# Patient Record
Sex: Female | Born: 1980
Health system: Southern US, Community
[De-identification: ages and names within clinical notes are randomized; demographics above are authoritative.]

## PROBLEM LIST (undated history)

## (undated) DIAGNOSIS — K760 Fatty (change of) liver, not elsewhere classified: Secondary | ICD-10-CM

## (undated) DIAGNOSIS — K219 Gastro-esophageal reflux disease without esophagitis: Secondary | ICD-10-CM

## (undated) DIAGNOSIS — F1190 Opioid use, unspecified, uncomplicated: Secondary | ICD-10-CM

## (undated) DIAGNOSIS — F429 Obsessive-compulsive disorder, unspecified: Secondary | ICD-10-CM

## (undated) DIAGNOSIS — F411 Generalized anxiety disorder: Secondary | ICD-10-CM

## (undated) DIAGNOSIS — F419 Anxiety disorder, unspecified: Secondary | ICD-10-CM

## (undated) DIAGNOSIS — L409 Psoriasis, unspecified: Secondary | ICD-10-CM

## (undated) DIAGNOSIS — F410 Panic disorder [episodic paroxysmal anxiety] without agoraphobia: Secondary | ICD-10-CM

## (undated) DIAGNOSIS — F09 Unspecified mental disorder due to known physiological condition: Secondary | ICD-10-CM

## (undated) DIAGNOSIS — Z8619 Personal history of other infectious and parasitic diseases: Secondary | ICD-10-CM

## (undated) DIAGNOSIS — M21371 Foot drop, right foot: Secondary | ICD-10-CM

## (undated) DIAGNOSIS — D649 Anemia, unspecified: Secondary | ICD-10-CM

## (undated) DIAGNOSIS — R002 Palpitations: Secondary | ICD-10-CM

## (undated) DIAGNOSIS — G8929 Other chronic pain: Secondary | ICD-10-CM

## (undated) DIAGNOSIS — G629 Polyneuropathy, unspecified: Secondary | ICD-10-CM

## (undated) DIAGNOSIS — Z8616 Personal history of COVID-19: Secondary | ICD-10-CM

## (undated) DIAGNOSIS — K8020 Calculus of gallbladder without cholecystitis without obstruction: Secondary | ICD-10-CM

## (undated) DIAGNOSIS — K811 Chronic cholecystitis: Secondary | ICD-10-CM

## (undated) DIAGNOSIS — Z972 Presence of dental prosthetic device (complete) (partial): Secondary | ICD-10-CM

## (undated) DIAGNOSIS — F319 Bipolar disorder, unspecified: Secondary | ICD-10-CM

## (undated) DIAGNOSIS — R87629 Unspecified abnormal cytological findings in specimens from vagina: Secondary | ICD-10-CM

## (undated) DIAGNOSIS — J45909 Unspecified asthma, uncomplicated: Secondary | ICD-10-CM

## (undated) DIAGNOSIS — Z8742 Personal history of other diseases of the female genital tract: Secondary | ICD-10-CM

## (undated) DIAGNOSIS — R29898 Other symptoms and signs involving the musculoskeletal system: Secondary | ICD-10-CM

## (undated) DIAGNOSIS — R5382 Chronic fatigue, unspecified: Secondary | ICD-10-CM

## (undated) DIAGNOSIS — R600 Localized edema: Secondary | ICD-10-CM

## (undated) DIAGNOSIS — R0609 Other forms of dyspnea: Secondary | ICD-10-CM

## (undated) DIAGNOSIS — F4310 Post-traumatic stress disorder, unspecified: Secondary | ICD-10-CM

## (undated) DIAGNOSIS — R269 Unspecified abnormalities of gait and mobility: Secondary | ICD-10-CM

## (undated) DIAGNOSIS — R32 Unspecified urinary incontinence: Secondary | ICD-10-CM

## (undated) DIAGNOSIS — G709 Myoneural disorder, unspecified: Secondary | ICD-10-CM

## (undated) DIAGNOSIS — Z8659 Personal history of other mental and behavioral disorders: Secondary | ICD-10-CM

## (undated) DIAGNOSIS — R519 Headache, unspecified: Secondary | ICD-10-CM

## (undated) DIAGNOSIS — J189 Pneumonia, unspecified organism: Secondary | ICD-10-CM

## (undated) DIAGNOSIS — U071 COVID-19: Secondary | ICD-10-CM

## (undated) DIAGNOSIS — T8859XA Other complications of anesthesia, initial encounter: Secondary | ICD-10-CM

## (undated) DIAGNOSIS — Z993 Dependence on wheelchair: Secondary | ICD-10-CM

## (undated) DIAGNOSIS — K5909 Other constipation: Secondary | ICD-10-CM

## (undated) DIAGNOSIS — Z8489 Family history of other specified conditions: Secondary | ICD-10-CM

## (undated) DIAGNOSIS — J683 Other acute and subacute respiratory conditions due to chemicals, gases, fumes and vapors: Secondary | ICD-10-CM

## (undated) DIAGNOSIS — F1120 Opioid dependence, uncomplicated: Secondary | ICD-10-CM

## (undated) HISTORY — PX: DILATION AND CURETTAGE OF UTERUS: SHX78

## (undated) HISTORY — DX: Personal history of other infectious and parasitic diseases: Z86.19

## (undated) HISTORY — DX: Other acute and subacute respiratory conditions due to chemicals, gases, fumes and vapors: J68.3

## (undated) HISTORY — DX: Opioid dependence, uncomplicated: F11.20

## (undated) HISTORY — DX: Unspecified abnormal cytological findings in specimens from vagina: R87.629

## (undated) HISTORY — DX: Personal history of COVID-19: Z86.16

## (undated) HISTORY — PX: COLPOSCOPY: SHX161

## (undated) HISTORY — DX: Foot drop, right foot: M21.371

## (undated) HISTORY — PX: OTHER SURGICAL HISTORY: SHX169

## (undated) HISTORY — DX: Psoriasis, unspecified: L40.9

---
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
## 2001-05-12 ENCOUNTER — Other Ambulatory Visit: Admission: RE | Admit: 2001-05-12 | Discharge: 2001-05-12 | Payer: Self-pay | Admitting: Obstetrics and Gynecology

## 2001-07-24 ENCOUNTER — Ambulatory Visit (HOSPITAL_COMMUNITY): Admission: RE | Admit: 2001-07-24 | Discharge: 2001-07-24 | Payer: Self-pay | Admitting: Family Medicine

## 2001-07-24 ENCOUNTER — Encounter: Payer: Self-pay | Admitting: Family Medicine

## 2002-12-17 ENCOUNTER — Encounter: Payer: Self-pay | Admitting: Obstetrics and Gynecology

## 2002-12-17 ENCOUNTER — Ambulatory Visit (HOSPITAL_COMMUNITY): Admission: RE | Admit: 2002-12-17 | Discharge: 2002-12-17 | Payer: Self-pay | Admitting: Obstetrics and Gynecology

## 2003-08-24 ENCOUNTER — Ambulatory Visit (HOSPITAL_COMMUNITY): Admission: AD | Admit: 2003-08-24 | Discharge: 2003-08-24 | Payer: Self-pay | Admitting: Obstetrics and Gynecology

## 2003-09-26 ENCOUNTER — Inpatient Hospital Stay (HOSPITAL_COMMUNITY): Admission: RE | Admit: 2003-09-26 | Discharge: 2003-09-28 | Payer: Self-pay | Admitting: Obstetrics and Gynecology

## 2005-12-12 ENCOUNTER — Emergency Department (HOSPITAL_COMMUNITY): Admission: EM | Admit: 2005-12-12 | Discharge: 2005-12-12 | Payer: Self-pay | Admitting: Emergency Medicine

## 2007-04-23 ENCOUNTER — Emergency Department (HOSPITAL_COMMUNITY): Admission: EM | Admit: 2007-04-23 | Discharge: 2007-04-23 | Payer: Self-pay | Admitting: Emergency Medicine

## 2007-04-25 ENCOUNTER — Ambulatory Visit (HOSPITAL_COMMUNITY): Admission: RE | Admit: 2007-04-25 | Discharge: 2007-04-25 | Payer: Self-pay | Admitting: Obstetrics & Gynecology

## 2007-04-25 ENCOUNTER — Encounter: Payer: Self-pay | Admitting: Obstetrics & Gynecology

## 2007-06-27 ENCOUNTER — Other Ambulatory Visit: Admission: RE | Admit: 2007-06-27 | Discharge: 2007-06-27 | Payer: Self-pay | Admitting: Obstetrics & Gynecology

## 2007-08-17 ENCOUNTER — Emergency Department (HOSPITAL_COMMUNITY): Admission: EM | Admit: 2007-08-17 | Discharge: 2007-08-18 | Payer: Self-pay | Admitting: Emergency Medicine

## 2008-08-14 ENCOUNTER — Emergency Department (HOSPITAL_COMMUNITY): Admission: EM | Admit: 2008-08-14 | Discharge: 2008-08-14 | Payer: Self-pay | Admitting: Emergency Medicine

## 2008-10-07 ENCOUNTER — Emergency Department (HOSPITAL_COMMUNITY): Admission: EM | Admit: 2008-10-07 | Discharge: 2008-10-07 | Payer: Self-pay | Admitting: Emergency Medicine

## 2009-07-12 ENCOUNTER — Emergency Department (HOSPITAL_COMMUNITY): Admission: EM | Admit: 2009-07-12 | Discharge: 2009-07-12 | Payer: Self-pay | Admitting: Emergency Medicine

## 2009-09-22 ENCOUNTER — Other Ambulatory Visit: Admission: RE | Admit: 2009-09-22 | Discharge: 2009-09-22 | Payer: Self-pay | Admitting: Obstetrics and Gynecology

## 2009-12-09 ENCOUNTER — Emergency Department (HOSPITAL_COMMUNITY): Admission: EM | Admit: 2009-12-09 | Discharge: 2009-12-09 | Payer: Self-pay | Admitting: Emergency Medicine

## 2010-05-06 LAB — MONONUCLEOSIS SCREEN: Mono Screen: NEGATIVE

## 2010-05-06 LAB — RAPID STREP SCREEN (MED CTR MEBANE ONLY): Streptococcus, Group A Screen (Direct): POSITIVE — AB

## 2010-05-11 LAB — URINALYSIS, ROUTINE W REFLEX MICROSCOPIC
Glucose, UA: 250 mg/dL — AB
Nitrite: POSITIVE — AB
Protein, ur: >300 mg/dL — AB
Specific Gravity, Urine: 1.02 (ref 1.005–1.030)
Urobilinogen, UA: >8 mg/dL — ABNORMAL HIGH (ref 0.0–1.0)
pH: 5 (ref 5.0–8.0)

## 2010-05-11 LAB — POCT PREGNANCY, URINE: Preg Test, Ur: NEGATIVE

## 2010-05-11 LAB — URINE MICROSCOPIC-ADD ON

## 2010-05-30 LAB — URINALYSIS, ROUTINE W REFLEX MICROSCOPIC
Bilirubin Urine: NEGATIVE
Glucose, UA: NEGATIVE mg/dL
Ketones, ur: NEGATIVE mg/dL
Leukocytes, UA: NEGATIVE
Nitrite: POSITIVE — AB
Protein, ur: NEGATIVE mg/dL
Specific Gravity, Urine: 1.025 (ref 1.005–1.030)
Urobilinogen, UA: 0.2 mg/dL (ref 0.0–1.0)
pH: 6 (ref 5.0–8.0)

## 2010-05-30 LAB — URINE MICROSCOPIC-ADD ON

## 2010-05-30 LAB — PREGNANCY, URINE: Preg Test, Ur: NEGATIVE

## 2010-06-01 LAB — PREGNANCY, URINE: Preg Test, Ur: NEGATIVE

## 2010-06-01 LAB — RAPID STREP SCREEN (MED CTR MEBANE ONLY): Streptococcus, Group A Screen (Direct): NEGATIVE

## 2010-07-07 NOTE — Consult Note (Signed)
Belinda [name], COPENHAVER NO.:  [id number]   MEDICAL RECORD NO.:  [id number]          PATIENT TYPE:  AMB   LOCATION:  DAY                           FACILITY:  [facility]   PHYSICIAN:  Jonnie Kind, M.D. DATE OF BIRTH:  Aug 23, 1980   DATE OF CONSULTATION:  DATE OF DISCHARGE:                                 CONSULTATION   CHIEF COMPLAINT:  Cramping, possible miscarriage.   This 30 year old female gravida 4, para 2-0-1-2 at approximately [redacted] weeks  gestation has undergone a failed RDU486 therapy elsewhere and has had  ultrasound in our office that determined that she has a missed abortion  at this point, with the gestational sac still inside the uterus and no  fetal pole on a 5-mm fetal body.  Missed abortion was diagnosed, and she  was scheduled for a suction D&C in 2 days.  She presents to the  emergency room complaining of continued bleeding and cramping.  The  patient has a highly anxious personality and is tolerating the cramping  and discomfort very poorly despite Vicodin ES 1 every 6 hours for which  she has an active prescription.   PAST MEDICAL HISTORY:  Positive for asthma.   PAST SURGICAL HISTORY:  C-section x2.   ALLERGIES:  NONE KNOWN.   PHYSICAL EXAMINATION:  VITAL SIGNS:  Blood pressure 103/66, pulse 87,  respirations 20, temperature 97.3.  GENERAL:  A highly anxious talkative Caucasian female, alert and  oriented x3, who complains of extreme pain but is completely active in  conversation and whose body language does not match her described  complaints.  ABDOMEN:  Nontender.  PELVIC:  External genitalia normal with tampon in place removed  revealing a tiny bit of blood.  There was almost no blood on the tampon  which had been in greater than an hour.  The uterus was anteflexed,  slightly enlarged.   The patient had an ultrasound from 2 days ago which shows a gestational  sac with a tiny fetal pole without fetal heart motion.  She has  paperwork for  the suction D&C.  We have reviewed her information sheets  from the [facility] that she attended and she had received RDU486  injections and had been given a prescription for Cytotec (misoprostol)  100 mcg tablets x4 to be taken once 1 week after her original RDU486  injection.  A phone call was made to the after hours number to confirm  the dosage and the treatment regimen.  We will therefore treat the  patient as follows.  The patient is given her options and declines to  proceed and wishes to avoid D&C if possible.  Therefore, she is going to  be given a prescription for 4 additional tablets of Cytotec 100 mcg to  get filled in the morning and take 2 tablets immediately and 2 six hours  later to try to induce medical evacuation of the uterus of nonviable  products of conception.  The patient still will be kept with her  appointment at 2 o'clock Monday for preliminary set up for [facility] on  Tuesday.  The  patient declines offer, [facility] as the [facility] offered.   LABORATORY DATA:  Reviewed lab results here, including hemoglobin 13.2,  hematocrit 38, white count 6100.  Blood type A positive.   IMPRESSION:  Missed abortion, already scheduled for suction dilatation  and curettage in 2 days.   PLAN:  As mentioned above.      Jonnie Kind, M.D.  Electronically Signed     JVF/MEDQ  D:  04/23/2007  T:  04/24/2007  Job:  [id number]

## 2010-07-07 NOTE — Op Note (Signed)
Belinda [name], Belinda [name]              ACCOUNT NO.:  [id number]   MEDICAL RECORD NO.:  [id number]          PATIENT TYPE:  AMB   LOCATION:  DAY                           FACILITY:  [facility]   PHYSICIAN:  Florian Buff, M.D.   DATE OF BIRTH:  1980-08-25   DATE OF PROCEDURE:  04/25/2007  DATE OF DISCHARGE:                               OPERATIVE REPORT   PREOPERATIVE DIAGNOSES:  1. Missed abortion in the first trimester.  2. Failed elective termination attempt, pharmacologically a month ago.   POSTOPERATIVE DIAGNOSES:  1. Missed abortion in the first trimester.  2. Failed elective termination attempt, pharmacologically a month ago.   PROCEDURE:  Cervical dilation with suction and sharp uterine curettage.   SURGEON:  Florian Buff, MD   ANESTHESIA:  General endotracheal.   FINDINGS:  The patient had been seen in a clinic in [place] 4-5 weeks  ago and elected to undergo pharmacological attempted termination without  success evidently because she came into our office last week bleeding  and having pain.  Her cervix was not open.  She really was not bleeding  when we saw her.  We did an ultrasound which revealed about a 6-7 week  intrauterine pregnancy with no fetal cardiac activity, and a large,  irregular sac.  She was quite tender, so I was concerned that she may be  infected, so I placed her on Levaquin and made plans to do a D and C  today.  Over the weekend she had more cramping and was actually seen in  the ER by Dr. Glo Herring, but nothing else was really going on.   DESCRIPTION OF OPERATION:  The patient was taken to the operating room  and placed in supine position, underwent general endotracheal  anesthesia.  Placed in lithotomy position.  Prepped and draped in the  usual sterile fashion.  Cervix was grasped.  Cervix was dilated serially  to allow passage of an 8 curved French suction curet.  Multiple passes  were made.  A moderate amount of tissue was returned.  Minimal  bleeding  resulted.  All the POCs were felt to have been removed.  I then did a  general sharp curettage with good uterine cry in all areas.  An  additional suction curettage was performed, passed x1.   The patient was awakened from anesthesia, taken to the recovery room in  good, stable condition.  All counts were correct.  There was some  minimal bleeding.  She was discharged to home on Toradol.  She received  Ancef prophylactically.  She was seen in the office in 2 weeks.  She  will finish out her Levaquin, which she was prescribed last week.      Florian Buff, M.D.  Electronically Signed     LHE/MEDQ  D:  04/25/2007  T:  04/25/2007  Job:  [id number]

## 2010-07-10 NOTE — H&P (Signed)
NAME:  Belinda [name], Belinda [name]                        ACCOUNT NO.:  [id number]   MEDICAL RECORD NO.:  [id number]                   PATIENT TYPE:  AMB   LOCATION:  DAY                                  FACILITY:  [facility]   PHYSICIAN:  Jonnie Kind, M.D.              DATE OF BIRTH:  Nov 09, 1980   DATE OF ADMISSION:  09/26/2003  DATE OF DISCHARGE:                                HISTORY & PHYSICAL   ADMISSION DIAGNOSES:  1. Pregnancy at [redacted] weeks gestation.  2. Previous cesarean section, not for trial of labor.   HISTORY OF PRESENT ILLNESS:  This 30 year old female, gravida 2, para 1, AB  0, is requesting repeat cesarean section and is currently scheduled for  September 26, 2003, at 38 weeks 6 days.  Prenatal course has been followed  through our office with uncomplicated prenatal course through 12 prenatal  visits with appropriate weight gain and fundal height growth.   PAST MEDICAL HISTORY:  PID in 1999.   PAST SURGICAL HISTORY:  Cesarean section in 2000 for CPD.   HABITS:  Cigarettes, alcohol, recreational drugs:  None.   SOCIAL HISTORY:  Married.   PHYSICAL EXAMINATION:  GENERAL:  Healthy appearing Caucasian female, alert  and oriented x 3.  HEENT:  Pupils equal, round, and reactive.  Extraocular movements intact.  CARDIOVASCULAR:  Exam unremarkable.  ABDOMEN:  38 cm fundal height.  Fetal heart rate in the 140s.  Vertex  presentation.   PRENATAL LABORATORY DATA:  Blood type A positive, rubella immune.  Hemoglobin 13, hematocrit 38.  Hepatitis negative, HIV negative, HSV-1  positive, HSV-2 negative, RPR nonreactive.  Pap smear negative.  MSAFP  normal at 1 in 6600.  Glucose tolerance test 116 mg percent.   The patient does NOT plan to have permanent sterilization at this time.  She  wishes to delay this delay this until the baby is 75 months of age and  reconsider at that time.   PLAN:  Repeat cesarean section on the morning of September 26, 2003.     ___________________________________________                                         Jonnie Kind, M.D.   JVF/MEDQ  D:  09/25/2003  T:  09/25/2003  Job:  [id number]   cc:   Micah Flesher. Halm, D.O.  [address], [place] [address]  Fax: [phone]

## 2010-07-10 NOTE — Discharge Summary (Signed)
NAME:  Belinda [name], Belinda [name]                        ACCOUNT NO.:  [id number]   MEDICAL RECORD NO.:  [id number]                   PATIENT TYPE:  INP   LOCATION:  [id number]                                 FACILITY:  [facility]   PHYSICIAN:  Jonnie Kind, M.D.              DATE OF BIRTH:  12-30-1980   DATE OF ADMISSION:  09/26/2003  DATE OF DISCHARGE:  09/28/2003                                 DISCHARGE SUMMARY   ADMISSION DIAGNOSES:  1. Pregnancy at [redacted] weeks gestation.  2. Prior cesarean section, not for trial of labor.  3. Obesity with abdominal wall laxity.   DISCHARGE DIAGNOSES:  1. Pregnancy at [redacted] weeks gestation, delivered.  2. Prior cesarean section, not for trial of labor.  3. Obesity with abdominal wall laxity.  4. Postoperative anemia, mild.   PROCEDURE:  Repeat low transverse cervical cesarean section, wide excision  of secretory (partial panniculectomy).   DISCHARGE MEDICATIONS:  1. Tylox one to two q.4h. p.r.n. pain, dispense 30.  2. Chromagen Forte 60 tablets one p.o. b.i.d. x1 month for anemia.   FOLLOWUP:  Follow up in one week for staple removal.   HISTORY:  This is a 30 year old female, gravida 2, para 1, AB 0, is admitted  for repeat cesarean section as described in the admitting history.  The  patient concerns were very emphatic regarding her abdominal wall laxity.  She had extensive stretch marks and abdominal stretching from her first  pregnancy that had been worsened by the second, and she has a chronic crease  at the site of the old ____________scar which is a moisture problem and  unsightly.   DESCRIPTION OF PROCEDURE:  The patient was taken to the operating room and a  repeat cesarean section was performed with a wide excision of the  ____________, extending the incision to approximately 30 cm in length,  taking out a greater than 10 cm ellipse of skin and connective tissue,  significantly improving the midline contours.  This included a reduction of  stretched  laxity of the fascia, removing approximately a  2 cm strip of the  fascia above the fascial incision.   HISTORY:  Postoperatively, the patient did quite well, had a Jackson-Pratt  drain for two days.  She tolerated a regular diet, had her JP drain removed  on the second day.  She had a postoperative hemoglobin of 9.9, hematocrit of  28, compared to preoperative values of 10.7 and 31.  Cord blood gas was  obtained which showed a pH of 7.28,  PCO2 of 57, PO2 of 20, normal for newborn.  Postoperatively, the patient's  care was notable for pain, not unexpected from this patient, but  satisfactory overall care until discharge.  Follow up in one week for staple  removal.     ___________________________________________  Jonnie Kind, M.D.   JVF/MEDQ  D:  09/28/2003  T:  09/28/2003  Job:  [id number]   cc:   Nicki Reaper A. Wolfgang Phoenix, M.D.  [address] [place]  [place] [address]  Fax: [phone]

## 2010-07-10 NOTE — Op Note (Signed)
NAME:  Belinda [name], Belinda [name]                        ACCOUNT NO.:  [id number]   MEDICAL RECORD NO.:  [id number]                   PATIENT TYPE:  INP   LOCATION:  [id number]                                 FACILITY:  [facility]   PHYSICIAN:  Jonnie Kind, M.D.              DATE OF BIRTH:  1980/09/03   DATE OF PROCEDURE:  DATE OF DISCHARGE:  09/28/2003                                 OPERATIVE REPORT   PROCEDURE:  1. Repeat low transverse cervical Cesarean section.  2. Wide excision of cicatrix (partial panniculectomy).   PREOPERATIVE DIAGNOSES:  1. Pregnancy 39 weeks, prior Cesarean section, now for trial of labor.  2. Obesity with abdominal wall laxity.   POSTOPERATIVE DIAGNOSES:  1. Pregnancy 39 weeks, prior Cesarean section, now for trial of labor.  2. Obesity with abdominal wall laxity.   SURGEON:  Jonnie Kind, M.D.   ANESTHESIA:  Spinal.   COMPLICATIONS:  None.   ESTIMATED BLOOD LOSS:  700 cc.   FINDINGS:  8-pound, 8-ounce infant.   DETAILS OF THE PROCEDURE:  The patient was taken to the operating room,  prepped and draped for lower abdominal surgery. The old cicatrix had been  marked in the preoperative area to remove a generous portion of the  redundant skin in the abdominal wall on our way in to the baby. The incision  was marked. The patient had been taken to labor and delivery, and non-stress  test showed reactive pattern.   After spinal anesthesia was introduced and the Pfannenstiel was repeated  widely extending from anterior superior iliac crest on each side to the  midline removing an ellipse of skin and underlying fatty tissue  approximately 50 cm in length, excising a 12-cm wide ellipse of skin and  underlying fatty tissue. The standard Pfannenstiel technique was then used  to develop the fascial opening, split the rectus muscles in the midline, and  enter the abdominal cavity anteriorly. Bladder flap was developed, and  transverse uterine incision performed,  extended laterally using index finger  traction and the infant delivered without difficulty. Apgars 9 and 9 were  assigned. See pediatrician's record for status of baby.   Cord was clamp. Cord blood samples were obtained, the placenta delivered by  Crede massage with intact placenta. Irrigation with antibiotic solution was  followed with a single layer running locking closure of the uterine  incision. Subsequently proceeded with uterine closure with continuous  running locking 0 chromic and then bladder flap was reapproximated with 2-0  chromic. The peritoneal cavity was closed with 2-0 chromic as well, the  muscles pulled together with 2-0 chromic interrupted. We then trimmed some  redundant irregular edges to the Pfannenstiel incision and then perclosed  the fascia area with continuous running 0 Vicryl.   Panniculectomy closure then consisted of a 2 layer closure of interrupted 2-  0 plain sutures. Two flat JP drains were placed in the depths of the  subcu  tissues and allowed to exit that through separate stab incisions lateral to  the ends of the abdominal wall incision. The skin edges were approximated  with staples. Sutures were used to attach the drain to the skin, and bulb  suctioning devices were attached to the drains. Pressure dressing was  applied, estimated blood loss 700 cc. The patient went to recovery room in  good condition.      ___________________________________________                                            Jonnie Kind, M.D.   JVF/MEDQ  D:  10/17/2003  T:  10/18/2003  Job:  [id number]

## 2010-10-06 ENCOUNTER — Other Ambulatory Visit (HOSPITAL_COMMUNITY)
Admission: RE | Admit: 2010-10-06 | Discharge: 2010-10-06 | Disposition: A | Discharge status: Home / Self Care | Payer: Medicaid Other | Source: Ambulatory Visit | Attending: Family Medicine | Admitting: Family Medicine

## 2010-10-06 ENCOUNTER — Other Ambulatory Visit: Payer: Self-pay | Admitting: Nurse Practitioner

## 2010-10-06 ENCOUNTER — Other Ambulatory Visit (HOSPITAL_COMMUNITY)
Admission: RE | Admit: 2010-10-06 | Discharge: 2010-10-06 | Disposition: A | Discharge status: Home / Self Care | Payer: Medicaid Other | Source: Ambulatory Visit | Attending: Unknown Physician Specialty | Admitting: Unknown Physician Specialty

## 2010-10-06 DIAGNOSIS — N870 Mild cervical dysplasia: Secondary | ICD-10-CM | POA: Insufficient documentation

## 2010-10-06 DIAGNOSIS — R87610 Atypical squamous cells of undetermined significance on cytologic smear of cervix (ASC-US): Secondary | ICD-10-CM | POA: Insufficient documentation

## 2010-11-16 LAB — DIFFERENTIAL
Band Neutrophils: 0
Basophils Absolute: 0
Basophils Relative: 0
Blasts: 0
Eosinophils Absolute: 0
Eosinophils Relative: 0
Lymphocytes Relative: 45
Lymphs Abs: 2.7
Metamyelocytes Relative: 0
Monocytes Absolute: 0.2
Monocytes Relative: 4
Myelocytes: 0
Neutro Abs: 3.2
Neutrophils Relative %: 51
Promyelocytes Absolute: 0
nRBC: 0

## 2010-11-16 LAB — CBC
HCT: 38
Hemoglobin: 13.2
MCHC: 34.8
MCV: 93.4
Platelets: 253
RBC: 4.07
RDW: 12.6
WBC: 6.1

## 2010-11-16 LAB — ABO/RH: ABO/RH(D): A POS

## 2010-11-16 LAB — HCG, QUANTITATIVE, PREGNANCY: hCG, Beta Chain, Quant, S: 5394 — ABNORMAL HIGH

## 2012-05-12 ENCOUNTER — Encounter: Payer: Self-pay | Admitting: *Deleted

## 2012-05-12 DIAGNOSIS — L409 Psoriasis, unspecified: Secondary | ICD-10-CM | POA: Insufficient documentation

## 2012-05-12 DIAGNOSIS — J45909 Unspecified asthma, uncomplicated: Secondary | ICD-10-CM

## 2012-05-17 ENCOUNTER — Ambulatory Visit (INDEPENDENT_AMBULATORY_CARE_PROVIDER_SITE_OTHER): Payer: BC Managed Care – PPO | Admitting: Family Medicine

## 2012-05-17 ENCOUNTER — Encounter: Payer: Self-pay | Admitting: Family Medicine

## 2012-05-17 VITALS — BP 130/84 | HR 70 | Ht 61.5 in | Wt 207.4 lb

## 2012-05-17 DIAGNOSIS — R635 Abnormal weight gain: Secondary | ICD-10-CM

## 2012-05-17 DIAGNOSIS — F411 Generalized anxiety disorder: Secondary | ICD-10-CM | POA: Insufficient documentation

## 2012-05-17 MED ORDER — PHENTERMINE HCL 37.5 MG PO TABS: 37.5000 mg | ORAL_TABLET | Freq: Every day | ORAL | Status: DC

## 2012-05-17 MED ORDER — ALPRAZOLAM 1 MG PO TABS: 1.0000 mg | ORAL_TABLET | Freq: Four times a day (QID) | ORAL | Status: DC | PRN

## 2012-05-17 NOTE — Patient Instructions (Signed)
Do not exceed one xanax maximum of 4 times in a day  Return in three months  1800 Calorie Diet for Diabetes Meal Planning The 1800 calorie diet is designed for eating up to 1800 calories each day. Following this diet and making healthy meal choices can help improve overall health. This diet controls blood sugar (glucose) levels and can also help lower blood pressure and cholesterol. SERVING SIZES Measuring foods and serving sizes helps to make sure you are getting the right amount of food. The list below tells how big or small some common serving sizes are:  1 oz.........4 stacked dice.  3 oz........Marland KitchenDeck of cards.  1 tsp.......Marland KitchenTip of little finger.  1 tbs......Marland KitchenMarland KitchenThumb.  2 tbs.......Marland KitchenGolf ball.   cup......Marland KitchenHalf of a fist.  1 cup.......Marland KitchenA fist. GUIDELINES FOR CHOOSING FOODS The goal of this diet is to eat a variety of foods and limit calories to 1800 each day. This can be done by choosing foods that are low in calories and fat. The diet also suggests eating small amounts of food frequently. Doing this helps control your blood glucose levels so they do not get too high or too low. Each meal or snack may include a protein food source to help you feel more satisfied and to stabilize your blood glucose. Try to eat about the same amount of food around the same time each day. This includes weekend days, travel days, and days off work. Space your meals about 4 to 5 hours apart and add a snack between them if you wish.  For example, a daily food plan could include breakfast, a morning snack, lunch, dinner, and an evening snack. Healthy meals and snacks include whole grains, vegetables, fruits, lean meats, poultry, fish, and dairy products. As you plan your meals, select a variety of foods. Choose from the bread and starch, vegetable, fruit, dairy, and meat/protein groups. Examples of foods from each group and their suggested serving sizes are listed below. Use measuring cups and spoons to become  familiar with what a healthy portion looks like. Bread and Starch Each serving equals 15 grams of carbohydrates.  1 slice bread.   bagel.   cup cold cereal (unsweetened).   cup hot cereal or mashed potatoes.  1 small potato (size of a computer mouse).   cup cooked pasta or rice.   English muffin.  1 cup broth-based soup.  3 cups of popcorn.  4 to 6 whole-wheat crackers.   cup cooked beans, peas, or corn. Vegetable Each serving equals 5 grams of carbohydrates.   cup cooked vegetables.  1 cup raw vegetables.   cup tomato or vegetable juice. Fruit Each serving equals 15 grams of carbohydrates.  1 small apple or orange.  1 cup watermelon or strawberries.   cup applesauce (no sugar added).  2 tbs raisins.   banana.   cup canned fruit, packed in water, its own juice, or sweetened with a sugar substitute.   cup unsweetened fruit juice. Dairy Each serving equals 12 to 15 grams of carbohydrates.  1 cup fat-free milk.  6 oz artificially sweetened yogurt or plain yogurt.  1 cup low-fat buttermilk.  1 cup soy milk.  1 cup almond milk. Meat/Protein  1 large egg.  2 to 3 oz meat, poultry, or fish.   cup low-fat cottage cheese.  1 tbs peanut butter.  1 oz low-fat cheese.   cup tuna in water.   cup tofu. Fat  1 tsp oil.  1 tsp trans-fat-free margarine.  1 tsp butter.  1 tsp mayonnaise.  2 tbs avocado.  1 tbs salad dressing.  1 tbs cream cheese.  2 tbs sour cream. SAMPLE 1800 CALORIE DIET PLAN Breakfast   cup unsweetened cereal (1 carb serving).  1 cup fat-free milk (1 carb serving).  1 slice whole-wheat toast (1 carb serving).   small banana (1 carb serving).  1 scrambled egg.  1 tsp trans-fat-free margarine. Lunch  Tuna sandwich.  2 slices whole-wheat bread (2 carb servings).   cup canned tuna in water, drained.  1 tbs reduced fat mayonnaise.  1 stalk celery, chopped.  2 slices tomato.  1  lettuce leaf.  1 cup carrot sticks.  24 to 30 seedless grapes (2 carb servings).  6 oz light yogurt (1 carb serving). Afternoon Snack  3 graham cracker squares (1 carb serving).  Fat-free milk, 1 cup (1 carb serving).  1 tbs peanut butter. Dinner  3 oz salmon, broiled with 1 tsp oil.  1 cup mashed potatoes (2 carb servings) with 1 tsp trans-fat-free margarine.  1 cup fresh or frozen green beans.  1 cup steamed asparagus.  1 cup fat-free milk (1 carb serving). Evening Snack  3 cups air-popped popcorn (1 carb serving).  2 tbs parmesan cheese sprinkled on top. MEAL PLAN Use this worksheet to help you make a daily meal plan based on the 1800 calorie diet suggestions. If you are using this plan to help you control your blood glucose, you may interchange carbohydrate-containing foods (dairy, starches, and fruits). Select a variety of fresh foods of varying colors and flavors. The total amount of carbohydrate in your meals or snacks is more important than making sure you include all of the food groups every time you eat. Choose from the following foods to build your day's meals:  8 Starches.  4 Vegetables.  3 Fruits.  2 Dairy.  6 to 7 oz Meat/Protein.  Up to 4 Fats. Your dietician can use this worksheet to help you decide how many servings and which types of foods are right for you. BREAKFAST Food Group and Servings / Food Choice Starch ________________________________________________________ Dairy _________________________________________________________ Fruit _________________________________________________________ Meat/Protein __________________________________________________ Fat ___________________________________________________________ LUNCH Food Group and Servings / Food Choice Starch ________________________________________________________ Meat/Protein __________________________________________________ Vegetable  _____________________________________________________ Fruit _________________________________________________________ Dairy _________________________________________________________ Fat ___________________________________________________________ Belinda [name] Food Group and Servings / Food Choice Starch ________________________________________________________ Meat/Protein __________________________________________________ Fruit __________________________________________________________ Dairy _________________________________________________________ Belinda [name] Food Group and Servings / Food Choice Starch _________________________________________________________ Meat/Protein ___________________________________________________ Dairy __________________________________________________________ Vegetable ______________________________________________________ Fruit ___________________________________________________________ Fat ____________________________________________________________ Belinda [name] Food Group and Servings / Food Choice Fruit __________________________________________________________ Meat/Protein ___________________________________________________ Dairy __________________________________________________________ Starch _________________________________________________________ DAILY TOTALS Starch ____________________________ Vegetable _________________________ Fruit _____________________________ Dairy _____________________________ Meat/Protein______________________ Fat _______________________________ Document Released: 08/31/2004 Document Revised: 05/03/2011 Document Reviewed: 12/25/2010 ExitCare Patient Information 2013 [place], [place]. Diabetes Meal Planning Guide The diabetes meal planning guide is a tool to help you plan your meals and snacks. It is important for people with diabetes to manage their blood glucose (sugar) levels. Choosing the right foods and the right  amounts throughout your day will help control your blood glucose. Eating right can even help you improve your blood pressure and reach or maintain a healthy weight. CARBOHYDRATE COUNTING MADE EASY When you eat carbohydrates, they turn to sugar. This raises your blood glucose level. Counting carbohydrates can help you control this level so you feel better. When you plan your meals by counting carbohydrates, you can have more flexibility in what you eat and balance your medicine with your food intake. Carbohydrate counting simply means adding  up the total amount of carbohydrate grams in your meals and snacks. Try to eat about the same amount at each meal. Foods with carbohydrates are listed below. Each portion below is 1 carbohydrate serving or 15 grams of carbohydrates. Ask your dietician how many grams of carbohydrates you should eat at each meal or snack. Grains and Starches  1 slice bread.   English muffin or hotdog/hamburger bun.   cup cold cereal (unsweetened).   cup cooked pasta or rice.   cup starchy vegetables (corn, potatoes, peas, beans, winter squash).  1 tortilla (6 inches).   bagel.  1 waffle or pancake (size of a CD).   cup cooked cereal.  4 to 6 small crackers. *Whole grain is recommended. Fruit  1 cup fresh unsweetened berries, melon, papaya, pineapple.  1 small fresh fruit.   banana or mango.   cup fruit juice (4 oz unsweetened).   cup canned fruit in natural juice or water.  2 tbs dried fruit.  12 to 15 grapes or cherries. Milk and Yogurt  1 cup fat-free or 1% milk.  1 cup soy milk.  6 oz light yogurt with sugar-free sweetener.  6 oz low-fat soy yogurt.  6 oz plain yogurt. Vegetables  1 cup raw or  cup cooked is counted as 0 carbohydrates or a "free" food.  If you eat 3 or more servings at 1 meal, count them as 1 carbohydrate serving. Other Carbohydrates   oz chips or pretzels.   cup ice cream or frozen yogurt.   cup sherbet  or sorbet.  2 inch square cake, no frosting.  1 tbs honey, sugar, jam, jelly, or syrup.  2 small cookies.  3 squares of graham crackers.  3 cups popcorn.  6 crackers.  1 cup broth-based soup.  Count 1 cup casserole or other mixed foods as 2 carbohydrate servings.  Foods with less than 20 calories in a serving may be counted as 0 carbohydrates or a "free" food. You may want to purchase a book or computer software that lists the carbohydrate gram counts of different foods. In addition, the nutrition facts panel on the labels of the foods you eat are a good source of this information. The label will tell you how big the serving size is and the total number of carbohydrate grams you will be eating per serving. Divide this number by 15 to obtain the number of carbohydrate servings in a portion. Remember, 1 carbohydrate serving equals 15 grams of carbohydrate. SERVING SIZES Measuring foods and serving sizes helps you make sure you are getting the right amount of food. The list below tells how big or small some common serving sizes are.  1 oz.........4 stacked dice.  3 oz........Marland KitchenDeck of cards.  1 tsp.......Marland KitchenTip of little finger.  1 tbs......Marland KitchenMarland KitchenThumb.  2 tbs.......Marland KitchenGolf ball.   cup......Marland KitchenHalf of a fist.  1 cup.......Marland KitchenA fist. SAMPLE DIABETES MEAL PLAN Below is a sample meal plan that includes foods from the grain and starches, dairy, vegetable, fruit, and meat groups. A dietician can individualize a meal plan to fit your calorie needs and tell you the number of servings needed from each food group. However, controlling the total amount of carbohydrates in your meal or snack is more important than making sure you include all of the food groups at every meal. You may interchange carbohydrate containing foods (dairy, starches, and fruits). The meal plan below is an example of a 2000 calorie diet using carbohydrate counting. This meal plan has 17 carbohydrate servings.  Breakfast  1 cup  oatmeal (2 carb servings).   cup light yogurt (1 carb serving).  1 cup blueberries (1 carb serving).   cup almonds. Snack  1 large apple (2 carb servings).  1 low-fat string cheese stick. Lunch  Chicken breast salad.  1 cup spinach.   cup chopped tomatoes.  2 oz chicken breast, sliced.  2 tbs low-fat [place] dressing.  12 whole-wheat crackers (2 carb servings).  12 to 15 grapes (1 carb serving).  1 cup low-fat milk (1 carb serving). Snack  1 cup carrots.   cup hummus (1 carb serving). Dinner  3 oz broiled salmon.  1 cup brown rice (3 carb servings). Snack  1  cups steamed broccoli (1 carb serving) drizzled with 1 tsp olive oil and lemon juice.  1 cup light pudding (2 carb servings). DIABETES MEAL PLANNING WORKSHEET Your dietician can use this worksheet to help you decide how many servings of foods and what types of foods are right for you.  BREAKFAST Food Group and Servings / Carb Servings Grain/Starches __________________________________ Dairy __________________________________________ Vegetable ______________________________________ Fruit ___________________________________________ Meat __________________________________________ Fat ____________________________________________ LUNCH Food Group and Servings / Carb Servings Grain/Starches ___________________________________ Dairy ___________________________________________ Fruit ____________________________________________ Meat ___________________________________________ Fat _____________________________________________ Belinda [name] Food Group and Servings / Carb Servings Grain/Starches ___________________________________ Dairy ___________________________________________ Fruit ____________________________________________ Meat ___________________________________________ Fat _____________________________________________ SNACKS Food Group and Servings / Carb Servings Grain/Starches  ___________________________________ Dairy ___________________________________________ Vegetable _______________________________________ Fruit ____________________________________________ Meat ___________________________________________ Fat _____________________________________________ DAILY TOTALS Starches _________________________ Vegetable ________________________ Fruit ____________________________ Dairy ____________________________ Meat ____________________________ Fat ______________________________ Document Released: 11/05/2004 Document Revised: 05/03/2011 Document Reviewed: 09/16/2008 ExitCare Patient Information 2013 [place], [place].

## 2012-05-17 NOTE — Progress Notes (Signed)
Patient ID: Belinda [name], female   DOB: 09-Jun-1980, 32 y.o.   MRN: [id number] Patient comes in today with severe anxiety issues she describes at times having panic attacks she finds herself nervous every day she uses Xanax 1 mg tablets sometimes twice a day sometimes 4 times a day she denies headaches sweats chills. She denies being suicidal she denies being depressed she has tried several antidepressants in the past without any success. In addition to this she states the Xanax does do a good job taking care of her problem. She does relate to the eating more than she should has gained significant weight over the past year and a half. She denies excessive thirst or urination.  She would like to try Adipex 37.5 one each morning to try to help curb her appetite. In addition to this she relates that she is tolerating this well in the past and did not cause any problems and did not make her anxiety worse.  Past medical history depression resolved. Anxiety-present  Lungs are clear hearts regular vital signs notedabdomen moderately obese  A/P Anxiety-Xanax has prescribed 1 mg no greater than 4 times a day #123 refills followup in 3 months  #2 obesity-I recommended diabetic diet restricted calories exercise we will prescribe Adipex #30 tablets. One refill. We might give one additional refill but I told her this is not a long-term medicine.

## 2012-06-06 ENCOUNTER — Ambulatory Visit: Payer: Self-pay | Admitting: Family Medicine

## 2012-06-09 ENCOUNTER — Telehealth: Payer: Self-pay | Admitting: Family Medicine

## 2012-06-09 NOTE — Telephone Encounter (Signed)
See ov 05/17/12

## 2012-06-09 NOTE — Telephone Encounter (Signed)
wis

## 2012-06-09 NOTE — Telephone Encounter (Signed)
Med was faxed to [facility]. Patient notified

## 2012-06-09 NOTE — Telephone Encounter (Signed)
Patient would like a mini script of her xanax for the 26th of April to the 1st of may. She gets medicaid the first of may and she doesn't want to pay for a whole script until she gets medicaid.  [facility]

## 2012-06-12 ENCOUNTER — Telehealth: Payer: Self-pay | Admitting: Family Medicine

## 2012-06-12 NOTE — Telephone Encounter (Signed)
Pt would like to get the mini script for her adipex just like she did her xanax to be called into the [facility]

## 2012-06-12 NOTE — Telephone Encounter (Signed)
Alprazolam 68m #24 one qid prn 0Refills and addipex 37.5 #6 i po qd called into [facility] in [place] pt notified

## 2012-06-26 ENCOUNTER — Ambulatory Visit (INDEPENDENT_AMBULATORY_CARE_PROVIDER_SITE_OTHER): Payer: Medicaid Other | Admitting: Family Medicine

## 2012-06-26 ENCOUNTER — Encounter: Payer: Self-pay | Admitting: Family Medicine

## 2012-06-26 VITALS — BP 110/84 | HR 80 | Wt 193.6 lb

## 2012-06-26 DIAGNOSIS — L408 Other psoriasis: Secondary | ICD-10-CM

## 2012-06-26 DIAGNOSIS — L409 Psoriasis, unspecified: Secondary | ICD-10-CM

## 2012-06-26 DIAGNOSIS — R635 Abnormal weight gain: Secondary | ICD-10-CM

## 2012-06-26 MED ORDER — MOMETASONE FUROATE 0.1 % EX CREA: TOPICAL_CREAM | CUTANEOUS | Status: DC

## 2012-06-26 MED ORDER — PHENTERMINE HCL 37.5 MG PO TABS: 37.5000 mg | ORAL_TABLET | Freq: Every day | ORAL | Status: DC

## 2012-06-26 NOTE — Progress Notes (Signed)
  Subjective:    Patient ID: Belinda [name], female    DOB: 24-Jan-1981, 32 y.o.   MRN: [id number]  HPI Patient in with psoriasis multiple places she has tried using cream she even tried using phototherapy is seen to help some she would like to try a special medication but I don't believe that it will be covered by Medicaid unless other ones are covered first she also goes in multiple other areas discussing her nerves her weight loss and other issues she had to be redirected multiple times to discuss the problem she came in for which was psoriasis no family history of that. Personal history psoriasis.  dermazinc with clobetasol  Review of Systems No wheezing fevers cough vomiting diarrhea    Objective:   Physical Exam Multiple areas of psoriasis seemingly under good control currently lungs clear heart regular       Assessment & Plan:  Psoriasis - try elocon for 2 weeks, if doesn't help call then we will try to get clobetosol - dermazinc

## 2012-08-16 ENCOUNTER — Ambulatory Visit: Payer: Medicaid Other | Admitting: Family Medicine

## 2012-08-23 ENCOUNTER — Telehealth: Payer: Self-pay | Admitting: Family Medicine

## 2012-08-23 NOTE — Telephone Encounter (Signed)
Pt needs her xanax 91m 4x daily (has appt for 09/04/12 for recheck) she is out of this med as of today and needs it refilled as soon as possible. 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 She has missed her morning an lunch dose today. Pt had a no Show appt for this check up on 6/25 and states she had forgotten that appt. [place] ([facility] does not have the care card [facility]

## 2012-08-23 NOTE — Telephone Encounter (Signed)
14 days worth only. Tell her not to miss appts

## 2012-08-23 NOTE — Telephone Encounter (Signed)
Pt needs her xanax 44m 4x daily (has appt for 09/04/12 for recheck) she is out of this med as of today and needs it refilled as soon as possible. 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 She has missed her morning an lunch dose today. Pt had a no Show appt for this check up on 6/25 and states she had forgotten that appt. [place] ([facility] does not have the [facility] card [facility]

## 2012-08-23 NOTE — Telephone Encounter (Signed)
Called in 14 days worth only. Advised patient not to miss follow up visit. Patient verbalized understanding.

## 2012-09-04 ENCOUNTER — Ambulatory Visit: Payer: Medicaid Other | Admitting: Family Medicine

## 2012-09-04 ENCOUNTER — Encounter: Payer: Self-pay | Admitting: Family Medicine

## 2012-09-04 ENCOUNTER — Ambulatory Visit (INDEPENDENT_AMBULATORY_CARE_PROVIDER_SITE_OTHER): Payer: Medicaid Other | Admitting: Family Medicine

## 2012-09-04 VITALS — BP 112/86 | HR 70 | Wt 189.4 lb

## 2012-09-04 DIAGNOSIS — L74519 Primary focal hyperhidrosis, unspecified: Secondary | ICD-10-CM

## 2012-09-04 DIAGNOSIS — R635 Abnormal weight gain: Secondary | ICD-10-CM

## 2012-09-04 DIAGNOSIS — R61 Generalized hyperhidrosis: Secondary | ICD-10-CM

## 2012-09-04 DIAGNOSIS — F411 Generalized anxiety disorder: Secondary | ICD-10-CM

## 2012-09-04 MED ORDER — ALPRAZOLAM 1 MG PO TABS: 1.0000 mg | ORAL_TABLET | Freq: Four times a day (QID) | ORAL | Status: DC | PRN

## 2012-09-04 MED ORDER — PHENTERMINE HCL 37.5 MG PO TABS: 37.5000 mg | ORAL_TABLET | Freq: Every day | ORAL | Status: DC

## 2012-09-04 MED ORDER — AMOXICILLIN 500 MG PO TABS: 500.0000 mg | ORAL_TABLET | Freq: Three times a day (TID) | ORAL | Status: DC

## 2012-09-04 MED ORDER — ALUMINUM CHLORIDE 20 % EX SOLN: Freq: Every day | CUTANEOUS | Status: DC

## 2012-09-04 MED ORDER — BUPROPION HCL ER (XL) 150 MG PO TB24: 150.0000 mg | ORAL_TABLET | ORAL | Status: DC

## 2012-09-04 NOTE — Progress Notes (Signed)
  Subjective:    Patient ID: Belinda [name], female    DOB: [birth year], 32 y.o.   MRN: [id number]  HPI Patient states that she is here today for follow up on weight loss and discuss staying on phentermine. She states that she has no there concerns. Patient relates multiple different things and anxiety is bothering her Xanax is doing well but she would like to try Wellbutrin to see if that would help as for phentermine she has lost some weight she would like to stay on it she states she knew a friend his been taking it for a year. She denies any side effects with it. She does states she's trying to watch her diet and trying to exercise some. She denies being depressed. Xanax doing a good job controlling her symptoms. PMH anxiety has difficult social situation separated from her husband the husband has some spending problems as well his health problems  Review of Systems See above    Objective:   Physical Exam Lungs are clear heart is regular pulse normal blood pressure good weight is down a few pounds extremities no edema       Assessment & Plan:  Weight loss-phentermine reviewed with 3 refills not for long-term use this will be 6 months total on this medicine for this year I told the patient we're not comfortable with using more than 6 months out of 12 months Psoriasis-clobetasol with derma zinc called in to [place] issues continue Xanax as directed. 4 times daily 3 refills. Patient to followup in approximately 4 months and we'll renew the medicine at that Panic attacks-patient would like to try Wellbutrin to see if that would help 150 mg XL one daily if any problems followup sooner recheck 4 months patient was told that the medication causes any unusual side effects or if she starts feeling more depressed or other problems followup immediately

## 2012-09-04 NOTE — Patient Instructions (Addendum)
DASH Diet  The DASH diet stands for "Dietary Approaches to Stop Hypertension." It is a healthy eating plan that has been shown to reduce high blood pressure (hypertension) in as little as 14 days, while also possibly providing other significant health benefits. These other health benefits include reducing the risk of breast cancer after menopause and reducing the risk of type 2 diabetes, heart disease, colon cancer, and stroke. Health benefits also include weight loss and slowing kidney failure in patients with chronic kidney disease.   DIET GUIDELINES  · Limit salt (sodium). Your diet should contain less than 1500 mg of sodium daily.  · Limit refined or processed carbohydrates. Your diet should include mostly whole grains. Desserts and added sugars should be used sparingly.  · Include small amounts of heart-healthy fats. These types of fats include nuts, oils, and tub margarine. Limit saturated and trans fats. These fats have been shown to be harmful in the body.  CHOOSING FOODS   The following food groups are based on a 2000 calorie diet. See your Registered Dietitian for individual calorie needs.  Grains and Grain Products (6 to 8 servings daily)  · Eat More Often: Whole-wheat bread, brown rice, whole-grain or wheat pasta, quinoa, popcorn without added fat or salt (air popped).  · Eat Less Often: White bread, white pasta, white rice, cornbread.  Vegetables (4 to 5 servings daily)  · Eat More Often: Fresh, frozen, and canned vegetables. Vegetables may be raw, steamed, roasted, or grilled with a minimal amount of fat.  · Eat Less Often/Avoid: Creamed or fried vegetables. Vegetables in a cheese sauce.  Fruit (4 to 5 servings daily)  · Eat More Often: All fresh, canned (in natural juice), or frozen fruits. Dried fruits without added sugar. One hundred percent fruit juice (½ cup [237 mL] daily).  · Eat Less Often: Dried fruits with added sugar. Canned fruit in light or heavy syrup.  Lean Meats, Fish, and Poultry (2  servings or less daily. One serving is 3 to 4 oz [85-114 g]).  · Eat More Often: Ninety percent or leaner ground beef, tenderloin, sirloin. Round cuts of beef, chicken breast, turkey breast. All fish. Grill, bake, or broil your meat. Nothing should be fried.  · Eat Less Often/Avoid: Fatty cuts of meat, turkey, or chicken leg, thigh, or wing. Fried cuts of meat or fish.  Dairy (2 to 3 servings)  · Eat More Often: Low-fat or fat-free milk, low-fat plain or light yogurt, reduced-fat or part-skim cheese.  · Eat Less Often/Avoid: Milk (whole, 2%). Whole milk yogurt. Full-fat cheeses.  Nuts, Seeds, and Legumes (4 to 5 servings per week)  · Eat More Often: All without added salt.  · Eat Less Often/Avoid: Salted nuts and seeds, canned beans with added salt.  Fats and Sweets (limited)  · Eat More Often: Vegetable oils, tub margarines without trans fats, sugar-free gelatin. Mayonnaise and salad dressings.  · Eat Less Often/Avoid: Coconut oils, palm oils, butter, stick margarine, cream, half and half, cookies, candy, pie.  FOR MORE INFORMATION  The Dash Diet Eating Plan: www.dashdiet.org  Document Released: 01/28/2011 Document Revised: 05/03/2011 Document Reviewed: 01/28/2011  ExitCare® Patient Information ©2014 [employer].

## 2012-09-12 ENCOUNTER — Telehealth: Payer: Self-pay | Admitting: Family Medicine

## 2012-09-12 NOTE — Telephone Encounter (Signed)
Pt's Rx for Dermazinc Spray w/Clobetasol is NOT covered by Medicaid.  Pt states that you once told her you could explain to Medicaid that this works well controlling the pt's psoriasis.  I tried to explain to the patient that if Medicaid denies due to "product/service not covered" that I would be unable to get it approved.  She would like you to consider writing her Clobetasol lotion for her to use a thin layer on area.  Tannin bed is helping hold down break out but pt does not want to continue to tan due to risk of skin cancer.  Pt wonders if she can use Dermazinc over the counter along with the Clobetasol lotion.  Please advise.  Also note that Clobetasol lotion is a non preferred on Medicaid's PDL but I can get covered if pt's failed the 2 preferreds.  Please see copy of PDL on pt's paper chart and please advise.  Please call pt with decision [phone] Pt using [place]

## 2012-09-13 NOTE — Telephone Encounter (Signed)
The bottom line is patient must try preferred medications first I cannot override until she has tried those. Clobetasol generic lotion could be called in by the nurse first. 60 cc apply twice a day when necessary 2 refills.

## 2012-09-13 NOTE — Telephone Encounter (Signed)
Notified patient must try and fail preferred first before it can be approved. Called in Clobetasol lotion 60 cc Apply BID prn with 2 refills to [place]. Patient verbalized understanding.

## 2012-09-15 ENCOUNTER — Telehealth: Payer: Self-pay | Admitting: Family Medicine

## 2012-09-15 NOTE — Telephone Encounter (Signed)
Obtained prior auth for Clobetasol Lotion, expires 09/15/13, faxed approval to [place]

## 2012-10-09 ENCOUNTER — Telehealth: Payer: Self-pay | Admitting: Family Medicine

## 2012-10-09 NOTE — Telephone Encounter (Signed)
Pt is having difficulty with omeprazole for her stomach, she feels she is having acid reflux? Burning in her stomach to throat, took some nexium/omeprazole of her moms and it seemed to help her.

## 2012-10-09 NOTE — Telephone Encounter (Signed)
Having problems with reflux- burning in stomach- sour burps .  Took one of her mom's omeprazole and it worked great but she can't afford the OTC Omeprazole.

## 2012-10-10 ENCOUNTER — Other Ambulatory Visit: Payer: Self-pay

## 2012-10-10 MED ORDER — OMEPRAZOLE 20 MG PO CPDR: 20.0000 mg | DELAYED_RELEASE_CAPSULE | Freq: Every day | ORAL | Status: DC

## 2012-10-10 NOTE — Telephone Encounter (Signed)
If patient has insurance then Protonix 40 m 1 daily #33 refills if no insurance OTC generic omeprazole or OTC generic Zantac is cheap as it gets followup if problemsg

## 2012-10-10 NOTE — Telephone Encounter (Signed)
Omeprazole 20 mg, #30, 4 refills

## 2012-10-10 NOTE — Telephone Encounter (Signed)
Sent Omeprazole 20 mg, #30, 4 refills to [facility]. Patient was notified.

## 2012-10-10 NOTE — Telephone Encounter (Signed)
Patient has Medicaid and wants omeprazole -cant affors to pay for it over the counter

## 2012-10-24 ENCOUNTER — Telehealth: Payer: Self-pay | Admitting: Family Medicine

## 2012-10-24 NOTE — Telephone Encounter (Signed)
Left message to return call 

## 2012-10-24 NOTE — Telephone Encounter (Signed)
Consult with Dr. Nicki Reaper- Patient has gotten #50 Vicodin in a week and we will not be filling the Vicodin.

## 2012-10-24 NOTE — Telephone Encounter (Signed)
Patient calling office wanting pain medicine for bad tooth. Has been getting pain meds from Doctor Judkins the dentist. She just got 25 vicodin on 8/25 and got 25 more on the 29th. And now see wants another refill today on pain meds from our office tried to explain she can only get pain meds from one doctor. She was not having that.

## 2012-10-25 NOTE — Telephone Encounter (Signed)
Discussed with patient. Patient verbalized understanding.

## 2012-11-22 ENCOUNTER — Telehealth: Payer: Self-pay | Admitting: Family Medicine

## 2012-11-22 NOTE — Telephone Encounter (Signed)
Patient would like someone to call her back regarding her tonsils being swollen and giving her a fit. She said she has to take advil in the am for her to be able to talk in the morning because they are so swollen.

## 2012-11-22 NOTE — Telephone Encounter (Signed)
Patient advised to call back and schedule office visit for evaluation

## 2012-11-27 ENCOUNTER — Telehealth: Payer: Self-pay | Admitting: Family Medicine

## 2012-11-27 NOTE — Telephone Encounter (Signed)
She may try over-the-counter medications as she wishes. Typically with fever cough and congestion we recommend evaluation. Without evaluation it would be poor medicine to call in antibiotics

## 2012-11-27 NOTE — Telephone Encounter (Signed)
Pt states that she has been on 3 rounds of antibiotics since sept 6th and had two round 30 days before her oral surgery. She feels her immune system was compromised from all the antibiotics.  She currently has intermittent fever, cough, sinus congestion and had no way to get into the office. She wants to know if you can call her in something for her symptoms?  I advised that we don't call in an antibiotics.

## 2012-11-27 NOTE — Telephone Encounter (Signed)
Notified patient she may try over-the-counter medications as she wishes. Typically with fever cough and congestion we recommend evaluation. Without evaluation it would be poor medicine to call in antibiotics. Transferred to front desk to schedule appt.

## 2012-11-28 ENCOUNTER — Ambulatory Visit (INDEPENDENT_AMBULATORY_CARE_PROVIDER_SITE_OTHER): Payer: Medicaid Other | Admitting: Family Medicine

## 2012-11-28 ENCOUNTER — Encounter: Payer: Self-pay | Admitting: Family Medicine

## 2012-11-28 VITALS — BP 122/70 | Temp 99.3°F | Ht 61.5 in | Wt 182.0 lb

## 2012-11-28 DIAGNOSIS — R635 Abnormal weight gain: Secondary | ICD-10-CM

## 2012-11-28 DIAGNOSIS — J019 Acute sinusitis, unspecified: Secondary | ICD-10-CM

## 2012-11-28 MED ORDER — PREDNISONE 20 MG PO TABS: ORAL_TABLET | ORAL | Status: AC

## 2012-11-28 MED ORDER — PHENTERMINE HCL 37.5 MG PO TABS: 37.5000 mg | ORAL_TABLET | Freq: Every day | ORAL | Status: DC

## 2012-11-28 MED ORDER — AZITHROMYCIN 250 MG PO TABS: ORAL_TABLET | ORAL | Status: DC

## 2012-11-28 NOTE — Progress Notes (Signed)
  Subjective:    Patient ID: Belinda [name], female    DOB: 12-07-1980, 32 y.o.   MRN: [id number]  Sinusitis This is a new problem. The current episode started in the past 7 days. The problem is unchanged. The maximum temperature recorded prior to her arrival was 100 - 100.9 F. Her pain is at a severity of 8/10. The pain is moderate. Associated symptoms include chills, congestion, coughing, headaches and shortness of breath. (Body aches, vomiting) Past treatments include oral decongestants. The treatment provided no relief.   Started with severe Ha 5 days ago, had n+v, then sore throat and tonsil pain. Then into the lungs causing wheezing as well. Via dentist has been on amoxil and clindamycin Ears feel stuffy, hearing down bcz of this.   Review of Systems  Constitutional: Positive for chills.  HENT: Positive for congestion.   Respiratory: Positive for cough and shortness of breath.   Neurological: Positive for headaches.   Patient has been trying to lose weight medication is help she would like to have additional prescription for the entire mean if possible    Objective:   Physical Exam Lungs clear hearts regular pulse normal extremities no edema skin warm dry moderate sinusitis       Assessment & Plan:  #1 sinusitis Zithromax, if ongoing troubles or worse followup #2 obesity Adipex with 1 refill. If ongoing troubles followup. I don't recommend ongoing Adipex

## 2012-11-30 ENCOUNTER — Ambulatory Visit: Payer: Medicaid Other | Admitting: Family Medicine

## 2012-12-06 ENCOUNTER — Telehealth: Payer: Self-pay | Admitting: Family Medicine

## 2012-12-06 NOTE — Telephone Encounter (Signed)
Patient states she is still having a terrible cough. She felt better on the last day of antibiotics now, but is feeling worse now. Patient states she is coughing until puking. She wants to know if she can have a cough syrup called in or if she needs another antibiotic  [facility]

## 2012-12-07 ENCOUNTER — Other Ambulatory Visit: Payer: Self-pay | Admitting: Family Medicine

## 2012-12-07 ENCOUNTER — Telehealth: Payer: Self-pay | Admitting: Family Medicine

## 2012-12-07 MED ORDER — SULFAMETHOXAZOLE-TMP DS 800-160 MG PO TABS: 1.0000 | ORAL_TABLET | Freq: Two times a day (BID) | ORAL | Status: DC

## 2012-12-07 NOTE — Telephone Encounter (Signed)
Script for Bactrim DS sent to [facility], notify pt, f/u if ongoing issues.

## 2012-12-07 NOTE — Telephone Encounter (Signed)
Pt notified on voicemail.

## 2012-12-07 NOTE — Telephone Encounter (Signed)
There are 2 choices #1-Robitussin-DM OTC #2 Tessalon 100 mg 3 times a day when necessary, #21..(I will not do hydrocodone cough medicine)

## 2012-12-07 NOTE — Telephone Encounter (Signed)
Patient needs Rx for cough medicine to [facility]

## 2012-12-08 MED ORDER — BENZONATATE 100 MG PO CAPS: 100.0000 mg | ORAL_CAPSULE | Freq: Three times a day (TID) | ORAL | Status: DC | PRN

## 2012-12-08 NOTE — Telephone Encounter (Signed)
Left message on voicemail to return call.

## 2012-12-08 NOTE — Telephone Encounter (Signed)
Medication sent to pharmacy. Patient was notified.  

## 2012-12-22 ENCOUNTER — Other Ambulatory Visit: Payer: Self-pay | Admitting: Family Medicine

## 2012-12-23 ENCOUNTER — Telehealth: Payer: Self-pay | Admitting: Family Medicine

## 2012-12-23 NOTE — Telephone Encounter (Signed)
Spoke to patient regarding her psoriasis. She paged nursing link and asked about clobetasol refill. Reviewed medical chart with patient and only saw elicon on her medication list. Also noted a phone encounter and stated that the Clobetasol was filled in July with 2 refills and required a PA. Explained to patient that I was unable to fill this medication over the phone and due to the fact it needed a PA previously. Her PCP would be the one to make that call if refills are indicated. Have advised her to contact her PCP on Monday.  She voiced understanding although she was extremely anxious, she understood why I was unable to get this filled. I also stated that if her pruritis was bad enough, she could go to the ER. She told me, it wasn't bad enough to go to the ER just for itching and that she would wait until Monday.  I also told her to do benadryl 15m Q6 prn for the pruritis.

## 2012-12-25 ENCOUNTER — Telehealth: Payer: Self-pay | Admitting: Family Medicine

## 2012-12-25 NOTE — Telephone Encounter (Signed)
error 

## 2013-01-01 ENCOUNTER — Telehealth: Payer: Self-pay | Admitting: Family Medicine

## 2013-01-01 ENCOUNTER — Other Ambulatory Visit: Payer: Self-pay | Admitting: *Deleted

## 2013-01-01 DIAGNOSIS — F411 Generalized anxiety disorder: Secondary | ICD-10-CM

## 2013-01-01 MED ORDER — ALPRAZOLAM 1 MG PO TABS: 1.0000 mg | ORAL_TABLET | Freq: Four times a day (QID) | ORAL | Status: DC | PRN

## 2013-01-01 NOTE — Telephone Encounter (Signed)
May refill medication with 2 refills, may get refill for weds

## 2013-01-01 NOTE — Telephone Encounter (Signed)
Patient states she is NOT out of her Medication, however she knows it takes "a minute" to get a prescription refilled.  She would like to have her ALPRAZolam Belinda [name]) 1 MG tablet - [facility] in [place]  Please call patient to discuss

## 2013-01-01 NOTE — Telephone Encounter (Signed)
Patient states she is NOT out of her Medication, however she knows it takes "a minute" to get a prescription refilled.   She would like to have her ALPRAZolam Duanne Moron) 1 MG tablet - [facility] in [place]   Please call patient to discuss

## 2013-01-01 NOTE — Telephone Encounter (Signed)
Left message and faxed in medication

## 2013-01-02 ENCOUNTER — Other Ambulatory Visit: Payer: Self-pay | Admitting: Family Medicine

## 2013-01-02 NOTE — Telephone Encounter (Signed)
Last med check 09/09/12

## 2013-01-03 ENCOUNTER — Other Ambulatory Visit: Payer: Self-pay | Admitting: Family Medicine

## 2013-01-03 NOTE — Telephone Encounter (Signed)
Refilled

## 2013-01-03 NOTE — Telephone Encounter (Signed)
Refill times ONE needs OV for Xanax

## 2013-01-16 ENCOUNTER — Ambulatory Visit: Payer: Medicaid Other | Admitting: Family Medicine

## 2013-01-30 ENCOUNTER — Encounter: Payer: Self-pay | Admitting: Family Medicine

## 2013-01-30 ENCOUNTER — Telehealth: Payer: Self-pay | Admitting: Family Medicine

## 2013-01-30 ENCOUNTER — Ambulatory Visit (INDEPENDENT_AMBULATORY_CARE_PROVIDER_SITE_OTHER): Payer: Medicaid Other | Admitting: Family Medicine

## 2013-01-30 VITALS — BP 104/70 | Ht 61.0 in | Wt 177.0 lb

## 2013-01-30 DIAGNOSIS — L409 Psoriasis, unspecified: Secondary | ICD-10-CM

## 2013-01-30 DIAGNOSIS — F411 Generalized anxiety disorder: Secondary | ICD-10-CM

## 2013-01-30 DIAGNOSIS — L408 Other psoriasis: Secondary | ICD-10-CM

## 2013-01-30 MED ORDER — ALPRAZOLAM 1 MG PO TABS: ORAL_TABLET | ORAL | Status: DC

## 2013-01-30 MED ORDER — ALUMINUM CHLORIDE 20 % EX SOLN: Freq: Every day | CUTANEOUS | Status: DC

## 2013-01-30 MED ORDER — CLOBETASOL PROPIONATE 0.05 % EX SOLN: CUTANEOUS | Status: DC

## 2013-01-30 NOTE — Telephone Encounter (Signed)
Pt wants to know if she can exchange her script for the xanax? She needs it filled for the 10th on the 11th due to her having to take an extra one for her panic attacks. She will run out tomorrow if she has to wait till the 11th

## 2013-01-30 NOTE — Telephone Encounter (Signed)
According to pharmacy her last rx was filled and picked up 01/03/13 and they will not fill till 02/01/13. Consult with Dr Nicki Reaper we cant fill early. Sorry.

## 2013-01-30 NOTE — Progress Notes (Signed)
   Subjective:    Patient ID: Belinda [name], female    DOB: [birth year], 32 y.o.   MRN: [id number]  HPIHere for a follow up on meds. Wants to discuss depression and getting set up with a counselor. Needs refill on xanax and clobetasol cream.  pmh anziety   Review of Systems     Objective:   Physical Exam Long discussion with pt for  Anxiety and psoriasis Not depressed 20 mintues spent with pt       Assessment & Plan:  Refills given F/u 4 months rec counseling

## 2013-01-30 NOTE — Telephone Encounter (Signed)
Discussed with patient -Advised patient that xanax is a controlled substance and we can not refill early. Patient verbalized understanding.

## 2013-01-31 ENCOUNTER — Other Ambulatory Visit: Payer: Self-pay | Admitting: Family Medicine

## 2013-02-28 ENCOUNTER — Emergency Department (HOSPITAL_COMMUNITY)
Admission: EM | Admit: 2013-02-28 | Discharge: 2013-02-28 | Discharge status: Against Medical Advice | Payer: Medicaid Other | Attending: Emergency Medicine | Admitting: Emergency Medicine

## 2013-02-28 ENCOUNTER — Encounter (HOSPITAL_COMMUNITY): Payer: Self-pay | Admitting: Emergency Medicine

## 2013-02-28 DIAGNOSIS — J45909 Unspecified asthma, uncomplicated: Secondary | ICD-10-CM | POA: Insufficient documentation

## 2013-02-28 DIAGNOSIS — F17200 Nicotine dependence, unspecified, uncomplicated: Secondary | ICD-10-CM | POA: Insufficient documentation

## 2013-02-28 DIAGNOSIS — F410 Panic disorder [episodic paroxysmal anxiety] without agoraphobia: Secondary | ICD-10-CM | POA: Insufficient documentation

## 2013-02-28 HISTORY — DX: Anxiety disorder, unspecified: F41.9

## 2013-02-28 HISTORY — DX: Unspecified asthma, uncomplicated: J45.909

## 2013-02-28 NOTE — ED Notes (Signed)
Pt reporting anxiety and chest pain for 2 weeks. Reports that she is out of Xanax.

## 2013-02-28 NOTE — ED Notes (Signed)
Following triage, pt has decided that she didn't want to wait to be seen and was going to try to go home and sleep.

## 2013-03-13 ENCOUNTER — Encounter: Payer: Self-pay | Admitting: Family Medicine

## 2013-03-13 ENCOUNTER — Ambulatory Visit (INDEPENDENT_AMBULATORY_CARE_PROVIDER_SITE_OTHER): Payer: Medicaid Other | Admitting: Family Medicine

## 2013-03-13 VITALS — BP 122/86 | Ht 61.0 in | Wt 182.1 lb

## 2013-03-13 DIAGNOSIS — F411 Generalized anxiety disorder: Secondary | ICD-10-CM

## 2013-03-13 DIAGNOSIS — R635 Abnormal weight gain: Secondary | ICD-10-CM

## 2013-03-13 DIAGNOSIS — J45909 Unspecified asthma, uncomplicated: Secondary | ICD-10-CM

## 2013-03-13 DIAGNOSIS — Z0189 Encounter for other specified special examinations: Secondary | ICD-10-CM

## 2013-03-13 DIAGNOSIS — Z79899 Other long term (current) drug therapy: Secondary | ICD-10-CM

## 2013-03-13 DIAGNOSIS — J312 Chronic pharyngitis: Secondary | ICD-10-CM

## 2013-03-13 MED ORDER — ALBUTEROL SULFATE HFA 108 (90 BASE) MCG/ACT IN AERS: 2.0000 | INHALATION_SPRAY | Freq: Four times a day (QID) | RESPIRATORY_TRACT | Status: DC | PRN

## 2013-03-13 MED ORDER — PHENTERMINE HCL 37.5 MG PO TABS: 37.5000 mg | ORAL_TABLET | Freq: Every day | ORAL | Status: DC

## 2013-03-13 MED ORDER — ALBUTEROL SULFATE (2.5 MG/3ML) 0.083% IN NEBU: 2.5000 mg | INHALATION_SOLUTION | Freq: Four times a day (QID) | RESPIRATORY_TRACT | Status: DC | PRN

## 2013-03-13 MED ORDER — BUDESONIDE 0.5 MG/2ML IN SUSP: 0.5000 mg | Freq: Two times a day (BID) | RESPIRATORY_TRACT | Status: DC

## 2013-03-13 NOTE — Progress Notes (Signed)
   Subjective:    Patient ID: Belinda [name], female    DOB: Oct 29, 1980, 33 y.o.   MRN: [id number]  Anxiety Presents for follow-up visit. Onset was more than 5 years ago. The problem has been gradually improving. Patient reports no chest pain, confusion or shortness of breath. Symptoms occur constantly. Nothing aggravates the symptoms.   There are no known risk factors. Past treatments include benzodiazephines. The treatment provided significant relief. Compliance with prior treatments has been good. Compliance with medications is 76-100%.  Patient states that she needs a refill on her phentermine, albuterol and proventil. She states that she wants to check on the status of her being setup with a counselor. She also wants to discuss being referred to have her tonsils removed.     Review of Systems  Constitutional: Negative for activity change, appetite change and fatigue.  Respiratory: Positive for cough and wheezing. Negative for shortness of breath.   Cardiovascular: Negative for chest pain.  Gastrointestinal: Negative for abdominal pain.  Endocrine: Negative for polydipsia and polyphagia.  Genitourinary: Negative for frequency.  Neurological: Negative for weakness.  Psychiatric/Behavioral: Negative for confusion.       Objective:   Physical Exam  Vitals reviewed. Constitutional: She appears well-nourished. No distress.  Cardiovascular: Normal rate, regular rhythm and normal heart sounds.   No murmur heard. Pulmonary/Chest: Effort normal and breath sounds normal. No respiratory distress.  Musculoskeletal: She exhibits no edema.  Lymphadenopathy:    She has no cervical adenopathy.  Neurological: She is alert. She exhibits normal muscle tone.  Psychiatric: Her behavior is normal.          Assessment & Plan:  Follow up by May Referrals-we are going to be referring her to ENT because of tonsillar hypertrophy. She relates she snores at night she has difficult time swallowing  at times feels like she can't breathe. I told her that ENT will evaluate whether or not she needs surgery. Labs-patient does need screening blood work for cholesterol and sugar. Tonsillar hypertrophy. Please see discussion above. anxiety-generalized anxiety disorder she has a lot of problems with anxiety counseling would help I offered he and if present to go with Xanax she states every single one she's never tried made her worse she does not feel depressed does not even want to think about taking one History of asthma currently she uses Pulmicort on a when necessary basis I told her that's not how it is used his best to be used daily to prevent attacks also told her she may need to be changed Qvar in the future if Pulmicort not covered she uses albuterol on a when necessary basis occasional use not frequent  Weight loss-prescriptions regarding phentermine were given. She can use this for 2 additional months and that's it. 25 minutes spent with patient.

## 2013-03-14 ENCOUNTER — Telehealth: Payer: Self-pay | Admitting: Family Medicine

## 2013-03-14 NOTE — Telephone Encounter (Signed)
°  phentermine (ADIPEX-P) 37.5 MG tablet   Pt states that when she got to the pharmacy they told her this script was expired Can we call in this one for her and then she will use the hand written script after  This one is done  [facility]

## 2013-03-14 NOTE — Telephone Encounter (Signed)
Patient advised to fill the rx she got yesterday. Patient verbalized understanding.

## 2013-03-20 ENCOUNTER — Other Ambulatory Visit: Payer: Self-pay

## 2013-03-20 MED ORDER — BUDESONIDE 0.5 MG/2ML IN SUSP: 0.5000 mg | Freq: Two times a day (BID) | RESPIRATORY_TRACT | Status: DC

## 2013-04-02 ENCOUNTER — Telehealth (HOSPITAL_COMMUNITY): Payer: Self-pay | Admitting: *Deleted

## 2013-04-20 ENCOUNTER — Telehealth: Payer: Self-pay | Admitting: Family Medicine

## 2013-04-20 NOTE — Telephone Encounter (Signed)
So noted 

## 2013-04-20 NOTE — Telephone Encounter (Signed)
FYI - Received email from [facility] @ Dr. Deeann Saint office - pt does not want to schedule appointment to be seen

## 2013-06-01 ENCOUNTER — Other Ambulatory Visit: Payer: Self-pay | Admitting: Family Medicine

## 2013-06-18 ENCOUNTER — Other Ambulatory Visit: Payer: Self-pay | Admitting: Family Medicine

## 2013-06-18 NOTE — Telephone Encounter (Signed)
1 refill only

## 2013-06-18 NOTE — Telephone Encounter (Signed)
Last seen 1/20

## 2013-06-21 ENCOUNTER — Telehealth: Payer: Self-pay | Admitting: Family Medicine

## 2013-06-21 NOTE — Telephone Encounter (Signed)
Patient says she has a painful rash and its in between her legs. She said she has multiple spots on her legs. She said it itches severely and burns. She said they look round and looks like shingles. She has an appointment to come in tomorrow, but she wants to know what she can do in the mean time to help her. She said if we can call something in tonight, then she will tell us a pharmacy when she talks to the nurse.

## 2013-06-22 ENCOUNTER — Ambulatory Visit (INDEPENDENT_AMBULATORY_CARE_PROVIDER_SITE_OTHER): Payer: Medicaid Other | Admitting: Nurse Practitioner

## 2013-06-22 VITALS — BP 124/78 | Temp 98.5°F | Ht 60.0 in | Wt 168.0 lb

## 2013-06-22 DIAGNOSIS — Z113 Encounter for screening for infections with a predominantly sexual mode of transmission: Secondary | ICD-10-CM

## 2013-06-22 DIAGNOSIS — B354 Tinea corporis: Secondary | ICD-10-CM

## 2013-06-22 MED ORDER — TRIAMCINOLONE ACETONIDE 0.1 % EX CREA: 1.0000 "application " | TOPICAL_CREAM | Freq: Two times a day (BID) | CUTANEOUS | Status: DC

## 2013-06-22 MED ORDER — KETOCONAZOLE 2 % EX CREA: 1.0000 "application " | TOPICAL_CREAM | Freq: Two times a day (BID) | CUTANEOUS | Status: DC

## 2013-06-22 MED ORDER — NAPROXEN 500 MG PO TABS: 500.0000 mg | ORAL_TABLET | Freq: Two times a day (BID) | ORAL | Status: DC

## 2013-06-22 MED ORDER — PHENTERMINE HCL 37.5 MG PO TABS: ORAL_TABLET | ORAL | Status: DC

## 2013-06-22 NOTE — Telephone Encounter (Signed)
Need to see rash at office visit tomorrow. Can go to urgent care tonite if needed. Patient was advised by the nurse.

## 2013-06-25 ENCOUNTER — Telehealth: Payer: Self-pay | Admitting: Family Medicine

## 2013-06-25 ENCOUNTER — Encounter: Payer: Self-pay | Admitting: Nurse Practitioner

## 2013-06-25 MED ORDER — ALPRAZOLAM 1 MG PO TABS: ORAL_TABLET | ORAL | Status: DC

## 2013-06-25 NOTE — Telephone Encounter (Signed)
ALPRAZolam (XANAX) 1 MG tablet  Pt needs refill on this till she can get in here to be seen on the 11th of this month  Was seen 5/1 by Hoyle Sauer She did not refill this   [facility]   She will be out of her meds on the 7th

## 2013-06-25 NOTE — Telephone Encounter (Addendum)
Seen Friday for rash got med for rash and Adipex refills -did not get xanax refill on Friday-was told to schedule follow up office visit for xanax refill

## 2013-06-25 NOTE — Telephone Encounter (Signed)
She may have a two-week prescription otherwise she needs to set up a followup visit with me

## 2013-06-25 NOTE — Telephone Encounter (Signed)
Script faxed to pharmacy. Patient was notified. Patient stated that she has appointment scheduled.

## 2013-06-25 NOTE — Progress Notes (Signed)
Subjective:  Presents complaints of itchy painful rash in the upper thigh area/lower abdominal area for the past 3 weeks. Slight itching and burning. More so on the left side. Has a new sexual partner. No pelvic pain fever or discharge. No chest pain or shortness of breath. Would like to get a refill on her phentermine. Staying active. Drinking water. Healthy diet. Patient also requesting a refill on her Xanax, explained that she will have to come back for a regular followup visit to get a refill per our protocol.  Objective:   BP 124/78  Temp(Src) 98.5 F (36.9 C)  Ht 5' (1.524 m)  Wt 168 lb (76.204 kg)  BMI 32.81 kg/m2 NAD. Alert, oriented. Very anxious affect. Thoughts logical coherent and relevant although jumping from topic to topic, hard to get her to focus. Lungs clear. Heart regular rhythm. Shunning confluent well-defined erythematous rash noted in the skin folds under the abdomen and upper thigh area. Mild central clearing.  Assessment: Problem List Items Addressed This Visit     Other   Morbid obesity   Relevant Medications      phentermine (ADIPEX-P) 37.5 MG tablet    Other Visit Diagnoses   Tinea corporis    -  Primary    Relevant Medications       ketoconazole (NIZORAL) 2 % cream    Screening examination for venereal disease        Relevant Orders       HIV antibody       RPR       GC/chlamydia probe amp, urine       Hepatitis C antibody      Plan: Meds ordered this encounter  Medications  . phentermine (ADIPEX-P) 37.5 MG tablet    Sig: TAKE 1 TABLET BY MOUTH EVERY DAY BEFORE BREAKFAST    Dispense:  30 tablet    Refill:  2    Order Specific Question:  Supervising Provider    Answer:  Mikey Kirschner [[id number]]  . ketoconazole (NIZORAL) 2 % cream    Sig: Apply 1 application topically 2 (two) times daily.    Dispense:  30 g    Refill:  0    Order Specific Question:  Supervising Provider    Answer:  Mikey Kirschner [[id number]]  . triamcinolone cream (KENALOG) 0.1 %     Sig: Apply 1 application topically 2 (two) times daily. Prn rash; use up to 2 weeks    Dispense:  30 g    Refill:  0    Order Specific Question:  Supervising Provider    Answer:  Mikey Kirschner [[id number]]  . naproxen (NAPROSYN) 500 MG tablet    Sig: Take 1 tablet (500 mg total) by mouth 2 (two) times daily with a meal.    Dispense:  30 tablet    Refill:  0    Order Specific Question:  Supervising Provider    Answer:  Mikey Kirschner [[id number]]   Given naproxen for pain. Apply triamcinolone and ketoconazole cream  together twice a day. Strongly encouraged patient to schedule appointment for recheck on her anxiety and refills on her Xanax. Discussed safe sex issues.

## 2013-07-02 ENCOUNTER — Encounter: Payer: Self-pay | Admitting: Family Medicine

## 2013-07-02 ENCOUNTER — Ambulatory Visit (INDEPENDENT_AMBULATORY_CARE_PROVIDER_SITE_OTHER): Payer: Medicaid Other | Admitting: Family Medicine

## 2013-07-02 VITALS — BP 110/80 | Ht 61.0 in | Wt 165.2 lb

## 2013-07-02 DIAGNOSIS — Z0189 Encounter for other specified special examinations: Secondary | ICD-10-CM

## 2013-07-02 DIAGNOSIS — F411 Generalized anxiety disorder: Secondary | ICD-10-CM

## 2013-07-02 DIAGNOSIS — Z79899 Other long term (current) drug therapy: Secondary | ICD-10-CM

## 2013-07-02 MED ORDER — ALPRAZOLAM 1 MG PO TABS: ORAL_TABLET | ORAL | Status: DC

## 2013-07-02 MED ORDER — NORGESTIM-ETH ESTRAD TRIPHASIC 0.18/0.215/0.25 MG-35 MCG PO TABS: 1.0000 | ORAL_TABLET | Freq: Every day | ORAL | Status: DC

## 2013-07-02 NOTE — Progress Notes (Signed)
   Subjective:    Patient ID: Belinda [name], female    DOB: 1981/01/31, 33 y.o.   MRN: [id number]  Anxiety Presents for follow-up visit. Patient reports no chest pain, confusion or dizziness. Symptoms occur occasionally. The severity of symptoms is interfering with daily activities. The quality of sleep is poor. Nighttime awakenings: several.   Compliance with medications is 76-100%.   Patient wants to know if she can get refills on her birth control pills through our office now.    Review of Systems  Constitutional: Negative for fever and fatigue.  HENT: Negative for congestion.   Respiratory: Negative for cough.   Cardiovascular: Negative for chest pain.  Neurological: Negative for dizziness.  Psychiatric/Behavioral: Positive for sleep disturbance. Negative for behavioral problems and confusion.       Objective:   Physical Exam  Vitals reviewed. Constitutional: She appears well-nourished. No distress.  Cardiovascular: Normal rate, regular rhythm and normal heart sounds.   No murmur heard. Pulmonary/Chest: Effort normal and breath sounds normal. No respiratory distress.  Musculoskeletal: She exhibits no edema.  Lymphadenopathy:    She has no cervical adenopathy.  Neurological: She is alert. She exhibits normal muscle tone.  Psychiatric: Her behavior is normal.          Assessment & Plan:  1. Generalized anxiety disorder We will go ahead and get her set up for a psychiatry appointment patient states that Wellbutrin is done better in the past she is fearful of Zoloft because she had suicidal ideation on because of long-term use of medications we will check kidney and liver functions - Ambulatory referral to Psychiatry  2. Encounter for long-term (current) use of other medications See above - Hepatic function panel - Basic metabolic panel  3. Other specified examination Cholesterol screening because of family history - Lipid panel  Followup in approximately 4-5  months

## 2013-07-04 LAB — BASIC METABOLIC PANEL
BUN: 8 mg/dL (ref 6–23)
CO2: 28 mEq/L (ref 19–32)
Calcium: 9 mg/dL (ref 8.4–10.5)
Chloride: 100 mEq/L (ref 96–112)
Creat: 0.76 mg/dL (ref 0.50–1.10)
Glucose, Bld: 84 mg/dL (ref 70–99)
Potassium: 3.9 mEq/L (ref 3.5–5.3)
Sodium: 136 mEq/L (ref 135–145)

## 2013-07-04 LAB — LIPID PANEL
Cholesterol: 148 mg/dL (ref 0–200)
HDL: 47 mg/dL (ref >39)
LDL Cholesterol: 80 mg/dL (ref 0–99)
Total CHOL/HDL Ratio: 3.1 Ratio
Triglycerides: 104 mg/dL (ref <150)
VLDL: 21 mg/dL (ref 0–40)

## 2013-07-04 LAB — HEPATIC FUNCTION PANEL
ALT: 20 U/L (ref 0–35)
AST: 22 U/L (ref 0–37)
Albumin: 4.3 g/dL (ref 3.5–5.2)
Alkaline Phosphatase: 50 U/L (ref 39–117)
Bilirubin, Direct: 0.1 mg/dL (ref 0.0–0.3)
Indirect Bilirubin: 0.5 mg/dL (ref 0.2–1.2)
Total Bilirubin: 0.6 mg/dL (ref 0.2–1.2)
Total Protein: 7 g/dL (ref 6.0–8.3)

## 2013-07-05 ENCOUNTER — Encounter: Payer: Self-pay | Admitting: Family Medicine

## 2013-07-05 LAB — RPR

## 2013-07-05 LAB — HEPATITIS C ANTIBODY: HCV Ab: NEGATIVE

## 2013-07-05 LAB — HIV ANTIBODY (ROUTINE TESTING W REFLEX): HIV 1&2 Ab, 4th Generation: NONREACTIVE

## 2013-07-05 LAB — GC/CHLAMYDIA PROBE AMP, URINE
Chlamydia, Swab/Urine, PCR: NEGATIVE
GC Probe Amp, Urine: NEGATIVE

## 2013-07-06 ENCOUNTER — Telehealth (HOSPITAL_COMMUNITY): Payer: Self-pay | Admitting: *Deleted

## 2013-07-06 ENCOUNTER — Encounter: Payer: Self-pay | Admitting: Family Medicine

## 2013-07-11 ENCOUNTER — Telehealth: Payer: Self-pay | Admitting: Family Medicine

## 2013-07-11 NOTE — Telephone Encounter (Signed)
Appointment moved up  Appt is now 07/30/13 @ 1:00 with Dr. Harrington Challenger @ [place] office [phone]) Called, gave appt info to pt's grandma, explained how important it is that she not miss this appt

## 2013-07-30 ENCOUNTER — Ambulatory Visit (HOSPITAL_COMMUNITY): Payer: Self-pay | Admitting: Psychiatry

## 2013-07-31 ENCOUNTER — Telehealth: Payer: Self-pay | Admitting: Family Medicine

## 2013-07-31 NOTE — Telephone Encounter (Signed)
One thing she could do is to use MiraLax one capful once or twice daily. If she has ongoing troubles the next step would be there is a prescription medication for constipation with curable bowel that can help but it tends to be pretty expensive, patient to let us know. It would also be wise for this patient to be evaluated regarding all of this somewhere within the next 3 weeks

## 2013-07-31 NOTE — Telephone Encounter (Signed)
Pt said that MiraLax makes her even more constipated? She is going to try FiberOne bars. She said that she would call us back if symptoms persist.

## 2013-07-31 NOTE — Telephone Encounter (Signed)
Patient said that she has not been going to the bathroom regularly for almost 3 months. She said she has half of a bowel movement about 1 times a month. She has tried laxatives and prunes, milk of magnesia. She said the constipation is giving her stomach cramps, headaches and eating issues.   [facility]

## 2013-08-22 ENCOUNTER — Ambulatory Visit (HOSPITAL_COMMUNITY): Payer: Self-pay | Admitting: Psychiatry

## 2013-09-07 ENCOUNTER — Other Ambulatory Visit: Payer: Self-pay | Admitting: Nurse Practitioner

## 2013-09-07 ENCOUNTER — Other Ambulatory Visit: Payer: Self-pay | Admitting: Family Medicine

## 2013-09-07 NOTE — Telephone Encounter (Signed)
Last seen 07/02/13

## 2013-09-10 NOTE — Telephone Encounter (Signed)
Last seen 07/02/13

## 2013-10-09 ENCOUNTER — Telehealth: Payer: Self-pay | Admitting: Family Medicine

## 2013-10-09 MED ORDER — CLOBETASOL PROPIONATE 0.05 % EX SOLN: CUTANEOUS | Status: DC

## 2013-10-09 NOTE — Telephone Encounter (Signed)
Rx sent electronically to pharmacy. Patient notified. 

## 2013-10-09 NOTE — Telephone Encounter (Signed)
Refilled on 09/07/13.  Last seen 07/02/13

## 2013-10-09 NOTE — Telephone Encounter (Signed)
clobetasol (TEMOVATE) 0.05 % external solution  Please refill an [facility]

## 2013-10-09 NOTE — Telephone Encounter (Signed)
Ok times 3

## 2013-10-10 ENCOUNTER — Emergency Department (HOSPITAL_COMMUNITY)
Admission: EM | Admit: 2013-10-10 | Discharge: 2013-10-11 | Disposition: A | Discharge status: Home / Self Care | Payer: Medicaid Other | Attending: Emergency Medicine | Admitting: Emergency Medicine

## 2013-10-10 ENCOUNTER — Encounter (HOSPITAL_COMMUNITY): Payer: Self-pay | Admitting: Emergency Medicine

## 2013-10-10 DIAGNOSIS — Z79899 Other long term (current) drug therapy: Secondary | ICD-10-CM | POA: Insufficient documentation

## 2013-10-10 DIAGNOSIS — F17200 Nicotine dependence, unspecified, uncomplicated: Secondary | ICD-10-CM | POA: Insufficient documentation

## 2013-10-10 DIAGNOSIS — Z791 Long term (current) use of non-steroidal anti-inflammatories (NSAID): Secondary | ICD-10-CM | POA: Diagnosis not present

## 2013-10-10 DIAGNOSIS — F319 Bipolar disorder, unspecified: Secondary | ICD-10-CM | POA: Insufficient documentation

## 2013-10-10 DIAGNOSIS — J45909 Unspecified asthma, uncomplicated: Secondary | ICD-10-CM | POA: Insufficient documentation

## 2013-10-10 DIAGNOSIS — Z3202 Encounter for pregnancy test, result negative: Secondary | ICD-10-CM | POA: Insufficient documentation

## 2013-10-10 DIAGNOSIS — R109 Unspecified abdominal pain: Secondary | ICD-10-CM | POA: Diagnosis present

## 2013-10-10 DIAGNOSIS — Z8742 Personal history of other diseases of the female genital tract: Secondary | ICD-10-CM | POA: Diagnosis not present

## 2013-10-10 DIAGNOSIS — Z8759 Personal history of other complications of pregnancy, childbirth and the puerperium: Secondary | ICD-10-CM

## 2013-10-10 DIAGNOSIS — Z872 Personal history of diseases of the skin and subcutaneous tissue: Secondary | ICD-10-CM | POA: Insufficient documentation

## 2013-10-10 DIAGNOSIS — K5900 Constipation, unspecified: Secondary | ICD-10-CM | POA: Insufficient documentation

## 2013-10-10 DIAGNOSIS — F411 Generalized anxiety disorder: Secondary | ICD-10-CM | POA: Diagnosis not present

## 2013-10-10 DIAGNOSIS — F1310 Sedative, hypnotic or anxiolytic abuse, uncomplicated: Secondary | ICD-10-CM | POA: Diagnosis not present

## 2013-10-10 HISTORY — DX: Bipolar disorder, unspecified: F31.9

## 2013-10-10 LAB — URINALYSIS, ROUTINE W REFLEX MICROSCOPIC
Bilirubin Urine: NEGATIVE
Glucose, UA: NEGATIVE mg/dL
Hgb urine dipstick: NEGATIVE
Ketones, ur: NEGATIVE mg/dL
Leukocytes, UA: NEGATIVE
Nitrite: NEGATIVE
Protein, ur: NEGATIVE mg/dL
Specific Gravity, Urine: 1.025 (ref 1.005–1.030)
Urobilinogen, UA: 0.2 mg/dL (ref 0.0–1.0)
pH: 5.5 (ref 5.0–8.0)

## 2013-10-10 LAB — PREGNANCY, URINE: Preg Test, Ur: NEGATIVE

## 2013-10-10 NOTE — ED Notes (Signed)
Belinda [name] at bedside.

## 2013-10-10 NOTE — ED Notes (Signed)
Pt has multiple complaints. Abdominal pain, flank pain, may be pregnant, swelling of abdomen, previous miscarriage. Pt has slurred speech and difficulty focusing and has to be redirected several times with answering questions.

## 2013-10-11 ENCOUNTER — Emergency Department (HOSPITAL_COMMUNITY): Payer: Medicaid Other

## 2013-10-11 ENCOUNTER — Other Ambulatory Visit: Payer: Self-pay | Admitting: Family Medicine

## 2013-10-11 LAB — CBC WITH DIFFERENTIAL/PLATELET
Basophils Absolute: 0 10*3/uL (ref 0.0–0.1)
Basophils Relative: 0 % (ref 0–1)
Eosinophils Absolute: 0.2 10*3/uL (ref 0.0–0.7)
Eosinophils Relative: 3 % (ref 0–5)
HCT: 36.1 % (ref 36.0–46.0)
Hemoglobin: 12.6 g/dL (ref 12.0–15.0)
Lymphocytes Relative: 55 % — ABNORMAL HIGH (ref 12–46)
Lymphs Abs: 4.1 10*3/uL — ABNORMAL HIGH (ref 0.7–4.0)
MCH: 30.7 pg (ref 26.0–34.0)
MCHC: 34.9 g/dL (ref 30.0–36.0)
MCV: 88 fL (ref 78.0–100.0)
Monocytes Absolute: 0.6 10*3/uL (ref 0.1–1.0)
Monocytes Relative: 8 % (ref 3–12)
Neutro Abs: 2.5 10*3/uL (ref 1.7–7.7)
Neutrophils Relative %: 34 % — ABNORMAL LOW (ref 43–77)
Platelets: 215 10*3/uL (ref 150–400)
RBC: 4.1 MIL/uL (ref 3.87–5.11)
RDW: 12.1 % (ref 11.5–15.5)
WBC: 7.4 10*3/uL (ref 4.0–10.5)

## 2013-10-11 LAB — RAPID URINE DRUG SCREEN, HOSP PERFORMED
Amphetamines: NOT DETECTED
Barbiturates: NOT DETECTED
Benzodiazepines: POSITIVE — AB
Cocaine: NOT DETECTED
Opiates: NOT DETECTED
Tetrahydrocannabinol: NOT DETECTED

## 2013-10-11 LAB — COMPREHENSIVE METABOLIC PANEL
ALT: 20 U/L (ref 0–35)
AST: 24 U/L (ref 0–37)
Albumin: 3.3 g/dL — ABNORMAL LOW (ref 3.5–5.2)
Alkaline Phosphatase: 50 U/L (ref 39–117)
Anion gap: 12 (ref 5–15)
BUN: 15 mg/dL (ref 6–23)
CO2: 26 mEq/L (ref 19–32)
Calcium: 8.5 mg/dL (ref 8.4–10.5)
Chloride: 101 mEq/L (ref 96–112)
Creatinine, Ser: 0.71 mg/dL (ref 0.50–1.10)
GFR calc Af Amer: >90 mL/min (ref >90)
GFR calc non Af Amer: >90 mL/min (ref >90)
Glucose, Bld: 99 mg/dL (ref 70–99)
Potassium: 4 mEq/L (ref 3.7–5.3)
Sodium: 139 mEq/L (ref 137–147)
Total Bilirubin: 0.2 mg/dL — ABNORMAL LOW (ref 0.3–1.2)
Total Protein: 6.8 g/dL (ref 6.0–8.3)

## 2013-10-11 LAB — HCG, SERUM, QUALITATIVE: Preg, Serum: NEGATIVE

## 2013-10-11 MED ORDER — MAGNESIUM CITRATE PO SOLN
1.0000 | Freq: Once | ORAL | Status: AC
  Administered 2013-10-11: 1 via ORAL
  Filled 2013-10-11: qty 296

## 2013-10-11 NOTE — ED Provider Notes (Signed)
Medical screening examination/treatment/procedure(s) were performed by non-physician practitioner and as supervising physician I was immediately available for consultation/collaboration.   EKG Interpretation None        Ezequiel Essex, MD 10/11/13 [phone]

## 2013-10-11 NOTE — ED Provider Notes (Signed)
CSN: [id number]     Arrival date & time 10/10/13  2117 History   First MD Initiated Contact with Patient 10/10/13 2304     Chief Complaint  Patient presents with  . Abdominal Pain     (Consider location/radiation/quality/duration/timing/severity/associated sxs/prior Treatment) HPI  Belinda [name] is a 33 y.o. female presenting with abdominal pain and distention present for the past 3 days.  She describes having a spontaneous abortion early May, 3 months ago while in her 1st trimester.  She has started to have bilateral nipple galactorrhea over the past several weeks and now 3 days ago she has developed abdominal distention "like I'm 5 months pregnant".  She took 4 home pregnancy tests with 2 positive and 2 negative results.  She also describes severe constipation, but reports this is a chronic condition, and has to manually disimpact herself routinely.  She has had no bm in 3 days and describes rectal pressure.  She denies nausea, vomiting, fevers or chills. She endorses generalized fatigue.    Past Medical History  Diagnosis Date  . Reactive airways dysfunction syndrome   . Psoriasis   . Narcotic addiction   . Asthma   . Anxiety   . Anxiety   . Bipolar affective    Past Surgical History  Procedure Laterality Date  . Cesarean section    . Dilation and curettage of uterus    . Pid     Family History  Problem Relation Age of Onset  . Hypertension Father   . Heart disease Maternal Grandmother    History  Substance Use Topics  . Smoking status: Current Every Day Smoker  . Smokeless tobacco: Not on file     Comment: light smoker  . Alcohol Use: No   OB History   Grav Para Term Preterm Abortions TAB SAB Ect Mult Living                 Review of Systems  Constitutional: Positive for fatigue. Negative for fever and activity change.  HENT: Negative for congestion and sore throat.   Eyes: Negative.   Respiratory: Negative for chest tightness and shortness of breath.    Cardiovascular: Negative for chest pain.  Gastrointestinal: Positive for constipation and abdominal distention. Negative for nausea, vomiting and abdominal pain.  Genitourinary: Negative.  Negative for dysuria.  Musculoskeletal: Negative for arthralgias, joint swelling and neck pain.  Skin: Negative.  Negative for rash and wound.  Neurological: Negative for dizziness, weakness, light-headedness, numbness and headaches.  Psychiatric/Behavioral: Negative.       Allergies  Augmentin and Doxycycline  Home Medications   Prior to Admission medications   Medication Sig Start Date End Date Taking? Authorizing Provider  albuterol (PROVENTIL HFA;VENTOLIN HFA) 108 (90 BASE) MCG/ACT inhaler Inhale 2 puffs into the lungs every 6 (six) hours as needed for wheezing. 03/13/13   Kathyrn Drown, MD  albuterol (PROVENTIL) (2.5 MG/3ML) 0.083% nebulizer solution Take 2.5 mg by nebulization every 6 (six) hours as needed for wheezing or shortness of breath.    Historical Provider, MD  ALPRAZolam Duanne Moron) 1 MG tablet TAKE ONE TABLET BY MOUTH 4 TIMES DAILY AS NEEDED 07/02/13   Kathyrn Drown, MD  aluminum chloride (DRYSOL) 20 % external solution Apply topically at bedtime. 01/30/13   Kathyrn Drown, MD  budesonide (PULMICORT) 0.5 MG/2ML nebulizer solution Take 2 mLs (0.5 mg total) by nebulization 2 (two) times daily. 03/13/13   Kathyrn Drown, MD  clobetasol (TEMOVATE) 0.05 % external solution APPLY  TO THE AFFECTED AREA TWICE DAILY AS NEEDED 10/09/13   Kathyrn Drown, MD  ketoconazole (NIZORAL) 2 % cream APPLY ONE APPLICATION TOPICALLY TWO TIMES DAILY    Kathyrn Drown, MD  naproxen (NAPROSYN) 500 MG tablet Take 1 tablet (500 mg total) by mouth 2 (two) times daily with a meal. 06/22/13   Nilda Simmer, NP  Norgestimate-Ethinyl Estradiol Triphasic 0.18/0.215/0.25 MG-35 MCG tablet Take 1 tablet by mouth daily. 07/02/13   Kathyrn Drown, MD  omeprazole (PRILOSEC) 20 MG capsule Take 1 capsule (20 mg total) by mouth  daily. 10/10/12 10/10/13  Kathyrn Drown, MD  phentermine (ADIPEX-P) 37.5 MG tablet TAKE 1 TABLET BY MOUTH EVERY DAY BEFORE BREAKFAST 06/22/13   Nilda Simmer, NP  triamcinolone cream (KENALOG) 0.1 % APPLY ONE APPLICATION TOPICALLY TWO TIMES DAILY AS NEEDED FOR RASH USE FOR UP TO 2 WEEKS    Scott A Luking, MD   BP 87/50  Pulse 78  Temp(Src) 98 F (36.7 C) (Oral)  Resp 20  Ht 5' (1.524 m)  Wt 145 lb (65.772 kg)  BMI 28.32 kg/m2  SpO2 97% Physical Exam  Nursing note and vitals reviewed. Constitutional: She appears well-developed and well-nourished.  HENT:  Head: Normocephalic and atraumatic.  Eyes: Conjunctivae are normal.  Neck: Normal range of motion.  Cardiovascular: Normal rate, regular rhythm, normal heart sounds and intact distal pulses.   Pulmonary/Chest: Effort normal and breath sounds normal. She has no wheezes.  Abdominal: Soft. Bowel sounds are normal. She exhibits distension. She exhibits no mass. There is generalized tenderness. There is no rigidity and no guarding.  Abdomen is soft.  No fluid wave,  No localized pain. DRE nontender,  No impaction.  Musculoskeletal: Normal range of motion.  Neurological: She is alert.  Skin: Skin is warm and dry.  Psychiatric: She has a normal mood and affect.    ED Course  Procedures (including critical care time) Labs Review Labs Reviewed  URINE RAPID DRUG SCREEN (HOSP PERFORMED) - Abnormal; Notable for the following:    Benzodiazepines POSITIVE (*)    All other components within normal limits  COMPREHENSIVE METABOLIC PANEL - Abnormal; Notable for the following:    Albumin 3.3 (*)    Total Bilirubin 0.2 (*)    All other components within normal limits  CBC WITH DIFFERENTIAL - Abnormal; Notable for the following:    Neutrophils Relative % 34 (*)    Lymphocytes Relative 55 (*)    Lymphs Abs 4.1 (*)    All other components within normal limits  URINALYSIS, ROUTINE W REFLEX MICROSCOPIC  PREGNANCY, URINE  HCG, SERUM,  QUALITATIVE    Imaging Review No results found.   EKG Interpretation None      MDM   Final diagnoses:  None    Findings discussed with patient. She eats fiber regularly,  Drinks at least 100 oz of water daily,  Uses stool softeners, has tried miralax that actually made her more constipated.  Mag citrate given, will drink once home after inserting glycerin suppository (pt has).  F/u with pcp tomorrow if this does not relieve her constipation.   Evalee Jefferson, PA-C 10/11/13 [phone]

## 2013-10-11 NOTE — Discharge Instructions (Signed)
Constipation Constipation is when a person has fewer than three bowel movements a week, has difficulty having a bowel movement, or has stools that are dry, hard, or larger than normal. As people grow older, constipation is more common. If you try to fix constipation with medicines that make you have a bowel movement (laxatives), the problem may get worse. Long-term laxative use may cause the muscles of the colon to become weak. A low-fiber diet, not taking in enough fluids, and taking certain medicines may make constipation worse.  CAUSES   Certain medicines, such as antidepressants, pain medicine, iron supplements, antacids, and water pills.   Certain diseases, such as diabetes, irritable bowel syndrome (IBS), thyroid disease, or depression.   Not drinking enough water.   Not eating enough fiber-rich foods.   Stress or travel.   Lack of physical activity or exercise.   Ignoring the urge to have a bowel movement.   Using laxatives too much.  SIGNS AND SYMPTOMS   Having fewer than three bowel movements a week.   Straining to have a bowel movement.   Having stools that are hard, dry, or larger than normal.   Feeling full or bloated.   Pain in the lower abdomen.   Not feeling relief after having a bowel movement.  DIAGNOSIS  Your health care provider will take a medical history and perform a physical exam. Further testing may be done for severe constipation. Some tests may include:  A barium enema X-ray to examine your rectum, colon, and, sometimes, your small intestine.   A sigmoidoscopy to examine your lower colon.   A colonoscopy to examine your entire colon. TREATMENT  Treatment will depend on the severity of your constipation and what is causing it. Some dietary treatments include drinking more fluids and eating more fiber-rich foods. Lifestyle treatments may include regular exercise. If these diet and lifestyle recommendations do not help, your health care  provider may recommend taking over-the-counter laxative medicines to help you have bowel movements. Prescription medicines may be prescribed if over-the-counter medicines do not work.  HOME CARE INSTRUCTIONS   Eat foods that have a lot of fiber, such as fruits, vegetables, whole grains, and beans.  Limit foods high in fat and processed sugars, such as french fries, hamburgers, cookies, candies, and soda.   A fiber supplement may be added to your diet if you cannot get enough fiber from foods.   Drink enough fluids to keep your urine clear or pale yellow.   Exercise regularly or as directed by your health care provider.   Go to the restroom when you have the urge to go. Do not hold it.   Only take over-the-counter or prescription medicines as directed by your health care provider. Do not take other medicines for constipation without talking to your health care provider first.  [place] IF:   You have bright red blood in your stool.   Your constipation lasts for more than 4 days or gets worse.   You have abdominal or rectal pain.   You have thin, pencil-like stools.   You have unexplained weight loss. MAKE SURE YOU:   Understand these instructions.  Will watch your condition.  Will get help right away if you are not doing well or get worse. Document Released: 11/07/2003 Document Revised: 02/13/2013 Document Reviewed: 11/20/2012 [facility] Patient Information 2015 [place], [place]. This information is not intended to replace advice given to you by your health care provider. Make sure you discuss any questions  you have with your health care provider.   Use your glycerin suppositories, inserting 2 rectally.  Wait 20 minutes then drink one half of the magnesium citrate bottle (you may cool it with ice).  If you have not had a bowel movement within 30 minutes of drinking this,  Drink the other half.  Call your doctor in the morning if you have not had results  from this treatment.  You may also need to see a gastroenterologist as discussed.  Your primary doctor will need to refer you given your insurance type.  Discuss this with him and allow him to refer you if he feels this is appropriate for further evaluation of your chronic constipation.

## 2013-10-12 ENCOUNTER — Ambulatory Visit: Payer: Medicaid Other | Admitting: Nurse Practitioner

## 2013-10-17 ENCOUNTER — Ambulatory Visit: Payer: Medicaid Other | Admitting: Nurse Practitioner

## 2013-10-22 ENCOUNTER — Telehealth: Payer: Self-pay | Admitting: Family Medicine

## 2013-10-22 NOTE — Telephone Encounter (Signed)
Patient said that her constipation has subsided because she started taking baby food prunes and a multivitamin. She wants to know advice on how to prevent herself from getting compacted again?

## 2013-10-23 ENCOUNTER — Other Ambulatory Visit: Payer: Self-pay | Admitting: *Deleted

## 2013-10-23 ENCOUNTER — Telehealth: Payer: Self-pay | Admitting: Family Medicine

## 2013-10-23 MED ORDER — ALUMINUM CHLORIDE 20 % EX SOLN: Freq: Every day | CUTANEOUS | Status: DC

## 2013-10-23 NOTE — Telephone Encounter (Signed)
Requesting refill on drysol. Pt does not need a call back if you agree to refill. [facility]

## 2013-10-23 NOTE — Telephone Encounter (Signed)
Med sent to pharm. Pt notified.  

## 2013-10-23 NOTE — Telephone Encounter (Signed)
Having a regular diet that is rich in vegetables improved. Also fiber additives or stool softeners on a daily basis plus drinking plenty of water. May have to consider using MiraLax 1 capful in 8 ounces water daily. If ongoing troubles I would recommend office visit to discuss.

## 2013-10-23 NOTE — Telephone Encounter (Signed)
May refill x6

## 2013-10-23 NOTE — Telephone Encounter (Signed)
Patient needs new Rx for hypercare for her hands.   [facility]

## 2013-10-23 NOTE — Telephone Encounter (Signed)
Discussed with pt

## 2013-11-06 ENCOUNTER — Other Ambulatory Visit: Payer: Self-pay | Admitting: Family Medicine

## 2013-11-07 NOTE — Telephone Encounter (Signed)
Last seen 07/02/13

## 2013-11-22 ENCOUNTER — Ambulatory Visit: Payer: Medicaid Other | Admitting: Family Medicine

## 2013-11-30 ENCOUNTER — Other Ambulatory Visit: Payer: Self-pay | Admitting: Family Medicine

## 2013-11-30 ENCOUNTER — Telehealth: Payer: Self-pay | Admitting: Family Medicine

## 2013-11-30 MED ORDER — ALPRAZOLAM 1 MG PO TABS: ORAL_TABLET | ORAL | Status: DC

## 2013-11-30 NOTE — Telephone Encounter (Signed)
Patients pharmacy faxed [place] over an e-script for her xanax.  She is requesting that we send this in right now because she needs this right now.  I told her that I would notify the nurse, but it is completely up to the doctor whenever he gets a chance to sign it.

## 2013-11-30 NOTE — Telephone Encounter (Signed)
Appt scheduled for 12/20/13.

## 2013-11-30 NOTE — Telephone Encounter (Signed)
She may have one refill but she will need office visit before further refills I highly recommend that she schedule this now before all appointments are booked

## 2013-11-30 NOTE — Telephone Encounter (Signed)
Patient requesting refill of Adipex patient has been on Adipex 3/14-11/14 and 1/15-8/15- consult with Dr. Nicki Reaper and advised patient she needs to be off med for 4-6 months before she can restart. Patient has currently only been off med for 2 months. Xanax rx faxed to pharmacy. Patient notified and has follow up office visit scheduled 12/20/13.

## 2013-12-01 ENCOUNTER — Other Ambulatory Visit: Payer: Self-pay | Admitting: Family Medicine

## 2013-12-01 MED ORDER — ALPRAZOLAM 1 MG PO TABS
ORAL_TABLET | ORAL | Status: DC
Start: 2013-12-01 — End: 2013-12-31

## 2013-12-01 NOTE — Telephone Encounter (Signed)
Prescription resent because pt states pharmacy never got it I recommend in future pt get hard copy to bring herself.

## 2013-12-18 ENCOUNTER — Telehealth: Payer: Self-pay | Admitting: Family Medicine

## 2013-12-18 MED ORDER — CLOBETASOL PROPIONATE 0.05 % EX SOLN: CUTANEOUS | Status: DC

## 2013-12-18 NOTE — Telephone Encounter (Signed)
Ok plus three ref

## 2013-12-18 NOTE — Telephone Encounter (Signed)
Patient needs Rx for clobetasol (TEMOVATE) 0.05 % external solution to [facility].  She said she needs this RIGHT NOW.

## 2013-12-18 NOTE — Telephone Encounter (Signed)
Rx sent electronically to pharmacy. Patient notified. 

## 2013-12-18 NOTE — Telephone Encounter (Signed)
Patient waiting at pharmacy and wants ASAP

## 2013-12-20 ENCOUNTER — Ambulatory Visit: Payer: Medicaid Other | Admitting: Family Medicine

## 2013-12-21 ENCOUNTER — Ambulatory Visit: Payer: Medicaid Other | Admitting: Family Medicine

## 2013-12-21 DIAGNOSIS — Z029 Encounter for administrative examinations, unspecified: Secondary | ICD-10-CM

## 2013-12-29 ENCOUNTER — Other Ambulatory Visit: Payer: Self-pay | Admitting: Family Medicine

## 2013-12-31 ENCOUNTER — Other Ambulatory Visit: Payer: Self-pay | Admitting: Family Medicine

## 2013-12-31 ENCOUNTER — Telehealth: Payer: Self-pay | Admitting: Family Medicine

## 2013-12-31 MED ORDER — ALPRAZOLAM 1 MG PO TABS: ORAL_TABLET | ORAL | Status: DC

## 2013-12-31 NOTE — Telephone Encounter (Signed)
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  She is requesting to pick this up by 9:20 today.  I told her that we could do our best, but no promises on it being done that soon.  Also, nurses please clarify with patient if she wants to cancel her appointment on 01/08/14 and let me know. Thanks!

## 2013-12-31 NOTE — Telephone Encounter (Signed)
Patient notified and Dr. Nicki Reaper said he will not give her any more meds until she is seen. Pt verbalized understanding.

## 2014-01-07 ENCOUNTER — Encounter: Payer: Self-pay | Admitting: Family Medicine

## 2014-01-07 ENCOUNTER — Ambulatory Visit (INDEPENDENT_AMBULATORY_CARE_PROVIDER_SITE_OTHER): Payer: Medicaid Other | Admitting: Family Medicine

## 2014-01-07 VITALS — BP 128/78 | Ht 61.0 in | Wt 191.0 lb

## 2014-01-07 DIAGNOSIS — E669 Obesity, unspecified: Secondary | ICD-10-CM

## 2014-01-07 DIAGNOSIS — L74519 Primary focal hyperhidrosis, unspecified: Secondary | ICD-10-CM

## 2014-01-07 DIAGNOSIS — L409 Psoriasis, unspecified: Secondary | ICD-10-CM

## 2014-01-07 DIAGNOSIS — F411 Generalized anxiety disorder: Secondary | ICD-10-CM

## 2014-01-07 DIAGNOSIS — R61 Generalized hyperhidrosis: Secondary | ICD-10-CM

## 2014-01-07 MED ORDER — NORGESTIM-ETH ESTRAD TRIPHASIC 0.18/0.215/0.25 MG-35 MCG PO TABS: ORAL_TABLET | ORAL | Status: DC

## 2014-01-07 MED ORDER — ALBUTEROL SULFATE HFA 108 (90 BASE) MCG/ACT IN AERS: 2.0000 | INHALATION_SPRAY | Freq: Four times a day (QID) | RESPIRATORY_TRACT | Status: DC | PRN

## 2014-01-07 MED ORDER — ALUMINUM CHLORIDE 20 % EX SOLN: Freq: Every day | CUTANEOUS | Status: DC

## 2014-01-07 MED ORDER — ALPRAZOLAM 1 MG PO TABS: ORAL_TABLET | ORAL | Status: DC

## 2014-01-07 MED ORDER — CLOBETASOL PROPIONATE 0.05 % EX SOLN: CUTANEOUS | Status: DC

## 2014-01-07 NOTE — Progress Notes (Signed)
   Subjective:    Patient ID: Belinda [name], female    DOB: [birth year], 33 y.o.   MRN: [id number]  HPI Patient is here today for a med check.  She needs a refill on her Xanax, Adipex, and birth control.   She would like to be referred to a counselor for her depression. She denies being suicidal she wonders if medication will help her  Patient feels overall she is doing fairly good. She was hoping to get refill on medication to help her with weight loss but I told her that she would need to be off of the medicine for several more months  Review of Systems  Constitutional: Negative for activity change, appetite change and fatigue.  Gastrointestinal: Negative for abdominal pain.  Neurological: Negative for headaches.  Psychiatric/Behavioral: Negative for behavioral problems.       Objective:   Physical Exam  Constitutional: She appears well-nourished. No distress.  Cardiovascular: Normal rate, regular rhythm and normal heart sounds.   No murmur heard. Pulmonary/Chest: Effort normal and breath sounds normal. No respiratory distress.  Musculoskeletal: She exhibits no edema.  Lymphadenopathy:    She has no cervical adenopathy.  Neurological: She is alert. She exhibits normal muscle tone.  Psychiatric: Her behavior is normal.  Vitals reviewed.         Assessment & Plan:  adipex for wt loss no sooner than end of Jan  Anxiety- Xanax Rx with 5 refills MUST be seen before further Rx, refer psychiatry  Refills of other meds

## 2014-01-08 ENCOUNTER — Ambulatory Visit: Payer: Medicaid Other | Admitting: Family Medicine

## 2014-02-21 ENCOUNTER — Telehealth: Payer: Self-pay | Admitting: Family Medicine

## 2014-02-21 NOTE — Telephone Encounter (Signed)
Pt is requesting refills on hypercare. Pt is also requesting that the script  Be changed to a 15 day supply instead of a 30 day supply so that she  Can get two bottles a month instead of just one. Pt states that one bottle  isnt enough.  [facility]

## 2014-02-23 NOTE — Telephone Encounter (Signed)
Nurses please go ahead and call the patient. Verify what she needs. Then we can send it in

## 2014-02-25 ENCOUNTER — Telehealth: Payer: Self-pay | Admitting: Family Medicine

## 2014-02-25 MED ORDER — ALUMINUM CHLORIDE 20 % EX SOLN: Freq: Every day | CUTANEOUS | Status: DC

## 2014-02-25 NOTE — Telephone Encounter (Signed)
Refill sent to pharmacy. Left message on voicemail notifying patient.

## 2014-02-25 NOTE — Telephone Encounter (Signed)
Pt called stating that the wrong medicine was faxed over due to her giving The wrong name. The medicine that she is needing refills on is Drysol. Pt also needs  A form signed by scott sent to the pharmacy for medicaid so that medicaid will pay for it.

## 2014-03-08 NOTE — Telephone Encounter (Signed)
Pt's aluminum chloride (DRYSOL) 20 % external solution, is a non-covered medication, [employer] to state that she may pay out of pocket if she would like, but this is something that simply is not covered by Medicaid.

## 2014-03-11 ENCOUNTER — Telehealth: Payer: Self-pay | Admitting: Family Medicine

## 2014-03-11 NOTE — Telephone Encounter (Signed)
[place]

## 2014-03-11 NOTE — Telephone Encounter (Signed)
1) Pt would like to speak with Dr Bary Leriche nurse about a prior approval med, but  I have explained to some extent (15 mins worth of conversation) that is handled  Via [place] an the Pharmacy  2)  She also wants to speak about a cream she is using of her boyfriends,  triamcinolone acetonide ointment 0.1%  She feels the cream works good or better than what she is using for her  Psoriasis I have explained Dr Nicki Reaper if out of the office an we would most likely  Need to speak with him about switching out the cream   [facility]

## 2014-03-12 NOTE — Telephone Encounter (Signed)
Patient called 03/11/14 insisting that Dr. Nicki Reaper always gets this medication approved for her, I tried to explain that it was no longer covered, she still insists that if Dr. Nicki Reaper will complete form and send in that it will be covered.  Called [employer] today, spoke with Maggie who informed me that this medication is no longer covered by Medicaid.  Called pt's boyfriend's cell# [phone] and left a detailed message that this medicine is something that Medicaid no longer pays for.

## 2014-03-12 NOTE — Telephone Encounter (Signed)
[facility]

## 2014-03-13 ENCOUNTER — Telehealth: Payer: Self-pay | Admitting: Family Medicine

## 2014-03-13 NOTE — Telephone Encounter (Signed)
Pt is needing a note saying that she cant do community service  Due to her PTSD. 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

## 2014-03-13 NOTE — Telephone Encounter (Signed)
I am unwilling to do that. Please discuss with patient.

## 2014-03-18 NOTE — Telephone Encounter (Signed)
Patient has been called and messages left several times.

## 2014-04-11 ENCOUNTER — Telehealth: Payer: Self-pay | Admitting: Family Medicine

## 2014-04-11 NOTE — Telephone Encounter (Signed)
Wellbutrin XL 150 mg, #30, no refills, follow-up office visit within the next 5-10 days.(Next available standard office visit) as for the hydrosis I do not have any recommendations if that medicine is not covered.

## 2014-04-11 NOTE — Telephone Encounter (Signed)
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 Pt would like To be put on something that is covered. Pt also wants to know if she can Go back on the wellbutrin.

## 2014-04-12 ENCOUNTER — Telehealth: Payer: Self-pay | Admitting: Family Medicine

## 2014-04-12 MED ORDER — BUPROPION HCL ER (XL) 150 MG PO TB24: 150.0000 mg | ORAL_TABLET | Freq: Every day | ORAL | Status: DC

## 2014-04-12 NOTE — Telephone Encounter (Signed)
Pt called upset about her medicine for her hydrosis. Pt stated that she called medicaid And the lady at [facility] informed her that we were not doing our jobs regarding getting Her medicine covered. Pt was also upset that she was unable to immediately talk to Dr.  Nicki Reaper or his nurse. Pt stated that she was going to continue to call here every twenty  Minutes until she was able to talk to someone willing to help her. I was on the phone with  Her for at least 30 minutes trying to inform her that she could discuss her meds at her  Next appt. Pt was not satisfied with what I was telling her.

## 2014-04-12 NOTE — Telephone Encounter (Signed)
Pt notified and verbalized understanding that she needs a f/u OV. Refilled Wellbutrin. Explained to patient that Drysol was no longer covered and that she could call the # on the back of her Medicaid card. She did not have it, so I gave her the # along with her member ID #. Transferred her up front to schedule OV.

## 2014-04-14 ENCOUNTER — Other Ambulatory Visit: Payer: Self-pay | Admitting: Family Medicine

## 2014-04-14 MED ORDER — ALUMINUM CHLORIDE 20 % EX SOLN: Freq: Every day | CUTANEOUS | Status: DC

## 2014-04-14 NOTE — Telephone Encounter (Signed)
Nurses #1 call the patient #2 briefly and politely let the patient know that we are trying to get Drysol covered. I have sent the prescription back in. If it is refused the pharmacy is to let us know who in turn our staff member Izora Ribas will help handle an appeal. #3 inform the patient we will discuss this further at her office visit in early March #4 that if Medicaid will not cover her medicine then I highly recommend that we will send her to a dermatologist who will be more than qualified to care for this patient's hyperhidrosis. (Be polite, but be to the point) we certainly try to help all of our patients but when their insurance refuses medications we cannot always get the insurance company to change their rules.

## 2014-04-15 NOTE — Telephone Encounter (Signed)
[facility]

## 2014-04-15 NOTE — Telephone Encounter (Signed)
Per Izora Ribas, This is a non coverable drug and cannot be covered under any circumstances. Izora Ribas has talked to this patient in the past and she is aware of this.

## 2014-04-15 NOTE — Telephone Encounter (Signed)
Let the patient know that Belinda [name] has tried to get it covered and was unable to. We cannot control what Medicaid does. I will be happy to refer this patient to a dermatologist. Possibly a specialist can get some things done that a primary care doctor cannot. I would recommend that you call the patient, be polite, B2 the point, make the referral to dermatology

## 2014-04-23 ENCOUNTER — Telehealth: Payer: Self-pay | Admitting: Family Medicine

## 2014-04-23 NOTE — Telephone Encounter (Signed)
Pt called stating that she is having some swelling and is wanting To know if it is a side effect of the wellbutrin.

## 2014-04-24 ENCOUNTER — Ambulatory Visit: Payer: Medicaid Other | Admitting: Family Medicine

## 2014-04-25 ENCOUNTER — Telehealth: Payer: Self-pay | Admitting: Family Medicine

## 2014-04-25 NOTE — Telephone Encounter (Signed)
Patient has office visit with you 05/08/14

## 2014-04-25 NOTE — Telephone Encounter (Signed)
See other message 04/25/14

## 2014-04-25 NOTE — Telephone Encounter (Signed)
See other Message from 04/25/14 that addresses same concern.

## 2014-04-25 NOTE — Telephone Encounter (Signed)
Patient has office visit 05/08/14.

## 2014-04-25 NOTE — Telephone Encounter (Signed)
Also, patient is inquiring about the dry sol that she would like to be filled.  Belinda [name] has checked with local pharmacist and they said that there is nothing like that medication covered under that diagnosis that she needs it for.  Would you like to make appointment to discuss options with patient?

## 2014-04-25 NOTE — Telephone Encounter (Signed)
If the patient would like to be switched to Effexor then #1 stop Wellbutrin #2 start Effexor XR 75. As for the Drysol I am clueless. I do not know of any other options. If this is not covered by Medicaid which I'm being told by Izora Ribas it is not covered then the patient's only other option is she can go see a dermatologist. If she needs a referral put in a referral in send it for. There is no need for [place] to handle issues regarding this any further. Keep appointment later in the month

## 2014-04-25 NOTE — Telephone Encounter (Signed)
This message added and combine in the 04/25/14 phone message- See 04/25/14 phone message

## 2014-04-25 NOTE — Telephone Encounter (Signed)
Left message to return call 

## 2014-04-25 NOTE — Telephone Encounter (Signed)
First please verify what is meant by swelling. Swelling in the ankles? Hives? If having hives or angioedema then immediately stop medication. If it is just swelling in the ankles then I am not familiar with Wellbutrin causing swelling. I'm sure their many different things that can cause swelling. If the patient feels uneasy about this she can stop the medicine.

## 2014-04-25 NOTE — Telephone Encounter (Signed)
Patient says that the wellbutrin is causing her to swell.  She says that her family members are doing good on effexor.  She wants to know if she can change to effexor because her and her family members are so genetically similar.

## 2014-04-25 NOTE — Telephone Encounter (Addendum)
Let the patient know that Belinda [name] has tried to get it covered and was unable to. We cannot control what Medicaid does. I will be happy to refer this patient to a dermatologist. Possibly a specialist can get some things done that a primary care doctor cannot. I would recommend that you call the patient, be polite, B2 the point, make the referral to

## 2014-05-08 ENCOUNTER — Encounter: Payer: Self-pay | Admitting: Family Medicine

## 2014-05-08 ENCOUNTER — Ambulatory Visit (INDEPENDENT_AMBULATORY_CARE_PROVIDER_SITE_OTHER): Payer: Medicaid Other | Admitting: Family Medicine

## 2014-05-08 VITALS — BP 128/82 | Ht 61.0 in | Wt 211.0 lb

## 2014-05-08 DIAGNOSIS — E669 Obesity, unspecified: Secondary | ICD-10-CM

## 2014-05-08 DIAGNOSIS — D239 Other benign neoplasm of skin, unspecified: Secondary | ICD-10-CM

## 2014-05-08 DIAGNOSIS — F411 Generalized anxiety disorder: Secondary | ICD-10-CM | POA: Diagnosis not present

## 2014-05-08 DIAGNOSIS — M25552 Pain in left hip: Secondary | ICD-10-CM | POA: Diagnosis not present

## 2014-05-08 DIAGNOSIS — D229 Melanocytic nevi, unspecified: Secondary | ICD-10-CM

## 2014-05-08 MED ORDER — BUPROPION HCL ER (XL) 150 MG PO TB24: 150.0000 mg | ORAL_TABLET | Freq: Every day | ORAL | Status: DC

## 2014-05-08 MED ORDER — DICLOFENAC SODIUM 75 MG PO TBEC: 75.0000 mg | DELAYED_RELEASE_TABLET | Freq: Two times a day (BID) | ORAL | Status: DC

## 2014-05-08 MED ORDER — ALPRAZOLAM 1 MG PO TABS: ORAL_TABLET | ORAL | Status: DC

## 2014-05-08 MED ORDER — PHENTERMINE HCL 37.5 MG PO CAPS: 37.5000 mg | ORAL_CAPSULE | ORAL | Status: DC

## 2014-05-08 NOTE — Progress Notes (Signed)
   Subjective:    Patient ID: Belinda [name], female    DOB: [birth year], 34 y.o.   MRN: [id number]  HPI  Patient arrives for a follow up on anxiety. Patient also having pain in hips. Patient concerned Wellbutrin causing her to gain weight Patient relates she is gaining weight despite trying to watch what she eats she does not exercise quite as much as she thinks she shouldshe will try to do better she finds herself stressed out a lot. Denies being depressed but does have intermittent depression symptoms Patient also relates hip pain discomfort when she walks she feels like she cannot do community service because of this. I was sympathetic to her bed at the same time a told her that I cannot control what goes on in regards to [place]. Greater than 25 minutes was spent discussing her weight hip problems anxiety issues patient with numerous questions throughout the whole visit. Review of Systems     Objective:   Physical Exam  Lungs are clear hearts regular left hip subjected discomfort withlateral extension and rotation no weakness noted      Assessment & Plan:  Left hip tendinitis anti-inflammatory over the next 14-21 day should gradually get better  Significant amount of anxiety issues with overlapping panic issues as well as depression patient denies being suicidal. May continue Xanax 4 times a day as necessary. I also recommend continuing the Wellbutrin at least give it 60 days to see how that does if not helping enough she may call back we will switch it to Effexor  Morbid obesity patient very stressed out about her weight may use Adipex for the next 3 months but then will need to take a 6 month break. She may well be a candidate for gastric bypass surgery if she continues to gain weight. Difficult issue with this patient  Abnormal mole on her chest I don't feel it's cancerous it gets caught on her clothing causes bleeding referral to dermatology for removal  25 minutes  spent with patient multiple different issues going on greater than half the time spent discussing these issues

## 2014-05-09 ENCOUNTER — Telehealth: Payer: Self-pay | Admitting: Family Medicine

## 2014-05-09 NOTE — Telephone Encounter (Signed)
Not a surprise, choices Rx Naprosyn 500 bid or mobic 15 mg qd, once pt decides send in 30 day with 5 rf and change meds in epic. ( I prefer Naprosyn)

## 2014-05-09 NOTE — Telephone Encounter (Signed)
Pt's diclofenac (VOLTAREN) 75 MG EC tablet is Non-Preferred on the Medicaid PDL, please see list of preferred meds in red folder, pt must fail 2 preferred meds before will be able to get this approved, please advise

## 2014-05-10 NOTE — Telephone Encounter (Signed)
Left message with gentleman to have patient return my call.

## 2014-05-14 NOTE — Telephone Encounter (Signed)
[facility] with gentleman

## 2014-05-15 NOTE — Telephone Encounter (Signed)
[facility] at home #

## 2014-05-21 ENCOUNTER — Encounter: Payer: Self-pay | Admitting: Family Medicine

## 2014-05-27 NOTE — Telephone Encounter (Signed)
Multiple attempts made to contact patient. Patient has not returned calls.

## 2014-05-27 NOTE — Telephone Encounter (Signed)
May use naprosyn 500, 1 bid #60

## 2014-05-28 MED ORDER — NAPROXEN 500 MG PO TABS: 500.0000 mg | ORAL_TABLET | Freq: Two times a day (BID) | ORAL | Status: DC

## 2014-05-28 NOTE — Telephone Encounter (Signed)
Rx sent electronically to pharmacy.

## 2014-07-03 ENCOUNTER — Telehealth: Payer: Self-pay | Admitting: Family Medicine

## 2014-07-03 NOTE — Telephone Encounter (Signed)
Referral to dermatology already in progress=they are waiting for patient to call them to schedule appt. Phone number to dermatology was given to patient.

## 2014-07-03 NOTE — Telephone Encounter (Signed)
Patient says that she is having an issue with an increased number of psoriasis flare ups.  She said she is having a lot of joint pain especially in her hips.  She wants to know if you will prescribe [place] Nail (which she says is FDA approved and the only med she thinks she wants to try after extensive research.)   [facility]

## 2014-07-03 NOTE — Telephone Encounter (Signed)
I rec dermatology for any oral meds for psoriasis

## 2014-07-05 ENCOUNTER — Other Ambulatory Visit: Payer: Self-pay | Admitting: Family Medicine

## 2014-07-08 ENCOUNTER — Telehealth: Payer: Self-pay | Admitting: Family Medicine

## 2014-07-08 NOTE — Telephone Encounter (Signed)
Pt notified med ready for pickup.

## 2014-07-08 NOTE — Telephone Encounter (Signed)
Pt is needing a refill on her xanax pt came today thinking her appt was today When in fact it is next Monday. Pt states she has been out of her meds since The 13th and is wanting to be able to pick them up while she has a ride today. Pt states her car is in [place] being worked on.

## 2014-07-08 NOTE — Telephone Encounter (Signed)
May send in this +2 additional refills

## 2014-07-09 ENCOUNTER — Encounter: Payer: Self-pay | Admitting: Family Medicine

## 2014-07-09 LAB — LIPID PANEL
Chol/HDL Ratio: 4 ratio units (ref 0.0–4.4)
Cholesterol, Total: 163 mg/dL (ref 100–199)
HDL: 41 mg/dL (ref >39)
LDL Calculated: 86 mg/dL (ref 0–99)
Triglycerides: 182 mg/dL — ABNORMAL HIGH (ref 0–149)
VLDL Cholesterol Cal: 36 mg/dL (ref 5–40)

## 2014-07-09 LAB — TSH: TSH: 3.84 u[IU]/mL (ref 0.450–4.500)

## 2014-07-09 LAB — GLUCOSE, RANDOM: Glucose: 101 mg/dL — ABNORMAL HIGH (ref 65–99)

## 2014-07-15 ENCOUNTER — Ambulatory Visit: Payer: Medicaid Other | Admitting: Family Medicine

## 2014-07-26 ENCOUNTER — Ambulatory Visit (INDEPENDENT_AMBULATORY_CARE_PROVIDER_SITE_OTHER): Payer: Medicaid Other | Admitting: Family Medicine

## 2014-07-26 ENCOUNTER — Ambulatory Visit (HOSPITAL_COMMUNITY)
Admission: RE | Admit: 2014-07-26 | Discharge: 2014-07-26 | Disposition: A | Discharge status: Home / Self Care | Payer: Medicaid Other | Source: Ambulatory Visit | Attending: Family Medicine | Admitting: Family Medicine

## 2014-07-26 ENCOUNTER — Encounter: Payer: Self-pay | Admitting: Family Medicine

## 2014-07-26 VITALS — BP 120/70 | Ht 61.0 in | Wt 215.4 lb

## 2014-07-26 DIAGNOSIS — F411 Generalized anxiety disorder: Secondary | ICD-10-CM | POA: Diagnosis not present

## 2014-07-26 DIAGNOSIS — R739 Hyperglycemia, unspecified: Secondary | ICD-10-CM

## 2014-07-26 DIAGNOSIS — W182XXA Fall in (into) shower or empty bathtub, initial encounter: Secondary | ICD-10-CM | POA: Insufficient documentation

## 2014-07-26 DIAGNOSIS — E669 Obesity, unspecified: Secondary | ICD-10-CM | POA: Diagnosis not present

## 2014-07-26 DIAGNOSIS — M25532 Pain in left wrist: Secondary | ICD-10-CM

## 2014-07-26 DIAGNOSIS — L409 Psoriasis, unspecified: Secondary | ICD-10-CM | POA: Diagnosis not present

## 2014-07-26 DIAGNOSIS — S6982XA Other specified injuries of left wrist, hand and finger(s), initial encounter: Secondary | ICD-10-CM | POA: Insufficient documentation

## 2014-07-26 DIAGNOSIS — Y92012 Bathroom of single-family (private) house as the place of occurrence of the external cause: Secondary | ICD-10-CM | POA: Diagnosis not present

## 2014-07-26 LAB — POCT GLYCOSYLATED HEMOGLOBIN (HGB A1C): Hemoglobin A1C: 5

## 2014-07-26 MED ORDER — AMOXICILLIN 500 MG PO TABS: 500.0000 mg | ORAL_TABLET | Freq: Three times a day (TID) | ORAL | Status: DC

## 2014-07-26 NOTE — Progress Notes (Signed)
   Subjective:    Patient ID: Belinda [name], female    DOB: [birth year], 34 y.o.   MRN: [id number]  Anxiety Presents for follow-up visit. Symptoms occur most days. The severity of symptoms is interfering with daily activities. Nighttime awakenings: occasional.   Compliance with medications is 76-100%.   Patient with severe anxiety issues. Finding herself feeling anxious. Denies being depressed. Worried about a lot of things. Has psoriasis. Battles with obesity.   Review of Systems She denies chest tightness pressure pain shortness breath nausea vomiting diarrhea.    Objective:   Physical Exam  Lungs are clear hearts regular psoriasis noted on the arms and the lower legs not severe Patient has an infected tooth although the gum looks good  Greater than 25 minutes spent with patient     Assessment & Plan:  Patient has severe anxiety issuesI would like to see the patient unless medication but I do feel that that's realistic. I told the patient she cannot go above her current dose. Also told the patient that I believe she would benefit from being on antidepressive but she is tried Celexa and well putrid and had side effects and does not want to be on any additional medicines  The patient does not one of be under the care of a psychiatrist but she does consent to seeing a psychologist for counseling and possible further diagnosis. Patient was told that if it is felt that she has underlying bipolar disease then it would be to her best advantage to see a psychiatrist.  Obesity-patient was counseled to quit smoking eat healthy and stay physically active  We will not be using appetite suppressants for this patient Psoriasis use clobetasol. If interested we can refer her to dermatology  Patient was counseled the importance of keeping her appointments

## 2014-08-05 ENCOUNTER — Encounter: Payer: Self-pay | Admitting: Family Medicine

## 2014-08-17 ENCOUNTER — Other Ambulatory Visit: Payer: Self-pay | Admitting: Family Medicine

## 2014-08-19 NOTE — Telephone Encounter (Signed)
Absolutely not, this was discussed before that we will never be using this medicine

## 2014-08-28 ENCOUNTER — Telehealth (HOSPITAL_COMMUNITY): Payer: Self-pay | Admitting: *Deleted

## 2014-08-28 ENCOUNTER — Telehealth: Payer: Self-pay | Admitting: Family Medicine

## 2014-08-28 NOTE — Telephone Encounter (Signed)
FYI - pt was a "No Show" for her appointment today with Dr. Nehemiah Massed - dermatology  (a referral we worked on since March 2016 for her)

## 2014-08-28 NOTE — Telephone Encounter (Signed)
Not surprising, so noted

## 2014-10-03 ENCOUNTER — Other Ambulatory Visit: Payer: Self-pay | Admitting: Family Medicine

## 2014-10-04 NOTE — Telephone Encounter (Signed)
May have this prescription +2 refills. Please put on prescription that each must last 30 days

## 2014-10-31 ENCOUNTER — Ambulatory Visit: Payer: Medicaid Other | Admitting: Family Medicine

## 2014-11-08 ENCOUNTER — Encounter: Payer: Self-pay | Admitting: Family Medicine

## 2014-11-26 ENCOUNTER — Ambulatory Visit: Payer: Medicaid Other | Admitting: Family Medicine

## 2014-12-18 ENCOUNTER — Ambulatory Visit: Payer: Medicaid Other | Admitting: Family Medicine

## 2014-12-30 ENCOUNTER — Encounter: Payer: Self-pay | Admitting: Family Medicine

## 2014-12-30 ENCOUNTER — Ambulatory Visit (INDEPENDENT_AMBULATORY_CARE_PROVIDER_SITE_OTHER): Payer: Medicaid Other | Admitting: Family Medicine

## 2014-12-30 VITALS — BP 110/88 | Ht 61.0 in | Wt 226.0 lb

## 2014-12-30 DIAGNOSIS — F411 Generalized anxiety disorder: Secondary | ICD-10-CM | POA: Diagnosis not present

## 2014-12-30 DIAGNOSIS — E669 Obesity, unspecified: Secondary | ICD-10-CM

## 2014-12-30 MED ORDER — ALPRAZOLAM 1 MG PO TABS: 1.0000 mg | ORAL_TABLET | Freq: Four times a day (QID) | ORAL | Status: DC | PRN

## 2014-12-30 MED ORDER — AMOXICILLIN 500 MG PO TABS: 500.0000 mg | ORAL_TABLET | Freq: Three times a day (TID) | ORAL | Status: DC

## 2014-12-30 MED ORDER — PHENTERMINE HCL 37.5 MG PO TABS: 37.5000 mg | ORAL_TABLET | Freq: Every day | ORAL | Status: DC

## 2014-12-30 NOTE — Progress Notes (Signed)
   Subjective:    Patient ID: Belinda [name], female    DOB: 03/09/1980, 34 y.o.   MRN: [id number]  HPIMed check up.  Pt wants to start back on phentermine.   Needs refill on xanax and prilosec.   Declines flu vaccine.   patient very frustrated with her weight. Frustrated and how things are going. She does try to watch her diet.  she relates some left ear pain intermittently  Review of Systems  patient relates being anxious nervous at times feeling stress. Denies being depressed. Denies hallucinations. Denies being suicidal.    Objective:   Physical Exam   lungs clear heart regular ears are normal  extremities no edema     Assessment & Plan:   chronic anxiety Xanax with refills given follow-up in a proximally 3-4 months    weight gain-will use Adipex not for long-term use ready days 1 refill. Patient was told her best path toward weight loss is exercising watching diet   I believe this patient would benefit from seeing psychiatry. We will try to refer patient to see a female psychiatrist. Possible bipolar

## 2015-01-03 ENCOUNTER — Encounter: Payer: Self-pay | Admitting: Family Medicine

## 2015-01-10 ENCOUNTER — Other Ambulatory Visit: Payer: Self-pay | Admitting: Family Medicine

## 2015-01-18 ENCOUNTER — Other Ambulatory Visit: Payer: Self-pay | Admitting: Family Medicine

## 2015-02-27 ENCOUNTER — Telehealth: Payer: Self-pay | Admitting: Family Medicine

## 2015-02-27 ENCOUNTER — Other Ambulatory Visit: Payer: Self-pay

## 2015-02-27 MED ORDER — ALPRAZOLAM 1 MG PO TABS: 1.0000 mg | ORAL_TABLET | Freq: Four times a day (QID) | ORAL | Status: DC | PRN

## 2015-02-27 NOTE — Telephone Encounter (Signed)
Pt states that [facility] refuses to fill any further scripts for this patient  Due to her complaining of discrepancies with her script. [place]  [place] [facility] an now they will not release  Her last script due to all the turmoil  She is asking for another script to be written for her to pick up today to take [facility] is out as of today

## 2015-02-27 NOTE — Telephone Encounter (Signed)
May have rf this and 1 additional , f/u ov in 2 months

## 2015-02-27 NOTE — Telephone Encounter (Signed)
Please verify when she last filled her prescription. If possible

## 2015-02-27 NOTE — Telephone Encounter (Signed)
Last filled 01/27/15

## 2015-02-27 NOTE — Telephone Encounter (Signed)
Patient notified she can pickup script. Patient has appointment at the end of the month.

## 2015-02-27 NOTE — Telephone Encounter (Signed)
She is needing her xanax

## 2015-03-02 ENCOUNTER — Other Ambulatory Visit: Payer: Self-pay | Admitting: Family Medicine

## 2015-03-24 ENCOUNTER — Encounter: Payer: Self-pay | Admitting: Family Medicine

## 2015-03-24 ENCOUNTER — Ambulatory Visit (INDEPENDENT_AMBULATORY_CARE_PROVIDER_SITE_OTHER): Payer: Medicaid Other | Admitting: Family Medicine

## 2015-03-24 VITALS — BP 114/76 | Ht 61.0 in | Wt 218.0 lb

## 2015-03-24 DIAGNOSIS — Z79899 Other long term (current) drug therapy: Secondary | ICD-10-CM

## 2015-03-24 DIAGNOSIS — R61 Generalized hyperhidrosis: Secondary | ICD-10-CM

## 2015-03-24 DIAGNOSIS — E781 Pure hyperglyceridemia: Secondary | ICD-10-CM | POA: Diagnosis not present

## 2015-03-24 DIAGNOSIS — R739 Hyperglycemia, unspecified: Secondary | ICD-10-CM | POA: Diagnosis not present

## 2015-03-24 DIAGNOSIS — K219 Gastro-esophageal reflux disease without esophagitis: Secondary | ICD-10-CM | POA: Diagnosis not present

## 2015-03-24 DIAGNOSIS — L74519 Primary focal hyperhidrosis, unspecified: Secondary | ICD-10-CM | POA: Diagnosis not present

## 2015-03-24 DIAGNOSIS — F411 Generalized anxiety disorder: Secondary | ICD-10-CM | POA: Diagnosis not present

## 2015-03-24 MED ORDER — OMEPRAZOLE 20 MG PO CPDR: 20.0000 mg | DELAYED_RELEASE_CAPSULE | Freq: Every day | ORAL | Status: DC

## 2015-03-24 MED ORDER — ALUMINUM CHLORIDE 20 % EX SOLN: Freq: Every day | CUTANEOUS | Status: DC

## 2015-03-24 MED ORDER — ALBUTEROL SULFATE HFA 108 (90 BASE) MCG/ACT IN AERS: 2.0000 | INHALATION_SPRAY | Freq: Four times a day (QID) | RESPIRATORY_TRACT | Status: DC | PRN

## 2015-03-24 MED ORDER — PHENTERMINE HCL 37.5 MG PO TABS: 37.5000 mg | ORAL_TABLET | Freq: Every day | ORAL | Status: DC

## 2015-03-24 MED ORDER — ALPRAZOLAM 1 MG PO TABS: 1.0000 mg | ORAL_TABLET | Freq: Four times a day (QID) | ORAL | Status: DC | PRN

## 2015-03-24 NOTE — Patient Instructions (Signed)
Diabetes Mellitus and Food It is important for you to manage your blood sugar (glucose) level. Your blood glucose level can be greatly affected by what you eat. Eating healthier foods in the appropriate amounts throughout the day at about the same time each day will help you control your blood glucose level. It can also help slow or prevent worsening of your diabetes mellitus. Healthy eating may even help you improve the level of your blood pressure and reach or maintain a healthy weight.  General recommendations for healthful eating and cooking habits include:  Eating meals and snacks regularly. Avoid going long periods of time without eating to lose weight.  Eating a diet that consists mainly of plant-based foods, such as fruits, vegetables, nuts, legumes, and whole grains.  Using low-heat cooking methods, such as baking, instead of high-heat cooking methods, such as deep frying. Work with your dietitian to make sure you understand how to use the Nutrition Facts information on food labels. HOW CAN FOOD AFFECT ME? Carbohydrates Carbohydrates affect your blood glucose level more than any other type of food. Your dietitian will help you determine how many carbohydrates to eat at each meal and teach you how to count carbohydrates. Counting carbohydrates is important to keep your blood glucose at a healthy level, especially if you are using insulin or taking certain medicines for diabetes mellitus. Alcohol Alcohol can cause sudden decreases in blood glucose (hypoglycemia), especially if you use insulin or take certain medicines for diabetes mellitus. Hypoglycemia can be a life-threatening condition. Symptoms of hypoglycemia (sleepiness, dizziness, and disorientation) are similar to symptoms of having too much alcohol.  If your health care provider has given you approval to drink alcohol, do so in moderation and use the following guidelines:  Women should not have more than one drink per day, and men  should not have more than two drinks per day. One drink is equal to:  12 oz of beer.  5 oz of wine.  1 oz of hard liquor.  Do not drink on an empty stomach.  Keep yourself hydrated. Have water, diet soda, or unsweetened iced tea.  Regular soda, juice, and other mixers might contain a lot of carbohydrates and should be counted. WHAT FOODS ARE NOT RECOMMENDED? As you make food choices, it is important to remember that all foods are not the same. Some foods have fewer nutrients per serving than other foods, even though they might have the same number of calories or carbohydrates. It is difficult to get your body what it needs when you eat foods with fewer nutrients. Examples of foods that you should avoid that are high in calories and carbohydrates but low in nutrients include:  Trans fats (most processed foods list trans fats on the Nutrition Facts label).  Regular soda.  Juice.  Candy.  Sweets, such as cake, pie, doughnuts, and cookies.  Fried foods. WHAT FOODS CAN I EAT? Eat nutrient-rich foods, which will nourish your body and keep you healthy. The food you should eat also will depend on several factors, including:  The calories you need.  The medicines you take.  Your weight.  Your blood glucose level.  Your blood pressure level.  Your cholesterol level. You should eat a variety of foods, including:  Protein.  Lean cuts of meat.  Proteins low in saturated fats, such as fish, egg whites, and beans. Avoid processed meats.  Fruits and vegetables.  Fruits and vegetables that may help control blood glucose levels, such as apples, mangoes, and  yams.  Dairy products.  Choose fat-free or low-fat dairy products, such as milk, yogurt, and cheese.  Grains, bread, pasta, and rice.  Choose whole grain products, such as multigrain bread, whole oats, and brown rice. These foods may help control blood pressure.  Fats.  Foods containing healthful fats, such as nuts,  avocado, olive oil, canola oil, and fish. DOES EVERYONE WITH DIABETES MELLITUS HAVE THE SAME MEAL PLAN? Because every person with diabetes mellitus is different, there is not one meal plan that works for everyone. It is very important that you meet with a dietitian who will help you create a meal plan that is just right for you.   This information is not intended to replace advice given to you by your health care provider. Make sure you discuss any questions you have with your health care provider.   Document Released: 11/05/2004 Document Revised: 03/01/2014 Document Reviewed: 01/05/2013 Elsevier Interactive Patient Education 2016 [place]. Gastroesophageal Reflux Disease, Adult Normally, food travels down the esophagus and stays in the stomach to be digested. However, when a person has gastroesophageal reflux disease (GERD), food and stomach acid move back up into the esophagus. When this happens, the esophagus becomes sore and inflamed. Over time, GERD can create small holes (ulcers) in the lining of the esophagus.  CAUSES This condition is caused by a problem with the muscle between the esophagus and the stomach (lower esophageal sphincter, or LES). Normally, the LES muscle closes after food passes through the esophagus to the stomach. When the LES is weakened or abnormal, it does not close properly, and that allows food and stomach acid to go back up into the esophagus. The LES can be weakened by certain dietary substances, medicines, and medical conditions, including:  Tobacco use.  Pregnancy.  Having a hiatal hernia.  Heavy alcohol use.  Certain foods and beverages, such as coffee, chocolate, onions, and peppermint. RISK FACTORS This condition is more likely to develop in:  People who have an increased body weight.  People who have connective tissue disorders.  People who use NSAID medicines. SYMPTOMS Symptoms of this condition include:  Heartburn.  Difficult or painful  swallowing.  The feeling of having a lump in the throat.  Abitter taste in the mouth.  Bad breath.  Having a large amount of saliva.  Having an upset or bloated stomach.  Belching.  Chest pain.  Shortness of breath or wheezing.  Ongoing (chronic) cough or a night-time cough.  Wearing away of tooth enamel.  Weight loss. Different conditions can cause chest pain. Make sure to see your health care provider if you experience chest pain. DIAGNOSIS Your health care provider will take a medical history and perform a physical exam. To determine if you have mild or severe GERD, your health care provider may also monitor how you respond to treatment. You may also have other tests, including:  An endoscopy toexamine your stomach and esophagus with a small camera.  A test thatmeasures the acidity level in your esophagus.  A test thatmeasures how much pressure is on your esophagus.  A barium swallow or modified barium swallow to show the shape, size, and functioning of your esophagus. TREATMENT The goal of treatment is to help relieve your symptoms and to prevent complications. Treatment for this condition may vary depending on how severe your symptoms are. Your health care provider may recommend:  Changes to your diet.  Medicine.  Surgery. HOME CARE INSTRUCTIONS Diet  Follow a diet as recommended by your  health care provider. This may involve avoiding foods and drinks such as:  Coffee and tea (with or without caffeine).  Drinks that containalcohol.  Energy drinks and sports drinks.  Carbonated drinks or sodas.  Chocolate and cocoa.  Peppermint and mint flavorings.  Garlic and onions.  Horseradish.  Spicy and acidic foods, including peppers, chili powder, curry powder, vinegar, hot sauces, and barbecue sauce.  Citrus fruit juices and citrus fruits, such as oranges, lemons, and limes.  Tomato-based foods, such as red sauce, chili, salsa, and pizza with red  sauce.  Fried and fatty foods, such as donuts, french fries, potato chips, and high-fat dressings.  High-fat meats, such as hot dogs and fatty cuts of red and white meats, such as rib eye steak, sausage, ham, and bacon.  High-fat dairy items, such as whole milk, butter, and cream cheese.  Eat small, frequent meals instead of large meals.  Avoid drinking large amounts of liquid with your meals.  Avoid eating meals during the 2-3 hours before bedtime.  Avoid lying down right after you eat.  Do not exercise right after you eat. General Instructions  Pay attention to any changes in your symptoms.  Take over-the-counter and prescription medicines only as told by your health care provider. Do not take aspirin, ibuprofen, or other NSAIDs unless your health care provider told you to do so.  Do not use any tobacco products, including cigarettes, chewing tobacco, and e-cigarettes. If you need help quitting, ask your health care provider.  Wear loose-fitting clothing. Do not wear anything tight around your waist that causes pressure on your abdomen.  Raise (elevate) the head of your bed 6 inches (15cm).  Try to reduce your stress, such as with yoga or meditation. If you need help reducing stress, ask your health care provider.  If you are overweight, reduce your weight to an amount that is healthy for you. Ask your health care provider for guidance about a safe weight loss goal.  Keep all follow-up visits as told by your health care provider. This is important. SEEK MEDICAL CARE IF:  You have new symptoms.  You have unexplained weight loss.  You have difficulty swallowing, or it hurts to swallow.  You have wheezing or a persistent cough.  Your symptoms do not improve with treatment.  You have a hoarse voice. SEEK IMMEDIATE MEDICAL CARE IF:  You have pain in your arms, neck, jaw, teeth, or back.  You feel sweaty, dizzy, or light-headed.  You have chest pain or shortness of  breath.  You vomit and your vomit looks like blood or coffee grounds.  You faint.  Your stool is bloody or black.  You cannot swallow, drink, or eat.   This information is not intended to replace advice given to you by your health care provider. Make sure you discuss any questions you have with your health care provider.   Document Released: 11/18/2004 Document Revised: 10/30/2014 Document Reviewed: 06/05/2014 Elsevier Interactive Patient Education [employer].

## 2015-03-24 NOTE — Progress Notes (Signed)
   Subjective:    Patient ID: Belinda [name], female    DOB: 10/26/1980, 34 y.o.   MRN: [id number]  HPIpt arrives today for a med check. Anxiety. Pt wants referral to specialist bc she feels like xanax needs to be increased. Takes 47m one qid.   Wants to discuss weight. Taking phentermine. Pt states she doesn't eat much during the day. Doesn't always take phentermine every day.   Needs refill on albuterol inhaler and omeprazole. Patient states that she gets a lot of heartburn issues. She describes it more as a burning sensation radiates up in his chest. Denies any chest tightness or pain Patient also relates her anxiety seems to be getting worse she does not want take any antidepressants for she's afraid all of mom well make her"fat"  Pt wants rx for dysol and to get prior auth to get insurance to pay for. This patient has hyperhidrosis issues with excessive sweating in her palms of her hands she has tried over-the-counter measures without success  Review of Systems  Constitutional: Negative for fever and fatigue.  HENT: Negative for congestion.   Respiratory: Negative for cough and shortness of breath.   Cardiovascular: Negative for chest pain.  Gastrointestinal: Negative for abdominal pain.  Musculoskeletal: Negative for back pain and arthralgias.       Objective:   Physical Exam  Constitutional: She appears well-nourished. No distress.  Cardiovascular: Normal rate, regular rhythm and normal heart sounds.   No murmur heard. Pulmonary/Chest: Effort normal and breath sounds normal. No respiratory distress.  Musculoskeletal: She exhibits no edema.  Lymphadenopathy:    She has no cervical adenopathy.  Neurological: She is alert. She exhibits normal muscle tone.  Psychiatric: Her behavior is normal.  Vitals reviewed.   25 minutes was spent with the patient. Greater than half the time was spent in discussion and answering questions and counseling regarding the issues that the  patient came in for today. Greater than half the time today was spent discussing with this patient multiple issues including her anxiety, hyperhidrosis, questions regarding reflux, questions regarding obesity, and questions regarding her attention issues.      Assessment & Plan:  1. Generalized anxiety disorder Severe anxiety I do not recommend going up on her Xanax dose referral to psychiatry recommended. Patient is requesting female counselor I told her this could that could not be guaranteed. Once she is under the care of psychiatry they would manage her Xanax  2. Hyperhydrosis disorder We will try to get Drysol approved, once again no guarantees Medicaid is very strict  3. Gastroesophageal reflux disease without esophagitis Omeprazole every single day minimize tomato based products caffeine's chocolates  4. Morbid obesity, unspecified obesity type ([place]na Patient encouraged exercise try to lose weight there is risk of metabolic syndrome await testing - Hemoglobin A1c - Insulin, fasting  5. Hyperglycemia Patient at risk for diabetes. Await testing watch diet try to lose weight at Adipex  1 month given - Hemoglobin A1c - Insulin, fasting  6. Hypertriglyceridemia Low fat low starch diet regular physical activity - Lipid panel  7. High risk medication use Check kidney functions sodium potassium - Basic metabolic panel 25 minutes was spent with the patient. Greater than half the time was spent in discussion and answering questions and counseling regarding the issues that the patient came in for today.

## 2015-03-25 ENCOUNTER — Telehealth: Payer: Self-pay | Admitting: Family Medicine

## 2015-03-25 NOTE — Telephone Encounter (Signed)
Pt's aluminum chloride (DRYSOL) 20 % external solution is NOT covered by Medicaid, there is no prior auth process for this medication due to there are no formulary alternatives to try and fail See red folder  Patient may pay out of pocket if she desires

## 2015-03-25 NOTE — Telephone Encounter (Signed)
[facility]

## 2015-03-25 NOTE — Telephone Encounter (Signed)
Please let the patient know that this is not covered in Medicaid offers absolutely no chance to have it covered. She may pay for it out of pocket if she wants. There is nothing we can do to change this

## 2015-03-27 NOTE — Telephone Encounter (Signed)
Discussed with patient. Patient advised that this is not covered in [place] offers absolutely no chance to have it covered. She may pay for it out of pocket if she wants. There is nothing we can do to change this. Patient verbalized understanding.

## 2015-03-28 ENCOUNTER — Encounter: Payer: Self-pay | Admitting: Family Medicine

## 2015-04-20 ENCOUNTER — Other Ambulatory Visit: Payer: Self-pay | Admitting: Family Medicine

## 2015-05-24 ENCOUNTER — Other Ambulatory Visit: Payer: Self-pay | Admitting: Family Medicine

## 2015-05-26 NOTE — Telephone Encounter (Signed)
Nurse's-my understanding that this medication is not covered by Medicaid. We will not be able to get prior authorization if necessary. Certainly if it is covered it is fine to refill 4

## 2015-06-24 ENCOUNTER — Telehealth: Payer: Self-pay | Admitting: Family Medicine

## 2015-06-24 MED ORDER — ALPRAZOLAM 1 MG PO TABS: 1.0000 mg | ORAL_TABLET | Freq: Four times a day (QID) | ORAL | Status: DC | PRN

## 2015-06-24 NOTE — Telephone Encounter (Signed)
She may have one refill on her Xanax in to be seen toward the end of the month

## 2015-06-24 NOTE — Telephone Encounter (Signed)
Spoke with patient and informed her per Dr.Scott Luking- May have one refill on her Xanax. Needs to be seen toward the end of the month. Patient verbalized understanding and was transferred to front schedule an appointment.

## 2015-06-24 NOTE — Telephone Encounter (Signed)
Patient calling for refill on xanax and tried to explain she needs her 3 th month follow up to get refills on which she missed in April by not scheduling her appointment when she checked out.             i explained had to ask doctor before I can schedule appointment and the next available for med check afternoon was on 5/26 and she stated cant wait that long. So need to know when we can schedule appointment.Marland Kitchen

## 2015-07-18 ENCOUNTER — Ambulatory Visit (INDEPENDENT_AMBULATORY_CARE_PROVIDER_SITE_OTHER): Payer: Medicaid Other | Admitting: Family Medicine

## 2015-07-18 ENCOUNTER — Encounter: Payer: Self-pay | Admitting: Family Medicine

## 2015-07-18 VITALS — BP 136/86 | Ht 61.0 in | Wt 216.2 lb

## 2015-07-18 DIAGNOSIS — F411 Generalized anxiety disorder: Secondary | ICD-10-CM | POA: Diagnosis not present

## 2015-07-18 DIAGNOSIS — F320 Major depressive disorder, single episode, mild: Secondary | ICD-10-CM

## 2015-07-18 DIAGNOSIS — F329 Major depressive disorder, single episode, unspecified: Secondary | ICD-10-CM | POA: Insufficient documentation

## 2015-07-18 MED ORDER — ALPRAZOLAM 1 MG PO TABS
1.0000 mg | ORAL_TABLET | Freq: Four times a day (QID) | ORAL | Status: DC | PRN
Start: 2015-07-18 — End: 2015-12-02

## 2015-07-18 MED ORDER — PHENTERMINE HCL 37.5 MG PO TABS: 37.5000 mg | ORAL_TABLET | Freq: Every day | ORAL | Status: DC

## 2015-07-18 MED ORDER — ALPRAZOLAM 1 MG PO TABS: 1.0000 mg | ORAL_TABLET | Freq: Four times a day (QID) | ORAL | Status: DC | PRN

## 2015-07-18 MED ORDER — BUPROPION HCL ER (SR) 150 MG PO TB12: 150.0000 mg | ORAL_TABLET | Freq: Two times a day (BID) | ORAL | Status: DC

## 2015-07-18 NOTE — Progress Notes (Signed)
   Subjective:    Patient ID: Belinda [name], female    DOB: April 13, 1980, 35 y.o.   MRN: [id number]  Anxiety Presents for follow-up visit.   Compliance with medications is 76-100%.   Patient states no other concerns this visit. Patient denies any chest tightness pressure pain shortness breath she does relate how she feels stressed and run down at times anxious sometimes sad she is willing to restart her antidepressant Patient having a fair amount of sadness crying spells not suicidal Review of Systems    patient not suicidal Objective:   Physical Exam  Lungs are clear hearts regular pulse normal BP on recheck 134/86  Restart antidepressant    Assessment & Plan:  Anxiety related issues prescription given with refills follow-up in approximately 4 months  Weight related issues  Adipex for no more than 2 months  Healthy eating regular physical activity weight loss recommended  Recheck patient in 6 weeks because of increased stress she is had with the sickness of her ex-husband

## 2015-08-22 ENCOUNTER — Telehealth: Payer: Self-pay | Admitting: Family Medicine

## 2015-08-22 MED ORDER — OMEPRAZOLE 20 MG PO CPDR: DELAYED_RELEASE_CAPSULE | ORAL | Status: DC

## 2015-08-22 NOTE — Telephone Encounter (Signed)
Spoke with patient and informed her per Dr.Scott Luking- Increase her medicine to 20 mg twice a day. #60, 4 refills Please let pharmacist noted that if this is not covered at that dosing then try Protonix 40 1 daily, #30, 4 refills. Nurse's please call the patient let her know that we are doing the best we can if prior authorization is necessary this will not be handled until Monday and there is absolutely nothing I can do about it over the phone on Friday Saturday or Sunday. May use Maalox 2 tablespoons 4 times a day when necessary discomfort-follow-up if ongoing troubles for office visit. Patient verbalized understanding. Medication sent into pharmacy.

## 2015-08-22 NOTE — Telephone Encounter (Signed)
Please call the pharmacy. Increase her medicine to 20 mg twice a day. #60, 4 refills Please let pharmacist noted that if this is not covered at that dosing then try Protonix 40 1 daily, #30, 4 refills. Nurse's please call the patient let her know that we are doing the best we can if prior authorization is necessary this will not be handled until Monday and there is absolutely nothing I can do about it over the phone on Friday Saturday or Sunday. May use Maalox 2 tablespoons 4 times a day when necessary discomfort-follow-up if ongoing troubles for office visit

## 2015-08-22 NOTE — Telephone Encounter (Signed)
Stomach ulcer is bad, very stressed out  Taking her Prilosec 56m is not helping much   Pharmacist recommended increasing dose to 490mdaily   Can we increase dose?  If so can we send in new Rx with refills   Please advise & call pt when done     [facility]

## 2015-09-01 ENCOUNTER — Encounter: Payer: Self-pay | Admitting: Family Medicine

## 2015-09-01 ENCOUNTER — Ambulatory Visit (INDEPENDENT_AMBULATORY_CARE_PROVIDER_SITE_OTHER): Payer: Medicaid Other | Admitting: Family Medicine

## 2015-09-01 VITALS — BP 108/76 | Ht 61.0 in | Wt 208.8 lb

## 2015-09-01 DIAGNOSIS — F411 Generalized anxiety disorder: Secondary | ICD-10-CM | POA: Diagnosis not present

## 2015-09-01 DIAGNOSIS — F429 Obsessive-compulsive disorder, unspecified: Secondary | ICD-10-CM

## 2015-09-01 MED ORDER — CITALOPRAM HYDROBROMIDE 10 MG PO TABS: 10.0000 mg | ORAL_TABLET | Freq: Every day | ORAL | Status: DC

## 2015-09-01 NOTE — Progress Notes (Signed)
   Subjective:    Patient ID: Belinda [name], female    DOB: Jun 05, 1980, 35 y.o.   MRN: [id number]  HPI  Patient arrives for a follow up on blood pressure. Patient states she was unable to take wellbutrin. Wellbutrin made her anxious. She states that she would like to try different medicine but she is hesitant at the same time she does recognize how Xanax alone does not always helping her symptoms. She does not want to go through any counseling or see psychiatry. She denies being depressed currently Review of Systems Denies any chest tightness pressure pain shortness of breath    Objective:   Physical Exam  Lungs are clear hearts regular blood pressure good  She states the home situation she was stressed about is now better    Assessment & Plan:  Blood pressure recheck good Significant anxiety issues with OCD since-patient resistant to trying any antidepressants she is tried several in the past did not help I convinced her to try Celexa 10 mg 1 daily she is worried about weight gain with medicines.  She states that she would be willing to try Effexor if this doesn't work  Follow-up in 2 months No adipex today

## 2015-09-27 ENCOUNTER — Other Ambulatory Visit: Payer: Self-pay | Admitting: Family Medicine

## 2015-10-20 ENCOUNTER — Telehealth: Payer: Self-pay | Admitting: Family Medicine

## 2015-10-20 MED ORDER — KETOCONAZOLE 2 % EX CREA: TOPICAL_CREAM | CUTANEOUS | 0 refills | Status: DC

## 2015-10-20 NOTE — Telephone Encounter (Signed)
Patient was wanting a letter for her son about his asthma. Informed mother that Dr Nicki Reaper stated he can not do letter for her son who is no longer his patient and has not been seen here in years. Mother verbalized understanding. Patient would like a cream sent in for a raw like yeast rash in the crease of her leg at [facility]

## 2015-10-20 NOTE — Telephone Encounter (Signed)
Prescription sent electronically to pharmacy. Patient notified. 

## 2015-10-20 NOTE — Telephone Encounter (Signed)
Pt called stating that she wants to speak to Dr Nicki Reaper only about a personal issue she is having with herself. Pt originally called wanting [place] to write a letter stating that we diagnosed her son with asthma. Her son is not our patient and in epic is listed under [facility]. I informed her that he would have to be seen by [place] to get this letter. After informing the pt of this she requested a call from Dr. Nicki Reaper on a personal issue regarding herself. Please advise.

## 2015-10-20 NOTE — Telephone Encounter (Signed)
Nizoral cream apply 3 times a day when necessary for the next 7-14 days as directed

## 2015-10-28 ENCOUNTER — Telehealth: Payer: Self-pay | Admitting: Family Medicine

## 2015-10-28 ENCOUNTER — Other Ambulatory Visit: Payer: Self-pay | Admitting: *Deleted

## 2015-10-28 MED ORDER — KETOCONAZOLE 2 % EX CREA: TOPICAL_CREAM | CUTANEOUS | 4 refills | Status: DC

## 2015-10-28 NOTE — Telephone Encounter (Signed)
Left message for pt that the med she requested has been sent to pharm.

## 2015-10-28 NOTE — Telephone Encounter (Signed)
Wants refill on cream for yeast. Has yeast in the bends of her legs. [facility] [place]

## 2015-10-28 NOTE — Telephone Encounter (Signed)
Nizoral cream applied twice a day when necessary, 30 g tube, 4 refills

## 2015-10-28 NOTE — Telephone Encounter (Signed)
Patient is requesting a cream for yeast infection in the bins of her legs.She states you gave her something before that really helped but couldn't remember what is was.[place] [facility].

## 2015-10-31 ENCOUNTER — Ambulatory Visit: Payer: Medicaid Other | Admitting: Family Medicine

## 2015-10-31 ENCOUNTER — Telehealth: Payer: Self-pay | Admitting: Family Medicine

## 2015-10-31 MED ORDER — ALBUTEROL SULFATE HFA 108 (90 BASE) MCG/ACT IN AERS: 2.0000 | INHALATION_SPRAY | Freq: Four times a day (QID) | RESPIRATORY_TRACT | 6 refills | Status: DC | PRN

## 2015-10-31 MED ORDER — ALUMINUM CHLORIDE 20 % EX SOLN: Freq: Every day | CUTANEOUS | 6 refills | Status: DC

## 2015-10-31 MED ORDER — PENICILLIN V POTASSIUM 500 MG PO TABS: 500.0000 mg | ORAL_TABLET | Freq: Four times a day (QID) | ORAL | 0 refills | Status: DC

## 2015-10-31 NOTE — Telephone Encounter (Signed)
Patient says that her red and white inhaler (she doesn't know the name) is too weak for her and she said that the Ventolin works perfectly for her.  She wants to know if we can send in the Ventolin inhaler for her because it works better for her.  [facility]

## 2015-10-31 NOTE — Telephone Encounter (Signed)
Penicillin VK 500 mg 1 4 times a day for 7 days, she may have the other prescription-unfortunately I do not control what Medicaid does or does not cover thank you

## 2015-10-31 NOTE — Addendum Note (Signed)
Addended by: Jesusita Oka on: 10/31/2015 02:13 PM   Modules accepted: Orders

## 2015-10-31 NOTE — Telephone Encounter (Signed)
Patient has a broken tooth and would like an antibiotic called.  Also requesting Rx for aluminum chloride (DRYSOL) 20 % external solution.  [facility]

## 2015-10-31 NOTE — Telephone Encounter (Signed)
The patient is at the mercy of what Medicaid covers. Pro-air which is what I believe she uses may be what Medicaid covers certainly can send in a prescription for Ventolin if it is covered by Medicaid then that salts the problem if it is not there is not much I can do because pro-air and Ventolin are considered to be equal because they are both albuterol

## 2015-10-31 NOTE — Telephone Encounter (Signed)
Ventolin sent to pharmacy. Tried to call no answer.

## 2015-10-31 NOTE — Telephone Encounter (Signed)
Meds sent to pharmacy. Tried to call no answer.

## 2015-11-03 ENCOUNTER — Encounter: Payer: Self-pay | Admitting: Family Medicine

## 2015-11-13 ENCOUNTER — Telehealth: Payer: Self-pay | Admitting: Family Medicine

## 2015-11-13 NOTE — Telephone Encounter (Signed)
Pt is requesting a refill on her ALPRAZolam (XANAX) 1 MG tablet    [facility]

## 2015-11-13 NOTE — Telephone Encounter (Signed)
May have one refill. This may be filled on 11/16/2015, Xanax 1 mg, #120, one 4 times a day when necessary-keep follow-up visit

## 2015-11-14 NOTE — Telephone Encounter (Signed)
Tried to call voice mail box not set up yet.

## 2015-11-17 NOTE — Telephone Encounter (Signed)
Patient notified and stated she has follow up office visit scheduled tomorrow.

## 2015-11-18 ENCOUNTER — Ambulatory Visit: Payer: Medicaid Other | Admitting: Family Medicine

## 2015-11-26 ENCOUNTER — Ambulatory Visit: Payer: Medicaid Other | Admitting: Nurse Practitioner

## 2015-12-01 ENCOUNTER — Telehealth: Payer: Self-pay | Admitting: Family Medicine

## 2015-12-01 NOTE — Telephone Encounter (Signed)
Pt called stating that she missed her appt due to her friends son passing. Pt has an appt rescheduled for tomorrow at 1:30. Pt is wanting someone to call her today to let her know if she is going to be discharged.

## 2015-12-01 NOTE — Telephone Encounter (Signed)
Pt called again to explain the reason for her no show last week. She states that she wants to make sure her doctors knows she is sorry and would like to know if she is being discharged from the practice. She was advised that messages are answered within 24 hrs and it is advised that she keep her appointment for tomorrow.

## 2015-12-02 ENCOUNTER — Ambulatory Visit (INDEPENDENT_AMBULATORY_CARE_PROVIDER_SITE_OTHER): Payer: Medicaid Other | Admitting: Family Medicine

## 2015-12-02 ENCOUNTER — Encounter: Payer: Self-pay | Admitting: Family Medicine

## 2015-12-02 VITALS — BP 118/82 | Temp 98.5°F | Ht 61.0 in

## 2015-12-02 DIAGNOSIS — F4321 Adjustment disorder with depressed mood: Secondary | ICD-10-CM

## 2015-12-02 DIAGNOSIS — F411 Generalized anxiety disorder: Secondary | ICD-10-CM | POA: Diagnosis not present

## 2015-12-02 DIAGNOSIS — N912 Amenorrhea, unspecified: Secondary | ICD-10-CM

## 2015-12-02 DIAGNOSIS — F4320 Adjustment disorder, unspecified: Secondary | ICD-10-CM

## 2015-12-02 DIAGNOSIS — L409 Psoriasis, unspecified: Secondary | ICD-10-CM | POA: Diagnosis not present

## 2015-12-02 LAB — POCT URINE PREGNANCY: Preg Test, Ur: NEGATIVE

## 2015-12-02 MED ORDER — OLOPATADINE HCL 0.2 % OP SOLN: OPHTHALMIC | 3 refills | Status: DC

## 2015-12-02 MED ORDER — ALPRAZOLAM 1 MG PO TABS: 1.0000 mg | ORAL_TABLET | Freq: Four times a day (QID) | ORAL | 4 refills | Status: DC | PRN

## 2015-12-02 MED ORDER — CLOBETASOL PROPIONATE 0.05 % EX SOLN: CUTANEOUS | 12 refills | Status: DC

## 2015-12-02 NOTE — Progress Notes (Signed)
   Subjective:    Patient ID: Belinda [name], female    DOB: 1980-09-06, 35 y.o.   MRN: [id number]  HPIAnxiety. Med check up. Needs refill on xanax. Pt under a lot of stress. This patient lost a close family friend. Family friend's son died of a cancer. This happened approximately a week ago. Patient states because of this she missed her appointment. She is very tearful today distraught about it very upset.  Ear popping, eye red. Intermittent eyes and redness in irritation. Some itching.  Pt had a one day period last month. Wants to do pregnancy test. Stopped taking birth control  Rash on legs. Using clobetasol cream. Patient has severe psoriasis on her legs and arms she uses clobetasol that seems to help for the most part. Patient denies any trying a methotrexate rather measures in the past she is open to seeing dermatology.  She is under fair amount of stress related to the lost of her friend's son but she states that she is not depressed she is just very stressed and grieved  Rash on arms.   Review of Systems  Constitutional: Negative for activity change, fatigue and fever.  Respiratory: Negative for cough and shortness of breath.   Cardiovascular: Negative for chest pain and leg swelling.  Neurological: Negative for headaches.       Objective:   Physical Exam  Constitutional: She appears well-developed and well-nourished.  HENT:  Head: Normocephalic.  Cardiovascular: Normal rate, regular rhythm and normal heart sounds.   No murmur heard. Pulmonary/Chest: Effort normal and breath sounds normal.  Neurological: She is alert.  Skin: Skin is warm and dry.  Psychiatric: She has a normal mood and affect.  Vitals reviewed.  25 minutes spent with patient.       Assessment & Plan:  Stress reaction referral for counseling I believe the patient would benefit from ongoing counseling  Psoriasis continue clobetasol referral to dermatology for further evaluation and possible oral  treatments  Chronic anxiety she will use Xanax new prescription given follow-up in a proximally 4 months  Noncompliance missed appointments long discussion held with the patient she was very distraught about this. I relayed to her that typically we would dismiss a patient who is had no shows such as this but we will reconsider this time only. If she has further no shows we will have no other choice but to dismiss her. She relates in the future she will do her best to keep her appointment

## 2015-12-03 ENCOUNTER — Telehealth: Payer: Self-pay | Admitting: Family Medicine

## 2015-12-03 ENCOUNTER — Encounter: Payer: Self-pay | Admitting: Family Medicine

## 2015-12-03 MED ORDER — FLUCONAZOLE 150 MG PO TABS: 150.0000 mg | ORAL_TABLET | Freq: Every day | ORAL | 0 refills | Status: DC

## 2015-12-03 MED ORDER — CLINDAMYCIN HCL 300 MG PO CAPS: ORAL_CAPSULE | ORAL | 0 refills | Status: DC

## 2015-12-03 NOTE — Telephone Encounter (Signed)
Med sent to pharmacy. Patient was notified.  

## 2015-12-03 NOTE — Telephone Encounter (Signed)
Patient was prescribed penicillin for an infected tooth the other week by Dr. Nicki Reaper.  She said she has never taken Penicillin before and she thinks she could possibly be allergic to this and doesn't want to risk taking it.  Her fiance was prescribed clindamycin and she said it was working for her and wants to know if we can try her on this medication.  Also, she said she has a yeast infection and would like Diflucan called in for this.  [facility]

## 2015-12-03 NOTE — Telephone Encounter (Signed)
The logic is faulty. She could be allergic to clindamycin as well. Typically penicillin works best but in this situation because of the patient's request clindamycin 300 mg 1 3 times a day for 7 days she needs to see a dentist who will manage this ongoing

## 2015-12-04 NOTE — Telephone Encounter (Signed)
See ov note and letter

## 2015-12-05 ENCOUNTER — Encounter: Payer: Self-pay | Admitting: Family Medicine

## 2015-12-09 ENCOUNTER — Encounter: Payer: Self-pay | Admitting: Family Medicine

## 2015-12-10 ENCOUNTER — Telehealth: Payer: Self-pay | Admitting: Family Medicine

## 2015-12-10 NOTE — Telephone Encounter (Signed)
FYI - pt is scheduled to see Belinda [name] for 12/16/15 @ 12:00 Asked [name] to let me know if pt is a "no show"

## 2015-12-19 ENCOUNTER — Telehealth: Payer: Self-pay | Admitting: Family Medicine

## 2015-12-19 MED ORDER — AMOXICILLIN 500 MG PO CAPS: 500.0000 mg | ORAL_CAPSULE | Freq: Three times a day (TID) | ORAL | 0 refills | Status: DC

## 2015-12-19 NOTE — Telephone Encounter (Signed)
Amoxicillin 500 mg 1 3 times a day for 7 days, see dentistp

## 2015-12-19 NOTE — Telephone Encounter (Signed)
Patients tooth cracked again and its extremely painful.  She wants to know if we can send Rx for Clindamycin or Amoxicillan again.   [facility]

## 2015-12-19 NOTE — Telephone Encounter (Signed)
Left message on voicemail notifying patient med sent to pharmacy.

## 2016-01-30 ENCOUNTER — Other Ambulatory Visit: Payer: Self-pay | Admitting: *Deleted

## 2016-01-30 ENCOUNTER — Telehealth: Payer: Self-pay | Admitting: Family Medicine

## 2016-01-30 MED ORDER — CLINDAMYCIN HCL 300 MG PO CAPS: 300.0000 mg | ORAL_CAPSULE | Freq: Three times a day (TID) | ORAL | 0 refills | Status: DC

## 2016-01-30 NOTE — Telephone Encounter (Signed)
rx sent to pharm with a note for pharm to notify pt when script is ready since pt has not returned call to [place].

## 2016-01-30 NOTE — Telephone Encounter (Signed)
Tried to call no answer 01/30/16

## 2016-01-30 NOTE — Telephone Encounter (Signed)
She may have clindamycin 300 mg 1 3 times a day for 7 days, ibuprofen for pain OTC

## 2016-01-30 NOTE — Telephone Encounter (Signed)
Patient is still dealing with her chipped tooth.  She has not had a chance to go to the dentist, but she says that last time we prescribed her amoxicillin.  She says that the pharmacy told her that clindamycin would help her tooth probably more than the amoxicillin, so she would like clindamycin called in.  She said she is experiencing throbbing pain with her cracked tooth.  [facility]

## 2016-02-08 ENCOUNTER — Other Ambulatory Visit: Payer: Self-pay | Admitting: Family Medicine

## 2016-02-09 NOTE — Telephone Encounter (Signed)
May have one refill

## 2016-03-24 ENCOUNTER — Other Ambulatory Visit: Payer: Self-pay | Admitting: Family Medicine

## 2016-03-25 ENCOUNTER — Telehealth: Payer: Self-pay | Admitting: Family Medicine

## 2016-03-25 MED ORDER — CLINDAMYCIN HCL 300 MG PO CAPS: 300.0000 mg | ORAL_CAPSULE | Freq: Three times a day (TID) | ORAL | 0 refills | Status: DC

## 2016-03-25 NOTE — Telephone Encounter (Signed)
Clindamycin 300,1 tid 7 days

## 2016-03-25 NOTE — Telephone Encounter (Signed)
Prescription sent electronically to pharmacy. Patient notified. 

## 2016-03-25 NOTE — Telephone Encounter (Signed)
Patient requesting Rx for antibiotic for tooth abscess.  Dr. Nicki Reaper has called this in for her before.  She said she will soon be able to get her tooth surgery.  [facility]

## 2016-04-15 ENCOUNTER — Encounter: Payer: Self-pay | Admitting: Family Medicine

## 2016-04-15 ENCOUNTER — Ambulatory Visit (INDEPENDENT_AMBULATORY_CARE_PROVIDER_SITE_OTHER): Payer: Medicaid Other | Admitting: Family Medicine

## 2016-04-15 VITALS — BP 130/90 | Temp 98.3°F | Ht 61.0 in | Wt 219.0 lb

## 2016-04-15 DIAGNOSIS — B349 Viral infection, unspecified: Secondary | ICD-10-CM | POA: Diagnosis not present

## 2016-04-15 DIAGNOSIS — B9689 Other specified bacterial agents as the cause of diseases classified elsewhere: Secondary | ICD-10-CM | POA: Diagnosis not present

## 2016-04-15 DIAGNOSIS — J019 Acute sinusitis, unspecified: Secondary | ICD-10-CM

## 2016-04-15 MED ORDER — CEFPROZIL 500 MG PO TABS: 500.0000 mg | ORAL_TABLET | Freq: Two times a day (BID) | ORAL | 0 refills | Status: DC

## 2016-04-15 MED ORDER — ALPRAZOLAM 1 MG PO TABS: 1.0000 mg | ORAL_TABLET | Freq: Four times a day (QID) | ORAL | 1 refills | Status: DC | PRN

## 2016-04-15 NOTE — Progress Notes (Signed)
   Subjective:    Patient ID: Belinda [name], female    DOB: [birth year], 36 y.o.   MRN: [id number]  Sinusitis  This is a new problem. The current episode started in the past 7 days. Associated symptoms include headaches and sinus pressure. (Dizziness, Runny nose, Diarrhea, ear pain, vomiting)   Patient past week head congestion drainage coughing sinus pressure she also has poor teeth that are needing dental repair soon   Review of Systems  HENT: Positive for sinus pressure.   Neurological: Positive for headaches.   Denies high fever vomiting diarrhea    Objective:   Physical Exam Mild sinus tenderness throat normal neck supple lungs clear heart regular   The patient was seen after hours to prevent an emergency department visit     Assessment & Plan:  Viral URI Secondary rhinosinusitis Antibiotic prescribed warning signs discussed Refill on her Xanax was given patient will need follow-up office visit within 60 days

## 2016-04-19 ENCOUNTER — Other Ambulatory Visit: Payer: Self-pay | Admitting: Family Medicine

## 2016-05-31 ENCOUNTER — Other Ambulatory Visit: Payer: Self-pay | Admitting: Family Medicine

## 2016-06-24 ENCOUNTER — Encounter: Payer: Self-pay | Admitting: Family Medicine

## 2016-06-24 ENCOUNTER — Other Ambulatory Visit: Payer: Self-pay | Admitting: *Deleted

## 2016-06-24 ENCOUNTER — Ambulatory Visit (INDEPENDENT_AMBULATORY_CARE_PROVIDER_SITE_OTHER): Payer: Medicaid Other | Admitting: Family Medicine

## 2016-06-24 VITALS — BP 128/84 | Ht 61.0 in | Wt 225.0 lb

## 2016-06-24 DIAGNOSIS — J301 Allergic rhinitis due to pollen: Secondary | ICD-10-CM | POA: Diagnosis not present

## 2016-06-24 DIAGNOSIS — F411 Generalized anxiety disorder: Secondary | ICD-10-CM

## 2016-06-24 MED ORDER — ALPRAZOLAM 1 MG PO TABS: 1.0000 mg | ORAL_TABLET | Freq: Four times a day (QID) | ORAL | 4 refills | Status: DC | PRN

## 2016-06-24 MED ORDER — OLOPATADINE HCL 0.2 % OP SOLN: OPHTHALMIC | 3 refills | Status: DC

## 2016-06-24 MED ORDER — PHENTERMINE HCL 37.5 MG PO CAPS: 37.5000 mg | ORAL_CAPSULE | ORAL | 1 refills | Status: DC

## 2016-06-24 MED ORDER — PENICILLIN V POTASSIUM 500 MG PO TABS: 500.0000 mg | ORAL_TABLET | Freq: Four times a day (QID) | ORAL | 0 refills | Status: DC

## 2016-06-24 NOTE — Progress Notes (Signed)
   Subjective:    Patient ID: Belinda [name], female    DOB: 1980/06/16, 36 y.o.   MRN: [id number]  HPIMed check up.   Requesting refill on olopatadine eye drops and cefzil. Takes cefzil for dental issue. She states she know she needs a go the dentist to get a tooth pulled  Her anxiety issues are stable at times her overwhelming she has tried numerous antidepressants without help she is tried seen psychiatry in the past without help she is open to seeing a Social worker. She uses Xanax 4 times a day and is done so for a good 15-20 years  Wants to get rx for phentermine.  Patient is very upset about her weight being so heavy she is trying to watch her diet try to exercise in the past she's been able to uses medication for shortness and a time and it does help her lose weight  Review of Systems  Constitutional: Negative for activity change and appetite change.  Gastrointestinal: Negative for abdominal pain and vomiting.  Neurological: Negative for weakness.  Psychiatric/Behavioral: Negative for confusion.       Objective:   Physical Exam  Constitutional: She appears well-nourished. No distress.  HENT:  Head: Normocephalic.  Cardiovascular: Normal rate, regular rhythm and normal heart sounds.   No murmur heard. Pulmonary/Chest: Effort normal and breath sounds normal.  Musculoskeletal: She exhibits no edema.  Lymphadenopathy:    She has no cervical adenopathy.  Neurological: She is alert.  Psychiatric: Her behavior is normal.  Vitals reviewed.         Assessment & Plan:  Dental abscess-Penicillin as directed see dentist get to hold  Chronic anxiety lifelong worse referral for counseling. Patient does not tolerate any other antidepressants we have tried. Patient uses Xanax 1 mg 4 times daily. Patient was told that is the highest dose that we will prescribed.  Patient does have a history of drug abuse denies any abuse currently  Significant obesity patient dwells on this issue  and gets very anxious about it, we will allow for phentermine for 2 months not for long-term use  25 minutes was spent with the patient. Greater than half the time was spent in discussion and answering questions and counseling regarding the issues that the patient came in for today. Recheck patient in 4 months-patient typically will schedule appointment when she gets toward the end of her Xanax prescription

## 2016-06-25 ENCOUNTER — Other Ambulatory Visit: Payer: Self-pay | Admitting: Family Medicine

## 2016-06-28 ENCOUNTER — Encounter: Payer: Self-pay | Admitting: Family Medicine

## 2016-07-06 ENCOUNTER — Other Ambulatory Visit: Payer: Self-pay | Admitting: Family Medicine

## 2016-08-30 ENCOUNTER — Other Ambulatory Visit: Payer: Self-pay | Admitting: Family Medicine

## 2016-10-10 ENCOUNTER — Other Ambulatory Visit: Payer: Self-pay | Admitting: Family Medicine

## 2016-11-09 ENCOUNTER — Other Ambulatory Visit: Payer: Self-pay | Admitting: Family Medicine

## 2016-11-10 NOTE — Telephone Encounter (Signed)
1 refill, needs follow-up office

## 2016-11-16 ENCOUNTER — Ambulatory Visit (INDEPENDENT_AMBULATORY_CARE_PROVIDER_SITE_OTHER): Payer: Medicaid Other | Admitting: Family Medicine

## 2016-11-16 ENCOUNTER — Encounter: Payer: Self-pay | Admitting: Family Medicine

## 2016-11-16 VITALS — BP 130/80 | Ht 61.0 in

## 2016-11-16 DIAGNOSIS — M545 Low back pain, unspecified: Secondary | ICD-10-CM

## 2016-11-16 DIAGNOSIS — F411 Generalized anxiety disorder: Secondary | ICD-10-CM

## 2016-11-16 MED ORDER — PHENTERMINE HCL 37.5 MG PO CAPS: 37.5000 mg | ORAL_CAPSULE | ORAL | 1 refills | Status: DC

## 2016-11-16 MED ORDER — ALPRAZOLAM 1 MG PO TABS: 1.0000 mg | ORAL_TABLET | Freq: Four times a day (QID) | ORAL | 0 refills | Status: DC | PRN

## 2016-11-16 MED ORDER — ALPRAZOLAM 1 MG PO TABS: 1.0000 mg | ORAL_TABLET | Freq: Four times a day (QID) | ORAL | 2 refills | Status: DC | PRN

## 2016-11-16 MED ORDER — DICLOFENAC SODIUM 75 MG PO TBEC: 75.0000 mg | DELAYED_RELEASE_TABLET | Freq: Two times a day (BID) | ORAL | 0 refills | Status: DC

## 2016-11-16 NOTE — Progress Notes (Addendum)
   Subjective:    Patient ID: Belinda [name], female    DOB: [birth year], 36 y.o.   MRN: [id number]  Anxiety  Presents for follow-up visit. Symptoms include nervous/anxious behavior.    Patient also has concerns of leg and back pain. Started 1 month ago-gradually worse-wakes her at [employer] and tylenol- for some reason AZO works No triggger Nothing seems to help Been walking a lot Patient relates back pain and discomfort for about a month nothing seems to help at certain movements can sometimes trigger other times it hurts for no reason at all she states she had lumbar epidural when she was pregnant  She suffers with anxiety and depression she takes her Xanax she is tried numerous antidepressants to help her anxiety and that helped she denies any severe depression currently she does not want to be on antidepressant  Concerned about urine-she thinks that she is having urinary tract infection but there is no dysuria or pyuria or hematuria no fever chills in her back discomfort sounds more like sciatica   Review of Systems  Psychiatric/Behavioral: The patient is nervous/anxious.   Denies chest tightness pressure pain shortness breath high fever chills sweats and vomiting diarrhea headaches joint pains she does relate back pain and left leg discomfort It should be noted that on further requests from the patient in discussion of her mental health questionnaire that a couple items were erroneously put in by myself her overall score for depression was a 7 in patient states that her problem is anxiousness. She does not feel that she is depressed she just finds herself very anxious she is tried numerous antidepressants without success    Objective:   Physical Exam Neck no masses lungs are clear no crackles respiratory rate is normal heart is regular there are no murmurs pulses are normal skin warm dry extremities no edema low back mild tenderness negative straight leg raise on the right  slight discomfort with straight leg raise on the left   25 minutes was spent with the patient. Greater than half the time was spent in discussion and answering questions and counseling regarding the issues that the patient came in for today.     Assessment & Plan:  Generalized anxiety disorder Xanax with 2 refills given Follow-up in approximately 3 months Patient does have some mild depression symptoms Significant low back pain with sciatica I believe the patient would benefit from anti-inflammatories physical exercise in a physical therapy consultation I do not feel she needs an MRI Morbid obesity prescription for phentermine given with refill follow-up if ongoing troubles watch diet stay physically active cut back on portions try to lose weight

## 2016-11-16 NOTE — Patient Instructions (Signed)

## 2016-11-17 ENCOUNTER — Telehealth: Payer: Self-pay | Admitting: Family Medicine

## 2016-11-17 MED ORDER — NAPROXEN 500 MG PO TABS: 500.0000 mg | ORAL_TABLET | Freq: Two times a day (BID) | ORAL | 0 refills | Status: DC

## 2016-11-17 NOTE — Telephone Encounter (Signed)
Prescription sent electronically to pharmacy. 

## 2016-11-17 NOTE — Telephone Encounter (Signed)
Received fax from patient's pharmacy stating that patient's Diclofenac is non preferred. Per medicaid list preferred medications are indomethacin capsules, ketorolac tablets, meloxicam, naproxen tablets. May we change to a preferred. Please advise?

## 2016-11-17 NOTE — Telephone Encounter (Signed)
Naproxen 500, 1 bid prn, #30

## 2016-11-17 NOTE — Addendum Note (Signed)
Addended by: Dairl Ponder on: 11/17/2016 02:59 PM   Modules accepted: Orders

## 2016-11-18 ENCOUNTER — Other Ambulatory Visit: Payer: Self-pay

## 2016-11-18 LAB — URINE CULTURE

## 2016-11-18 MED ORDER — CEPHALEXIN 500 MG PO CAPS: 500.0000 mg | ORAL_CAPSULE | Freq: Four times a day (QID) | ORAL | 0 refills | Status: DC

## 2016-11-18 NOTE — Addendum Note (Signed)
Addended by: Karle Barr on: 11/18/2016 03:17 PM   Modules accepted: Orders

## 2016-12-08 ENCOUNTER — Other Ambulatory Visit: Payer: Self-pay | Admitting: Family Medicine

## 2017-01-28 ENCOUNTER — Telehealth: Payer: Self-pay | Admitting: Family Medicine

## 2017-01-28 ENCOUNTER — Other Ambulatory Visit: Payer: Self-pay | Admitting: Family Medicine

## 2017-01-28 MED ORDER — CLINDAMYCIN HCL 300 MG PO CAPS: 300.0000 mg | ORAL_CAPSULE | Freq: Three times a day (TID) | ORAL | 0 refills | Status: DC

## 2017-01-28 NOTE — Telephone Encounter (Signed)
I spoke with the on-call nurse with the telephone service.  Patient broke off a tooth has infection in her gumline cannot get to the dentist today.  Clindamycin 1 3 times daily was sent in to [facility] as requested patient was instructed to follow-up with dentist if ongoing troubles

## 2017-02-08 ENCOUNTER — Telehealth: Payer: Self-pay | Admitting: Family Medicine

## 2017-02-08 NOTE — Telephone Encounter (Signed)
Patient requesting new prescription for phentemine 37.5 capsules stating her paper prescription had expired. [facility]

## 2017-02-10 NOTE — Telephone Encounter (Signed)
Left message to return call 

## 2017-02-10 NOTE — Telephone Encounter (Signed)
No- pt to follow up mid jan or feb we can discuss again then

## 2017-02-11 ENCOUNTER — Other Ambulatory Visit: Payer: Self-pay | Admitting: Nurse Practitioner

## 2017-02-11 MED ORDER — CLINDAMYCIN HCL 300 MG PO CAPS: 300.0000 mg | ORAL_CAPSULE | Freq: Three times a day (TID) | ORAL | 0 refills | Status: DC

## 2017-02-11 NOTE — Telephone Encounter (Signed)
Patient advised that Dr Nicki Reaper is declining to refill Adipex and will discuss further at her follow up office visit in mid January.  Patient verbalized understanding and scheduled follow up office visit

## 2017-02-11 NOTE — Telephone Encounter (Addendum)
Patient needs refill for antibiotic (cleocin ) for tooth infection- Patient will check with pharmacy later today

## 2017-02-11 NOTE — Telephone Encounter (Signed)
Done

## 2017-03-01 ENCOUNTER — Telehealth: Payer: Self-pay | Admitting: Family Medicine

## 2017-03-01 NOTE — Telephone Encounter (Signed)
So noted 

## 2017-03-01 NOTE — Telephone Encounter (Signed)
Left message to return call 

## 2017-03-01 NOTE — Telephone Encounter (Signed)
Patient has an appointment on 03/07/17 at 9:00 with Dr. Nicki Reaper for her medication.  She said this is the day that her xanax is due to be filled.  She wants to know if Dr. Nicki Reaper can go ahead and send in the refill so that she can be at the pharmacy when they open that day to pick her Xanax up and then come to her appointment.   Also, patient would like to change her birth control to [place].  [facility] told her that this medication has been discontinued and she was changed to [facility]inte[facility], but she wants to know if Dr. Nicki Reaper asks specifically for her to have this medication will they give this to her.  [facility]

## 2017-03-01 NOTE — Telephone Encounter (Signed)
Patient notified and stated she will talk to doctor at her visit on the 14th because she always fills it every 29 days because she has to take extra and runs out for the 30th day and it has never been  problem before. She wants t get the script on 03/07/17 at her visit and fill it that day not the 15th

## 2017-03-01 NOTE — Telephone Encounter (Signed)
I checked the drug registry.  According to the drug registry she got the prescription filled on the 16th.  Therefore she can get the next one filled on the 15th.  Her appointment is on the 14th.  She should keep her appointment.  As for birth control pills I cannot control what is covered and what is not covered by Medicaid she can talk to the pharmacist to see what other birth control pills are covered we can discuss this further at her next office visit.  Please asked the patient who is her female gynecologist?  When was last time she had a female health checkups?

## 2017-03-04 ENCOUNTER — Telehealth: Payer: Self-pay | Admitting: Family Medicine

## 2017-03-04 NOTE — Telephone Encounter (Signed)
Pt called stating that the pharmacy is telling her that her xanax can be filled on the 14th. Pt is just wanting to make sure that the Dr. Is aware of this.

## 2017-03-04 NOTE — Telephone Encounter (Addendum)
Pharmacy is willing to fill on day 29 and patient wants to fill it on day 29- states she always fills it early. Patient wants you to know this for her appointment on 03/07/17- see other message from 03/01/17

## 2017-03-04 NOTE — Telephone Encounter (Signed)
It was filled the 16th- Patient states she always gets it filled one day early on the 29th day which is the 14th - sometimes she may have to take an extra one or 2 and needs it on the 29th day or she runs out and that is when she wants to keep getting it every 29 days instead of every 30 days.

## 2017-03-04 NOTE — Telephone Encounter (Signed)
Either the drug registry is right or it is wrong-please verify with her pharmacy what was the actual date that she got her Xanax filled?  15th or the 16th?

## 2017-03-07 ENCOUNTER — Encounter: Payer: Self-pay | Admitting: Family Medicine

## 2017-03-07 ENCOUNTER — Telehealth: Payer: Self-pay | Admitting: Family Medicine

## 2017-03-07 ENCOUNTER — Ambulatory Visit (INDEPENDENT_AMBULATORY_CARE_PROVIDER_SITE_OTHER): Payer: Medicaid Other | Admitting: Family Medicine

## 2017-03-07 VITALS — BP 138/84 | Ht 61.0 in | Wt 227.0 lb

## 2017-03-07 DIAGNOSIS — E781 Pure hyperglyceridemia: Secondary | ICD-10-CM | POA: Diagnosis not present

## 2017-03-07 DIAGNOSIS — Z79899 Other long term (current) drug therapy: Secondary | ICD-10-CM

## 2017-03-07 DIAGNOSIS — F411 Generalized anxiety disorder: Secondary | ICD-10-CM

## 2017-03-07 DIAGNOSIS — R739 Hyperglycemia, unspecified: Secondary | ICD-10-CM

## 2017-03-07 MED ORDER — OMEPRAZOLE 20 MG PO CPDR: DELAYED_RELEASE_CAPSULE | ORAL | 5 refills | Status: DC

## 2017-03-07 MED ORDER — ALPRAZOLAM 1 MG PO TABS: 1.0000 mg | ORAL_TABLET | Freq: Four times a day (QID) | ORAL | 4 refills | Status: DC | PRN

## 2017-03-07 MED ORDER — PHENTERMINE HCL 37.5 MG PO CAPS: 37.5000 mg | ORAL_CAPSULE | ORAL | 1 refills | Status: DC

## 2017-03-07 MED ORDER — PATADAY 0.2 % OP SOLN: 1.0000 [drp] | Freq: Every day | OPHTHALMIC | 5 refills | Status: DC

## 2017-03-07 MED ORDER — ALPRAZOLAM 1 MG PO TABS: 1.0000 mg | ORAL_TABLET | Freq: Four times a day (QID) | ORAL | 5 refills | Status: DC | PRN

## 2017-03-07 MED ORDER — OLOPATADINE HCL 0.2 % OP SOLN: OPHTHALMIC | 3 refills | Status: DC

## 2017-03-07 NOTE — Telephone Encounter (Signed)
This issue was resolved at the office visit today

## 2017-03-07 NOTE — Progress Notes (Signed)
   Subjective:    Patient ID: Belinda [name], female    DOB: 1980-05-29, 37 y.o.   MRN: [id number]  Anxiety  Presents for follow-up visit. Patient reports no chest pain or shortness of breath.    needs refill on xanax.  She denies being depressed very anxious though.  The patient lays out a significant amount of issues today denies being depressed  Wants to start back on phentermine.  She is trying to watch her diet she does do some exercise she denies high fever chills sweats  She has a history of elevated cholesterol she does state her diet could be better she is interested in some counseling   Review of Systems  Constitutional: Negative for activity change, fatigue and fever.  HENT: Negative for congestion.   Respiratory: Negative for cough, chest tightness and shortness of breath.   Cardiovascular: Negative for chest pain and leg swelling.  Gastrointestinal: Negative for abdominal pain.  Skin: Negative for color change.  Neurological: Negative for headaches.  Psychiatric/Behavioral: Negative for behavioral problems.       Objective:   Physical Exam  Constitutional: She appears well-developed and well-nourished. No distress.  HENT:  Head: Normocephalic and atraumatic.  Eyes: Right eye exhibits no discharge. Left eye exhibits no discharge.  Neck: No tracheal deviation present.  Cardiovascular: Normal rate, regular rhythm and normal heart sounds.  No murmur heard. Pulmonary/Chest: Effort normal and breath sounds normal. No respiratory distress. She has no wheezes. She has no rales.  Musculoskeletal: She exhibits no edema.  Lymphadenopathy:    She has no cervical adenopathy.  Neurological: She is alert. She exhibits normal muscle tone.  Skin: Skin is warm and dry. No erythema.  Psychiatric: Her behavior is normal.  Vitals reviewed.   25 minutes was spent with the patient. Greater than half the time was spent in discussion and answering questions and counseling  regarding the issues that the patient came in for today.       Assessment & Plan:  1. Generalized anxiety disorder Significant anxiety issues we have tried numerous antidepressants to try to help her with this she is not tolerated any of them in addition to this she does use her Xanax 4 times a day at times she has gotten her prescription filled earlier than what I would like to see I stressed to her today the importance of taking her Xanax only 4 times per day prescription was written along with refills - Ambulatory referral to Psychology  2. Morbid obesity ([place]) This patient does try to watch how she eats I believe she has a hard time understanding what she should be eating in addition to this she does use phenteramine intermittently to try to help her with weight loss I am not opposed to this but I do not feel it is a long-term solution healthy eating regular physical activity would be better - Ambulatory referral to diabetic education  3. Hypertriglyceridemia History of elevated lipids watch diet check lab work await results - Lipid panel  4. Hyperglycemia History of hyperglycemia and obesity check kidney function and sugar - Basic metabolic panel  5. High risk medication use At risk for fatty liver check liver profile - Hepatic function panel  Patient agrees to nutritional counseling Patient agrees to psychology counseling Patient does not want psychiatry

## 2017-03-07 NOTE — Telephone Encounter (Signed)
Patient wants a nurse to call her back regarding questions on her lab work.

## 2017-03-07 NOTE — Telephone Encounter (Signed)
Left message to return call 

## 2017-03-07 NOTE — Telephone Encounter (Signed)
Patient was calling to check to if a drug test was ordered on her blood work because she is clean and doesn't need a drug test. I informed the patient that it was a cholesterol panel, liver test, glucose and kidney function test- not a drug test.

## 2017-03-29 ENCOUNTER — Ambulatory Visit (HOSPITAL_COMMUNITY): Payer: Self-pay | Admitting: Licensed Clinical Social Worker

## 2017-04-04 ENCOUNTER — Other Ambulatory Visit: Payer: Self-pay | Admitting: Family Medicine

## 2017-04-18 ENCOUNTER — Other Ambulatory Visit: Payer: Self-pay | Admitting: Family Medicine

## 2017-04-18 ENCOUNTER — Telehealth: Payer: Self-pay | Admitting: Family Medicine

## 2017-04-18 MED ORDER — CLINDAMYCIN HCL 300 MG PO CAPS: 300.0000 mg | ORAL_CAPSULE | Freq: Three times a day (TID) | ORAL | 0 refills | Status: DC

## 2017-04-18 NOTE — Telephone Encounter (Signed)
Prescription was sent to [facility] clindamycin 3 times a day for the next 7 days

## 2017-04-18 NOTE — Telephone Encounter (Signed)
Patient said she needs an antibiotic because of her tooth bothering her.  Also, would like referral to a dentist that Dr. Nicki Reaper recommends.  [facility] [employer]

## 2017-04-18 NOTE — Telephone Encounter (Signed)
I called and left a detailed message that a medication has been sent to [facility] under her name and to please call our office to confirm she received this message.

## 2017-04-27 ENCOUNTER — Telehealth: Payer: Self-pay | Admitting: Family Medicine

## 2017-04-27 ENCOUNTER — Other Ambulatory Visit: Payer: Self-pay | Admitting: Family Medicine

## 2017-04-27 MED ORDER — PATADAY 0.2 % OP SOLN: 1.0000 [drp] | Freq: Every day | OPHTHALMIC | 5 refills | Status: DC

## 2017-04-27 MED ORDER — PENICILLIN V POTASSIUM 500 MG PO TABS: 500.0000 mg | ORAL_TABLET | Freq: Four times a day (QID) | ORAL | 0 refills | Status: DC

## 2017-04-27 NOTE — Telephone Encounter (Signed)
Spoke with patient.Put in order for Penicillin and pt had order for Pataday eye drops so I reordered them and let patient know about [facility] in [place].

## 2017-04-27 NOTE — Telephone Encounter (Signed)
Patient is requesting prescription for eye drops for her allergies states eyes are puffy,swollen theBascom- Eye drops. She is requesting a referral to a dentist for bad tooth and which she would like antibiotic called in. The days she would like for dentist appointment thursday or Friday. She uses [address]

## 2017-04-27 NOTE — Telephone Encounter (Signed)
For the bad tooth I recommend penicillin VK 500 mg 1 4 times daily for the next 7 days, for the eyes I recommend Pataday 1 drop each eye as needed, as for sentences I recommend the patient call the health department or call area dentist the following morning on Medicaid

## 2017-08-17 ENCOUNTER — Telehealth: Payer: Self-pay | Admitting: Family Medicine

## 2017-08-17 ENCOUNTER — Ambulatory Visit (INDEPENDENT_AMBULATORY_CARE_PROVIDER_SITE_OTHER): Payer: Medicaid Other | Admitting: Family Medicine

## 2017-08-17 ENCOUNTER — Encounter: Payer: Self-pay | Admitting: Family Medicine

## 2017-08-17 VITALS — BP 130/68 | Ht 61.0 in | Wt 223.0 lb

## 2017-08-17 DIAGNOSIS — L409 Psoriasis, unspecified: Secondary | ICD-10-CM

## 2017-08-17 DIAGNOSIS — F411 Generalized anxiety disorder: Secondary | ICD-10-CM

## 2017-08-17 MED ORDER — ALPRAZOLAM 1 MG PO TABS: 1.0000 mg | ORAL_TABLET | Freq: Four times a day (QID) | ORAL | 5 refills | Status: DC | PRN

## 2017-08-17 MED ORDER — PENICILLIN V POTASSIUM 500 MG PO TABS: 500.0000 mg | ORAL_TABLET | Freq: Four times a day (QID) | ORAL | 0 refills | Status: DC

## 2017-08-17 NOTE — Telephone Encounter (Signed)
Left message to return call 

## 2017-08-17 NOTE — Telephone Encounter (Signed)
Patient is requesting a nurse to call her back about her Phentermine Rx.

## 2017-08-17 NOTE — Progress Notes (Signed)
   Subjective:    Patient ID: Belinda [name], female    DOB: 1980/05/28, 37 y.o.   MRN: [id number]  HPI Patient is here today to follow up on chronic illnesses, here today for Gad, Reflux. Patient is very self-conscious about her weight she does try to do the best she can at watching how she eats she does try to exercise she has had this problem for years  She has generalized anxiety disorder has had it for years on Xanax does not tolerate antidepressants denies any depression currently just finds herself feeling on edge a lot does not want to do any counseling has done this before does not really seem to help her  Review of Systems  Constitutional: Negative for activity change and appetite change.  HENT: Negative for congestion and rhinorrhea.   Respiratory: Negative for cough and shortness of breath.   Cardiovascular: Negative for chest pain and palpitations.  Gastrointestinal: Negative for abdominal pain and vomiting.  Skin: Negative for color change.  Neurological: Negative for dizziness and weakness.  Psychiatric/Behavioral: Negative for confusion.       Objective:   Physical Exam  Constitutional: She appears well-nourished. No distress.  Cardiovascular: Normal rate, regular rhythm and normal heart sounds.  No murmur heard. Pulmonary/Chest: Effort normal and breath sounds normal. No respiratory distress.  Musculoskeletal: She exhibits no edema.  Lymphadenopathy:    She has no cervical adenopathy.  Neurological: She is alert. She exhibits normal muscle tone.  Psychiatric: Her behavior is normal.  Vitals reviewed.    15 minutes was spent with patient today discussing healthcare issues which they came.  More than 50% of this visit-total duration of visit-was spent in counseling and coordination of care.  Please see diagnosis regarding the focus of this coordination and care  Has mild psoriasis    Assessment & Plan:  Generalized anxiety disorder Xanax 4 times per day she  is been on this for years she is faithful of getting her medicines filled  Weight issues I do not recommend phenteramine at the current moment I recommend healthy eating regular physical activity and bring weight down  Dermatology referral because she is concerned about her psoriasis she wonders if [facility] would be helpful although I told her that it would be up to dermatology that most of the time [facility] is used in severe psoriasis and also has to get insurance approval  Significant obesity at risk for diabetes I recommend nutritional consultation and also recommend healthy eating regular physical activity

## 2017-08-19 ENCOUNTER — Encounter: Payer: Self-pay | Admitting: Family Medicine

## 2017-08-19 LAB — BASIC METABOLIC PANEL
BUN/Creatinine Ratio: 15 (ref 9–23)
BUN: 12 mg/dL (ref 6–20)
CO2: 21 mmol/L (ref 20–29)
Calcium: 9.2 mg/dL (ref 8.7–10.2)
Chloride: 109 mmol/L — ABNORMAL HIGH (ref 96–106)
Creatinine, Ser: 0.78 mg/dL (ref 0.57–1.00)
GFR calc Af Amer: 113 mL/min/{1.73_m2} (ref >59)
GFR calc non Af Amer: 98 mL/min/{1.73_m2} (ref >59)
Glucose: 96 mg/dL (ref 65–99)
Potassium: 4.6 mmol/L (ref 3.5–5.2)
Sodium: 144 mmol/L (ref 134–144)

## 2017-08-19 LAB — LIPID PANEL
Chol/HDL Ratio: 4.7 ratio — ABNORMAL HIGH (ref 0.0–4.4)
Cholesterol, Total: 164 mg/dL (ref 100–199)
HDL: 35 mg/dL — ABNORMAL LOW (ref >39)
LDL Calculated: 90 mg/dL (ref 0–99)
Triglycerides: 196 mg/dL — ABNORMAL HIGH (ref 0–149)
VLDL Cholesterol Cal: 39 mg/dL (ref 5–40)

## 2017-08-19 LAB — HEPATIC FUNCTION PANEL
ALT: 30 IU/L (ref 0–32)
AST: 27 IU/L (ref 0–40)
Albumin: 4.3 g/dL (ref 3.5–5.5)
Alkaline Phosphatase: 60 IU/L (ref 39–117)
Bilirubin Total: 0.3 mg/dL (ref 0.0–1.2)
Bilirubin, Direct: 0.12 mg/dL (ref 0.00–0.40)
Total Protein: 7 g/dL (ref 6.0–8.5)

## 2017-08-22 ENCOUNTER — Other Ambulatory Visit: Payer: Self-pay | Admitting: Family Medicine

## 2017-08-22 NOTE — Telephone Encounter (Signed)
Left message to return call 

## 2017-08-23 NOTE — Telephone Encounter (Signed)
Left message to return call 

## 2017-08-26 NOTE — Telephone Encounter (Signed)
Left message to return call; also mailed green card

## 2017-08-30 ENCOUNTER — Encounter: Payer: Self-pay | Admitting: Family Medicine

## 2017-08-30 ENCOUNTER — Telehealth: Payer: Self-pay | Admitting: Family Medicine

## 2017-08-30 NOTE — Telephone Encounter (Signed)
Left message to return call. Tried to call significant other phone but voicemail was full

## 2017-08-30 NOTE — Telephone Encounter (Signed)
Pt's fiance called in to see if another medication could be called in for her teeth. The medication that was called in is not helping. Please send to [id number] - [place], [address]. [address]

## 2017-09-01 ENCOUNTER — Encounter (HOSPITAL_COMMUNITY): Payer: Self-pay

## 2017-09-01 ENCOUNTER — Emergency Department (HOSPITAL_COMMUNITY): Payer: Medicaid Other

## 2017-09-01 ENCOUNTER — Emergency Department (HOSPITAL_COMMUNITY)
Admission: EM | Admit: 2017-09-01 | Discharge: 2017-09-02 | Disposition: A | Discharge status: Home / Self Care | Payer: Medicaid Other | Attending: Emergency Medicine | Admitting: Emergency Medicine

## 2017-09-01 ENCOUNTER — Other Ambulatory Visit: Payer: Self-pay

## 2017-09-01 DIAGNOSIS — Z87891 Personal history of nicotine dependence: Secondary | ICD-10-CM | POA: Diagnosis not present

## 2017-09-01 DIAGNOSIS — Y998 Other external cause status: Secondary | ICD-10-CM | POA: Diagnosis not present

## 2017-09-01 DIAGNOSIS — R0902 Hypoxemia: Secondary | ICD-10-CM | POA: Diagnosis not present

## 2017-09-01 DIAGNOSIS — S0990XA Unspecified injury of head, initial encounter: Secondary | ICD-10-CM | POA: Diagnosis not present

## 2017-09-01 DIAGNOSIS — M25519 Pain in unspecified shoulder: Secondary | ICD-10-CM | POA: Diagnosis not present

## 2017-09-01 DIAGNOSIS — J45909 Unspecified asthma, uncomplicated: Secondary | ICD-10-CM | POA: Insufficient documentation

## 2017-09-01 DIAGNOSIS — G4489 Other headache syndrome: Secondary | ICD-10-CM | POA: Diagnosis not present

## 2017-09-01 DIAGNOSIS — S46912A Strain of unspecified muscle, fascia and tendon at shoulder and upper arm level, left arm, initial encounter: Secondary | ICD-10-CM | POA: Diagnosis not present

## 2017-09-01 DIAGNOSIS — R52 Pain, unspecified: Secondary | ICD-10-CM | POA: Diagnosis not present

## 2017-09-01 DIAGNOSIS — R55 Syncope and collapse: Secondary | ICD-10-CM | POA: Diagnosis not present

## 2017-09-01 DIAGNOSIS — M25551 Pain in right hip: Secondary | ICD-10-CM | POA: Diagnosis not present

## 2017-09-01 DIAGNOSIS — Y939 Activity, unspecified: Secondary | ICD-10-CM | POA: Diagnosis not present

## 2017-09-01 DIAGNOSIS — S161XXA Strain of muscle, fascia and tendon at neck level, initial encounter: Secondary | ICD-10-CM | POA: Insufficient documentation

## 2017-09-01 DIAGNOSIS — S3992XA Unspecified injury of lower back, initial encounter: Secondary | ICD-10-CM | POA: Diagnosis not present

## 2017-09-01 DIAGNOSIS — R51 Headache: Secondary | ICD-10-CM | POA: Diagnosis not present

## 2017-09-01 DIAGNOSIS — S39012A Strain of muscle, fascia and tendon of lower back, initial encounter: Secondary | ICD-10-CM | POA: Diagnosis not present

## 2017-09-01 DIAGNOSIS — M25512 Pain in left shoulder: Secondary | ICD-10-CM | POA: Diagnosis not present

## 2017-09-01 DIAGNOSIS — Y92410 Unspecified street and highway as the place of occurrence of the external cause: Secondary | ICD-10-CM | POA: Insufficient documentation

## 2017-09-01 DIAGNOSIS — Z79899 Other long term (current) drug therapy: Secondary | ICD-10-CM | POA: Diagnosis not present

## 2017-09-01 DIAGNOSIS — M545 Low back pain: Secondary | ICD-10-CM | POA: Diagnosis not present

## 2017-09-01 DIAGNOSIS — M542 Cervicalgia: Secondary | ICD-10-CM | POA: Diagnosis not present

## 2017-09-01 DIAGNOSIS — M25552 Pain in left hip: Secondary | ICD-10-CM | POA: Diagnosis not present

## 2017-09-01 DIAGNOSIS — S199XXA Unspecified injury of neck, initial encounter: Secondary | ICD-10-CM | POA: Diagnosis not present

## 2017-09-01 DIAGNOSIS — S4992XA Unspecified injury of left shoulder and upper arm, initial encounter: Secondary | ICD-10-CM | POA: Diagnosis not present

## 2017-09-01 LAB — COMPREHENSIVE METABOLIC PANEL WITH GFR
ALT: 35 U/L (ref 0–44)
AST: 29 U/L (ref 15–41)
Albumin: 4.3 g/dL (ref 3.5–5.0)
Alkaline Phosphatase: 57 U/L (ref 38–126)
Anion gap: 10 (ref 5–15)
BUN: 13 mg/dL (ref 6–20)
CO2: 24 mmol/L (ref 22–32)
Calcium: 9.5 mg/dL (ref 8.9–10.3)
Chloride: 109 mmol/L (ref 98–111)
Creatinine, Ser: 0.86 mg/dL (ref 0.44–1.00)
GFR calc Af Amer: >60 mL/min
GFR calc non Af Amer: >60 mL/min
Glucose, Bld: 125 mg/dL — ABNORMAL HIGH (ref 70–99)
Potassium: 3.4 mmol/L — ABNORMAL LOW (ref 3.5–5.1)
Sodium: 143 mmol/L (ref 135–145)
Total Bilirubin: 0.5 mg/dL (ref 0.3–1.2)
Total Protein: 8 g/dL (ref 6.5–8.1)

## 2017-09-01 LAB — CBC WITH DIFFERENTIAL/PLATELET
Basophils Absolute: 0 K/uL (ref 0.0–0.1)
Basophils Relative: 0 %
Eosinophils Absolute: 0.1 K/uL (ref 0.0–0.7)
Eosinophils Relative: 2 %
HCT: 41.8 % (ref 36.0–46.0)
Hemoglobin: 14.4 g/dL (ref 12.0–15.0)
Lymphocytes Relative: 44 %
Lymphs Abs: 3.3 K/uL (ref 0.7–4.0)
MCH: 31.6 pg (ref 26.0–34.0)
MCHC: 34.4 g/dL (ref 30.0–36.0)
MCV: 91.7 fL (ref 78.0–100.0)
Monocytes Absolute: 0.6 K/uL (ref 0.1–1.0)
Monocytes Relative: 9 %
Neutro Abs: 3.3 K/uL (ref 1.7–7.7)
Neutrophils Relative %: 45 %
Platelets: 240 K/uL (ref 150–400)
RBC: 4.56 MIL/uL (ref 3.87–5.11)
RDW: 12.5 % (ref 11.5–15.5)
WBC: 7.4 K/uL (ref 4.0–10.5)

## 2017-09-01 LAB — URINALYSIS, ROUTINE W REFLEX MICROSCOPIC
Bilirubin Urine: NEGATIVE
Glucose, UA: NEGATIVE mg/dL
Hgb urine dipstick: NEGATIVE
Ketones, ur: NEGATIVE mg/dL
Leukocytes, UA: NEGATIVE
Nitrite: NEGATIVE
Protein, ur: NEGATIVE mg/dL
Specific Gravity, Urine: 1.026 (ref 1.005–1.030)
pH: 5 (ref 5.0–8.0)

## 2017-09-01 LAB — POC URINE PREG, ED: Preg Test, Ur: NEGATIVE

## 2017-09-01 MED ORDER — CYCLOBENZAPRINE HCL 10 MG PO TABS
10.0000 mg | ORAL_TABLET | Freq: Once | ORAL | Status: AC
  Administered 2017-09-01: 10 mg via ORAL
  Filled 2017-09-01: qty 1

## 2017-09-01 MED ORDER — IBUPROFEN 800 MG PO TABS
800.0000 mg | ORAL_TABLET | Freq: Once | ORAL | Status: AC
  Administered 2017-09-01: 800 mg via ORAL
  Filled 2017-09-01: qty 1

## 2017-09-01 MED ORDER — CYCLOBENZAPRINE HCL 10 MG PO TABS: 10.0000 mg | ORAL_TABLET | Freq: Three times a day (TID) | ORAL | 0 refills | Status: DC | PRN

## 2017-09-01 MED ORDER — MORPHINE SULFATE (PF) 4 MG/ML IV SOLN
4.0000 mg | Freq: Once | INTRAVENOUS | Status: AC
  Administered 2017-09-01: 4 mg via INTRAVENOUS
  Filled 2017-09-01: qty 1

## 2017-09-01 MED ORDER — IBUPROFEN 800 MG PO TABS: 800.0000 mg | ORAL_TABLET | Freq: Three times a day (TID) | ORAL | 0 refills | Status: DC

## 2017-09-01 MED ORDER — ONDANSETRON HCL 4 MG/2ML IJ SOLN
4.0000 mg | Freq: Once | INTRAMUSCULAR | Status: AC
  Administered 2017-09-01: 4 mg via INTRAVENOUS
  Filled 2017-09-01: qty 2

## 2017-09-01 MED ORDER — HYDROCODONE-ACETAMINOPHEN 5-325 MG PO TABS: ORAL_TABLET | ORAL | 0 refills | Status: DC
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
## 2017-09-01 NOTE — Discharge Instructions (Addendum)
Apply ice packs on/off to your neck, back and shoulder.  Call your primary provider to arrange a follow-up appt in a few days.  Return to ER for any worsening symptoms
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
## 2017-09-06 ENCOUNTER — Ambulatory Visit: Payer: Medicaid Other | Admitting: Family Medicine

## 2017-09-06 ENCOUNTER — Encounter: Payer: Self-pay | Admitting: Family Medicine

## 2017-09-06 VITALS — BP 122/84 | Ht 61.0 in

## 2017-09-06 DIAGNOSIS — M898X1 Other specified disorders of bone, shoulder: Secondary | ICD-10-CM | POA: Diagnosis not present

## 2017-09-06 DIAGNOSIS — R319 Hematuria, unspecified: Secondary | ICD-10-CM | POA: Diagnosis not present

## 2017-09-06 DIAGNOSIS — M545 Low back pain, unspecified: Secondary | ICD-10-CM

## 2017-09-06 LAB — POCT URINALYSIS DIPSTICK
Protein, UA: POSITIVE — AB
Spec Grav, UA: 1.025 (ref 1.010–1.025)
pH, UA: 5 (ref 5.0–8.0)

## 2017-09-06 MED ORDER — CYCLOBENZAPRINE HCL 10 MG PO TABS: 10.0000 mg | ORAL_TABLET | Freq: Three times a day (TID) | ORAL | 1 refills | Status: DC | PRN

## 2017-09-06 MED ORDER — HYDROCODONE-ACETAMINOPHEN 5-325 MG PO TABS: ORAL_TABLET | ORAL | 0 refills | Status: DC
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
## 2017-09-07 ENCOUNTER — Ambulatory Visit: Payer: Medicaid Other | Admitting: Family Medicine

## 2017-09-08 ENCOUNTER — Encounter: Payer: Self-pay | Admitting: Family Medicine

## 2017-09-08 LAB — URINE CULTURE

## 2017-09-08 LAB — SPECIMEN STATUS REPORT

## 2017-09-12 ENCOUNTER — Encounter: Payer: Self-pay | Admitting: Family Medicine

## 2017-09-12 ENCOUNTER — Other Ambulatory Visit: Payer: Self-pay | Admitting: *Deleted

## 2017-09-12 MED ORDER — CEPHALEXIN 500 MG PO CAPS: 500.0000 mg | ORAL_CAPSULE | Freq: Four times a day (QID) | ORAL | 0 refills | Status: DC

## 2017-09-14 NOTE — Addendum Note (Signed)
Addended by: Dairl Ponder on: 09/14/2017 09:20 AM   Modules accepted: Orders

## 2017-09-20 ENCOUNTER — Telehealth: Payer: Self-pay | Admitting: Family Medicine

## 2017-09-20 NOTE — Telephone Encounter (Signed)
Pt is requesting a refill on cyclobenzaprine (FLEXERIL) 10 MG tablet until she meets with Dr. Nicki Reaper in August to discuss if she is ready for PT and what her goal will be with the PT. She also is still having numbness in areas at time and then she will get a shooting pain after that. She is also popping more than normal and wants to know if this is expected.

## 2017-09-21 ENCOUNTER — Telehealth: Payer: Self-pay | Admitting: Family Medicine

## 2017-09-21 NOTE — Telephone Encounter (Signed)
Her symptoms are within what one might expect I recommend the Flexeril only be at nighttime 1 nightly as needed #21 The medication causes too much drowsiness to use during the daytime

## 2017-09-21 NOTE — Telephone Encounter (Signed)
Patient said she found out she already had a refill on her Flexeril that she just found out about.  Also, she said she was given Keflex because of e.coli in her urinary tract by Dr. Nicki Reaper.  She said she took this before and never had a problem with it, but this time she is getting really hot in her face and ears, ringing in her ears, increased anxiety, palpitations, making her feel bad.  She wants to know what Dr. Nicki Reaper recommends.

## 2017-09-21 NOTE — Telephone Encounter (Signed)
I doubt that this is an allergy to her Keflex Instead go with Bactrim DS 1 twice daily for 3 days

## 2017-09-21 NOTE — Telephone Encounter (Signed)
Left message to return call 

## 2017-09-22 ENCOUNTER — Other Ambulatory Visit: Payer: Self-pay | Admitting: Family Medicine

## 2017-09-22 MED ORDER — SULFAMETHOXAZOLE-TRIMETHOPRIM 800-160 MG PO TABS: ORAL_TABLET | ORAL | 0 refills | Status: DC

## 2017-09-22 NOTE — Progress Notes (Unsigned)
bact

## 2017-09-22 NOTE — Telephone Encounter (Signed)
Spoke with patient. Pt had refill on Flexeril and did not know that so she went ahead and got that refill. Pt verbalized understanding

## 2017-09-22 NOTE — Telephone Encounter (Signed)
Spoke with patient. Pt verbalized understanding. Medication sent in

## 2017-09-28 ENCOUNTER — Encounter: Payer: Self-pay | Admitting: Family Medicine

## 2017-09-28 ENCOUNTER — Telehealth: Payer: Self-pay | Admitting: Family Medicine

## 2017-09-28 ENCOUNTER — Ambulatory Visit (INDEPENDENT_AMBULATORY_CARE_PROVIDER_SITE_OTHER): Payer: Medicaid Other | Admitting: Family Medicine

## 2017-09-28 VITALS — BP 136/88 | Ht 61.0 in

## 2017-09-28 DIAGNOSIS — M62838 Other muscle spasm: Secondary | ICD-10-CM | POA: Diagnosis not present

## 2017-09-28 DIAGNOSIS — R319 Hematuria, unspecified: Secondary | ICD-10-CM | POA: Diagnosis not present

## 2017-09-28 DIAGNOSIS — M898X1 Other specified disorders of bone, shoulder: Secondary | ICD-10-CM | POA: Diagnosis not present

## 2017-09-28 DIAGNOSIS — M545 Low back pain, unspecified: Secondary | ICD-10-CM

## 2017-09-28 LAB — POCT URINALYSIS DIPSTICK
Spec Grav, UA: 1.025 (ref 1.010–1.025)
pH, UA: 5 (ref 5.0–8.0)

## 2017-09-28 MED ORDER — CYCLOBENZAPRINE HCL 10 MG PO TABS: 10.0000 mg | ORAL_TABLET | Freq: Three times a day (TID) | ORAL | 1 refills | Status: DC | PRN
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
## 2017-09-28 NOTE — Telephone Encounter (Signed)
Pt in office today and inquired about getting her husband in. Also pt wanted to know about the Medicaid paperwork she dropped off last week. She also wanted to know if she could have a dietician referral.

## 2017-09-29 NOTE — Telephone Encounter (Signed)
Per Dr Nicki Reaper find out name of spouse and date oft birth and let him check the registry before he determines if he will take him as a patient

## 2017-09-29 NOTE — Telephone Encounter (Signed)
Nurses go ahead with dietary referral All I had was copies of her Medicaid sheet.  And those are no longer present with [place]- currently [facility] administration states that they should be able to decide by late August or early September which Medicaid plans they are participating with so she can check with the business part of our office by 1 September to see if they have determine which plans we will be participating with  Finally we will accept her husband but it should be known that any new patient that is on pain medications or nerve medications it is important that they submit their medication list to [place] before we officially take them on

## 2017-09-29 NOTE — Addendum Note (Signed)
Addended by: Dairl Ponder on: 09/29/2017 10:00 AM   Modules accepted: Orders

## 2017-09-29 NOTE — Telephone Encounter (Signed)
Left message to return call 

## 2017-09-30 LAB — URINE CULTURE

## 2017-09-30 LAB — SPECIMEN STATUS REPORT

## 2017-10-05 ENCOUNTER — Other Ambulatory Visit: Payer: Self-pay | Admitting: Family Medicine

## 2017-10-06 NOTE — Telephone Encounter (Signed)
I spoke with the pt and she is aware that we placed the referral for her dietician. She also states that this man whom she wants you to take as a new pt is her Belinda [name] and his name is Belinda [name] Dob: 10/02/1972. I advised that we are  Not taking patients at current time and the ones we normally make an acception for are bonded by marriage, or our pts children etc,but I would notify our Dr's to see if they are willing to make an acception.

## 2017-10-18 ENCOUNTER — Ambulatory Visit (HOSPITAL_COMMUNITY): Payer: Medicaid Other | Attending: Family Medicine

## 2017-10-18 NOTE — Telephone Encounter (Signed)
Please let family know at this point time unable to take on new patients

## 2017-10-19 NOTE — Telephone Encounter (Signed)
Left message to return call 

## 2017-10-19 NOTE — Telephone Encounter (Signed)
Pt returned call. Informed pt that provider is unable to take new patients at this time. Pt verbalized understanding.

## 2017-11-01 ENCOUNTER — Ambulatory Visit (HOSPITAL_COMMUNITY): Payer: Medicaid Other

## 2017-11-01 ENCOUNTER — Telehealth (HOSPITAL_COMMUNITY): Payer: Self-pay | Admitting: Family Medicine

## 2017-11-01 NOTE — Telephone Encounter (Signed)
11/01/17  husband called to reschedule - said patient was sick this morning

## 2017-11-09 ENCOUNTER — Encounter (HOSPITAL_COMMUNITY): Payer: Self-pay

## 2017-11-09 ENCOUNTER — Ambulatory Visit (HOSPITAL_COMMUNITY): Payer: Medicaid Other | Attending: Family Medicine

## 2017-11-09 ENCOUNTER — Other Ambulatory Visit: Payer: Self-pay

## 2017-11-09 DIAGNOSIS — R262 Difficulty in walking, not elsewhere classified: Secondary | ICD-10-CM | POA: Insufficient documentation

## 2017-11-09 DIAGNOSIS — M6281 Muscle weakness (generalized): Secondary | ICD-10-CM | POA: Insufficient documentation

## 2017-11-09 DIAGNOSIS — M5442 Lumbago with sciatica, left side: Secondary | ICD-10-CM | POA: Diagnosis not present

## 2017-11-09 DIAGNOSIS — G8929 Other chronic pain: Secondary | ICD-10-CM | POA: Diagnosis not present
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
## 2017-11-11 ENCOUNTER — Telehealth: Payer: Self-pay | Admitting: Family Medicine

## 2017-11-11 DIAGNOSIS — M545 Low back pain, unspecified: Secondary | ICD-10-CM

## 2017-11-11 DIAGNOSIS — M898X1 Other specified disorders of bone, shoulder: Secondary | ICD-10-CM

## 2017-11-11 MED ORDER — CYCLOBENZAPRINE HCL 10 MG PO TABS: 10.0000 mg | ORAL_TABLET | Freq: Three times a day (TID) | ORAL | 0 refills | Status: DC | PRN

## 2017-11-11 NOTE — Telephone Encounter (Signed)
Prescription sent electronically to pharmacy. 

## 2017-11-11 NOTE — Telephone Encounter (Signed)
May have 1 refill

## 2017-11-11 NOTE — Telephone Encounter (Signed)
Patient requesting refill on flexeril 10 mg lasted filled 8/7. [facility] [address]

## 2017-11-17 ENCOUNTER — Ambulatory Visit (HOSPITAL_COMMUNITY): Payer: Medicaid Other | Admitting: Physical Therapy

## 2017-11-17 ENCOUNTER — Telehealth (HOSPITAL_COMMUNITY): Payer: Self-pay | Admitting: Family Medicine

## 2017-11-17 NOTE — Telephone Encounter (Signed)
11/17/17  her fiance called to [facility] said that she had too many things booked for today and can't come here

## 2017-11-22 ENCOUNTER — Telehealth (HOSPITAL_COMMUNITY): Payer: Self-pay | Admitting: Family Medicine

## 2017-11-22 NOTE — Telephone Encounter (Signed)
10/1/19this is game day and she can't come to therapy and go to her son's game.Marland Kitchen it's to much for her in walking becuase of her hip

## 2017-11-23 ENCOUNTER — Other Ambulatory Visit: Payer: Self-pay

## 2017-11-23 ENCOUNTER — Encounter (HOSPITAL_COMMUNITY): Payer: Self-pay

## 2017-11-23 ENCOUNTER — Ambulatory Visit (HOSPITAL_COMMUNITY): Payer: Medicaid Other | Attending: Family Medicine

## 2017-11-23 DIAGNOSIS — G8929 Other chronic pain: Secondary | ICD-10-CM | POA: Insufficient documentation

## 2017-11-23 DIAGNOSIS — M6281 Muscle weakness (generalized): Secondary | ICD-10-CM | POA: Insufficient documentation

## 2017-11-23 DIAGNOSIS — R262 Difficulty in walking, not elsewhere classified: Secondary | ICD-10-CM | POA: Diagnosis not present

## 2017-11-23 DIAGNOSIS — M5442 Lumbago with sciatica, left side: Secondary | ICD-10-CM | POA: Insufficient documentation
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
## 2017-11-25 ENCOUNTER — Encounter (HOSPITAL_COMMUNITY): Payer: Self-pay

## 2017-11-25 ENCOUNTER — Ambulatory Visit (HOSPITAL_COMMUNITY): Payer: Medicaid Other

## 2017-11-25 DIAGNOSIS — R262 Difficulty in walking, not elsewhere classified: Secondary | ICD-10-CM

## 2017-11-25 DIAGNOSIS — G8929 Other chronic pain: Secondary | ICD-10-CM | POA: Diagnosis not present

## 2017-11-25 DIAGNOSIS — M5442 Lumbago with sciatica, left side: Principal | ICD-10-CM

## 2017-11-25 DIAGNOSIS — M6281 Muscle weakness (generalized): Secondary | ICD-10-CM

## 2017-11-25 NOTE — Therapy (Signed)
[place], [place], [address] Phone: [phone]   Fax:  [phone]  Physical Therapy Treatment  Patient Details  Name: Belinda [name] MRN: [id number] Date of Birth: 10/15/1980 Referring Provider (PT): Kathyrn Drown, MD   Encounter Date: 11/25/2017  PT End of Session - 11/25/17 1247    Visit Number  3    Number of Visits  4    Date for PT Re-Evaluation  12/21/17   Minireassess 11/30/2017   Authorization Type  Medicaid    Authorization Time Period  11/09/17 - 12/23/17 (3 units approved from 11/12/17-11/25/17)    Authorization - Visit Number  2    Authorization - Number of Visits  3    PT Start Time  9563    PT Stop Time  1115    PT Time Calculation (min)  33 min    Activity Tolerance  Patient tolerated treatment well    Behavior During Therapy  [facility] for tasks assessed/performed       Past Medical History:  Diagnosis Date  . Anxiety   . Anxiety   . Asthma   . Bipolar affective ([place])   . Narcotic addiction ([place])   . Psoriasis   . Reactive airways dysfunction syndrome [facility])     Past Surgical History:  Procedure Laterality Date  . CESAREAN SECTION    . DILATION AND CURETTAGE OF UTERUS    . PID      There were no vitals filed for this visit.  Subjective Assessment - 11/25/17 1123    Subjective  Pt arrived stating compliance with HEP and have increased bowel movements following exercises.  Reports significant tingling burning pain on her heels and has been walking on tip toes to reduce pain, current pain scale 8/10 for heels Lt>Rt and LBP.      Patient Stated Goals  walking more - like to go to the park and walk around park; footbal games and practices    Currently in Pain?  Yes    Pain Score  8     Pain Location  Back   LBP and Bil heels Lt>Rt                      [facility] Adult PT Treatment/Exercise - 11/25/17 0001      Ambulation/Gait   Ambulation/Gait  Yes    Ambulation/Gait  Assistance  7: Independent    Ambulation Distance (Feet)  200 Feet    Assistive device  None    Gait Pattern  Step-to pattern;Decreased step length - left;Decreased stance time - left;Decreased hip/knee flexion - left;Decreased weight shift to left;Wide base of support;Lateral trunk lean to right;Antalgic    Ambulation Surface  Level;Indoor    Gait Comments  Cueing to improve mechanics wiht heel strike      Exercises   Exercises  Lumbar      Lumbar Exercises: Standing   Other Standing Lumbar Exercises  standing in front of mirror to improve awareness of lateral trunk shifting      Lumbar Exercises: Seated   Other Seated Lumbar Exercises  scapular retraction 10x      Lumbar Exercises: Supine   Ab Set  15 reps;5 seconds    Other Supine Lumbar Exercises  Leg press bil LE: 1x 5 reps, 5 sec holds    Other Supine Lumbar Exercises  Shoulder press: 1x 5 reps, 5 sec holds  PT Short Term Goals - 11/09/17 1845      PT SHORT TERM GOAL #1   Title  Patient will be independent with HEP, updated PRN, to improve functional LE strength and lumbar mobility as well as reduce radicular symptoms.    Time  2    Period  Weeks    Status  New    Target Date  11/23/17      PT SHORT TERM GOAL #2   Title  Patient will perform SLS for 15 seconds on bil LE to indicate improved balance for gait and stair mobility.    Time  3    Period  Weeks    Status  New    Target Date  11/30/17      PT SHORT TERM GOAL #3   Title  Patient will improve MMT for limited groups by 1/2 grade to demonstrate significant increase in LE strength to have greater muscle strength/endurance.    Time  3    Period  Weeks    Status  New        PT Long Term Goals - 11/09/17 1845      PT LONG TERM GOAL #1   Title  Patient will improve MMT for limited groups by 1 grade to demonstrate significant increase in LE strength to have greater muscle strength/endurance.    Time  6    Period  Weeks    Status  New     Target Date  12/21/17      PT LONG TERM GOAL #2   Title  Patient will perform SLS for 30 seconds on bil LE to indicate improved balance for gait and stair mobility.    Time  6    Period  Weeks    Status  New      PT LONG TERM GOAL #3   Title  Patient will improve ODI score by 12 points to indicate significant decrease in self reported disability related to back pain and improved functional mobility and QOL.    Time  6    Period  Weeks    Status  New      PT LONG TERM GOAL #4   Title  Patient will report being able to attend an entire footbaal game for her son without experiencing her knee bucklig, or radiating pains into her LT LE beyond her knee to demonstrate centralization of pain.    Time  6    Period  Weeks    Status  New            Plan - 11/25/17 1248    Clinical Impression Statement  Pt arrived with reports of increased pain in heels today.  Pt entered dept toe walking.  Pt educated on proper gait mechanics with multimodal max cueing to improve mechanics.  Reviewed compliance wiht HEP, pt able to demonstrate and verbalize exercises with min cueing for form wiht abdominal sets.  Pt with a lot of questions and required frequent cueing to focus on tasks at hand.  Pt educated on lateral trunk presentation wiht visual and verbal cueing to improve awareness.  Pt late for apt and unable to complete full POC due to time       Patient will benefit from skilled therapeutic intervention in order to improve the following deficits and impairments:     Visit Diagnosis: Chronic bilateral low back pain with left-sided sciatica  Muscle weakness (generalized)  Difficulty in walking, not elsewhere classified  Problem List Patient Active Problem List   Diagnosis Date Noted  . OCD (obsessive compulsive disorder) 09/01/2015  . GERD (gastroesophageal reflux disease) 03/24/2015  . Morbid obesity ([place]) 03/24/2015  . Hyperglycemia 03/24/2015  . Hypertriglyceridemia 03/24/2015  .  Obesity 01/07/2014  . 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 Generalized anxiety disorder 05/17/2012  . Reactive airway disease 05/12/2012  . Psoriasis 05/12/2012   Ihor Austin, [place]; [place]  Aldona Lento 11/25/2017, 1:40 PM  [place] [address] [place], [place], [address] Phone: [phone]   Fax:  [phone]  Name: Belinda [name] MRN: [id number] Date of Birth: 1980-10-28

## 2017-11-30 ENCOUNTER — Telehealth (HOSPITAL_COMMUNITY): Payer: Self-pay

## 2017-11-30 ENCOUNTER — Ambulatory Visit (HOSPITAL_COMMUNITY): Payer: Medicaid Other

## 2017-11-30 NOTE — Telephone Encounter (Signed)
I called Ms. Molter at her home/mobile number on file and informed her that her medicaid has not been approved yet and we need to cancel her appointment for today. I informed her that her next appointment is this Friday at 1:00 PM and that if she does not hear from [place] to come in for her therapy. I told her we will call if the medicaid is still not approved and we need to cancel the visit. She verbalized her understanding.  Kipp Brood, PT, DPT Physical Therapist with [facility]  11/30/2017 8:06 AM

## 2017-12-01 ENCOUNTER — Telehealth: Payer: Self-pay | Admitting: Family Medicine

## 2017-12-01 NOTE — Telephone Encounter (Signed)
Pt is requesting a refill on cyclobenzaprine (FLEXERIL) 10 MG tablet. Please send to [facility], [place] [address]

## 2017-12-02 ENCOUNTER — Ambulatory Visit (HOSPITAL_COMMUNITY): Payer: Medicaid Other

## 2017-12-02 ENCOUNTER — Telehealth (HOSPITAL_COMMUNITY): Payer: Self-pay

## 2017-12-02 ENCOUNTER — Other Ambulatory Visit: Payer: Self-pay

## 2017-12-02 MED ORDER — CYCLOBENZAPRINE HCL 10 MG PO TABS: 10.0000 mg | ORAL_TABLET | Freq: Three times a day (TID) | ORAL | 0 refills | Status: DC | PRN

## 2017-12-02 NOTE — Telephone Encounter (Signed)
We have not received approval from Medicaid yet per [facility].

## 2017-12-02 NOTE — Telephone Encounter (Signed)
Sent medication in and patient is aware.

## 2017-12-02 NOTE — Telephone Encounter (Signed)
1 refill not for frequent use-caution drowsiness

## 2017-12-03 ENCOUNTER — Other Ambulatory Visit: Payer: Self-pay | Admitting: Family Medicine

## 2017-12-07 ENCOUNTER — Ambulatory Visit (HOSPITAL_COMMUNITY): Payer: Medicaid Other

## 2017-12-07 ENCOUNTER — Encounter (HOSPITAL_COMMUNITY): Payer: Self-pay

## 2017-12-07 ENCOUNTER — Other Ambulatory Visit: Payer: Self-pay

## 2017-12-07 DIAGNOSIS — M5442 Lumbago with sciatica, left side: Secondary | ICD-10-CM | POA: Diagnosis not present

## 2017-12-07 DIAGNOSIS — M6281 Muscle weakness (generalized): Secondary | ICD-10-CM

## 2017-12-07 DIAGNOSIS — G8929 Other chronic pain: Secondary | ICD-10-CM

## 2017-12-07 DIAGNOSIS — R262 Difficulty in walking, not elsewhere classified: Secondary | ICD-10-CM | POA: Diagnosis not present

## 2017-12-07 NOTE — Therapy (Signed)
[place], [place], [address] Phone: [phone]   Fax:  [phone]  Physical Therapy Treatment  Patient Details  Name: Belinda [name] MRN: [id number] Date of Birth: [birth year] Referring Provider (PT): Kathyrn Drown, MD   Encounter Date: 12/07/2017  PT End of Session - 12/07/17 1349    Visit Number  4    Number of Visits  12    Date for PT Re-Evaluation  12/21/17   Minireassess 11/30/2017   Authorization Type  Medicaid    Authorization Time Period  11/09/17 - 12/23/17 (3 units approved from 11/12/17-11/25/17) (8 units approved 12/05/17-01/01/18)    Authorization - Visit Number  1    Authorization - Number of Visits  8    PT Start Time  1309    PT Stop Time  1350    PT Time Calculation (min)  41 min    Activity Tolerance  Patient tolerated treatment well    Behavior During Therapy  [facility] for tasks assessed/performed       Past Medical History:  Diagnosis Date  . Anxiety   . Anxiety   . Asthma   . Bipolar affective ([place])   . Narcotic addiction ([place])   . Psoriasis   . Reactive airways dysfunction syndrome [facility])     Past Surgical History:  Procedure Laterality Date  . CESAREAN SECTION    . DILATION AND CURETTAGE OF UTERUS    . PID      There were no vitals filed for this visit.  Subjective Assessment - 12/07/17 1313    Patient Stated Goals  walking more - like to go to the park and walk around park; footbal games and practices    Currently in Pain?  Yes    Pain Score  7     Pain Location  Back    Pain Orientation  Left    Pain Descriptors / Indicators  Aching    Pain Type  Chronic pain    Pain Radiating Towards  Lt butt and hip into Lt LE    Pain Onset  More than a month ago    Pain Frequency  Intermittent    Aggravating Factors   walking and standing prolonged periods, getting dressed    Pain Relieving Factors  knees bent laying down    Effect of Pain on Daily Activities  severe         [facility] Adult PT Treatment/Exercise - 12/07/17 0001      Exercises   Exercises  Lumbar      Lumbar Exercises: Stretches   Standing Extension  15 reps    Standing Extension Limitations  wall arch     Other Lumbar Stretch Exercise  Sciatic nerve glide: 10 reps Lt LE    Other Lumbar Stretch Exercise  Lateral shift correction: Lt side leanign against wall, manual self overpressure on Rt hip to correct shift, 10 reps, 5 sec holds      Lumbar Exercises: Seated   Other Seated Lumbar Exercises  W back: 1x 15 reps with 5 sec holds; Money with red TB 1x 10 reps, 5 sec holds    Other Seated Lumbar Exercises  D2 flexion: 10 reps Bil UE      Lumbar Exercises: Supine   Ab Set  15 reps;3 seconds    Bridge  15 reps;3 seconds;Limitations    Bridge Limitations  2 sets    Other Supine Lumbar Exercises  Decompression level 1: Leg  lengthener 1x 10 reps, 5 sec holds bil LE; Leg press bil LE: 1x 10 reps, 5 sec holds;    Other Supine Lumbar Exercises  D2 flexion with red TB, 1x 10 reps bil UE        PT Education - 12/07/17 1408    Education Details  Educated on exercises throughout and on muscle soreness/burn. Educated on new exercises this session and on updated HEP.    Person(s) Educated  Patient    Methods  Explanation;Handout;Demonstration    Comprehension  Verbalized understanding;Returned demonstration       PT Short Term Goals - 11/09/17 1845      PT SHORT TERM GOAL #1   Title  Patient will be independent with HEP, updated PRN, to improve functional LE strength and lumbar mobility as well as reduce radicular symptoms.    Time  2    Period  Weeks    Status  New    Target Date  11/23/17      PT SHORT TERM GOAL #2   Title  Patient will perform SLS for 15 seconds on bil LE to indicate improved balance for gait and stair mobility.    Time  3    Period  Weeks    Status  New    Target Date  11/30/17      PT SHORT TERM GOAL #3   Title  Patient will improve MMT for limited groups by 1/2  grade to demonstrate significant increase in LE strength to have greater muscle strength/endurance.    Time  3    Period  Weeks    Status  New        PT Long Term Goals - 11/09/17 1845      PT LONG TERM GOAL #1   Title  Patient will improve MMT for limited groups by 1 grade to demonstrate significant increase in LE strength to have greater muscle strength/endurance.    Time  6    Period  Weeks    Status  New    Target Date  12/21/17      PT LONG TERM GOAL #2   Title  Patient will perform SLS for 30 seconds on bil LE to indicate improved balance for gait and stair mobility.    Time  6    Period  Weeks    Status  New      PT LONG TERM GOAL #3   Title  Patient will improve ODI score by 12 points to indicate significant decrease in self reported disability related to back pain and improved functional mobility and QOL.    Time  6    Period  Weeks    Status  New      PT LONG TERM GOAL #4   Title  Patient will report being able to attend an entire footbaal game for her son without experiencing her knee bucklig, or radiating pains into her LT LE beyond her knee to demonstrate centralization of pain.    Time  6    Period  Weeks    Status  New         Plan - 12/07/17 1403    Clinical Impression Statement  Patient continued with exercises this session focusing on core stabilization and postural correction. She demonstrated improved transvers abdominis activation with ab set and improve elevation with bridges this date. Sciatic nerve glide was introduced on Lt LE and Lateral shift correction was added today as well. Patient continues to require  frequent and repeated redirection to task/exercises in therapy to progress throughout session. She will continue to benefit from skilled PT interventions to address impairments and progress towards goals as well as reduce pain to improve QOL.    Rehab Potential  Fair    PT Frequency  2x / week    PT Duration  6 weeks    PT  Treatment/Interventions  ADLs/Self Care Home Management;Electrical Stimulation;Cryotherapy;Moist Heat;Functional mobility training;Therapeutic activities;Therapeutic exercise;Balance training;Neuromuscular re-education;Patient/family education;Manual techniques;Passive range of motion;Energy conservation;Taping    PT Next Visit Plan  Continue with lumbar stabilization exercises. Continue to progress to standing postural exercises and lateral shift correction.     PT Home Exercise Plan  ab set, leg press, lateral shift correction, money    Consulted and Agree with Plan of Care  Patient       Patient will benefit from skilled therapeutic intervention in order to improve the following deficits and impairments:  Abnormal gait, Pain, Improper body mechanics, Postural dysfunction, Decreased mobility, Decreased activity tolerance, Decreased endurance, Decreased strength, Obesity, Impaired flexibility, Difficulty walking, Decreased balance  Visit Diagnosis: Chronic bilateral low back pain with left-sided sciatica  Muscle weakness (generalized)  Difficulty in walking, not elsewhere classified     Problem List Patient Active Problem List   Diagnosis Date Noted  . OCD (obsessive compulsive disorder) 09/01/2015  . GERD (gastroesophageal reflux disease) 03/24/2015  . Morbid obesity ([place]) 03/24/2015  . Hyperglycemia 03/24/2015  . Hypertriglyceridemia 03/24/2015  . Obesity 01/07/2014  . 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 Generalized anxiety disorder 05/17/2012  . Reactive airway disease 05/12/2012  . Psoriasis 05/12/2012    Kipp Brood, PT, DPT Physical Therapist with [facility]  12/07/2017 2:12 PM    [place], [place], [address] Phone: [phone]   Fax:  [phone]  Name: Belinda [name] MRN: [id number] Date of Birth: [birth year]

## 2017-12-09 ENCOUNTER — Telehealth (HOSPITAL_COMMUNITY): Payer: Self-pay

## 2017-12-09 ENCOUNTER — Ambulatory Visit (HOSPITAL_COMMUNITY): Payer: Medicaid Other

## 2017-12-09 ENCOUNTER — Encounter (HOSPITAL_COMMUNITY): Payer: Self-pay

## 2017-12-09 DIAGNOSIS — M6281 Muscle weakness (generalized): Secondary | ICD-10-CM | POA: Diagnosis not present

## 2017-12-09 DIAGNOSIS — G8929 Other chronic pain: Secondary | ICD-10-CM | POA: Diagnosis not present

## 2017-12-09 DIAGNOSIS — R262 Difficulty in walking, not elsewhere classified: Secondary | ICD-10-CM | POA: Diagnosis not present

## 2017-12-09 DIAGNOSIS — M5442 Lumbago with sciatica, left side: Principal | ICD-10-CM

## 2017-12-09 NOTE — Patient Instructions (Signed)
Isometric Abdominal    Lying on back with knees bent, tighten stomach by pressing elbows down. Hold 5 seconds. Repeat 10-20 times per set. Do 2 sets per day. http://orth.exer.us/1087   Copyright  VHI. All rights reserved.   Leg Press: Double    Straighten BOTH legs, one at a time, down to floor. Bring toes AND forefeet toward knees, extend heels. Press BOTH legs down. DO NOT BEND KNEES. Hold 5 seconds. Relax legs. Re-bend knees one at a time to starting position. Repeat 10 times. Copyright  VHI. All rights reserved.   Scapular Retraction: Abduction (Standing)    With arms elevated and elbows bent to 90, pinch shoulder blades together and press arms back. Repeat 10-20 times per set. Do 1-2 sets per session.   http://orth.exer.us/951   Copyright  VHI. All rights reserved.   Bridge    Lie back, legs bent. Inhale, pressing hips up. Keeping ribs in, lengthen lower back. Exhale, rolling down along spine from top. Repeat 10-20 times. Do 1-2 sessions per day.  http://pm.exer.us/55   Copyright  VHI. All rights reserved.
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
## 2017-12-09 NOTE — Telephone Encounter (Signed)
Patient had some personal business to take care of.

## 2017-12-12 ENCOUNTER — Encounter (HOSPITAL_COMMUNITY): Payer: Self-pay | Admitting: Physical Therapy

## 2017-12-13 ENCOUNTER — Ambulatory Visit (INDEPENDENT_AMBULATORY_CARE_PROVIDER_SITE_OTHER): Payer: Medicaid Other | Admitting: Family Medicine

## 2017-12-13 ENCOUNTER — Encounter: Payer: Self-pay | Admitting: Family Medicine

## 2017-12-13 VITALS — BP 118/86 | Ht 61.0 in | Wt 223.8 lb

## 2017-12-13 DIAGNOSIS — F411 Generalized anxiety disorder: Secondary | ICD-10-CM

## 2017-12-13 DIAGNOSIS — L409 Psoriasis, unspecified: Secondary | ICD-10-CM | POA: Diagnosis not present

## 2017-12-13 MED ORDER — ALPRAZOLAM 1 MG PO TABS: 1.0000 mg | ORAL_TABLET | Freq: Four times a day (QID) | ORAL | 5 refills | Status: DC | PRN

## 2017-12-13 MED ORDER — NORGESTIM-ETH ESTRAD TRIPHASIC 0.18/0.215/0.25 MG-35 MCG PO TABS: 1.0000 | ORAL_TABLET | Freq: Every day | ORAL | 6 refills | Status: DC

## 2017-12-13 MED ORDER — FLUTICASONE PROPIONATE 50 MCG/ACT NA SUSP: 2.0000 | Freq: Every day | NASAL | 11 refills | Status: DC

## 2017-12-13 MED ORDER — PATADAY 0.2 % OP SOLN: 1.0000 [drp] | Freq: Every day | OPHTHALMIC | 5 refills | Status: DC

## 2017-12-13 MED ORDER — PENICILLIN V POTASSIUM 500 MG PO TABS: 500.0000 mg | ORAL_TABLET | Freq: Four times a day (QID) | ORAL | 0 refills | Status: DC

## 2017-12-13 NOTE — Progress Notes (Signed)
Subjective:    Patient ID: Belinda [name], female    DOB: 01-13-1981, 37 y.o.   MRN: [id number]  HPI Patient arrives for a follow up on on anxiety.  Patient relates she is taking her medication on a regular basis.  States it does help her.  She is not interested in being on any type of antidepressants In the past those of cause her side effects and problems She is requesting to see a counselor regarding her anxiety   Patient states she is having a flare of her eczema and needs refill on clobetasol.  She has had a flare of her eczema on the back of her neck back of her arm is causing a lot of itching clobetasol help she is requesting a refill     Patient also needs a refill of pataday and birth control.  She is having mild allergy symptoms and requesting eyedrops for this.  She denies any other problems with her allergies no flareup of asthma  She is interested in continuing birth control pill she understands she needs to see the gynecologist we will give her a few refills today she does not smoke  Patient also needs an antibiotic called in for an infected tooth.  Patient does have poor dentition she needs to see dentist in order to have tooth removed overall though she is hanging in there but she can   Review of Systems  Constitutional: Negative for activity change, appetite change and fatigue.  HENT: Negative for congestion and rhinorrhea.   Respiratory: Negative for cough and shortness of breath.   Cardiovascular: Negative for chest pain and leg swelling.  Gastrointestinal: Negative for abdominal pain and diarrhea.  Endocrine: Negative for polydipsia and polyphagia.  Skin: Negative for color change.  Neurological: Negative for dizziness and weakness.  Psychiatric/Behavioral: Negative for behavioral problems and confusion.       Objective:   Physical Exam  Constitutional: She appears well-nourished. No distress.  HENT:  Head: Normocephalic and atraumatic.  Eyes: Right eye  exhibits no discharge. Left eye exhibits no discharge.  Neck: No tracheal deviation present.  Cardiovascular: Normal rate, regular rhythm and normal heart sounds.  No murmur heard. Pulmonary/Chest: Effort normal and breath sounds normal. No respiratory distress.  Musculoskeletal: She exhibits no edema.  Lymphadenopathy:    She has no cervical adenopathy.  Neurological: She is alert. Coordination normal.  Skin: Skin is warm and dry.  Psychiatric: She has a normal mood and affect. Her behavior is normal.  Vitals reviewed.   Poor dentention  Mild psoriasis Mild eczema       Assessment & Plan:  Abscess tooth recommend penicillin recommend see dentist  Psoriasis continue topicals referral to dermatology for further consultation  Generalized anxiety referral for counseling patient does not want to be on any additional medicines refills of her medicine was given  Allergy eyedrops because of allergic conjunctivitis issues refills given  She was counseled regarding her weight she is trying to lose weight she is trying to watch her diet  Birth control refills given patient not smoking patient was encouraged to see her gynecologist within the next 3 months  25 minutes was spent with the patient.  This statement verifies that 25 minutes was indeed spent with the patient.  More than 50% of this visit-total duration of the visit-was spent in counseling and coordination of care. The issues that the patient came in for today as reflected in the diagnosis (s) please refer to documentation for further  details.

## 2017-12-14 ENCOUNTER — Ambulatory Visit (HOSPITAL_COMMUNITY): Payer: Medicaid Other | Admitting: Physical Therapy

## 2017-12-14 DIAGNOSIS — M5442 Lumbago with sciatica, left side: Principal | ICD-10-CM

## 2017-12-14 DIAGNOSIS — R262 Difficulty in walking, not elsewhere classified: Secondary | ICD-10-CM | POA: Diagnosis not present

## 2017-12-14 DIAGNOSIS — G8929 Other chronic pain: Secondary | ICD-10-CM | POA: Diagnosis not present

## 2017-12-14 DIAGNOSIS — M6281 Muscle weakness (generalized): Secondary | ICD-10-CM | POA: Diagnosis not present
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
## 2017-12-16 ENCOUNTER — Encounter (HOSPITAL_COMMUNITY): Payer: Self-pay

## 2017-12-16 ENCOUNTER — Ambulatory Visit (HOSPITAL_COMMUNITY): Payer: Medicaid Other

## 2017-12-16 DIAGNOSIS — R262 Difficulty in walking, not elsewhere classified: Secondary | ICD-10-CM

## 2017-12-16 DIAGNOSIS — M5442 Lumbago with sciatica, left side: Secondary | ICD-10-CM | POA: Diagnosis not present

## 2017-12-16 DIAGNOSIS — M6281 Muscle weakness (generalized): Secondary | ICD-10-CM

## 2017-12-16 DIAGNOSIS — G8929 Other chronic pain: Secondary | ICD-10-CM

## 2017-12-16 NOTE — Patient Instructions (Signed)
Bridge    Lie back, legs bent. Inhale, pressing hips up. Keeping ribs in, lengthen lower back. Exhale, rolling down along spine from top. Repeat 10 times. Do 1-2 sessions per day.  http://pm.exer.us/55   Copyright  VHI. All rights reserved.

## 2017-12-16 NOTE — Therapy (Signed)
[place], [place], [address] Phone: [phone]   Fax:  [phone]  Physical Therapy Treatment  Patient Details  Name: Belinda [name] MRN: [id number] Date of Birth: [birth year] Referring Provider (PT): Kathyrn Drown, MD   Encounter Date: 12/16/2017  PT End of Session - 12/16/17 1043    Visit Number  7    Number of Visits  12    Date for PT Re-Evaluation  12/21/17   Minireassess 11/30/17   Authorization Type  Medicaid    Authorization Time Period  11/09/17 - 12/23/17 (3 units approved from 11/12/17-11/25/17) (8 units approved 12/05/17-01/01/18)    Authorization - Visit Number  3    Authorization - Number of Visits  8    PT Start Time  5638    PT Stop Time  1117    PT Time Calculation (min)  40 min    Activity Tolerance  Patient tolerated treatment well;Patient limited by pain;No increased pain    Behavior During Therapy  WFL for tasks assessed/performed       Past Medical History:  Diagnosis Date  . Anxiety   . Anxiety   . Asthma   . Bipolar affective ([place])   . Narcotic addiction ([place])   . Psoriasis   . Reactive airways dysfunction syndrome [facility])     Past Surgical History:  Procedure Laterality Date  . CESAREAN SECTION    . DILATION AND CURETTAGE OF UTERUS    . PID      There were no vitals filed for this visit.  Subjective Assessment - 12/16/17 1041    Subjective  Reports she was really sore following last session, was unable to make it to her son's football game. Current pain scale 8/10    Patient Stated Goals  walking more - like to go to the park and walk around park; footbal games and practices    Currently in Pain?  Yes    Pain Score  8     Pain Location  Back    Pain Orientation  Lower    Pain Descriptors / Indicators  Aching;Sore    Pain Type  Acute pain    Pain Onset  More than a month ago    Pain Frequency  Intermittent    Aggravating Factors   walking     Pain Relieving Factors   exercises    Effect of Pain on Daily Activities  severe                       [facility] Adult PT Treatment/Exercise - 12/16/17 0001      Exercises   Exercises  Lumbar      Lumbar Exercises: Standing   Heel Raises  10 reps    Heel Raises Limitations  toe raises 10x    Functional Squats  10 reps    Functional Squats Limitations  3D hip excursion    Forward Lunge  10 reps    Forward Lunge Limitations  on 4in step, cueing for mechanics    Scapular Retraction  Strengthening;Both;10 reps    Theraband Level (Scapular Retraction)  Level 2 (Red)    Scapular Retraction Limitations  HEP    Row  10 reps;Theraband    Theraband Level (Row)  Level 2 (Red)    Row Limitations  HEP    Shoulder Extension  10 reps;Theraband    Theraband Level (Shoulder Extension)  Level 2 (Red)  Shoulder Extension Limitations  HEP    Other Standing Lumbar Exercises  step ups 4in step 10x; increased pain Lt LE wiht WB    Other Standing Lumbar Exercises  rockerboard 2 min lateral               PT Short Term Goals - 12/14/17 1359      PT SHORT TERM GOAL #1   Title  Patient will be independent with HEP, updated PRN, to improve functional LE strength and lumbar mobility as well as reduce radicular symptoms.    Time  2    Period  Weeks    Status  On-going      PT SHORT TERM GOAL #2   Title  Patient will perform SLS for 15 seconds on bil LE to indicate improved balance for gait and stair mobility.    Time  3    Period  Weeks    Status  On-going      PT SHORT TERM GOAL #3   Title  Patient will improve MMT for limited groups by 1/2 grade to demonstrate significant increase in LE strength to have greater muscle strength/endurance.    Time  3    Period  Weeks    Status  On-going        PT Long Term Goals - 12/14/17 1359      PT LONG TERM GOAL #1   Title  Patient will improve MMT for limited groups by 1 grade to demonstrate significant increase in LE strength to have greater muscle  strength/endurance.    Time  6    Period  Weeks    Status  On-going      PT LONG TERM GOAL #2   Title  Patient will perform SLS for 30 seconds on bil LE to indicate improved balance for gait and stair mobility.    Time  6    Period  Weeks    Status  On-going      PT LONG TERM GOAL #3   Title  Patient will improve ODI score by 12 points to indicate significant decrease in self reported disability related to back pain and improved functional mobility and QOL.    Time  6    Period  Weeks    Status  On-going      PT LONG TERM GOAL #4   Title  Patient will report being able to attend an entire footbaal game for her son without experiencing her knee bucklig, or radiating pains into her LT LE beyond her knee to demonstrate centralization of pain.    Time  6    Period  Weeks    Status  On-going            Plan - 12/16/17 1220    Clinical Impression Statement  Added lunges, rockerboard and step ups for functional strenghtening and lumbar stability.  Pt continues to require cueing to equalize stance phase during gait as well as during standing.      Rehab Potential  Fair    PT Frequency  2x / week    PT Duration  6 weeks    PT Treatment/Interventions  ADLs/Self Care Home Management;Electrical Stimulation;Cryotherapy;Moist Heat;Functional mobility training;Therapeutic activities;Therapeutic exercise;Balance training;Neuromuscular re-education;Patient/family education;Manual techniques;Passive range of motion;Energy conservation;Taping    PT Next Visit Plan  Continue with lumbar stabilization exercises.  Continue rockerboard, lunges and step ups to program.  Add DF/PF on rockerboard next session.      PT Home Exercise Plan  ab set, leg press, lateral shift correction, money; 12/16/17:  theraband postural strengthening.         Patient will benefit from skilled therapeutic intervention in order to improve the following deficits and impairments:  Abnormal gait, Pain, Improper body  mechanics, Postural dysfunction, Decreased mobility, Decreased activity tolerance, Decreased endurance, Decreased strength, Obesity, Impaired flexibility, Difficulty walking, Decreased balance  Visit Diagnosis: Chronic bilateral low back pain with left-sided sciatica  Muscle weakness (generalized)  Difficulty in walking, not elsewhere classified     Problem List Patient Active Problem List   Diagnosis Date Noted  . OCD (obsessive compulsive disorder) 09/01/2015  . GERD (gastroesophageal reflux disease) 03/24/2015  . Morbid obesity ([place]) 03/24/2015  . Hyperglycemia 03/24/2015  . Hypertriglyceridemia 03/24/2015  . Obesity 01/07/2014  . 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 Generalized anxiety disorder 05/17/2012  . Reactive airway disease 05/12/2012  . Psoriasis 05/12/2012   Ihor Austin, [place]; [place]  Aldona Lento 12/16/2017, 12:23 PM  [place] [address] [place], [place], [address] Phone: [phone]   Fax:  [phone]  Name: SELENIA MIHOK MRN: [id number] Date of Birth: 01-04-1981

## 2017-12-19 ENCOUNTER — Other Ambulatory Visit: Payer: Self-pay | Admitting: Family Medicine

## 2017-12-20 ENCOUNTER — Other Ambulatory Visit: Payer: Self-pay

## 2017-12-20 ENCOUNTER — Encounter (HOSPITAL_COMMUNITY): Payer: Self-pay | Admitting: Physical Therapy

## 2017-12-20 ENCOUNTER — Ambulatory Visit (HOSPITAL_COMMUNITY): Payer: Medicaid Other | Admitting: Physical Therapy

## 2017-12-20 DIAGNOSIS — R262 Difficulty in walking, not elsewhere classified: Secondary | ICD-10-CM

## 2017-12-20 DIAGNOSIS — G8929 Other chronic pain: Secondary | ICD-10-CM

## 2017-12-20 DIAGNOSIS — M6281 Muscle weakness (generalized): Secondary | ICD-10-CM | POA: Diagnosis not present

## 2017-12-20 DIAGNOSIS — M5442 Lumbago with sciatica, left side: Principal | ICD-10-CM
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
## 2017-12-21 ENCOUNTER — Encounter: Payer: Self-pay | Admitting: Family Medicine

## 2017-12-22 ENCOUNTER — Encounter (HOSPITAL_COMMUNITY): Payer: Self-pay

## 2017-12-23 ENCOUNTER — Telehealth: Payer: Self-pay | Admitting: Family Medicine

## 2017-12-23 ENCOUNTER — Telehealth (HOSPITAL_COMMUNITY): Payer: Self-pay | Admitting: Family Medicine

## 2017-12-23 ENCOUNTER — Other Ambulatory Visit: Payer: Self-pay

## 2017-12-23 ENCOUNTER — Ambulatory Visit (HOSPITAL_COMMUNITY): Payer: Medicaid Other

## 2017-12-23 MED ORDER — CYCLOBENZAPRINE HCL 10 MG PO TABS: 10.0000 mg | ORAL_TABLET | Freq: Three times a day (TID) | ORAL | 0 refills | Status: DC | PRN

## 2017-12-23 NOTE — Telephone Encounter (Signed)
Low grade fever started last Friday. Tooth is hurting so she thinks it may be from an infection in tooth. Taking pencillin now. Just started it. Pt wants something for cough. Sinus pressure. Hurting under eyes. Headache. Nausea. Using flonase.   Ran out of muscle relaxer. Needs refill.  Constipation. Tried miralax and it does not work. Last bm last week and it was small hard balls. Making her back hurt.   [facility] [address].

## 2017-12-23 NOTE — Telephone Encounter (Signed)
Left message to return call to get more info

## 2017-12-23 NOTE — Telephone Encounter (Signed)
Patient is aware 

## 2017-12-23 NOTE — Telephone Encounter (Signed)
12/23/17  is runing a fever and I suggested that she not come in

## 2017-12-23 NOTE — Telephone Encounter (Signed)
Patient is aware of all.

## 2017-12-23 NOTE — Telephone Encounter (Signed)
Left message to return call 

## 2017-12-23 NOTE — Telephone Encounter (Signed)
Flexeril sent in. Left message to return call

## 2017-12-23 NOTE — Telephone Encounter (Signed)
As for the constipation issues I recommend Colace 1 tablet daily Continue MiraLAX 1 capful in 8 ounces of water daily May use Dulcolax tablets as needed If ongoing trouble follow-up

## 2017-12-23 NOTE — Telephone Encounter (Signed)
Patient requesting refill on flexeril 10 mg lasted filled 12/02/17 also she states constipation going on and low grade fever. [facility] [address]

## 2017-12-23 NOTE — Telephone Encounter (Signed)
May have refill of her Flexeril 21 tablets 1 3 times daily as needed caution drowsiness OTC measures for cough, such as Robitussin-DM-it is not a perfect medicine but it will help take the edge off Follow-up if ongoing troubles

## 2017-12-27 ENCOUNTER — Ambulatory Visit (HOSPITAL_COMMUNITY): Payer: Medicaid Other | Attending: Family Medicine

## 2017-12-27 ENCOUNTER — Telehealth (HOSPITAL_COMMUNITY): Payer: Self-pay

## 2017-12-27 ENCOUNTER — Other Ambulatory Visit: Payer: Self-pay

## 2017-12-27 ENCOUNTER — Encounter (HOSPITAL_COMMUNITY): Payer: Self-pay

## 2017-12-27 DIAGNOSIS — M5442 Lumbago with sciatica, left side: Secondary | ICD-10-CM | POA: Insufficient documentation

## 2017-12-27 DIAGNOSIS — R262 Difficulty in walking, not elsewhere classified: Secondary | ICD-10-CM | POA: Insufficient documentation

## 2017-12-27 DIAGNOSIS — G8929 Other chronic pain: Secondary | ICD-10-CM | POA: Diagnosis not present

## 2017-12-27 DIAGNOSIS — M6281 Muscle weakness (generalized): Secondary | ICD-10-CM | POA: Diagnosis not present

## 2017-12-27 NOTE — Telephone Encounter (Signed)
Pt called to confrim apptment -Pt must be with RQ today per pt's request. NF 12/27/17
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
## 2017-12-28 ENCOUNTER — Encounter (HOSPITAL_COMMUNITY): Payer: Self-pay

## 2017-12-28 ENCOUNTER — Ambulatory Visit (HOSPITAL_COMMUNITY): Payer: Medicaid Other

## 2017-12-28 DIAGNOSIS — G8929 Other chronic pain: Secondary | ICD-10-CM | POA: Diagnosis not present

## 2017-12-28 DIAGNOSIS — R262 Difficulty in walking, not elsewhere classified: Secondary | ICD-10-CM | POA: Diagnosis not present

## 2017-12-28 DIAGNOSIS — M6281 Muscle weakness (generalized): Secondary | ICD-10-CM

## 2017-12-28 DIAGNOSIS — M5442 Lumbago with sciatica, left side: Secondary | ICD-10-CM | POA: Diagnosis not present
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
## 2017-12-30 ENCOUNTER — Encounter

## 2018-01-25 ENCOUNTER — Other Ambulatory Visit: Payer: Medicaid Other | Admitting: Adult Health

## 2018-02-10 ENCOUNTER — Other Ambulatory Visit: Payer: Self-pay | Admitting: Family Medicine

## 2018-02-10 ENCOUNTER — Telehealth: Payer: Self-pay | Admitting: Family Medicine

## 2018-02-10 MED ORDER — FLUCONAZOLE 150 MG PO TABS: ORAL_TABLET | ORAL | 0 refills | Status: DC

## 2018-02-10 NOTE — Telephone Encounter (Signed)
Pt states son was seen at [facility] and was diagnosed with a virus, and pt states she has been running a fever 102-103 when it first started now 100-101, pt states she's taking Advil and tylenol.  Not eating much, anything she eats makes her sick, she states everything she eats taste like mucus. Pt states she's had a headache since 02/04/2018.  Advise.
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
## 2018-02-10 NOTE — Telephone Encounter (Signed)
Medication sent in. Left message on voicemail.

## 2018-02-13 ENCOUNTER — Ambulatory Visit: Payer: Medicaid Other | Admitting: Family Medicine

## 2018-02-14 ENCOUNTER — Encounter: Payer: Self-pay | Admitting: Family Medicine

## 2018-02-14 ENCOUNTER — Ambulatory Visit (INDEPENDENT_AMBULATORY_CARE_PROVIDER_SITE_OTHER): Payer: Medicaid Other | Admitting: Family Medicine

## 2018-02-14 VITALS — Temp 98.5°F | Ht 61.0 in | Wt 220.0 lb

## 2018-02-14 DIAGNOSIS — J329 Chronic sinusitis, unspecified: Secondary | ICD-10-CM

## 2018-02-14 DIAGNOSIS — J310 Chronic rhinitis: Secondary | ICD-10-CM

## 2018-02-14 MED ORDER — PREDNISONE 10 MG PO TABS: ORAL_TABLET | ORAL | 0 refills | Status: DC

## 2018-02-14 MED ORDER — AZITHROMYCIN 250 MG PO TABS: ORAL_TABLET | ORAL | 0 refills | Status: DC

## 2018-02-14 NOTE — Progress Notes (Signed)
   Subjective:    Patient ID: Belinda [name], female    DOB: Oct 02, 1980, 37 y.o.   MRN: [id number]  HPI Patient is here today with complaints of a sinus infection She has a cough,wheezing,runny and stuffy nose.Right ear pain, headache,sore throat,fever,ongoing since 02/04/2018. She has been using an inhlaer,tylenolmadvil,mucinex,flonase.  Sig headache, pos frontal  Worse with cough,   Cough productive  Dim energy   Had flu like symtoms from the start   appewtite diminished   Does not take flu shot       Review of Systems No headache, no major weight loss or weight gain, no chest pain no back pain abdominal pain no change in bowel habits complete ROS otherwise negative     Objective:   Physical Exam  Alert, mild malaise. Hydration good Vitals stable. frontal/ maxillary tenderness evident positive nasal congestion. pharynx normal neck supple  lungs clear/no crackles very faint wheezes. heart regular in rhythm      Assessment & Plan:  Impression rhinosinusitis / likely post flu.  likely post viral, discussed with patient. plan antibiotics prescribed. Questions answered. Symptomatic care discussed. warning signs discussed. WSL Also will cover with abx and a steroid taper modified

## 2018-03-02 ENCOUNTER — Other Ambulatory Visit: Payer: Medicaid Other | Admitting: Adult Health

## 2018-03-16 ENCOUNTER — Encounter: Payer: Self-pay | Admitting: Adult Health

## 2018-03-16 ENCOUNTER — Ambulatory Visit: Payer: Medicaid Other | Admitting: Adult Health

## 2018-03-16 ENCOUNTER — Other Ambulatory Visit (HOSPITAL_COMMUNITY)
Admission: RE | Admit: 2018-03-16 | Discharge: 2018-03-16 | Disposition: A | Discharge status: Home / Self Care | Payer: Medicaid Other | Source: Ambulatory Visit | Attending: Adult Health | Admitting: Adult Health

## 2018-03-16 ENCOUNTER — Encounter (INDEPENDENT_AMBULATORY_CARE_PROVIDER_SITE_OTHER): Payer: Self-pay

## 2018-03-16 VITALS — BP 109/73 | HR 85 | Ht 62.0 in | Wt 223.0 lb

## 2018-03-16 DIAGNOSIS — Z0000 Encounter for general adult medical examination without abnormal findings: Secondary | ICD-10-CM

## 2018-03-16 DIAGNOSIS — Z01419 Encounter for gynecological examination (general) (routine) without abnormal findings: Secondary | ICD-10-CM | POA: Insufficient documentation

## 2018-03-16 DIAGNOSIS — Z30011 Encounter for initial prescription of contraceptive pills: Secondary | ICD-10-CM | POA: Diagnosis not present

## 2018-03-16 DIAGNOSIS — Z8742 Personal history of other diseases of the female genital tract: Secondary | ICD-10-CM

## 2018-03-16 DIAGNOSIS — R635 Abnormal weight gain: Secondary | ICD-10-CM

## 2018-03-16 DIAGNOSIS — F17200 Nicotine dependence, unspecified, uncomplicated: Secondary | ICD-10-CM

## 2018-03-16 DIAGNOSIS — Z113 Encounter for screening for infections with a predominantly sexual mode of transmission: Secondary | ICD-10-CM

## 2018-03-16 MED ORDER — NORETHINDRONE 0.35 MG PO TABS: 1.0000 | ORAL_TABLET | Freq: Every day | ORAL | 11 refills | Status: DC

## 2018-03-16 NOTE — Patient Instructions (Signed)

## 2018-03-16 NOTE — Progress Notes (Addendum)
Patient ID: Belinda [name], female   DOB: 12-Jan-1981, 38 y.o.   MRN: [id number] History of Present Illness: Belinda [name] is a 38 year old white female, separated, in for well woman gyn exam and pap. Had abnormal pap at health dept and had colpo.  PCP is [facility].    Current Medications, Allergies, Past Medical History, Past Surgical History, Family History and Social History were reviewed in [employer] record.     Review of Systems: Patient denies any headaches, hearing loss, fatigue, blurred vision, shortness of breath, chest pain, abdominal pain, problems with bowel movements, urination, or intercourse. No joint pain or mood swings. Weight gain.  Feels itchy.     Physical Exam:BP 109/73 (BP Location: Left Arm, Patient Position: Sitting, Cuff Size: Large)   Pulse 85   Ht 5' 2"  (1.575 m)   Wt 223 lb (101.2 kg)   LMP 02/25/2018   BMI 40.79 kg/m  General:  Well developed, well nourished, no acute distress Skin:  Warm and dry Neck:  Midline trachea, normal thyroid, good ROM, no lymphadenopathy Lungs; Clear to auscultation bilaterally Breast:  No dominant palpable mass, retraction, or nipple discharge,mole right breast Cardiovascular: Regular rate and rhythm Abdomen:  Soft, non tender, no hepatosplenomegaly Pelvic:  External genitalia is normal in appearance, no lesions.  The vagina is normal in appearance. Urethra has no lesions or masses. The cervix is smooth, pap with HPV with GC/CHL performed.  Uterus is felt to be normal size, shape, and contour.  No adnexal masses or tenderness noted.Bladder is non tender, no masses felt. Extremities/musculoskeletal:  No swelling or varicosities noted, no clubbing or cyanosis Psych:  No mood changes, alert and cooperative,seems happy Fall risk is low. PHQ 9 score 12, she is on xanax, denies being suicidal.  Examination chaperoned by Belinda [name], CMA. Wants to lose weight, will check TSH.  She wants adipex, will wait til  labs back.Start now to decrease portions.  Pt informed can not smoke and take COCs after 35, will stop COC and start Micronor, and use condoms. She is a talker.  Impression:  1. Encounter for gynecological examination with Papanicolaou smear of cervix   2. Encounter for initial prescription of contraceptive pills   3. Smoker   4. History of abnormal cervical Pap smear   5. Screening examination for STD (sexually transmitted disease)   6. Weight gain      Plan: Check HIV and RPR, and TSH F/U in 3 months  Pap with HPV and GC/CHL sent Physical in 1 year Pap in 3 if normal Mammogram at 41  Meds ordered this encounter  Medications  . norethindrone (MICRONOR,CAMILA,ERRIN) 0.35 MG tablet    Sig: Take 1 tablet (0.35 mg total) by mouth daily.    Dispense:  1 Package    Refill:  11    Order Specific Question:   Supervising Provider    Answer:   Florian Buff [[id number]]  Use condoms Review calorie counting for weight loss  Try to stop smoking

## 2018-03-16 NOTE — Addendum Note (Signed)
Addended by: Diona Fanti A on: 03/16/2018 03:19 PM   Modules accepted: Orders

## 2018-03-21 LAB — CYTOLOGY - PAP
Adequacy: ABSENT
Chlamydia: NEGATIVE
Diagnosis: NEGATIVE
HPV: NOT DETECTED
Neisseria Gonorrhea: NEGATIVE

## 2018-03-22 ENCOUNTER — Telehealth: Payer: Self-pay | Admitting: Adult Health

## 2018-03-22 NOTE — Telephone Encounter (Signed)
Patient called stating that she would like to know the results of her blood work.  Please contact pt

## 2018-03-23 NOTE — Telephone Encounter (Signed)
Called patient back, No answer no option for voicemail.

## 2018-03-23 NOTE — Telephone Encounter (Signed)
Patient returned Amanda's call, she stated she had a migraine and had turned her phone off and she forgot.  Please call back.  [phone]

## 2018-03-23 NOTE — Telephone Encounter (Signed)
Called patient x3 no answer, or voicemail.

## 2018-03-23 NOTE — Telephone Encounter (Signed)
Called patient back and phone just rings, no option for voicemail.

## 2018-03-24 ENCOUNTER — Telehealth: Payer: Self-pay | Admitting: Adult Health

## 2018-03-24 ENCOUNTER — Telehealth: Payer: Self-pay | Admitting: Family Medicine

## 2018-03-24 NOTE — Telephone Encounter (Signed)
Bland diet, avoid dairy products, continue OTC measures, we can thoroughly evaluate when the patient follows up for an office visit next week

## 2018-03-24 NOTE — Telephone Encounter (Signed)
I called and left a message to r/c. 

## 2018-03-24 NOTE — Telephone Encounter (Signed)
Patient called stating that she would like to know the results of her pap, Pt would also like for Anderson Malta to give her a referral to see a physiatry that would talk to her not place her on medication's. Pt states that she has been placed on many medication that has given her the adverse affect. Please contact pt

## 2018-03-24 NOTE — Telephone Encounter (Signed)
Left message @ 12:10 pm. JSY

## 2018-03-24 NOTE — Telephone Encounter (Signed)
Pt states that she feels it may be the start of  Pancreatitis or UC attack. Sharp shooting pain under breast area.   Pt has looked these up. Pt states she has had ulcer for a while. On Prilosec. Taking about 5 a day. 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 Taking 4 a day at most sometimes takes 5. Chewable alka seltzer generic 4 a day. Does well. Only way they do good is if pt lets them dissolves. Bad episode this weekend. Ate and stomach bloated; looks 6 month pregnant. Vomited and pressure relieved. Pt states when stomach bloats it puts pressure on ribs. Cant come today due to a previous appt in [place] that can not be missed. Gas X pills; swallows about 3 and chews up one. Outside in pain. Can see stomach swell. Bile or gas leaking out and getting to somewhere its not suppose to be. In 04 golf ball sized hole in stomach. Drinks soda to burp. Every other day if not every day. Going on for about 6 months. Spoke with provider due to patient not able to come in and having appt today in [place]; provider states that patient should do bland diet, avoid dairy and fried food, soft vegetables, and we will she her next week. Can patient take Prilosec 20 mg four times a day to equal 80 mg. Would Nexium do better? Would provider recommend anything else to get patient through the weekend? Please advise.

## 2018-03-24 NOTE — Telephone Encounter (Signed)
Pt having issue with stomach bloating everyday, patient states feels like pancreatitis attacks. Transferred to speak with [facility] nurse.

## 2018-03-27 ENCOUNTER — Encounter: Payer: Self-pay | Admitting: Family Medicine

## 2018-03-27 ENCOUNTER — Encounter: Payer: Self-pay | Admitting: Adult Health

## 2018-03-27 ENCOUNTER — Ambulatory Visit (INDEPENDENT_AMBULATORY_CARE_PROVIDER_SITE_OTHER): Payer: Medicaid Other | Admitting: Family Medicine

## 2018-03-27 VITALS — BP 128/82 | Ht 62.0 in | Wt 221.0 lb

## 2018-03-27 DIAGNOSIS — K219 Gastro-esophageal reflux disease without esophagitis: Secondary | ICD-10-CM

## 2018-03-27 DIAGNOSIS — K2900 Acute gastritis without bleeding: Secondary | ICD-10-CM

## 2018-03-27 MED ORDER — PANTOPRAZOLE SODIUM 40 MG PO TBEC: 40.0000 mg | DELAYED_RELEASE_TABLET | Freq: Every day | ORAL | 4 refills | Status: DC

## 2018-03-27 MED ORDER — SUCRALFATE 1 G PO TABS: ORAL_TABLET | ORAL | 0 refills | Status: DC

## 2018-03-27 NOTE — Telephone Encounter (Signed)
Patient has appointment this pm with Dr Nicki Reaper
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
## 2018-03-27 NOTE — Telephone Encounter (Signed)
[name] will call pt and schedule appt. Belinda [name]

## 2018-03-27 NOTE — Telephone Encounter (Signed)
Also, pt also wants to see a psychiatrist. She would rather see a man. One that she can talk to and not just get prescribed meds. Please advise. Thanks!! Belinda [name]

## 2018-03-27 NOTE — Telephone Encounter (Signed)
Spoke with pt letting her know pap was normal. Pt states she started new birth control pill last Sunday. Pt started bleeding Saturday. I advised that can be normal when you start a new birth control. Pt also wonders if you will start her on Phentermine. She plans on getting labs done today.

## 2018-03-28 ENCOUNTER — Telehealth: Payer: Self-pay | Admitting: Family Medicine

## 2018-03-28 NOTE — Telephone Encounter (Signed)
Left urgent message on pt's voicemail to notify of ultrasound appointment  Appointment:  Friday 03/31/2018, arrive 10:15am to [facility] with nothing to eat or drink after midnight

## 2018-03-30 ENCOUNTER — Ambulatory Visit: Payer: Medicaid Other | Admitting: Adult Health

## 2018-03-31 ENCOUNTER — Ambulatory Visit (HOSPITAL_COMMUNITY)
Admission: RE | Admit: 2018-03-31 | Discharge: 2018-03-31 | Disposition: A | Discharge status: Home / Self Care | Payer: Medicaid Other | Source: Ambulatory Visit | Attending: Family Medicine | Admitting: Family Medicine

## 2018-03-31 DIAGNOSIS — K8020 Calculus of gallbladder without cholecystitis without obstruction: Secondary | ICD-10-CM | POA: Diagnosis not present

## 2018-03-31 DIAGNOSIS — Z113 Encounter for screening for infections with a predominantly sexual mode of transmission: Secondary | ICD-10-CM | POA: Diagnosis not present

## 2018-03-31 DIAGNOSIS — K219 Gastro-esophageal reflux disease without esophagitis: Secondary | ICD-10-CM | POA: Insufficient documentation

## 2018-03-31 DIAGNOSIS — K2900 Acute gastritis without bleeding: Secondary | ICD-10-CM | POA: Insufficient documentation

## 2018-03-31 DIAGNOSIS — R635 Abnormal weight gain: Secondary | ICD-10-CM | POA: Diagnosis not present

## 2018-04-01 LAB — TSH: TSH: 4.6 u[IU]/mL — ABNORMAL HIGH (ref 0.450–4.500)

## 2018-04-01 LAB — HIV ANTIBODY (ROUTINE TESTING W REFLEX): HIV Screen 4th Generation wRfx: NONREACTIVE

## 2018-04-01 LAB — RPR: RPR Ser Ql: NONREACTIVE

## 2018-04-03 ENCOUNTER — Telehealth: Payer: Self-pay | Admitting: Family Medicine

## 2018-04-03 ENCOUNTER — Other Ambulatory Visit: Payer: Self-pay | Admitting: Family Medicine

## 2018-04-03 DIAGNOSIS — R109 Unspecified abdominal pain: Secondary | ICD-10-CM

## 2018-04-03 LAB — CBC WITH DIFFERENTIAL/PLATELET
Basophils Absolute: 0 10*3/uL (ref 0.0–0.2)
Basos: 0 %
EOS (ABSOLUTE): 0.1 10*3/uL (ref 0.0–0.4)
Eos: 2 %
Hematocrit: 39.8 % (ref 34.0–46.6)
Hemoglobin: 13.9 g/dL (ref 11.1–15.9)
Immature Grans (Abs): 0 10*3/uL (ref 0.0–0.1)
Immature Granulocytes: 0 %
Lymphocytes Absolute: 3.7 10*3/uL — ABNORMAL HIGH (ref 0.7–3.1)
Lymphs: 52 %
MCH: 31.2 pg (ref 26.6–33.0)
MCHC: 34.9 g/dL (ref 31.5–35.7)
MCV: 89 fL (ref 79–97)
Monocytes Absolute: 0.5 10*3/uL (ref 0.1–0.9)
Monocytes: 7 %
Neutrophils Absolute: 2.7 10*3/uL (ref 1.4–7.0)
Neutrophils: 39 %
Platelets: 264 10*3/uL (ref 150–450)
RBC: 4.45 x10E6/uL (ref 3.77–5.28)
RDW: 12.7 % (ref 11.7–15.4)
WBC: 7 10*3/uL (ref 3.4–10.8)

## 2018-04-03 LAB — HEPATIC FUNCTION PANEL
ALT: 31 IU/L (ref 0–32)
AST: 26 IU/L (ref 0–40)
Albumin: 4.5 g/dL (ref 3.8–4.8)
Alkaline Phosphatase: 70 IU/L (ref 39–117)
Bilirubin Total: 0.4 mg/dL (ref 0.0–1.2)
Bilirubin, Direct: 0.12 mg/dL (ref 0.00–0.40)
Total Protein: 7.2 g/dL (ref 6.0–8.5)

## 2018-04-03 LAB — LIPASE: Lipase: 25 U/L (ref 14–72)

## 2018-04-03 LAB — BASIC METABOLIC PANEL
BUN/Creatinine Ratio: 13 (ref 9–23)
BUN: 12 mg/dL (ref 6–20)
CO2: 23 mmol/L (ref 20–29)
Calcium: 9.4 mg/dL (ref 8.7–10.2)
Chloride: 102 mmol/L (ref 96–106)
Creatinine, Ser: 0.91 mg/dL (ref 0.57–1.00)
GFR calc Af Amer: 94 mL/min/{1.73_m2} (ref >59)
GFR calc non Af Amer: 81 mL/min/{1.73_m2} (ref >59)
Glucose: 93 mg/dL (ref 65–99)
Potassium: 4.2 mmol/L (ref 3.5–5.2)
Sodium: 141 mmol/L (ref 134–144)

## 2018-04-03 LAB — SEDIMENTATION RATE: Sed Rate: 26 mm/hr (ref 0–32)

## 2018-04-03 LAB — H. PYLORI ANTIBODY, IGG: H. pylori, IgG AbS: 0.16 Index Value (ref 0.00–0.79)

## 2018-04-03 NOTE — Telephone Encounter (Signed)
Wanting results of Labs and U/S from last week.  Also has a question about medication. Has taken pepto bismal over the weekend and wants to know if it is ok to take Protonix and Carafate now?

## 2018-04-03 NOTE — Telephone Encounter (Signed)
Contacted patient and informed pt of results. Pt verbalized understanding. Pt states she is going to take Protonix tomorrow and will start Carafate.

## 2018-04-05 ENCOUNTER — Telehealth: Payer: Self-pay | Admitting: Adult Health

## 2018-04-05 NOTE — Telephone Encounter (Signed)
Pt aware that HIV and RPR negative and TSH slightly elevated at 4.600, will repeat next week

## 2018-04-06 ENCOUNTER — Telehealth: Payer: Self-pay | Admitting: Adult Health

## 2018-04-06 NOTE — Telephone Encounter (Signed)
Pt has decided that she is going to just use condoms right now and thinks she needs to give her body a break from hormones right now. She will talk to you at a later date about when she needs to start back. She is going to stop the pill now I advised her that she needed to use condoms from this point on if she stops in  the middle of a pack and to expect break through bleeding.

## 2018-04-06 NOTE — Telephone Encounter (Signed)
So noted pt off OCs her choice

## 2018-04-11 ENCOUNTER — Ambulatory Visit: Payer: Medicaid Other | Admitting: Family Medicine

## 2018-04-12 ENCOUNTER — Ambulatory Visit: Payer: Medicaid Other | Admitting: Adult Health

## 2018-04-13 ENCOUNTER — Ambulatory Visit (HOSPITAL_COMMUNITY)
Admission: RE | Admit: 2018-04-13 | Discharge: 2018-04-13 | Disposition: A | Discharge status: Home / Self Care | Payer: Medicaid Other | Source: Ambulatory Visit | Attending: Family Medicine | Admitting: Family Medicine

## 2018-04-13 ENCOUNTER — Ambulatory Visit: Payer: Medicaid Other | Admitting: Adult Health

## 2018-04-13 DIAGNOSIS — R109 Unspecified abdominal pain: Secondary | ICD-10-CM | POA: Insufficient documentation

## 2018-04-13 DIAGNOSIS — R1011 Right upper quadrant pain: Secondary | ICD-10-CM | POA: Diagnosis not present

## 2018-04-13 MED ORDER — TECHNETIUM TC 99M MEBROFENIN IV KIT
5.0000 | PACK | Freq: Once | INTRAVENOUS | Status: AC | PRN
  Administered 2018-04-13: 5.22 via INTRAVENOUS

## 2018-04-14 ENCOUNTER — Other Ambulatory Visit: Payer: Self-pay | Admitting: Family Medicine

## 2018-04-14 ENCOUNTER — Ambulatory Visit: Payer: Medicaid Other | Admitting: Adult Health

## 2018-04-14 ENCOUNTER — Telehealth: Payer: Self-pay | Admitting: *Deleted

## 2018-04-14 DIAGNOSIS — R109 Unspecified abdominal pain: Secondary | ICD-10-CM

## 2018-04-14 NOTE — Telephone Encounter (Signed)
LMOVM that pt did not have to have bloodwork done before her appt on 04/24/18

## 2018-04-17 ENCOUNTER — Encounter: Payer: Self-pay | Admitting: Family Medicine

## 2018-04-24 ENCOUNTER — Other Ambulatory Visit: Payer: Self-pay

## 2018-04-24 ENCOUNTER — Ambulatory Visit (INDEPENDENT_AMBULATORY_CARE_PROVIDER_SITE_OTHER): Payer: Medicaid Other | Admitting: Adult Health

## 2018-04-24 ENCOUNTER — Encounter: Payer: Self-pay | Admitting: Internal Medicine

## 2018-04-24 ENCOUNTER — Encounter: Payer: Self-pay | Admitting: Adult Health

## 2018-04-24 VITALS — BP 139/81 | HR 111 | Ht 62.0 in | Wt 224.0 lb

## 2018-04-24 DIAGNOSIS — R7989 Other specified abnormal findings of blood chemistry: Secondary | ICD-10-CM | POA: Diagnosis not present

## 2018-04-24 DIAGNOSIS — Z713 Dietary counseling and surveillance: Secondary | ICD-10-CM | POA: Insufficient documentation

## 2018-04-24 DIAGNOSIS — Z6841 Body Mass Index (BMI) 40.0 and over, adult: Secondary | ICD-10-CM | POA: Insufficient documentation

## 2018-04-24 NOTE — Progress Notes (Signed)
Patient ID: Belinda [name], female   DOB: 16-Oct-1980, 38 y.o.   MRN: [id number] History of Present Illness: Belinda [name] is a 38 year old white female, engaged, in to discuss thyroid. Will recheck since slightly elevated.    Current Medications, Allergies, Past Medical History, Past Surgical History, Family History and Social History were reviewed in [employer] record.     Review of Systems: Weight gain, can't lose weight Spotting since stopped Micronor    Physical Exam:BP 139/81 (BP Location: Right Arm, Patient Position: Sitting, Cuff Size: Normal)   Pulse (!) 111   Ht 5' 2"  (1.575 m)   Wt 224 lb (101.6 kg)   BMI 40.97 kg/m  General:  Well developed, well nourished, no acute distress Skin:  Warm and dry Lungs; Clear to auscultation bilaterally Cardiovascular: Regular rate and rhythm Psych:  No mood changes, alert and cooperative,seems happy,but is talking fast today.[facility] Will recheck TSH and free T4.   Impression: 1. Elevated TSH   2. Weight loss counseling, encounter for   3. Body mass index 40.0-44.9, adult (HCC)       Plan: Check TSH and Free T4 Will follow up in 2 weeks, recheck BP and discuss  Review handouts on DASH and nonalcoholic fatty liver disease diet

## 2018-04-24 NOTE — Patient Instructions (Signed)
Nonalcoholic Fatty Liver Disease Diet Nonalcoholic fatty liver disease is a condition that causes fat to accumulate in and around the liver. The disease makes it harder for the liver to work the way that it should. Following a healthy diet can help to keep nonalcoholic fatty liver disease under control. It can also help to prevent or improve conditions that are associated with the disease, such as heart disease, diabetes, high blood pressure, and abnormal cholesterol levels. Along with regular exercise, this diet:  Promotes weight loss.  Helps to control blood sugar levels.  Helps to improve the way that the body uses insulin. What do I need to know about this diet?  Use the glycemic index (GI) to plan your meals. The index tells you how quickly a food will raise your blood sugar. Choose low-GI foods. These foods take a longer time to raise blood sugar.  Keep track of how many calories you take in. Eating the right amount of calories will help you to achieve a healthy weight.  You may want to follow a Mediterranean diet. This diet includes a lot of vegetables, lean meats or fish, whole grains, fruits, and healthy oils and fats. What foods can I eat? Grains Whole grains, such as whole-wheat or whole-grain breads, crackers, tortillas, cereals, and pasta. Stone-ground whole wheat. Pumpernickel bread. Unsweetened oatmeal. Bulgur. Barley. Quinoa. Brown or wild rice. Corn or whole-wheat flour tortillas. Vegetables Lettuce. Spinach. Peas. Beets. Cauliflower. Cabbage. Broccoli. Carrots. Tomatoes. Squash. Eggplant. Herbs. Peppers. Onions. Cucumbers. Brussels sprouts. Yams and sweet potatoes. Beans. Lentils. Fruits Bananas. Apples. Oranges. Grapes. Papaya. Mango. Pomegranate. Kiwi. Grapefruit. Cherries. Meats and Other Protein Sources Seafood and shellfish. Lean meats. Poultry. Tofu. Dairy Low-fat or fat-free dairy products, such as yogurt, cottage cheese, and cheese. Beverages Water. Sugar-free  drinks. Tea. Coffee. Low-fat or skim milk. Milk alternatives, such as soy or almond milk. Real fruit juice. Condiments Mustard. Relish. Low-fat, low-sugar ketchup and barbecue sauce. Low-fat or fat-free mayonnaise. Sweets and Desserts Sugar-free sweets. Fats and Oils Avocado. Canola or olive oil. Nuts and nut butters. Seeds. The items listed above may not be a complete list of recommended foods or beverages. Contact your dietitian for more options. What foods are not recommended? Palm oil and coconut oil. Processed foods. Fried foods. Sweetened drinks, such as sweet tea, milkshakes, snow cones, iced sweet drinks, and sodas. Alcohol. Sweets. Foods that contain a lot of salt or sodium. The items listed above may not be a complete list of foods and beverages to avoid. Contact your dietitian for more information. This information is not intended to replace advice given to you by your health care provider. Make sure you discuss any questions you have with your health care provider. Document Released: 06/25/2014 Document Revised: 07/17/2015 Document Reviewed: 03/05/2014 Elsevier Interactive Patient Education  2019 [place] DASH stands for "Dietary Approaches to Stop Hypertension." The DASH eating plan is a healthy eating plan that has been shown to reduce high blood pressure (hypertension). It may also reduce your risk for type 2 diabetes, heart disease, and stroke. The DASH eating plan may also help with weight loss. What are tips for following this plan?  General guidelines  Avoid eating more than 2,300 mg (milligrams) of salt (sodium) a day. If you have hypertension, you may need to reduce your sodium intake to 1,500 mg a day.  Limit alcohol intake to no more than 1 drink a day for nonpregnant women and 2 drinks a day for men. One drink  equals 12 oz of beer, 5 oz of wine, or 1 oz of hard liquor.  Work with your health care provider to maintain a healthy body weight or to  lose weight. Ask what an ideal weight is for you.  Get at least 30 minutes of exercise that causes your heart to beat faster (aerobic exercise) most days of the week. Activities may include walking, swimming, or biking.  Work with your health care provider or diet and nutrition specialist (dietitian) to adjust your eating plan to your individual calorie needs. Reading food labels   Check food labels for the amount of sodium per serving. Choose foods with less than 5 percent of the Daily Value of sodium. Generally, foods with less than 300 mg of sodium per serving fit into this eating plan.  To find whole grains, look for the word "whole" as the first word in the ingredient list. Shopping  Buy products labeled as "low-sodium" or "no salt added."  Buy fresh foods. Avoid canned foods and premade or frozen meals. Cooking  Avoid adding salt when cooking. Use salt-free seasonings or herbs instead of table salt or sea salt. Check with your health care provider or pharmacist before using salt substitutes.  Do not fry foods. Cook foods using healthy methods such as baking, boiling, grilling, and broiling instead.  Cook with heart-healthy oils, such as olive, canola, soybean, or sunflower oil. Meal planning  Eat a balanced diet that includes: ? 5 or more servings of fruits and vegetables each day. At each meal, try to fill half of your plate with fruits and vegetables. ? Up to 6-8 servings of whole grains each day. ? Less than 6 oz of lean meat, poultry, or fish each day. A 3-oz serving of meat is about the same size as a deck of cards. One egg equals 1 oz. ? 2 servings of low-fat dairy each day. ? A serving of nuts, seeds, or beans 5 times each week. ? Heart-healthy fats. Healthy fats called Omega-3 fatty acids are found in foods such as flaxseeds and coldwater fish, like sardines, salmon, and mackerel.  Limit how much you eat of the following: ? Canned or prepackaged foods. ? Food that is  high in trans fat, such as fried foods. ? Food that is high in saturated fat, such as fatty meat. ? Sweets, desserts, sugary drinks, and other foods with added sugar. ? Full-fat dairy products.  Do not salt foods before eating.  Try to eat at least 2 vegetarian meals each week.  Eat more home-cooked food and less restaurant, buffet, and fast food.  When eating at a restaurant, ask that your food be prepared with less salt or no salt, if possible. What foods are recommended? The items listed may not be a complete list. Talk with your dietitian about what dietary choices are best for you. Grains Whole-grain or whole-wheat bread. Whole-grain or whole-wheat pasta. Brown rice. [place]. Bulgur. Whole-grain and low-sodium cereals. Pita bread. Low-fat, low-sodium crackers. Whole-wheat flour tortillas. Vegetables Fresh or frozen vegetables (raw, steamed, roasted, or grilled). Low-sodium or reduced-sodium tomato and vegetable juice. Low-sodium or reduced-sodium tomato sauce and tomato paste. Low-sodium or reduced-sodium canned vegetables. Fruits All fresh, dried, or frozen fruit. Canned fruit in natural juice (without added sugar). Meat and other protein foods Skinless chicken or [place]. Ground chicken or [place]. Pork with fat trimmed off. Fish and seafood. Egg whites. Dried beans, peas, or lentils. Unsalted nuts, nut butters, and seeds. Unsalted canned beans. Lean cuts  of beef with fat trimmed off. Low-sodium, lean deli meat. Dairy Low-fat (1%) or fat-free (skim) milk. Fat-free, low-fat, or reduced-fat cheeses. Nonfat, low-sodium ricotta or cottage cheese. Low-fat or nonfat yogurt. Low-fat, low-sodium cheese. Fats and oils Soft margarine without trans fats. Vegetable oil. Low-fat, reduced-fat, or light mayonnaise and salad dressings (reduced-sodium). Canola, safflower, olive, soybean, and sunflower oils. Avocado. Seasoning and other foods Herbs. Spices. Seasoning mixes without salt.  Unsalted popcorn and pretzels. Fat-free sweets. What foods are not recommended? The items listed may not be a complete list. Talk with your dietitian about what dietary choices are best for you. Grains Baked goods made with fat, such as croissants, muffins, or some breads. Dry pasta or rice meal packs. Vegetables Creamed or fried vegetables. Vegetables in a cheese sauce. Regular canned vegetables (not low-sodium or reduced-sodium). Regular canned tomato sauce and paste (not low-sodium or reduced-sodium). Regular tomato and vegetable juice (not low-sodium or reduced-sodium). Angie Fava. Olives. Fruits Canned fruit in a light or heavy syrup. Fried fruit. Fruit in cream or butter sauce. Meat and other protein foods Fatty cuts of meat. Ribs. Fried meat. Berniece Salines. Sausage. Bologna and other processed lunch meats. Salami. Fatback. Hotdogs. Bratwurst. Salted nuts and seeds. Canned beans with added salt. Canned or smoked fish. Whole eggs or egg yolks. Chicken or [place] with skin. Dairy Whole or 2% milk, cream, and half-and-half. Whole or full-fat cream cheese. Whole-fat or sweetened yogurt. Full-fat cheese. Nondairy creamers. Whipped toppings. Processed cheese and cheese spreads. Fats and oils Butter. Stick margarine. Lard. Shortening. Ghee. Bacon fat. Tropical oils, such as coconut, palm kernel, or palm oil. Seasoning and other foods Salted popcorn and pretzels. Onion salt, garlic salt, seasoned salt, table salt, and sea salt. Worcestershire sauce. Tartar sauce. Barbecue sauce. Teriyaki sauce. Soy sauce, including reduced-sodium. Steak sauce. Canned and packaged gravies. Fish sauce. Oyster sauce. Cocktail sauce. Horseradish that you find on the shelf. Ketchup. Mustard. Meat flavorings and tenderizers. Bouillon cubes. Hot sauce and Tabasco sauce. Premade or packaged marinades. Premade or packaged taco seasonings. Relishes. Regular salad dressings. Where to find more information:  National Heart, Lung, and [place]: https://wilson-eaton.com/  American Heart Association: www.heart.org Summary  The DASH eating plan is a healthy eating plan that has been shown to reduce high blood pressure (hypertension). It may also reduce your risk for type 2 diabetes, heart disease, and stroke.  With the DASH eating plan, you should limit salt (sodium) intake to 2,300 mg a day. If you have hypertension, you may need to reduce your sodium intake to 1,500 mg a day.  When on the DASH eating plan, aim to eat more fresh fruits and vegetables, whole grains, lean proteins, low-fat dairy, and heart-healthy fats.  Work with your health care provider or diet and nutrition specialist (dietitian) to adjust your eating plan to your individual calorie needs. This information is not intended to replace advice given to you by your health care provider. Make sure you discuss any questions you have with your health care provider. Document Released: 01/28/2011 Document Revised: 02/02/2016 Document Reviewed: 02/02/2016 Elsevier Interactive Patient Education  2019 [employer].

## 2018-04-25 LAB — TSH: TSH: 3.37 u[IU]/mL (ref 0.450–4.500)

## 2018-04-25 LAB — T4, FREE: Free T4: 1.37 ng/dL (ref 0.82–1.77)

## 2018-04-28 ENCOUNTER — Telehealth: Payer: Self-pay | Admitting: Adult Health

## 2018-04-28 NOTE — Telephone Encounter (Signed)
Pt wanted to know what tsh level was. Informed patient of results.

## 2018-04-28 NOTE — Telephone Encounter (Signed)
Please call pt with test results

## 2018-05-01 ENCOUNTER — Telehealth: Payer: Self-pay | Admitting: Adult Health

## 2018-05-01 NOTE — Telephone Encounter (Signed)
Pt aware that labs normal

## 2018-05-01 NOTE — Telephone Encounter (Signed)
na

## 2018-05-08 ENCOUNTER — Ambulatory Visit: Payer: Self-pay | Admitting: Adult Health

## 2018-05-15 ENCOUNTER — Telehealth: Payer: Self-pay | Admitting: Family Medicine

## 2018-05-15 ENCOUNTER — Other Ambulatory Visit: Payer: Self-pay | Admitting: Family Medicine

## 2018-05-15 MED ORDER — ALPRAZOLAM 1 MG PO TABS: 1.0000 mg | ORAL_TABLET | Freq: Four times a day (QID) | ORAL | 1 refills | Status: DC | PRN

## 2018-05-15 NOTE — Telephone Encounter (Signed)
Patient would like to know if Dr.Scott recommends keeping appt on 05/17/18 for 5 month follow up for medications. Advise.   Requesting refill for: ALPRAZolam Duanne Moron) 1 MG tablet   Pharmacy:  [facility] [place], [place] - [address] AT [place]. [address]

## 2018-05-15 NOTE — Telephone Encounter (Signed)
I will send in a refill on her Xanax medicine I would recommend that she delay her follow-up office visit for a month  Certainly this will be affected somewhat by what is going on at that time  Certainly if she is having problems and wants to come on the 25th we can do so morning visit

## 2018-05-15 NOTE — Telephone Encounter (Signed)
Left message to return call 

## 2018-05-16 NOTE — Telephone Encounter (Signed)
Pt returned call and verbalized understanding. Pt transferred up front to set up appt for follow up.

## 2018-05-17 ENCOUNTER — Ambulatory Visit: Payer: Medicaid Other | Admitting: Family Medicine

## 2018-05-19 ENCOUNTER — Other Ambulatory Visit: Payer: Self-pay | Admitting: Family Medicine

## 2018-05-19 ENCOUNTER — Telehealth: Payer: Self-pay | Admitting: Family Medicine

## 2018-05-19 ENCOUNTER — Telehealth: Payer: Self-pay

## 2018-05-19 MED ORDER — SUCRALFATE 1 G PO TABS: ORAL_TABLET | ORAL | 0 refills | Status: DC

## 2018-05-19 MED ORDER — CEFPROZIL 500 MG PO TABS: ORAL_TABLET | ORAL | 0 refills | Status: DC

## 2018-05-19 MED ORDER — ALBUTEROL SULFATE HFA 108 (90 BASE) MCG/ACT IN AERS: INHALATION_SPRAY | RESPIRATORY_TRACT | 0 refills | Status: DC

## 2018-05-19 NOTE — Telephone Encounter (Signed)
Medication sent in to the requested pharmacy.

## 2018-05-19 NOTE — Telephone Encounter (Signed)
Requesting refill for albuterol (PROVENTIL HFA;VENTOLIN HFA) 108 (90 Base) MCG/ACT inhaler    sucralfate (CARAFATE) 1 g tablet   Pharmacy:  [id number] - [place], [place]. [address]

## 2018-05-19 NOTE — Telephone Encounter (Signed)
Patient is complaining of a sinus drainage, cough,raspsy. Has a history of asthma,some wheezing. (we just sent in her ventolin to the pharmacy).  Has some shortness of breath,no fever no body aches, some sore throat at night.   Has sinus headache.

## 2018-05-19 NOTE — Telephone Encounter (Signed)
cefzil 500 bid ten d

## 2018-05-19 NOTE — Telephone Encounter (Signed)
Patient is aware. Of all. She states we will have to do a prior auth.

## 2018-05-19 NOTE — Telephone Encounter (Signed)
Verbal from Dr. Richardson Landry Medication sent to the requested pharmacy.

## 2018-05-21 NOTE — Telephone Encounter (Signed)
Refill each x6

## 2018-05-29 ENCOUNTER — Telehealth: Payer: Self-pay | Admitting: Family Medicine

## 2018-05-29 MED ORDER — LORATADINE 10 MG PO TABS: ORAL_TABLET | ORAL | 0 refills | Status: DC

## 2018-05-29 NOTE — Telephone Encounter (Signed)
May use Mucinex May also use loratadine 10 mg 1 daily for any allergies

## 2018-05-29 NOTE — Telephone Encounter (Signed)
Please advise. Thank you

## 2018-05-29 NOTE — Telephone Encounter (Signed)
Pt wants to know if she can take mucinex with the sinus infection she has going on. She doesn't want it to suppress her cough and turn it in to pneumonia. She was also told by the pharmacist to ask the doc to prescribe something that will help her not get sinus infections so often.

## 2018-05-29 NOTE — Telephone Encounter (Signed)
Medication sent in. Left message to return call

## 2018-05-30 NOTE — Telephone Encounter (Signed)
Patient notified

## 2018-06-14 ENCOUNTER — Telehealth: Payer: Self-pay | Admitting: *Deleted

## 2018-06-14 NOTE — Telephone Encounter (Signed)
Attempted to call patient regarding her appointment tomorrow to see if we can change to webex. LMOM that if she has a bp cuff and scale that we would call her at the time of her visit.

## 2018-06-15 ENCOUNTER — Ambulatory Visit: Payer: Self-pay | Admitting: Adult Health

## 2018-06-21 ENCOUNTER — Ambulatory Visit (INDEPENDENT_AMBULATORY_CARE_PROVIDER_SITE_OTHER): Payer: Medicaid Other | Admitting: Family Medicine

## 2018-06-21 ENCOUNTER — Other Ambulatory Visit: Payer: Self-pay

## 2018-06-21 DIAGNOSIS — L409 Psoriasis, unspecified: Secondary | ICD-10-CM | POA: Diagnosis not present

## 2018-06-21 DIAGNOSIS — J019 Acute sinusitis, unspecified: Secondary | ICD-10-CM | POA: Diagnosis not present

## 2018-06-21 DIAGNOSIS — K219 Gastro-esophageal reflux disease without esophagitis: Secondary | ICD-10-CM

## 2018-06-21 DIAGNOSIS — F411 Generalized anxiety disorder: Secondary | ICD-10-CM | POA: Diagnosis not present

## 2018-06-21 DIAGNOSIS — J4530 Mild persistent asthma, uncomplicated: Secondary | ICD-10-CM | POA: Diagnosis not present

## 2018-06-21 MED ORDER — FLUTICASONE PROPIONATE 50 MCG/ACT NA SUSP: 2.0000 | Freq: Every day | NASAL | 11 refills | Status: DC

## 2018-06-21 MED ORDER — OLOPATADINE HCL 0.7 % OP SOLN: 1.0000 [drp] | Freq: Every day | OPHTHALMIC | 5 refills | Status: DC | PRN

## 2018-06-21 MED ORDER — FLUTICASONE PROPIONATE HFA 110 MCG/ACT IN AERO: INHALATION_SPRAY | RESPIRATORY_TRACT | 12 refills | Status: DC

## 2018-06-21 MED ORDER — SUCRALFATE 1 G PO TABS: ORAL_TABLET | ORAL | 5 refills | Status: DC

## 2018-06-21 MED ORDER — CEFPROZIL 500 MG PO TABS: 500.0000 mg | ORAL_TABLET | Freq: Two times a day (BID) | ORAL | 0 refills | Status: DC

## 2018-06-21 MED ORDER — ALPRAZOLAM 1 MG PO TABS: 1.0000 mg | ORAL_TABLET | Freq: Four times a day (QID) | ORAL | 4 refills | Status: DC | PRN

## 2018-06-21 MED ORDER — ALBUTEROL SULFATE HFA 108 (90 BASE) MCG/ACT IN AERS: INHALATION_SPRAY | RESPIRATORY_TRACT | 0 refills | Status: DC

## 2018-06-21 MED ORDER — PANTOPRAZOLE SODIUM 40 MG PO TBEC: 40.0000 mg | DELAYED_RELEASE_TABLET | Freq: Every day | ORAL | 4 refills | Status: DC

## 2018-06-21 MED ORDER — CLOBETASOL PROPIONATE 0.05 % EX SOLN: CUTANEOUS | 6 refills | Status: DC

## 2018-06-21 NOTE — Progress Notes (Signed)
   Subjective:    Patient ID: Belinda [name], female    DOB: 03/19/1980, 38 y.o.   MRN: [id number]  HPI  Format- video  Patient present at home Provider present at office Consent for interaction obtained Coronavirus outbreak made virtual visit necessary   Patient calls for a follow up on anxiety.   Patient also having allergy/sinus sx. Patient would like a refill on her recent antibiotic.    Patient would like to get something to help her sleep  Patient would like a refill on inhaler, carafate, clobetasol, and flonase  Virtual Visit via Video Note  I connected with Belinda [name] on 06/21/18 at 10:00 AM EDT by a video enabled telemedicine application and verified that I am speaking with the correct person using two identifiers.   I discussed the limitations of evaluation and management by telemedicine and the availability of in person appointments. The patient expressed understanding and agreed to proceed.  History of Present Illness:    Observations/Objective:   Assessment and Plan:   Follow Up Instructions:    I discussed the assessment and treatment plan with the patient. The patient was provided an opportunity to ask questions and all were answered. The patient agreed with the plan and demonstrated an understanding of the instructions.   The patient was advised to call back or seek an in-person evaluation if the symptoms worsen or if the condition fails to improve as anticipated.  I provided 25 minutes of non-face-to-face time during this encounter.    Our discussion was greater than 25 minutes it was spent more so speaking about her allergies or anxiety the pluses and minuses of adding a steroid inhaler plus also controlling her reflux related symptoms.  Review of Systems     Objective:   Physical Exam        Assessment & Plan:  25 minutes was spent with the patient.  This statement verifies that 25 minutes was indeed spent with the patient.  More  than 50% of this visit-total duration of the visit-was spent in counseling and coordination of care. The issues that the patient came in for today as reflected in the diagnosis (s) please refer to documentation for further details.  Psoriasis under good control clobetasol continue this measure  Reflux patient states she has to take pantoprazole as well as sucralfate on a regular basis to keep her symptoms under control keep her symptoms minimized she denies any dysphagia.  Reactive airway not responding to just albuterol alone along with allergy issues along with sinus infection Recommend antibiotic Continue regular allergy medicine Flonase and Zyrtec Add prescription allergy eyedrops Add Flovent 110 1 puff twice daily  Morbid obesity watch diet try to keep weight in check  Patient to follow-up in early August no lab work currently

## 2018-06-28 ENCOUNTER — Ambulatory Visit (INDEPENDENT_AMBULATORY_CARE_PROVIDER_SITE_OTHER): Payer: Medicaid Other | Admitting: Gastroenterology

## 2018-06-28 ENCOUNTER — Other Ambulatory Visit: Payer: Self-pay

## 2018-06-28 ENCOUNTER — Encounter: Payer: Self-pay | Admitting: Gastroenterology

## 2018-06-28 DIAGNOSIS — R1013 Epigastric pain: Secondary | ICD-10-CM | POA: Diagnosis not present

## 2018-06-28 DIAGNOSIS — K5900 Constipation, unspecified: Secondary | ICD-10-CM

## 2018-06-28 DIAGNOSIS — K219 Gastro-esophageal reflux disease without esophagitis: Secondary | ICD-10-CM

## 2018-06-28 NOTE — Progress Notes (Signed)
Primary Care Physician:  Kathyrn Drown, MD Primary GI:  Garfield Cornea, MD   Patient Location: Home  Provider Location: [facility] office  Reason for Visit: Abdominal pain  Persons present on the virtual encounter, with roles: Patient, myself (provider), Charma Igo, [place] (updated meds and allergies)  Total time (minutes) spent on medical discussion: 60 minutes  Due to COVID-19, visit was conducted using [email or web address] method.  Visit was requested by patient.  Virtual Visit via [email or web address]  I connected with Belinda [name] on 06/28/18 at  1:30 PM EDT by [email or web address] and verified that I am speaking with the correct person using two identifiers.   I discussed the limitations, risks, security and privacy concerns of performing an evaluation and management service by telephone/video and the availability of in person appointments. I also discussed with the patient that there may be a patient responsible charge related to this service. The patient expressed understanding and agreed to proceed.   HPI:   Belinda [name] is a 38 y.o. female who presents for virtual visit regarding abdominal pain at the request of Dr. Wolfgang Phoenix.   Pain in substernal region all the time. Feels like full of poisonous gas, like needs to explode. Feels like chest and upper stomach distended. Has to rock back and forth, rub stomach.  Pain can be excruciating at times.  Associated with nausea.  1 or 2 episodes of vomiting in the middle the night.  Frequent regurgitation.  Some dysphagia to solid foods such as rice, hard to even wash it down.  Feels like most of this is related to the fact that her mouth feels extremely dry.  Sometimes the abdominal discomfort and chest discomfort are worse with meals.  She ate [employer] pizza Saturday evening and Sunday morning she had a terrible episode associated with diaphoresis.  She took two pantoprazole, 2 omeprazole, 2 over-the-counter as antacids before calm down.  She does feel like Carafate  seems to keep things in check somewhat as she felt like this episode occurred because she ran out of Carafate the other day.  Since she ran out of Carafate several days back she has been doubling up on her pantoprazole and adding omeprazole.  Also has constipation.  Can go up to 2 weeks without a bowel movement.  Generally she takes an over-the-counter laxative if she has not been in a week.  Previously tried MiraLAX but it "made my constipation worse".  No melena or rectal bleeding.  Chronically on PPI, previously omeprazole.  She states she was diagnosed with peptic ulcer disease in 2004.    She states her stomach is always acting up.  She is had to switch her birth control several times and currently not on anything.  Her last period was August 20.  She states she took a pregnancy test at home 10 days ago and it was negative.  She got really sick on Christmas eve.  She had the flu.  She was not able to get Tamiflu because she presented too late.  She reports that she has been on Z-Pak, Cefzil since then for upper respiratory/sinus issues.  She is back on Cefzil now for sinusitis.  She reports that she was up all night coughing and could not sleep.  No fever.  No shortness of breath.  It is notable that the patient talk nonstop for 60 minutes today on virtual video with no appearance of shortness of breath.  She has been taking Advil for headaches.  No more than 2 at a time last per day.  Nothing excessive in her report.  She stopped once her stomach started acting up a couple months ago.  She is also trying to watch her diet closely.  Avoiding spicy foods.  No onions. .  Back in February she had an abdominal ultrasound showing cholelithiasis but no evidence of acute cholecystitis.  Possible fatty liver.  HIDA scan normal.  H. pylori IgG negative.  LFTs, lipase normal.  CBC normal.    Current Outpatient Medications  Medication Sig Dispense Refill   albuterol (PROVENTIL HFA;VENTOLIN HFA) 108 (90  Base) MCG/ACT inhaler INHALE 2 PUFFS INTO THE LUNGS EVERY 6 HOURS AS NEEDED FOR WHEEZING 8.5 g 5   albuterol (PROVENTIL) (2.5 MG/3ML) 0.083% nebulizer solution Take 2.5 mg by nebulization every 6 (six) hours as needed for wheezing or shortness of breath.     ALPRAZolam (XANAX) 1 MG tablet Take 1 tablet (1 mg total) by mouth 4 (four) times daily as needed. 120 tablet 4   cefPROZIL (CEFZIL) 500 MG tablet Take 1 tablet (500 mg total) by mouth 2 (two) times daily. 20 tablet 0   clobetasol (TEMOVATE) 0.05 % external solution APPLY TO THE AFFECTED AREA TWICE DAILY AS NEEDED 50 mL 6   fluticasone (FLONASE) 50 MCG/ACT nasal spray Place 2 sprays into both nostrils daily. 16 g 11   fluticasone (FLOVENT HFA) 110 MCG/ACT inhaler 1 puffs twice daily for asthma 1 Inhaler 12   loratadine (CLARITIN) 10 MG tablet Take one tablet by mouth daily for allergies. 30 tablet 0   Olopatadine HCl (PAZEO) 0.7 % SOLN Apply 1 drop to eye daily as needed. 1 Bottle 5   pantoprazole (PROTONIX) 40 MG tablet Take 1 tablet (40 mg total) by mouth daily. 30 tablet 4   sucralfate (CARAFATE) 1 g tablet TAKE 1 TABLET BY MOUTH THREE TIMES DAILY 90 tablet 5   No current facility-administered medications for this visit.     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[facility])    Vaginal Pap smear, abnormal     Past Surgical History:  Procedure Laterality Date   CESAREAN SECTION     COLPOSCOPY     DILATION AND CURETTAGE OF UTERUS     PID      Family History  Problem Relation Age of Onset   Hypertension Father    Heart attack Father    Stroke Father    Heart disease Maternal Grandmother    Hypertension Brother    Breast cancer Maternal Aunt    Colon cancer Neg Hx     Social History   Socioeconomic History   Marital status: Legally Separated    Spouse  name: Not on file   Number of children: 2   Years of education: Not on file   Highest education level: Not on file  Occupational History   Not on file  Social Needs   Financial resource strain: Not on file   Food insecurity:    Worry: Not on file    Inability: Not on file   Transportation needs:    Medical: Not on file    Non-medical: Not on file  Tobacco Use   Smoking status: Current Some Day Smoker    Types: Cigarettes   Smokeless tobacco: Never Used  Tobacco comment: light smoker 5 cigs daily  Substance and Sexual Activity   Alcohol use: No   Drug use: No    Comment: Denies   Sexual activity: Yes    Birth control/protection: Pill  Lifestyle   Physical activity:    Days per week: Not on file    Minutes per session: Not on file   Stress: Not on file  Relationships   Social connections:    Talks on phone: Not on file    Gets together: Not on file    Attends religious service: Not on file    Active member of club or organization: Not on file    Attends meetings of clubs or organizations: Not on file    Relationship status: Not on file   Intimate partner violence:    Fear of current or ex partner: Not on file    Emotionally abused: Not on file    Physically abused: Not on file    Forced sexual activity: Not on file  Other Topics Concern   Not on file  Social History Narrative   Not on file      ROS:  General: Negative for anorexia, weight loss, fever, chills, positive fatigue, weakness.  States she has gained weight. Eyes: Negative for vision changes.  ENT: Negative for hoarseness, see HPI CV: Negative for chest pain, angina, palpitations, dyspnea on exertion, peripheral edema.  Respiratory: Negative for dyspnea at rest, dyspnea on exertion, cough, sputum, wheezing.  GI: See history of present illness. GU:  Negative for dysuria, hematuria, urinary incontinence, urinary frequency, nocturnal urination.  MS: Negative for joint pain, low back  pain.  Derm: Negative for rash or itching.  Neuro: Negative for weakness, abnormal sensation, seizure, frequent headaches, memory loss, confusion.  Psych: Negative for  depression, suicidal ideation, hallucinations.  Positive for anxiety Endo: Negative for unusual weight change.  Heme: Negative for bruising or bleeding. Allergy: Negative for rash or hives.   Observations/Objective:  Pleasant Caucasian female in no acute distress.  Anxious.  She talk 25 minutes with the CMA while trying to obtain her medication list.  She talked over 60 minutes with me, pressured speech, could barely get a word in.  Otherwise appropriate.  Lab Results  Component Value Date   CREATININE 0.91 03/31/2018   BUN 12 03/31/2018   NA 141 03/31/2018   K 4.2 03/31/2018   CL 102 03/31/2018   CO2 23 03/31/2018   Lab Results  Component Value Date   ALT 31 03/31/2018   AST 26 03/31/2018   ALKPHOS 70 03/31/2018   BILITOT 0.4 03/31/2018   Lab Results  Component Value Date   WBC 7.0 03/31/2018   HGB 13.9 03/31/2018   HCT 39.8 03/31/2018   MCV 89 03/31/2018   PLT 264 03/31/2018   Lab Results  Component Value Date   TSH 3.370 04/24/2018   Lab Results  Component Value Date   LIPASE 25 03/31/2018   Lab Results  Component Value Date   ESRSEDRATE 26 03/31/2018    Assessment and Plan: Pleasant 38 year old female with history of chronic GERD, reported remote peptic ulcer disease, presenting with several month history of discomfort in the substernal chest region/epigastrium worse with meals.  She is having refractory reflux/regurgitation, rare vomiting.  Known cholelithiasis.  Symptoms are somewhat atypical with regards to location of her discomfort for biliary disease but not excluded at this time.  She definitely has reflux type symptoms as well which are poorly controlled.  Recent NSAID  use. Cannot rule out gastritis/pud. She has constipation which is poorly managed.  At this time we need to manage her  constipation.  We will send in prescription for Linzess 290 mcg daily.  We will optimize her reflux medication increasing her pantoprazole to 40 mg twice daily before meals.  Encouraged her to hold off on omeprazole.  She may use Carafate up to 4 times daily.  Currently on antibiotics for sinusitis.  We will move towards an EGD with propofol in a couple of weeks.  I have discussed the risks, alternatives, benefits with regards to but not limited to the risk of reaction to medication, bleeding, infection, perforation and the patient is agreeable to proceed. Written consent to be obtained.  Based on findings, she may need to see surgery for consideration of cholecystectomy.  Follow Up Instructions:    I discussed the assessment and treatment plan with the patient. The patient was provided an opportunity to ask questions and all were answered. The patient agreed with the plan and demonstrated an understanding of the instructions. AVS mailed to patient's home address.   The patient was advised to call back or seek an in-person evaluation if the symptoms worsen or if the condition fails to improve as anticipated.  I provided 60 minutes of virtual face-to-face time during this encounter.   Neil Crouch, PA-C

## 2018-06-29 ENCOUNTER — Other Ambulatory Visit: Payer: Self-pay | Admitting: *Deleted

## 2018-06-29 ENCOUNTER — Telehealth: Payer: Self-pay | Admitting: *Deleted

## 2018-06-29 DIAGNOSIS — K5900 Constipation, unspecified: Secondary | ICD-10-CM | POA: Insufficient documentation

## 2018-06-29 DIAGNOSIS — R1013 Epigastric pain: Secondary | ICD-10-CM | POA: Insufficient documentation

## 2018-06-29 DIAGNOSIS — K219 Gastro-esophageal reflux disease without esophagitis: Secondary | ICD-10-CM

## 2018-06-29 MED ORDER — PANTOPRAZOLE SODIUM 40 MG PO TBEC: 40.0000 mg | DELAYED_RELEASE_TABLET | Freq: Two times a day (BID) | ORAL | 5 refills | Status: DC

## 2018-06-29 MED ORDER — LINACLOTIDE 290 MCG PO CAPS: 290.0000 ug | ORAL_CAPSULE | Freq: Every day | ORAL | 5 refills | Status: DC

## 2018-06-29 NOTE — Telephone Encounter (Signed)
Spoke with patient and is scheduled EGD for 6/11 at 11:00am (d/t wanting a morning appt). Patient aware will mail instructions. Will call back with pre-op appt.

## 2018-06-29 NOTE — Patient Instructions (Signed)
1. Increase pantoprazole to 40 mg twice daily, 30 minutes before breakfast and 30 minutes before your evening meal.  New prescription sent to [facility]. 2. Start Linzess 290 mcg once daily at least 30 minutes before breakfast for constipation.  Prescription sent to [facility]. 3. Can continue Carafate up to 4 times daily. 4. Do not use omeprazole while on twice daily pantoprazole. 5. You can still use over-the-counter antacids such as Tums, Rolaids, Pepcid as needed if the above regimen does not work. 6. Upper endoscopy as scheduled.  See separate instructions.   Gastroesophageal Reflux Disease, Adult Gastroesophageal reflux (GER) happens when acid from the stomach flows up into the tube that connects the mouth and the stomach (esophagus). Normally, food travels down the esophagus and stays in the stomach to be digested. However, when a person has GER, food and stomach acid sometimes move back up into the esophagus. If this becomes a more serious problem, the person may be diagnosed with a disease called gastroesophageal reflux disease (GERD). GERD occurs when the reflux:  Happens often.  Causes frequent or severe symptoms.  Causes problems such as damage to the esophagus. When stomach acid comes in contact with the esophagus, the acid may cause soreness (inflammation) in the esophagus. Over time, GERD may create small holes (ulcers) in the lining of the esophagus. What are the causes? This condition is caused by a problem with the muscle between the esophagus and the stomach (lower esophageal sphincter, or LES). Normally, the LES muscle closes after food passes through the esophagus to the stomach. When the LES is weakened or abnormal, it does not close properly, and that allows food and stomach acid to go back up into the esophagus. The LES can be weakened by certain dietary substances, medicines, and medical conditions, including:  Tobacco use.  Pregnancy.  Having a hiatal  hernia.  Alcohol use.  Certain foods and beverages, such as coffee, chocolate, onions, and peppermint. What increases the risk? You are more likely to develop this condition if you:  Have an increased body weight.  Have a connective tissue disorder.  Use NSAID medicines. What are the signs or symptoms? Symptoms of this condition include:  Heartburn.  Difficult or painful swallowing.  The feeling of having a lump in the throat.  Abitter taste in the mouth.  Bad breath.  Having a large amount of saliva.  Having an upset or bloated stomach.  Belching.  Chest pain. Different conditions can cause chest pain. Make sure you see your health care provider if you experience chest pain.  Shortness of breath or wheezing.  Ongoing (chronic) cough or a night-time cough.  Wearing away of tooth enamel.  Weight loss. How is this diagnosed? Your health care provider will take a medical history and perform a physical exam. To determine if you have mild or severe GERD, your health care provider may also monitor how you respond to treatment. You may also have tests, including:  A test to examine your stomach and esophagus with a small camera (endoscopy).  A test thatmeasures the acidity level in your esophagus.  A test thatmeasures how much pressure is on your esophagus.  A barium swallow or modified barium swallow test to show the shape, size, and functioning of your esophagus. How is this treated? The goal of treatment is to help relieve your symptoms and to prevent complications. Treatment for this condition may vary depending on how severe your symptoms are. Your health care provider may recommend:  Changes to  your diet.  Medicine.  Surgery. Follow these instructions at home: Eating and drinking   Follow a diet as recommended by your health care provider. This may involve avoiding foods and drinks such as: ? Coffee and tea (with or without caffeine). ? Drinks that  containalcohol. ? Energy drinks and sports drinks. ? Carbonated drinks or sodas. ? Chocolate and cocoa. ? Peppermint and mint flavorings. ? Garlic and onions. ? Horseradish. ? Spicy and acidic foods, including peppers, chili powder, curry powder, vinegar, hot sauces, and barbecue sauce. ? Citrus fruit juices and citrus fruits, such as oranges, lemons, and limes. ? Tomato-based foods, such as red sauce, chili, salsa, and pizza with red sauce. ? Fried and fatty foods, such as donuts, french fries, potato chips, and high-fat dressings. ? High-fat meats, such as hot dogs and fatty cuts of red and white meats, such as rib eye steak, sausage, ham, and bacon. ? High-fat dairy items, such as whole milk, butter, and cream cheese.  Eat small, frequent meals instead of large meals.  Avoid drinking large amounts of liquid with your meals.  Avoid eating meals during the 2-3 hours before bedtime.  Avoid lying down right after you eat.  Do not exercise right after you eat. Lifestyle   Do not use any products that contain nicotine or tobacco, such as cigarettes, e-cigarettes, and chewing tobacco. If you need help quitting, ask your health care provider.  Try to reduce your stress by using methods such as yoga or meditation. If you need help reducing stress, ask your health care provider.  If you are overweight, reduce your weight to an amount that is healthy for you. Ask your health care provider for guidance about a safe weight loss goal. General instructions  Pay attention to any changes in your symptoms.  Take over-the-counter and prescription medicines only as told by your health care provider. Do not take aspirin, ibuprofen, or other NSAIDs unless your health care provider told you to do so.  Wear loose-fitting clothing. Do not wear anything tight around your waist that causes pressure on your abdomen.  Raise (elevate) the head of your bed about 6 inches (15 cm).  Avoid bending over if  this makes your symptoms worse.  Keep all follow-up visits as told by your health care provider. This is important. Contact a health care provider if:  You have: ? New symptoms. ? Unexplained weight loss. ? Difficulty swallowing or it hurts to swallow. ? Wheezing or a persistent cough. ? A hoarse voice.  Your symptoms do not improve with treatment. Get help right away if you:  Have pain in your arms, neck, jaw, teeth, or back.  Feel sweaty, dizzy, or light-headed.  Have chest pain or shortness of breath.  Vomit and your vomit looks like blood or coffee grounds.  Faint.  Have stool that is bloody or black.  Cannot swallow, drink, or eat. Summary  Gastroesophageal reflux happens when acid from the stomach flows up into the esophagus. GERD is a disease in which the reflux happens often, causes frequent or severe symptoms, or causes problems such as damage to the esophagus.  Treatment for this condition may vary depending on how severe your symptoms are. Your health care provider may recommend diet and lifestyle changes, medicine, or surgery.  Contact a health care provider if you have new or worsening symptoms.  Take over-the-counter and prescription medicines only as told by your health care provider. Do not take aspirin, ibuprofen, or other NSAIDs unless  your health care provider told you to do so.  Keep all follow-up visits as told by your health care provider. This is important. This information is not intended to replace advice given to you by your health care provider. Make sure you discuss any questions you have with your health care provider. Document Released: 11/18/2004 Document Revised: 08/17/2017 Document Reviewed: 08/17/2017 Elsevier Interactive Patient Education  2019 [place] for Gastroesophageal Reflux Disease, Adult When you have gastroesophageal reflux disease (GERD), the foods you eat and your eating habits are very important. Choosing  the right foods can help ease the discomfort of GERD. Consider working with a diet and nutrition specialist (dietitian) to help you make healthy food choices. What general guidelines should I follow?  Eating plan  Choose healthy foods low in fat, such as fruits, vegetables, whole grains, low-fat dairy products, and lean meat, fish, and poultry.  Eat frequent, small meals instead of three large meals each day. Eat your meals slowly, in a relaxed setting. Avoid bending over or lying down until 2-3 hours after eating.  Limit high-fat foods such as fatty meats or fried foods.  Limit your intake of oils, butter, and shortening to less than 8 teaspoons each day.  Avoid the following: ? Foods that cause symptoms. These may be different for different people. Keep a food diary to keep track of foods that cause symptoms. ? Alcohol. ? Drinking large amounts of liquid with meals. ? Eating meals during the 2-3 hours before bed.  Cook foods using methods other than frying. This may include baking, grilling, or broiling. Lifestyle  Maintain a healthy weight. Ask your health care provider what weight is healthy for you. If you need to lose weight, work with your health care provider to do so safely.  Exercise for at least 30 minutes on 5 or more days each week, or as told by your health care provider.  Avoid wearing clothes that fit tightly around your waist and chest.  Do not use any products that contain nicotine or tobacco, such as cigarettes and e-cigarettes. If you need help quitting, ask your health care provider.  Sleep with the head of your bed raised. Use a wedge under the mattress or blocks under the bed frame to raise the head of the bed. What foods are not recommended? The items listed may not be a complete list. Talk with your dietitian about what dietary choices are best for you. Grains Pastries or quick breads with added fat. [place] toast. Vegetables Deep fried vegetables. [place]  fries. Any vegetables prepared with added fat. Any vegetables that cause symptoms. For some people this may include tomatoes and tomato products, chili peppers, onions and garlic, and horseradish. Fruits Any fruits prepared with added fat. Any fruits that cause symptoms. For some people this may include citrus fruits, such as oranges, grapefruit, pineapple, and lemons. Meats and other protein foods High-fat meats, such as fatty beef or pork, hot dogs, ribs, ham, sausage, salami and bacon. Fried meat or protein, including fried fish and fried chicken. Nuts and nut butters. Dairy Whole milk and chocolate milk. Sour cream. Cream. Ice cream. Cream cheese. Milk shakes. Beverages Coffee and tea, with or without caffeine. Carbonated beverages. Sodas. Energy drinks. Fruit juice made with acidic fruits (such as orange or grapefruit). Tomato juice. Alcoholic drinks. Fats and oils Butter. Margarine. Shortening. Ghee. Sweets and desserts Chocolate and cocoa. Donuts. Seasoning and other foods Pepper. Peppermint and spearmint. Any condiments, herbs, or seasonings that  cause symptoms. For some people, this may include curry, hot sauce, or vinegar-based salad dressings. Summary  When you have gastroesophageal reflux disease (GERD), food and lifestyle choices are very important to help ease the discomfort of GERD.  Eat frequent, small meals instead of three large meals each day. Eat your meals slowly, in a relaxed setting. Avoid bending over or lying down until 2-3 hours after eating.  Limit high-fat foods such as fatty meat or fried foods. This information is not intended to replace advice given to you by your health care provider. Make sure you discuss any questions you have with your health care provider. Document Released: 02/08/2005 Document Revised: 02/10/2016 Document Reviewed: 02/10/2016 Elsevier Interactive Patient Education  2019 [employer].

## 2018-07-03 NOTE — Telephone Encounter (Signed)
Pre-op appt mailed

## 2018-07-24 HISTORY — PX: ESOPHAGOGASTRODUODENOSCOPY: SHX1529

## 2018-07-26 NOTE — Patient Instructions (Signed)
Belinda [name]  07/26/2018     @PREFPERIOPPHARMACY @   Your procedure is scheduled on  08/03/2018   Report to [facility] at  24  A.M.  Call this number if you have problems the morning of surgery:  [phone]   Remember:  Follow the diet instructions given to you by Dr Roseanne Kaufman office.                     Take these medicines the morning of surgery with A SIP OF WATER  Xanax(if needed), claritin, protonix.    Do not wear jewelry, make-up or nail polish.  Do not wear lotions, powders, or perfumes, or deodorant.  Do not shave 48 hours prior to surgery.  Men may shave face and neck.  Do not bring valuables to the hospital.  [facility] is not responsible for any belongings or valuables.  Contacts, dentures or bridgework may not be worn into surgery.  Leave your suitcase in the car.  After surgery it may be brought to your room.  For patients admitted to the hospital, discharge time will be determined by your treatment team.  Patients discharged the day of surgery will not be allowed to drive home.   Name and phone number of your driver:   family Special instructions:  None  Please read over the following fact sheets that you were given. Anesthesia Post-op Instructions and Care and Recovery After Surgery       Upper Endoscopy, Adult, Care After This sheet gives you information about how to care for yourself after your procedure. Your health care provider may also give you more specific instructions. If you have problems or questions, contact your health care provider. What can I expect after the procedure? After the procedure, it is common to have:  A sore throat.  Mild stomach pain or discomfort.  Bloating.  Nausea. Follow these instructions at home:   Follow instructions from your health care provider about what to eat or drink after your procedure.  Return to your normal activities as told by your health care provider. Ask your health care provider  what activities are safe for you.  Take over-the-counter and prescription medicines only as told by your health care provider.  Do not drive for 24 hours if you were given a sedative during your procedure.  Keep all follow-up visits as told by your health care provider. This is important. Contact a health care provider if you have:  A sore throat that lasts longer than one day.  Trouble swallowing. Get help right away if:  You vomit blood or your vomit looks like coffee grounds.  You have: ? A fever. ? Bloody, black, or tarry stools. ? A severe sore throat or you cannot swallow. ? Difficulty breathing. ? Severe pain in your chest or abdomen. Summary  After the procedure, it is common to have a sore throat, mild stomach discomfort, bloating, and nausea.  Do not drive for 24 hours if you were given a sedative during the procedure.  Follow instructions from your health care provider about what to eat or drink after your procedure.  Return to your normal activities as told by your health care provider. This information is not intended to replace advice given to you by your health care provider. Make sure you discuss any questions you have with your health care provider. Document Released: 08/10/2011 Document Revised: 07/11/2017 Document Reviewed: 07/11/2017 Elsevier Interactive Patient Education  2019  [place] After These instructions provide you with information about caring for yourself after your procedure. Your health care provider may also give you more specific instructions. Your treatment has been planned according to current medical practices, but problems sometimes occur. Call your health care provider if you have any problems or questions after your procedure. What can I expect after the procedure? After your procedure, you may:  Feel sleepy for several hours.  Feel clumsy and have poor balance for several hours.  Feel forgetful  about what happened after the procedure.  Have poor judgment for several hours.  Feel nauseous or vomit.  Have a sore throat if you had a breathing tube during the procedure. Follow these instructions at home: For at least 24 hours after the procedure:      Have a responsible adult stay with you. It is important to have someone help care for you until you are awake and alert.  Rest as needed.  Do not: ? Participate in activities in which you could fall or become injured. ? Drive. ? Use heavy machinery. ? Drink alcohol. ? Take sleeping pills or medicines that cause drowsiness. ? Make important decisions or sign legal documents. ? Take care of children on your own. Eating and drinking  Follow the diet that is recommended by your health care provider.  If you vomit, drink water, juice, or soup when you can drink without vomiting.  Make sure you have little or no nausea before eating solid foods. General instructions  Take over-the-counter and prescription medicines only as told by your health care provider.  If you have sleep apnea, surgery and certain medicines can increase your risk for breathing problems. Follow instructions from your health care provider about wearing your sleep device: ? Anytime you are sleeping, including during daytime naps. ? While taking prescription pain medicines, sleeping medicines, or medicines that make you drowsy.  If you smoke, do not smoke without supervision.  Keep all follow-up visits as told by your health care provider. This is important. Contact a health care provider if:  You keep feeling nauseous or you keep vomiting.  You feel light-headed.  You develop a rash.  You have a fever. Get help right away if:  You have trouble breathing. Summary  For several hours after your procedure, you may feel sleepy and have poor judgment.  Have a responsible adult stay with you for at least 24 hours or until you are awake and alert.  This information is not intended to replace advice given to you by your health care provider. Make sure you discuss any questions you have with your health care provider. Document Released: 06/01/2015 Document Revised: 09/24/2016 Document Reviewed: 06/01/2015 Elsevier Interactive Patient Education  2019 [employer].

## 2018-07-31 ENCOUNTER — Encounter (HOSPITAL_COMMUNITY)
Admission: RE | Admit: 2018-07-31 | Discharge: 2018-07-31 | Disposition: A | Discharge status: Home / Self Care | Payer: Medicaid Other | Source: Ambulatory Visit | Attending: Internal Medicine | Admitting: Internal Medicine

## 2018-07-31 ENCOUNTER — Other Ambulatory Visit (HOSPITAL_COMMUNITY)
Admission: RE | Admit: 2018-07-31 | Discharge: 2018-07-31 | Disposition: A | Discharge status: Home / Self Care | Payer: Medicaid Other | Source: Ambulatory Visit | Attending: Internal Medicine | Admitting: Internal Medicine

## 2018-07-31 ENCOUNTER — Other Ambulatory Visit: Payer: Self-pay

## 2018-07-31 DIAGNOSIS — Z01812 Encounter for preprocedural laboratory examination: Secondary | ICD-10-CM | POA: Diagnosis not present

## 2018-07-31 DIAGNOSIS — Z1159 Encounter for screening for other viral diseases: Secondary | ICD-10-CM | POA: Diagnosis not present

## 2018-07-31 LAB — HCG, SERUM, QUALITATIVE: Preg, Serum: NEGATIVE

## 2018-08-01 LAB — NOVEL CORONAVIRUS, NAA (HOSP ORDER, SEND-OUT TO REF LAB; TAT 18-24 HRS): SARS-CoV-2, NAA: NOT DETECTED

## 2018-08-02 ENCOUNTER — Telehealth: Payer: Self-pay | Admitting: Family Medicine

## 2018-08-02 NOTE — Telephone Encounter (Signed)
negative

## 2018-08-02 NOTE — Telephone Encounter (Signed)
Pt aware dr Nicki Reaper is out of the office til Monday but wanted him to look at her records when he returned. States she wanted him to call her Monday but advised her she would need appointment. She states she will call back for appointment to talk with dr Nicki Reaper but still wanted him to review her records. Having procedure done tomorrow with dr Sydell Axon.

## 2018-08-02 NOTE — Telephone Encounter (Signed)
Had COVID test and wants [place] to give her the results, doesn't want to wait for other doctor to tell her.

## 2018-08-02 NOTE — Telephone Encounter (Signed)
Pt.notified

## 2018-08-03 ENCOUNTER — Encounter (HOSPITAL_COMMUNITY): Payer: Self-pay

## 2018-08-03 ENCOUNTER — Ambulatory Visit (HOSPITAL_COMMUNITY)
Admission: RE | Admit: 2018-08-03 | Discharge: 2018-08-03 | Disposition: A | Discharge status: Home / Self Care | Payer: Medicaid Other | Attending: Internal Medicine | Admitting: Internal Medicine

## 2018-08-03 ENCOUNTER — Encounter (HOSPITAL_COMMUNITY)
Admission: RE | Disposition: A | Discharge status: Home / Self Care | Payer: Self-pay | Source: Home / Self Care | Attending: Internal Medicine

## 2018-08-03 ENCOUNTER — Ambulatory Visit (HOSPITAL_COMMUNITY): Payer: Medicaid Other | Admitting: Anesthesiology

## 2018-08-03 ENCOUNTER — Other Ambulatory Visit: Payer: Self-pay

## 2018-08-03 DIAGNOSIS — R1013 Epigastric pain: Secondary | ICD-10-CM | POA: Diagnosis not present

## 2018-08-03 DIAGNOSIS — J45909 Unspecified asthma, uncomplicated: Secondary | ICD-10-CM | POA: Diagnosis not present

## 2018-08-03 DIAGNOSIS — Z7951 Long term (current) use of inhaled steroids: Secondary | ICD-10-CM | POA: Insufficient documentation

## 2018-08-03 DIAGNOSIS — F419 Anxiety disorder, unspecified: Secondary | ICD-10-CM | POA: Diagnosis not present

## 2018-08-03 DIAGNOSIS — K219 Gastro-esophageal reflux disease without esophagitis: Secondary | ICD-10-CM | POA: Diagnosis not present

## 2018-08-03 DIAGNOSIS — Z79899 Other long term (current) drug therapy: Secondary | ICD-10-CM | POA: Insufficient documentation

## 2018-08-03 DIAGNOSIS — R1314 Dysphagia, pharyngoesophageal phase: Secondary | ICD-10-CM | POA: Diagnosis not present

## 2018-08-03 DIAGNOSIS — Z8249 Family history of ischemic heart disease and other diseases of the circulatory system: Secondary | ICD-10-CM | POA: Diagnosis not present

## 2018-08-03 DIAGNOSIS — Z823 Family history of stroke: Secondary | ICD-10-CM | POA: Insufficient documentation

## 2018-08-03 DIAGNOSIS — Z803 Family history of malignant neoplasm of breast: Secondary | ICD-10-CM | POA: Diagnosis not present

## 2018-08-03 DIAGNOSIS — L409 Psoriasis, unspecified: Secondary | ICD-10-CM | POA: Diagnosis not present

## 2018-08-03 DIAGNOSIS — F319 Bipolar disorder, unspecified: Secondary | ICD-10-CM | POA: Insufficient documentation

## 2018-08-03 DIAGNOSIS — Z888 Allergy status to other drugs, medicaments and biological substances status: Secondary | ICD-10-CM | POA: Diagnosis not present

## 2018-08-03 DIAGNOSIS — R12 Heartburn: Secondary | ICD-10-CM | POA: Diagnosis not present

## 2018-08-03 DIAGNOSIS — Z881 Allergy status to other antibiotic agents status: Secondary | ICD-10-CM | POA: Diagnosis not present

## 2018-08-03 HISTORY — PX: ESOPHAGOGASTRODUODENOSCOPY (EGD) WITH PROPOFOL: SHX5813

## 2018-08-03 SURGERY — ESOPHAGOGASTRODUODENOSCOPY (EGD) WITH PROPOFOL
Anesthesia: General

## 2018-08-03 MED ORDER — CHLORHEXIDINE GLUCONATE CLOTH 2 % EX PADS: 6.0000 | MEDICATED_PAD | Freq: Once | CUTANEOUS | Status: DC

## 2018-08-03 MED ORDER — PROPOFOL 500 MG/50ML IV EMUL
INTRAVENOUS | Status: DC | PRN
  Administered 2018-08-03: 150 ug/kg/min via INTRAVENOUS

## 2018-08-03 MED ORDER — MIDAZOLAM HCL 2 MG/2ML IJ SOLN: 0.5000 mg | Freq: Once | INTRAMUSCULAR | Status: DC | PRN

## 2018-08-03 MED ORDER — MIDAZOLAM HCL 5 MG/5ML IJ SOLN
INTRAMUSCULAR | Status: DC | PRN
  Administered 2018-08-03: 2 mg via INTRAVENOUS

## 2018-08-03 MED ORDER — KETAMINE HCL 10 MG/ML IJ SOLN
INTRAMUSCULAR | Status: DC | PRN
  Administered 2018-08-03: 10 mg via INTRAVENOUS

## 2018-08-03 MED ORDER — LIDOCAINE HCL 1 % IJ SOLN
INTRAMUSCULAR | Status: DC | PRN
  Administered 2018-08-03: 50 mg via INTRADERMAL

## 2018-08-03 MED ORDER — HYDROCODONE-ACETAMINOPHEN 7.5-325 MG PO TABS: 1.0000 | ORAL_TABLET | Freq: Once | ORAL | Status: DC | PRN

## 2018-08-03 MED ORDER — HYDROMORPHONE HCL 1 MG/ML IJ SOLN: 0.2500 mg | INTRAMUSCULAR | Status: DC | PRN

## 2018-08-03 MED ORDER — PROPOFOL 10 MG/ML IV BOLUS
INTRAVENOUS | Status: AC
  Filled 2018-08-03: qty 20

## 2018-08-03 MED ORDER — KETAMINE HCL 50 MG/5ML IJ SOSY
PREFILLED_SYRINGE | INTRAMUSCULAR | Status: AC
  Filled 2018-08-03: qty 5

## 2018-08-03 MED ORDER — PROMETHAZINE HCL 25 MG/ML IJ SOLN: 6.2500 mg | INTRAMUSCULAR | Status: DC | PRN

## 2018-08-03 MED ORDER — LACTATED RINGERS IV SOLN
INTRAVENOUS | Status: DC
  Administered 2018-08-03: 10:00:00 via INTRAVENOUS

## 2018-08-03 MED ORDER — PROPOFOL 10 MG/ML IV BOLUS
INTRAVENOUS | Status: DC | PRN
  Administered 2018-08-03: 10 mg via INTRAVENOUS
  Administered 2018-08-03: 20 mg via INTRAVENOUS

## 2018-08-03 MED ORDER — MIDAZOLAM HCL 2 MG/2ML IJ SOLN
INTRAMUSCULAR | Status: AC
  Filled 2018-08-03: qty 2

## 2018-08-03 NOTE — H&P (Signed)
@LOGO @   Primary Care Physician:  Kathyrn Drown, MD Primary Gastroenterologist:  Dr. Gala Romney  Pre-Procedure History & Physical: HPI:  Belinda [name] is a 38 y.o. female here for longstanding GERD and dyspeptic symptoms.  Diagnostic EGD to further evaluate.  Takes PPI 4 times daily describes esophageal dysphagia as well.  Past Medical History:  Diagnosis Date  . Anxiety    compulsive worring, phobia  . Anxiety   . Asthma   . Bipolar affective ([place])   . Narcotic addiction ([place])   . Psoriasis   . Reactive airways dysfunction syndrome ([place])   . Vaginal Pap smear, abnormal     Past Surgical History:  Procedure Laterality Date  . CESAREAN SECTION     twice  . COLPOSCOPY    . DILATION AND CURETTAGE OF UTERUS    . PID      Prior to Admission medications   Medication Sig Start Date End Date Taking? Authorizing Provider  albuterol (PROVENTIL HFA;VENTOLIN HFA) 108 (90 Base) MCG/ACT inhaler INHALE 2 PUFFS INTO THE LUNGS EVERY 6 HOURS AS NEEDED FOR WHEEZING 05/22/18  Yes Luking, Elayne Snare, MD  ALPRAZolam (XANAX) 1 MG tablet Take 1 tablet (1 mg total) by mouth 4 (four) times daily as needed. 06/21/18  Yes Kathyrn Drown, MD  cefPROZIL (CEFZIL) 500 MG tablet Take 1 tablet (500 mg total) by mouth 2 (two) times daily. 06/21/18  Yes Luking, Elayne Snare, MD  fluticasone (FLONASE) 50 MCG/ACT nasal spray Place 2 sprays into both nostrils daily. 06/21/18  Yes Kathyrn Drown, MD  fluticasone (FLOVENT HFA) 110 MCG/ACT inhaler 1 puffs twice daily for asthma 06/21/18  Yes Luking, Elayne Snare, MD  linaclotide [facility]) 290 MCG CAPS capsule Take 1 capsule (290 mcg total) by mouth daily before breakfast. 06/29/18  Yes Mahala Menghini, PA-C  pantoprazole (PROTONIX) 40 MG tablet Take 1 tablet (40 mg total) by mouth 2 (two) times daily before a meal. 06/29/18  Yes Mahala Menghini, PA-C  sucralfate (CARAFATE) 1 g tablet TAKE 1 TABLET BY MOUTH THREE TIMES DAILY 06/21/18  Yes Luking, Elayne Snare, MD  albuterol (PROVENTIL)  (2.5 MG/3ML) 0.083% nebulizer solution Take 2.5 mg by nebulization every 6 (six) hours as needed for wheezing or shortness of breath.    [provider]  clobetasol (TEMOVATE) 0.05 % external solution APPLY TO THE AFFECTED AREA TWICE DAILY AS NEEDED 06/21/18   Kathyrn Drown, MD  loratadine (CLARITIN) 10 MG tablet Take one tablet by mouth daily for allergies. 05/29/18   Kathyrn Drown, MD  Olopatadine HCl (PAZEO) 0.7 % SOLN Apply 1 drop to eye daily as needed. 06/21/18   Kathyrn Drown, MD    Allergies as of 06/29/2018 - Review Complete 06/28/2018  Allergen Reaction Noted  . Celexa [citalopram hydrobromide]  06/24/2016  . Wellbutrin [bupropion]  09/01/2015  . Zoloft [sertraline hcl]  06/28/2018  . Augmentin [amoxicillin-pot clavulanate]  05/12/2012  . Doxycycline Rash 05/17/2012    Family History  Problem Relation Age of Onset  . Hypertension Father   . Heart attack Father   . Stroke Father   . Heart disease Maternal Grandmother   . Hypertension Brother   . Breast cancer Maternal Aunt   . Colon cancer Neg Hx     Social History   Socioeconomic History  . Marital status: Legally Separated    Spouse name: Not on file  . Number of children: 2  . Years of education: Not on file  .  Highest education level: Not on file  Occupational History  . Not on file  Social Needs  . Financial resource strain: Not on file  . Food insecurity    Worry: Not on file    Inability: Not on file  . Transportation needs    Medical: Not on file    Non-medical: Not on file  Tobacco Use  . Smoking status: Current Some Day Smoker    Types: Cigarettes  . Smokeless tobacco: Never Used  . Tobacco comment: light smoker 5 cigs daily  Substance and Sexual Activity  . Alcohol use: No  . Drug use: No    Comment: Denies  . Sexual activity: Yes    Birth control/protection: Pill  Lifestyle  . Physical activity    Days per week: Not on file    Minutes per session: Not on file  . Stress: Not on  file  Relationships  . Social Herbalist on phone: Not on file    Gets together: Not on file    Attends religious service: Not on file    Active member of club or organization: Not on file    Attends meetings of clubs or organizations: Not on file    Relationship status: Not on file  . Intimate partner violence    Fear of current or ex partner: Not on file    Emotionally abused: Not on file    Physically abused: Not on file    Forced sexual activity: Not on file  Other Topics Concern  . Not on file  Social History Narrative  . Not on file    Review of Systems: See HPI, otherwise negative ROS  Physical Exam: BP 125/81   Pulse 75   Temp 98 F (36.7 C) (Oral)   Resp (!) 34   Ht 5' 1"  (1.549 m)   Wt 106 kg   BMI 44.15 kg/m  General:   Alert,  Well-developed, well-nourished, pleasant and cooperative in NAD Mouth:  No deformity or lesions. Neck:  Supple; no masses or thyromegaly. No significant cervical adenopathy. Lungs:  Clear throughout to auscultation.   No wheezes, crackles, or rhonchi. No acute distress. Heart:  Regular rate and rhythm; no murmurs, clicks, rubs,  or gallops. Abdomen: Non-distended, normal bowel sounds.  Soft and nontender without appreciable mass or hepatosplenomegaly.  Pulses:  Normal pulses noted. Extremities:  Without clubbing or edema.  Impression/Plan: 38 year old with GERD dyspepsia vague esophageal dysphagia.  Here for EGD with possible esophageal dilation per plan.  The risks, benefits, limitations, imponderables and alternatives regarding both EGD and colonoscopy have been reviewed with the patient. Questions have been answered. All parties agreeable.      Notice: This dictation was prepared with Dragon dictation along with smaller phrase technology. Any transcriptional errors that result from this process are unintentional and may not be corrected upon review.

## 2018-08-03 NOTE — Anesthesia Postprocedure Evaluation (Signed)
Anesthesia Post Note  Patient: Belinda [name]  Procedure(s) Performed: ESOPHAGOGASTRODUODENOSCOPY (EGD) WITH PROPOFOL (N/A )  Anesthesia Type: General Level of consciousness: awake and alert and oriented Pain management: pain level controlled Vital Signs Assessment: post-procedure vital signs reviewed and stable Respiratory status: spontaneous breathing Cardiovascular status: blood pressure returned to baseline and stable Postop Assessment: no apparent nausea or vomiting Anesthetic complications: no     Last Vitals:  Vitals:   08/03/18 0948  BP: 125/81  Pulse: 75  Resp: (!) 34  Temp: 36.7 C    Last Pain:  Vitals:   08/03/18 1129  TempSrc:   PainSc: 7                  Lesean Woolverton

## 2018-08-03 NOTE — Transfer of Care (Signed)
Immediate Anesthesia Transfer of Care Note  Patient: Belinda [name]  Procedure(s) Performed: ESOPHAGOGASTRODUODENOSCOPY (EGD) WITH PROPOFOL (N/A )  Patient Location: PACU  Anesthesia Type:General  Level of Consciousness: awake  Airway & Oxygen Therapy: Patient Spontanous Breathing  Post-op Assessment: Report given to RN  Post vital signs: Reviewed  Last Vitals:  Vitals Value Taken Time  BP 100/63 08/03/18 1151  Temp    Pulse 95 08/03/18 1154  Resp 31 08/03/18 1154  SpO2 91 % 08/03/18 1154  Vitals shown include unvalidated device data.  Last Pain:  Vitals:   08/03/18 1129  TempSrc:   PainSc: 7          Complications: No apparent anesthesia complications

## 2018-08-03 NOTE — Anesthesia Preprocedure Evaluation (Addendum)
Anesthesia Evaluation  Patient identified by MRN, date of birth, ID band Patient awake    Reviewed: Allergy & Precautions, NPO status , Patient's Chart, lab work & pertinent test results  Airway Mallampati: II  TM Distance: >3 FB Neck ROM: Full    Dental no notable dental hx. (+) Poor Dentition, Chipped   Pulmonary asthma , Current Smoker,  Uses inhalers daily  Smoker    Pulmonary exam normal breath sounds clear to auscultation       Cardiovascular Exercise Tolerance: Good negative cardio ROS Normal cardiovascular examI Rhythm:Regular Rate:Normal     Neuro/Psych PSYCHIATRIC DISORDERS Anxiety Bipolar Disorder Reports psychiatric hospitalizations when 3 and 38 years old.  Now with OCD/depression/anxiety negative neurological ROS     GI/Hepatic Neg liver ROS, GERD  Medicated and Poorly Controlled,Denies GERD Sx today  Took meds this am and is NPO   Endo/Other  negative endocrine ROS  Renal/GU negative Renal ROS  negative genitourinary   Musculoskeletal negative musculoskeletal ROS (+)   Abdominal   Peds negative pediatric ROS (+)  Hematology negative hematology ROS (+)   Anesthesia Other Findings broken front tooth noted -pt aware   Reproductive/Obstetrics negative OB ROS                            Anesthesia Physical Anesthesia Plan  ASA: III  Anesthesia Plan: General   Post-op Pain Management:    Induction: Intravenous  PONV Risk Score and Plan: 2 and Treatment may vary due to age or medical condition  Airway Management Planned: Nasal Cannula and Simple Face Mask  Additional Equipment:   Intra-op Plan:   Post-operative Plan: Extubation in OR  Informed Consent: I have reviewed the patients History and Physical, chart, labs and discussed the procedure including the risks, benefits and alternatives for the proposed anesthesia with the patient or authorized representative who  has indicated his/her understanding and acceptance.     Dental advisory given  Plan Discussed with: CRNA  Anesthesia Plan Comments: (Plan full PPE use  Plan GA vs GETA as needed -d/w pt -WTP with same after Q&A Anxious )        Anesthesia Quick Evaluation

## 2018-08-03 NOTE — Op Note (Signed)
[facility] Patient Name: Belinda [name] Procedure Date: 08/03/2018 11:05 AM MRN: [id number] Date of Birth: 04-Jun-1980 Attending MD: Norvel Richards , MD CSN: [id number] Age: 38 Admit Type: Outpatient Procedure:                Upper GI endoscopy Indications:              Dyspepsia, Heartburn Providers:                Norvel Richards, MD, Jeanann Lewandowsky. Sharon Seller, RN,                            Aram Candela Referring MD:              Medicines:                Propofol per Anesthesia Complications:            No immediate complications. Estimated Blood Loss:     Estimated blood loss: none. Estimated blood loss:                            none. Procedure:                Pre-Anesthesia Assessment:                           - Prior to the procedure, a History and Physical                            was performed, and patient medications and                            allergies were reviewed. The patient's tolerance of                            previous anesthesia was also reviewed. The risks                            and benefits of the procedure and the sedation                            options and risks were discussed with the patient.                            All questions were answered, and informed consent                            was obtained. ASA Grade Assessment: II - A patient                            with mild systemic disease. After reviewing the                            risks and benefits, the patient was deemed in  satisfactory condition to undergo the procedure.                           After obtaining informed consent, the endoscope was                            passed under direct vision. Throughout the                            procedure, the patient's blood pressure, pulse, and                            oxygen saturations were monitored continuously. The                            GIF-H190 ([id number]) scope was introduced  through the                            mouth, and advanced to the second part of duodenum.                            The upper GI endoscopy was accomplished without                            difficulty. The patient tolerated the procedure                            well. Scope In: 11:38:56 AM Scope Out: 11:45:03 AM Total Procedure Duration: 0 hours 6 minutes 7 seconds  Findings:      The examined esophagus was normal.      The entire examined stomach was normal.      The duodenal bulb and second portion of the duodenum were normal. I       elected to attempt an empiric dilation with a 54 [place] Maloney dilator.       I was unable to start advancement at the hypopharynx. In this setting, I       did not feel the risk of pursuing empiric dilation was worth the       benefit. No dilation performed Impression:               - Normal esophagus.                           - Normal stomach.                           - Normal duodenal bulb and second portion of the                            duodenum.                           - No specimens collected. Moderate Sedation:      Moderate (conscious) sedation was personally administered by an       anesthesia professional. The following parameters were monitored: oxygen  saturation, heart rate, blood pressure, respiratory rate, EKG, adequacy       of pulmonary ventilation, and response to care. Recommendation:           - Written discharge instructions were provided to                            the patient.                           - The signs and symptoms of potential delayed                            complications were discussed with the patient.                           - Patient has a contact number available for                            emergencies. Dopp Carafate, Protonix and                            omeprazole. 2-week course of Dexilant 60 mg                            daily?"samples provided                           -  Return to normal activities tomorrow.                           - Advance diet as tolerated.                           -At this point, I feel that cholecystectomy should                            be approached as a DIAGNOSTIC maneuver with a 50-50                            chance of helping her symptoms. Lets see how she                            does with Dexilant. Further recommendations to                            follow. Procedure Code(s):        --- Professional ---                           [phone], Esophagogastroduodenoscopy, flexible,                            transoral; diagnostic, including collection of                            specimen(s) by brushing or  washing, when performed                            (separate procedure) Diagnosis Code(s):        --- Professional ---                           R10.13, Epigastric pain                           R12, Heartburn CPT copyright 2019 American Medical Association. All rights reserved. The codes documented in this report are preliminary and upon coder review may  be revised to meet current compliance requirements. [name]. [name], MD Norvel Richards, MD 08/03/2018 12:01:30 PM This report has been signed electronically. Number of Addenda: 0

## 2018-08-03 NOTE — Discharge Instructions (Signed)
EGD Discharge instructions Please read the instructions outlined below and refer to this sheet in the next few weeks. These discharge instructions provide you with general information on caring for yourself after you leave the hospital. Your doctor may also give you specific instructions. While your treatment has been planned according to the most current medical practices available, unavoidable complications occasionally occur. If you have any problems or questions after discharge, please call your doctor. ACTIVITY  You may resume your regular activity but move at a slower pace for the next 24 hours.   Take frequent rest periods for the next 24 hours.   Walking will help expel (get rid of) the air and reduce the bloated feeling in your abdomen.   No driving for 24 hours (because of the anesthesia (medicine) used during the test).   You may shower.   Do not sign any important legal documents or operate any machinery for 24 hours (because of the anesthesia used during the test).  NUTRITION  Drink plenty of fluids.   You may resume your normal diet.   Begin with a light meal and progress to your normal diet.   Avoid alcoholic beverages for 24 hours or as instructed by your caregiver.  MEDICATIONS  You may resume your normal medications unless your caregiver tells you otherwise.  WHAT YOU CAN EXPECT TODAY  You may experience abdominal discomfort such as a feeling of fullness or gas pains.  FOLLOW-UP  Your doctor will discuss the results of your test with you.  SEEK IMMEDIATE MEDICAL ATTENTION IF ANY OF THE FOLLOWING OCCUR:  Excessive nausea (feeling sick to your stomach) and/or vomiting.   Severe abdominal pain and distention (swelling).   Trouble swallowing.   Temperature over 101 F (37.8 C).   Rectal bleeding or vomiting of blood.   GERD information provided  Stop Protonix, Carafate and omeprazole/Prilosec  2-week trial of Dexilant 60 mg daily-go by my office for  2-week supply free samples    *****please call office prior to pickup of samples*******  I am not sure whether or not getting her gallbladder out will help your symptoms.  Probably only a 50-50 chance.  Lets see how you do with Dexilant and we will go from there.  At patient's request, I reviewed impression and recommendations with [place] at [phone]    Gastroesophageal Reflux Disease, Adult Gastroesophageal reflux (GER) happens when acid from the stomach flows up into the tube that connects the mouth and the stomach (esophagus). Normally, food travels down the esophagus and stays in the stomach to be digested. With GER, food and stomach acid sometimes move back up into the esophagus. You may have a disease called gastroesophageal reflux disease (GERD) if the reflux:  Happens often.  Causes frequent or very bad symptoms.  Causes problems such as damage to the esophagus. When this happens, the esophagus becomes sore and swollen (inflamed). Over time, GERD can make small holes (ulcers) in the lining of the esophagus. What are the causes? This condition is caused by a problem with the muscle between the esophagus and the stomach. When this muscle is weak or not normal, it does not close properly to keep food and acid from coming back up from the stomach. The muscle can be weak because of:  Tobacco use.  Pregnancy.  Having a certain type of hernia (hiatal hernia).  Alcohol use.  Certain foods and drinks, such as coffee, chocolate, onions, and peppermint. What increases the risk? You are more likely  to develop this condition if you:  Are overweight.  Have a disease that affects your connective tissue.  Use NSAID medicines. What are the signs or symptoms? Symptoms of this condition include:  Heartburn.  Difficult or painful swallowing.  The feeling of having a lump in the throat.  A bitter taste in the mouth.  Bad breath.  Having a lot of saliva.  Having an  upset or bloated stomach.  Belching.  Chest pain. Different conditions can cause chest pain. Make sure you see your doctor if you have chest pain.  Shortness of breath or noisy breathing (wheezing).  Ongoing (chronic) cough or a cough at night.  Wearing away of the surface of teeth (tooth enamel).  Weight loss. How is this treated? Treatment will depend on how bad your symptoms are. Your doctor may suggest:  Changes to your diet.  Medicine.  Surgery. Follow these instructions at home: Eating and drinking   Follow a diet as told by your doctor. You may need to avoid foods and drinks such as: ? Coffee and tea (with or without caffeine). ? Drinks that contain alcohol. ? Energy drinks and sports drinks. ? Bubbly (carbonated) drinks or sodas. ? Chocolate and cocoa. ? Peppermint and mint flavorings. ? Garlic and onions. ? Horseradish. ? Spicy and acidic foods. These include peppers, chili powder, curry powder, vinegar, hot sauces, and BBQ sauce. ? Citrus fruit juices and citrus fruits, such as oranges, lemons, and limes. ? Tomato-based foods. These include red sauce, chili, salsa, and pizza with red sauce. ? Fried and fatty foods. These include donuts, french fries, potato chips, and high-fat dressings. ? High-fat meats. These include hot dogs, rib eye steak, sausage, ham, and bacon. ? High-fat dairy items, such as whole milk, butter, and cream cheese.  Eat small meals often. Avoid eating large meals.  Avoid drinking large amounts of liquid with your meals.  Avoid eating meals during the 2-3 hours before bedtime.  Avoid lying down right after you eat.  Do not exercise right after you eat. Lifestyle   Do not use any products that contain nicotine or tobacco. These include cigarettes, e-cigarettes, and chewing tobacco. If you need help quitting, ask your doctor.  Try to lower your stress. If you need help doing this, ask your doctor.  If you are overweight, lose an  amount of weight that is healthy for you. Ask your doctor about a safe weight loss goal. General instructions  Pay attention to any changes in your symptoms.  Take over-the-counter and prescription medicines only as told by your doctor. Do not take aspirin, ibuprofen, or other NSAIDs unless your doctor says it is okay.  Wear loose clothes. Do not wear anything tight around your waist.  Raise (elevate) the head of your bed about 6 inches (15 cm).  Avoid bending over if this makes your symptoms worse.  Keep all follow-up visits as told by your doctor. This is important. Contact a doctor if:  You have new symptoms.  You lose weight and you do not know why.  You have trouble swallowing or it hurts to swallow.  You have wheezing or a cough that keeps happening.  Your symptoms do not get better with treatment.  You have a hoarse voice. Get help right away if:  You have pain in your arms, neck, jaw, teeth, or back.  You feel sweaty, dizzy, or light-headed.  You have chest pain or shortness of breath.  You throw up (vomit) and your throw-up  looks like blood or coffee grounds.  You pass out (faint).  Your poop (stool) is bloody or black.  You cannot swallow, drink, or eat. Summary  If a person has gastroesophageal reflux disease (GERD), food and stomach acid move back up into the esophagus and cause symptoms or problems such as damage to the esophagus.  Treatment will depend on how bad your symptoms are.  Follow a diet as told by your doctor.  Take all medicines only as told by your doctor. This information is not intended to replace advice given to you by your health care provider. Make sure you discuss any questions you have with your health care provider. Document Released: 07/28/2007 Document Revised: 08/17/2017 Document Reviewed: 08/17/2017 Elsevier Interactive Patient Education  2019 [place], Care After These instructions  provide you with information about caring for yourself after your procedure. Your health care provider may also give you more specific instructions. Your treatment has been planned according to current medical practices, but problems sometimes occur. Call your health care provider if you have any problems or questions after your procedure. What can I expect after the procedure? After your procedure, you may:  Feel sleepy for several hours.  Feel clumsy and have poor balance for several hours.  Feel forgetful about what happened after the procedure.  Have poor judgment for several hours.  Feel nauseous or vomit.  Have a sore throat if you had a breathing tube during the procedure. Follow these instructions at home: For at least 24 hours after the procedure:      Have a responsible adult stay with you. It is important to have someone help care for you until you are awake and alert.  Rest as needed.  Do not: ? Participate in activities in which you could fall or become injured. ? Drive. ? Use heavy machinery. ? Drink alcohol. ? Take sleeping pills or medicines that cause drowsiness. ? Make important decisions or sign legal documents. ? Take care of children on your own. Eating and drinking  Follow the diet that is recommended by your health care provider.  If you vomit, drink water, juice, or soup when you can drink without vomiting.  Make sure you have little or no nausea before eating solid foods. General instructions  Take over-the-counter and prescription medicines only as told by your health care provider.  If you have sleep apnea, surgery and certain medicines can increase your risk for breathing problems. Follow instructions from your health care provider about wearing your sleep device: ? Anytime you are sleeping, including during daytime naps. ? While taking prescription pain medicines, sleeping medicines, or medicines that make you drowsy.  If you smoke, do not  smoke without supervision.  Keep all follow-up visits as told by your health care provider. This is important. Contact a health care provider if:  You keep feeling nauseous or you keep vomiting.  You feel light-headed.  You develop a rash.  You have a fever. Get help right away if:  You have trouble breathing. Summary  For several hours after your procedure, you may feel sleepy and have poor judgment.  Have a responsible adult stay with you for at least 24 hours or until you are awake and alert. This information is not intended to replace advice given to you by your health care provider. Make sure you discuss any questions you have with your health care provider. Document Released: 06/01/2015 Document Revised: 09/24/2016 Document Reviewed:  06/01/2015 Elsevier Interactive Patient Education  [employer].

## 2018-08-04 NOTE — Telephone Encounter (Signed)
Nurses You may call the patient on Monday with the following I reviewed over her records. So based upon what gastroenterology stated they are trying Dexilant for the next couple weeks Hopefully that will help If any ongoing issues or concerns I can do a phone consultation ( within the next 2 weeks-not emergent) with the patient to discuss further

## 2018-08-07 NOTE — Telephone Encounter (Signed)
Left message to return call 

## 2018-08-09 ENCOUNTER — Encounter (HOSPITAL_COMMUNITY): Payer: Self-pay | Admitting: Internal Medicine

## 2018-08-09 ENCOUNTER — Telehealth: Payer: Self-pay | Admitting: Internal Medicine

## 2018-08-09 NOTE — Telephone Encounter (Signed)
Patient called and said she feels like her stomach is bloated, wants to know if she needs another medication for the bloating.  [phone] said the dexilant is doing good

## 2018-08-09 NOTE — Telephone Encounter (Signed)
Patient called in stating that the [place] is helping, however the Linzess 290 mg is not helping with her constipation.   She would like advice on her constipation, gas and abd pain and bloating.

## 2018-08-09 NOTE — Telephone Encounter (Signed)
I have put her on RMRs schedule for 6/23 at 9:00 am  Routing to [place]

## 2018-08-09 NOTE — Telephone Encounter (Signed)
Lmom, waiting on a return call.  

## 2018-08-10 MED ORDER — LUBIPROSTONE 24 MCG PO CAPS: 24.0000 ug | ORAL_CAPSULE | Freq: Two times a day (BID) | ORAL | 5 refills | Status: DC

## 2018-08-10 MED ORDER — DEXILANT 60 MG PO CPDR: 60.0000 mg | DELAYED_RELEASE_CAPSULE | Freq: Every day | ORAL | 11 refills | Status: DC

## 2018-08-10 NOTE — Telephone Encounter (Signed)
LMOM to call.

## 2018-08-10 NOTE — Telephone Encounter (Signed)
Pt is running out of her samples of Dexilant and wants a RX sent to her pharmacy. Pt feels Dexilant works well and she occasionally feels a little bloating with pressure. Pt asked if she should take another Dexilant when that pressure and bloating starts. Pt was advised not to take another Dexilant. Pt is also taking Linzess 290 MCG x 3 weeks and only has a bowel movement once weekly. Pt takes an enema to help her have a bowel movement and isn't sure if she needs to take another medication.

## 2018-08-10 NOTE — Addendum Note (Signed)
Addended by: Mahala Menghini on: 08/10/2018 02:23 PM   Modules accepted: Orders

## 2018-08-10 NOTE — Telephone Encounter (Signed)
Take only one Dexilant daily. RX sent [facility]   Stop Linzess.  Try Amitiza 73mg BID.RX sent, [facility] [place]  Keep upcoming OV.

## 2018-08-11 NOTE — Telephone Encounter (Signed)
Agree with need for ed evaluation if severe pain.

## 2018-08-11 NOTE — Telephone Encounter (Signed)
Spoke with pt. She is aware of the medications sent into her pharmacy. Pt also said she had a bad gas attack last night that started around 4 pm  Yesterday and lasted until 3 am this morning. Pt wasn't able to move. She took more PPI's then needed due to the pain. She vomited this morning around 3 am for 30 mins. Pt felt like the gas pain moved after she vomited. pts abdomen is very sore and she states it feels like I had a gall bladder attack. She wanted to go to the ED but felt she was too scared of getting Covid-19 and couldn't even move due to the gas/stomach pain being that severe. Pt is aware that if this pain starts back, she needs to head to the ED.

## 2018-08-14 NOTE — Telephone Encounter (Signed)
Patient called back and is aware

## 2018-08-14 NOTE — Telephone Encounter (Signed)
Lmom, waiting on a return call.  

## 2018-08-15 ENCOUNTER — Ambulatory Visit: Payer: Medicaid Other | Admitting: Internal Medicine

## 2018-08-15 NOTE — Telephone Encounter (Signed)
Left message to return call 

## 2018-08-17 ENCOUNTER — Telehealth: Payer: Self-pay | Admitting: Internal Medicine

## 2018-08-17 DIAGNOSIS — K219 Gastro-esophageal reflux disease without esophagitis: Secondary | ICD-10-CM

## 2018-08-17 NOTE — Telephone Encounter (Signed)
[phone] please call patient about her dexilant, she wants to know if she can take 2 because one is not working all day.

## 2018-08-17 NOTE — Telephone Encounter (Signed)
Spoke with pt. She stayed on the phone for 38 minutes. Pt wants to know what she can take  Dexilant 60 mg twice daily.  She continues to take Protonix on some days. Pt was taking it Protonix bid before she started the [employer]. Pt was advised not to take Dexilant 60 mg twice daily without an approval of a provider. Pt is aware that Dexilant 60 mg bid is a high dose and isn't taken bid. Pt has c/o burning sensations, feelings of acid and what she describes as bubles raising up from her abdomen to her throat. Pt missed her appointment this week due to a death in her family. Pt is scheduled to come in the office 08/2018. Please advise in the absence of LSL.

## 2018-08-18 MED ORDER — FAMOTIDINE 40 MG PO TABS: 40.0000 mg | ORAL_TABLET | Freq: Every day | ORAL | 3 refills | Status: DC

## 2018-08-18 NOTE — Addendum Note (Signed)
Addended by: Gordy Levan, Mckyla Deckman A on: 08/18/2018 03:57 PM   Modules accepted: Orders

## 2018-08-18 NOTE — Telephone Encounter (Signed)
Patient still having symptoms. Wanting to take Dexilant bid. Informed her that it is not approved for this. Recommended she add Pepcid in the evening and keep follow-up appointment in July.

## 2018-08-21 ENCOUNTER — Telehealth: Payer: Self-pay | Admitting: Family Medicine

## 2018-08-21 ENCOUNTER — Other Ambulatory Visit: Payer: Self-pay | Admitting: Family Medicine

## 2018-08-21 MED ORDER — ALPRAZOLAM 1 MG PO TABS: 1.0000 mg | ORAL_TABLET | Freq: Four times a day (QID) | ORAL | 1 refills | Status: DC | PRN

## 2018-08-21 NOTE — Telephone Encounter (Signed)
Pt advised and verbalized understanding.

## 2018-08-21 NOTE — Telephone Encounter (Signed)
Pt returned call. Pt states that she does like how GI is doing her. She doesn't feel that they are thorough with her. Pt would like to set up phone visit with Dr.Scott to discuss further. Pt transferred up front to set up appt

## 2018-08-21 NOTE — Telephone Encounter (Signed)
Please advise. Thank you

## 2018-08-21 NOTE — Telephone Encounter (Signed)
Noted  

## 2018-08-21 NOTE — Telephone Encounter (Signed)
Patient called [facility] [name] will not fill her xanax because they have run out. She is needing new prescription send to [facility] on [address]. She has to call in to get the filled because it is a new rx per  Patient.  She is completely and needing this done today by 8pm when Pharmacy closes.   [phone]

## 2018-08-21 NOTE — Telephone Encounter (Signed)
I went ahead with a refill [facility] [address] Please remind the patient she is getting her 120 tablets they should last a full 30 days

## 2018-08-29 ENCOUNTER — Ambulatory Visit (INDEPENDENT_AMBULATORY_CARE_PROVIDER_SITE_OTHER): Payer: Medicaid Other | Admitting: Family Medicine

## 2018-08-29 ENCOUNTER — Other Ambulatory Visit: Payer: Self-pay

## 2018-08-29 DIAGNOSIS — R1013 Epigastric pain: Secondary | ICD-10-CM | POA: Diagnosis not present

## 2018-08-29 NOTE — Progress Notes (Signed)
   Subjective:    Patient ID: Belinda [name], female    DOB: 24-Jan-1981, 38 y.o.   MRN: [id number] Telephone only video failed HPI Pt would like to speak with provider about GI doctor. Pt states  that she does like how GI is doing her. She doesn't feel that they are thorough with her. Pt would like to set up phone visit with Dr.Scott to discuss further.  The patient is frustrated because she still has GI symptoms going on she had a EGD which was negative she also had H. pylori which was negative she does take her PPI along with famotidine she states she has a lot of nausea stomach upset and discomfort she denies bloody stools or mucousy stools patient has had an ultrasound which was essentially normal and a HIDA which was normal Virtual Visit via Video Note  I connected with Belinda [name] on 08/29/18 at  3:00 PM EDT by a video enabled telemedicine application and verified that I am speaking with the correct person using two identifiers.  Location: Patient: home Provider: office   I discussed the limitations of evaluation and management by telemedicine and the availability of in person appointments. The patient expressed understanding and agreed to proceed.  History of Present Illness:    Observations/Objective:   Assessment and Plan:   Follow Up Instructions:    I discussed the assessment and treatment plan with the patient. The patient was provided an opportunity to ask questions and all were answered. The patient agreed with the plan and demonstrated an understanding of the instructions.   The patient was advised to call back or seek an in-person evaluation if the symptoms worsen or if the condition fails to improve as anticipated.  I provided 15 minutes of non-face-to-face time during this encounter.   Vicente Males, LPN   Review of Systems  Constitutional: Negative for activity change and appetite change.  HENT: Negative for congestion and rhinorrhea.   Respiratory:  Negative for cough and shortness of breath.   Cardiovascular: Negative for chest pain and leg swelling.  Gastrointestinal: Positive for abdominal pain and nausea. Negative for vomiting.  Skin: Negative for color change.  Neurological: Negative for dizziness and weakness.  Psychiatric/Behavioral: Negative for agitation and confusion.       Objective:   Physical Exam  Today's visit was via telephone Physical exam was not possible for this visit       Assessment & Plan:  Given her ongoing symptoms I think it is reasonable for the patient to consider getting a surgical consult According to EGD documentation Dr.Rourke indicated that it would be reasonable for general surgery to give an opinion if her gallbladder could be causing some of her's issues given that her EGD looks good so therefore we will go ahead and help set up the patient for consultation  I encouraged her to continue her GI medicines as well as continue with a follow-up with GI toward the end of July the patient will do a regular follow-up with [place] in August

## 2018-09-13 ENCOUNTER — Other Ambulatory Visit: Payer: Self-pay | Admitting: Family Medicine

## 2018-09-19 ENCOUNTER — Other Ambulatory Visit: Payer: Self-pay

## 2018-09-19 ENCOUNTER — Ambulatory Visit (INDEPENDENT_AMBULATORY_CARE_PROVIDER_SITE_OTHER): Payer: Medicaid Other | Admitting: Family Medicine

## 2018-09-19 DIAGNOSIS — F411 Generalized anxiety disorder: Secondary | ICD-10-CM

## 2018-09-19 DIAGNOSIS — J019 Acute sinusitis, unspecified: Secondary | ICD-10-CM

## 2018-09-19 MED ORDER — CEFPROZIL 500 MG PO TABS: 500.0000 mg | ORAL_TABLET | Freq: Two times a day (BID) | ORAL | 0 refills | Status: DC

## 2018-09-19 MED ORDER — ALPRAZOLAM 1 MG PO TABS: 1.0000 mg | ORAL_TABLET | Freq: Four times a day (QID) | ORAL | 1 refills | Status: DC | PRN

## 2018-09-19 NOTE — Progress Notes (Signed)
   Subjective:    Patient ID: Belinda [name], female    DOB: Dec 20, 1980, 38 y.o.   MRN: [id number] Video Sinus Problem This is a new problem. The current episode started in the past 7 days. Associated symptoms include congestion, coughing and sinus pressure. Pertinent negatives include no ear pain or shortness of breath. (Eyes swollen)  Patient with significant head congestion drainage coughing sinus pressure denies high fever chills sweats wheezing difficulty breathing  History generalized anxiety anxiety disorder refills on Xanax requested  Review of Systems  Constitutional: Negative for activity change and fever.  HENT: Positive for congestion, rhinorrhea and sinus pressure. Negative for ear pain.   Eyes: Negative for discharge.  Respiratory: Positive for cough. Negative for shortness of breath and wheezing.   Cardiovascular: Negative for chest pain.  Virtual Visit via Video Note  I connected with Belinda [name] on 09/19/18 at  2:30 PM EDT by a video enabled telemedicine application and verified that I am speaking with the correct person using two identifiers.  Location: Patient: home Provider: office   I discussed the limitations of evaluation and management by telemedicine and the availability of in person appointments. The patient expressed understanding and agreed to proceed.  History of Present Illness:    Observations/Objective:   Assessment and Plan:   Follow Up Instructions:    I discussed the assessment and treatment plan with the patient. The patient was provided an opportunity to ask questions and all were answered. The patient agreed with the plan and demonstrated an understanding of the instructions.   The patient was advised to call back or seek an in-person evaluation if the symptoms worsen or if the condition fails to improve as anticipated.  I provided 15 minutes of non-face-to-face time during this encounter.         Objective:   Physical Exam    Patient had virtual visit Appears to be in no distress Atraumatic Neuro able to relate and oriented No apparent resp distress Color normal      Assessment & Plan:  Patient was told that if she has progressive troubles or worsening issues or other issues she would need to be seen also if she starts having fever body aches I would recommend COVID testing we will go ahead with antibiotics.  I also went ahead and gave her refills on her Xanax to cover her until she is seen in person in September

## 2018-09-20 ENCOUNTER — Encounter: Payer: Self-pay | Admitting: Gastroenterology

## 2018-09-20 ENCOUNTER — Ambulatory Visit: Payer: Medicaid Other | Admitting: Gastroenterology

## 2018-09-20 NOTE — Progress Notes (Deleted)
Normal EGD June 2020  History of abdominal pain, GERD, constipation, s/p EGD recently normal. [place] with cholelithiasis, fatty liver. HIDA normal. HFP normal.

## 2018-11-07 ENCOUNTER — Other Ambulatory Visit: Payer: Self-pay | Admitting: Family Medicine

## 2018-11-10 ENCOUNTER — Ambulatory Visit (INDEPENDENT_AMBULATORY_CARE_PROVIDER_SITE_OTHER): Payer: Medicaid Other | Admitting: Nurse Practitioner

## 2018-11-10 DIAGNOSIS — J019 Acute sinusitis, unspecified: Secondary | ICD-10-CM

## 2018-11-10 DIAGNOSIS — B9689 Other specified bacterial agents as the cause of diseases classified elsewhere: Secondary | ICD-10-CM | POA: Diagnosis not present

## 2018-11-10 MED ORDER — AZITHROMYCIN 250 MG PO TABS: ORAL_TABLET | ORAL | 0 refills | Status: DC

## 2018-11-10 MED ORDER — FLOVENT HFA 110 MCG/ACT IN AERO: INHALATION_SPRAY | RESPIRATORY_TRACT | 12 refills | Status: DC

## 2018-11-10 NOTE — Progress Notes (Signed)
Phone visit Subjective:    Patient ID: Belinda [name], female    DOB: [birth year], 38 y.o.   MRN: [id number]  Sinusitis This is a new problem.  Dr. Nicki Reaper wrote rx for cefprozil on 09/19/18. Went to dentist and was prescribed amoxil on 10/10/18 and did not take like she should have because she says amoxil does not work for her. Pain in teeth, cheek pain, headache, coughing up green phelm. Face is tender to the touch. Has had some blood in her right ear.  Pt states she did not finish either one of the antibiotcs. She took 2 left over cefprozil.   Virtual Visit via Telephone Note  I connected with Belinda [name] on 11/10/18 at 11:20 AM EDT by telephone and verified that I am speaking with the correct person using two identifiers.  Location: Patient: home Provider: office   I discussed the limitations, risks, security and privacy concerns of performing an evaluation and management service by telephone and the availability of in person appointments. I also discussed with the patient that there may be a patient responsible charge related to this service. The patient expressed understanding and agreed to proceed.   History of Present Illness: See above note.  Symptoms began about 5 days ago.  Facial area headache under the eyes.  Had a few doses of Cefzil and Amoxil which she completed.  Had some slight blood in her right ear while using a Q-tip, no ear pain.  No further bleeding.  Cough mainly in the morning producing green mucus.  Sore throat.  Mild fatigue.  Overall taking fluids well but somewhat decreased due to her tooth pain which is being followed by her dentist.  Voiding normal limit.  Some confusion about her Ventolin and Flovent inhalers.  It is unclear how she is using them but appears to be using the albuterol 5 out of 7 days of the week.  Is not using her Flovent on a regular basis.   Observations/Objective: Today's visit was via telephone Physical exam was not possible for this  visit   Assessment and Plan: Acute bacterial rhinosinusitis  Meds ordered this encounter  Medications  . azithromycin (ZITHROMAX Z-PAK) 250 MG tablet    Sig: Take 2 tablets (500 mg) on  Day 1,  followed by 1 tablet (250 mg) once daily on Days 2 through 5.    Dispense:  6 each    Refill:  0    Order Specific Question:   Supervising Provider    Answer:   Sallee Lange A [[id number]]  . fluticasone (FLOVENT HFA) 110 MCG/ACT inhaler    Sig: 1 puffs twice daily for asthma    Dispense:  1 Inhaler    Refill:  12    Order Specific Question:   Supervising Provider    Answer:   Sallee Lange A [[id number]]     Follow Up Instructions: OTC meds as directed for congestion and cough.  Encourage patient to use her Flovent twice daily as directed.  Verbalizes understanding.  Our goal is for her to reduce her albuterol to no more than 2-3 times per week.  Warning signs reviewed.  Call back next week if no improvement, sooner if worse.   I discussed the assessment and treatment plan with the patient. The patient was provided an opportunity to ask questions and all were answered. The patient agreed with the plan and demonstrated an understanding of the instructions.   The patient was advised to call back or seek  an in-person evaluation if the symptoms worsen or if the condition fails to improve as anticipated.  I provided 15 minutes of non-face-to-face time during this encounter.      Review of Systems     Objective:   Physical Exam        Assessment & Plan:

## 2018-11-11 ENCOUNTER — Encounter: Payer: Self-pay | Admitting: Nurse Practitioner

## 2018-11-14 ENCOUNTER — Ambulatory Visit (INDEPENDENT_AMBULATORY_CARE_PROVIDER_SITE_OTHER): Payer: Medicaid Other | Admitting: Family Medicine

## 2018-11-14 ENCOUNTER — Encounter: Payer: Self-pay | Admitting: Family Medicine

## 2018-11-14 ENCOUNTER — Other Ambulatory Visit: Payer: Self-pay

## 2018-11-14 DIAGNOSIS — F411 Generalized anxiety disorder: Secondary | ICD-10-CM | POA: Diagnosis not present

## 2018-11-14 DIAGNOSIS — E781 Pure hyperglyceridemia: Secondary | ICD-10-CM | POA: Diagnosis not present

## 2018-11-14 DIAGNOSIS — K219 Gastro-esophageal reflux disease without esophagitis: Secondary | ICD-10-CM

## 2018-11-14 DIAGNOSIS — K760 Fatty (change of) liver, not elsewhere classified: Secondary | ICD-10-CM

## 2018-11-14 HISTORY — DX: Fatty (change of) liver, not elsewhere classified: K76.0

## 2018-11-14 MED ORDER — FAMOTIDINE 40 MG PO TABS: 40.0000 mg | ORAL_TABLET | Freq: Every day | ORAL | 5 refills | Status: DC

## 2018-11-14 MED ORDER — ALPRAZOLAM 1 MG PO TABS: 1.0000 mg | ORAL_TABLET | Freq: Four times a day (QID) | ORAL | 5 refills | Status: DC | PRN

## 2018-11-14 NOTE — Progress Notes (Addendum)
   Subjective:    Patient ID: Belinda [name], female    DOB: 10-Feb-1981, 38 y.o.   MRN: [id number]  HPI Virtual Visit via Video Note  I connected with Belinda [name] on 01/21/19 at  9:30 AM EDT by a video enabled telemedicine application and verified that I am speaking with the correct person using two identifiers.  Location: Patient: home Provider: office   I discussed the limitations of evaluation and management by telemedicine and the availability of in person appointments. The patient expressed understanding and agreed to proceed.  History of Present Illness:    Observations/Objective:   Assessment and Plan:   Follow Up Instructions:    I discussed the assessment and treatment plan with the patient. The patient was provided an opportunity to ask questions and all were answered. The patient agreed with the plan and demonstrated an understanding of the instructions.   The patient was advised to call back or seek an in-person evaluation if the symptoms worsen or if the condition fails to improve as anticipated.  I provided 16 minutes of non-face-to-face time during this encounter.   Sallee Lange, MD   Patient calls for a follow up on anxiety. Patient does take her medicine regular basis in addition to this it should be noted that we did look up her drug registry and overall things are going well with it Patient states she was seen Friday virtually for sinus infection and given antibiotic. She denies being depressed  Review of Systems  Constitutional: Negative for activity change, appetite change and fatigue.  HENT: Negative for congestion.   Respiratory: Negative for cough.   Cardiovascular: Negative for chest pain.  Gastrointestinal: Negative for abdominal pain.  Skin: Negative for color change.  Neurological: Negative for headaches.  Psychiatric/Behavioral: Negative for behavioral problems.       Objective:   Physical Exam  Today's visit was via  telephone Physical exam was not possible for this visit       Assessment & Plan:  Significant generalized anxiety disorder uses her medications on a regular basis denies abusing her medicines.  Refills were given drug registry was checked patient to follow-up again in 6 months  Patient has some bad teeth that she is going to have removed she states she is having some jaw issues I told her if that does not clear up she needs to follow-up with [place] in person  Otherwise follow-up 6 months

## 2019-01-10 ENCOUNTER — Other Ambulatory Visit: Payer: Self-pay | Admitting: Gastroenterology

## 2019-01-29 ENCOUNTER — Ambulatory Visit (INDEPENDENT_AMBULATORY_CARE_PROVIDER_SITE_OTHER): Payer: Medicaid Other | Admitting: Family Medicine

## 2019-01-29 ENCOUNTER — Other Ambulatory Visit: Payer: Self-pay

## 2019-01-29 DIAGNOSIS — J019 Acute sinusitis, unspecified: Secondary | ICD-10-CM

## 2019-01-29 MED ORDER — PENICILLIN V POTASSIUM 500 MG PO TABS: ORAL_TABLET | ORAL | 0 refills | Status: DC

## 2019-01-29 NOTE — Progress Notes (Signed)
   Subjective:  Audio plus video  Patient ID: Belinda [name], female    DOB: 25-Mar-1980, 38 y.o.   MRN: [id number]  Sinusitis This is a new problem. Episode onset: Friday night. There has been no fever. (Pt is congested, pt had teeth pulled in October and had 3 dry sockets and a "window" and believes that is the cause of the nasty taste in mouth. Pt is also having green mucus; facial pain/pressure) Treatments tried: antibiotics from dentist  The treatment provided mild relief.   Virtual Visit via Telephone Note  I connected with Belinda [name] on 01/29/19 at  3:00 PM EST by telephone and verified that I am speaking with the correct person using two identifiers.  Location: Patient: home Provider: office   I discussed the limitations, risks, security and privacy concerns of performing an evaluation and management service by telephone and the availability of in person appointments. I also discussed with the patient that there may be a patient responsible charge related to this service. The patient expressed understanding and agreed to proceed.   History of Present Illness:    Observations/Objective:   Assessment and Plan:   Follow Up Instructions:    I discussed the assessment and treatment plan with the patient. The patient was provided an opportunity to ask questions and all were answered. The patient agreed with the plan and demonstrated an understanding of the instructions.   The patient was advised to call back or seek an in-person evaluation if the symptoms worsen or if the condition fails to improve as anticipated.  I provided 17 minutes of non-face-to-face time during this encounter.   Vicente Males, LPN  Pt has frontal headache   Pos odor  Pos discharge from nose  No fever    Review of Systems No headache, no major weight loss or weight gain, no chest pain no back pain abdominal pain no change in bowel habits complete ROS otherwise negative      Objective:   Physical Exam   Virtual     Assessment & Plan:  Impression 1 rhinosinusitis.  Protracted.  Probable association with odontogenic infection.  Distinct stool smelling odor.  Has been told had dry sockets after recent intervention and there was concern about direct connection from roots into the sinuses.  Therefore will give 14 days of penicillin 500 4 times daily

## 2019-01-30 ENCOUNTER — Encounter: Payer: Self-pay | Admitting: Family Medicine

## 2019-02-04 ENCOUNTER — Other Ambulatory Visit: Payer: Self-pay | Admitting: Family Medicine

## 2019-02-08 ENCOUNTER — Other Ambulatory Visit: Payer: Self-pay | Admitting: Family Medicine

## 2019-02-08 NOTE — Telephone Encounter (Signed)
Med check on 11/14/18

## 2019-02-14 ENCOUNTER — Other Ambulatory Visit: Payer: Self-pay

## 2019-02-14 ENCOUNTER — Ambulatory Visit (INDEPENDENT_AMBULATORY_CARE_PROVIDER_SITE_OTHER): Payer: Medicaid Other | Admitting: Family Medicine

## 2019-02-14 DIAGNOSIS — B349 Viral infection, unspecified: Secondary | ICD-10-CM

## 2019-02-14 MED ORDER — AZITHROMYCIN 250 MG PO TABS: ORAL_TABLET | ORAL | 0 refills | Status: DC

## 2019-02-14 NOTE — Progress Notes (Signed)
   Subjective:    Patient ID: Belinda [name], female    DOB: Sep 24, 1980, 38 y.o.   MRN: [id number]  Diarrhea  This is a new problem. The current episode started yesterday. Associated symptoms include coughing. Pertinent negatives include no fever. Associated symptoms comments: Diarrhea, rash that started last night; becomes more red when pt thinks about it (flat red areas on neck), heavy green mucus, sore throat. Treatments tried: Penicillin   Patient also with some drainage a little bit of coughing as well no wheezing or difficulty breathing PMH benign Virtual Visit via Telephone Note  I connected with Belinda [name] on 02/14/19 at 11:00 AM EST by telephone and verified that I am speaking with the correct person using two identifiers.  Location: Patient: home Provider: office   I discussed the limitations, risks, security and privacy concerns of performing an evaluation and management service by telephone and the availability of in person appointments. I also discussed with the patient that there may be a patient responsible charge related to this service. The patient expressed understanding and agreed to proceed.   History of Present Illness:    Observations/Objective:   Assessment and Plan:   Follow Up Instructions:    I discussed the assessment and treatment plan with the patient. The patient was provided an opportunity to ask questions and all were answered. The patient agreed with the plan and demonstrated an understanding of the instructions.   The patient was advised to call back or seek an in-person evaluation if the symptoms worsen or if the condition fails to improve as anticipated.  I provided 15 minutes of non-face-to-face time during this encounter.   Vicente Males, LPN    Review of Systems  Constitutional: Negative for activity change and fever.  HENT: Positive for congestion and rhinorrhea. Negative for ear pain.   Eyes: Negative for discharge.   Respiratory: Positive for cough. Negative for shortness of breath and wheezing.   Cardiovascular: Negative for chest pain.  Gastrointestinal: Positive for diarrhea.       Objective:   Physical Exam Today's visit was via telephone Physical exam was not possible for this visit        Assessment & Plan:  Acute rhinosinusitis antibiotic sent in Viral syndrome probable or possible Covid recommend testing Diarrhea may use Imodium as needed Warning signs discussed in detail Covid testing recommended warning signs were discussed in detail

## 2019-02-15 ENCOUNTER — Ambulatory Visit: Payer: Medicaid Other | Attending: Internal Medicine

## 2019-02-15 ENCOUNTER — Other Ambulatory Visit: Payer: Self-pay

## 2019-02-15 DIAGNOSIS — Z20822 Contact with and (suspected) exposure to covid-19: Secondary | ICD-10-CM

## 2019-02-15 DIAGNOSIS — Z20828 Contact with and (suspected) exposure to other viral communicable diseases: Secondary | ICD-10-CM | POA: Diagnosis not present

## 2019-02-16 LAB — NOVEL CORONAVIRUS, NAA: SARS-CoV-2, NAA: NOT DETECTED

## 2019-04-16 ENCOUNTER — Telehealth: Payer: Self-pay | Admitting: Internal Medicine

## 2019-04-16 ENCOUNTER — Telehealth: Payer: Self-pay | Admitting: Family Medicine

## 2019-04-16 DIAGNOSIS — K219 Gastro-esophageal reflux disease without esophagitis: Secondary | ICD-10-CM

## 2019-04-16 MED ORDER — FAMOTIDINE 40 MG PO TABS: 40.0000 mg | ORAL_TABLET | Freq: Every day | ORAL | 5 refills | Status: DC

## 2019-04-16 NOTE — Telephone Encounter (Signed)
May have 5 refills Rec med check in March

## 2019-04-16 NOTE — Telephone Encounter (Signed)
Pt called saying that [facility] on [address] told her she needed a doctor's authorization on her Dexilant. She said that she has 3 refills left. Please call her at [phone]

## 2019-04-16 NOTE — Telephone Encounter (Signed)
Patient states she is going to call the pharmacy because they ended up charging her 28 dollars instead of 3 dollars like normal.  Patient is going to call them and call us back if anything needs to be done to get the prescription back to 3 dollars.

## 2019-04-16 NOTE — Telephone Encounter (Signed)
Called [facility] to see what was going on because we did not receive a PA and was told she just didn't have any refills it was not because insurance would not pay. Last med check sept 2020. Can she have refills

## 2019-04-16 NOTE — Telephone Encounter (Signed)
Patient is saying that insurance won't pay for her Pepcid, even though she got it in January.  Wants [place] to call the pharmacy and find out why because she has to have it.  [facility] on [address]

## 2019-04-17 NOTE — Telephone Encounter (Signed)
Spoke with pt. Will call her insurance company today. Pt has previously tried Pantoprazole and Omeprazole

## 2019-04-17 NOTE — Telephone Encounter (Signed)
Pt's pharmacy was notified of approval.

## 2019-04-17 NOTE — Telephone Encounter (Signed)
PA has been submitted via phone [place] [employer] for Dexilant 60 mg. PA has been approved by [employer] for 1 year. PA X [id number]. PA is good through 04/17/2019-04/11/2020.

## 2019-05-16 ENCOUNTER — Telehealth: Payer: Self-pay | Admitting: Family Medicine

## 2019-05-16 NOTE — Telephone Encounter (Signed)
Patient states that filling it on day 29 will not work she needs it on day 28 which gives her 8 extra pills a mont which is 2 extra pills a week and she can not make it thru the week without the 2 extra pills. Patient wants office visit to discuss her issue personally with Dr Nicki Reaper. Patient scheduled office visit Tuesday with Dr Nicki Reaper.

## 2019-05-16 NOTE — Telephone Encounter (Signed)
Patient states she has to fill it every 28 days because that gives her 2 extra pills a week to take as needed with her regular pills when she needs it. Patient states she has been taking the extra pills like this for 10 years and she wants it written to the pharmacy that her  is a 28 day supply otherwise they will only fill it every 30 days. Patient said she has told you she takes the extra and was told it was fine and she can only get it every 30 days without a doctors order. Patient states if she cant get it but every 30 days she will run out early each month and have to ask to get it early anyway. Patient needs this  ASAP so she can get her meds. Taking her meds this way with the extra pills is the only way she gets thru the month.

## 2019-05-16 NOTE — Telephone Encounter (Signed)
Needs [place] to call the pharmacy and tell them that she can fill her Xanax every 28 days instead of every 30.   [facility] [address]

## 2019-05-16 NOTE — Telephone Encounter (Signed)
(  So some pharmacist allow for the medication to be every 28 days-in my opinion when I give 30 days of medicine that is for 30 days-it is like picking up 30 days worth of food-the food should last for 30 days then you pick up your new supply-same thing goes for Xanax) If the pharmacy desires to allow for her to get it the day early that is up to them as for [place] according to the [place] board 1 week issue a controlled medicine a 30-day supply is to last 30 days

## 2019-05-16 NOTE — Telephone Encounter (Signed)
Thank you for the detailed explanation (Getting it at 28 days essentially allows eight extra tablets per month which is more than a couple) I will agree to allowing it to be filled 1 day early  I suppose a prescription could be sent in as Xanax 120 tablets one 4 times daily as needed with note to pharmacy that it may be filled day 29 if patient requests If you need to redo the prescription and pend it please do so But as for stating I am there to fill it on day 28 that's a no go

## 2019-05-22 ENCOUNTER — Telehealth: Payer: Self-pay | Admitting: Family Medicine

## 2019-05-22 ENCOUNTER — Ambulatory Visit (INDEPENDENT_AMBULATORY_CARE_PROVIDER_SITE_OTHER): Payer: Medicaid Other | Admitting: Family Medicine

## 2019-05-22 VITALS — BP 122/74 | Temp 97.4°F | Wt 236.4 lb

## 2019-05-22 DIAGNOSIS — F411 Generalized anxiety disorder: Secondary | ICD-10-CM | POA: Diagnosis not present

## 2019-05-22 DIAGNOSIS — L404 Guttate psoriasis: Secondary | ICD-10-CM

## 2019-05-22 MED ORDER — ALPRAZOLAM 1 MG PO TABS: ORAL_TABLET | ORAL | 0 refills | Status: DC

## 2019-05-22 MED ORDER — BUSPIRONE HCL 10 MG PO TABS: 10.0000 mg | ORAL_TABLET | Freq: Three times a day (TID) | ORAL | 2 refills | Status: DC

## 2019-05-22 MED ORDER — ALPRAZOLAM 1 MG PO TABS: 1.0000 mg | ORAL_TABLET | Freq: Four times a day (QID) | ORAL | 0 refills | Status: DC | PRN

## 2019-05-22 MED ORDER — ALPRAZOLAM 1 MG PO TABS: 1.0000 mg | ORAL_TABLET | Freq: Four times a day (QID) | ORAL | 3 refills | Status: DC | PRN

## 2019-05-22 NOTE — Progress Notes (Signed)
   Subjective:    Patient ID: Belinda [name], female    DOB: 09-16-1980, 39 y.o.   MRN: [id number]  HPI 30 minutes was spent with the patient talking about numerous issues regarding her anxiety counseling her referring her and documenting Patient arrives to discuss her Xanax prescription. Patient states in the past she has been getting the prescription filled every 28 days and that gives her 8 extra pills each month- 2 a week to use extra to help with her anxiety. Her pharmacy's new protocol is to only fill the medication every 30 days without a doctors authorization and she would like to get authorization to get her script filled every 28 days so she can have the extra pills each month.  Patien this is the next time that your meds are due on movement for 2days 28 days correct but t also has a rash on her body  Patient having dreams of her dying she is not suicidal.  She does allow herself to go ahead for counseling but does not want to take any antidepressants she denies being depressed  Patient having trouble with focus and constant worrying about everything. Review of Systems  Constitutional: Negative for activity change, appetite change and fatigue.  HENT: Negative for congestion and rhinorrhea.   Respiratory: Negative for cough and shortness of breath.   Cardiovascular: Negative for chest pain and leg swelling.  Gastrointestinal: Negative for abdominal pain and diarrhea.  Endocrine: Negative for polydipsia and polyphagia.  Skin: Negative for color change.  Neurological: Negative for dizziness and weakness.  Psychiatric/Behavioral: Negative for behavioral problems and confusion. The patient is nervous/anxious.        Objective:   Physical Exam Vitals reviewed.  Constitutional:      General: She is not in acute distress. HENT:     Head: Normocephalic and atraumatic.  Eyes:     General:        Right eye: No discharge.        Left eye: No discharge.  Neck:     Trachea: No  tracheal deviation.  Cardiovascular:     Rate and Rhythm: Normal rate and regular rhythm.     Heart sounds: Normal heart sounds. No murmur.  Pulmonary:     Effort: Pulmonary effort is normal. No respiratory distress.     Breath sounds: Normal breath sounds.  Lymphadenopathy:     Cervical: No cervical adenopathy.  Skin:    General: Skin is warm and dry.  Neurological:     Mental Status: She is alert.     Coordination: Coordination normal.  Psychiatric:        Behavior: Behavior normal.           Assessment & Plan:  Significant anxiety disorder. Patient would like to get her medications at a pharmacy where she is able to get her medicines filled a couple days early if necessary because of panic attacks causing her to take more medication than normal.  I cautioned her against this and told her that her medicine should try to last 30 days She does not want to try other medications but is willing to consider BuSpar We will start low-dose BuSpar 10 mg 3 times daily

## 2019-05-22 NOTE — Telephone Encounter (Signed)
Pt would like ALL her medications switched to [facility]. And she looked in to the Buspar and would like that called in.

## 2019-05-22 NOTE — Telephone Encounter (Signed)
Please advise. Thank you

## 2019-05-22 NOTE — Patient Instructions (Signed)

## 2019-05-23 MED ORDER — LORATADINE 10 MG PO TABS: ORAL_TABLET | ORAL | 5 refills | Status: DC

## 2019-05-23 MED ORDER — FAMOTIDINE 40 MG PO TABS: 40.0000 mg | ORAL_TABLET | Freq: Every day | ORAL | 5 refills | Status: DC

## 2019-05-23 MED ORDER — ALBUTEROL SULFATE HFA 108 (90 BASE) MCG/ACT IN AERS: INHALATION_SPRAY | RESPIRATORY_TRACT | 5 refills | Status: DC

## 2019-05-23 NOTE — Progress Notes (Signed)
Sent chronic meds to [facility]. Contacted [facility] to cancel Alprazolam scripts; [facility] states they do not have any script for pt for Alprazolam. Informed pharmacy to not fill them if they come through fax.   Pt does not have voicemail set up at this time;unable to leave message

## 2019-05-23 NOTE — Telephone Encounter (Signed)
I sent her nerve medication and BuSpar to [facility]  As for her other medicine feel free to any of her other regular medicines and then there for 6 months worth as well

## 2019-05-23 NOTE — Telephone Encounter (Signed)
Pt voicemail is full; unable to leave message

## 2019-05-23 NOTE — Addendum Note (Signed)
Addended by: Vicente Males on: 05/23/2019 09:16 AM   Modules accepted: Orders

## 2019-05-24 NOTE — Telephone Encounter (Signed)
Pt returned call. Pt states she also needing Flovent, Flonase and Clobatesol also. Chronic meds were sent in to [facility] a few day ago; prn meds were not. Pt states she is using Clobatesol daily. Pt would also like a script for Allegra because Claritin does not work for her. Pt would also like name brand only Alprazolam. Pt states [facility] filled the name brand Alprazolam one time and they worked really good. Please advise. Thank you

## 2019-05-25 MED ORDER — FLOVENT HFA 110 MCG/ACT IN AERO: INHALATION_SPRAY | RESPIRATORY_TRACT | 5 refills | Status: DC

## 2019-05-25 MED ORDER — FLUTICASONE PROPIONATE 50 MCG/ACT NA SUSP: NASAL | 5 refills | Status: DC

## 2019-05-25 MED ORDER — CLOBETASOL PROPIONATE 0.05 % EX SOLN: CUTANEOUS | 5 refills | Status: DC

## 2019-05-25 NOTE — Telephone Encounter (Signed)
1.  She may have as needed meds that she is requesting Flovent, Flonase, clobetasol 2.  Medicaid does not cover Allegra it is available over-the-counter generic at [facility] approximately $8 for 30-day supply #3 I will not do brand-name Xanax, generic Xanax is prescribed widely for millions of individuals and is what we use

## 2019-05-25 NOTE — Telephone Encounter (Signed)
Prescriptions sent electronically to pharmacy. Telephone call - no voice mail to notify patient and advise that Belinda [name] is not covered by medicaid but available OTC for 8-9 dollars a month, Also Brand name Xanax is not covered by [facility] and not recommended per Dr Nicki Reaper.

## 2019-05-25 NOTE — Telephone Encounter (Signed)
Telephone call -Voicemail not set up

## 2019-05-29 ENCOUNTER — Other Ambulatory Visit: Payer: Self-pay | Admitting: *Deleted

## 2019-05-29 MED ORDER — CETIRIZINE HCL 10 MG PO TABS: 10.0000 mg | ORAL_TABLET | Freq: Every day | ORAL | 11 refills | Status: DC

## 2019-05-29 NOTE — Telephone Encounter (Signed)
Med sent to pharm and pt was notified.  °

## 2019-05-29 NOTE — Telephone Encounter (Signed)
Discussed with pt and pt verbalized understanding and wants to know if dr scott would send in rx for zyrtec instead of allegra to see if insurance will cover that.  [facility].

## 2019-05-29 NOTE — Telephone Encounter (Signed)
She can try Zyrtec 10 mg 1 daily, #30, 11 refills if not covered there is nothing I can do about that

## 2019-05-30 ENCOUNTER — Encounter: Payer: Self-pay | Admitting: Family Medicine

## 2019-05-31 ENCOUNTER — Telehealth: Payer: Self-pay | Admitting: Family Medicine

## 2019-05-31 ENCOUNTER — Other Ambulatory Visit: Payer: Self-pay | Admitting: *Deleted

## 2019-05-31 NOTE — Telephone Encounter (Signed)
Please note in patient chart that she switched pharmacy [facility] to [facility]

## 2019-05-31 NOTE — Telephone Encounter (Signed)
Noted, [facility] from chart.

## 2019-06-05 ENCOUNTER — Encounter: Payer: Self-pay | Admitting: Family Medicine

## 2019-08-08 ENCOUNTER — Telehealth: Payer: Self-pay | Admitting: Family Medicine

## 2019-08-08 NOTE — Telephone Encounter (Signed)
Please advise. Thank you

## 2019-08-08 NOTE — Telephone Encounter (Signed)
See Dr. Lars Mage message

## 2019-08-08 NOTE — Telephone Encounter (Signed)
Patient wanted something called in for sinus infection,cough, teeth pain,ears popping explain to her must have visit before antibiotic are called in. She was referred to Urgent care she refused to go and wanting to schedule next week only had a visit on 6/23 with Dr. Lovena Le ,phone visit but she wanting something caleed in to ears popping and teeth pain. Please advise [place]

## 2019-08-08 NOTE — Telephone Encounter (Signed)
I have opened up some slots for tomorrow, she could do a phone video visit or if she does not have the capability of straight phone visit or in person visit outside

## 2019-08-09 ENCOUNTER — Other Ambulatory Visit: Payer: Self-pay

## 2019-08-09 ENCOUNTER — Telehealth (INDEPENDENT_AMBULATORY_CARE_PROVIDER_SITE_OTHER): Payer: Medicaid Other | Admitting: Family Medicine

## 2019-08-09 ENCOUNTER — Telehealth: Payer: Self-pay | Admitting: *Deleted

## 2019-08-09 DIAGNOSIS — J019 Acute sinusitis, unspecified: Secondary | ICD-10-CM | POA: Diagnosis not present

## 2019-08-09 MED ORDER — AZITHROMYCIN 250 MG PO TABS: ORAL_TABLET | ORAL | 0 refills | Status: DC

## 2019-08-09 NOTE — Progress Notes (Signed)
   Subjective:    Patient ID: Belinda [name], female    DOB: 09/06/1980, 39 y.o.   MRN: [id number]  Sinus Problem This is a new problem. The current episode started in the past 7 days. Associated symptoms include congestion, coughing and ear pain. (Bad smell)    Head congestion sinus pressure denies high fever chills sweats wheezing difficulty breathing  Review of Systems  HENT: Positive for congestion and ear pain.   Respiratory: Positive for cough.    Virtual Visit via Video Note  I connected with Belinda [name] on 08/09/19 at  8:40 AM EDT by a video enabled telemedicine application and verified that I am speaking with the correct person using two identifiers.  Location: Patient: home Provider: office   I discussed the limitations of evaluation and management by telemedicine and the availability of in person appointments. The patient expressed understanding and agreed to proceed.  History of Present Illness:    Observations/Objective:   Assessment and Plan:   Follow Up Instructions:    I discussed the assessment and treatment plan with the patient. The patient was provided an opportunity to ask questions and all were answered. The patient agreed with the plan and demonstrated an understanding of the instructions.   The patient was advised to call back or seek an in-person evaluation if the symptoms worsen or if the condition fails to improve as anticipated.  I provided 15 minutes of non-face-to-face time during this encounter.         Objective:   Physical Exam   Today's visit was via telephone Physical exam was not possible for this visit      Assessment & Plan:  Acute rhinosinusitis Antibiotics prescribed warning signs discussed Follow-up if ongoing troubles or problems Likelihood of Covid is low but if symptoms worsen recommend testing

## 2019-08-09 NOTE — Telephone Encounter (Signed)
[name], lavalle are scheduled for a virtual visit with your provider today.    Just as we do with appointments in the office, we must obtain your consent to participate.  Your consent will be active for this visit and any virtual visit you may have with one of our providers in the next 365 days.    If you have a MyChart account, I can also send a copy of this consent to you electronically.  All virtual visits are billed to your insurance company just like a traditional visit in the office.  As this is a virtual visit, video technology does not allow for your provider to perform a traditional examination.  This may limit your provider's ability to fully assess your condition.  If your provider identifies any concerns that need to be evaluated in person or the need to arrange testing such as labs, EKG, etc, we will make arrangements to do so.    Although advances in technology are sophisticated, we cannot ensure that it will always work on either your end or our end.  If the connection with a video visit is poor, we may have to switch to a telephone visit.  With either a video or telephone visit, we are not always able to ensure that we have a secure connection.   I need to obtain your verbal consent now.   Are you willing to proceed with your visit today?   Belinda [name] has provided verbal consent on 08/09/2019 for a virtual visit (video or telephone).   Mitzie Na, RN 08/09/2019  8:35 AM

## 2019-08-15 ENCOUNTER — Telehealth: Payer: Medicaid Other | Admitting: Family Medicine

## 2019-08-29 ENCOUNTER — Ambulatory Visit: Payer: Medicaid Other | Admitting: Family Medicine

## 2019-08-31 ENCOUNTER — Other Ambulatory Visit: Payer: Self-pay | Admitting: Gastroenterology

## 2019-08-31 ENCOUNTER — Other Ambulatory Visit: Payer: Self-pay | Admitting: Family Medicine

## 2019-09-03 ENCOUNTER — Telehealth: Payer: Self-pay

## 2019-09-03 NOTE — Telephone Encounter (Signed)
Refill request for Dexilant 60 mg one capsule daily. Please send to [facility].

## 2019-09-04 MED ORDER — DEXILANT 60 MG PO CPDR: 60.0000 mg | DELAYED_RELEASE_CAPSULE | Freq: Every day | ORAL | 11 refills | Status: DC

## 2019-09-04 NOTE — Telephone Encounter (Signed)
Dr.Rourk called- he spoke with this pt last night about her dexilant. Ok to send in rx refills. Rx has been sent to [place]. Pt needs follow up ov in the office.   Stacey, please schedule follow up ov with extender. Thanks.

## 2019-09-04 NOTE — Addendum Note (Signed)
Addended by: Claudina Lick on: 09/04/2019 08:18 AM   Modules accepted: Orders

## 2019-09-17 ENCOUNTER — Telehealth: Payer: Self-pay | Admitting: Family Medicine

## 2019-09-17 NOTE — Telephone Encounter (Signed)
Pt wants to talk to the nurse about a couple things. She didn't disclose what she wanted to talk about.

## 2019-09-17 NOTE — Telephone Encounter (Signed)
Tc - no voicemail/no answer.

## 2019-09-17 NOTE — Telephone Encounter (Signed)
Patient calling requesting refill on Xanax to [facility] due to be filled thursday-- she has an appointment in August.  Patient states she was given zpak for sinus infection prior to her oral surgery and now is experiencing sinus headache pain, eyes are burning and itching and ears popping. Taking allegra and using allergy eye drops but nothing is helping. She mentioned this has happened in the past after having teeth pulled as well. She states she does not feel comfortable coming in the office for an appointment this soon after her surgery and is concerned about catching something.

## 2019-09-18 ENCOUNTER — Other Ambulatory Visit: Payer: Self-pay | Admitting: Family Medicine

## 2019-09-18 MED ORDER — ALPRAZOLAM 1 MG PO TABS: 1.0000 mg | ORAL_TABLET | Freq: Four times a day (QID) | ORAL | 2 refills | Status: DC | PRN

## 2019-09-18 NOTE — Telephone Encounter (Signed)
Refill of Xanax was sent in As for the infection I recommend patient to be seen If no available slots urgent care

## 2019-09-18 NOTE — Telephone Encounter (Signed)
Patient notified of Dr. Bary Leriche recommendation and verbalized understanding. She will see how she feels and call back to schedule an appointment when her schedule permits.

## 2019-09-20 ENCOUNTER — Telehealth: Payer: Self-pay | Admitting: Family Medicine

## 2019-09-20 NOTE — Telephone Encounter (Signed)
Attempted to contact patient. Pt does not have voicemail set up; unable to leave voicemail

## 2019-09-20 NOTE — Telephone Encounter (Signed)
Pt called about ALPRAZolam Duanne Moron) 1 MG tablet  Would not discuss with me she wants to talk to Dr Nicki Reaper nurse.  Pt call back [phone]

## 2019-09-28 ENCOUNTER — Other Ambulatory Visit: Payer: Self-pay | Admitting: Family Medicine

## 2019-09-28 NOTE — Telephone Encounter (Signed)
No -patient states she will get new script at her office visit with you 10/16/19

## 2019-09-28 NOTE — Telephone Encounter (Signed)
05/22/19 was last med checkup

## 2019-09-28 NOTE — Telephone Encounter (Signed)
Patient states she will discuss it further at her office visit but the pharmacy needs it wrote on her future xanax scripts that she can fill her xanax every 28-29 days so they will fill it correctly for her

## 2019-09-28 NOTE — Telephone Encounter (Signed)
Question Do I need to send in a new prescription?

## 2019-09-28 NOTE — Telephone Encounter (Signed)
Telephone call -voicemail is not set up

## 2019-10-11 ENCOUNTER — Telehealth (INDEPENDENT_AMBULATORY_CARE_PROVIDER_SITE_OTHER): Payer: Medicaid Other | Admitting: Family Medicine

## 2019-10-11 ENCOUNTER — Other Ambulatory Visit: Payer: Self-pay

## 2019-10-11 DIAGNOSIS — J019 Acute sinusitis, unspecified: Secondary | ICD-10-CM | POA: Diagnosis not present

## 2019-10-11 MED ORDER — AMOXICILLIN 500 MG PO TABS
500.0000 mg | ORAL_TABLET | Freq: Three times a day (TID) | ORAL | 0 refills | Status: DC
Start: 2019-10-11 — End: 2019-11-07

## 2019-10-11 NOTE — Progress Notes (Signed)
   Subjective:    Patient ID: Belinda [name], female    DOB: [birth year], 39 y.o.   MRN: [id number]  Sinusitis This is a new problem. Episode onset: 3 weeks. Associated symptoms include congestion, coughing, a hoarse voice and a sore throat. (Fever) Treatments tried: mucinex, advil, tylenol, allegra.  Relates a lot of head congestion drainage coughing denies any severe pain but relates some hoarseness no shortness of breath Virtual Visit via Telephone Note  I connected with Belinda [name] on 10/11/19 at  4:10 PM EDT by telephone and verified that I am speaking with the correct person using two identifiers.  Location: Patient: home Provider: office   I discussed the limitations, risks, security and privacy concerns of performing an evaluation and management service by telephone and the availability of in person appointments. I also discussed with the patient that there may be a patient responsible charge related to this service. The patient expressed understanding and agreed to proceed.   History of Present Illness:    Observations/Objective:   Assessment and Plan:   Follow Up Instructions:    I discussed the assessment and treatment plan with the patient. The patient was provided an opportunity to ask questions and all were answered. The patient agreed with the plan and demonstrated an understanding of the instructions.   The patient was advised to call back or seek an in-person evaluation if the symptoms worsen or if the condition fails to improve as anticipated.  I provided 20 minutes of non-face-to-face time during this encounter. Including documentation    Review of Systems  HENT: Positive for congestion, hoarse voice and sore throat.   Respiratory: Positive for cough.        Objective:   Physical Exam   Today's visit was via telephone Physical exam was not possible for this visit      Assessment & Plan:  Acute rhinosinusitis antibiotic prescribed warning  signs discussed Covid testing recommended follow-up if any ongoing troubles

## 2019-10-15 ENCOUNTER — Telehealth: Payer: Self-pay | Admitting: Family Medicine

## 2019-10-15 NOTE — Telephone Encounter (Signed)
Error

## 2019-10-16 ENCOUNTER — Other Ambulatory Visit: Payer: Self-pay

## 2019-10-16 ENCOUNTER — Telehealth (INDEPENDENT_AMBULATORY_CARE_PROVIDER_SITE_OTHER): Payer: Medicaid Other | Admitting: Family Medicine

## 2019-10-16 DIAGNOSIS — F411 Generalized anxiety disorder: Secondary | ICD-10-CM | POA: Diagnosis not present

## 2019-10-16 DIAGNOSIS — J019 Acute sinusitis, unspecified: Secondary | ICD-10-CM

## 2019-10-16 MED ORDER — CEFDINIR 300 MG PO CAPS: 300.0000 mg | ORAL_CAPSULE | Freq: Two times a day (BID) | ORAL | 0 refills | Status: DC

## 2019-10-16 MED ORDER — ALPRAZOLAM 1 MG PO TABS
1.0000 mg | ORAL_TABLET | Freq: Four times a day (QID) | ORAL | 5 refills | Status: DC | PRN
Start: 2019-10-16 — End: 2020-03-17

## 2019-10-16 NOTE — Progress Notes (Signed)
   Subjective:    Patient ID: Belinda [name], female    DOB: [birth year], 39 y.o.   MRN: [id number]  HPI med check up.   Pt seen on 8/19 for sinusitis. States she is still feeling bad. Congestion. Thick green mucus Pt states she did not get covid test because of transporation issues and she did not really want to know if she was positive.  Pt wants to know if she should be taking any vitamins. Pt is taking amoxil.  Patient with recent upper respiratory possible sinusitis on antibiotics but I am also concerned about the possibility of underlying viral highly recommended for the patient to go get evaluated for Covid she will get tested later this week she states she has no transportation currently but denies any major setbacks currently Virtual Visit via Telephone Note  I connected with Belinda [name] on 10/16/19 at  1:40 PM EDT by telephone and verified that I am speaking with the correct person using two identifiers.  Location: Patient: home Provider: office   I discussed the limitations, risks, security and privacy concerns of performing an evaluation and management service by telephone and the availability of in person appointments. I also discussed with the patient that there may be a patient responsible charge related to this service. The patient expressed understanding and agreed to proceed.   History of Present Illness:    Observations/Objective:   Assessment and Plan:   Follow Up Instructions:    I discussed the assessment and treatment plan with the patient. The patient was provided an opportunity to ask questions and all were answered. The patient agreed with the plan and demonstrated an understanding of the instructions.   The patient was advised to call back or seek an in-person evaluation if the symptoms worsen or if the condition fails to improve as anticipated.  I provided 20 minutes of non-face-to-face time during this encounter.      Review of Systems       Objective:   Physical Exam    Today's visit was via telephone Physical exam was not possible for this visit     Assessment & Plan:  Anxiety Refill of medication This patient has had lifelong anxiety during her adulthood. She has been on Xanax as long as I have known her.  Takes 4/day.  We have told her we are not going up on the dosage.  Patient tried BuSpar previously and states she did not tolerate it.  Patient has tried SSRIs and did not tolerate it.  Have encouraged patient to do counseling in the past she has deferred on this due to transportation issues.  Follow-up within 5 to 6 months Recommend Covid testing warning signs were discussed in detail

## 2019-10-25 ENCOUNTER — Other Ambulatory Visit: Payer: Self-pay | Admitting: Family Medicine

## 2019-11-07 ENCOUNTER — Other Ambulatory Visit: Payer: Self-pay | Admitting: *Deleted

## 2019-11-07 ENCOUNTER — Encounter: Payer: Self-pay | Admitting: Nurse Practitioner

## 2019-11-07 ENCOUNTER — Encounter: Payer: Self-pay | Admitting: Internal Medicine

## 2019-11-07 ENCOUNTER — Other Ambulatory Visit: Payer: Self-pay

## 2019-11-07 ENCOUNTER — Ambulatory Visit: Payer: Self-pay | Admitting: Nurse Practitioner

## 2019-11-07 ENCOUNTER — Ambulatory Visit: Payer: Medicaid Other | Admitting: Nurse Practitioner

## 2019-11-07 VITALS — BP 124/84 | HR 80 | Temp 97.5°F | Ht 61.0 in | Wt 226.0 lb

## 2019-11-07 DIAGNOSIS — R140 Abdominal distension (gaseous): Secondary | ICD-10-CM

## 2019-11-07 DIAGNOSIS — K5900 Constipation, unspecified: Secondary | ICD-10-CM | POA: Diagnosis not present

## 2019-11-07 DIAGNOSIS — K219 Gastro-esophageal reflux disease without esophagitis: Secondary | ICD-10-CM | POA: Diagnosis not present

## 2019-11-07 MED ORDER — DOCUSATE SODIUM 100 MG PO CAPS: ORAL_CAPSULE | ORAL | 2 refills | Status: DC

## 2019-11-07 NOTE — Progress Notes (Signed)
Referring Provider: Kathyrn Drown, MD Primary Care Physician:  Kathyrn Drown, MD Primary GI:  Dr. Gala Romney  Chief Complaint  Patient presents with  . Medication Refill    HPI:   Belinda [name] is a 39 y.o. female who presents for follow-up and medication refills.  The patient was last seen in our office 06/28/2018 for GERD, abdominal pain, constipation.  Her last visit was a virtual office visit due to COVID-19/coronavirus pandemic.  At that time noted substernal pain with distention, sometimes severe.  Associated with nausea and 1-2 episodes of vomiting during the night as well as some dysphagia to solid foods and frequent regurgitation.  Feels like her mouth is extremely dry.  Pain sometimes worse with meals.  Feels like Carafate helps, takes PPI.  Noted constipation occasionally 2 weeks between bowel movements for which she uses an over-the-counter laxative.  MiraLAX made her constipation worse previously.  Remote history of peptic ulcer disease in 2004 on chronic PPI.  Uses NSAIDs (Advil) for headaches as needed but recently stopped due to her stomach problems.  Avoids triggers.    In February 2020 she had an abdominal ultrasound with cholelithiasis but no evidence of acute cholecystitis, possible fatty liver, HIDA scan normal, H. pylori IgG negative, LFTs, lipase normal.  CBC normal.  At her last visit recommended Linzess 290 mcg daily for constipation, optimize reflux medications by increasing pantoprazole to 40 mg twice daily, Carafate up to 4 times a day.  Move toward EGD with propofol in a couple weeks, may need to see surgery for consideration of cholecystectomy.  EGD was completed 08/03/2018 which found normal esophagus, normal stomach, normal duodenum.  Recommended stopping Carafate, Protonix, and omeprazole and trial 2-week course of Dexilant samples provided.  May need to approach cholecystectomy as a diagnostic maneuver pending response to Dexilant.  She called our office about  a week after her EGD stating Dexilant is working but Linzess is not.  A prescription for Dexilant was sent to her pharmacy.  Recommended trial of Amitiza 24 mcg twice daily with an Rx sent to her pharmacy.  No further communication from the patient other than to request refill of Dexilant.  Today she states she doing okay overall. Dexilant was refilled and works well for her, thinks she has enough refills. Linzess 290 mcg works well for her, but not quite effective enough. Has been adding OTC laxative to help as well. Has tried "OTC chocolate laxative." She has tried MiraLAX and didn't like it ("blocked me up"). Typically takes 2 stimulant laxative. Been a long time since trying colace stools softener. She has lost 15 lbs intentionally through diet and exercise. Has bloating with constipation, occasional cramps. No overt abdominal pain, N/V, hematochezia, melena, fever, chills, unintentional weight loss. Denies URI or flu-like symptoms. Denies loss of sense of taste or smell. The patient has received COVID-19 vaccination(s). They are not interested in vaccine scheduling information. States she has already identified where she'll get vaccinated but hasn't been well enough to get the vaccine. Denies chest pain, dyspnea, dizziness, lightheadedness, syncope, near syncope. Denies any other upper or lower GI symptoms.  She's interested in quitting smoking and knows how to quit, but isn't ready yet.  Past Medical History:  Diagnosis Date  . Anxiety    compulsive worring, phobia  . Anxiety   . Asthma   . Bipolar affective ([place])   . Fatty liver 11/14/2018  . Narcotic addiction ([place])   . Psoriasis   .  Reactive airways dysfunction syndrome ([place])   . Vaginal Pap smear, abnormal     Past Surgical History:  Procedure Laterality Date  . CESAREAN SECTION     twice  . COLPOSCOPY    . DILATION AND CURETTAGE OF UTERUS    . ESOPHAGOGASTRODUODENOSCOPY (EGD) WITH PROPOFOL N/A 08/03/2018   Procedure:  ESOPHAGOGASTRODUODENOSCOPY (EGD) WITH PROPOFOL;  Surgeon: Daneil Dolin, MD;  Location: [facility];  Service: Endoscopy;  Laterality: N/A;  11:00am  . PID      Current Outpatient Medications  Medication Sig Dispense Refill  . albuterol (PROVENTIL) (2.5 MG/3ML) 0.083% nebulizer solution Take 2.5 mg by nebulization every 6 (six) hours as needed for wheezing or shortness of breath.    Marland Kitchen albuterol (VENTOLIN HFA) 108 (90 Base) MCG/ACT inhaler INHALE 2 PUFFS INTO THE LUNGS EVERY 6 HOURS AS NEEDED FOR WHEEZING. 8.5 g 3  . ALPRAZolam (XANAX) 1 MG tablet Take 1 tablet (1 mg total) by mouth 4 (four) times daily as needed. 120 tablet 5  . cetirizine (ZYRTEC) 10 MG tablet Take 1 tablet (10 mg total) by mouth daily. 30 tablet 11  . clobetasol (TEMOVATE) 0.05 % external solution APPLY EXTERNALLY TO THE AFFECTED AREA TWICE A DAY AS NEEDED. 50 mL 2  . DEXILANT 60 MG capsule TAKE 1 CAPSULE(60 MG) BY MOUTH DAILY BEFORE BREAKFAST 30 capsule 11  . dexlansoprazole (DEXILANT) 60 MG capsule Take 1 capsule (60 mg total) by mouth daily. 30 capsule 11  . famotidine (PEPCID) 40 MG tablet Take 1 tablet (40 mg total) by mouth daily. 30 tablet 5  . fluticasone (FLONASE) 50 MCG/ACT nasal spray SHAKE LIQUID AND USE 2 SPRAYS IN EACH NOSTRIL DAILY 16 g 5  . fluticasone (FLOVENT HFA) 110 MCG/ACT inhaler 1 puffs twice daily for asthma 1 Inhaler 5  . gabapentin (NEURONTIN) 300 MG capsule Take 300 mg by mouth as needed.    Marland Kitchen LINZESS 290 MCG CAPS capsule TAKE 1 CAPSULE(290 MCG) BY MOUTH DAILY BEFORE BREAKFAST 90 capsule 3  . docusate sodium (COLACE) 100 MG capsule One daily for 2 weeks. Can increase to twice daily after that 60 capsule 2   No current facility-administered medications for this visit.    Allergies as of 11/07/2019 - Review Complete 11/07/2019  Allergen Reaction Noted  . Celexa [citalopram hydrobromide]  06/24/2016  . Wellbutrin [bupropion]  09/01/2015  . Zoloft [sertraline hcl]  06/28/2018  . Augmentin  [amoxicillin-pot clavulanate]  05/12/2012  . Doxycycline Rash 05/17/2012    Family History  Problem Relation Age of Onset  . Hypertension Father   . Heart attack Father   . Stroke Father   . Heart disease Maternal Grandmother   . Hypertension Brother   . Breast cancer Maternal Aunt   . Colon cancer Neg Hx     Social History   Socioeconomic History  . Marital status: Legally Separated    Spouse name: Not on file  . Number of children: 2  . Years of education: Not on file  . Highest education level: Not on file  Occupational History  . Not on file  Tobacco Use  . Smoking status: Current Some Day Smoker    Packs/day: 0.50    Types: Cigarettes  . Smokeless tobacco: Never Used  . Tobacco comment: light smoker 5 cigs daily  Vaping Use  . Vaping Use: Never used  Substance and Sexual Activity  . Alcohol use: No  . Drug use: No    Comment: Denies  . Sexual activity:  Yes    Birth control/protection: Pill  Other Topics Concern  . Not on file  Social History Narrative  . Not on file   Social Determinants of Health   Financial Resource Strain:   . Difficulty of Paying Living Expenses: Not on file  Food Insecurity:   . Worried About Charity fundraiser in the Last Year: Not on file  . Ran Out of Food in the Last Year: Not on file  Transportation Needs:   . Lack of Transportation (Medical): Not on file  . Lack of Transportation (Non-Medical): Not on file  Physical Activity:   . Days of Exercise per Week: Not on file  . Minutes of Exercise per Session: Not on file  Stress:   . Feeling of Stress : Not on file  Social Connections:   . Frequency of Communication with Friends and Family: Not on file  . Frequency of Social Gatherings with Friends and Family: Not on file  . Attends Religious Services: Not on file  . Active Member of Clubs or Organizations: Not on file  . Attends Archivist Meetings: Not on file  . Marital Status: Not on file     Subjective: Review of Systems  Constitutional: Negative for chills, fever, malaise/fatigue and weight loss.  HENT: Negative for congestion and sore throat.   Respiratory: Negative for cough and shortness of breath.   Cardiovascular: Negative for chest pain and palpitations.  Gastrointestinal: Positive for constipation. Negative for abdominal pain, blood in stool, diarrhea, melena, nausea and vomiting.  Musculoskeletal: Negative for joint pain and myalgias.  Skin: Negative for rash.  Neurological: Negative for dizziness and weakness.  Endo/Heme/Allergies: Does not bruise/bleed easily.  Psychiatric/Behavioral: Negative for depression. The patient is nervous/anxious.   All other systems reviewed and are negative.    Objective: BP 124/84   Pulse 80   Temp (!) 97.5 F (36.4 C) (Temporal)   Ht 5' 1"  (1.549 m)   Wt 226 lb (102.5 kg)   LMP  (Within Months) Comment: only one period this year.   BMI 42.70 kg/m  Physical Exam Vitals and nursing note reviewed.  Constitutional:      General: She is not in acute distress.    Appearance: Normal appearance. She is well-developed. She is not ill-appearing, toxic-appearing or diaphoretic.  HENT:     Head: Normocephalic and atraumatic.     Nose: No congestion or rhinorrhea.  Eyes:     General: No scleral icterus. Cardiovascular:     Rate and Rhythm: Normal rate and regular rhythm.     Heart sounds: Normal heart sounds.  Pulmonary:     Effort: Pulmonary effort is normal. No respiratory distress.     Breath sounds: Normal breath sounds.  Abdominal:     General: Bowel sounds are normal.     Palpations: Abdomen is soft. There is no hepatomegaly, splenomegaly or mass.     Tenderness: There is no abdominal tenderness. There is no guarding or rebound.     Hernia: No hernia is present.  Skin:    General: Skin is warm and dry.     Coloration: Skin is not jaundiced.     Findings: No rash.  Neurological:     General: No focal deficit  present.     Mental Status: She is alert and oriented to person, place, and time.  Psychiatric:        Attention and Perception: Attention normal.        Mood and Affect:  Mood normal.        Speech: Speech is rapid and pressured.        Behavior: Behavior is hyperactive.        Thought Content: Thought content normal.        Cognition and Memory: Cognition and memory normal.      Assessment:  39 year old female who presents for follow-up on constipation and GERD as well as for refills.  The patient seems to have quite a burden of anxiety and likely ADHD.  During the visit she required frequent redirection and refocusing.  She has been on [place] for GERD for some time and this works well for her.  She recently received a refill and think she has enough but will call us if she needs more.  Overall her symptoms seem more controlled  Constipation: Not ideally controlled, currently on Linzess 290 mcg daily.  She is asking about doubling this dose which I advised against it because it is the top.  Dose.  She has tried MiraLAX previously but has not helped much.  She is currently using over-the-counter "chocolate flavored laxatives" to help for breakthrough constipation.  Have advised Colace addition as an initial attempt to improve her constipation.  Currently having a bowel movement about once a week.  Weight loss: She has made a great attempt at weight loss and is down about 15 pounds compared to her last primary care visit.  I congratulated her on her efforts and her success.  Per her request, I will refer her to the medical weight loss clinic in [facility] for additional support on her weight loss journey   Plan: 1. Continue Dexilant daily 2. Continue Linzess 290 mcg daily 3. Add Colace 100 mg once a day for 2 weeks 4. If needed, can increase Colace to 100 mg twice daily for 2 additional weeks 5. Continue with whichever dose is effective 6. If no improvement after 4 weeks, notify our  office and we can make further recommendations 7. Referral to medical weight loss clinic 8. Return for follow-up in 3 months.    Thank you for allowing [place] to participate in the care of Leonides Schanz, DNP, AGNP-C Adult & Gerontological Nurse Practitioner [facility]   11/07/2019 10:25 AM   Disclaimer: This note was dictated with voice recognition software. Similar sounding words can inadvertently be transcribed and may not be corrected upon review.

## 2019-11-07 NOTE — Patient Instructions (Addendum)
Your health issues we discussed today were:   GERD (reflux/heartburn): 1. I am glad that Dexilant is working well for you 2. Continue taking Dexilant once a day as you have been 3. Call us if you need further refills  Constipation: 1. Continue taking Linzess 290 mcg once a day 2. Start taking Colace 100 mg once a day, in the morning 3. If no significant improvement in your constipation on Colace, you can increase this to 100 mg twice a day (in the morning and in the evening) 4. If still no improvement after 2 additional weeks taking it twice a day, notify our office for further recommendations  Weight loss: 1. Congratulations on your weight loss successes! 2. Per your request in our discussion I will refer you to the medical weight loss clinic in [place]  Overall I recommend:  1. Continue your other current medications 2. Return for follow-up in 3 months 3. Call us for any questions or concerns   At [facility] we value your feedback. You may receive a survey about your visit today. Please share your experience as we strive to create trusting relationships with our patients to provide genuine, compassionate, quality care.  We appreciate your understanding and patience as we review any laboratory studies, imaging, and other diagnostic tests that are ordered as we care for you. Our office policy is 5 business days for review of these results, and any emergent or urgent results are addressed in a timely manner for your best interest. If you do not hear from our office in 1 week, please contact us.   We also encourage the use of MyChart, which contains your medical information for your review as well. If you are not enrolled in this feature, an access code is on this after visit summary for your convenience. Thank you for allowing [place] to be involved in your care.  It was great to see you today!  I hope you have a great rest of your summer!!

## 2019-11-07 NOTE — Progress Notes (Signed)
Cc'ed to pcp °

## 2019-11-14 ENCOUNTER — Other Ambulatory Visit: Payer: Self-pay | Admitting: Nurse Practitioner

## 2019-11-14 ENCOUNTER — Telehealth: Payer: Self-pay | Admitting: Family Medicine

## 2019-11-14 ENCOUNTER — Other Ambulatory Visit: Payer: Self-pay | Admitting: *Deleted

## 2019-11-14 ENCOUNTER — Telehealth: Payer: Self-pay | Admitting: Internal Medicine

## 2019-11-14 ENCOUNTER — Other Ambulatory Visit: Payer: Self-pay | Admitting: Gastroenterology

## 2019-11-14 ENCOUNTER — Other Ambulatory Visit: Payer: Self-pay | Admitting: Family Medicine

## 2019-11-14 DIAGNOSIS — K5900 Constipation, unspecified: Secondary | ICD-10-CM

## 2019-11-14 MED ORDER — CLOBETASOL PROPIONATE 0.05 % EX SOLN
CUTANEOUS | 2 refills | Status: DC
Start: 2019-11-14 — End: 2019-12-04

## 2019-11-14 MED ORDER — LINACLOTIDE 290 MCG PO CAPS: 290.0000 ug | ORAL_CAPSULE | Freq: Every day | ORAL | 3 refills | Status: DC

## 2019-11-14 NOTE — Telephone Encounter (Signed)
Pt called today saying that she had office visit on 9/15 with Walden Field, NP and her pharmacy hasn't gotten anything from [place] regarding Linzess. She said she was switching pharmacies and prescription needs to go to [facility]. Then she had another questions about other medications. Please call her at [phone]

## 2019-11-14 NOTE — Telephone Encounter (Signed)
Med sent to pharmacy. Patient notified 

## 2019-11-14 NOTE — Telephone Encounter (Signed)
Appears it was already refilled by Roseanne Kaufman, NP today but sent to [facility] (likely from Rx Refill box). I will send to [place] now.

## 2019-11-14 NOTE — Telephone Encounter (Signed)
To my knowledge this is the strength that comes in May send in 2 bottles with 3 refills

## 2019-11-14 NOTE — Addendum Note (Signed)
Addended by: Gordy Levan, [place] Kadlec A on: 11/14/2019 02:36 PM   Modules accepted: Orders

## 2019-11-14 NOTE — Telephone Encounter (Signed)
Noted  

## 2019-11-14 NOTE — Telephone Encounter (Signed)
Patient is requesting refill on clobetasol 0.5 mg and she requesting 2 bottles instead of one and wanting to know if you can increase the percent also. Please advise [place]

## 2019-11-14 NOTE — Telephone Encounter (Signed)
Spoke with pt. Pt needs a refill of Linzess 290 mcg sent to [facility] .

## 2019-11-21 ENCOUNTER — Other Ambulatory Visit: Payer: Self-pay | Admitting: Family Medicine

## 2019-12-04 ENCOUNTER — Telehealth: Payer: Self-pay

## 2019-12-04 DIAGNOSIS — L404 Guttate psoriasis: Secondary | ICD-10-CM

## 2019-12-04 MED ORDER — CLOBETASOL PROPIONATE 0.05 % EX SOLN: CUTANEOUS | 4 refills | Status: DC

## 2019-12-04 NOTE — Telephone Encounter (Signed)
Please advise. Thank you

## 2019-12-04 NOTE — Telephone Encounter (Signed)
Needs more clobetasol (TEMOVATE) 0.05 %  because she is using a bottle a week.  Needs double the amount to cover all the psoriasis on her body.  Marty   Also needs referral to dermatology

## 2019-12-04 NOTE — Telephone Encounter (Signed)
Refills sent in. Left message to return call

## 2019-12-04 NOTE — Telephone Encounter (Signed)
4 rf

## 2019-12-05 NOTE — Telephone Encounter (Signed)
Patient notified and verbalized understanding. 

## 2019-12-12 ENCOUNTER — Other Ambulatory Visit: Payer: Self-pay | Admitting: Nurse Practitioner

## 2019-12-12 NOTE — Telephone Encounter (Signed)
10/16/19 last visit

## 2019-12-25 ENCOUNTER — Other Ambulatory Visit: Payer: Self-pay | Admitting: Family Medicine

## 2020-01-23 DIAGNOSIS — U099 Post covid-19 condition, unspecified: Secondary | ICD-10-CM

## 2020-01-23 HISTORY — DX: Post covid-19 condition, unspecified: U09.9

## 2020-01-24 ENCOUNTER — Other Ambulatory Visit: Payer: Self-pay | Admitting: Family Medicine

## 2020-01-25 ENCOUNTER — Telehealth: Payer: Self-pay | Admitting: Family Medicine

## 2020-01-25 ENCOUNTER — Other Ambulatory Visit: Payer: Self-pay

## 2020-01-25 ENCOUNTER — Telehealth (INDEPENDENT_AMBULATORY_CARE_PROVIDER_SITE_OTHER): Payer: Medicaid Other | Admitting: Family Medicine

## 2020-01-25 DIAGNOSIS — J019 Acute sinusitis, unspecified: Secondary | ICD-10-CM

## 2020-01-25 DIAGNOSIS — B9689 Other specified bacterial agents as the cause of diseases classified elsewhere: Secondary | ICD-10-CM | POA: Diagnosis not present

## 2020-01-25 MED ORDER — CEFDINIR 300 MG PO CAPS: 300.0000 mg | ORAL_CAPSULE | Freq: Two times a day (BID) | ORAL | 0 refills | Status: DC

## 2020-01-25 NOTE — Progress Notes (Signed)
Pt having sinus pressure, sinus headache, watery eyes and bad taste in mouth. Pt had open socket in mouth and this morning it began to drain pus. Pt had top teeth pulled recently. No fever, felt tired the past week (but thinks its due to Thanksgiving). Pt was around a bunch of people recently. Pt did wear a mask.  Pt is needing refill on Flonase also.   Virtual Visit via Telephone Note  I connected with Belinda [name] on 01/25/20 at  2:00 PM EST by telephone and verified that I am speaking with the correct person using two identifiers.  Location: Patient: home Provider: office   I discussed the limitations, risks, security and privacy concerns of performing an evaluation and management service by telephone and the availability of in person appointments. I also discussed with the patient that there may be a patient responsible charge related to this service. The patient expressed understanding and agreed to proceed.   History of Present Illness:    Observations/Objective:   Assessment and Plan:   Follow Up Instructions:    I discussed the assessment and treatment plan with the patient. The patient was provided an opportunity to ask questions and all were answered. The patient agreed with the plan and demonstrated an understanding of the instructions.   The patient was advised to call back or seek an in-person evaluation if the symptoms worsen or if the condition fails to improve as anticipated.  I provided 22 minutes of non-face-to-face time during this encounter.        Patient ID: Belinda [name], female    DOB: 01-Apr-1980, 39 y.o.   MRN: [id number]   Chief Complaint  Patient presents with  . Sinusitis   Subjective:  CC: "sinus infection"   Presents today via telephone visit.  This is a new problem.  Reports "sinus infection", had teeth removed in June, still having issues with that.  Reports a bad taste in her mouth, husband and child sick, she reports she is not  as sick as they are.  Yesterday she noted pus coming out of her sinus cavity.  Associated symptoms include watery eyes mostly on the right, congestion, ear pain on the right and face pain and pressure in the sinus area.  Pertinent negatives include no fever, no chills, no headache, no sore throat, just a dry throat.    Medical History Aijah has a past medical history of Anxiety, Anxiety, Asthma, Bipolar affective ([place]), Fatty liver (11/14/2018), Narcotic addiction ([place]), Psoriasis, Reactive airways dysfunction syndrome ([place]), and Vaginal Pap smear, abnormal.   Outpatient Encounter Medications as of 01/25/2020  Medication Sig  . albuterol (PROVENTIL) (2.5 MG/3ML) 0.083% nebulizer solution Take 2.5 mg by nebulization every 6 (six) hours as needed for wheezing or shortness of breath.  Marland Kitchen albuterol (VENTOLIN HFA) 108 (90 Base) MCG/ACT inhaler INHALE 2 PUFFS INTO THE LUNGS EVERY 6 HOURS AS NEEDED FOR WHEEZING.  . ALPRAZolam (XANAX) 1 MG tablet Take 1 tablet (1 mg total) by mouth 4 (four) times daily as needed.  . cetirizine (ZYRTEC) 10 MG tablet Take 1 tablet (10 mg total) by mouth daily.  . clobetasol (TEMOVATE) 0.05 % external solution APPLY EXTERNALLY TO THE AFFECTED AREA TWICE A DAY AS NEEDED.  Marland Kitchen DEXILANT 60 MG capsule TAKE 1 CAPSULE(60 MG) BY MOUTH DAILY BEFORE BREAKFAST  . dexlansoprazole (DEXILANT) 60 MG capsule Take 1 capsule (60 mg total) by mouth daily.  Marland Kitchen docusate sodium (COLACE) 100 MG capsule One daily for 2 weeks. Can  increase to twice daily after that  . famotidine (PEPCID) 40 MG tablet TAKE 1 TABLET ONCE DAILY.  Marland Kitchen FLOVENT HFA 110 MCG/ACT inhaler INHALE 1 PUFF TWICE A DAY FOR ASTHMA.  . fluticasone (FLONASE) 50 MCG/ACT nasal spray SHAKE LIQUID AND USE 2 SPRAYS IN EACH NOSTRIL DAILY  . gabapentin (NEURONTIN) 300 MG capsule Take 300 mg by mouth as needed.  . linaclotide (LINZESS) 290 MCG CAPS capsule Take 1 capsule (290 mcg total) by mouth daily before breakfast.  . LINZESS 290 MCG CAPS  capsule TAKE 1 CAPSULE BEFORE BREAKFAST.  . cefdinir (OMNICEF) 300 MG capsule Take 1 capsule (300 mg total) by mouth 2 (two) times daily.   No facility-administered encounter medications on file as of 01/25/2020.     Review of Systems  Constitutional: Negative for chills and fever.  HENT: Positive for congestion, ear pain, sinus pressure and sinus pain. Negative for sore throat.        Face pain and watery eyes. Having issue with tooth from a previous dental procedure in June.   Respiratory: Negative for shortness of breath.   Cardiovascular: Negative for chest pain.  Gastrointestinal: Negative for abdominal pain.     Vitals There were no vitals taken for this visit.  Objective:   Physical Exam unable  Assessment and Plan   1. Acute bacterial rhinosinusitis - cefdinir (OMNICEF) 300 MG capsule; Take 1 capsule (300 mg total) by mouth 2 (two) times daily.  Dispense: 20 capsule; Refill: 0   Due to history and symptoms described, will treat for sinus infection. She has multiple allergies, will use cefdinir, as she tolerated this in August for similar problem.  Agrees with plan of care discussed today. Understands warning signs to seek further care: fever, chest pain, shortness of breath. Understands to follow-up in 4-5 days if symptoms are not improving.     Pecolia Ades, FNP-C 01/25/2020

## 2020-01-25 NOTE — Telephone Encounter (Signed)
Ms. braeley, buskey are scheduled for a virtual visit with your provider today.    Just as we do with appointments in the office, we must obtain your consent to participate.  Your consent will be active for this visit and any virtual visit you may have with one of our providers in the next 365 days.    If you have a MyChart account, I can also send a copy of this consent to you electronically.  All virtual visits are billed to your insurance company just like a traditional visit in the office.  As this is a virtual visit, video technology does not allow for your provider to perform a traditional examination.  This may limit your provider's ability to fully assess your condition.  If your provider identifies any concerns that need to be evaluated in person or the need to arrange testing such as labs, EKG, etc, we will make arrangements to do so.    Although advances in technology are sophisticated, we cannot ensure that it will always work on either your end or our end.  If the connection with a video visit is poor, we may have to switch to a telephone visit.  With either a video or telephone visit, we are not always able to ensure that we have a secure connection.   I need to obtain your verbal consent now.   Are you willing to proceed with your visit today?   BLIMA JAIMES has provided verbal consent on 01/25/2020 for a virtual visit (video or telephone).   Vicente Males, LPN [id number]/07/8030  1:22 PM

## 2020-02-06 ENCOUNTER — Ambulatory Visit: Payer: Medicaid Other | Admitting: Nurse Practitioner

## 2020-02-06 ENCOUNTER — Encounter: Payer: Self-pay | Admitting: Internal Medicine

## 2020-02-06 ENCOUNTER — Emergency Department (HOSPITAL_COMMUNITY): Payer: Medicaid Other

## 2020-02-06 ENCOUNTER — Other Ambulatory Visit: Payer: Self-pay

## 2020-02-06 ENCOUNTER — Inpatient Hospital Stay (HOSPITAL_COMMUNITY)
Admission: EM | Admit: 2020-02-06 | Discharge: 2020-03-05 | DRG: 004 | Disposition: A | Discharge status: Home Health | Payer: Medicaid Other | Attending: Internal Medicine | Admitting: Internal Medicine

## 2020-02-06 DIAGNOSIS — Z978 Presence of other specified devices: Secondary | ICD-10-CM

## 2020-02-06 DIAGNOSIS — Z452 Encounter for adjustment and management of vascular access device: Secondary | ICD-10-CM

## 2020-02-06 DIAGNOSIS — Z0189 Encounter for other specified special examinations: Secondary | ICD-10-CM

## 2020-02-06 DIAGNOSIS — J9601 Acute respiratory failure with hypoxia: Principal | ICD-10-CM

## 2020-02-06 DIAGNOSIS — Z9889 Other specified postprocedural states: Secondary | ICD-10-CM

## 2020-02-06 DIAGNOSIS — A419 Sepsis, unspecified organism: Secondary | ICD-10-CM

## 2020-02-06 DIAGNOSIS — J9600 Acute respiratory failure, unspecified whether with hypoxia or hypercapnia: Secondary | ICD-10-CM

## 2020-02-06 DIAGNOSIS — J80 Acute respiratory distress syndrome: Secondary | ICD-10-CM

## 2020-02-06 DIAGNOSIS — Z4659 Encounter for fitting and adjustment of other gastrointestinal appliance and device: Secondary | ICD-10-CM

## 2020-02-06 DIAGNOSIS — U071 COVID-19: Secondary | ICD-10-CM

## 2020-02-06 DIAGNOSIS — R748 Abnormal levels of other serum enzymes: Secondary | ICD-10-CM

## 2020-02-06 DIAGNOSIS — Z930 Tracheostomy status: Secondary | ICD-10-CM

## 2020-02-06 DIAGNOSIS — E876 Hypokalemia: Secondary | ICD-10-CM

## 2020-02-06 DIAGNOSIS — G9341 Metabolic encephalopathy: Secondary | ICD-10-CM

## 2020-02-06 DIAGNOSIS — E8809 Other disorders of plasma-protein metabolism, not elsewhere classified: Secondary | ICD-10-CM

## 2020-02-06 DIAGNOSIS — B9561 Methicillin susceptible Staphylococcus aureus infection as the cause of diseases classified elsewhere: Secondary | ICD-10-CM | POA: Diagnosis not present

## 2020-02-06 DIAGNOSIS — R29818 Other symptoms and signs involving the nervous system: Secondary | ICD-10-CM | POA: Diagnosis not present

## 2020-02-06 DIAGNOSIS — K760 Fatty (change of) liver, not elsewhere classified: Secondary | ICD-10-CM | POA: Diagnosis not present

## 2020-02-06 DIAGNOSIS — Z6841 Body Mass Index (BMI) 40.0 and over, adult: Secondary | ICD-10-CM

## 2020-02-06 DIAGNOSIS — K056 Periodontal disease, unspecified: Secondary | ICD-10-CM | POA: Diagnosis not present

## 2020-02-06 DIAGNOSIS — R350 Frequency of micturition: Secondary | ICD-10-CM | POA: Diagnosis not present

## 2020-02-06 DIAGNOSIS — Z9911 Dependence on respirator [ventilator] status: Secondary | ICD-10-CM

## 2020-02-06 DIAGNOSIS — D6489 Other specified anemias: Secondary | ICD-10-CM | POA: Diagnosis not present

## 2020-02-06 DIAGNOSIS — F1910 Other psychoactive substance abuse, uncomplicated: Secondary | ICD-10-CM | POA: Diagnosis not present

## 2020-02-06 DIAGNOSIS — F411 Generalized anxiety disorder: Secondary | ICD-10-CM | POA: Diagnosis not present

## 2020-02-06 DIAGNOSIS — N390 Urinary tract infection, site not specified: Secondary | ICD-10-CM | POA: Diagnosis not present

## 2020-02-06 DIAGNOSIS — J9602 Acute respiratory failure with hypercapnia: Secondary | ICD-10-CM | POA: Diagnosis not present

## 2020-02-06 DIAGNOSIS — R4182 Altered mental status, unspecified: Secondary | ICD-10-CM | POA: Diagnosis not present

## 2020-02-06 DIAGNOSIS — R739 Hyperglycemia, unspecified: Secondary | ICD-10-CM | POA: Diagnosis not present

## 2020-02-06 DIAGNOSIS — Z781 Physical restraint status: Secondary | ICD-10-CM

## 2020-02-06 DIAGNOSIS — R1310 Dysphagia, unspecified: Secondary | ICD-10-CM | POA: Diagnosis not present

## 2020-02-06 DIAGNOSIS — J690 Pneumonitis due to inhalation of food and vomit: Secondary | ICD-10-CM | POA: Diagnosis not present

## 2020-02-06 DIAGNOSIS — G7281 Critical illness myopathy: Secondary | ICD-10-CM

## 2020-02-06 DIAGNOSIS — B961 Klebsiella pneumoniae [K. pneumoniae] as the cause of diseases classified elsewhere: Secondary | ICD-10-CM | POA: Diagnosis not present

## 2020-02-06 DIAGNOSIS — A4189 Other specified sepsis: Secondary | ICD-10-CM | POA: Diagnosis present

## 2020-02-06 DIAGNOSIS — R7989 Other specified abnormal findings of blood chemistry: Secondary | ICD-10-CM | POA: Diagnosis not present

## 2020-02-06 DIAGNOSIS — J90 Pleural effusion, not elsewhere classified: Secondary | ICD-10-CM | POA: Diagnosis not present

## 2020-02-06 DIAGNOSIS — R7309 Other abnormal glucose: Secondary | ICD-10-CM | POA: Diagnosis not present

## 2020-02-06 DIAGNOSIS — E237 Disorder of pituitary gland, unspecified: Secondary | ICD-10-CM | POA: Diagnosis not present

## 2020-02-06 DIAGNOSIS — R030 Elevated blood-pressure reading, without diagnosis of hypertension: Secondary | ICD-10-CM | POA: Diagnosis not present

## 2020-02-06 DIAGNOSIS — J1282 Pneumonia due to coronavirus disease 2019: Secondary | ICD-10-CM

## 2020-02-06 DIAGNOSIS — M6281 Muscle weakness (generalized): Secondary | ICD-10-CM | POA: Diagnosis not present

## 2020-02-06 DIAGNOSIS — G9340 Encephalopathy, unspecified: Secondary | ICD-10-CM | POA: Diagnosis not present

## 2020-02-06 DIAGNOSIS — R0603 Acute respiratory distress: Secondary | ICD-10-CM | POA: Diagnosis not present

## 2020-02-06 DIAGNOSIS — F429 Obsessive-compulsive disorder, unspecified: Secondary | ICD-10-CM | POA: Diagnosis not present

## 2020-02-06 DIAGNOSIS — R000 Tachycardia, unspecified: Secondary | ICD-10-CM | POA: Diagnosis not present

## 2020-02-06 DIAGNOSIS — R29706 NIHSS score 6: Secondary | ICD-10-CM | POA: Diagnosis not present

## 2020-02-06 DIAGNOSIS — E8770 Fluid overload, unspecified: Secondary | ICD-10-CM | POA: Diagnosis not present

## 2020-02-06 DIAGNOSIS — R7303 Prediabetes: Secondary | ICD-10-CM | POA: Diagnosis not present

## 2020-02-06 DIAGNOSIS — K219 Gastro-esophageal reflux disease without esophagitis: Secondary | ICD-10-CM | POA: Diagnosis present

## 2020-02-06 DIAGNOSIS — R7401 Elevation of levels of liver transaminase levels: Secondary | ICD-10-CM | POA: Diagnosis not present

## 2020-02-06 DIAGNOSIS — R5381 Other malaise: Secondary | ICD-10-CM | POA: Diagnosis not present

## 2020-02-06 DIAGNOSIS — G629 Polyneuropathy, unspecified: Secondary | ICD-10-CM | POA: Diagnosis not present

## 2020-02-06 DIAGNOSIS — D62 Acute posthemorrhagic anemia: Secondary | ICD-10-CM | POA: Diagnosis not present

## 2020-02-06 DIAGNOSIS — Z888 Allergy status to other drugs, medicaments and biological substances status: Secondary | ICD-10-CM

## 2020-02-06 DIAGNOSIS — F064 Anxiety disorder due to known physiological condition: Secondary | ICD-10-CM | POA: Diagnosis not present

## 2020-02-06 DIAGNOSIS — Z79899 Other long term (current) drug therapy: Secondary | ICD-10-CM

## 2020-02-06 DIAGNOSIS — G894 Chronic pain syndrome: Secondary | ICD-10-CM | POA: Diagnosis not present

## 2020-02-06 DIAGNOSIS — D649 Anemia, unspecified: Secondary | ICD-10-CM | POA: Diagnosis not present

## 2020-02-06 DIAGNOSIS — G6281 Critical illness polyneuropathy: Secondary | ICD-10-CM | POA: Diagnosis not present

## 2020-02-06 HISTORY — DX: Opioid use, unspecified, uncomplicated: F11.90

## 2020-02-06 HISTORY — DX: Obsessive-compulsive disorder, unspecified: F42.9

## 2020-02-06 HISTORY — DX: Morbid (severe) obesity due to excess calories: E66.01

## 2020-02-06 HISTORY — DX: Anxiety disorder, unspecified: F41.9

## 2020-02-06 HISTORY — DX: Generalized anxiety disorder: F41.1

## 2020-02-06 HISTORY — DX: Psoriasis, unspecified: L40.9

## 2020-02-06 HISTORY — DX: Fatty (change of) liver, not elsewhere classified: K76.0

## 2020-02-06 HISTORY — DX: Gastro-esophageal reflux disease without esophagitis: K21.9

## 2020-02-06 LAB — COMPREHENSIVE METABOLIC PANEL
ALT: 56 U/L — ABNORMAL HIGH (ref 0–44)
AST: 111 U/L — ABNORMAL HIGH (ref 15–41)
Albumin: 3.2 g/dL — ABNORMAL LOW (ref 3.5–5.0)
Alkaline Phosphatase: 108 U/L (ref 38–126)
Anion gap: 13 (ref 5–15)
BUN: 19 mg/dL (ref 6–20)
CO2: 20 mmol/L — ABNORMAL LOW (ref 22–32)
Calcium: 8 mg/dL — ABNORMAL LOW (ref 8.9–10.3)
Chloride: 107 mmol/L (ref 98–111)
Creatinine, Ser: 1.15 mg/dL — ABNORMAL HIGH (ref 0.44–1.00)
GFR, Estimated: >60 mL/min (ref >60)
Glucose, Bld: 132 mg/dL — ABNORMAL HIGH (ref 70–99)
Potassium: 2.6 mmol/L — CL (ref 3.5–5.1)
Sodium: 140 mmol/L (ref 135–145)
Total Bilirubin: 0.8 mg/dL (ref 0.3–1.2)
Total Protein: 7.2 g/dL (ref 6.5–8.1)

## 2020-02-06 LAB — URINALYSIS, ROUTINE W REFLEX MICROSCOPIC
Bilirubin Urine: NEGATIVE
Glucose, UA: NEGATIVE mg/dL
Ketones, ur: 80 mg/dL — AB
Leukocytes,Ua: NEGATIVE
Nitrite: NEGATIVE
Protein, ur: >=300 mg/dL — AB
Specific Gravity, Urine: 1.022 (ref 1.005–1.030)
pH: 5 (ref 5.0–8.0)

## 2020-02-06 LAB — APTT: aPTT: 35 seconds (ref 24–36)

## 2020-02-06 LAB — CBG MONITORING, ED: Glucose-Capillary: 145 mg/dL — ABNORMAL HIGH (ref 70–99)

## 2020-02-06 LAB — CBC WITH DIFFERENTIAL/PLATELET
Abs Immature Granulocytes: 0.08 10*3/uL — ABNORMAL HIGH (ref 0.00–0.07)
Basophils Absolute: 0 10*3/uL (ref 0.0–0.1)
Basophils Relative: 0 %
Eosinophils Absolute: 0 10*3/uL (ref 0.0–0.5)
Eosinophils Relative: 0 %
HCT: 38.5 % (ref 36.0–46.0)
Hemoglobin: 12.8 g/dL (ref 12.0–15.0)
Immature Granulocytes: 1 %
Lymphocytes Relative: 35 %
Lymphs Abs: 2.2 10*3/uL (ref 0.7–4.0)
MCH: 30.4 pg (ref 26.0–34.0)
MCHC: 33.2 g/dL (ref 30.0–36.0)
MCV: 91.4 fL (ref 80.0–100.0)
Monocytes Absolute: 0.3 10*3/uL (ref 0.1–1.0)
Monocytes Relative: 5 %
Neutro Abs: 3.8 10*3/uL (ref 1.7–7.7)
Neutrophils Relative %: 59 %
Platelets: 187 10*3/uL (ref 150–400)
RBC: 4.21 MIL/uL (ref 3.87–5.11)
RDW: 13.3 % (ref 11.5–15.5)
WBC Morphology: ABNORMAL
WBC: 6.3 10*3/uL (ref 4.0–10.5)
nRBC: 0.3 % — ABNORMAL HIGH (ref 0.0–0.2)

## 2020-02-06 LAB — RESP PANEL BY RT-PCR (FLU A&B, COVID) ARPGX2
Influenza A by PCR: NEGATIVE
Influenza B by PCR: NEGATIVE
SARS Coronavirus 2 by RT PCR: POSITIVE — AB

## 2020-02-06 LAB — PROTIME-INR
INR: 1.1 (ref 0.8–1.2)
Prothrombin Time: 13.4 seconds (ref 11.4–15.2)

## 2020-02-06 LAB — LACTIC ACID, PLASMA
Lactic Acid, Venous: 1.5 mmol/L (ref 0.5–1.9)
Lactic Acid, Venous: 1.2 mmol/L (ref 0.5–1.9)

## 2020-02-06 MED ORDER — ONDANSETRON HCL 4 MG/2ML IJ SOLN
4.0000 mg | Freq: Four times a day (QID) | INTRAMUSCULAR | Status: DC | PRN
Start: 2020-02-06 — End: 2020-02-07
  Administered 2020-02-07: 4 mg via INTRAVENOUS
  Filled 2020-02-06: qty 2

## 2020-02-06 MED ORDER — LORAZEPAM 2 MG/ML IJ SOLN
INTRAMUSCULAR | Status: AC
  Administered 2020-02-06: 1 mg via INTRAMUSCULAR
  Filled 2020-02-06: qty 1

## 2020-02-06 MED ORDER — SODIUM CHLORIDE 0.9 % IV SOLN
2.0000 g | Freq: Once | INTRAVENOUS | Status: AC
  Administered 2020-02-06: 2 g via INTRAVENOUS
  Filled 2020-02-06 (×2): qty 2

## 2020-02-06 MED ORDER — SODIUM CHLORIDE 0.9 % IV SOLN
100.0000 mg | Freq: Every day | INTRAVENOUS | Status: AC
  Administered 2020-02-07 – 2020-02-10 (×4): 100 mg via INTRAVENOUS
  Filled 2020-02-06 (×2): qty 20
  Filled 2020-02-06: qty 100
  Filled 2020-02-06: qty 20

## 2020-02-06 MED ORDER — METRONIDAZOLE IN NACL 5-0.79 MG/ML-% IV SOLN
500.0000 mg | Freq: Once | INTRAVENOUS | Status: AC
  Administered 2020-02-06: 500 mg via INTRAVENOUS
  Filled 2020-02-06: qty 100

## 2020-02-06 MED ORDER — DEXAMETHASONE SODIUM PHOSPHATE 10 MG/ML IJ SOLN
10.0000 mg | Freq: Once | INTRAMUSCULAR | Status: AC
  Administered 2020-02-06: 10 mg via INTRAVENOUS
  Filled 2020-02-06: qty 1

## 2020-02-06 MED ORDER — INSULIN ASPART 100 UNIT/ML ~~LOC~~ SOLN
0.0000 [IU] | SUBCUTANEOUS | Status: DC
  Administered 2020-02-07: 3 [IU] via SUBCUTANEOUS
  Administered 2020-02-07 – 2020-02-08 (×2): 5 [IU] via SUBCUTANEOUS
  Administered 2020-02-08 (×2): 2 [IU] via SUBCUTANEOUS

## 2020-02-06 MED ORDER — ONDANSETRON HCL 4 MG PO TABS: 4.0000 mg | ORAL_TABLET | Freq: Four times a day (QID) | ORAL | Status: DC | PRN

## 2020-02-06 MED ORDER — POTASSIUM CHLORIDE 10 MEQ/100ML IV SOLN
10.0000 meq | INTRAVENOUS | Status: AC
  Administered 2020-02-06 – 2020-02-07 (×4): 10 meq via INTRAVENOUS
  Filled 2020-02-06 (×4): qty 100

## 2020-02-06 MED ORDER — LACTATED RINGERS IV BOLUS (SEPSIS)
1000.0000 mL | Freq: Once | INTRAVENOUS | Status: AC
  Administered 2020-02-06: 1000 mL via INTRAVENOUS

## 2020-02-06 MED ORDER — SODIUM CHLORIDE 0.9 % IV SOLN
100.0000 mg | INTRAVENOUS | Status: AC
  Administered 2020-02-06: 100 mg via INTRAVENOUS
  Filled 2020-02-06: qty 20

## 2020-02-06 MED ORDER — ACETAMINOPHEN 650 MG RE SUPP
650.0000 mg | Freq: Once | RECTAL | Status: AC
  Administered 2020-02-06: 650 mg via RECTAL
  Filled 2020-02-06 (×2): qty 1

## 2020-02-06 MED ORDER — HYDROCOD POLST-CPM POLST ER 10-8 MG/5ML PO SUER
5.0000 mL | Freq: Two times a day (BID) | ORAL | Status: DC | PRN
  Administered 2020-02-07: 5 mL via ORAL
  Filled 2020-02-06: qty 5

## 2020-02-06 MED ORDER — METHYLPREDNISOLONE SODIUM SUCC 125 MG IJ SOLR
0.5000 mg/kg | Freq: Two times a day (BID) | INTRAMUSCULAR | Status: DC
  Administered 2020-02-07 – 2020-02-08 (×3): 56.875 mg via INTRAVENOUS
  Filled 2020-02-06 (×3): qty 2

## 2020-02-06 MED ORDER — GUAIFENESIN-DM 100-10 MG/5ML PO SYRP: 10.0000 mL | ORAL_SOLUTION | ORAL | Status: DC | PRN

## 2020-02-06 MED ORDER — LACTATED RINGERS IV SOLN: INTRAVENOUS | Status: DC

## 2020-02-06 MED ORDER — PREDNISONE 20 MG PO TABS: 50.0000 mg | ORAL_TABLET | Freq: Every day | ORAL | Status: DC

## 2020-02-06 MED ORDER — ALBUTEROL SULFATE HFA 108 (90 BASE) MCG/ACT IN AERS: 2.0000 | INHALATION_SPRAY | Freq: Four times a day (QID) | RESPIRATORY_TRACT | Status: DC

## 2020-02-06 MED ORDER — ENOXAPARIN SODIUM 40 MG/0.4ML ~~LOC~~ SOLN
40.0000 mg | SUBCUTANEOUS | Status: DC
  Administered 2020-02-07: 40 mg via SUBCUTANEOUS
  Filled 2020-02-06: qty 0.4

## 2020-02-06 MED ORDER — ACETAMINOPHEN 325 MG PO TABS: 650.0000 mg | ORAL_TABLET | Freq: Four times a day (QID) | ORAL | Status: DC | PRN

## 2020-02-06 MED ORDER — VANCOMYCIN HCL IN DEXTROSE 1-5 GM/200ML-% IV SOLN: 1000.0000 mg | Freq: Once | INTRAVENOUS | Status: DC

## 2020-02-06 MED ORDER — LORAZEPAM 2 MG/ML IJ SOLN: 1.0000 mg | Freq: Once | INTRAMUSCULAR | Status: AC

## 2020-02-06 MED ORDER — VANCOMYCIN HCL IN DEXTROSE 750-5 MG/150ML-% IV SOLN
750.0000 mg | Freq: Two times a day (BID) | INTRAVENOUS | Status: DC
  Administered 2020-02-07: 750 mg via INTRAVENOUS
  Filled 2020-02-06 (×3): qty 150

## 2020-02-06 MED ORDER — SODIUM CHLORIDE 0.9 % IV SOLN
2.0000 g | Freq: Three times a day (TID) | INTRAVENOUS | Status: DC
  Administered 2020-02-07: 2 g via INTRAVENOUS
  Filled 2020-02-06: qty 2

## 2020-02-06 MED ORDER — VANCOMYCIN HCL 2000 MG/400ML IV SOLN
2000.0000 mg | Freq: Once | INTRAVENOUS | Status: AC
  Administered 2020-02-06: 2000 mg via INTRAVENOUS
  Filled 2020-02-06: qty 400

## 2020-02-06 NOTE — ED Provider Notes (Signed)
Emergency Department Provider Note   I have reviewed the triage vital signs and the nursing notes.   HISTORY  Chief Complaint Altered Mental Status   HPI Belinda [name] is a 39 y.o. female with unknown past medical history presents to the emergency department with fever, altered mental status. She apparently lives at home with her son who ultimately called EMS. She has been altered over the past 24 to 48 hours. According to EMS the son thought that she was resting. She did have recent dental extraction and per EMS smells like urine. They noted some dried vomit near the patient as well. They found her initially hypotensive and administered 200 mL of IV fluids. The patient is reportedly slightly combative with them in route.   Level 5 caveat: AMS    No past medical history on file.  There are no problems to display for this patient.   Allergies Patient has no allergy information on record.  No family history on file.  Social History    Review of Systems  Level 5 caveat: AMS   ____________________________________________   PHYSICAL EXAM:  VITAL SIGNS: Vitals:   02/06/20 2238 02/06/20 2239  BP:    Pulse: (!) 106 (!) 104  Resp: (!) 27 (!) 22  Temp: 99.9 F (37.7 C) 99.9 F (37.7 C)  SpO2: 99% 97%   Constitutional: Alert but confused. Making eye contact but moaning in response to questions and pulling an IV sites and O2 mask.  Eyes: Conjunctivae are normal. PERRL. Head: Atraumatic. Nose: No congestion/rhinnorhea. Mouth/Throat: Mucous membranes are very dry.  Neck: No stridor.   Cardiovascular: Tachycardia. Good peripheral circulation. Grossly normal heart sounds.   Respiratory: Increased respiratory effort.  No retractions. Lungs rhonchi at the bases.  Gastrointestinal: Soft and nontender. No distention.  Musculoskeletal: No lower extremity tenderness nor edema. No gross deformities of extremities. Neurologic: Moaning but unable to assess speech. Patient is  very confused which limits exam. Moving upper and lower extremities equally.  Skin:  Skin is warm, dry and intact. No rash noted.  ____________________________________________   LABS (all labs ordered are listed, but only abnormal results are displayed)  Labs Reviewed  RESP PANEL BY RT-PCR (FLU A&B, COVID) ARPGX2 - Abnormal; Notable for the following components:      Result Value   SARS Coronavirus 2 by RT PCR POSITIVE (*)    All other components within normal limits  COMPREHENSIVE METABOLIC PANEL - Abnormal; Notable for the following components:   Potassium 2.6 (*)    CO2 20 (*)    Glucose, Bld 132 (*)    Creatinine, Ser 1.15 (*)    Calcium 8.0 (*)    Albumin 3.2 (*)    AST 111 (*)    ALT 56 (*)    All other components within normal limits  CBC WITH DIFFERENTIAL/PLATELET - Abnormal; Notable for the following components:   nRBC 0.3 (*)    Abs Immature Granulocytes 0.08 (*)    All other components within normal limits  URINALYSIS, ROUTINE W REFLEX MICROSCOPIC - Abnormal; Notable for the following components:   Color, Urine AMBER (*)    APPearance CLOUDY (*)    Hgb urine dipstick MODERATE (*)    Ketones, ur 80 (*)    Protein, ur >=300 (*)    Bacteria, UA MANY (*)    All other components within normal limits  CBG MONITORING, ED - Abnormal; Notable for the following components:   Glucose-Capillary 145 (*)    All  other components within normal limits  CULTURE, BLOOD (ROUTINE X 2)  CULTURE, BLOOD (ROUTINE X 2)  URINE CULTURE  LACTIC ACID, PLASMA  LACTIC ACID, PLASMA  PROTIME-INR  APTT   ____________________________________________  EKG   EKG Interpretation  Date/Time:  Wednesday February 06 2020 19:21:13 EST Ventricular Rate:  96 PR Interval:    QRS Duration: 99 QT Interval:  358 QTC Calculation: 453 R Axis:   50 Text Interpretation: Sinus rhythm No STEMI Confirmed by Nanda Quinton [phone] on 02/06/2020 10:58:19 PM        ____________________________________________  RADIOLOGY  CT Head Wo Contrast  Result Date: 02/06/2020 CLINICAL DATA:  Altered mental status.  Recent tooth extraction. EXAM: CT HEAD WITHOUT CONTRAST TECHNIQUE: Contiguous axial images were obtained from the base of the skull through the vertex without intravenous contrast. COMPARISON:  Head CT 09/01/2017 FINDINGS: Brain: No intracranial hemorrhage, mass effect, or midline shift. No hydrocephalus. The basilar cisterns are patent. No evidence of territorial infarct or acute ischemia. No extra-axial or intracranial fluid collection. Vascular: No hyperdense vessel or unexpected calcification. Skull: No fracture or focal lesion. Sinuses/Orbits: Mucosal thickening of the right greater than left maxillary sinus. Scattered mucosal thickening throughout ethmoid air cells. Patient appears edentulous of upper teeth, presumed prior periapical lucency in the right upper molars, contiguous with the right maxillary sinus. No acute orbital abnormality. Mastoid air cells are clear. Other: None. IMPRESSION: 1. No acute intracranial abnormality. 2. Paranasal sinus disease. 3. Patient appears edentulous of upper teeth, presumed prior periapical lucency in the right upper molars, contiguous with the right maxillary sinus. CLINICAL DATA:  Altered mental status. Recent tooth extraction. EXAM: CT HEAD WITHOUT CONTRAST TECHNIQUE: Contiguous axial images were obtained from the base of the skull through the vertex without intravenous contrast. COMPARISON:  Head CT 09/01/2017 FINDINGS: Brain: No intracranial hemorrhage, mass effect, or midline shift. No hydrocephalus. The basilar cisterns are patent. No evidence of territorial infarct or acute ischemia. No extra-axial or intracranial fluid collection. Vascular: No hyperdense vessel or unexpected calcification. Skull: No fracture or focal lesion. Sinuses/Orbits: Mucosal thickening of the right greater than left maxillary sinus.  Scattered mucosal thickening throughout ethmoid air cells. Patient appears edentulous of upper teeth, presumed prior periapical lucency in the right upper molars, contiguous with the right maxillary sinus. No acute orbital abnormality. Mastoid air cells are clear. Other: None. IMPRESSION: 1. No acute intracranial abnormality. 2. Paranasal sinus mucosal thickening. 3. Patient appears edentulous of upper teeth, presumed prior periodontal disease in the right upper molars, contiguous with the right maxillary sinus. Electronically Signed   By: Keith Rake M.D.   On: 02/06/2020 22:18   DG Chest Port 1 View  Result Date: 02/06/2020 CLINICAL DATA:  Sepsis.  Encephalopathy. EXAM: PORTABLE CHEST 1 VIEW COMPARISON:  None. FINDINGS: Multifocal airspace opacity throughout both lungs. Small pleural effusions. No pneumothorax. IMPRESSION: Multifocal bilateral airspace opacities concerning for multifocal infection. Electronically Signed   By: Ulyses Jarred M.D.   On: 02/06/2020 19:36    ____________________________________________   PROCEDURES  Procedure(s) performed:   .Critical Care Performed by: Margette Fast, MD Authorized by: Margette Fast, MD   Critical care provider statement:    Critical care time (minutes):  75   Critical care time was exclusive of:  Separately billable procedures and treating other patients and teaching time   Critical care was necessary to treat or prevent imminent or life-threatening deterioration of the following conditions:  Respiratory failure, CNS failure or compromise and sepsis  Critical care was time spent personally by me on the following activities:  Discussions with consultants, evaluation of patient's response to treatment, examination of patient, ordering and performing treatments and interventions, ordering and review of laboratory studies, ordering and review of radiographic studies, pulse oximetry, re-evaluation of patient's condition, obtaining history  from patient or surrogate, review of old charts, blood draw for specimens and development of treatment plan with patient or surrogate   I assumed direction of critical care for this patient from another provider in my specialty: no       ____________________________________________   INITIAL IMPRESSION / ASSESSMENT AND PLAN / ED COURSE  Pertinent labs & imaging results that were available during my care of the patient were reviewed by me and considered in my medical decision making (see chart for details).   Patient arrives to the emergency department by EMS. Hypotension is improving with IV fluids. Patient is making eye contact with me and following commands but mainly grunting in response to questions. History is significantly limited. I evaluated the patient at the bridge while awaiting bed placement. Will started sepsis orders, CT head, CXR, and COVID swab.   07:51 PM  The patient's father is now in the emergency department I spoke with him in the family room.  He states that actually the patient's dental extraction was 2 months ago and has been healing.  He states that her son, who she lives with in an apartment, was diagnosed with Covid last week.  They had not seen her because the family had been sick and so when the son called them today saying that she was not responding well and not leaving her room they told him to call EMS.   10:15 PM  Patient's CT head is negative for acute process. COVID positive.  Patient has not been hypotensive.  She has required some soft restraints to keep from pulling off her O2 which causes her to desaturate when she does.  Her mental status is improving slightly.  She remains very confused but is saying words and short phrases. Starting Decadron and remdesivir with Covid positivity. Possible UTI on UA but suspect COVID contributing to hypoxemia and encephalopathy. Hypokalemia noted and replaced IV.   Discussed patient's case with [facility] to request admission.  Patient and family (if present) updated with plan. Care transferred to [facility] service.  I reviewed all nursing notes, vitals, pertinent old records, EKGs, labs, imaging (as available).  ____________________________________________  FINAL CLINICAL IMPRESSION(S) / ED DIAGNOSES  Final diagnoses:  Acute respiratory failure with hypoxia (HCC)  Metabolic encephalopathy  [id number]     MEDICATIONS GIVEN DURING THIS VISIT:  Medications  lactated ringers infusion (0 mLs Intravenous Stopped 02/06/20 2142)  vancomycin (VANCOCIN) IVPB 750 mg/150 ml premix (has no administration in time range)  ceFEPIme (MAXIPIME) 2 g in sodium chloride 0.9 % 100 mL IVPB (has no administration in time range)  potassium chloride 10 mEq in 100 mL IVPB (10 mEq Intravenous New Bag/Given 02/06/20 2246)  remdesivir 100 mg in sodium chloride 0.9 % 100 mL IVPB (has no administration in time range)  remdesivir 100 mg in sodium chloride 0.9 % 100 mL IVPB (100 mg Intravenous New Bag/Given 02/06/20 2235)  lactated ringers bolus 1,000 mL (0 mLs Intravenous Stopped 02/06/20 2042)    And  lactated ringers bolus 1,000 mL (0 mLs Intravenous Stopped 02/06/20 2142)  ceFEPIme (MAXIPIME) 2 g in sodium chloride 0.9 % 100 mL IVPB (0 g Intravenous Stopped 02/06/20 2056)  metroNIDAZOLE (FLAGYL) IVPB  500 mg (0 mg Intravenous Stopped 02/06/20 2204)  acetaminophen (TYLENOL) suppository 650 mg (650 mg Rectal Given 02/06/20 2000)  LORazepam (ATIVAN) injection 1 mg (1 mg Intramuscular Given 02/06/20 1901)  vancomycin (VANCOREADY) IVPB 2000 mg/400 mL (2,000 mg Intravenous New Bag/Given 02/06/20 1950)  dexamethasone (DECADRON) injection 10 mg (10 mg Intravenous Given 02/06/20 2230)    Note:  This document was prepared using Dragon voice recognition software and may include unintentional dictation errors.  Nanda Quinton, MD, [facility] Emergency Medicine    Alexandr Oehler, [facility], MD 02/06/20 2258

## 2020-02-06 NOTE — ED Triage Notes (Signed)
Pt. Had several teeth removed last week. Pts. Family stated pt. Has not been only moaning and sleeping for the last 36-48 hours. Pt. Was combative en-route. Pts. Blood pressure was 80/50 when ems arrived at pts. Home. Pt. Has a rash to the right side of the neck. Pt. Also has white film on their tongue. Pt. Was given 1g of rocephin and 450 cc of fluids in route.

## 2020-02-06 NOTE — ED Notes (Signed)
Vital signs stable. 

## 2020-02-06 NOTE — ED Notes (Signed)
Date and time results received: 02/06/20 1953 (use smartphrase ".now" to insert current time)  Test: potassium Critical Value: 2.6  Name of Provider Notified: Dr long  Orders Received? Or Actions Taken?: Actions Taken: no orders received

## 2020-02-06 NOTE — H&P (Signed)
History and Physical  Belinda [name] RSW:[id number] DOB: 10/30/1980 DOA: 02/06/2020  Referring physician: Margette Fast, MD PCP: Kathyrn Drown, MD  Patient coming from: Home  Chief Complaint: Altered mental status  HPI: Belinda [name] is a 39 y.o. female with significant medical history of obesity who presents to the emergency department via EMS due to altered mental status and fever.  Patient was unable to provide history due to altered mental status, history was obtained from ED physician and ED medical record.  Per report, patient has had altered mental status for 24 to 48 hours PTA, she lives with son who had Covid, patient was reported to have been in her room for more than a day, son initially thought that she was just resting, on checking on her, he realized that her mental status was altered and EMS was activated.  On arrival of EMS, dried vomit was noted near her and she was hypotensive, IV NS of 200 mL was given in route and patient was reported to be slightly combative in route to the hospital.  ED Course:  In the emergency department, she was febrile with a temperature of 103.29F, tachypneic and intermittently tachycardic.  She was hypoxic with an O2 sat of 80% on room air and required HFNC 15 L to obtain O2 sat in the 90s.  Work-up in the ED showed normal CBC, hypokalemia, hypoalbuminemia and elevated liver enzymes.  Urinalysis was positive for moderate hemoglobin, ketones, protein (>/= 300) and many bacteria.  SARS coronavirus 2 was positive. Chest x-ray showed multifocal bilateral airspace opacities concerning for multifocal infection CT of head shows no acute intracranial abnormality Patient was started on IV cefepime, Flagyl and vancomycin.  IV Decadron and remdesivir was started Hospitalist was asked to admit patient for further evaluation and management.  Review of Systems: This cannot be obtained at this time due to patient's altered mental status  No past medical  history on file.  Social History:  has no history on file for tobacco use, alcohol use, and drug use.   No Known Allergies  No family history on file.   Prior to Admission medications   Not on File    Physical Exam: BP 111/62   Pulse (!) 104   Temp 99.9 F (37.7 C)   Resp (!) 22   Ht 5' 3"  (1.6 m)   Wt 113.4 kg   SpO2 97%   BMI 44.29 kg/m   . General: 39 y.o. year-old female well developed well nourished in no acute distress.  Alert and oriented x 1 (self). Marland Kitchen HEENT: Dry mucous membrane.  NCAT, EOMI . Neck: Supple, trachea medial . Cardiovascular: Regular rate and rhythm with no rubs or gallops.  No thyromegaly or JVD noted. 2/4 pulses in all 4 extremities. Marland Kitchen Respiratory: Tachypnea.  Bilateral rhonchi in lower lobes.  . Abdomen: Soft nontender nondistended with normal bowel sounds x4 quadrants. . Muskuloskeletal: No cyanosis, clubbing or edema noted bilaterally . Neuro: CN II-XII intact, strength, sensation, reflexes . Skin: No ulcerative lesions noted or rashes . Psychiatry:  Mood is appropriate for condition and setting          Labs on Admission:  Basic Metabolic Panel: Recent Labs  Lab 02/06/20 1834  NA 140  K 2.6*  CL 107  CO2 20*  GLUCOSE 132*  BUN 19  CREATININE 1.15*  CALCIUM 8.0*   Liver Function Tests: Recent Labs  Lab 02/06/20 1834  AST 111*  ALT 56*  ALKPHOS 108  BILITOT 0.8  PROT 7.2  ALBUMIN 3.2*   No results for input(s): LIPASE, AMYLASE in the last 168 hours. No results for input(s): AMMONIA in the last 168 hours. CBC: Recent Labs  Lab 02/06/20 1834  WBC 6.3  NEUTROABS 3.8  HGB 12.8  HCT 38.5  MCV 91.4  PLT 187   Cardiac Enzymes: No results for input(s): CKTOTAL, CKMB, CKMBINDEX, TROPONINI in the last 168 hours.  BNP (last 3 results) No results for input(s): BNP in the last 8760 hours.  ProBNP (last 3 results) No results for input(s): PROBNP in the last 8760 hours.  CBG: Recent Labs  Lab 02/06/20 1931  GLUCAP 145*     Radiological Exams on Admission: CT Head Wo Contrast  Result Date: 02/06/2020 CLINICAL DATA:  Altered mental status.  Recent tooth extraction. EXAM: CT HEAD WITHOUT CONTRAST TECHNIQUE: Contiguous axial images were obtained from the base of the skull through the vertex without intravenous contrast. COMPARISON:  Head CT 09/01/2017 FINDINGS: Brain: No intracranial hemorrhage, mass effect, or midline shift. No hydrocephalus. The basilar cisterns are patent. No evidence of territorial infarct or acute ischemia. No extra-axial or intracranial fluid collection. Vascular: No hyperdense vessel or unexpected calcification. Skull: No fracture or focal lesion. Sinuses/Orbits: Mucosal thickening of the right greater than left maxillary sinus. Scattered mucosal thickening throughout ethmoid air cells. Patient appears edentulous of upper teeth, presumed prior periapical lucency in the right upper molars, contiguous with the right maxillary sinus. No acute orbital abnormality. Mastoid air cells are clear. Other: None. IMPRESSION: 1. No acute intracranial abnormality. 2. Paranasal sinus disease. 3. Patient appears edentulous of upper teeth, presumed prior periapical lucency in the right upper molars, contiguous with the right maxillary sinus. CLINICAL DATA:  Altered mental status. Recent tooth extraction. EXAM: CT HEAD WITHOUT CONTRAST TECHNIQUE: Contiguous axial images were obtained from the base of the skull through the vertex without intravenous contrast. COMPARISON:  Head CT 09/01/2017 FINDINGS: Brain: No intracranial hemorrhage, mass effect, or midline shift. No hydrocephalus. The basilar cisterns are patent. No evidence of territorial infarct or acute ischemia. No extra-axial or intracranial fluid collection. Vascular: No hyperdense vessel or unexpected calcification. Skull: No fracture or focal lesion. Sinuses/Orbits: Mucosal thickening of the right greater than left maxillary sinus. Scattered mucosal thickening  throughout ethmoid air cells. Patient appears edentulous of upper teeth, presumed prior periapical lucency in the right upper molars, contiguous with the right maxillary sinus. No acute orbital abnormality. Mastoid air cells are clear. Other: None. IMPRESSION: 1. No acute intracranial abnormality. 2. Paranasal sinus mucosal thickening. 3. Patient appears edentulous of upper teeth, presumed prior periodontal disease in the right upper molars, contiguous with the right maxillary sinus. Electronically Signed   By: Keith Rake M.D.   On: 02/06/2020 22:18   DG Chest Port 1 View  Result Date: 02/06/2020 CLINICAL DATA:  Sepsis.  Encephalopathy. EXAM: PORTABLE CHEST 1 VIEW COMPARISON:  None. FINDINGS: Multifocal airspace opacity throughout both lungs. Small pleural effusions. No pneumothorax. IMPRESSION: Multifocal bilateral airspace opacities concerning for multifocal infection. Electronically Signed   By: Ulyses Jarred M.D.   On: 02/06/2020 19:36    EKG: I independently viewed the EKG done and my findings are as followed: Normal sinus rhythm at a rate of 96 bpm  Assessment/Plan Present on Admission: . Pneumonia due to COVID-19 virus  Active Problems:   Pneumonia due to COVID-19 virus   Sepsis ([place])   Acute metabolic encephalopathy   Hypokalemia   Hypoalbuminemia   Elevated liver enzymes  Sepsis secondary to acute metabolic neuropathy due to COVID-19 virus pneumonia R/O superimposed multifocal pneumonia POA SARS coronavirus 2 virus was positive Patient was febrile, tachypneic (meets SIRS criteria) Source of infection was - Lung with chest x-ray showing multifocal bilateral airspace opacities concerning for multifocal infection Continue albuterol q.6h Continue IV Solu-Medrol per pharmacy dosing Continue IV Remdesivir per pharmacy protocol Continue baricitinib Continue Mucinex, Robitussin and Tussionex Continue Tylenol p.r.n. for fever Patient was started on IV cefepime, Flagyl and  vancomycin due to presumed bacterial pneumonia; we shall continue with IV cefepime and vancomycin at this time with plan to de-escalate/discontinue based on procalcitonin, blood culture, urine Legionella, strep pneumo, sputum culture Continue supplemental oxygen (currently requiring HFNC at 15 L) to maintain O2 sat > or = 94% with plan to wean patient off supplemental oxygen as tolerated (of note, patient does not use oxygen at baseline) Continue incentive spirometry and flutter valve q43mn as tolerated Encourage proning, early ambulation, and side laying as tolerated Continue airborne isolation precaution Continue monitoring daily inflammatory markers Physician PPE:  Surgical mask with face shield, N-95, nonsterile gloves, disposable gown, head and shoe cover s Patient PPE:  Face mask   NOTE: Several hours after admission, patient continues to hypoxic with an O2 sat of 80% on HFNC.  NRB was added at 15 L with O2 sat of 92-94% at this time.   Hypokalemia K+ 2.6, this will be replenished  Elevated liver enzymes AST 111, ALT 56 This is possibly secondary to shock liver  Hypoalbuminemia possible secondary to mild protein calorie malnutrition Protein supplement will be provided  DVT prophylaxis: Lovenox  Code Status: Full code  Family Communication: None at bedside  Disposition Plan:  Patient is from:                        home Anticipated DC to:                   SNF or family members home Anticipated DC date:               2-3 days Anticipated DC barriers:           Patient is unstable to be discharged at this time due to sepsis secondary to COVID-19 virus pneumonia with possible superimposed bacterial pneumonia requiring inpatient management  Consults called: None  Admission status: Inpatient  OBernadette HoitMD [facility]  02/07/2020, 1:07 AM

## 2020-02-06 NOTE — Progress Notes (Signed)
Pharmacy Antibiotic Note  Belinda [name] is a 39 y.o. female admitted on 02/06/2020 with fever and AMS; recently had teeth extractions.  Pharmacy has been consulted for vancomycin and cefepime dosing; metronidazole ordered also. She is febrile at 103.2, WBC are normal, SCr elevated at 1.15 (baseline ~0.9s), and LA is normal. CXR concerning for multifocal infection.   Plan: Vancomycin 2000 mg IV load then 750 mg IV q12h Cefepime 2 g IV q8h Monitor renal function, clinical progress, cultures/sensitivities F/U LOT and de-escalate as able Vancomycin levels as clinically indicated   Height: 5' 3"  (160 cm) Weight: 113.4 kg (250 lb) IBW/kg (Calculated) : 52.4  Temp (24hrs), Avg:103.2 F (39.6 C), Min:103.2 F (39.6 C), Max:103.2 F (39.6 C)  Recent Labs  Lab 02/06/20 1834  WBC 6.3    CrCl cannot be calculated (No successful lab value found.).    Not on File  Antimicrobials this admission: Ceftriaxone x1 10/15 Vancomycin 12/15 >>  Cefepime 12/15 >> Metronidazole 12/15 >>  Dose adjustments this admission:   Microbiology results: 12/15 BCx: pending  12/15 UCx: pending   Thank you for involving pharmacy in this patient's care.  Renold Genta, PharmD, BCPS Clinical Pharmacist Clinical phone for 02/06/2020 until 10p is [phone] 02/06/2020 7:40 PM  **Pharmacist phone directory can be found on [email or web address] listed under [place]**

## 2020-02-07 ENCOUNTER — Inpatient Hospital Stay (HOSPITAL_COMMUNITY): Payer: Medicaid Other

## 2020-02-07 DIAGNOSIS — E876 Hypokalemia: Secondary | ICD-10-CM

## 2020-02-07 DIAGNOSIS — R748 Abnormal levels of other serum enzymes: Secondary | ICD-10-CM

## 2020-02-07 DIAGNOSIS — A419 Sepsis, unspecified organism: Secondary | ICD-10-CM

## 2020-02-07 DIAGNOSIS — E8809 Other disorders of plasma-protein metabolism, not elsewhere classified: Secondary | ICD-10-CM

## 2020-02-07 DIAGNOSIS — G9341 Metabolic encephalopathy: Secondary | ICD-10-CM

## 2020-02-07 DIAGNOSIS — U071 COVID-19: Secondary | ICD-10-CM

## 2020-02-07 DIAGNOSIS — R739 Hyperglycemia, unspecified: Secondary | ICD-10-CM | POA: Diagnosis not present

## 2020-02-07 DIAGNOSIS — D6489 Other specified anemias: Secondary | ICD-10-CM | POA: Diagnosis not present

## 2020-02-07 DIAGNOSIS — K056 Periodontal disease, unspecified: Secondary | ICD-10-CM | POA: Diagnosis not present

## 2020-02-07 DIAGNOSIS — E237 Disorder of pituitary gland, unspecified: Secondary | ICD-10-CM | POA: Diagnosis not present

## 2020-02-07 DIAGNOSIS — A4189 Other specified sepsis: Secondary | ICD-10-CM | POA: Diagnosis not present

## 2020-02-07 DIAGNOSIS — N390 Urinary tract infection, site not specified: Secondary | ICD-10-CM | POA: Diagnosis not present

## 2020-02-07 DIAGNOSIS — K219 Gastro-esophageal reflux disease without esophagitis: Secondary | ICD-10-CM | POA: Diagnosis not present

## 2020-02-07 DIAGNOSIS — F429 Obsessive-compulsive disorder, unspecified: Secondary | ICD-10-CM | POA: Diagnosis not present

## 2020-02-07 DIAGNOSIS — J9690 Respiratory failure, unspecified, unspecified whether with hypoxia or hypercapnia: Secondary | ICD-10-CM | POA: Diagnosis not present

## 2020-02-07 DIAGNOSIS — J80 Acute respiratory distress syndrome: Secondary | ICD-10-CM | POA: Diagnosis not present

## 2020-02-07 DIAGNOSIS — F411 Generalized anxiety disorder: Secondary | ICD-10-CM | POA: Diagnosis not present

## 2020-02-07 DIAGNOSIS — J189 Pneumonia, unspecified organism: Secondary | ICD-10-CM | POA: Diagnosis not present

## 2020-02-07 DIAGNOSIS — Z6841 Body Mass Index (BMI) 40.0 and over, adult: Secondary | ICD-10-CM | POA: Diagnosis not present

## 2020-02-07 DIAGNOSIS — K760 Fatty (change of) liver, not elsewhere classified: Secondary | ICD-10-CM | POA: Diagnosis not present

## 2020-02-07 DIAGNOSIS — G629 Polyneuropathy, unspecified: Secondary | ICD-10-CM | POA: Diagnosis not present

## 2020-02-07 DIAGNOSIS — J1282 Pneumonia due to coronavirus disease 2019: Secondary | ICD-10-CM | POA: Diagnosis not present

## 2020-02-07 DIAGNOSIS — J9601 Acute respiratory failure with hypoxia: Secondary | ICD-10-CM | POA: Diagnosis not present

## 2020-02-07 DIAGNOSIS — Z781 Physical restraint status: Secondary | ICD-10-CM | POA: Diagnosis not present

## 2020-02-07 DIAGNOSIS — B961 Klebsiella pneumoniae [K. pneumoniae] as the cause of diseases classified elsewhere: Secondary | ICD-10-CM | POA: Diagnosis not present

## 2020-02-07 DIAGNOSIS — J690 Pneumonitis due to inhalation of food and vomit: Secondary | ICD-10-CM | POA: Diagnosis not present

## 2020-02-07 DIAGNOSIS — Z9911 Dependence on respirator [ventilator] status: Secondary | ICD-10-CM | POA: Diagnosis not present

## 2020-02-07 LAB — BLOOD GAS, ARTERIAL
Acid-base deficit: 1.8 mmol/L (ref 0.0–2.0)
Bicarbonate: 22.9 mmol/L (ref 20.0–28.0)
FIO2: 100
O2 Saturation: 87.6 %
Patient temperature: 39.4
pCO2 arterial: 40.3 mmHg (ref 32.0–48.0)
pH, Arterial: 7.372 (ref 7.350–7.450)
pO2, Arterial: 66.5 mmHg — ABNORMAL LOW (ref 83.0–108.0)

## 2020-02-07 LAB — CBG MONITORING, ED
Glucose-Capillary: 170 mg/dL — ABNORMAL HIGH (ref 70–99)
Glucose-Capillary: 198 mg/dL — ABNORMAL HIGH (ref 70–99)

## 2020-02-07 LAB — BASIC METABOLIC PANEL
Anion gap: 11 (ref 5–15)
BUN: 11 mg/dL (ref 6–20)
CO2: 21 mmol/L — ABNORMAL LOW (ref 22–32)
Calcium: 7.7 mg/dL — ABNORMAL LOW (ref 8.9–10.3)
Chloride: 107 mmol/L (ref 98–111)
Creatinine, Ser: 0.8 mg/dL (ref 0.44–1.00)
GFR, Estimated: >60 mL/min (ref >60)
Glucose, Bld: 256 mg/dL — ABNORMAL HIGH (ref 70–99)
Potassium: 3.6 mmol/L (ref 3.5–5.1)
Sodium: 139 mmol/L (ref 135–145)

## 2020-02-07 LAB — CBC WITH DIFFERENTIAL/PLATELET
Abs Immature Granulocytes: 0.07 10*3/uL (ref 0.00–0.07)
Basophils Absolute: 0 10*3/uL (ref 0.0–0.1)
Basophils Relative: 0 %
Eosinophils Absolute: 0 10*3/uL (ref 0.0–0.5)
Eosinophils Relative: 0 %
HCT: 35.1 % — ABNORMAL LOW (ref 36.0–46.0)
Hemoglobin: 11.5 g/dL — ABNORMAL LOW (ref 12.0–15.0)
Immature Granulocytes: 1 %
Lymphocytes Relative: 34 %
Lymphs Abs: 2 10*3/uL (ref 0.7–4.0)
MCH: 30.1 pg (ref 26.0–34.0)
MCHC: 32.8 g/dL (ref 30.0–36.0)
MCV: 91.9 fL (ref 80.0–100.0)
Monocytes Absolute: 0.2 10*3/uL (ref 0.1–1.0)
Monocytes Relative: 3 %
Neutro Abs: 3.7 10*3/uL (ref 1.7–7.7)
Neutrophils Relative %: 62 %
Platelets: 175 10*3/uL (ref 150–400)
RBC: 3.82 MIL/uL — ABNORMAL LOW (ref 3.87–5.11)
RDW: 13.6 % (ref 11.5–15.5)
WBC: 6 10*3/uL (ref 4.0–10.5)
nRBC: 0 % (ref 0.0–0.2)

## 2020-02-07 LAB — PROCALCITONIN: Procalcitonin: 28.52 ng/mL

## 2020-02-07 LAB — MAGNESIUM: Magnesium: 1.6 mg/dL — ABNORMAL LOW (ref 1.7–2.4)

## 2020-02-07 LAB — RAPID URINE DRUG SCREEN, HOSP PERFORMED
Amphetamines: NOT DETECTED
Barbiturates: NOT DETECTED
Benzodiazepines: POSITIVE — AB
Cocaine: NOT DETECTED
Opiates: POSITIVE — AB
Tetrahydrocannabinol: NOT DETECTED

## 2020-02-07 LAB — COMPREHENSIVE METABOLIC PANEL
ALT: 50 U/L — ABNORMAL HIGH (ref 0–44)
AST: 101 U/L — ABNORMAL HIGH (ref 15–41)
Albumin: 2.9 g/dL — ABNORMAL LOW (ref 3.5–5.0)
Alkaline Phosphatase: 89 U/L (ref 38–126)
Anion gap: 10 (ref 5–15)
BUN: 14 mg/dL (ref 6–20)
CO2: 20 mmol/L — ABNORMAL LOW (ref 22–32)
Calcium: 7.6 mg/dL — ABNORMAL LOW (ref 8.9–10.3)
Chloride: 107 mmol/L (ref 98–111)
Creatinine, Ser: 0.8 mg/dL (ref 0.44–1.00)
GFR, Estimated: >60 mL/min (ref >60)
Glucose, Bld: 174 mg/dL — ABNORMAL HIGH (ref 70–99)
Potassium: 2.8 mmol/L — ABNORMAL LOW (ref 3.5–5.1)
Sodium: 137 mmol/L (ref 135–145)
Total Bilirubin: 0.7 mg/dL (ref 0.3–1.2)
Total Protein: 6.4 g/dL — ABNORMAL LOW (ref 6.5–8.1)

## 2020-02-07 LAB — FERRITIN: Ferritin: 3852 ng/mL — ABNORMAL HIGH (ref 11–307)

## 2020-02-07 LAB — HIV ANTIBODY (ROUTINE TESTING W REFLEX): HIV Screen 4th Generation wRfx: NONREACTIVE

## 2020-02-07 LAB — MRSA PCR SCREENING: MRSA by PCR: NEGATIVE

## 2020-02-07 LAB — GLUCOSE, CAPILLARY
Glucose-Capillary: 207 mg/dL — ABNORMAL HIGH (ref 70–99)
Glucose-Capillary: 258 mg/dL — ABNORMAL HIGH (ref 70–99)
Glucose-Capillary: 268 mg/dL — ABNORMAL HIGH (ref 70–99)

## 2020-02-07 LAB — C-REACTIVE PROTEIN: CRP: 21.8 mg/dL — ABNORMAL HIGH (ref <1.0)

## 2020-02-07 LAB — D-DIMER, QUANTITATIVE: D-Dimer, Quant: 1.47 ug/mL-FEU — ABNORMAL HIGH (ref 0.00–0.50)

## 2020-02-07 LAB — PHOSPHORUS: Phosphorus: 1.2 mg/dL — ABNORMAL LOW (ref 2.5–4.6)

## 2020-02-07 LAB — STREP PNEUMONIAE URINARY ANTIGEN: Strep Pneumo Urinary Antigen: NEGATIVE

## 2020-02-07 LAB — PREGNANCY, URINE: Preg Test, Ur: NEGATIVE

## 2020-02-07 MED ORDER — DEXMEDETOMIDINE HCL IN NACL 400 MCG/100ML IV SOLN
0.4000 ug/kg/h | INTRAVENOUS | Status: DC
  Administered 2020-02-07: 0.5 ug/kg/h via INTRAVENOUS
  Administered 2020-02-07: 0.8 ug/kg/h via INTRAVENOUS
  Administered 2020-02-07: 1 ug/kg/h via INTRAVENOUS
  Filled 2020-02-07 (×5): qty 100

## 2020-02-07 MED ORDER — POTASSIUM CHLORIDE CRYS ER 20 MEQ PO TBCR
40.0000 meq | EXTENDED_RELEASE_TABLET | Freq: Once | ORAL | Status: AC
  Administered 2020-02-07: 40 meq via ORAL
  Filled 2020-02-07: qty 2

## 2020-02-07 MED ORDER — FENTANYL CITRATE (PF) 100 MCG/2ML IJ SOLN: 25.0000 ug | INTRAMUSCULAR | Status: DC | PRN

## 2020-02-07 MED ORDER — MAGNESIUM SULFATE 2 GM/50ML IV SOLN
2.0000 g | Freq: Once | INTRAVENOUS | Status: AC
  Administered 2020-02-07: 2 g via INTRAVENOUS
  Filled 2020-02-07: qty 50

## 2020-02-07 MED ORDER — ASCORBIC ACID 500 MG PO TABS
500.0000 mg | ORAL_TABLET | Freq: Every day | ORAL | Status: DC
  Administered 2020-02-08: 500 mg via ORAL
  Filled 2020-02-07: qty 1

## 2020-02-07 MED ORDER — CHLORHEXIDINE GLUCONATE CLOTH 2 % EX PADS
6.0000 | MEDICATED_PAD | Freq: Every day | CUTANEOUS | Status: DC
  Administered 2020-02-07 – 2020-03-01 (×28): 6 via TOPICAL

## 2020-02-07 MED ORDER — HALOPERIDOL LACTATE 5 MG/ML IJ SOLN: 5.0000 mg | Freq: Four times a day (QID) | INTRAMUSCULAR | Status: DC | PRN

## 2020-02-07 MED ORDER — SODIUM CHLORIDE 0.9 % IV SOLN
3.0000 g | Freq: Four times a day (QID) | INTRAVENOUS | Status: AC
  Administered 2020-02-07 – 2020-02-11 (×18): 3 g via INTRAVENOUS
  Filled 2020-02-07 (×18): qty 8

## 2020-02-07 MED ORDER — ENSURE ENLIVE PO LIQD
237.0000 mL | Freq: Two times a day (BID) | ORAL | Status: DC
  Filled 2020-02-07 (×4): qty 237

## 2020-02-07 MED ORDER — ENOXAPARIN SODIUM 60 MG/0.6ML ~~LOC~~ SOLN
55.0000 mg | SUBCUTANEOUS | Status: DC
  Administered 2020-02-07 – 2020-02-15 (×9): 55 mg via SUBCUTANEOUS
  Filled 2020-02-07 (×9): qty 0.6

## 2020-02-07 MED ORDER — POTASSIUM CHLORIDE CRYS ER 20 MEQ PO TBCR: 40.0000 meq | EXTENDED_RELEASE_TABLET | ORAL | Status: DC

## 2020-02-07 MED ORDER — FENTANYL CITRATE (PF) 100 MCG/2ML IJ SOLN
25.0000 ug | INTRAMUSCULAR | Status: DC | PRN
  Administered 2020-02-07: 25 ug via INTRAVENOUS
  Filled 2020-02-07: qty 2

## 2020-02-07 MED ORDER — MAGNESIUM SULFATE 4 GM/100ML IV SOLN: 4.0000 g | Freq: Once | INTRAVENOUS | Status: DC

## 2020-02-07 MED ORDER — LACTATED RINGERS IV SOLN: INTRAVENOUS | Status: DC

## 2020-02-07 MED ORDER — HALOPERIDOL LACTATE 5 MG/ML IJ SOLN
4.0000 mg | INTRAMUSCULAR | Status: DC | PRN
  Administered 2020-02-08: 1 mg via INTRAVENOUS
  Administered 2020-02-08: 5 mg via INTRAVENOUS
  Administered 2020-02-08: 4 mg via INTRAVENOUS
  Filled 2020-02-07 (×3): qty 1

## 2020-02-07 MED ORDER — LORAZEPAM 2 MG/ML IJ SOLN
1.0000 mg | INTRAMUSCULAR | Status: DC | PRN
  Administered 2020-02-07 – 2020-02-08 (×2): 1 mg via INTRAVENOUS
  Filled 2020-02-07 (×4): qty 1

## 2020-02-07 MED ORDER — POTASSIUM PHOSPHATES 15 MMOLE/5ML IV SOLN
30.0000 mmol | Freq: Once | INTRAVENOUS | Status: AC
  Administered 2020-02-07: 30 mmol via INTRAVENOUS
  Filled 2020-02-07: qty 10

## 2020-02-07 MED ORDER — BARICITINIB 1 MG PO TABS
4.0000 mg | ORAL_TABLET | Freq: Every day | ORAL | Status: DC
  Administered 2020-02-08: 4 mg via ORAL
  Filled 2020-02-07: qty 4

## 2020-02-07 MED ORDER — HYDROCOD POLST-CPM POLST ER 10-8 MG/5ML PO SUER: 5.0000 mL | Freq: Two times a day (BID) | ORAL | Status: DC | PRN

## 2020-02-07 MED ORDER — ADULT MULTIVITAMIN W/MINERALS CH
1.0000 | ORAL_TABLET | Freq: Every day | ORAL | Status: DC
  Administered 2020-02-08: 1 via ORAL
  Filled 2020-02-07: qty 1

## 2020-02-07 MED ORDER — POTASSIUM CHLORIDE 10 MEQ/100ML IV SOLN
10.0000 meq | INTRAVENOUS | Status: AC
Start: 2020-02-07 — End: 2020-02-07
  Administered 2020-02-07 (×2): 10 meq via INTRAVENOUS
  Filled 2020-02-07: qty 100

## 2020-02-07 MED ORDER — LORAZEPAM 2 MG/ML IJ SOLN
1.0000 mg | Freq: Two times a day (BID) | INTRAMUSCULAR | Status: DC | PRN
  Administered 2020-02-07: 1 mg via INTRAVENOUS
  Filled 2020-02-07 (×2): qty 1

## 2020-02-07 MED ORDER — ZINC SULFATE 220 (50 ZN) MG PO CAPS
220.0000 mg | ORAL_CAPSULE | Freq: Every day | ORAL | Status: DC
  Administered 2020-02-08: 220 mg via ORAL
  Filled 2020-02-07: qty 1

## 2020-02-07 MED ORDER — IPRATROPIUM-ALBUTEROL 20-100 MCG/ACT IN AERS
1.0000 | INHALATION_SPRAY | Freq: Four times a day (QID) | RESPIRATORY_TRACT | Status: DC
  Filled 2020-02-07: qty 4

## 2020-02-07 MED ORDER — PANTOPRAZOLE SODIUM 40 MG IV SOLR: 40.0000 mg | INTRAVENOUS | Status: DC

## 2020-02-07 MED ORDER — MAGNESIUM SULFATE 2 GM/50ML IV SOLN: 2.0000 g | Freq: Once | INTRAVENOUS | Status: DC

## 2020-02-07 NOTE — Progress Notes (Signed)
Pharmacy Antibiotic Note  Belinda [name] is a 39 y.o. female admitted on 02/06/2020 with pneumonia / COVID .  Pharmacy has been consulted for Unasyn dosing.  Plan: Unasyn 3 grams iv Q 6 hours Pharmacy to sign off and follow peripherally for changes in renal function   Height: 5' 3"  (160 cm) Weight: 113.4 kg (250 lb) IBW/kg (Calculated) : 52.4  Temp (24hrs), Avg:99.5 F (37.5 C), Min:98.8 F (37.1 C), Max:103.2 F (39.6 C)  Recent Labs  Lab 02/06/20 1834 02/06/20 2019 02/07/20 0513  WBC 6.3  --  6.0  CREATININE 1.15*  --  0.80  LATICACIDVEN 1.5 1.2  --     Estimated Creatinine Clearance: 115.6 mL/min (by C-G formula based on SCr of 0.8 mg/dL).    No Known Allergies    Thank you for allowing pharmacy to be a part of this patient's care.  Tad Moore 02/07/2020 3:14 PM

## 2020-02-07 NOTE — ED Notes (Signed)
Pt very agitated, yelling and moving about the bed. Multiple attempts to calm pt verbally, cover with blankets, reposition patient. Dr. Denton Brick notified twice. No new orders received at this time.

## 2020-02-07 NOTE — ED Notes (Signed)
Pt is restless and continues to pull off O2, requires frequent redirection. Oriented to name only. Otherwise makes incomprehensible sounds.

## 2020-02-07 NOTE — ED Notes (Signed)
Attempted to given patient oral medications. She spits them out and tries to bite nurses hand while pinching this nurse with her right hand. Dr. Denton Brick notified.

## 2020-02-07 NOTE — ED Notes (Signed)
Pt dislodged left hand IV getting blood on self, bed, floor, and cabinets. Full bath given including pericare. Reported to attending. Orders received for sitter.

## 2020-02-07 NOTE — Progress Notes (Addendum)
Patient Demographics:    Belinda [name], is a 39 y.o. female, DOB - 04-Jun-1980, VEH:[id number]  Admit date - 02/06/2020   Admitting Physician Tayden Nichelson Denton Brick, MD  Outpatient Primary MD for the patient is Kathyrn Drown, MD  LOS - 1   Chief Complaint  Patient presents with  . Altered Mental Status        Subjective:    Belinda [name] today has significant confusion, agitation and restlessness -Requiring as needed Haldol, Ativan,  Assessment  & Plan :    Active Problems:   Pneumonia due to COVID-19 virus   Sepsis ([place])   Acute metabolic encephalopathy   Hypokalemia   Hypoalbuminemia   Elevated liver enzymes  Brief Summary:-  Chief Complaint: Altered mental status  HPI: Belinda [name] is a 39 y.o. female with significant medical history of Morbid obesity who presents to the emergency department via EMS due to altered mental status and fever.  Patient was unable to provide history due to altered mental status, history was obtained from ED physician and ED medical record.  Per report, patient has had altered mental status for 24 to 48 hours PTA, she lives with son who had Covid, patient was reported to have been in her room for more than a day, son initially thought that she was just resting, on checking on her, he realized that her mental status was altered and EMS was activated.  On arrival of EMS, dried vomit was noted near her and she was hypotensive, IV NS of 200 mL was given in route and patient was reported to be slightly combative in route to the hospital.  ED Course:  In the emergency department, she was febrile with a temperature of 103.87F, tachypneic and intermittently tachycardic.  She was hypoxic with an O2 sat of 80% on room air and required HFNC 15 L to obtain O2 sat in the 90s.  Work-up in the ED showed normal CBC, hypokalemia, hypoalbuminemia and elevated liver enzymes.  Urinalysis  was positive for moderate hemoglobin, ketones, protein (>/= 300) and many bacteria.  SARS coronavirus 2 was positive. Chest x-ray showed multifocal bilateral airspace opacities concerning for multifocal infection CT of head shows no acute intracranial abnormality Patient was started on IV cefepime, Flagyl and vancomycin.  IV Decadron and remdesivir was started --Patient remains confused, agitated, restless and hypoxic -Required as needed Haldol and Ativan, may use Precedex drip -PCCM consult from Dr. Christinia Gully appreciated -Plan is to transfer to [facility], ICU under PCCM care   A/p 1)Acute hypoxic respiratory failure secondary to COVID-19 infection/Pneumonia--- The treatment plan and use of medications  for treatment of COVID-19 infection and possible side effects were discussed with family -ABG noted -Currently requiring nonrebreather bag/high flow nasal cannula at 15 L -----Family verbalizes understanding and agrees to treatment protocols   --Patient is positive for COVID-19 infection, chest x-ray with findings of infiltrates/opacities,  patient is tachypneic/hypoxic and requiring continuous supplemental oxygen---patient meets criteria for initiation of Remdesivir AND Steroid therapy per protocol , continue baricitinib --Check and trend inflammatory markers including D-dimer, ferritin and  CRP---also follow CBC and CMP --Supplemental oxygen to keep O2 sats above 93% -Currently requiring nonrebreather bag/HFNC oxygen -Follow serial chest x-rays and ABGs as indicated --- Encourage  prone positioning for More than 16 hours/day in increments of 2 to 3 hours at a time if able to tolerate --Attempt to maintain euvolemic state --Zinc and vitamin C as ordered -Albuterol inhaler as needed -Accu-Cheks/fingersticks while on high-dose steroids -Protonix while on high-dose steroids -Continue IV antibiotics--Vanco and cefepime for UTI and possible concomitant bacterial pneumonia --strep pneumo and  Legionella antigen pending, procalcitonin elevated at 28.5 -Okay to stop Flagyl --Blood  And urine cultures obtained --Enhanced dosage of anticoagulant for DVT prophylaxis given hypercoagulable state with COVID-19 infections--Lovenox 0.5 mg/kg dose COVID-19 Labs  Recent Labs    02/06/20 2019 02/07/20 0513  DDIMER  --  1.47*  FERRITIN 3,852*  --   CRP 21.8*  --     Lab Results  Component Value Date   SARSCOV2NAA POSITIVE (A) 02/06/2020   2)Hypokalemia and Hypomagnesemia--- replete and recheck---- there was report of emesis PTA  3) acute metabolic encephalopathy--- secondary to #1 above -Requiring lorazepam, Haldol and may need Precedex  4) elevated LFTs--- patient had hypotension PTA -?? shock liver, monitor closely and consider liver ultrasound and/or acute viral hepatitis profile  5)Morbid Obesity-this complicates overall care and increases mortality and morbidity -=-Body mass index is 44.29 kg/m.  6) sepsis -secondary to presumed UTI with possible pneumonia----- _-----procalcitonin elevated at 28.5 management as above #1  CRITICAL CARE Performed by: Roxan Hockey   Total critical care time: 94mnutes  Critical care time was exclusive of separately billable procedures and treating other patients.  Critical care was necessary to treat or prevent imminent or life-threatening deterioration.  --Persistent hypoxia, sepsis pathophysiology and significant altered mentation requiring frequent interventions  Critical care was time spent personally by me on the following activities: development of treatment plan with patient and/or surrogate as well as nursing, discussions with consultants, evaluation of patient's response to treatment, examination of patient, obtaining history from patient or surrogate, ordering and performing treatments and interventions, ordering and review of laboratory studies, ordering and review of radiographic studies, pulse oximetry and re-evaluation of  patient's condition.    Disposition/Need for in-Hospital Stay- patient unable to be discharged at this time due to --Plan is to transfer to [facility] ICU under PCCM care  Status is: Inpatient  Remains inpatient appropriate because:-Plan is to transfer to [facility] ICU under PCCM care  Disposition: The patient is from: Home              Anticipated d/c is to: TBD              Anticipated d/c date is: > 3 days              Patient currently is not medically stable to d/c. Barriers: Not Clinically Stable- -Plan is to transfer to [facility] ICU under PCCM care  Code Status :  -  Code Status: Full Code   Family Communication:   Discussed with pt's Mother---Ms Diane Hollinghead--[phone]  Consults  :  *PCCM  DVT Prophylaxis  :   - SCDs -  SCDs Start: 02/06/20 2259  - Lovenox   Lab Results  Component Value Date   PLT 175 02/07/2020    Inpatient Medications  Scheduled Meds: . vitamin C  500 mg Oral Daily  . baricitinib  4 mg Oral Daily  . enoxaparin (LOVENOX) injection  55 mg Subcutaneous Q24H  . feeding supplement  237 mL Oral BID BM  . insulin aspart  0-9 Units Subcutaneous Q4H  . Ipratropium-Albuterol  1 puff Inhalation Q6H  .  methylPREDNISolone (SOLU-MEDROL) injection  0.5 mg/kg Intravenous Q12H   Followed by  . [START ON 02/10/2020] predniSONE  50 mg Oral Daily  . multivitamin with minerals  1 tablet Oral Daily  . potassium chloride  40 mEq Oral Q3H  . zinc sulfate  220 mg Oral Daily   Continuous Infusions: . ceFEPime (MAXIPIME) IV Stopped (02/07/20 0515)  . lactated ringers 150 mL/hr at 02/06/20 2253  . magnesium sulfate bolus IVPB    . remdesivir 100 mg in NS 100 mL Stopped (02/07/20 1116)  . vancomycin Stopped (02/07/20 1155)   PRN Meds:.acetaminophen, chlorpheniramine-HYDROcodone, fentaNYL (SUBLIMAZE) injection, guaiFENesin-dextromethorphan, haloperidol lactate, LORazepam, ondansetron **OR** ondansetron (ZOFRAN) IV    Anti-infectives (From  admission, onward)   Start     Dose/Rate Route Frequency Ordered Stop   02/07/20 1000  remdesivir 100 mg in sodium chloride 0.9 % 100 mL IVPB        100 mg 200 mL/hr over 30 Minutes Intravenous Daily 02/06/20 2132 02/11/20 0959   02/07/20 0800  vancomycin (VANCOCIN) IVPB 750 mg/150 ml premix        750 mg 150 mL/hr over 60 Minutes Intravenous Every 12 hours 02/06/20 2023     02/07/20 0400  ceFEPIme (MAXIPIME) 2 g in sodium chloride 0.9 % 100 mL IVPB        2 g 200 mL/hr over 30 Minutes Intravenous Every 8 hours 02/06/20 2023     02/06/20 2145  remdesivir 100 mg in sodium chloride 0.9 % 100 mL IVPB        100 mg 200 mL/hr over 30 Minutes Intravenous Every 30 min 02/06/20 2132 02/06/20 2305   02/06/20 1915  vancomycin (VANCOREADY) IVPB 2000 mg/400 mL        2,000 mg 200 mL/hr over 120 Minutes Intravenous  Once 02/06/20 1907 02/06/20 2250   02/06/20 1845  ceFEPIme (MAXIPIME) 2 g in sodium chloride 0.9 % 100 mL IVPB        2 g 200 mL/hr over 30 Minutes Intravenous  Once 02/06/20 1834 02/06/20 2056   02/06/20 1845  metroNIDAZOLE (FLAGYL) IVPB 500 mg        500 mg 100 mL/hr over 60 Minutes Intravenous  Once 02/06/20 1834 02/06/20 2204   02/06/20 1845  vancomycin (VANCOCIN) IVPB 1000 mg/200 mL premix  Status:  Discontinued        1,000 mg 200 mL/hr over 60 Minutes Intravenous  Once 02/06/20 1834 02/06/20 1907        Objective:   Vitals:   02/07/20 1100 02/07/20 1115 02/07/20 1130 02/07/20 1200  BP: 119/76  115/71 (!) 111/91  Pulse: (!) 108 (!) 106  (!) 109  Resp: (!) 25 (!) 21 (!) 26 (!) 24  Temp: 99.9 F (37.7 C) 99.9 F (37.7 C) 99.7 F (37.6 C) 99.7 F (37.6 C)  TempSrc:      SpO2: 92% 92%  (!) 89%  Weight:      Height:        Wt Readings from Last 3 Encounters:  02/06/20 113.4 kg     Intake/Output Summary (Last 24 hours) at 02/07/2020 1333 Last data filed at 02/07/2020 1155 Gross per 24 hour  Intake 700 ml  Output 1100 ml  Net -400 ml     Physical  Exam  Gen:- Awake , very confused, agitated HEENT:- [place].AT, No sclera icterus Nose- NRB /HFNC 15L/min Neck-Supple Neck,No JVD,.  Lungs-diminished breath sounds, few scattered wheeze CV- S1, S2 normal, regular  Abd-  +ve B.Sounds, Abd  Soft, No tenderness, increased truncal adiposity Extremity/Skin:- No  edema, pedal pulses present  Psych-confused, disoriented, and very agitated from time to time  neuro-no new focal deficits, limited exam as patient is confused and not cooperative  Data Review:   Micro Results Recent Results (from the past 240 hour(s))  Blood Culture (routine x 2)     Status: None (Preliminary result)   Collection Time: 02/06/20  6:34 PM   Specimen: BLOOD  Result Value Ref Range Status   Specimen Description BLOOD LEFT ANTECUBITAL  Final   Special Requests   Final    BOTTLES DRAWN AEROBIC AND ANAEROBIC Blood Culture adequate volume   Culture   Final    NO GROWTH < 12 HOURS Performed at [facility], [address], [place], [place] [address]    Report Status PENDING  Incomplete  Resp Panel by RT-PCR (Flu A&B, Covid)     Status: Abnormal   Collection Time: 02/06/20  8:00 PM   Specimen: Nasopharyngeal(NP) swabs in vial transport medium  Result Value Ref Range Status   SARS Coronavirus 2 by RT PCR POSITIVE (A) NEGATIVE Final    Comment: RESULT CALLED TO, READ BACK BY AND VERIFIED WITH: SPENCE,H @ 2124 ON 02/06/20 BY JUW (NOTE) SARS-CoV-2 target nucleic acids are DETECTED.  The SARS-CoV-2 RNA is generally detectable in upper respiratory specimens during the acute phase of infection. Positive results are indicative of the presence of the identified virus, but do not rule out bacterial infection or co-infection with other pathogens not detected by the test. Clinical correlation with patient history and other diagnostic information is necessary to determine patient infection status. The expected result is Negative.  Fact Sheet for  Patients: EntrepreneurPulse.com.au  Fact Sheet for Healthcare Providers: IncredibleEmployment.be  This test is not yet approved or cleared by the [place] FDA and  has been authorized for detection and/or diagnosis of SARS-CoV-2 by FDA under an Emergency Use Authorization (EUA).  This EUA will remain in effect (meaning this test can b e used) for the duration of  the COVID-19 declaration under Section 564(b)(1) of the Act, 21 U.S.C. section 360bbb-3(b)(1), unless the authorization is terminated or revoked sooner.     Influenza A by PCR NEGATIVE NEGATIVE Final   Influenza B by PCR NEGATIVE NEGATIVE Final    Comment: (NOTE) The Xpert Xpress SARS-CoV-2/FLU/RSV plus assay is intended as an aid in the diagnosis of influenza from Nasopharyngeal swab specimens and should not be used as a sole basis for treatment. Nasal washings and aspirates are unacceptable for Xpert Xpress SARS-CoV-2/FLU/RSV testing.  Fact Sheet for Patients: EntrepreneurPulse.com.au  Fact Sheet for Healthcare Providers: IncredibleEmployment.be  This test is not yet approved or cleared by the [place] FDA and has been authorized for detection and/or diagnosis of SARS-CoV-2 by FDA under an Emergency Use Authorization (EUA). This EUA will remain in effect (meaning this test can be used) for the duration of the COVID-19 declaration under Section 564(b)(1) of the Act, 21 U.S.C. section 360bbb-3(b)(1), unless the authorization is terminated or revoked.  Performed at [facility], [address], [place],  [address]   Blood Culture (routine x 2)     Status: None (Preliminary result)   Collection Time: 02/06/20  8:19 PM   Specimen: BLOOD RIGHT HAND  Result Value Ref Range Status   Specimen Description BLOOD RIGHT HAND  Final   Special Requests   Final    BOTTLES DRAWN AEROBIC AND ANAEROBIC Blood Culture adequate volume   Culture  Final    NO GROWTH < 12 HOURS Performed at [facility], [address] [place], [place] [address]    Report Status PENDING  Incomplete    Radiology Reports CT Head Wo Contrast  Result Date: 02/06/2020 CLINICAL DATA:  Altered mental status.  Recent tooth extraction. EXAM: CT HEAD WITHOUT CONTRAST TECHNIQUE: Contiguous axial images were obtained from the base of the skull through the vertex without intravenous contrast. COMPARISON:  Head CT 09/01/2017 FINDINGS: Brain: No intracranial hemorrhage, mass effect, or midline shift. No hydrocephalus. The basilar cisterns are patent. No evidence of territorial infarct or acute ischemia. No extra-axial or intracranial fluid collection. Vascular: No hyperdense vessel or unexpected calcification. Skull: No fracture or focal lesion. Sinuses/Orbits: Mucosal thickening of the right greater than left maxillary sinus. Scattered mucosal thickening throughout ethmoid air cells. Patient appears edentulous of upper teeth, presumed prior periapical lucency in the right upper molars, contiguous with the right maxillary sinus. No acute orbital abnormality. Mastoid air cells are clear. Other: None. IMPRESSION: 1. No acute intracranial abnormality. 2. Paranasal sinus disease. 3. Patient appears edentulous of upper teeth, presumed prior periapical lucency in the right upper molars, contiguous with the right maxillary sinus. CLINICAL DATA:  Altered mental status. Recent tooth extraction. EXAM: CT HEAD WITHOUT CONTRAST TECHNIQUE: Contiguous axial images were obtained from the base of the skull through the vertex without intravenous contrast. COMPARISON:  Head CT 09/01/2017 FINDINGS: Brain: No intracranial hemorrhage, mass effect, or midline shift. No hydrocephalus. The basilar cisterns are patent. No evidence of territorial infarct or acute ischemia. No extra-axial or intracranial fluid collection. Vascular: No hyperdense vessel or unexpected calcification. Skull: No fracture or  focal lesion. Sinuses/Orbits: Mucosal thickening of the right greater than left maxillary sinus. Scattered mucosal thickening throughout ethmoid air cells. Patient appears edentulous of upper teeth, presumed prior periapical lucency in the right upper molars, contiguous with the right maxillary sinus. No acute orbital abnormality. Mastoid air cells are clear. Other: None. IMPRESSION: 1. No acute intracranial abnormality. 2. Paranasal sinus mucosal thickening. 3. Patient appears edentulous of upper teeth, presumed prior periodontal disease in the right upper molars, contiguous with the right maxillary sinus. Electronically Signed   By: Keith Rake M.D.   On: 02/06/2020 22:18   DG Chest Port 1 View  Result Date: 02/06/2020 CLINICAL DATA:  Sepsis.  Encephalopathy. EXAM: PORTABLE CHEST 1 VIEW COMPARISON:  None. FINDINGS: Multifocal airspace opacity throughout both lungs. Small pleural effusions. No pneumothorax. IMPRESSION: Multifocal bilateral airspace opacities concerning for multifocal infection. Electronically Signed   By: Ulyses Jarred M.D.   On: 02/06/2020 19:36     CBC Recent Labs  Lab 02/06/20 1834 02/07/20 0513  WBC 6.3 6.0  HGB 12.8 11.5*  HCT 38.5 35.1*  PLT 187 175  MCV 91.4 91.9  MCH 30.4 30.1  MCHC 33.2 32.8  RDW 13.3 13.6  LYMPHSABS 2.2 2.0  MONOABS 0.3 0.2  EOSABS 0.0 0.0  BASOSABS 0.0 0.0    Chemistries  Recent Labs  Lab 02/06/20 1834 02/07/20 0513  NA 140 137  K 2.6* 2.8*  CL 107 107  CO2 20* 20*  GLUCOSE 132* 174*  BUN 19 14  CREATININE 1.15* 0.80  CALCIUM 8.0* 7.6*  MG  --  1.6*  AST 111* 101*  ALT 56* 50*  ALKPHOS 108 89  BILITOT 0.8 0.7   ------------------------------------------------------------------------------------------------------------------ No results for input(s): CHOL, HDL, LDLCALC, TRIG, CHOLHDL, LDLDIRECT in the last 72 hours.  No results found for:  HGBA1C ------------------------------------------------------------------------------------------------------------------ No results  for input(s): TSH, T4TOTAL, T3FREE, THYROIDAB in the last 72 hours.  Invalid input(s): FREET3 ------------------------------------------------------------------------------------------------------------------ Recent Labs    02/06/20 2019  FERRITIN 3,852*    Coagulation profile Recent Labs  Lab 02/06/20 1834  INR 1.1    Recent Labs    02/07/20 0513  DDIMER 1.47*    Cardiac Enzymes No results for input(s): CKMB, TROPONINI, MYOGLOBIN in the last 168 hours.  Invalid input(s): CK ------------------------------------------------------------------------------------------------------------------ No results found for: BNP   Roxan Hockey M.D on 02/07/2020 at 1:33 PM  Go to [email or web address] - for contact info  [facility] - Office  [phone]

## 2020-02-07 NOTE — ED Notes (Signed)
Call to ICU, report given to [facility]. Family also also updated that pt will be moving to [facility] ICU.

## 2020-02-07 NOTE — ED Notes (Addendum)
Pt continues to pull off O2 by shaking head or leaning over to her restrained hand and pulling it off. Mitt[place] applied Pt cannot be redirected. Keeps saying "I'm sorry" and is tearful. Oriented to name. Sitter assigned to patient for safety.

## 2020-02-07 NOTE — Plan of Care (Signed)

## 2020-02-07 NOTE — Progress Notes (Signed)
Pt seen on am rounds at request of Dr Lake Bells with dx of covid 19 pna and severe encephalopathy.   She is  agitated on may arrival, not responding to commands and attempting to climb off stretcher and remove 02 but earlier sedated with low dose ativan IM and tolerated   Rec:  Try haldol/fentanyl low dose and if not effective can try precedex but if at any point if affects ability to oxygenate adversely (should actually help to reduce entrainment of 21% 02) then will need ET prior to transport to [facility]   Dr Lake Bells aware.    Christinia Gully, MD Pulmonary and [place] [phone]   After 7:00 pm call Elink  [phone]

## 2020-02-07 NOTE — Progress Notes (Signed)
Diane Hollinghead mother updated on plan of care  Erick Colace ACNP-BC [place] Pager # [phone] OR # [phone] if no answer

## 2020-02-07 NOTE — H&P (Addendum)
NAME:  Belinda [name], MRN:  [id number], DOB:  01/05/1981, LOS: 1 ADMISSION DATE:  02/06/2020, CONSULTATION DATE:  12/16 REFERRING MD:  Josephine Cables ([facility]), CHIEF COMPLAINT:  Acute metabolic encephalopathy and respiratory failure in setting of COVID   History of Present Illness:  39 year old white female, only medical history is obesity.  Presented to [facility] ER 12/15 with 24 to 48 hours of altered mental status following exposure to her Covid positive son. EMS was called on 12/15 when he found her confused and delirious.  On EMS arrival she was hypotensive In ER febrile room air pulse oximetry 80% Covid positive x-ray with diffuse bilateral airspace disease she was confused and agitated Requiring frequent redirection, physical restraints and ultimately Precedex infusion.  As she required 100% nonrebreather mask was transferred to [facility] for further therapy Past Medical History:  Obesity, anxiety, childhood asthma  Significant Hospital Events:  12/15 presented to the emergency room, hypoxic, encephalopathic, Covid positive.CT brain negative for acute injury with only some mild para sinus mucosal thickening with some periodontal disease.  Started on supplemental oxygen, IV Solu-Medrol, IV remdesivir, and baricitinib.  Also started empirically on cefepime  and vancomycin to cover for potential bacterial pneumonia 12/16 multiple reports of worsening confusion, intermittent combativeness, attempts to bite staff.  Not able to take oral medications.  Started on Precedex.  Transferred to [facility]. Changed abx to unasyn, added low dose ativan as pt chronically on xanax.   Consults:  Not applicable  Procedures:    Significant Diagnostic Tests:  CT brain 12/15: Negative for acute CVA or acute process  Micro Data:  RSA PCR 12/15 urine culture>> Blood culture 12/15>>> Respiratory panel by RT-PCR 12/15: Positive for Covid   Antimicrobials:  Remdesivir 12/15 Cefepime 12/15-->12/16 Vancomycin  12/15-->12/16 unasyn 12/16>>>   Interim History / Subjective:  Awake, restless on precedex  Objective   Blood pressure 124/88, pulse (Abnormal) 109, temperature 99.7 F (37.6 C), temperature source Axillary, resp. rate (Abnormal) 22, height 5' 3"  (1.6 m), weight 113.4 kg, SpO2 94 %.        Intake/Output Summary (Last 24 hours) at 02/07/2020 1402 Last data filed at 02/07/2020 1155 Gross per 24 hour  Intake 700 ml  Output 1100 ml  Net -400 ml   Filed Weights   02/06/20 1853  Weight: 113.4 kg    Examination: General: 39 year old white female. Agitated. Restless. Moves all extremities.  HENT: MM are dry, sclera non-icteric. No JVD Lungs: RR 30s currently on salter high flow at 15 liters [place], dec bases but no sig accessory use. sats 92%  Cardiovascular: RRR tachycardic  Abdomen: soft not tender  Extremities: warm dry brisk CR multiple red/raised scaly patches over both legs  Neuro: confused moves all ext GU: cl yellow   Resolved Hospital Problem list     Assessment & Plan:  Acute hypoxic respiratory failure in setting of Covid pneumonia, and ARDS, plus minus CAP versus aspiration Portable chest x-ray personally reviewed demonstrates diffuse bilateral airspace disease Currently very high supplemental oxygen requirements, very high risk for intubation Plan Day #2 systemic steroids, will initiate taper at day 5 Day #2 of 5 remdesivir Day #2 baricitinib Continue supplemental oxygen Continue pulse oximetry Given delirium and agitation not a candidate for NIPPV We will change antibiotic coverage to Unasyn for possible aspiration  Checking daily inflammatory markers  SIRS/sepsis secondary to Covid Ruling out secondary bacterial process Plan Continue maintenance IV fluids Follow-up culture data Follow-up urinary strep and Legionella antigen ICU  admission  Acute metabolic encephalopathy in the setting of sepsis and Covid infection.  Suspect exacerbated by hypoxia.  Urine  drug screen is positive for opiates and benzos; likely given in ER but was on chronic xanax so ? wd Plan Supportive care Cont precedex Add low dose ativan (given h/o chronic xanax)  Fluid and electrolyte imbalance: hypomagnesemia, hypophosphatemia, and hypokalemia Plan Repeat   Mild elevated LFTs Plan Repeat in am  Need to watch while giving Remdesivir  Mild anemia Plan Trend cbc  Hyperglycemia Plan ssi  Best practice (evaluated daily)  Diet: Nothing by mouth Pain/Anxiety/Delirium protocol (if indicated): Not applicable currently VAP protocol (if indicated): Not applicable currently DVT prophylaxis: Lovenox GI prophylaxis: Not applicable currently Glucose control: Sliding scale insulin Mobility: Bedrest currently requiring restraints Disposition: Intensive care  Goals of Care:  Last date of multidisciplinary goals of care discussion: via phone so not able to do multidisciplinary meeting  Family and staff present: NP and mother dianne  Summary of discussion: Full code  Follow up goals of care discussion due: Goals of care discussion due 12/23 Code Status: Full code  Labs   CBC: Recent Labs  Lab 02/06/20 1834 02/07/20 0513  WBC 6.3 6.0  NEUTROABS 3.8 3.7  HGB 12.8 11.5*  HCT 38.5 35.1*  MCV 91.4 91.9  PLT 187 539    Basic Metabolic Panel: Recent Labs  Lab 02/06/20 1834 02/07/20 0513  NA 140 137  K 2.6* 2.8*  CL 107 107  CO2 20* 20*  GLUCOSE 132* 174*  BUN 19 14  CREATININE 1.15* 0.80  CALCIUM 8.0* 7.6*  MG  --  1.6*  PHOS  --  1.2*   GFR: Estimated Creatinine Clearance: 115.6 mL/min (by C-G formula based on SCr of 0.8 mg/dL). Recent Labs  Lab 02/06/20 1834 02/06/20 2019 02/07/20 0513  PROCALCITON  --  28.52  --   WBC 6.3  --  6.0  LATICACIDVEN 1.5 1.2  --     Liver Function Tests: Recent Labs  Lab 02/06/20 1834 02/07/20 0513  AST 111* 101*  ALT 56* 50*  ALKPHOS 108 89  BILITOT 0.8 0.7  PROT 7.2 6.4*  ALBUMIN 3.2* 2.9*   No  results for input(s): LIPASE, AMYLASE in the last 168 hours. No results for input(s): AMMONIA in the last 168 hours.  ABG    Component Value Date/Time   PHART 7.372 02/07/2020 0912   PCO2ART 40.3 02/07/2020 0912   PO2ART 66.5 (L) 02/07/2020 0912   HCO3 22.9 02/07/2020 0912   ACIDBASEDEF 1.8 02/07/2020 0912   O2SAT 87.6 02/07/2020 0912     Coagulation Profile: Recent Labs  Lab 02/06/20 1834  INR 1.1    Cardiac Enzymes: No results for input(s): CKTOTAL, CKMB, CKMBINDEX, TROPONINI in the last 168 hours.  HbA1C: No results found for: HGBA1C  CBG: Recent Labs  Lab 02/06/20 1931 02/07/20 0750 02/07/20 1202  GLUCAP 145* 170* 198*    Review of Systems:   Not able  Past Medical History:  She,  has no past medical history on file.   Surgical History:  none   Social History:      Family History:  Her family history is not on file.   Allergies No Known Allergies   Home Medications  Prior to Admission medications   Not on File     Critical care time: 32 minutes     Erick Colace ACNP-BC [place] Pager # [phone] OR # [phone] if no answer

## 2020-02-07 NOTE — ED Notes (Signed)
Pt requires frequent redirection, keeps pulling o2 off and sats drop to mid 80s.

## 2020-02-07 NOTE — Plan of Care (Signed)
  Problem: Education: Goal: Knowledge of General Education information will improve Description: Including pain rating scale, medication(s)/side effects and non-pharmacologic comfort measures Outcome: Progressing   Problem: Health Behavior/Discharge Planning: Goal: Ability to manage health-related needs will improve Outcome: Progressing   Problem: Clinical Measurements: Goal: Ability to maintain clinical measurements within normal limits will improve Outcome: Progressing Goal: Will remain free from infection Outcome: Progressing Goal: Diagnostic test results will improve Outcome: Progressing Goal: Respiratory complications will improve Outcome: Progressing Goal: Cardiovascular complication will be avoided Outcome: Progressing   Problem: Activity: Goal: Risk for activity intolerance will decrease Outcome: Progressing   Problem: Nutrition: Goal: Adequate nutrition will be maintained Outcome: Progressing   Problem: Coping: Goal: Level of anxiety will decrease Outcome: Progressing   Problem: Elimination: Goal: Will not experience complications related to bowel motility Outcome: Progressing Goal: Will not experience complications related to urinary retention Outcome: Progressing   Problem: Pain Managment: Goal: General experience of comfort will improve Outcome: Progressing   Problem: Safety: Goal: Ability to remain free from injury will improve Outcome: Progressing   Problem: Skin Integrity: Goal: Risk for impaired skin integrity will decrease Outcome: Progressing   Problem: Education: Goal: Knowledge of risk factors and measures for prevention of condition will improve Outcome: Progressing   Problem: Coping: Goal: Psychosocial and spiritual needs will be supported Outcome: Progressing   Problem: Respiratory: Goal: Will maintain a patent airway Outcome: Progressing Goal: Complications related to the disease process, condition or treatment will be avoided or  minimized Outcome: Progressing   Problem: Safety: Goal: Non-violent Restraint(s) Outcome: Progressing

## 2020-02-08 ENCOUNTER — Encounter (HOSPITAL_COMMUNITY): Payer: Self-pay | Admitting: Pulmonary Disease

## 2020-02-08 ENCOUNTER — Inpatient Hospital Stay (HOSPITAL_COMMUNITY): Payer: Medicaid Other

## 2020-02-08 ENCOUNTER — Other Ambulatory Visit: Payer: Self-pay

## 2020-02-08 DIAGNOSIS — J9600 Acute respiratory failure, unspecified whether with hypoxia or hypercapnia: Secondary | ICD-10-CM

## 2020-02-08 DIAGNOSIS — Z4682 Encounter for fitting and adjustment of non-vascular catheter: Secondary | ICD-10-CM | POA: Diagnosis not present

## 2020-02-08 DIAGNOSIS — U071 COVID-19: Secondary | ICD-10-CM | POA: Diagnosis not present

## 2020-02-08 DIAGNOSIS — J984 Other disorders of lung: Secondary | ICD-10-CM | POA: Diagnosis not present

## 2020-02-08 DIAGNOSIS — J9601 Acute respiratory failure with hypoxia: Secondary | ICD-10-CM

## 2020-02-08 DIAGNOSIS — Z452 Encounter for adjustment and management of vascular access device: Secondary | ICD-10-CM | POA: Diagnosis not present

## 2020-02-08 DIAGNOSIS — J80 Acute respiratory distress syndrome: Secondary | ICD-10-CM | POA: Diagnosis not present

## 2020-02-08 LAB — CBC WITH DIFFERENTIAL/PLATELET
Abs Immature Granulocytes: 0.08 10*3/uL — ABNORMAL HIGH (ref 0.00–0.07)
Basophils Absolute: 0 10*3/uL (ref 0.0–0.1)
Basophils Relative: 0 %
Eosinophils Absolute: 0 10*3/uL (ref 0.0–0.5)
Eosinophils Relative: 0 %
HCT: 33.7 % — ABNORMAL LOW (ref 36.0–46.0)
Hemoglobin: 11.4 g/dL — ABNORMAL LOW (ref 12.0–15.0)
Immature Granulocytes: 1 %
Lymphocytes Relative: 21 %
Lymphs Abs: 2 10*3/uL (ref 0.7–4.0)
MCH: 29.9 pg (ref 26.0–34.0)
MCHC: 33.8 g/dL (ref 30.0–36.0)
MCV: 88.5 fL (ref 80.0–100.0)
Monocytes Absolute: 0.3 10*3/uL (ref 0.1–1.0)
Monocytes Relative: 3 %
Neutro Abs: 7 10*3/uL (ref 1.7–7.7)
Neutrophils Relative %: 75 %
Platelets: 216 10*3/uL (ref 150–400)
RBC: 3.81 MIL/uL — ABNORMAL LOW (ref 3.87–5.11)
RDW: 13.5 % (ref 11.5–15.5)
WBC: 9.4 10*3/uL (ref 4.0–10.5)
nRBC: 0.3 % — ABNORMAL HIGH (ref 0.0–0.2)

## 2020-02-08 LAB — C-REACTIVE PROTEIN: CRP: 6.3 mg/dL — ABNORMAL HIGH (ref <1.0)

## 2020-02-08 LAB — POCT I-STAT 7, (LYTES, BLD GAS, ICA,H+H)
Acid-Base Excess: 0 mmol/L (ref 0.0–2.0)
Acid-Base Excess: 2 mmol/L (ref 0.0–2.0)
Bicarbonate: 26.2 mmol/L (ref 20.0–28.0)
Bicarbonate: 28.5 mmol/L — ABNORMAL HIGH (ref 20.0–28.0)
Calcium, Ion: 1.11 mmol/L — ABNORMAL LOW (ref 1.15–1.40)
Calcium, Ion: 1.13 mmol/L — ABNORMAL LOW (ref 1.15–1.40)
HCT: 29 % — ABNORMAL LOW (ref 36.0–46.0)
HCT: 29 % — ABNORMAL LOW (ref 36.0–46.0)
Hemoglobin: 9.9 g/dL — ABNORMAL LOW (ref 12.0–15.0)
Hemoglobin: 9.9 g/dL — ABNORMAL LOW (ref 12.0–15.0)
O2 Saturation: 94 %
O2 Saturation: 99 %
Patient temperature: 101.7
Patient temperature: 100.5
Potassium: 3.6 mmol/L (ref 3.5–5.1)
Potassium: 3.9 mmol/L (ref 3.5–5.1)
Sodium: 144 mmol/L (ref 135–145)
Sodium: 145 mmol/L (ref 135–145)
TCO2: 28 mmol/L (ref 22–32)
TCO2: 30 mmol/L (ref 22–32)
pCO2 arterial: 54.8 mmHg — ABNORMAL HIGH (ref 32.0–48.0)
pCO2 arterial: 58.4 mmHg — ABNORMAL HIGH (ref 32.0–48.0)
pH, Arterial: 7.295 — ABNORMAL LOW (ref 7.350–7.450)
pH, Arterial: 7.301 — ABNORMAL LOW (ref 7.350–7.450)
pO2, Arterial: 88 mmHg (ref 83.0–108.0)
pO2, Arterial: 149 mmHg — ABNORMAL HIGH (ref 83.0–108.0)

## 2020-02-08 LAB — FERRITIN: Ferritin: 3332 ng/mL — ABNORMAL HIGH (ref 11–307)

## 2020-02-08 LAB — COMPREHENSIVE METABOLIC PANEL
ALT: 59 U/L — ABNORMAL HIGH (ref 0–44)
AST: 119 U/L — ABNORMAL HIGH (ref 15–41)
Albumin: 2.6 g/dL — ABNORMAL LOW (ref 3.5–5.0)
Alkaline Phosphatase: 83 U/L (ref 38–126)
Anion gap: 12 (ref 5–15)
BUN: 10 mg/dL (ref 6–20)
CO2: 22 mmol/L (ref 22–32)
Calcium: 7.8 mg/dL — ABNORMAL LOW (ref 8.9–10.3)
Chloride: 109 mmol/L (ref 98–111)
Creatinine, Ser: 0.88 mg/dL (ref 0.44–1.00)
GFR, Estimated: >60 mL/min (ref >60)
Glucose, Bld: 192 mg/dL — ABNORMAL HIGH (ref 70–99)
Potassium: 3.3 mmol/L — ABNORMAL LOW (ref 3.5–5.1)
Sodium: 143 mmol/L (ref 135–145)
Total Bilirubin: 0.7 mg/dL (ref 0.3–1.2)
Total Protein: 6 g/dL — ABNORMAL LOW (ref 6.5–8.1)

## 2020-02-08 LAB — GLUCOSE, CAPILLARY
Glucose-Capillary: 172 mg/dL — ABNORMAL HIGH (ref 70–99)
Glucose-Capillary: 177 mg/dL — ABNORMAL HIGH (ref 70–99)
Glucose-Capillary: 188 mg/dL — ABNORMAL HIGH (ref 70–99)
Glucose-Capillary: 195 mg/dL — ABNORMAL HIGH (ref 70–99)
Glucose-Capillary: 201 mg/dL — ABNORMAL HIGH (ref 70–99)
Glucose-Capillary: 207 mg/dL — ABNORMAL HIGH (ref 70–99)

## 2020-02-08 LAB — LEGIONELLA PNEUMOPHILA SEROGP 1 UR AG: L. pneumophila Serogp 1 Ur Ag: NEGATIVE

## 2020-02-08 LAB — HEMOGLOBIN A1C
Hgb A1c MFr Bld: 5.8 % — ABNORMAL HIGH (ref 4.8–5.6)
Mean Plasma Glucose: 119.76 mg/dL

## 2020-02-08 LAB — PHOSPHORUS
Phosphorus: 2.2 mg/dL — ABNORMAL LOW (ref 2.5–4.6)
Phosphorus: 2.5 mg/dL (ref 2.5–4.6)
Phosphorus: 4.2 mg/dL (ref 2.5–4.6)

## 2020-02-08 LAB — D-DIMER, QUANTITATIVE: D-Dimer, Quant: 1.22 ug/mL-FEU — ABNORMAL HIGH (ref 0.00–0.50)

## 2020-02-08 LAB — MAGNESIUM
Magnesium: 2.2 mg/dL (ref 1.7–2.4)
Magnesium: 2.2 mg/dL (ref 1.7–2.4)
Magnesium: 2.2 mg/dL (ref 1.7–2.4)

## 2020-02-08 MED ORDER — ADULT MULTIVITAMIN W/MINERALS CH
1.0000 | ORAL_TABLET | Freq: Every day | ORAL | Status: DC
  Administered 2020-02-09 – 2020-02-28 (×20): 1
  Filled 2020-02-08 (×21): qty 1

## 2020-02-08 MED ORDER — PHENYLEPHRINE 40 MCG/ML (10ML) SYRINGE FOR IV PUSH (FOR BLOOD PRESSURE SUPPORT)
PREFILLED_SYRINGE | INTRAVENOUS | Status: AC
  Administered 2020-02-08: 2 ug
  Filled 2020-02-08: qty 10

## 2020-02-08 MED ORDER — ASCORBIC ACID 500 MG PO TABS
500.0000 mg | ORAL_TABLET | Freq: Every day | ORAL | Status: DC
  Administered 2020-02-09 – 2020-02-28 (×20): 500 mg
  Filled 2020-02-08 (×21): qty 1

## 2020-02-08 MED ORDER — SODIUM CHLORIDE 0.9 % IV SOLN
0.5000 mg/h | INTRAVENOUS | Status: DC
  Administered 2020-02-08: 2 mg/h via INTRAVENOUS
  Filled 2020-02-08: qty 20

## 2020-02-08 MED ORDER — FREE WATER
200.0000 mL | Freq: Four times a day (QID) | Status: DC
  Administered 2020-02-08 – 2020-02-12 (×18): 200 mL

## 2020-02-08 MED ORDER — VECURONIUM BROMIDE 10 MG IV SOLR
INTRAVENOUS | Status: AC
  Filled 2020-02-08: qty 10

## 2020-02-08 MED ORDER — HYDROMORPHONE BOLUS VIA INFUSION
0.5000 mg | INTRAVENOUS | Status: DC | PRN
  Administered 2020-02-10 – 2020-02-13 (×20): 0.5 mg via INTRAVENOUS
  Filled 2020-02-08: qty 1

## 2020-02-08 MED ORDER — SODIUM CHLORIDE 0.9 % IV SOLN
1.0000 mg/h | INTRAVENOUS | Status: DC
  Filled 2020-02-08: qty 5

## 2020-02-08 MED ORDER — VITAL AF 1.2 CAL PO LIQD
1000.0000 mL | ORAL | Status: DC
  Administered 2020-02-08 – 2020-02-12 (×6): 1000 mL
  Filled 2020-02-08 (×6): qty 1000

## 2020-02-08 MED ORDER — FENTANYL CITRATE (PF) 100 MCG/2ML IJ SOLN
INTRAMUSCULAR | Status: AC
  Administered 2020-02-08: 200 ug via INTRAVENOUS
  Filled 2020-02-08: qty 4

## 2020-02-08 MED ORDER — LORAZEPAM 2 MG/ML IJ SOLN
6.0000 mg | Freq: Once | INTRAMUSCULAR | Status: AC
  Administered 2020-02-08: 6 mg via INTRAVENOUS

## 2020-02-08 MED ORDER — ROCURONIUM BROMIDE 10 MG/ML (PF) SYRINGE: 100.0000 mg | PREFILLED_SYRINGE | Freq: Once | INTRAVENOUS | Status: AC

## 2020-02-08 MED ORDER — PHENOBARBITAL SODIUM 130 MG/ML IJ SOLN
200.0000 mg | Freq: Once | INTRAMUSCULAR | Status: AC
  Administered 2020-02-08: 200 mg via INTRAVENOUS
  Filled 2020-02-08: qty 2

## 2020-02-08 MED ORDER — ZINC SULFATE 220 (50 ZN) MG PO CAPS
220.0000 mg | ORAL_CAPSULE | Freq: Every day | ORAL | Status: DC
Start: 2020-02-09 — End: 2020-02-29
  Administered 2020-02-09 – 2020-02-28 (×20): 220 mg
  Filled 2020-02-08 (×21): qty 1

## 2020-02-08 MED ORDER — LORAZEPAM 2 MG/ML IJ SOLN
INTRAMUSCULAR | Status: AC
  Administered 2020-02-08: 6 mg via INTRAVENOUS
  Filled 2020-02-08: qty 2

## 2020-02-08 MED ORDER — ETOMIDATE 2 MG/ML IV SOLN
INTRAVENOUS | Status: AC
  Administered 2020-02-08: 20 mg via INTRAVENOUS
  Filled 2020-02-08: qty 20

## 2020-02-08 MED ORDER — POTASSIUM PHOSPHATES 15 MMOLE/5ML IV SOLN
15.0000 mmol | Freq: Once | INTRAVENOUS | Status: AC
  Administered 2020-02-08: 15 mmol via INTRAVENOUS
  Filled 2020-02-08: qty 5

## 2020-02-08 MED ORDER — PREDNISONE 20 MG PO TABS
50.0000 mg | ORAL_TABLET | Freq: Every day | ORAL | Status: DC
  Administered 2020-02-10: 50 mg
  Filled 2020-02-08: qty 1

## 2020-02-08 MED ORDER — PHENOBARBITAL SODIUM 130 MG/ML IJ SOLN
200.0000 mg | Freq: Every day | INTRAMUSCULAR | Status: DC
  Administered 2020-02-08 – 2020-02-11 (×4): 200 mg via INTRAVENOUS
  Filled 2020-02-08 (×5): qty 2

## 2020-02-08 MED ORDER — HYDROMORPHONE BOLUS VIA INFUSION
0.5000 mg | INTRAVENOUS | Status: DC | PRN
  Filled 2020-02-08: qty 1

## 2020-02-08 MED ORDER — MIDAZOLAM HCL 2 MG/2ML IJ SOLN
1.0000 mg | INTRAMUSCULAR | Status: DC | PRN
Start: 2020-02-08 — End: 2020-02-08

## 2020-02-08 MED ORDER — HALOPERIDOL LACTATE 5 MG/ML IJ SOLN
5.0000 mg | Freq: Four times a day (QID) | INTRAMUSCULAR | Status: DC | PRN
  Filled 2020-02-08: qty 1

## 2020-02-08 MED ORDER — PANTOPRAZOLE SODIUM 40 MG PO PACK
40.0000 mg | PACK | Freq: Every day | ORAL | Status: DC
  Administered 2020-02-08 – 2020-02-27 (×20): 40 mg
  Filled 2020-02-08 (×22): qty 20

## 2020-02-08 MED ORDER — FENTANYL CITRATE (PF) 100 MCG/2ML IJ SOLN: 200.0000 ug | Freq: Once | INTRAMUSCULAR | Status: AC

## 2020-02-08 MED ORDER — MIDAZOLAM 50MG/50ML (1MG/ML) PREMIX INFUSION
2.0000 mg/h | INTRAVENOUS | Status: DC
  Administered 2020-02-08: 6 mg/h via INTRAVENOUS
  Administered 2020-02-08: 7 mg/h via INTRAVENOUS
  Administered 2020-02-09 – 2020-02-10 (×4): 6 mg/h via INTRAVENOUS
  Administered 2020-02-10: 8 mg/h via INTRAVENOUS
  Administered 2020-02-10 – 2020-02-11 (×2): 10 mg/h via INTRAVENOUS
  Administered 2020-02-11 (×2): 12 mg/h via INTRAVENOUS
  Administered 2020-02-11: 10 mg/h via INTRAVENOUS
  Administered 2020-02-11: 11 mg/h via INTRAVENOUS
  Administered 2020-02-11 – 2020-02-14 (×12): 12 mg/h via INTRAVENOUS
  Administered 2020-02-14: 11 mg/h via INTRAVENOUS
  Administered 2020-02-14: 10 mg/h via INTRAVENOUS
  Administered 2020-02-14 – 2020-02-15 (×9): 12 mg/h via INTRAVENOUS
  Administered 2020-02-16: 10 mg/h via INTRAVENOUS
  Administered 2020-02-16: 9 mg/h via INTRAVENOUS
  Administered 2020-02-16: 10 mg/h via INTRAVENOUS
  Administered 2020-02-16: 11 mg/h via INTRAVENOUS
  Administered 2020-02-16: 12 mg/h via INTRAVENOUS
  Administered 2020-02-17 (×4): 10 mg/h via INTRAVENOUS
  Administered 2020-02-17 – 2020-02-18 (×2): 12 mg/h via INTRAVENOUS
  Administered 2020-02-18: 10 mg/h via INTRAVENOUS
  Administered 2020-02-18 (×3): 12 mg/h via INTRAVENOUS
  Administered 2020-02-19: 10 mg/h via INTRAVENOUS
  Administered 2020-02-19: 8 mg/h via INTRAVENOUS
  Administered 2020-02-19 – 2020-02-20 (×3): 10 mg/h via INTRAVENOUS
  Administered 2020-02-20: 12 mg/h via INTRAVENOUS
  Administered 2020-02-20 (×2): 10 mg/h via INTRAVENOUS
  Administered 2020-02-20: 12 mg/h via INTRAVENOUS
  Administered 2020-02-20: 10 mg/h via INTRAVENOUS
  Administered 2020-02-21: 12 mg/h via INTRAVENOUS
  Filled 2020-02-08 (×62): qty 50

## 2020-02-08 MED ORDER — SODIUM CHLORIDE 0.9 % IV SOLN
1.0000 mg/h | INTRAVENOUS | Status: DC
  Administered 2020-02-09: 4 mg/h via INTRAVENOUS
  Administered 2020-02-11: 5 mg/h via INTRAVENOUS
  Administered 2020-02-12: 6 mg/h via INTRAVENOUS
  Filled 2020-02-08 (×7): qty 20

## 2020-02-08 MED ORDER — PROSOURCE TF PO LIQD
45.0000 mL | Freq: Two times a day (BID) | ORAL | Status: DC
  Administered 2020-02-08 – 2020-02-12 (×9): 45 mL
  Filled 2020-02-08 (×8): qty 45

## 2020-02-08 MED ORDER — INSULIN ASPART 100 UNIT/ML ~~LOC~~ SOLN
0.0000 [IU] | SUBCUTANEOUS | Status: DC
  Administered 2020-02-08: 4 [IU] via SUBCUTANEOUS
  Administered 2020-02-08: 7 [IU] via SUBCUTANEOUS
  Administered 2020-02-08: 4 [IU] via SUBCUTANEOUS
  Administered 2020-02-08: 7 [IU] via SUBCUTANEOUS
  Administered 2020-02-09 (×3): 11 [IU] via SUBCUTANEOUS
  Administered 2020-02-09: 7 [IU] via SUBCUTANEOUS
  Administered 2020-02-09: 4 [IU] via SUBCUTANEOUS
  Administered 2020-02-09 – 2020-02-10 (×3): 7 [IU] via SUBCUTANEOUS
  Administered 2020-02-10 (×2): 11 [IU] via SUBCUTANEOUS
  Administered 2020-02-10: 7 [IU] via SUBCUTANEOUS
  Administered 2020-02-11: 4 [IU] via SUBCUTANEOUS
  Administered 2020-02-11 (×2): 7 [IU] via SUBCUTANEOUS
  Administered 2020-02-11: 11 [IU] via SUBCUTANEOUS
  Administered 2020-02-11: 7 [IU] via SUBCUTANEOUS
  Administered 2020-02-12 (×2): 3 [IU] via SUBCUTANEOUS
  Administered 2020-02-12: 4 [IU] via SUBCUTANEOUS
  Administered 2020-02-12: 7 [IU] via SUBCUTANEOUS
  Administered 2020-02-13: 3 [IU] via SUBCUTANEOUS
  Administered 2020-02-13: 4 [IU] via SUBCUTANEOUS
  Administered 2020-02-13: 3 [IU] via SUBCUTANEOUS
  Administered 2020-02-13: 4 [IU] via SUBCUTANEOUS
  Administered 2020-02-14 (×2): 3 [IU] via SUBCUTANEOUS
  Administered 2020-02-14: 7 [IU] via SUBCUTANEOUS
  Administered 2020-02-15: 3 [IU] via SUBCUTANEOUS
  Administered 2020-02-15: 14 [IU] via SUBCUTANEOUS
  Administered 2020-02-15 – 2020-02-17 (×4): 3 [IU] via SUBCUTANEOUS
  Administered 2020-02-17 (×2): 7 [IU] via SUBCUTANEOUS
  Administered 2020-02-17: 3 [IU] via SUBCUTANEOUS
  Administered 2020-02-18: 7 [IU] via SUBCUTANEOUS
  Administered 2020-02-18 (×4): 4 [IU] via SUBCUTANEOUS
  Administered 2020-02-18 – 2020-02-19 (×2): 3 [IU] via SUBCUTANEOUS
  Administered 2020-02-19 (×2): 4 [IU] via SUBCUTANEOUS
  Administered 2020-02-20: 7 [IU] via SUBCUTANEOUS
  Administered 2020-02-20 – 2020-02-22 (×3): 3 [IU] via SUBCUTANEOUS
  Administered 2020-02-22: 4 [IU] via SUBCUTANEOUS
  Administered 2020-02-22: 3 [IU] via SUBCUTANEOUS
  Administered 2020-02-22: 4 [IU] via SUBCUTANEOUS
  Administered 2020-02-22 – 2020-02-23 (×7): 3 [IU] via SUBCUTANEOUS
  Administered 2020-02-24: 4 [IU] via SUBCUTANEOUS
  Administered 2020-02-24 (×4): 3 [IU] via SUBCUTANEOUS
  Administered 2020-02-24 – 2020-02-25 (×2): 4 [IU] via SUBCUTANEOUS
  Administered 2020-02-25: 3 [IU] via SUBCUTANEOUS
  Administered 2020-02-25: 4 [IU] via SUBCUTANEOUS
  Administered 2020-02-26 (×3): 3 [IU] via SUBCUTANEOUS
  Administered 2020-02-26: 4 [IU] via SUBCUTANEOUS
  Administered 2020-02-26: 3 [IU] via SUBCUTANEOUS
  Administered 2020-02-27: 4 [IU] via SUBCUTANEOUS
  Administered 2020-02-27 – 2020-02-29 (×5): 3 [IU] via SUBCUTANEOUS
  Administered 2020-03-01: 4 [IU] via SUBCUTANEOUS
  Administered 2020-03-01 – 2020-03-05 (×10): 3 [IU] via SUBCUTANEOUS

## 2020-02-08 MED ORDER — PROPOFOL 10 MG/ML IV BOLUS
INTRAVENOUS | Status: AC
  Administered 2020-02-08: 50 mg via INTRAVENOUS
  Filled 2020-02-08: qty 20

## 2020-02-08 MED ORDER — MIDAZOLAM BOLUS VIA INFUSION
1.0000 mg | INTRAVENOUS | Status: DC | PRN
  Administered 2020-02-10 – 2020-02-22 (×28): 2 mg via INTRAVENOUS
  Filled 2020-02-08: qty 2

## 2020-02-08 MED ORDER — ARTIFICIAL TEARS OPHTHALMIC OINT
1.0000 "application " | TOPICAL_OINTMENT | Freq: Three times a day (TID) | OPHTHALMIC | Status: DC
  Administered 2020-02-08: 1 via OPHTHALMIC

## 2020-02-08 MED ORDER — METHYLPREDNISOLONE SODIUM SUCC 125 MG IJ SOLR
0.5000 mg/kg | Freq: Two times a day (BID) | INTRAMUSCULAR | Status: AC
  Administered 2020-02-08 – 2020-02-09 (×3): 56.875 mg via INTRAVENOUS
  Filled 2020-02-08 (×3): qty 2

## 2020-02-08 MED ORDER — MIDAZOLAM 50MG/50ML (1MG/ML) PREMIX INFUSION
0.5000 mg/h | INTRAVENOUS | Status: DC
  Administered 2020-02-08: 2 mg/h via INTRAVENOUS
  Filled 2020-02-08 (×2): qty 50

## 2020-02-08 MED ORDER — INSULIN ASPART 100 UNIT/ML ~~LOC~~ SOLN
3.0000 [IU] | SUBCUTANEOUS | Status: DC
  Administered 2020-02-08 – 2020-02-09 (×5): 3 [IU] via SUBCUTANEOUS

## 2020-02-08 MED ORDER — VECURONIUM BROMIDE 10 MG IV SOLR
0.0000 ug/kg/min | Status: DC
  Administered 2020-02-08 – 2020-02-10 (×3): 1 ug/kg/min via INTRAVENOUS
  Filled 2020-02-08 (×5): qty 100

## 2020-02-08 MED ORDER — HYDROMORPHONE HCL 1 MG/ML IJ SOLN
1.0000 mg | Freq: Once | INTRAMUSCULAR | Status: AC
Start: 2020-02-08 — End: 2020-02-08
  Administered 2020-02-08: 1 mg via INTRAVENOUS
  Filled 2020-02-08: qty 1

## 2020-02-08 MED ORDER — BARICITINIB 1 MG PO TABS
4.0000 mg | ORAL_TABLET | Freq: Every day | ORAL | Status: AC
  Administered 2020-02-09 – 2020-02-20 (×12): 4 mg
  Filled 2020-02-08 (×12): qty 4

## 2020-02-08 MED ORDER — NOREPINEPHRINE 4 MG/250ML-% IV SOLN
0.0000 ug/min | INTRAVENOUS | Status: DC
  Administered 2020-02-08: 2 ug/min via INTRAVENOUS
  Filled 2020-02-08: qty 250

## 2020-02-08 MED ORDER — CHLORHEXIDINE GLUCONATE 0.12% ORAL RINSE (MEDLINE KIT)
15.0000 mL | Freq: Two times a day (BID) | OROMUCOSAL | Status: DC
  Administered 2020-02-08 – 2020-03-04 (×48): 15 mL via OROMUCOSAL

## 2020-02-08 MED ORDER — VECURONIUM BROMIDE 10 MG IV SOLR: 10.0000 mg | INTRAVENOUS | Status: DC | PRN

## 2020-02-08 MED ORDER — ORAL CARE MOUTH RINSE
15.0000 mL | OROMUCOSAL | Status: DC
  Administered 2020-02-08 – 2020-03-05 (×229): 15 mL via OROMUCOSAL

## 2020-02-08 MED ORDER — PANTOPRAZOLE SODIUM 40 MG PO TBEC
40.0000 mg | DELAYED_RELEASE_TABLET | Freq: Every day | ORAL | Status: DC
  Filled 2020-02-08: qty 1

## 2020-02-08 MED ORDER — LORAZEPAM 2 MG/ML IJ SOLN: 6.0000 mg | Freq: Once | INTRAMUSCULAR | Status: AC

## 2020-02-08 MED ORDER — ROCURONIUM BROMIDE 10 MG/ML (PF) SYRINGE
PREFILLED_SYRINGE | INTRAVENOUS | Status: AC
  Administered 2020-02-08: 100 mg via INTRAVENOUS
  Filled 2020-02-08: qty 10

## 2020-02-08 MED ORDER — ARTIFICIAL TEARS OPHTHALMIC OINT: 1.0000 "application " | TOPICAL_OINTMENT | Freq: Three times a day (TID) | OPHTHALMIC | Status: DC

## 2020-02-08 MED ORDER — ACETAMINOPHEN 325 MG PO TABS
650.0000 mg | ORAL_TABLET | Freq: Four times a day (QID) | ORAL | Status: DC | PRN
  Administered 2020-02-14 – 2020-02-28 (×12): 650 mg
  Filled 2020-02-08 (×12): qty 2

## 2020-02-08 MED ORDER — PROPOFOL 10 MG/ML IV BOLUS: 50.0000 mg | Freq: Once | INTRAVENOUS | Status: AC

## 2020-02-08 MED ORDER — VECURONIUM BOLUS VIA INFUSION
10.0000 mg | Freq: Once | INTRAVENOUS | Status: AC
  Administered 2020-02-08: 10 mg via INTRAVENOUS
  Filled 2020-02-08: qty 10

## 2020-02-08 MED ORDER — ETOMIDATE 2 MG/ML IV SOLN: 20.0000 mg | Freq: Once | INTRAVENOUS | Status: AC

## 2020-02-08 NOTE — Progress Notes (Signed)
Initial Nutrition Assessment  DOCUMENTATION CODES:   Morbid obesity  INTERVENTION:   Tube Feeding via OG: Vital AF 1.2 at 55 ml/hr Pro-Source TF 45 mL BID Provides 121 g of protein, 1664 kcals and 1069 mL of free water Meets 100% estimated calorie and protein needs  Add Free Water flush of 200 q 6 hours; provides 1869 mL per day   NUTRITION DIAGNOSIS:   Inadequate oral intake related to acute illness as evidenced by NPO status.  GOAL:   Patient will meet greater than or equal to 90% of their needs  MONITOR:   Vent status,TF tolerance,Weight trends,Labs  REASON FOR ASSESSMENT:   Consult,Ventilator Enteral/tube feeding initiation and management  ASSESSMENT:   39 yo female admitted with severe COVID-19 pneumonia with ARDS requiring intubation. No PMH other than childhood asthma, anxiety   Pt current sedated on vent support, on precedex and versed. Not requiring pressors, currently not requiring prone positioning.   OG tube with tip in stomach per xray  Current wt 113.4 kg; no previous wt encounters  Unable to obtain diet and weight history from patient at this time  Labs: reviewed Meds: ss novolog, soleumdrol followed by prednisone, MVI with Minerals, zinc sulfate, Vit C  Diet Order:   Diet Order            Diet NPO time specified  Diet effective now                 EDUCATION NEEDS:   Not appropriate for education at this time  Skin:  Skin Assessment: Reviewed RN Assessment  Last BM:  PTA  Height:   Ht Readings from Last 1 Encounters:  02/06/20 5' 3"  (1.6 m)    Weight:   Wt Readings from Last 1 Encounters:  02/06/20 113.4 kg    BMI:  Body mass index is 44.29 kg/m.  Estimated Nutritional Needs:   Kcal:  1400-1700 kcals  Protein:  115-130 g  Fluid:  >/= 2 L   Kerman Passey MS, RDN, LDN, CNSC Registered Dietitian III Clinical Nutrition RD Pager and On-Call Pager Number Located in [place]

## 2020-02-08 NOTE — Procedures (Signed)
Cortrak  Person Inserting Tube:  Lorin Gawron, RD Tube Type:  Cortrak - 43 inches Tube Location:  Left nare Initial Placement:  Stomach Secured by: Bridle Technique Used to Measure Tube Placement:  Documented cm marking at nare/ corner of mouth Cortrak Secured At:  75 cm   No x-ray is required. RN may begin using tube.   If the tube becomes dislodged please keep the tube and contact the Cortrak team at [email or web address] (password [email or web address]) for replacement.  If after hours and replacement cannot be delayed, place a NG tube and confirm placement with an abdominal x-ray.    Mariana Single RD, LDN Clinical Nutrition Pager listed in [place]

## 2020-02-08 NOTE — Procedures (Signed)
Intubation Procedure Note  Belinda [name]  [id number] 24-Dec-1980   Procedure: Intubation Indications: Respiratory insufficiency  Procedure Details Consent: Unable to obtain consent because of emergent medical necessity. Time Out: Verified patient identification, verified procedure, site/side was marked, verified correct patient position, special equipment/implants available, medications/allergies/relevent history reviewed, required imaging and test results available.  Performed  Pre-oxygenation: 100% via BVM Premedication: fentanyl 231mg Position: supine Induction agent: propofol 550m etomidate 2041maralytic: rocuronium 100m58mchnique: Videolaryngoscopy MAC 3 Tube size: 7.5mm 8myngoscopy view: 1 Number of attempts: 1 Insertion depth: 24cm Tube secured: tube holder Other findings:   OGT/NGT inserted: yes Position confirmed by auscultation: yes  Evaluation Colorimetric change: present Bilateral breath sounds: confirmed. Hemodynamic Status: BP stable throughout - given phenylephrine 100 mcg prior to induction.; O2 sats: transiently fell during during procedure Patient's Current Condition: stable Complications: No apparent complications Patient did tolerate procedure well. Chest X-ray ordered to verify placement.  CXR: pending.   Belinda [name] 01/04/2018

## 2020-02-08 NOTE — Progress Notes (Addendum)
eLink Physician-Brief Progress Note Patient Name: Belinda [name] DOB: 1980-04-28 MRN: [id number]   Date of Service  02/08/2020  HPI/Events of Note  Acute hypoxemic respiraory failure on HFNC and NRB, patient with delirium on Precedex.  eICU Interventions  Increase Precedex dose to try to get her calm and compliant with her masks, Patient in her current state is unlikely to tolerate BIPAP. Patient will be placed on Optiflow.        Kerry Kass Aleathea Pugmire 02/08/2020, 1:55 AM

## 2020-02-08 NOTE — Procedures (Signed)
Arterial Catheter Insertion Procedure Note  Belinda [name]  [id number]  12-17-1980  Date:02/08/20  Time:5:28 AM    Provider Performing: Kipp Brood    Procedure: Insertion of Arterial Line [phone]) without US guidance  Indication(s) Blood pressure monitoring and/or need for frequent ABGs  Consent Unable to obtain consent due to emergent nature of procedure.  Anesthesia None   Time Out Verified patient identification, verified procedure, site/side was marked, verified correct patient position, special equipment/implants available, medications/allergies/relevant history reviewed, required imaging and test results available.   Sterile Technique Maximal sterile technique including full sterile barrier drape, hand hygiene, sterile gown, sterile gloves, mask, hair covering, sterile ultrasound probe cover (if used).   Procedure Description Area of catheter insertion was cleaned with chlorhexidine and draped in sterile fashion. With real-time ultrasound guidance an arterial catheter was placed into the right radial artery.  Appropriate arterial tracings confirmed on monitor.     Complications/Tolerance None; patient tolerated the procedure well.   EBL Minimal   Kipp Brood, MD [facility] ICU Physician [place]  Pager: [phone] Or Epic Secure Chat After hours: [phone].  02/08/2020, 5:29 AM

## 2020-02-08 NOTE — Progress Notes (Signed)
Inpatient Diabetes Program Recommendations  AACE/ADA: New Consensus Statement on Inpatient Glycemic Control (2015)  Target Ranges:  Prepandial:   less than 140 mg/dL      Peak postprandial:   less than 180 mg/dL (1-2 hours)      Critically ill patients:  140 - 180 mg/dL   Results for Belinda [name], Belinda [name] (MRN [id number]) as of 02/08/2020 09:58  Ref. Range 02/07/2020 07:50 02/07/2020 12:02 02/07/2020 15:43 02/07/2020 20:07  Glucose-Capillary Latest Ref Range: 70 - 99 mg/dL 170 (H) 198 (H) 207 (H)  3 units NOVOLOG  258 (H)  5 units NOVOLOG    Results for Belinda [name], Belinda [name] (MRN [id number]) as of 02/08/2020 09:58  Ref. Range 02/07/2020 23:43 02/08/2020 03:20 02/08/2020 07:57  Glucose-Capillary Latest Ref Range: 70 - 99 mg/dL 268 (H)  5 units NOVOLOG  188 (H)  2 units NOVOLOG  195 (H)  2 units NOVOLOG     Admit with: Severe ARDS, COVID-19 pneumonia    Current Orders: Novolog Sensitive Correction Scale/ SSI (0-9 units) Q4 hours    Intubated early this AM  Getting Solumedrol 56.875 mg BID  Tube feeds to start today    MD- May consider adding Novolog tube feed coverage once tube feeds start  Novolog 3 units Q4 hours  HOLD if tube feeds HELD for any reason    --Will follow patient during hospitalization--  Wyn Quaker RN, MSN, CDE Diabetes Coordinator Inpatient Glycemic Control Team Team Pager: [phone] (8a-5p)

## 2020-02-08 NOTE — Progress Notes (Signed)
Pharmacy Electrolyte Replacement  Recent Labs:  Recent Labs    02/08/20 0300 02/08/20 0628  K 3.3* 3.6  MG 2.2  --   PHOS 2.2*  --   CREATININE 0.88  --     Low Critical Values (K </= 2.5, Phos </= 1, Mg </= 1) Present: None  MD Contacted: n/a - no critical values noted  Plan: Give Kphos 60mol IV x 1 Recheck phos in AM per protocol   HArturo Morton PharmD, BCPS Please check AMION for all [facility] numbers Clinical Pharmacist 02/08/2020 10:15 AM

## 2020-02-08 NOTE — Progress Notes (Addendum)
PCCM interval progress note:  Pt agitated and rapidly de-saturating to 70% overnight despite maximal HFNC and non-rebreather.  She did calm with benzodiazepines and anti-psychotics, however her oxygen sats did not recover so she was emergently intubated.   I spoke with both her grandmother and mother and updated them with patient's change in condition.    Otilio Carpen Gleason, PA-C  Critical care attending attestation note:  Patient seen and examined and relevant ancillary tests reviewed.  I agree with the assessment and plan of care as outlined by L Gleason. This patient was not seen as a shared visit. The following reflects my independent critical care time.   Synopsis of assessment and plan:  39 year old woman who is critically ill due to acute hypoxic respiratory failure due to COVID 19. Severe agitated delirium with patient yelling in loud voice and trying to climb out of bed.  Required 97m Ativan, 2013mPhenobarb and 1050maloperidol to get her reasonably calm.   Unfortunately, she remained severely hypoxic, so she was intubated and started on lung protective ventilation.  Will start sedation at high dose given degree of agitation.  Keep chemically paralyzed.  ABG to assess if needs prone ventilation.   CRITICAL CARE Performed by: RavKipp BroodTotal critical care time: 45 minutes  Critical care time was exclusive of separately billable procedures and treating other patients.  Critical care was necessary to treat or prevent imminent or life-threatening deterioration.  Critical care was time spent personally by me on the following activities: development of treatment plan with patient and/or surrogate as well as nursing, discussions with consultants, evaluation of patient's response to treatment, examination of patient, obtaining history from patient or surrogate, ordering and performing treatments and interventions, ordering and review of laboratory studies, ordering and review  of radiographic studies, pulse oximetry, re-evaluation of patient's condition and participation in multidisciplinary rounds.  RavKipp BroodD FRCUniverity Of Md Baltimore Washington Medical CenterU Physician [place]: [phone]: [phone] hours: [phone].  02/08/2020, 5:32 AM

## 2020-02-08 NOTE — Progress Notes (Signed)
NAME:  Belinda [name], MRN:  [id number], DOB:  1980-09-05, LOS: 2 ADMISSION DATE:  02/06/2020, CONSULTATION DATE:  12/16 REFERRING MD:  Josephine Cables ([facility]), CHIEF COMPLAINT:  Acute metabolic encephalopathy and respiratory failure in setting of COVID   History of Present Illness:  39 year old white female, only medical history is obesity.  Presented to [facility] ER 12/15 w/ ARDS and acute metabolic encephalopathy 2/2 COVID  Past Medical History:  Obesity, anxiety, childhood asthma  Significant Hospital Events:  12/15 presented to the emergency room, hypoxic, encephalopathic, Covid positive.CT brain negative for acute injury with only some mild para sinus mucosal thickening with some periodontal disease.  Started on supplemental oxygen, IV Solu-Medrol, IV remdesivir, and baricitinib.  Also started empirically on cefepime  and vancomycin to cover for potential bacterial pneumonia 12/16 multiple reports of worsening confusion, intermittent combativeness, attempts to bite staff.  Not able to take oral medications.  Started on Precedex.  Transferred to [facility]. Changed abx to unasyn, added low dose ativan as pt chronically on xanax.  12/17: Patient more hypoxic, requiring more sedation. Decision made to proceed with intubation.  Intubated, left IJ catheter placed.  PF ratio only 88.  Core track ordered.  Neuromuscular blockade initiated.  Prone protocol initiated. Consults:  Not applicable  Procedures:  Intubation 12/17 Left IJ triple-lumen catheter 12/17   Significant Diagnostic Tests:  CT brain 12/15: Negative for acute CVA or acute process  Micro Data:  RSA PCR 12/15 urine culture>> 80,000 colonies GNR Blood culture 12/15>>> Respiratory panel by RT-PCR 12/15: Positive for Covid   Antimicrobials:  Remdesivir 12/15 Cefepime 12/15-->12/16 Vancomycin 12/15-->12/16 unasyn 12/16>>>   Interim History / Subjective:  Now intubated  Objective   Blood pressure 103/65, pulse 68,  temperature (Abnormal) 100.5 F (38.1 C), temperature source Axillary, resp. rate (Abnormal) 22, height 5' 3"  (1.6 m), weight 113.4 kg, SpO2 95 %.    Vent Mode: PRVC FiO2 (%):  [100 %] 100 % Set Rate:  [20 bmp-28 bmp] 28 bmp Vt Set:  [320 mL] 320 mL PEEP:  [18 cmH20] 18 cmH20 Plateau Pressure:  [29 cmH20] 29 cmH20   Intake/Output Summary (Last 24 hours) at 02/08/2020 1031 Last data filed at 02/08/2020 0827 Gross per 24 hour  Intake 2723.28 ml  Output 1805 ml  Net 918.28 ml   Filed Weights   02/06/20 1853  Weight: 113.4 kg    Examination: General: 39 year old white female now sedated on mechanical ventilator HEENT normocephalic atraumatic orally intubated Pulmonary: Diminished bilaterally tachypneic.  Currently on 18 of PEEP/90%.  Pulse oximetry in mid 90s.  Current plateau pressure 29 Cardiac: Regular rate, no murmur rub or gallop Extremities: Warm and dry Abdomen: Soft nontender Neuro: Heavily sedated GU: Clear yellow.  Resolved Hospital Problem list     Assessment & Plan:  Acute hypoxic respiratory failure in setting of Covid pneumonia, and ARDS, plus minus CAP versus aspiration Now intubated. Portable chest x-ray with progressive bilateral airspace disease P/F ratio 88 Plan Day #3 of systemic steroids, will continue current dosing and initiate taper in a.m.  Day #3 of 5 remdesivir  Day #3 baricitinib  Full ventilator support, with protective low tidal volume ventilation  Continue to titrate PEEP/FiO2 goal PaO2 greater than 65, goal pulse oximetry greater than 85  Goal plateau pressures less than 30, goal driving pressures less than 15  PAD protocol RASS goal -4  Initiate neuromuscular blockade  Initiate proning protocol  Continue to trend inflammatory markers  A.m. chest x-ray  SIRS/sepsis secondary to Covid, complicated by urinary tract infection and possible aspiration pneumonia Plan KVO IV fluids CVP monitoring Antibiotic day #3 Continue Unasyn for  both possible aspiration and UTI, may need to adjust this based on sensitivities  Acute metabolic encephalopathy in the setting of sepsis and Covid infection.  Suspect exacerbated by hypoxia.  Chronically Xanax dependent plan PAD protocol Continue Dilaudid, as well as Versed RASS goal -3 to -4 while on ventilator  Fluid and electrolyte imbalance: Hypokalemia Hypophosphatemia Plan Replace recheck Continue to monitor daily  Mild elevated LFTs Plan Fairly stable on on remdesivir, but ED continue to monitor daily  Mild anemia Plan Trend cbc  Hyperglycemia Plan Adjusted ssi for hyperglycemia to resistant and added TF coverage   Best practice (evaluated daily)  Diet: ordering Cor trak 12/17 Pain/Anxiety/Delirium protocol (if indicated):dilaudid and versed 12/17 VAP protocol (if indicated): 12/17 DVT prophylaxis: Lovenox GI prophylaxis: PPI Glucose control: Sliding scale insulin (adjusted 12/17) Mobility: Bedrest currently requiring restraints Disposition: Intensive care  Goals of Care:  Last date of multidisciplinary goals of care discussion: via phone so not able to do multidisciplinary meeting  Family and staff present: NP and mother dianne  Summary of discussion: Full code  Follow up goals of care discussion due: Goals of care discussion due 12/23 Code Status: Full code   Critical care time: 32 minutes      Erick Colace ACNP-BC [place] Pager # [phone] OR # [phone] if no answer

## 2020-02-08 NOTE — Plan of Care (Signed)
  Problem: Nutrition: Goal: Adequate nutrition will be maintained Outcome: Progressing   

## 2020-02-08 NOTE — Progress Notes (Signed)
RT NOTES: Assisted nursing with proning patient. ETT secured at 24cm at the lips with cloth tape.

## 2020-02-08 NOTE — Progress Notes (Signed)
[place] Progress Note Patient Name: Belinda [name] DOB: 1980-07-26 MRN: [id number]   Date of Service  02/08/2020  HPI/Events of Note  Patient intubated and vented  But still a bit dyssynchronous on current Dilaudid / Versed infusion rates.  eICU Interventions  Bedside RN advised to titrate up sedatives as necessary to maintain adequate sedation, a PRN versed dose will also be added to her sedation regimen.        Kerry Kass Jaedah Lords 02/08/2020, 6:55 AM

## 2020-02-08 NOTE — Procedures (Signed)
Central Venous Catheter Insertion Procedure Note  Belinda [name]  [id number]  10/01/1980  Date:02/08/20  Time:5:16 AM   Provider Performing:Carmon Sahli R Tylor Courtwright   Procedure: Insertion of Non-tunneled Central Venous Catheter(36556) with US guidance (93716)   Indication(s) Medication administration  Consent Unable to obtain consent due to emergent nature of procedure.  Anesthesia Topical only with 1% lidocaine   Timeout Verified patient identification, verified procedure, site/side was marked, verified correct patient position, special equipment/implants available, medications/allergies/relevant history reviewed, required imaging and test results available.  Sterile Technique Maximal sterile technique including full sterile barrier drape, hand hygiene, sterile gown, sterile gloves, mask, hair covering, sterile ultrasound probe cover (if used).  Procedure Description Area of catheter insertion was cleaned with chlorhexidine and draped in sterile fashion.  With real-time ultrasound guidance a central venous catheter was placed into the left internal jugular vein. Nonpulsatile blood flow and easy flushing noted in all ports.  The catheter was sutured in place and sterile dressing applied.  Complications/Tolerance None; patient tolerated the procedure well. Chest X-ray is ordered to verify placement for internal jugular or subclavian cannulation.   Chest x-ray is not ordered for femoral cannulation.  EBL Minimal  Specimen(s) None  Belinda [name] Belinda Buis, PA-C

## 2020-02-08 NOTE — Progress Notes (Signed)
Mother updated via phone. She is aware over weekend she will be able to get updates from nursing staff but providers will only reach out if acute issues or changes that require that level of discussion  Erick Colace ACNP-BC [place] Pager # [phone] OR # [phone] if no answer

## 2020-02-09 DIAGNOSIS — U071 COVID-19: Secondary | ICD-10-CM | POA: Diagnosis not present

## 2020-02-09 DIAGNOSIS — J9602 Acute respiratory failure with hypercapnia: Secondary | ICD-10-CM

## 2020-02-09 DIAGNOSIS — R739 Hyperglycemia, unspecified: Secondary | ICD-10-CM

## 2020-02-09 DIAGNOSIS — J9601 Acute respiratory failure with hypoxia: Secondary | ICD-10-CM | POA: Diagnosis not present

## 2020-02-09 DIAGNOSIS — G9341 Metabolic encephalopathy: Secondary | ICD-10-CM | POA: Diagnosis not present

## 2020-02-09 DIAGNOSIS — E876 Hypokalemia: Secondary | ICD-10-CM | POA: Diagnosis not present

## 2020-02-09 DIAGNOSIS — J80 Acute respiratory distress syndrome: Secondary | ICD-10-CM | POA: Diagnosis not present

## 2020-02-09 DIAGNOSIS — J1282 Pneumonia due to coronavirus disease 2019: Secondary | ICD-10-CM | POA: Diagnosis not present

## 2020-02-09 DIAGNOSIS — E8809 Other disorders of plasma-protein metabolism, not elsewhere classified: Secondary | ICD-10-CM | POA: Diagnosis not present

## 2020-02-09 DIAGNOSIS — R748 Abnormal levels of other serum enzymes: Secondary | ICD-10-CM | POA: Diagnosis not present

## 2020-02-09 LAB — D-DIMER, QUANTITATIVE: D-Dimer, Quant: 1.43 ug/mL-FEU — ABNORMAL HIGH (ref 0.00–0.50)

## 2020-02-09 LAB — GLUCOSE, CAPILLARY
Glucose-Capillary: 198 mg/dL — ABNORMAL HIGH (ref 70–99)
Glucose-Capillary: 239 mg/dL — ABNORMAL HIGH (ref 70–99)
Glucose-Capillary: 245 mg/dL — ABNORMAL HIGH (ref 70–99)
Glucose-Capillary: 254 mg/dL — ABNORMAL HIGH (ref 70–99)
Glucose-Capillary: 264 mg/dL — ABNORMAL HIGH (ref 70–99)
Glucose-Capillary: 269 mg/dL — ABNORMAL HIGH (ref 70–99)

## 2020-02-09 LAB — URINE CULTURE: Culture: 80000 — AB

## 2020-02-09 LAB — COMPREHENSIVE METABOLIC PANEL
ALT: 102 U/L — ABNORMAL HIGH (ref 0–44)
AST: 155 U/L — ABNORMAL HIGH (ref 15–41)
Albumin: 2.5 g/dL — ABNORMAL LOW (ref 3.5–5.0)
Alkaline Phosphatase: 76 U/L (ref 38–126)
Anion gap: 11 (ref 5–15)
BUN: 15 mg/dL (ref 6–20)
CO2: 23 mmol/L (ref 22–32)
Calcium: 7.5 mg/dL — ABNORMAL LOW (ref 8.9–10.3)
Chloride: 109 mmol/L (ref 98–111)
Creatinine, Ser: 0.74 mg/dL (ref 0.44–1.00)
GFR, Estimated: >60 mL/min (ref >60)
Glucose, Bld: 239 mg/dL — ABNORMAL HIGH (ref 70–99)
Potassium: 3.8 mmol/L (ref 3.5–5.1)
Sodium: 143 mmol/L (ref 135–145)
Total Bilirubin: 0.9 mg/dL (ref 0.3–1.2)
Total Protein: 5.5 g/dL — ABNORMAL LOW (ref 6.5–8.1)

## 2020-02-09 LAB — POCT I-STAT 7, (LYTES, BLD GAS, ICA,H+H)
Acid-Base Excess: 2 mmol/L (ref 0.0–2.0)
Acid-Base Excess: 1 mmol/L (ref 0.0–2.0)
Acid-Base Excess: 1 mmol/L (ref 0.0–2.0)
Bicarbonate: 28.8 mmol/L — ABNORMAL HIGH (ref 20.0–28.0)
Bicarbonate: 27.2 mmol/L (ref 20.0–28.0)
Bicarbonate: 27.5 mmol/L (ref 20.0–28.0)
Calcium, Ion: 1.09 mmol/L — ABNORMAL LOW (ref 1.15–1.40)
Calcium, Ion: 1.13 mmol/L — ABNORMAL LOW (ref 1.15–1.40)
Calcium, Ion: 1.13 mmol/L — ABNORMAL LOW (ref 1.15–1.40)
HCT: 29 % — ABNORMAL LOW (ref 36.0–46.0)
HCT: 28 % — ABNORMAL LOW (ref 36.0–46.0)
HCT: 29 % — ABNORMAL LOW (ref 36.0–46.0)
Hemoglobin: 9.9 g/dL — ABNORMAL LOW (ref 12.0–15.0)
Hemoglobin: 9.5 g/dL — ABNORMAL LOW (ref 12.0–15.0)
Hemoglobin: 9.9 g/dL — ABNORMAL LOW (ref 12.0–15.0)
O2 Saturation: 97 %
O2 Saturation: 95 %
O2 Saturation: 96 %
Patient temperature: 97.3
Patient temperature: 97.2
Patient temperature: 36.5
Potassium: 3.8 mmol/L (ref 3.5–5.1)
Potassium: 3.3 mmol/L — ABNORMAL LOW (ref 3.5–5.1)
Potassium: 3.7 mmol/L (ref 3.5–5.1)
Sodium: 144 mmol/L (ref 135–145)
Sodium: 145 mmol/L (ref 135–145)
Sodium: 147 mmol/L — ABNORMAL HIGH (ref 135–145)
TCO2: 30 mmol/L (ref 22–32)
TCO2: 29 mmol/L (ref 22–32)
TCO2: 29 mmol/L (ref 22–32)
pCO2 arterial: 52.5 mmHg — ABNORMAL HIGH (ref 32.0–48.0)
pCO2 arterial: 50.7 mmHg — ABNORMAL HIGH (ref 32.0–48.0)
pCO2 arterial: 48.8 mmHg — ABNORMAL HIGH (ref 32.0–48.0)
pH, Arterial: 7.345 — ABNORMAL LOW (ref 7.350–7.450)
pH, Arterial: 7.333 — ABNORMAL LOW (ref 7.350–7.450)
pH, Arterial: 7.356 (ref 7.350–7.450)
pO2, Arterial: 98 mmHg (ref 83.0–108.0)
pO2, Arterial: 79 mmHg — ABNORMAL LOW (ref 83.0–108.0)
pO2, Arterial: 82 mmHg — ABNORMAL LOW (ref 83.0–108.0)

## 2020-02-09 LAB — CBC WITH DIFFERENTIAL/PLATELET
Abs Immature Granulocytes: 0.06 10*3/uL (ref 0.00–0.07)
Basophils Absolute: 0 10*3/uL (ref 0.0–0.1)
Basophils Relative: 0 %
Eosinophils Absolute: 0 10*3/uL (ref 0.0–0.5)
Eosinophils Relative: 0 %
HCT: 33.4 % — ABNORMAL LOW (ref 36.0–46.0)
Hemoglobin: 10.5 g/dL — ABNORMAL LOW (ref 12.0–15.0)
Immature Granulocytes: 1 %
Lymphocytes Relative: 18 %
Lymphs Abs: 1.3 10*3/uL (ref 0.7–4.0)
MCH: 29.5 pg (ref 26.0–34.0)
MCHC: 31.4 g/dL (ref 30.0–36.0)
MCV: 93.8 fL (ref 80.0–100.0)
Monocytes Absolute: 0.4 10*3/uL (ref 0.1–1.0)
Monocytes Relative: 6 %
Neutro Abs: 5.1 10*3/uL (ref 1.7–7.7)
Neutrophils Relative %: 75 %
Platelets: 203 10*3/uL (ref 150–400)
RBC: 3.56 MIL/uL — ABNORMAL LOW (ref 3.87–5.11)
RDW: 14.5 % (ref 11.5–15.5)
WBC: 6.8 10*3/uL (ref 4.0–10.5)
nRBC: 0.3 % — ABNORMAL HIGH (ref 0.0–0.2)

## 2020-02-09 LAB — PHOSPHORUS
Phosphorus: 2.7 mg/dL (ref 2.5–4.6)
Phosphorus: 1.5 mg/dL — ABNORMAL LOW (ref 2.5–4.6)

## 2020-02-09 LAB — MAGNESIUM
Magnesium: 2.6 mg/dL — ABNORMAL HIGH (ref 1.7–2.4)
Magnesium: 2.6 mg/dL — ABNORMAL HIGH (ref 1.7–2.4)

## 2020-02-09 LAB — C-REACTIVE PROTEIN: CRP: 4.5 mg/dL — ABNORMAL HIGH (ref <1.0)

## 2020-02-09 LAB — FERRITIN: Ferritin: 3080 ng/mL — ABNORMAL HIGH (ref 11–307)

## 2020-02-09 MED ORDER — INSULIN ASPART 100 UNIT/ML ~~LOC~~ SOLN
6.0000 [IU] | SUBCUTANEOUS | Status: DC
  Administered 2020-02-09 – 2020-02-10 (×7): 6 [IU] via SUBCUTANEOUS

## 2020-02-09 MED ORDER — POTASSIUM CHLORIDE 20 MEQ PO PACK
40.0000 meq | PACK | Freq: Once | ORAL | Status: AC
  Administered 2020-02-09: 40 meq
  Filled 2020-02-09: qty 2

## 2020-02-09 NOTE — Plan of Care (Signed)
  Problem: Clinical Measurements: Goal: Cardiovascular complication will be avoided Outcome: Progressing   

## 2020-02-09 NOTE — Progress Notes (Signed)
NAME:  VANESSIA BOKHARI, MRN:  [id number], DOB:  08-12-1980, LOS: 3 ADMISSION DATE:  02/06/2020, CONSULTATION DATE:  12/16 REFERRING MD:  Josephine Cables ([facility]), CHIEF COMPLAINT:  Acute metabolic encephalopathy and respiratory failure in setting of COVID   History of Present Illness:  39 year old white female, only medical history is obesity.  Presented to [facility] ER 12/15 w/ ARDS and acute metabolic encephalopathy 2/2 COVID  Past Medical History:  Obesity, anxiety, childhood asthma  Significant Hospital Events:  12/15 presented to the emergency room, hypoxic, encephalopathic, Covid positive.CT brain negative for acute injury with only some mild para sinus mucosal thickening with some periodontal disease.  Started on supplemental oxygen, IV Solu-Medrol, IV remdesivir, and baricitinib.  Also started empirically on cefepime  and vancomycin to cover for potential bacterial pneumonia 12/16 multiple reports of worsening confusion, intermittent combativeness, attempts to bite staff.  Not able to take oral medications.  Started on Precedex.  Transferred to [facility]. Changed abx to unasyn, added low dose ativan as pt chronically on xanax.  12/17: Patient more hypoxic, requiring more sedation. Decision made to proceed with intubation.  Intubated, left IJ catheter placed.  PF ratio only 88.  Core track ordered.  Neuromuscular blockade initiated.  Prone protocol initiated. Consults:  Not applicable  Procedures:  Intubation 12/17 Left IJ triple-lumen catheter 12/17   Significant Diagnostic Tests:  CT brain 12/15: Negative for acute CVA or acute process  Micro Data:  RSA PCR 12/15 urine culture>> 80,000 colonies GNR Blood culture 12/15>>> Respiratory panel by RT-PCR 12/15: Positive for Covid   Antimicrobials:  Remdesivir 12/15 Cefepime 12/15-->12/16 Vancomycin 12/15-->12/16 unasyn 12/16>>>   Interim History / Subjective:  Now intubated  Objective   Blood pressure (!) 109/59, pulse (!)  59, temperature (!) 97.52 F (36.4 C), resp. rate (!) 28, height 5' 3"  (1.6 m), weight 106.2 kg, SpO2 98 %.    Vent Mode: PRVC FiO2 (%):  [50 %-90 %] 50 % Set Rate:  [28 bmp] 28 bmp Vt Set:  [320 mL] 320 mL PEEP:  [18 cmH20] 18 cmH20 Plateau Pressure:  [27 cmH20-30 cmH20] 27 cmH20   Intake/Output Summary (Last 24 hours) at 02/09/2020 1152 Last data filed at 02/09/2020 1007 Gross per 24 hour  Intake 2104.35 ml  Output 1230 ml  Net 874.35 ml   Filed Weights   02/06/20 1853 02/09/20 0425  Weight: 113.4 kg 106.2 kg    Examination: General: 39 year old white female now sedated on mechanical ventilator HEENT normocephalic atraumatic orally intubated Pulmonary: Diminished bilaterally tachypneic.  Currently on 18 of PEEP/90%.  Pulse oximetry in mid 90s.  Current plateau pressure 29 Cardiac: Regular rate, no murmur rub or gallop Extremities: Warm and dry Abdomen: Soft nontender Neuro: Heavily sedated GU: Clear yellow.  Resolved Hospital Problem list     Assessment & Plan:  Acute hypoxic respiratory failure in setting of Covid pneumonia, and ARDS, plus minus CAP versus aspiration Now intubated. Portable chest x-ray with progressive bilateral airspace disease and air bronchograms Plan Day #4 of systemic steroids, will continue current dosing and initiate taper in a.m.  Day #4 of 5 remdesivir  Day #4 baricitinib  Full ventilator support, with protective low tidal volume ventilation  Continue to titrate PEEP/FiO2 goal PaO2 greater than 65, goal pulse oximetry greater than 85  Goal plateau pressures less than 30, goal driving pressures less than 15  PAD protocol RASS goal -4  Maintain neuromuscular blockade  P/F ratio 158. Will stop prone ventilation if repeat ABG consistent  with this.  Continue to trend inflammatory markers  A.m. chest x-ray   SIRS/sepsis secondary to Covid, complicated by urinary tract infection and possible aspiration pneumonia Plan KVO IV fluids CVP  monitoring Antibiotic day #4. Will complete 5 day course for aspiration.   Acute metabolic encephalopathy in the setting of sepsis and Covid infection.  Suspect exacerbated by hypoxia.  Chronically Xanax dependent plan PAD protocol Continue Dilaudid, as well as Versed RASS goal -4 while on ventilator  Fluid and electrolyte imbalance: Hypokalemia Hypophosphatemia Plan Replace recheck Continue to monitor daily  Mild elevated LFTs Plan Fairly stable on on remdesivir, but ED continue to monitor daily  Mild anemia Plan Trend cbc  Hyperglycemia Plan Resistant SSI Increase TF coverage  Hypokalemia - replace potassium   Best practice (evaluated daily)  Diet: tube feeds advancing to goal Pain/Anxiety/Delirium protocol (if indicated):dilaudid and versed 12/18 VAP protocol (if indicated): 12/17 DVT prophylaxis: Lovenox GI prophylaxis: PPI Glucose control: Sliding scale insulin  Mobility: Bedrest currently requiring restraints Disposition: Intensive care  Goals of Care:  Last date of multidisciplinary goals of care discussion: via phone so not able to do multidisciplinary meeting  Family and staff present: NP and mother dianne  Summary of discussion: Full code  Follow up goals of care discussion due: Goals of care discussion due 12/23 Code Status: Full code  Critical care time: 34 minutes    Lenice Llamas, MD Pulmonary and [employer] Pager: [place]

## 2020-02-09 NOTE — Progress Notes (Signed)
PT flipped back supine at 2081 with no complications.

## 2020-02-10 DIAGNOSIS — U071 COVID-19: Secondary | ICD-10-CM | POA: Diagnosis not present

## 2020-02-10 DIAGNOSIS — J9601 Acute respiratory failure with hypoxia: Secondary | ICD-10-CM | POA: Diagnosis not present

## 2020-02-10 DIAGNOSIS — R748 Abnormal levels of other serum enzymes: Secondary | ICD-10-CM | POA: Diagnosis not present

## 2020-02-10 DIAGNOSIS — G9341 Metabolic encephalopathy: Secondary | ICD-10-CM | POA: Diagnosis not present

## 2020-02-10 DIAGNOSIS — A4189 Other specified sepsis: Secondary | ICD-10-CM | POA: Diagnosis not present

## 2020-02-10 DIAGNOSIS — J1282 Pneumonia due to coronavirus disease 2019: Secondary | ICD-10-CM | POA: Diagnosis not present

## 2020-02-10 DIAGNOSIS — J80 Acute respiratory distress syndrome: Secondary | ICD-10-CM | POA: Diagnosis not present

## 2020-02-10 DIAGNOSIS — N390 Urinary tract infection, site not specified: Secondary | ICD-10-CM | POA: Diagnosis not present

## 2020-02-10 DIAGNOSIS — J690 Pneumonitis due to inhalation of food and vomit: Secondary | ICD-10-CM | POA: Diagnosis not present

## 2020-02-10 DIAGNOSIS — R739 Hyperglycemia, unspecified: Secondary | ICD-10-CM | POA: Diagnosis not present

## 2020-02-10 DIAGNOSIS — E876 Hypokalemia: Secondary | ICD-10-CM | POA: Diagnosis not present

## 2020-02-10 DIAGNOSIS — K760 Fatty (change of) liver, not elsewhere classified: Secondary | ICD-10-CM | POA: Diagnosis not present

## 2020-02-10 DIAGNOSIS — Z6841 Body Mass Index (BMI) 40.0 and over, adult: Secondary | ICD-10-CM | POA: Diagnosis not present

## 2020-02-10 DIAGNOSIS — Z9911 Dependence on respirator [ventilator] status: Secondary | ICD-10-CM | POA: Diagnosis not present

## 2020-02-10 DIAGNOSIS — J9602 Acute respiratory failure with hypercapnia: Secondary | ICD-10-CM | POA: Diagnosis not present

## 2020-02-10 DIAGNOSIS — E8809 Other disorders of plasma-protein metabolism, not elsewhere classified: Secondary | ICD-10-CM | POA: Diagnosis not present

## 2020-02-10 LAB — BLOOD GAS, ARTERIAL
Acid-Base Excess: 1.9 mmol/L (ref 0.0–2.0)
Bicarbonate: 26.5 mmol/L (ref 20.0–28.0)
Drawn by: 60057
FIO2: 50
O2 Saturation: 96.7 %
Patient temperature: 36.5
pCO2 arterial: 44.2 mmHg (ref 32.0–48.0)
pH, Arterial: 7.392 (ref 7.350–7.450)
pO2, Arterial: 89.6 mmHg (ref 83.0–108.0)

## 2020-02-10 LAB — COMPREHENSIVE METABOLIC PANEL
ALT: 92 U/L — ABNORMAL HIGH (ref 0–44)
AST: 75 U/L — ABNORMAL HIGH (ref 15–41)
Albumin: 2.2 g/dL — ABNORMAL LOW (ref 3.5–5.0)
Alkaline Phosphatase: 69 U/L (ref 38–126)
Anion gap: 11 (ref 5–15)
BUN: 20 mg/dL (ref 6–20)
CO2: 24 mmol/L (ref 22–32)
Calcium: 7.6 mg/dL — ABNORMAL LOW (ref 8.9–10.3)
Chloride: 110 mmol/L (ref 98–111)
Creatinine, Ser: 0.64 mg/dL (ref 0.44–1.00)
GFR, Estimated: >60 mL/min (ref >60)
Glucose, Bld: 252 mg/dL — ABNORMAL HIGH (ref 70–99)
Potassium: 3.5 mmol/L (ref 3.5–5.1)
Sodium: 145 mmol/L (ref 135–145)
Total Bilirubin: 0.4 mg/dL (ref 0.3–1.2)
Total Protein: 5.1 g/dL — ABNORMAL LOW (ref 6.5–8.1)

## 2020-02-10 LAB — CBC WITH DIFFERENTIAL/PLATELET
Abs Immature Granulocytes: 0 10*3/uL (ref 0.00–0.07)
Basophils Absolute: 0 10*3/uL (ref 0.0–0.1)
Basophils Relative: 0 %
Eosinophils Absolute: 0 10*3/uL (ref 0.0–0.5)
Eosinophils Relative: 0 %
HCT: 31.8 % — ABNORMAL LOW (ref 36.0–46.0)
Hemoglobin: 10.3 g/dL — ABNORMAL LOW (ref 12.0–15.0)
Lymphocytes Relative: 9 %
Lymphs Abs: 0.6 10*3/uL — ABNORMAL LOW (ref 0.7–4.0)
MCH: 30.2 pg (ref 26.0–34.0)
MCHC: 32.4 g/dL (ref 30.0–36.0)
MCV: 93.3 fL (ref 80.0–100.0)
Monocytes Absolute: 0.3 10*3/uL (ref 0.1–1.0)
Monocytes Relative: 4 %
Neutro Abs: 6.3 10*3/uL (ref 1.7–7.7)
Neutrophils Relative %: 87 %
Platelets: 210 10*3/uL (ref 150–400)
RBC: 3.41 MIL/uL — ABNORMAL LOW (ref 3.87–5.11)
RDW: 14.5 % (ref 11.5–15.5)
WBC: 7.2 10*3/uL (ref 4.0–10.5)
nRBC: 0.6 % — ABNORMAL HIGH (ref 0.0–0.2)
nRBC: 1 /100 WBC — ABNORMAL HIGH

## 2020-02-10 LAB — GLUCOSE, CAPILLARY
Glucose-Capillary: 203 mg/dL — ABNORMAL HIGH (ref 70–99)
Glucose-Capillary: 210 mg/dL — ABNORMAL HIGH (ref 70–99)
Glucose-Capillary: 220 mg/dL — ABNORMAL HIGH (ref 70–99)
Glucose-Capillary: 238 mg/dL — ABNORMAL HIGH (ref 70–99)
Glucose-Capillary: 258 mg/dL — ABNORMAL HIGH (ref 70–99)
Glucose-Capillary: 262 mg/dL — ABNORMAL HIGH (ref 70–99)

## 2020-02-10 LAB — FERRITIN: Ferritin: 1882 ng/mL — ABNORMAL HIGH (ref 11–307)

## 2020-02-10 LAB — C-REACTIVE PROTEIN: CRP: 2.4 mg/dL — ABNORMAL HIGH (ref <1.0)

## 2020-02-10 LAB — D-DIMER, QUANTITATIVE: D-Dimer, Quant: 2.36 ug/mL-FEU — ABNORMAL HIGH (ref 0.00–0.50)

## 2020-02-10 MED ORDER — CLONAZEPAM 1 MG PO TABS
1.0000 mg | ORAL_TABLET | Freq: Two times a day (BID) | ORAL | Status: DC
  Administered 2020-02-10 – 2020-02-16 (×13): 1 mg
  Filled 2020-02-10 (×13): qty 1

## 2020-02-10 MED ORDER — OXYCODONE HCL 5 MG/5ML PO SOLN
10.0000 mg | ORAL | Status: DC
Start: 2020-02-10 — End: 2020-02-16
  Administered 2020-02-10 – 2020-02-16 (×37): 10 mg
  Filled 2020-02-10 (×37): qty 10

## 2020-02-10 MED ORDER — CLONAZEPAM 1 MG PO TABS: 1.0000 mg | ORAL_TABLET | Freq: Two times a day (BID) | ORAL | Status: DC

## 2020-02-10 MED ORDER — INSULIN ASPART 100 UNIT/ML ~~LOC~~ SOLN
10.0000 [IU] | SUBCUTANEOUS | Status: DC
  Administered 2020-02-10 – 2020-02-20 (×40): 10 [IU] via SUBCUTANEOUS

## 2020-02-10 MED ORDER — OXYCODONE HCL 5 MG/5ML PO SOLN: 10.0000 mg | ORAL | Status: DC

## 2020-02-10 MED ORDER — INSULIN DETEMIR 100 UNIT/ML ~~LOC~~ SOLN
10.0000 [IU] | Freq: Two times a day (BID) | SUBCUTANEOUS | Status: DC
  Administered 2020-02-10 – 2020-03-01 (×39): 10 [IU] via SUBCUTANEOUS
  Filled 2020-02-10 (×44): qty 0.1

## 2020-02-10 NOTE — Progress Notes (Signed)
NAME:  Belinda [name], MRN:  [id number], DOB:  11-11-1980, LOS: 4 ADMISSION DATE:  02/06/2020, CONSULTATION DATE:  12/16 REFERRING MD:  Josephine Cables ([facility]), CHIEF COMPLAINT:  Acute metabolic encephalopathy and respiratory failure in setting of COVID   History of Present Illness:  39 year old white female, only medical history is obesity.  Presented to [facility] ER 12/15 w/ ARDS and acute metabolic encephalopathy 2/2 COVID  Past Medical History:  Obesity, anxiety, childhood asthma  Significant Hospital Events:  12/15 presented to the emergency room, hypoxic, encephalopathic, Covid positive.CT brain negative for acute injury with only some mild para sinus mucosal thickening with some periodontal disease.  Started on supplemental oxygen, IV Solu-Medrol, IV remdesivir, and baricitinib.  Also started empirically on cefepime  and vancomycin to cover for potential bacterial pneumonia 12/16 multiple reports of worsening confusion, intermittent combativeness, attempts to bite staff.  Not able to take oral medications.  Started on Precedex.  Transferred to [facility]. Changed abx to unasyn, added low dose ativan as pt chronically on xanax.  12/17: Patient more hypoxic, requiring more sedation. Decision made to proceed with intubation.  Intubated, left IJ catheter placed.  PF ratio only 88.  Core track ordered.  Neuromuscular blockade initiated.  Prone protocol initiated. Consults:  Not applicable  Procedures:  Intubation 12/17 Left IJ triple-lumen catheter 12/17   Significant Diagnostic Tests:  CT brain 12/15: Negative for acute CVA or acute process  Micro Data:  RSA PCR 12/15 urine culture>> 80,000 colonies GNR Blood culture 12/15>>> Respiratory panel by RT-PCR 12/15: Positive for Covid   Antimicrobials:  Remdesivir 12/15 Cefepime 12/15-->12/16 Vancomycin 12/15-->12/16 unasyn 12/16>>>   Interim History / Subjective:  No overnight events but this morning hypertensive and tachycardic.  EKG personally reviewed, sinus tachycardia with normal axis. BIS values are low in the 30s. She is still on continuous paralytics.   Objective   Blood pressure (!) 142/83, pulse (!) 136, temperature (!) 97.5 F (36.4 C), temperature source Axillary, resp. rate (!) 29, height 5' 3"  (1.6 m), weight 106.4 kg, SpO2 94 %.    Vent Mode: PRVC FiO2 (%):  [50 %] 50 % Set Rate:  [18 bmp-28 bmp] 28 bmp Vt Set:  [320 mL] 320 mL PEEP:  [12 cmH20-14 cmH20] 12 cmH20 Plateau Pressure:  [23 cmH20-27 cmH20] 23 cmH20   Intake/Output Summary (Last 24 hours) at 02/10/2020 1327 Last data filed at 02/10/2020 1216 Gross per 24 hour  Intake 2399.93 ml  Output 1980 ml  Net 419.93 ml   Filed Weights   02/06/20 1853 02/09/20 0425 02/10/20 0358  Weight: 113.4 kg 106.2 kg 106.4 kg    Examination: General: intubated, sedated HEENT ETT to vent Pulmonary: Diminished bilaterally tachypneic.  Currently on 12 of PEEP/50%.  Cardiac: Regular rate, no murmur rub or gallop Extremities: Warm and dry Abdomen: Soft nontender Neuro: Heavily sedated, not responsive GU: Clear yellow.  Resolved Hospital Problem list     Assessment & Plan:  Acute hypoxic respiratory failure in setting of Covid pneumonia, and ARDS, plus minus CAP versus aspiration Now intubated. Portable chest x-ray with progressive bilateral airspace disease and air bronchograms Plan Day #5 of systemic steroids, will continue current dosing and initiate taper in a.m.  Day #5 of 5 remdesivir  Day #5 baricitinib  Full ventilator support, with protective low tidal volume ventilation  Continue to titrate PEEP/FiO2 goal PaO2 greater than 65, goal pulse oximetry greater than 85  Goal plateau pressures less than 30, goal driving pressures less than 15  PAD protocol RASS goal -4  Continue to trend inflammatory markers  A.m. chest x-ray   Acute metabolic encephalopathy in the setting of sepsis and Covid infection.  Suspect exacerbated by hypoxia.   Chronically Xanax dependent plan PAD protocol Continue Dilaudid, as well as Versed RASS goal -4 while on ventilator Will d/c NMB as it has been over 48 hours. Adding scheduled oxycodone and klonopin to help with high sedation requirements. Will increase upper limits on dilaudid.   Sinus tachycardia with hypertension - she appears deeply sedated. Will increase medications as above. If not effective may consider prn beta blockers  SIRS/sepsis secondary to Covid, complicated by urinary tract infection and possible aspiration pneumonia Plan KVO IV fluids CVP monitoring Antibiotic day #5. Last day today  Fluid and electrolyte imbalance: Hypokalemia Hypophosphatemia Plan Replace recheck Continue to monitor daily  Mild elevated LFTs Plan Improving  Mild anemia Plan Trend cbc  Hyperglycemia Plan Resistant SSI Increase TF coverage and add long acting   Hypokalemia - replace potassium   Best practice (evaluated daily)  Diet: tube feeds advancing to goal Pain/Anxiety/Delirium protocol (if indicated):dilaudid and versed 12/18 VAP protocol (if indicated): 12/17 DVT prophylaxis: Lovenox GI prophylaxis: PPI Glucose control: Sliding scale insulin  Mobility: Bedrest currently requiring restraints Disposition: Intensive care  Goals of Care:  Last date of multidisciplinary goals of care discussion: via phone so not able to do multidisciplinary meeting  Family and staff present: NP and mother dianne  Summary of discussion: Full code  Follow up goals of care discussion due: Goals of care discussion due 12/23 Code Status: Full code  Critical care time: 34 minutes    The patient is critically ill with multiple organ systems failure and requires high complexity decision making for assessment and support, frequent evaluation and titration of therapies, application of advanced monitoring technologies and extensive interpretation of multiple databases.   Critical Care Time devoted to  patient care services described in this note is 34 minutes. This time reflects time of care of this [place] . This critical care time does not reflect separately billable procedures or procedure time, teaching time or supervisory time of PA/NP/Med student/Med Resident etc but could involve care discussion time.  Leone Haven Pulmonary and Critical Care Medicine 02/10/2020 1:27 PM  Pager: [phone] After hours pager: [phone]

## 2020-02-11 ENCOUNTER — Inpatient Hospital Stay (HOSPITAL_COMMUNITY): Payer: Medicaid Other

## 2020-02-11 DIAGNOSIS — J90 Pleural effusion, not elsewhere classified: Secondary | ICD-10-CM | POA: Diagnosis not present

## 2020-02-11 DIAGNOSIS — R0902 Hypoxemia: Secondary | ICD-10-CM | POA: Diagnosis not present

## 2020-02-11 DIAGNOSIS — J811 Chronic pulmonary edema: Secondary | ICD-10-CM | POA: Diagnosis not present

## 2020-02-11 DIAGNOSIS — U071 COVID-19: Secondary | ICD-10-CM | POA: Diagnosis not present

## 2020-02-11 DIAGNOSIS — I517 Cardiomegaly: Secondary | ICD-10-CM | POA: Diagnosis not present

## 2020-02-11 DIAGNOSIS — J189 Pneumonia, unspecified organism: Secondary | ICD-10-CM | POA: Diagnosis not present

## 2020-02-11 DIAGNOSIS — I10 Essential (primary) hypertension: Secondary | ICD-10-CM | POA: Diagnosis not present

## 2020-02-11 DIAGNOSIS — J80 Acute respiratory distress syndrome: Secondary | ICD-10-CM | POA: Diagnosis not present

## 2020-02-11 DIAGNOSIS — G9341 Metabolic encephalopathy: Secondary | ICD-10-CM

## 2020-02-11 LAB — BLOOD GAS, ARTERIAL
Acid-Base Excess: 4.7 mmol/L — ABNORMAL HIGH (ref 0.0–2.0)
Bicarbonate: 29.6 mmol/L — ABNORMAL HIGH (ref 20.0–28.0)
Drawn by: 60057
FIO2: 50
O2 Saturation: 97.8 %
Patient temperature: 37.2
pCO2 arterial: 51.6 mmHg — ABNORMAL HIGH (ref 32.0–48.0)
pH, Arterial: 7.376 (ref 7.350–7.450)
pO2, Arterial: 119 mmHg — ABNORMAL HIGH (ref 83.0–108.0)

## 2020-02-11 LAB — CULTURE, BLOOD (ROUTINE X 2)
Culture: NO GROWTH
Culture: NO GROWTH
Special Requests: ADEQUATE
Special Requests: ADEQUATE

## 2020-02-11 LAB — CBC WITH DIFFERENTIAL/PLATELET
Abs Immature Granulocytes: 0.29 10*3/uL — ABNORMAL HIGH (ref 0.00–0.07)
Basophils Absolute: 0 10*3/uL (ref 0.0–0.1)
Basophils Relative: 0 %
Eosinophils Absolute: 0 10*3/uL (ref 0.0–0.5)
Eosinophils Relative: 0 %
HCT: 31.9 % — ABNORMAL LOW (ref 36.0–46.0)
Hemoglobin: 10.2 g/dL — ABNORMAL LOW (ref 12.0–15.0)
Immature Granulocytes: 3 %
Lymphocytes Relative: 15 %
Lymphs Abs: 1.8 10*3/uL (ref 0.7–4.0)
MCH: 29.9 pg (ref 26.0–34.0)
MCHC: 32 g/dL (ref 30.0–36.0)
MCV: 93.5 fL (ref 80.0–100.0)
Monocytes Absolute: 0.8 10*3/uL (ref 0.1–1.0)
Monocytes Relative: 7 %
Neutro Abs: 8.8 10*3/uL — ABNORMAL HIGH (ref 1.7–7.7)
Neutrophils Relative %: 75 %
Platelets: 252 10*3/uL (ref 150–400)
RBC: 3.41 MIL/uL — ABNORMAL LOW (ref 3.87–5.11)
RDW: 14.5 % (ref 11.5–15.5)
WBC: 11.7 10*3/uL — ABNORMAL HIGH (ref 4.0–10.5)
nRBC: 0.6 % — ABNORMAL HIGH (ref 0.0–0.2)

## 2020-02-11 LAB — GLUCOSE, CAPILLARY
Glucose-Capillary: 152 mg/dL — ABNORMAL HIGH (ref 70–99)
Glucose-Capillary: 74 mg/dL (ref 70–99)
Glucose-Capillary: 103 mg/dL — ABNORMAL HIGH (ref 70–99)
Glucose-Capillary: 229 mg/dL — ABNORMAL HIGH (ref 70–99)
Glucose-Capillary: 258 mg/dL — ABNORMAL HIGH (ref 70–99)
Glucose-Capillary: 185 mg/dL — ABNORMAL HIGH (ref 70–99)

## 2020-02-11 LAB — COMPREHENSIVE METABOLIC PANEL
ALT: 81 U/L — ABNORMAL HIGH (ref 0–44)
AST: 49 U/L — ABNORMAL HIGH (ref 15–41)
Albumin: 2.3 g/dL — ABNORMAL LOW (ref 3.5–5.0)
Alkaline Phosphatase: 51 U/L (ref 38–126)
Anion gap: 8 (ref 5–15)
BUN: 20 mg/dL (ref 6–20)
CO2: 28 mmol/L (ref 22–32)
Calcium: 7.6 mg/dL — ABNORMAL LOW (ref 8.9–10.3)
Chloride: 112 mmol/L — ABNORMAL HIGH (ref 98–111)
Creatinine, Ser: 0.65 mg/dL (ref 0.44–1.00)
GFR, Estimated: >60 mL/min (ref >60)
Glucose, Bld: 129 mg/dL — ABNORMAL HIGH (ref 70–99)
Potassium: 3 mmol/L — ABNORMAL LOW (ref 3.5–5.1)
Sodium: 148 mmol/L — ABNORMAL HIGH (ref 135–145)
Total Bilirubin: 0.7 mg/dL (ref 0.3–1.2)
Total Protein: 5.1 g/dL — ABNORMAL LOW (ref 6.5–8.1)

## 2020-02-11 LAB — C-REACTIVE PROTEIN: CRP: 1.7 mg/dL — ABNORMAL HIGH (ref <1.0)

## 2020-02-11 LAB — FERRITIN: Ferritin: 1371 ng/mL — ABNORMAL HIGH (ref 11–307)

## 2020-02-11 LAB — D-DIMER, QUANTITATIVE: D-Dimer, Quant: 2.6 ug/mL-FEU — ABNORMAL HIGH (ref 0.00–0.50)

## 2020-02-11 MED ORDER — LACTATED RINGERS IV BOLUS
500.0000 mL | Freq: Once | INTRAVENOUS | Status: AC
  Administered 2020-02-11: 500 mL via INTRAVENOUS

## 2020-02-11 MED ORDER — HALOPERIDOL LACTATE 5 MG/ML IJ SOLN
5.0000 mg | INTRAMUSCULAR | Status: AC
  Administered 2020-02-11: 5 mg via INTRAVENOUS
  Filled 2020-02-11: qty 1

## 2020-02-11 MED ORDER — HALOPERIDOL LACTATE 5 MG/ML IJ SOLN
5.0000 mg | Freq: Four times a day (QID) | INTRAMUSCULAR | Status: DC
  Administered 2020-02-11 – 2020-02-22 (×44): 5 mg via INTRAVENOUS
  Filled 2020-02-11 (×44): qty 1

## 2020-02-11 MED ORDER — NOREPINEPHRINE 4 MG/250ML-% IV SOLN
0.0000 ug/min | INTRAVENOUS | Status: DC
  Administered 2020-02-11: 2 ug/min via INTRAVENOUS

## 2020-02-11 MED ORDER — PREDNISONE 20 MG PO TABS
40.0000 mg | ORAL_TABLET | Freq: Every day | ORAL | Status: DC
  Administered 2020-02-11 – 2020-02-14 (×4): 40 mg
  Filled 2020-02-11 (×4): qty 2

## 2020-02-11 MED ORDER — POTASSIUM CHLORIDE 20 MEQ PO PACK
20.0000 meq | PACK | ORAL | Status: AC
  Administered 2020-02-11 (×2): 20 meq
  Filled 2020-02-11 (×2): qty 1

## 2020-02-11 MED ORDER — DEXMEDETOMIDINE HCL IN NACL 400 MCG/100ML IV SOLN
0.4000 ug/kg/h | INTRAVENOUS | Status: DC
  Administered 2020-02-11: 0.2 ug/kg/h via INTRAVENOUS
  Administered 2020-02-11 – 2020-02-12 (×2): 0.4 ug/kg/h via INTRAVENOUS
  Administered 2020-02-12: 1 ug/kg/h via INTRAVENOUS
  Administered 2020-02-12: 1.1 ug/kg/h via INTRAVENOUS
  Administered 2020-02-12: 0.8 ug/kg/h via INTRAVENOUS
  Administered 2020-02-13: 0.4 ug/kg/h via INTRAVENOUS
  Administered 2020-02-13 (×4): 1.2 ug/kg/h via INTRAVENOUS
  Administered 2020-02-14: 0.8 ug/kg/h via INTRAVENOUS
  Administered 2020-02-14 (×3): 0.6 ug/kg/h via INTRAVENOUS
  Administered 2020-02-15 (×2): 1.2 ug/kg/h via INTRAVENOUS
  Administered 2020-02-15: 0.8 ug/kg/h via INTRAVENOUS
  Administered 2020-02-15: 0.9 ug/kg/h via INTRAVENOUS
  Administered 2020-02-15: 1.2 ug/kg/h via INTRAVENOUS
  Administered 2020-02-15: 0.7 ug/kg/h via INTRAVENOUS
  Administered 2020-02-15: 0.8 ug/kg/h via INTRAVENOUS
  Administered 2020-02-16: 1.2 ug/kg/h via INTRAVENOUS
  Administered 2020-02-16: 0.9 ug/kg/h via INTRAVENOUS
  Administered 2020-02-16: 1 ug/kg/h via INTRAVENOUS
  Administered 2020-02-16 (×2): 0.9 ug/kg/h via INTRAVENOUS
  Administered 2020-02-16: 1.2 ug/kg/h via INTRAVENOUS
  Administered 2020-02-17: 1 ug/kg/h via INTRAVENOUS
  Administered 2020-02-17: 1.2 ug/kg/h via INTRAVENOUS
  Administered 2020-02-17 (×2): 1 ug/kg/h via INTRAVENOUS
  Filled 2020-02-11 (×10): qty 100
  Filled 2020-02-11: qty 200
  Filled 2020-02-11 (×20): qty 100

## 2020-02-11 MED ORDER — POTASSIUM CHLORIDE 10 MEQ/50ML IV SOLN
10.0000 meq | INTRAVENOUS | Status: AC
  Administered 2020-02-11 (×4): 10 meq via INTRAVENOUS
  Filled 2020-02-11 (×5): qty 50

## 2020-02-11 MED ORDER — PHENOBARBITAL SODIUM 130 MG/ML IJ SOLN
200.0000 mg | Freq: Once | INTRAMUSCULAR | Status: AC
  Administered 2020-02-11: 200 mg via INTRAVENOUS

## 2020-02-11 MED ORDER — POTASSIUM CHLORIDE 20 MEQ PO PACK: 40.0000 meq | PACK | Freq: Every day | ORAL | Status: DC

## 2020-02-11 NOTE — Progress Notes (Signed)
[place] Progress Note Patient Name: Belinda [name] DOB: [birth year] MRN: [id number]   Date of Service  02/11/2020  HPI/Events of Note  Agitation - Patient is currently on ceiling doses of Dilaudid and Versed IV infusions.   eICU Interventions  Plan: 1. Phenobarbital 200 mg IV X 1 now. Extra dose.      Intervention Category Major Interventions: Delirium, psychosis, severe agitation - evaluation and management  Tylah Mancillas [place] 02/11/2020, 5:27 AM

## 2020-02-11 NOTE — Progress Notes (Signed)
NAME:  Belinda [name], MRN:  [id number], DOB:  [birth year], LOS: 5 ADMISSION DATE:  02/06/2020, CONSULTATION DATE:  12/16 REFERRING MD:  Josephine Cables ([facility]), CHIEF COMPLAINT:  Acute metabolic encephalopathy and respiratory failure in setting of COVID   BRIEF  39 year old white female, only medical history is obesity.  Presented to [facility] ER 12/15 w/ ARDS and acute metabolic encephalopathy 2/2 COVID  Past Medical History:  Obesity, anxiety, childhood asthma  Significant Hospital Events:  12/15 presented to the emergency room, hypoxic, encephalopathic, Covid positive.CT brain negative for acute injury with only some mild para sinus mucosal thickening with some periodontal disease.  Started on supplemental oxygen, IV Solu-Medrol, IV remdesivir, and baricitinib.  Also started empirically on cefepime  and vancomycin to cover for potential bacterial pneumonia 12/16 multiple reports of worsening confusion, intermittent combativeness, attempts to bite staff.  Not able to take oral medications.  Started on Precedex.  Transferred to [facility]. Changed abx to unasyn, added low dose ativan as pt chronically on xanax.  12/17: Patient more hypoxic, requiring more sedation. Decision made to proceed with intubation.  Intubated, left IJ catheter placed.  PF ratio only 88.  Core track ordered.  Neuromuscular blockade initiated.  Prone protocol initiated.  12/19 - No overnight events but this morning hypertensive and tachycardic. EKG personally reviewed, sinus tachycardia with normal axis. BIS values are low in the 30s. She is still on continuous paralytics.    Consults:  Not applicable  Procedures:  Intubation 12/17 Left IJ triple-lumen catheter 12/17   Significant Diagnostic Tests:  CT brain 12/15: Negative for acute CVA or acute process  Micro Data:  RSA PCR 12/15 urine culture>> 80,000 colonies GNR ->Klebsiella (resistant to ampi but sensitive to unasyn) Blood culture 12/15>>> Respiratory panel  by RT-PCR 12/15: Positive for Covid   Antimicrobials:  Remdesivir 12/15 Cefepime 12/15-->12/16 Vancomycin 12/15-->12/16 unasyn 12/16 (PCT 25, klbe uti) >>>12/20 xxx covid Rx  - barcitinib - prednisone  Interim History / Subjective:   12/20 -neuromuscular blockade discontinued yesterday.  At this point in time patient is extremely agitated on the ventilator despite Dilaudid infusion Versed infusion, oxycodone schedule, clonopoin scheduloed,  and phenobarb once daily at night.  She is on prednisone and Barcitinib  On vent 50%. AFebrikle  Objective   Blood pressure 97/64, pulse (!) 152, temperature 99 F (37.2 C), temperature source Oral, resp. rate (!) 23, height 5' 3"  (1.6 m), weight 108.2 kg, SpO2 93 %.    Vent Mode: PRVC FiO2 (%):  [50 %] 50 % Set Rate:  [28 bmp] 28 bmp Vt Set:  [320 mL] 320 mL PEEP:  [12 cmH20] 12 cmH20 Plateau Pressure:  [23 cmH20] 23 cmH20   Intake/Output Summary (Last 24 hours) at 02/11/2020 0753 Last data filed at 02/11/2020 0730 Gross per 24 hour  Intake 2388.16 ml  Output 1135 ml  Net 1253.16 ml   Filed Weights   02/09/20 0425 02/10/20 0358 02/11/20 0500  Weight: 106.2 kg 106.4 kg 108.2 kg    General Appearance:  Looks criticall ill OBESE - + Head:  Normocephalic, without obvious abnormality, atraumatic Eyes:  PERRL - ye, conjunctiva/corneas - muddy     Ears:  Normal external ear canals, both ears Nose:  G tube - no Throat:  ETT TUBE - yes , OG tube - yes Neck:  Supple,  No enlargement/tenderness/nodules Lungs: Clear to auscultation bilaterally, Ventilator   Synchrony - yesm, 50% Heart:  S1 and S2 normal, no murmur, CVP - no.  Pressors -  no Abdomen:  Soft, no masses, no organomegaly Genitalia / Rectal:  Not done Extremities:  Extremities- intact Skin:  ntact in exposed areas . Sacral area - not examined Neurologic:  Sedation - dilaudid gt, versed gtt, oxycodone, klonopn, phenobarb -> RASS - -2 to +2 ,  . Moves all 4s - yes. CAM-ICU -  positive . Orientation - not oreinted       Resolved Hospital Problem list     Assessment & Plan:  Acute hypoxic respiratory failure in setting of Covid pneumonia, and ARDS, plus minus CAP versus aspiration - off nimbex 12/19   02/11/2020 - > does not meet criteria for SBT/Extubation in setting of Acute Respiratory Failure due to ARDS 50% and acute agitated encphalopathy   Plan Full Vent spport No SBT VAP bundle   COVID-19 s/p remedsivir   day 6 steropids - reduce to 56m per day Day 5 barcitinb Track d-dimer, crp   Klebsiella urinary tract infection with sepsis syndrome with or without aspiration pneumonia  -02/11/2020: Afebrile  Plan -Continue Unasyn with last dose 133/82/5053 Acute metabolic encephalopathy in the setting of sepsis and Covid infection.  Suspect exacerbated by hypoxia.  Chronically Xanax dependent plan  02/11/2020 -significant intermittent agitation despite Dilaudid infusion, oxycodone schedule, Versed infusion and Klonopin schedule  Plan -  PAD protocol Continue Dilaudid, as well as Versed Continue oxycodone and Klonopin Continue phenobarb at night Add Precedex infusion As scheduled Haldol with QTC monitoring check QTC before giving each time.  Give only if QTC less than 5 ms RASS goal 0 to -2    Fluid and electrolyte imbalance: Hypokalemia   Plan Replace recheck Continue to monitor daily  Mild elevated LFTs - transamints  02/11/2020 - improved  Plan monitor  Anemia of critical illness  02/11/2020 - no bleeding   Plan .marne   Hyperglycemia Plan Resistant SSI Increase TF coverage and add long acting     Best practice (evaluated daily)  Diet: tube feeds advancing to goal Pain/Anxiety/Delirium protocol (if indicated):see above  VAP protocol (if indicated): 12/17 DVT prophylaxis: Lovenox GI prophylaxis: PPI Glucose control: Sliding scale insulin  Mobility: Bedrest currently requiring restraints Disposition:  Intensive care Family: RN instructed MD to update Donnie Hollinghead [phone] -> updated 02/11/20  Goals of Care:  Last date of multidisciplinary goals of care discussion: via phone so not able to do multidisciplinary meeting  Family and staff present: NP and mother dianne  Summary of discussion: Full code  Follow up goals of care discussion due: Goals of care discussion due 12/23     Code Status: Full code      ADodson  The patient KMELLISSA CONLEYis critically ill with multiple organ systems failure and requires high complexity decision making for assessment and support, frequent evaluation and titration of therapies, application of advanced monitoring technologies and extensive interpretation of multiple databases.   Critical Care Time devoted to patient care services described in this note is 45  Minutes. This time reflects time of care of this signee Dr MBrand Males This critical care time does not reflect procedure time, or teaching time or supervisory time of PA/NP/Med student/Med Resident etc but could involve care discussion time     Dr. MBrand Males M.D., FPeterson Regional Medical CenterC.P Pulmonary and Critical Care Medicine Staff Physician CEllinwoodPulmonary and Critical Care Pager: [phone] If no answer or between  15:00h - 7:00h: call [phone]  02/11/2020 8:05 AM  LABS    PULMONARY Recent Labs  Lab 02/08/20 0628 02/08/20 1540 02/09/20 0454 02/09/20 0806 02/09/20 1414 02/10/20 0435 02/11/20 0456  PHART 7.295* 7.301* 7.345* 7.333* 7.356 7.392 7.376  PCO2ART 54.8* 58.4* 52.5* 50.7* 48.8* 44.2 51.6*  PO2ART 88 149* 98 79* 82* 89.6 119*  HCO3 26.2 28.5* 28.8* 27.2 27.5 26.5 29.6*  TCO2 28 30 30 29 29   --   --   O2SAT 94.0 99.0 97.0 95.0 96.0 96.7 97.8    CBC Recent Labs  Lab 02/09/20 0423 02/09/20 0454 02/09/20 1414 02/10/20 0350 02/11/20 0455  HGB 10.5*   < > 9.9* 10.3* 10.2*  HCT 33.4*   < > 29.0* 31.8*  31.9*  WBC 6.8  --   --  7.2 11.7*  PLT 203  --   --  210 252   < > = values in this interval not displayed.    COAGULATION Recent Labs  Lab 02/06/20 1834  INR 1.1    CARDIAC  No results for input(s): TROPONINI in the last 168 hours. No results for input(s): PROBNP in the last 168 hours.   CHEMISTRY Recent Labs  Lab 02/07/20 2118 02/08/20 0300 02/08/20 0628 02/08/20 1030 02/08/20 1540 02/08/20 1654 02/09/20 0423 02/09/20 0454 02/09/20 0806 02/09/20 1414 02/09/20 2230 02/10/20 0350 02/11/20 0455  NA 139 143   < >  --    < >  --  143 144 145 147*  --  145 148*  K 3.6 3.3*   < >  --    < >  --  3.8 3.8 3.3* 3.7  --  3.5 3.0*  CL 107 109  --   --   --   --  109  --   --   --   --  110 112*  CO2 21* 22  --   --   --   --  23  --   --   --   --  24 28  GLUCOSE 256* 192*  --   --   --   --  239*  --   --   --   --  252* 129*  BUN 11 10  --   --   --   --  15  --   --   --   --  20 20  CREATININE 0.80 0.88  --   --   --   --  0.74  --   --   --   --  0.64 0.65  CALCIUM 7.7* 7.8*  --   --   --   --  7.5*  --   --   --   --  7.6* 7.6*  MG  --  2.2  --  2.2  --  2.2 2.6*  --   --   --  2.6*  --   --   PHOS  --  2.2*  --  2.5  --  4.2 2.7  --   --   --  1.5*  --   --    < > = values in this interval not displayed.   Estimated Creatinine Clearance: 112.4 mL/min (by C-G formula based on SCr of 0.65 mg/dL).   LIVER Recent Labs  Lab 02/06/20 1834 02/07/20 0513 02/08/20 0300 02/09/20 0423 02/10/20 0350 02/11/20 0455  AST 111* 101* 119* 155* 75* 49*  ALT 56* 50* 59* 102* 92* 81*  ALKPHOS 108 89 83 76 69 51  BILITOT 0.8 0.7  0.7 0.9 0.4 0.7  PROT 7.2 6.4* 6.0* 5.5* 5.1* 5.1*  ALBUMIN 3.2* 2.9* 2.6* 2.5* 2.2* 2.3*  INR 1.1  --   --   --   --   --      INFECTIOUS Recent Labs  Lab 02/06/20 1834 02/06/20 2019  LATICACIDVEN 1.5 1.2  PROCALCITON  --  28.52     ENDOCRINE CBG (last 3)  Recent Labs    02/10/20 2344 02/11/20 0345 02/11/20 0745  GLUCAP 220* 152*  74         IMAGING x48h  - image(s) personally visualized  -   highlighted in bold No results found.

## 2020-02-11 NOTE — Progress Notes (Signed)
K 3.0 Electrolytes replaced per protocol

## 2020-02-11 NOTE — Progress Notes (Addendum)
[facility] Progress Note Patient Name: Belinda [name] DOB: [birth year] MRN: [id number]   Date of Service  02/11/2020  HPI/Events of Note  Remains agitated. Request for bilateral soft wrist restraints.  eICU Interventions  Plan: 1. Increase ceiling on Versed IV infusion to 12 mg/hour.  2. Bilateral soft wrist restraints X 4 hours.      Intervention Category Major Interventions: Delirium, psychosis, severe agitation - evaluation and management  [name] 02/11/2020, 6:27 AM

## 2020-02-12 ENCOUNTER — Inpatient Hospital Stay (HOSPITAL_COMMUNITY): Payer: Medicaid Other

## 2020-02-12 DIAGNOSIS — N390 Urinary tract infection, site not specified: Secondary | ICD-10-CM | POA: Diagnosis not present

## 2020-02-12 DIAGNOSIS — J9811 Atelectasis: Secondary | ICD-10-CM | POA: Diagnosis not present

## 2020-02-12 DIAGNOSIS — J1282 Pneumonia due to coronavirus disease 2019: Secondary | ICD-10-CM | POA: Diagnosis not present

## 2020-02-12 DIAGNOSIS — J90 Pleural effusion, not elsewhere classified: Secondary | ICD-10-CM | POA: Diagnosis not present

## 2020-02-12 DIAGNOSIS — R0603 Acute respiratory distress: Secondary | ICD-10-CM

## 2020-02-12 DIAGNOSIS — E876 Hypokalemia: Secondary | ICD-10-CM | POA: Diagnosis not present

## 2020-02-12 DIAGNOSIS — K760 Fatty (change of) liver, not elsewhere classified: Secondary | ICD-10-CM | POA: Diagnosis not present

## 2020-02-12 DIAGNOSIS — G9341 Metabolic encephalopathy: Secondary | ICD-10-CM | POA: Diagnosis not present

## 2020-02-12 DIAGNOSIS — J80 Acute respiratory distress syndrome: Secondary | ICD-10-CM | POA: Diagnosis not present

## 2020-02-12 DIAGNOSIS — E8809 Other disorders of plasma-protein metabolism, not elsewhere classified: Secondary | ICD-10-CM | POA: Diagnosis not present

## 2020-02-12 DIAGNOSIS — A4189 Other specified sepsis: Secondary | ICD-10-CM | POA: Diagnosis not present

## 2020-02-12 DIAGNOSIS — Z4682 Encounter for fitting and adjustment of non-vascular catheter: Secondary | ICD-10-CM | POA: Diagnosis not present

## 2020-02-12 DIAGNOSIS — Z9911 Dependence on respirator [ventilator] status: Secondary | ICD-10-CM | POA: Diagnosis not present

## 2020-02-12 DIAGNOSIS — J9601 Acute respiratory failure with hypoxia: Secondary | ICD-10-CM | POA: Diagnosis not present

## 2020-02-12 DIAGNOSIS — U071 COVID-19: Secondary | ICD-10-CM | POA: Diagnosis not present

## 2020-02-12 DIAGNOSIS — I517 Cardiomegaly: Secondary | ICD-10-CM | POA: Diagnosis not present

## 2020-02-12 DIAGNOSIS — J690 Pneumonitis due to inhalation of food and vomit: Secondary | ICD-10-CM | POA: Diagnosis not present

## 2020-02-12 DIAGNOSIS — Z6841 Body Mass Index (BMI) 40.0 and over, adult: Secondary | ICD-10-CM | POA: Diagnosis not present

## 2020-02-12 LAB — POCT I-STAT 7, (LYTES, BLD GAS, ICA,H+H)
Acid-Base Excess: 7 mmol/L — ABNORMAL HIGH (ref 0.0–2.0)
Bicarbonate: 32 mmol/L — ABNORMAL HIGH (ref 20.0–28.0)
Calcium, Ion: 1.12 mmol/L — ABNORMAL LOW (ref 1.15–1.40)
HCT: 26 % — ABNORMAL LOW (ref 36.0–46.0)
Hemoglobin: 8.8 g/dL — ABNORMAL LOW (ref 12.0–15.0)
O2 Saturation: 94 %
Patient temperature: 98.8
Potassium: 2.9 mmol/L — ABNORMAL LOW (ref 3.5–5.1)
Sodium: 147 mmol/L — ABNORMAL HIGH (ref 135–145)
TCO2: 33 mmol/L — ABNORMAL HIGH (ref 22–32)
pCO2 arterial: 47.7 mmHg (ref 32.0–48.0)
pH, Arterial: 7.435 (ref 7.350–7.450)
pO2, Arterial: 72 mmHg — ABNORMAL LOW (ref 83.0–108.0)

## 2020-02-12 LAB — COMPREHENSIVE METABOLIC PANEL
ALT: 74 U/L — ABNORMAL HIGH (ref 0–44)
AST: 50 U/L — ABNORMAL HIGH (ref 15–41)
Albumin: 2.2 g/dL — ABNORMAL LOW (ref 3.5–5.0)
Alkaline Phosphatase: 56 U/L (ref 38–126)
Anion gap: 9 (ref 5–15)
BUN: 19 mg/dL (ref 6–20)
CO2: 28 mmol/L (ref 22–32)
Calcium: 7.5 mg/dL — ABNORMAL LOW (ref 8.9–10.3)
Chloride: 111 mmol/L (ref 98–111)
Creatinine, Ser: 0.56 mg/dL (ref 0.44–1.00)
GFR, Estimated: >60 mL/min (ref >60)
Glucose, Bld: 149 mg/dL — ABNORMAL HIGH (ref 70–99)
Potassium: 3.2 mmol/L — ABNORMAL LOW (ref 3.5–5.1)
Sodium: 148 mmol/L — ABNORMAL HIGH (ref 135–145)
Total Bilirubin: 0.6 mg/dL (ref 0.3–1.2)
Total Protein: 5.1 g/dL — ABNORMAL LOW (ref 6.5–8.1)

## 2020-02-12 LAB — GLUCOSE, CAPILLARY
Glucose-Capillary: 143 mg/dL — ABNORMAL HIGH (ref 70–99)
Glucose-Capillary: 103 mg/dL — ABNORMAL HIGH (ref 70–99)
Glucose-Capillary: 139 mg/dL — ABNORMAL HIGH (ref 70–99)
Glucose-Capillary: 180 mg/dL — ABNORMAL HIGH (ref 70–99)
Glucose-Capillary: 221 mg/dL — ABNORMAL HIGH (ref 70–99)
Glucose-Capillary: 162 mg/dL — ABNORMAL HIGH (ref 70–99)

## 2020-02-12 LAB — ECHOCARDIOGRAM COMPLETE
AR max vel: 2.65 cm2
AV Area VTI: 2.6 cm2
AV Area mean vel: 2.8 cm2
AV Mean grad: 3 mmHg
AV Peak grad: 5.8 mmHg
Ao pk vel: 1.2 m/s
Area-P 1/2: 3.21 cm2
Height: 63 in
S' Lateral: 3.8 cm
Weight: 3848.35 oz

## 2020-02-12 LAB — CBC
HCT: 29.6 % — ABNORMAL LOW (ref 36.0–46.0)
Hemoglobin: 9.9 g/dL — ABNORMAL LOW (ref 12.0–15.0)
MCH: 31.1 pg (ref 26.0–34.0)
MCHC: 33.4 g/dL (ref 30.0–36.0)
MCV: 93.1 fL (ref 80.0–100.0)
Platelets: 255 10*3/uL (ref 150–400)
RBC: 3.18 MIL/uL — ABNORMAL LOW (ref 3.87–5.11)
RDW: 14.6 % (ref 11.5–15.5)
WBC: 9.5 10*3/uL (ref 4.0–10.5)
nRBC: 0.5 % — ABNORMAL HIGH (ref 0.0–0.2)

## 2020-02-12 LAB — FERRITIN: Ferritin: 1090 ng/mL — ABNORMAL HIGH (ref 11–307)

## 2020-02-12 LAB — MAGNESIUM: Magnesium: 2.3 mg/dL (ref 1.7–2.4)

## 2020-02-12 LAB — PHOSPHORUS: Phosphorus: 3.2 mg/dL (ref 2.5–4.6)

## 2020-02-12 LAB — D-DIMER, QUANTITATIVE: D-Dimer, Quant: 1.78 ug/mL-FEU — ABNORMAL HIGH (ref 0.00–0.50)

## 2020-02-12 MED ORDER — PROSOURCE TF PO LIQD
45.0000 mL | Freq: Three times a day (TID) | ORAL | Status: DC
  Administered 2020-02-12 – 2020-03-05 (×61): 45 mL
  Filled 2020-02-12 (×61): qty 45

## 2020-02-12 MED ORDER — FREE WATER
250.0000 mL | Status: DC
  Administered 2020-02-12 – 2020-02-16 (×22): 250 mL

## 2020-02-12 MED ORDER — SODIUM CHLORIDE 0.9% FLUSH
10.0000 mL | Freq: Two times a day (BID) | INTRAVENOUS | Status: DC
  Administered 2020-02-12 – 2020-02-27 (×31): 10 mL

## 2020-02-12 MED ORDER — MIDAZOLAM HCL (PF) 5 MG/ML IJ SOLN
1.0000 mg | INTRAMUSCULAR | Status: DC | PRN
  Administered 2020-02-12: 2 mg via INTRAVENOUS

## 2020-02-12 MED ORDER — SODIUM CHLORIDE 0.9% FLUSH: 10.0000 mL | INTRAVENOUS | Status: DC | PRN

## 2020-02-12 MED ORDER — VITAL 1.5 CAL PO LIQD
1000.0000 mL | ORAL | Status: DC
  Administered 2020-02-12 – 2020-02-17 (×5): 1000 mL
  Filled 2020-02-12 (×8): qty 1000

## 2020-02-12 MED ORDER — FUROSEMIDE 10 MG/ML IJ SOLN
40.0000 mg | Freq: Two times a day (BID) | INTRAMUSCULAR | Status: DC
  Administered 2020-02-12 – 2020-02-13 (×3): 40 mg via INTRAVENOUS
  Filled 2020-02-12 (×3): qty 4

## 2020-02-12 MED ORDER — NOREPINEPHRINE 4 MG/250ML-% IV SOLN
0.0000 ug/min | INTRAVENOUS | Status: DC
  Administered 2020-02-13: 2 ug/min via INTRAVENOUS
  Filled 2020-02-12: qty 250

## 2020-02-12 MED ORDER — POTASSIUM CHLORIDE 20 MEQ PO PACK
40.0000 meq | PACK | Freq: Once | ORAL | Status: AC
  Administered 2020-02-12: 40 meq
  Filled 2020-02-12: qty 2

## 2020-02-12 MED ORDER — MIDAZOLAM HCL 2 MG/2ML IJ SOLN: 1.0000 mg | INTRAMUSCULAR | Status: DC | PRN

## 2020-02-12 MED ORDER — HYDROMORPHONE BOLUS VIA INFUSION
4.0000 mg | Freq: Once | INTRAVENOUS | Status: AC
  Administered 2020-02-12: 4 mg via INTRAVENOUS
  Filled 2020-02-12: qty 4

## 2020-02-12 MED ORDER — MIDAZOLAM BOLUS VIA INFUSION
4.0000 mg | Freq: Once | INTRAVENOUS | Status: AC
  Administered 2020-02-12: 4 mg via INTRAVENOUS
  Filled 2020-02-12: qty 4

## 2020-02-12 MED ORDER — POTASSIUM CHLORIDE 10 MEQ/50ML IV SOLN
10.0000 meq | INTRAVENOUS | Status: AC
  Administered 2020-02-12 (×4): 10 meq via INTRAVENOUS
  Filled 2020-02-12 (×4): qty 50

## 2020-02-12 NOTE — Progress Notes (Signed)
  Echocardiogram 2D Echocardiogram has been performed.  Belinda [name] 02/12/2020, 5:36 PM

## 2020-02-12 NOTE — Plan of Care (Signed)
  Problem: Nutrition: Goal: Adequate nutrition will be maintained Outcome: Progressing   Problem: Coping: Goal: Level of anxiety will decrease Outcome: Progressing   Problem: Elimination: Goal: Will not experience complications related to bowel motility Outcome: Progressing Goal: Will not experience complications related to urinary retention Outcome: Progressing   Problem: Safety: Goal: Ability to remain free from injury will improve Outcome: Progressing   Problem: Skin Integrity: Goal: Risk for impaired skin integrity will decrease Outcome: Progressing   Problem: Respiratory: Goal: Will maintain a patent airway Outcome: Progressing Goal: Complications related to the disease process, condition or treatment will be avoided or minimized Outcome: Progressing   Problem: Safety: Goal: Non-violent Restraint(s) Outcome: Progressing

## 2020-02-12 NOTE — Progress Notes (Signed)
NAME:  Belinda [name], MRN:  [id number], DOB:  12-Jul-1980, LOS: 6 ADMISSION DATE:  02/06/2020, CONSULTATION DATE:  12/16 REFERRING MD:  Josephine Cables ([facility]), CHIEF COMPLAINT:  Acute metabolic encephalopathy and respiratory failure in setting of COVID   BRIEF  39 year old white female, only medical history is obesity.  Presented to [facility] ER 12/15 w/ ARDS and acute metabolic encephalopathy 2/2 COVID  Past Medical History:  Obesity, anxiety, childhood asthma  Significant Hospital Events:  12/15 presented to the emergency room, hypoxic, encephalopathic, Covid positive.CT brain negative for acute injury with only some mild para sinus mucosal thickening with some periodontal disease.  Started on supplemental oxygen, IV Solu-Medrol, IV remdesivir, and baricitinib.  Also started empirically on cefepime  and vancomycin to cover for potential bacterial pneumonia 12/16 multiple reports of worsening confusion, intermittent combativeness, attempts to bite staff.  Not able to take oral medications.  Started on Precedex.  Transferred to [facility]. Changed abx to unasyn, added low dose ativan as pt chronically on xanax.  12/17: Patient more hypoxic, requiring more sedation. Decision made to proceed with intubation.  Intubated, left IJ catheter placed.  PF ratio only 88.  Core track ordered.  Neuromuscular blockade initiated.  Prone protocol initiated.  12/19 - No overnight events but this morning hypertensive and tachycardic. EKG personally reviewed, sinus tachycardia with normal axis. BIS values are low in the 30s. She is still on continuous paralytics.    12/20 -neuromuscular blockade discontinued yesterday.  At this point in time patient is extremely agitated on the ventilator despite Dilaudid infusion Versed infusion, oxycodone schedule, clonopoin scheduloed,  and phenobarb once daily at night.  She is on prednisone and Barcitinib. On vent 50%. AFebrikle  Consults:  Not applicable  Procedures:   Intubation 12/17 Left IJ triple-lumen catheter 12/17   Significant Diagnostic Tests:  CT brain 12/15: Negative for acute CVA or acute process  Micro Data:  RSA PCR 12/15 urine culture>> 80,000 colonies GNR ->Klebsiella (resistant to ampi but sensitive to unasyn) Blood culture 12/15>>> Respiratory panel by RT-PCR 12/15: Positive for Covid   Antimicrobials:  Remdesivir 12/15 Cefepime 12/15-->12/16 Vancomycin 12/15-->12/16 unasyn 12/16 (PCT 25, klbe uti) >>>12/20 xxx covid Rx  - barcitinib - prednisone  Interim History / Subjective:    12/21 - cxr horrible. 40% fio2, pulse ox 90%,.  On dilaudid gtt, oxycdone po, precedex gtt, versed gt and klonopin,  On phenobarb QHS x 2 night.  On haldol schedulesd x 1 dayt.   -. RASS -4 but easily goes to +2 per RN  On TF.  K 2.9 while on 4mkcl daily.    + 4L volume overload  Objective   Blood pressure 95/66, pulse (!) 124, temperature 98.4 F (36.9 C), temperature source Axillary, resp. rate (!) 26, height 5' 3"  (1.6 m), weight 109.1 kg, SpO2 92 %.    Vent Mode: PRVC FiO2 (%):  [40 %-50 %] 40 % Set Rate:  [28 bmp] 28 bmp Vt Set:  [320 mL] 320 mL PEEP:  [8 cmH20] 8 cmH20 Plateau Pressure:  [18 cmH20-21 cmH20] 18 cmH20   Intake/Output Summary (Last 24 hours) at 02/12/2020 0850 Last data filed at 02/12/2020 0800 Gross per 24 hour  Intake 4612.04 ml  Output 2865 ml  Net 1747.04 ml   Filed Weights   02/10/20 0358 02/11/20 0500 02/12/20 0600  Weight: 106.4 kg 108.2 kg 109.1 kg    General Appearance:  Looks criticall ill OBESE - + Head:  Normocephalic, without obvious abnormality, atraumatic Eyes:  PERRL - ye, conjunctiva/corneas - muddy     Ears:  Normal external ear canals, both ears Nose:  G tube - yes Throat:  ETT TUBE - yes , OG tube - no Neck:  Supple,  No enlargement/tenderness/nodules Lungs: Clear to auscultation bilaterally, Ventilator   Synchrony - yesm, 50% Heart:  S1 and S2 normal, no murmur, CVP - no.   Pressors - no Abdomen:  Soft, no masses, no organomegaly Genitalia / Rectal:  Not done Extremities:  Extremities- intact Skin:  ntact in exposed areas . Sacral area - not examined Neurologic:  Sedation - dilaudid gt, versed gtt, oxycodone, klonopn, phenobarb -> RASS - -2 to +2 ,  . Moves all 4s - yes. CAM-ICU - positive . Orientation - not oreinted       Resolved Hospital Problem list     Assessment & Plan:  Acute hypoxic respiratory failure in setting of Covid pneumonia, and ARDS, plus minus CAP versus aspiration - off nimbex 12/19   02/12/2020 - > does not meet criteria for SBT/Extubation in setting of Acute Respiratory Failure despite 40% fio2 due to severe ALI on CXR and severe encephalopathy  Plan PRVC VAP bundle No SBT   COVID-19 s/p remedsivir  12./21 - d-dimer down to 1.78 from 2s.   plan  day 7 steropids - reduced to 36m per day on 02/11/20 Day 6 barcitinb Track d-dimer, crp clinically   Klebsiella urinary tract infection with sepsis syndrome with or without aspiration pneumonia - unasyn ending 02/11/20  -02/01/2020: Afebrile since 12/16. Off Unasyn since 12/20  Plan -clinically monitor   Acute metabolic encephalopathy in the setting of sepsis and Covid infection.  Chronically Xanax dependent   02/12/2020 -significant intermittent agitation despite Dilaudid infusion, oxycodone schedule, Versed infusion and Klonopin schedule, phenobarb, haldol scheduled  Plan PAD protocol Continue Dilaudid, as well as Versed Continue oxycodone and Klonopin DC  phenobarb at night continue Precedex infusion Cotninue cheduled Haldol with QTC monitoring   - check 12 leakd EKG tonight   Fluid and electrolyte imbalance: Hypokalemia severe  - K 2.9  Plan Replace recheck Continue to monitor daily   Volume overload  02/12/2020 - +4L 02/12/20  Plan  - start diuresis - echo  Mild elevated LFTs - transamints  02/12/2020 - improved  Plan monitor  Anemia  of critical illness  02/12/2020 - no bleeding   Plan - PRBC for hgb </= 6.9gm%    - exceptions are   -  if ACS susepcted/confirmed then transfuse for hgb </= 8.0gm%,  or    -  active bleeding with hemodynamic instability, then transfuse regardless of hemoglobin value   At at all times try to transfuse 1 unit prbc as possible with exception of active hemorrhage     Hyperglycemia Plan Resistant SSI Increase TF coverage and add long acting     Best practice (evaluated daily)  Diet: tube feeds advancing to goal Pain/Anxiety/Delirium protocol (if indicated):see above VAP protocol (if indicated): 12/17 DVT prophylaxis: Lovenox GI prophylaxis: PPI Glucose control: Sliding scale insulin  Mobility: Bedrest currently requiring restraints Disposition: Intensive care Family:  DAd Donnie Hollinghead [phone]-> updated 02/11/20 and 02/12/20  Goals of Care:  Last date of multidisciplinary goals of care discussion: via phone so not able to do multidisciplinary meeting  Family and staff present: NP and mother dianne  Summary of discussion: Full code  Follow up goals of care discussion due: Goals of care discussion due 12/23   Code Status: Full code  ATTESTATION & SIGNATURE   The patient Belinda [name] is critically ill with multiple organ systems failure and requires high complexity decision making for assessment and support, frequent evaluation and titration of therapies, application of advanced monitoring technologies and extensive interpretation of multiple databases.   Critical Care Time devoted to patient care services described in this note is  35  Minutes. This time reflects time of care of this signee Dr Brand Males. This critical care time does not reflect procedure time, or teaching time or supervisory time of PA/NP/Med student/Med Resident etc but could involve care discussion time     Dr. Brand Males, M.D., [facility].C.P Pulmonary and Critical Care  Medicine Staff Physician [place] Pulmonary and Critical Care Pager: [phone], If no answer or between  15:00h - 7:00h: call [phone]  02/12/2020 9:09 AM    LABS    PULMONARY Recent Labs  Lab 02/08/20 1540 02/09/20 0454 02/09/20 0806 02/09/20 1414 02/10/20 0435 02/11/20 0456 02/12/20 0522  PHART 7.301* 7.345* 7.333* 7.356 7.392 7.376 7.435  PCO2ART 58.4* 52.5* 50.7* 48.8* 44.2 51.6* 47.7  PO2ART 149* 98 79* 82* 89.6 119* 72*  HCO3 28.5* 28.8* 27.2 27.5 26.5 29.6* 32.0*  TCO2 30 30 29 29   --   --  33*  O2SAT 99.0 97.0 95.0 96.0 96.7 97.8 94.0    CBC Recent Labs  Lab 02/10/20 0350 02/11/20 0455 02/12/20 0332 02/12/20 0522  HGB 10.3* 10.2* 9.9* 8.8*  HCT 31.8* 31.9* 29.6* 26.0*  WBC 7.2 11.7* 9.5  --   PLT 210 252 255  --     COAGULATION Recent Labs  Lab 02/06/20 1834  INR 1.1    CARDIAC  No results for input(s): TROPONINI in the last 168 hours. No results for input(s): PROBNP in the last 168 hours.   CHEMISTRY Recent Labs  Lab 02/08/20 0300 02/08/20 0628 02/08/20 1030 02/08/20 1540 02/08/20 1654 02/09/20 0423 02/09/20 0454 02/09/20 1414 02/09/20 2230 02/10/20 0350 02/11/20 0455 02/12/20 0332 02/12/20 0522  NA 143   < >  --    < >  --  143   < > 147*  --  145 148* 148* 147*  K 3.3*   < >  --    < >  --  3.8   < > 3.7  --  3.5 3.0* 3.2* 2.9*  CL 109  --   --   --   --  109  --   --   --  110 112* 111  --   CO2 22  --   --   --   --  23  --   --   --  24 28 28   --   GLUCOSE 192*  --   --   --   --  239*  --   --   --  252* 129* 149*  --   BUN 10  --   --   --   --  15  --   --   --  20 20 19   --   CREATININE 0.88  --   --   --   --  0.74  --   --   --  0.64 0.65 0.56  --   CALCIUM 7.8*  --   --   --   --  7.5*  --   --   --  7.6* 7.6* 7.5*  --   MG 2.2  --  2.2  --  2.2 2.6*  --   --  2.6*  --   --  2.3  --   PHOS 2.2*  --  2.5  --  4.2 2.7  --   --  1.5*  --   --  3.2  --    < > = values in this interval not  displayed.   Estimated Creatinine Clearance: 113 mL/min (by C-G formula based on SCr of 0.56 mg/dL).   LIVER Recent Labs  Lab 02/06/20 1834 02/07/20 0513 02/08/20 0300 02/09/20 0423 02/10/20 0350 02/11/20 0455 02/12/20 0332  AST 111*   < > 119* 155* 75* 49* 50*  ALT 56*   < > 59* 102* 92* 81* 74*  ALKPHOS 108   < > 83 76 69 51 56  BILITOT 0.8   < > 0.7 0.9 0.4 0.7 0.6  PROT 7.2   < > 6.0* 5.5* 5.1* 5.1* 5.1*  ALBUMIN 3.2*   < > 2.6* 2.5* 2.2* 2.3* 2.2*  INR 1.1  --   --   --   --   --   --    < > = values in this interval not displayed.     INFECTIOUS Recent Labs  Lab 02/06/20 1834 02/06/20 2019  LATICACIDVEN 1.5 1.2  PROCALCITON  --  28.52     ENDOCRINE CBG (last 3)  Recent Labs    02/11/20 2312 02/12/20 0339 02/12/20 0735  GLUCAP 185* 143* 103*         IMAGING x48h  - image(s) personally visualized  -   highlighted in bold DG CHEST PORT 1 VIEW  Result Date: 02/12/2020 CLINICAL DATA:  Endotracheal tube placement. EXAM: PORTABLE CHEST 1 VIEW COMPARISON:  02/11/2020. FINDINGS: Endotracheal tube and feeding tube in stable position. Left IJ line stable position. Cardiomegaly. Low lung volumes with bibasilar atelectasis. Diffuse bilateral pulmonary infiltrates/edema again noted. Small bilateral pleural effusions again noted. Chest is unchanged from prior exam. No pneumothorax. IMPRESSION: 1. Lines and tubes in stable position. 2. Cardiomegaly with persistent bilateral pulmonary infiltrates/edema and small bilateral pleural effusions. 3. Low lung volumes with bibasilar atelectasis. Chest is unchanged from prior exam. Electronically Signed   By: Marcello Moores  Register   On: 02/12/2020 05:21   DG CHEST PORT 1 VIEW  Result Date: 02/11/2020 CLINICAL DATA:  Hypoxia EXAM: PORTABLE CHEST 1 VIEW COMPARISON:  February 08, 2020 FINDINGS: Endotracheal tube tip is 3.0 cm above the carina. Enteric tube tip is below the diaphragm. Central catheter tip is in the superior vena cava  slightly beyond the junction with the left innominate vein. No pneumothorax. There is cardiomegaly with pulmonary venous hypertension. There is multifocal airspace opacity throughout the lungs bilaterally with equivocal pleural effusions bilaterally. No bone lesions. No adenopathy appreciable. IMPRESSION: Tube and catheter positions as described without pneumothorax. Cardiomegaly with pulmonary vascular congestion. Small pleural effusions. Multifocal airspace opacity may represent pulmonary edema or multifocal pneumonia. Both entities may be present concurrently. Appearance overall similar to recent study. Electronically Signed   By: Lowella Grip III M.D.   On: 02/11/2020 08:51

## 2020-02-12 NOTE — Progress Notes (Signed)
Nutrition Follow-up  DOCUMENTATION CODES:   Morbid obesity  INTERVENTION:   Tube Feeding via Cortrak:  Change to Vital 1.5 at 55 ml/hr Pro-Source TF 45 mLTID Provides 122 g of protein, 2100 kcals and 1003 mL of free water Meets 100% estimated calorie and protein needs  Recommend increasing free water flushes given hypernatremia  NUTRITION DIAGNOSIS:   Inadequate oral intake related to acute illness as evidenced by NPO status.  Being addressed via TF   GOAL:   Patient will meet greater than or equal to 90% of their needs  Progressing   MONITOR:   Vent status,TF tolerance,Weight trends,Labs  REASON FOR ASSESSMENT:   Consult,Ventilator Enteral/tube feeding initiation and management  ASSESSMENT:   39 yo female admitted with severe COVID-19 pneumonia with ARDS requiring intubation. No PMH other than childhood asthma, anxiety  12/17 Cortrak placed  Pt remains on vent support, requiring precedex, versed and hydromorphone for sedation. Not currently prone, off paralytics  Tolerating Vital AF 1.2 at 55 ml/hr via Cortrak  No skin breakdown noted per RN skin assessment  Hypernatremic, free water flush of 200 mL q 6 hours since 12/17. Recommend increasing free water   Labs: sodium 147 (H), potassium 2.9 (L) Meds: Vit C, zinc sulfate, lasix, ss novolog, MVI, lantus, prednisone  Diet Order:   Diet Order            Diet NPO time specified  Diet effective now                 EDUCATION NEEDS:   Not appropriate for education at this time  Skin:  Skin Assessment: Reviewed RN Assessment  Last BM:  12/21 rectal tube  Height:   Ht Readings from Last 1 Encounters:  02/06/20 5' 3"  (1.6 m)    Weight:   Wt Readings from Last 1 Encounters:  02/12/20 109.1 kg    BMI:  Body mass index is 42.61 kg/m.  Estimated Nutritional Needs:   Kcal:  1400-1700 kcals  Protein:  115-130 g  Fluid:  >/= 2 L   Kerman Passey MS, RDN, LDN, CNSC Registered Dietitian  III Clinical Nutrition RD Pager and On-Call Pager Number Located in [place]

## 2020-02-12 NOTE — Progress Notes (Signed)
Went to empty patient's foley catheter bag and there was so much sediment I had occluded the drainage area inside the bag. Multiple attempts to drain bag and seal was broken on foley to place a clean bag to foley. Will continue to monitor closely. Bartholomew Crews, RN 02/12/2020 12:07 PM

## 2020-02-13 ENCOUNTER — Inpatient Hospital Stay (HOSPITAL_COMMUNITY): Payer: Medicaid Other

## 2020-02-13 DIAGNOSIS — G9341 Metabolic encephalopathy: Secondary | ICD-10-CM | POA: Diagnosis not present

## 2020-02-13 DIAGNOSIS — J9601 Acute respiratory failure with hypoxia: Secondary | ICD-10-CM | POA: Diagnosis not present

## 2020-02-13 DIAGNOSIS — U071 COVID-19: Secondary | ICD-10-CM | POA: Diagnosis not present

## 2020-02-13 DIAGNOSIS — I509 Heart failure, unspecified: Secondary | ICD-10-CM | POA: Diagnosis not present

## 2020-02-13 DIAGNOSIS — I517 Cardiomegaly: Secondary | ICD-10-CM | POA: Diagnosis not present

## 2020-02-13 DIAGNOSIS — R0902 Hypoxemia: Secondary | ICD-10-CM | POA: Diagnosis not present

## 2020-02-13 DIAGNOSIS — J80 Acute respiratory distress syndrome: Secondary | ICD-10-CM | POA: Diagnosis not present

## 2020-02-13 DIAGNOSIS — J9600 Acute respiratory failure, unspecified whether with hypoxia or hypercapnia: Secondary | ICD-10-CM | POA: Diagnosis not present

## 2020-02-13 DIAGNOSIS — J189 Pneumonia, unspecified organism: Secondary | ICD-10-CM | POA: Diagnosis not present

## 2020-02-13 DIAGNOSIS — J90 Pleural effusion, not elsewhere classified: Secondary | ICD-10-CM | POA: Diagnosis not present

## 2020-02-13 LAB — COMPREHENSIVE METABOLIC PANEL
ALT: 66 U/L — ABNORMAL HIGH (ref 0–44)
AST: 40 U/L (ref 15–41)
Albumin: 2.5 g/dL — ABNORMAL LOW (ref 3.5–5.0)
Alkaline Phosphatase: 60 U/L (ref 38–126)
Anion gap: 12 (ref 5–15)
BUN: 19 mg/dL (ref 6–20)
CO2: 30 mmol/L (ref 22–32)
Calcium: 7.9 mg/dL — ABNORMAL LOW (ref 8.9–10.3)
Chloride: 101 mmol/L (ref 98–111)
Creatinine, Ser: 0.62 mg/dL (ref 0.44–1.00)
GFR, Estimated: >60 mL/min (ref >60)
Glucose, Bld: 160 mg/dL — ABNORMAL HIGH (ref 70–99)
Potassium: 3.4 mmol/L — ABNORMAL LOW (ref 3.5–5.1)
Sodium: 143 mmol/L (ref 135–145)
Total Bilirubin: 0.6 mg/dL (ref 0.3–1.2)
Total Protein: 5.6 g/dL — ABNORMAL LOW (ref 6.5–8.1)

## 2020-02-13 LAB — GLUCOSE, CAPILLARY
Glucose-Capillary: 101 mg/dL — ABNORMAL HIGH (ref 70–99)
Glucose-Capillary: 108 mg/dL — ABNORMAL HIGH (ref 70–99)
Glucose-Capillary: 110 mg/dL — ABNORMAL HIGH (ref 70–99)
Glucose-Capillary: 128 mg/dL — ABNORMAL HIGH (ref 70–99)
Glucose-Capillary: 138 mg/dL — ABNORMAL HIGH (ref 70–99)
Glucose-Capillary: 146 mg/dL — ABNORMAL HIGH (ref 70–99)
Glucose-Capillary: 158 mg/dL — ABNORMAL HIGH (ref 70–99)
Glucose-Capillary: 167 mg/dL — ABNORMAL HIGH (ref 70–99)

## 2020-02-13 LAB — LACTIC ACID, PLASMA: Lactic Acid, Venous: 1.9 mmol/L (ref 0.5–1.9)

## 2020-02-13 LAB — CBC
HCT: 31.8 % — ABNORMAL LOW (ref 36.0–46.0)
Hemoglobin: 10.3 g/dL — ABNORMAL LOW (ref 12.0–15.0)
MCH: 30.2 pg (ref 26.0–34.0)
MCHC: 32.4 g/dL (ref 30.0–36.0)
MCV: 93.3 fL (ref 80.0–100.0)
Platelets: 271 10*3/uL (ref 150–400)
RBC: 3.41 MIL/uL — ABNORMAL LOW (ref 3.87–5.11)
RDW: 14.2 % (ref 11.5–15.5)
WBC: 10.2 10*3/uL (ref 4.0–10.5)
nRBC: 0.3 % — ABNORMAL HIGH (ref 0.0–0.2)

## 2020-02-13 LAB — PHOSPHORUS: Phosphorus: 3.9 mg/dL (ref 2.5–4.6)

## 2020-02-13 LAB — MAGNESIUM: Magnesium: 2.2 mg/dL (ref 1.7–2.4)

## 2020-02-13 MED ORDER — FENTANYL 2500MCG IN NS 250ML (10MCG/ML) PREMIX INFUSION
25.0000 ug/h | INTRAVENOUS | Status: DC
  Administered 2020-02-13: 400 ug/h via INTRAVENOUS
  Administered 2020-02-13: 200 ug/h via INTRAVENOUS
  Administered 2020-02-14: 350 ug/h via INTRAVENOUS
  Administered 2020-02-14 – 2020-02-16 (×8): 400 ug/h via INTRAVENOUS
  Administered 2020-02-16: 350 ug/h via INTRAVENOUS
  Administered 2020-02-16: 375 ug/h via INTRAVENOUS
  Administered 2020-02-16 – 2020-02-18 (×7): 400 ug/h via INTRAVENOUS
  Administered 2020-02-18: 350 ug/h via INTRAVENOUS
  Administered 2020-02-19 – 2020-02-20 (×6): 300 ug/h via INTRAVENOUS
  Administered 2020-02-21: 350 ug/h via INTRAVENOUS
  Administered 2020-02-21: 250 ug/h via INTRAVENOUS
  Administered 2020-02-21: 350 ug/h via INTRAVENOUS
  Administered 2020-02-22: 300 ug/h via INTRAVENOUS
  Administered 2020-02-22: 100 ug/h via INTRAVENOUS
  Administered 2020-02-24: 75 ug/h via INTRAVENOUS
  Filled 2020-02-13 (×34): qty 250

## 2020-02-13 MED ORDER — POLYETHYLENE GLYCOL 3350 17 G PO PACK
17.0000 g | PACK | Freq: Every day | ORAL | Status: DC
  Administered 2020-02-15 – 2020-02-19 (×5): 17 g
  Filled 2020-02-13 (×5): qty 1

## 2020-02-13 MED ORDER — FUROSEMIDE 10 MG/ML IJ SOLN
40.0000 mg | Freq: Every day | INTRAMUSCULAR | Status: DC
  Administered 2020-02-14: 40 mg via INTRAVENOUS
  Filled 2020-02-13: qty 4

## 2020-02-13 MED ORDER — POTASSIUM CHLORIDE 20 MEQ PO PACK
40.0000 meq | PACK | Freq: Once | ORAL | Status: AC
  Administered 2020-02-13: 40 meq
  Filled 2020-02-13: qty 2

## 2020-02-13 MED ORDER — PROPOFOL 1000 MG/100ML IV EMUL
0.0000 ug/kg/min | INTRAVENOUS | Status: DC
  Administered 2020-02-13: 40 ug/kg/min via INTRAVENOUS
  Administered 2020-02-14 (×3): 50 ug/kg/min via INTRAVENOUS
  Filled 2020-02-13 (×4): qty 100

## 2020-02-13 MED ORDER — FENTANYL CITRATE (PF) 100 MCG/2ML IJ SOLN: 50.0000 ug | Freq: Once | INTRAMUSCULAR | Status: DC

## 2020-02-13 MED ORDER — LORAZEPAM 2 MG/ML IJ SOLN
INTRAMUSCULAR | Status: AC
  Administered 2020-02-13: 4 mg via INTRAVENOUS
  Filled 2020-02-13: qty 2

## 2020-02-13 MED ORDER — MIDAZOLAM HCL 2 MG/2ML IJ SOLN
1.0000 mg | INTRAMUSCULAR | Status: DC | PRN
  Administered 2020-02-13 – 2020-02-15 (×5): 4 mg via INTRAVENOUS
  Administered 2020-02-16: 2 mg via INTRAVENOUS
  Administered 2020-02-16 – 2020-02-17 (×3): 4 mg via INTRAVENOUS
  Filled 2020-02-13: qty 4

## 2020-02-13 MED ORDER — LORAZEPAM 2 MG/ML IJ SOLN
1.0000 mg | INTRAMUSCULAR | Status: DC | PRN
  Administered 2020-02-13 – 2020-02-16 (×5): 4 mg via INTRAVENOUS
  Administered 2020-02-16: 2 mg via INTRAVENOUS
  Administered 2020-02-17: 4 mg via INTRAVENOUS
  Administered 2020-02-17: 2 mg via INTRAVENOUS
  Administered 2020-02-17 (×2): 4 mg via INTRAVENOUS
  Filled 2020-02-13 (×4): qty 2
  Filled 2020-02-13: qty 1
  Filled 2020-02-13: qty 2
  Filled 2020-02-13: qty 1
  Filled 2020-02-13 (×3): qty 2

## 2020-02-13 MED ORDER — POTASSIUM CHLORIDE 10 MEQ/50ML IV SOLN
10.0000 meq | INTRAVENOUS | Status: AC
Start: 2020-02-13 — End: 2020-02-13
  Administered 2020-02-13 (×3): 10 meq via INTRAVENOUS
  Filled 2020-02-13 (×3): qty 50

## 2020-02-13 MED ORDER — DOCUSATE SODIUM 50 MG/5ML PO LIQD
100.0000 mg | Freq: Two times a day (BID) | ORAL | Status: DC
  Administered 2020-02-14 – 2020-02-19 (×11): 100 mg
  Filled 2020-02-13 (×11): qty 10

## 2020-02-13 MED ORDER — PROPOFOL 1000 MG/100ML IV EMUL
INTRAVENOUS | Status: AC
  Administered 2020-02-13: 5 ug/kg/min via INTRAVENOUS
  Filled 2020-02-13: qty 100

## 2020-02-13 MED ORDER — FENTANYL BOLUS VIA INFUSION
50.0000 ug | INTRAVENOUS | Status: DC | PRN
  Administered 2020-02-13 – 2020-02-22 (×51): 50 ug via INTRAVENOUS
  Filled 2020-02-13: qty 50

## 2020-02-13 NOTE — Plan of Care (Signed)
  Problem: Nutrition: Goal: Adequate nutrition will be maintained Outcome: Progressing   Problem: Coping: Goal: Level of anxiety will decrease Outcome: Progressing   Problem: Safety: Goal: Ability to remain free from injury will improve Outcome: Progressing   Problem: Skin Integrity: Goal: Risk for impaired skin integrity will decrease Outcome: Progressing   Problem: Respiratory: Goal: Will maintain a patent airway Outcome: Progressing   Problem: Safety: Goal: Non-violent Restraint(s) Outcome: Progressing

## 2020-02-13 NOTE — Progress Notes (Signed)
[facility] Progress Note Patient Name: Belinda [name] DOB: 1980/09/15 MRN: [id number]   Date of Service  02/13/2020  HPI/Events of Note  Renew restraints.   eICU Interventions  Renewed to prevent self injury and harm, camera eval done earlier also.      Intervention Category Minor Interventions: Other:  [name] 02/13/2020, 11:30 PM

## 2020-02-13 NOTE — Progress Notes (Addendum)
NAME:  Belinda [name], MRN:  [id number], DOB:  24-Jan-1981, LOS: 7 ADMISSION DATE:  02/06/2020, CONSULTATION DATE:  12/16 REFERRING MD:  Josephine Cables ([facility]), CHIEF COMPLAINT:  Acute metabolic encephalopathy and respiratory failure in setting of COVID   BRIEF  39 year old white female, only medical history is obesity.  Presented to [facility] ER 12/15 w/ ARDS and acute metabolic encephalopathy 2/2 COVID  Past Medical History:  Obesity, anxiety, childhood asthma  Significant Hospital Events:  12/15 presented to the emergency room, hypoxic, encephalopathic, Covid positive.CT brain negative for acute injury with only some mild para sinus mucosal thickening with some periodontal disease.  Started on supplemental oxygen, IV Solu-Medrol, IV remdesivir, and baricitinib.  Also started empirically on cefepime  and vancomycin to cover for potential bacterial pneumonia 12/16 multiple reports of worsening confusion, intermittent combativeness, attempts to bite staff.  Not able to take oral medications.  Started on Precedex.  Transferred to [facility]. Changed abx to unasyn, added low dose ativan as pt chronically on xanax.  12/17: Patient more hypoxic, requiring more sedation. Decision made to proceed with intubation.  Intubated, left IJ catheter placed.  PF ratio only 88.  Core track ordered.  Neuromuscular blockade initiated.  Prone protocol initiated.  12/19 - No overnight events but this morning hypertensive and tachycardic. EKG personally reviewed, sinus tachycardia with normal axis. BIS values are low in the 30s. She is still on continuous paralytics.    12/20 -neuromuscular blockade discontinued yesterday.  At this point in time patient is extremely agitated on the ventilator despite Dilaudid infusion Versed infusion, oxycodone schedule, clonopoin scheduloed,  and phenobarb once daily at night.  She is on prednisone and Barcitinib. On vent 50%. AFebrikle  12/21  - 12/21 - cxr horrible. 40% fio2, pulse  ox 90%,.  On dilaudid gtt, oxycdone po, precedex gtt, versed gt and klonopin,  On phenobarb QHS x 2 night.  On haldol schedulesd x 1 dayt.   -. RASS -4 but easily goes to +2 per RN. On TF.  K 2.9 while on 66mkcl daily. + 4L volume overloa  Consults:  Not applicable  Procedures:  Intubation 12/17 Left IJ triple-lumen catheter 12/17   Significant Diagnostic Tests:  CT brain 12/15: Negative for acute CVA or acute process  Micro Data:  RSA PCR 12/15 urine culture>> 80,000 colonies GNR ->Klebsiella (resistant to ampi but sensitive to unasyn) Blood culture 12/15>>> Respiratory panel by RT-PCR 12/15: Positive for Covid   Antimicrobials:  Remdesivir 12/15 Cefepime 12/15-->12/16 Vancomycin 12/15-->12/16 unasyn 12/16 (PCT 25, klbe uti) >>>12/20 xxx covid Rx  - barcitinib - endds - prednisone - ends 12/24  Interim History / Subjective:    12/22 - 40% fio2. On dilaudid gtt, [precedex gtt, versed gtt. On oxy and klonopin. Still very agitated On TF  -2L since admit  Objective   Blood pressure 131/77, pulse (!) 146, temperature (!) 97.4 F (36.3 C), temperature source Axillary, resp. rate (!) 22, height 5' 3"  (1.6 m), weight 109.1 kg, SpO2 90 %.    Vent Mode: PRVC FiO2 (%):  [40 %-41 %] 40 % Set Rate:  [28 bmp] 28 bmp Vt Set:  [320 mL] 320 mL PEEP:  [5 cmH20] 5 cmH20 Plateau Pressure:  [17 cmH20-19 cmH20] 18 cmH20   Intake/Output Summary (Last 24 hours) at 02/13/2020 1147 Last data filed at 02/13/2020 1100 Gross per 24 hour  Intake 2592.11 ml  Output 8387.5 ml  Net -5795.39 ml   Filed Weights   02/10/20 0358 02/11/20 0500  02/12/20 0600  Weight: 106.4 kg 108.2 kg 109.1 kg    General Appearance:  Looks criticall ill OBESE - + Head:  Normocephalic, without obvious abnormality, atraumatic Eyes:  PERRL - ye, conjunctiva/corneas - muddy     Ears:  Normal external ear canals, both ears Nose:  G tube - yes Throat:  ETT TUBE - yes , OG tube - no Neck:  Supple,  No  enlargement/tenderness/nodules Lungs: Clear to auscultation bilaterally, Ventilator   Synchrony - yesm, 50% Heart:  S1 and S2 normal, no murmur, CVP - no.  Pressors - no Abdomen:  Soft, no masses, no organomegaly Genitalia / Rectal:  Not done Extremities:  Extremities- intact Skin:  ntact in exposed areas . Sacral area - not examined Neurologic:  Sedation - dilaudid gt, versed gtt, oxycodone, klonopn, phenobarb -> RASS - -2 to +2 ,  . Moves all 4s - yes. CAM-ICU - positive . Orientation - not oreinted    Similar to  yesterday   Resolved Hospital Problem list     Assessment & Plan:  Acute hypoxic respiratory failure in setting of Covid pneumonia, and ARDS, plus minus CAP versus aspiration - off nimbex 12/19   02/13/2020 - > does not meet criteria for SBT/Extubation in setting of Acute Respiratory Failure despite 40% fio2 due to severe ALI on CXR and severe encephalopathy  Plan PRVC VAP bundle No SBT Likely needs trach  COVID-19 s/p remedsivir  12./21 - d-dimer down to 1.78 from 2s.   plan  day 8 steropids - reduced to 73m per day on 02/11/20 -> wil end 02/15/20 Day 7 barcitinb Track d-dimer, crp clinically   Klebsiella urinary tract infection with sepsis syndrome with or without aspiration pneumonia - unasyn ending 02/11/20  -02/01/2020: Afebrile since 12/16. Off Unasyn since 12/20  Plan -clinically monitor   Chronic xanax gtt Acute metabolic encephalopathy in the setting of sepsis and Covid infection.  Chronically Xanax dependent   02/13/2020 -significant intermittent agitation despite Dilaudid infusion, oxycodone schedule, Versed infusion and Klonopin schedule, , haldol scheduled. QTc 430 msec  Plan PAD protocol Rotate  Dilaudid to fentanyl gtt, Versed gtt Continue oxycodone and Klonopin continue Precedex infusion Cotninue cheduled Haldol with QTC monitoring    Fluid and electrolyte imbalance: Hypokalemia severe  - K 3s  Plan Replace  recheck Continue to monitor daily   Volume overload  02/13/2020 - +4L 02/12/20  Plan  - start diuresis - echo  Mild elevated LFTs - transamints  02/13/2020 - improved  Plan monitor  Anemia of critical illness  02/13/2020 - no bleeding   Plan - PRBC for hgb </= 6.9gm%    - exceptions are   -  if ACS susepcted/confirmed then transfuse for hgb </= 8.0gm%,  or    -  active bleeding with hemodynamic instability, then transfuse regardless of hemoglobin value   At at all times try to transfuse 1 unit prbc as possible with exception of active hemorrhage     Hyperglycemia Plan Resistant SSI Increase TF coverage and add long acting     Best practice (evaluated daily)  Diet: tube feeds advancing to goal Pain/Anxiety/Delirium protocol (if indicated):see above VAP protocol (if indicated): 12/17 DVT prophylaxis: Lovenox GI prophylaxis: PPI Glucose control: Sliding scale insulin  Mobility: Bedrest currently requiring restraints Disposition: Intensive care Family:  DAd Donnie Hollinghead [phone]-> updated 02/11/20 and 02/12/20 and 02/13/20  Goals of Care:  Last date of multidisciplinary goals of care discussion: via phone so not able to  do multidisciplinary meeting  Family and staff present: NP and mother dianne  Summary of discussion: Full code  Follow up goals of care discussion due: Goals of care discussion due 12/23   Code Status: Full code      [place]   The patient Belinda [name] is critically ill with multiple organ systems failure and requires high complexity decision making for assessment and support, frequent evaluation and titration of therapies, application of advanced monitoring technologies and extensive interpretation of multiple databases.   Critical Care Time devoted to patient care services described in this note is  40  Minutes. This time reflects time of care of this signee Dr Brand Males. This critical care time  does not reflect procedure time, or teaching time or supervisory time of PA/NP/Med student/Med Resident etc but could involve care discussion time     Dr. Brand Males, M.D., [facility].C.P Pulmonary and Critical Care Medicine Staff Physician [place] [facility] Pager: [phone], If no answer or between  15:00h - 7:00h: call [phone]  02/13/2020 12:12 PM     LABS    PULMONARY Recent Labs  Lab 02/08/20 1540 02/09/20 0454 02/09/20 0806 02/09/20 1414 02/10/20 0435 02/11/20 0456 02/12/20 0522  PHART 7.301* 7.345* 7.333* 7.356 7.392 7.376 7.435  PCO2ART 58.4* 52.5* 50.7* 48.8* 44.2 51.6* 47.7  PO2ART 149* 98 79* 82* 89.6 119* 72*  HCO3 28.5* 28.8* 27.2 27.5 26.5 29.6* 32.0*  TCO2 30 30 29 29   --   --  33*  O2SAT 99.0 97.0 95.0 96.0 96.7 97.8 94.0    CBC Recent Labs  Lab 02/11/20 0455 02/12/20 0332 02/12/20 0522 02/13/20 0251  HGB 10.2* 9.9* 8.8* 10.3*  HCT 31.9* 29.6* 26.0* 31.8*  WBC 11.7* 9.5  --  10.2  PLT 252 255  --  271    COAGULATION Recent Labs  Lab 02/06/20 1834  INR 1.1    CARDIAC  No results for input(s): TROPONINI in the last 168 hours. No results for input(s): PROBNP in the last 168 hours.   CHEMISTRY Recent Labs  Lab 02/08/20 1654 02/09/20 0423 02/09/20 0454 02/09/20 2230 02/10/20 0350 02/11/20 0455 02/12/20 0332 02/12/20 0522 02/13/20 0251  NA  --  143   < >  --  145 148* 148* 147* 143  K  --  3.8   < >  --  3.5 3.0* 3.2* 2.9* 3.4*  CL  --  109  --   --  110 112* 111  --  101  CO2  --  23  --   --  24 28 28   --  30  GLUCOSE  --  239*  --   --  252* 129* 149*  --  160*  BUN  --  15  --   --  20 20 19   --  19  CREATININE  --  0.74  --   --  0.64 0.65 0.56  --  0.62  CALCIUM  --  7.5*  --   --  7.6* 7.6* 7.5*  --  7.9*  MG 2.2 2.6*  --  2.6*  --   --  2.3  --  2.2  PHOS 4.2 2.7  --  1.5*  --   --  3.2  --  3.9   < > = values in this interval not displayed.   Estimated Creatinine  Clearance: 113 mL/min (by C-G formula based on SCr of 0.62  mg/dL).   LIVER Recent Labs  Lab 02/06/20 1834 02/07/20 0513 02/09/20 0423 02/10/20 0350 02/11/20 0455 02/12/20 0332 02/13/20 0251  AST 111*   < > 155* 75* 49* 50* 40  ALT 56*   < > 102* 92* 81* 74* 66*  ALKPHOS 108   < > 76 69 51 56 60  BILITOT 0.8   < > 0.9 0.4 0.7 0.6 0.6  PROT 7.2   < > 5.5* 5.1* 5.1* 5.1* 5.6*  ALBUMIN 3.2*   < > 2.5* 2.2* 2.3* 2.2* 2.5*  INR 1.1  --   --   --   --   --   --    < > = values in this interval not displayed.     INFECTIOUS Recent Labs  Lab 02/06/20 1834 02/06/20 2019  LATICACIDVEN 1.5 1.2  PROCALCITON  --  28.52     ENDOCRINE CBG (last 3)  Recent Labs    02/13/20 0354 02/13/20 0533 02/13/20 0806  GLUCAP 128* 146* 138*         IMAGING x48h  - image(s) personally visualized  -   highlighted in bold DG CHEST PORT 1 VIEW  Result Date: 02/13/2020 CLINICAL DATA:  Hypoxia EXAM: PORTABLE CHEST 1 VIEW COMPARISON:  February 12, 2020 FINDINGS: Endotracheal tube tip is 1.7 cm above the carina. Enteric tube tip is below the diaphragm. Central catheter tip is in the left innominate vein near the junction with the superior vena cava. No pneumothorax. There is airspace opacity in both mid and lower lung regions with small pleural effusions bilaterally. Heart is enlarged with pulmonary vascularity normal, stable. No adenopathy. No bone lesions. IMPRESSION: Tube and catheter positions as described without pneumothorax. Persistent airspace opacity in the mid and lower lung regions with bilateral pleural effusions. Cardiomegaly. Overall appearance raises concern for a degree of congestive heart failure. There may well be superimposed pneumonia in the lung bases. Both edema and pneumonia may present concurrently. Electronically Signed   By: Lowella Grip III M.D.   On: 02/13/2020 08:03   DG CHEST PORT 1 VIEW  Result Date: 02/12/2020 CLINICAL DATA:  Endotracheal tube placement. EXAM:  PORTABLE CHEST 1 VIEW COMPARISON:  02/11/2020. FINDINGS: Endotracheal tube and feeding tube in stable position. Left IJ line stable position. Cardiomegaly. Low lung volumes with bibasilar atelectasis. Diffuse bilateral pulmonary infiltrates/edema again noted. Small bilateral pleural effusions again noted. Chest is unchanged from prior exam. No pneumothorax. IMPRESSION: 1. Lines and tubes in stable position. 2. Cardiomegaly with persistent bilateral pulmonary infiltrates/edema and small bilateral pleural effusions. 3. Low lung volumes with bibasilar atelectasis. Chest is unchanged from prior exam. Electronically Signed   By: Marcello Moores  Register   On: 02/12/2020 05:21   ECHOCARDIOGRAM COMPLETE  Result Date: 02/12/2020    ECHOCARDIOGRAM REPORT   Patient Name:   Belinda [name] Date of Exam: 02/12/2020 Medical Rec #:  [id number]        Height:       63.0 in Accession #:    [id number]       Weight:       240.5 lb Date of Birth:  Oct 27, 1980        BSA:          2.091 m Patient Age:    39 years         BP:           99/70 mmHg Patient Gender: F  HR:           54 bpm. Exam Location:  Inpatient Procedure: 2D Echo, Cardiac Doppler and Color Doppler Indications:    Acute respiratory distress R06.03  History:        Patient has no prior history of Echocardiogram examinations.  Sonographer:    Clayton Lefort RDCS (AE) Referring Phys: [id number] [facility]  Sonographer Comments: Image acquisition challenging due to patient body habitus. IMPRESSIONS  1. Left ventricular ejection fraction, by estimation, is 55 to 60%. The left ventricle has normal function. The left ventricle has no regional wall motion abnormalities. Left ventricular diastolic parameters were normal.  2. Right ventricular systolic function is normal. The right ventricular size is normal. The estimated right ventricular systolic pressure is 16.1 mmHg.  3. The mitral valve is grossly normal. Trivial mitral valve regurgitation.  4. The aortic valve is  tricuspid. Aortic valve regurgitation is not visualized.  5. The inferior vena cava is normal in size with greater than 50% respiratory variability, suggesting right atrial pressure of 3 mmHg. Conclusion(s)/Recommendation(s): Normal biventricular function without evidence of hemodynamically significant valvular heart disease. FINDINGS  Left Ventricle: Left ventricular ejection fraction, by estimation, is 55 to 60%. The left ventricle has normal function. The left ventricle has no regional wall motion abnormalities. The left ventricular internal cavity size was normal in size. There is  no left ventricular hypertrophy. Left ventricular diastolic parameters were normal. Right Ventricle: The right ventricular size is normal. No increase in right ventricular wall thickness. Right ventricular systolic function is normal. The tricuspid regurgitant velocity is 2.26 m/s, and with an assumed right atrial pressure of 3 mmHg, the estimated right ventricular systolic pressure is 09.6 mmHg. Left Atrium: Left atrial size was normal in size. Right Atrium: Right atrial size was normal in size. Pericardium: There is no evidence of pericardial effusion. Mitral Valve: The mitral valve is grossly normal. Trivial mitral valve regurgitation. Tricuspid Valve: The tricuspid valve is grossly normal. Tricuspid valve regurgitation is trivial. Aortic Valve: The aortic valve is tricuspid. Aortic valve regurgitation is not visualized. Aortic valve mean gradient measures 3.0 mmHg. Aortic valve peak gradient measures 5.8 mmHg. Aortic valve area, by VTI measures 2.60 cm. Pulmonic Valve: The pulmonic valve was grossly normal. Pulmonic valve regurgitation is trivial. Aorta: The aortic root and ascending aorta are structurally normal, with no evidence of dilitation. Venous: The inferior vena cava is normal in size with greater than 50% respiratory variability, suggesting right atrial pressure of 3 mmHg. IAS/Shunts: No atrial level shunt detected by  color flow Doppler.  LEFT VENTRICLE PLAX 2D LVIDd:         5.20 cm  Diastology LVIDs:         3.80 cm  LV e' medial:    9.90 cm/s LV PW:         1.30 cm  LV E/e' medial:  7.5 LV IVS:        1.00 cm  LV e' lateral:   10.10 cm/s LVOT diam:     2.20 cm  LV E/e' lateral: 7.4 LV SV:         67 LV SV Index:   32 LVOT Area:     3.80 cm  RIGHT VENTRICLE             IVC RV Basal diam:  2.60 cm     IVC diam: 1.10 cm RV S prime:     11.10 cm/s TAPSE (M-mode): 1.9 cm LEFT ATRIUM  Index       RIGHT ATRIUM           Index LA diam:        2.90 cm 1.39 cm/m  RA Area:     14.60 cm LA Vol (A2C):   49.8 ml 23.81 ml/m RA Volume:   35.90 ml  17.17 ml/m LA Vol (A4C):   37.8 ml 18.07 ml/m LA Biplane Vol: 46.4 ml 22.19 ml/m  AORTIC VALVE AV Area (Vmax):    2.65 cm AV Area (Vmean):   2.80 cm AV Area (VTI):     2.60 cm AV Vmax:           120.00 cm/s AV Vmean:          76.100 cm/s AV VTI:            0.259 m AV Peak Grad:      5.8 mmHg AV Mean Grad:      3.0 mmHg LVOT Vmax:         83.60 cm/s LVOT Vmean:        56.100 cm/s LVOT VTI:          0.177 m LVOT/AV VTI ratio: 0.68  AORTA Ao Root diam: 3.30 cm Ao Asc diam:  2.60 cm MITRAL VALVE               TRICUSPID VALVE MV Area (PHT): 3.21 cm    TR Peak grad:   20.4 mmHg MV Decel Time: 236 msec    TR Vmax:        226.00 cm/s MV E velocity: 74.60 cm/s MV A velocity: 40.70 cm/s  SHUNTS MV E/A ratio:  1.83        Systemic VTI:  0.18 m                            Systemic Diam: 2.20 cm Lyman Bishop MD Electronically signed by Lyman Bishop MD Signature Date/Time: 02/12/2020/6:17:29 PM    Final

## 2020-02-13 NOTE — Progress Notes (Signed)
[facility] Progress Note Patient Name: Belinda [name] DOB: 01-Sep-1980 MRN: [id number]   Date of Service  02/13/2020  HPI/Events of Note  Camera: For Max fent gtt @ 400 max versed gtt @ 12 max precedex gtt @ 1.2 with added oxy IR, ativan, haldol, klonopin!!! Still all over the bed. Nurse saying had been stable on same gtts but dilaudid @ 6 instead of Fent @ 400. Asking if we could switch fent gtt back to dilaudid gtt?  Discussed with RN.  eICU Interventions  - start Propofol, follow HR, TG levels in AM.  Wean fenta/versed=Obese- can linger in adipose tissues.      Intervention Category Major Interventions: Delirium, psychosis, severe agitation - evaluation and management  [name] 02/13/2020, 7:46 PM

## 2020-02-13 NOTE — Progress Notes (Signed)
K 3.4 Electrolytes replaced per protocol 

## 2020-02-13 NOTE — Progress Notes (Signed)
65cc dilaudid gtt wasted in stericycle by this RN witnessed by Meda Klinefelter RN.

## 2020-02-14 ENCOUNTER — Inpatient Hospital Stay (HOSPITAL_COMMUNITY): Payer: Medicaid Other

## 2020-02-14 DIAGNOSIS — J9600 Acute respiratory failure, unspecified whether with hypoxia or hypercapnia: Secondary | ICD-10-CM | POA: Diagnosis not present

## 2020-02-14 DIAGNOSIS — G9341 Metabolic encephalopathy: Secondary | ICD-10-CM | POA: Diagnosis not present

## 2020-02-14 DIAGNOSIS — J90 Pleural effusion, not elsewhere classified: Secondary | ICD-10-CM | POA: Diagnosis not present

## 2020-02-14 DIAGNOSIS — U071 COVID-19: Secondary | ICD-10-CM | POA: Diagnosis not present

## 2020-02-14 DIAGNOSIS — I517 Cardiomegaly: Secondary | ICD-10-CM | POA: Diagnosis not present

## 2020-02-14 DIAGNOSIS — J9601 Acute respiratory failure with hypoxia: Secondary | ICD-10-CM | POA: Diagnosis not present

## 2020-02-14 DIAGNOSIS — J80 Acute respiratory distress syndrome: Secondary | ICD-10-CM | POA: Diagnosis not present

## 2020-02-14 LAB — GLUCOSE, CAPILLARY
Glucose-Capillary: 113 mg/dL — ABNORMAL HIGH (ref 70–99)
Glucose-Capillary: 124 mg/dL — ABNORMAL HIGH (ref 70–99)
Glucose-Capillary: 117 mg/dL — ABNORMAL HIGH (ref 70–99)
Glucose-Capillary: 208 mg/dL — ABNORMAL HIGH (ref 70–99)
Glucose-Capillary: 135 mg/dL — ABNORMAL HIGH (ref 70–99)

## 2020-02-14 LAB — COMPREHENSIVE METABOLIC PANEL
ALT: 56 U/L — ABNORMAL HIGH (ref 0–44)
AST: 44 U/L — ABNORMAL HIGH (ref 15–41)
Albumin: 2.4 g/dL — ABNORMAL LOW (ref 3.5–5.0)
Alkaline Phosphatase: 57 U/L (ref 38–126)
Anion gap: 12 (ref 5–15)
BUN: 15 mg/dL (ref 6–20)
CO2: 28 mmol/L (ref 22–32)
Calcium: 7.8 mg/dL — ABNORMAL LOW (ref 8.9–10.3)
Chloride: 99 mmol/L (ref 98–111)
Creatinine, Ser: 0.63 mg/dL (ref 0.44–1.00)
GFR, Estimated: >60 mL/min (ref >60)
Glucose, Bld: 121 mg/dL — ABNORMAL HIGH (ref 70–99)
Potassium: 3.7 mmol/L (ref 3.5–5.1)
Sodium: 139 mmol/L (ref 135–145)
Total Bilirubin: 0.7 mg/dL (ref 0.3–1.2)
Total Protein: 5.2 g/dL — ABNORMAL LOW (ref 6.5–8.1)

## 2020-02-14 LAB — CBC
HCT: 29.1 % — ABNORMAL LOW (ref 36.0–46.0)
Hemoglobin: 9.8 g/dL — ABNORMAL LOW (ref 12.0–15.0)
MCH: 30.9 pg (ref 26.0–34.0)
MCHC: 33.7 g/dL (ref 30.0–36.0)
MCV: 91.8 fL (ref 80.0–100.0)
Platelets: 306 10*3/uL (ref 150–400)
RBC: 3.17 MIL/uL — ABNORMAL LOW (ref 3.87–5.11)
RDW: 14.1 % (ref 11.5–15.5)
WBC: 10.2 10*3/uL (ref 4.0–10.5)
nRBC: 0.2 % (ref 0.0–0.2)

## 2020-02-14 LAB — PHOSPHORUS: Phosphorus: 4.7 mg/dL — ABNORMAL HIGH (ref 2.5–4.6)

## 2020-02-14 LAB — TRIGLYCERIDES: Triglycerides: 363 mg/dL — ABNORMAL HIGH (ref <150)

## 2020-02-14 LAB — MAGNESIUM: Magnesium: 2.4 mg/dL (ref 1.7–2.4)

## 2020-02-14 MED ORDER — SODIUM CHLORIDE 0.9 % IV SOLN
0.5000 mg/kg/h | INTRAVENOUS | Status: DC
  Administered 2020-02-14 – 2020-02-17 (×9): 0.5 mg/kg/h via INTRAVENOUS
  Filled 2020-02-14 (×14): qty 5

## 2020-02-14 MED ORDER — POTASSIUM CHLORIDE 10 MEQ/50ML IV SOLN
10.0000 meq | INTRAVENOUS | Status: AC
  Administered 2020-02-14 (×2): 10 meq via INTRAVENOUS
  Filled 2020-02-14 (×2): qty 50

## 2020-02-14 MED ORDER — PREDNISONE 20 MG PO TABS
20.0000 mg | ORAL_TABLET | Freq: Every day | ORAL | Status: AC
  Administered 2020-02-15: 20 mg
  Filled 2020-02-14: qty 1

## 2020-02-14 NOTE — Progress Notes (Signed)
NAME:  Belinda [name], MRN:  [id number], DOB:  1980/06/13, LOS: 8 ADMISSION DATE:  02/06/2020, CONSULTATION DATE:  12/16 REFERRING MD:  Josephine Cables ([facility]), CHIEF COMPLAINT:  Acute metabolic encephalopathy and respiratory failure in setting of COVID   BRIEF  39 year old white female, only medical history is obesity.  Presented to [facility] ER 12/15 w/ ARDS and acute metabolic encephalopathy 2/2 COVID  Past Medical History:  Obesity, anxiety, childhood asthma  Significant Hospital Events:  12/15 presented to the emergency room, hypoxic, encephalopathic, Covid positive.CT brain negative for acute injury with only some mild para sinus mucosal thickening with some periodontal disease.  Started on supplemental oxygen, IV Solu-Medrol, IV remdesivir, and baricitinib.  Also started empirically on cefepime  and vancomycin to cover for potential bacterial pneumonia 12/16 multiple reports of worsening confusion, intermittent combativeness, attempts to bite staff.  Not able to take oral medications.  Started on Precedex.  Transferred to [facility]. Changed abx to unasyn, added low dose ativan as pt chronically on xanax.  12/17: Patient more hypoxic, requiring more sedation. Decision made to proceed with intubation.  Intubated, left IJ catheter placed.  PF ratio only 88.  Core track ordered.  Neuromuscular blockade initiated.  Prone protocol initiated.  12/19 - No overnight events but this morning hypertensive and tachycardic. EKG personally reviewed, sinus tachycardia with normal axis. BIS values are low in the 30s. She is still on continuous paralytics.    12/20 -neuromuscular blockade discontinued yesterday.  At this point in time patient is extremely agitated on the ventilator despite Dilaudid infusion Versed infusion, oxycodone schedule, clonopoin scheduloed,  and phenobarb once daily at night.  She is on prednisone and Barcitinib. On vent 50%. AFebrikle  12/21  - 12/21 - cxr horrible. 40% fio2, pulse  ox 90%,.  On dilaudid gtt, oxycdone po, precedex gtt, versed gt and klonopin,  On phenobarb QHS x 2 night.  On haldol schedulesd x 1 dayt.   -. RASS -4 but easily goes to +2 per RN. On TF.  K 2.9 while on 37mkcl daily. + 4L volume overloa  12/22 - 40% fio2. On dilaudid gtt, [precedex gtt, versed gtt. On oxy and klonopin. Still very agitated On TF. -2L since admit  Consults:  Not applicable  Procedures:  Intubation 12/17 Left IJ triple-lumen catheter 12/17   Significant Diagnostic Tests:  CT brain 12/15: Negative for acute CVA or acute process  Micro Data:  RSA PCR 12/15 urine culture>> 80,000 colonies GNR ->Klebsiella (resistant to ampi but sensitive to unasyn) Blood culture 12/15>>> Respiratory panel by RT-PCR 12/15: Positive for Covid   Antimicrobials:  Remdesivir 12/15 Cefepime 12/15-->12/16 Vancomycin 12/15-->12/16 unasyn 12/16 (PCT 25, klbe uti) >>>12/20 xxx covid Rx  - barcitinib - endds - prednisone - ends 12/24  Interim History / Subjective:    12/23 -> stil with significant intermittent agitation. On 30% fio2, Now on diprivan gtt, precedex gtt, fent gtt, versed gtt, levophed gtt. On vent, 30% fio2  Objective   Blood pressure 133/90, pulse (!) 103, temperature 97.7 F (36.5 C), temperature source Axillary, resp. rate 19, height 5' 3"  (1.6 m), weight 103.8 kg, SpO2 98 %.    Vent Mode: PRVC FiO2 (%):  [30 %-50 %] 30 % Set Rate:  [28 bmp] 28 bmp Vt Set:  [320 mL] 320 mL PEEP:  [5 cmH20] 5 cmH20 Plateau Pressure:  [17 cmH20-19 cmH20] 18 cmH20   Intake/Output Summary (Last 24 hours) at 02/14/2020 0934 Last data filed at 02/14/2020 0900 Gross per  24 hour  Intake 3068.99 ml  Output 7565 ml  Net -4496.01 ml   Filed Weights   02/11/20 0500 02/12/20 0600 02/14/20 0352  Weight: 108.2 kg 109.1 kg 103.8 kg   General Appearance:  Looks criticall ill OBESE - + Head:  Normocephalic, without obvious abnormality, atraumatic Eyes:  PERRL - yes, conjunctiva/corneas -  muddy     Ears:  Normal external ear canals, both ears Nose:  G tube - yes Throat:  ETT TUBE - yes , OG tube - no Neck:  Supple,  No enlargement/tenderness/nodules Lungs: Clear to auscultation bilaterally, Ventilator   Synchrony - yes 30% fio2 Heart:  S1 and S2 normal, no murmur, CVP - no.  Pressors - no Abdomen:  Soft, no masses, no organomegaly Genitalia / Rectal:  Not done Extremities:  Extremities- intact Skin:  ntact in exposed areas . Sacral area - intact Neurologic:  Sedation - multiple -> RASS - -4 to +3 . Moves all 4s - yes. CAM-ICU - positive . Orientation - not      Resolved Hospital Problem list     Klebsiella urinary tract infection with sepsis syndrome with or without aspiration pneumonia - unasyn ending 02/11/20  -02/01/2020: Afebrile since 12/16. Off Unasyn since 12/20  Plan -clinically monitor  Assessment & Plan:  Acute hypoxic respiratory failure in setting of Covid pneumonia, and ARDS, plus minus CAP versus aspiration - off nimbex 12/19   02/14/2020 - > does not meet criteria for SBT/Extubation in setting of Acute Respiratory Failure due to acute agitated encephalopathy   Plan PRVC VAP bundle Might be heading to trach if encephalopathy does not clear in next 5-10 days   COVID-19 s/p remedsivir  12./21 - d-dimer down to 1.78 from 2s.   plan  day 9 steropids - reduced to 46m per day on 02/11/20 reduce to 269m12/23 -> wil end 02/15/20 Day 7 barcitinb Track d-dimer, crp clinically prn    Chronic xanax gtt Acute metabolic encephalopathy in the setting of sepsis and Covid infection.  Chronically Xanax dependent   02/14/2020 -significant intermittent agitation despite fent (was on Dilaudid tghoguh 12/22)  infusion, oxycodone schedule, Versed infusion and Klonopin schedule, , precedex gtt and diprivan gtt haldol scheduled.  Qtc 460 msc per RN on monitor   Plan PAD protocol  fentanyl gtt, Versed gtt Continue oxycodone and Klonopin continue  Precedex infusion Continue diprivan gtt Cotninue cheduled Haldol with QTC monitoring  Add ketamine - wean off diprivan or precedex Reduce prednisone   Fluid and electrolyte imbalance: Hypokalemia severe  - K 3s  Plan Replace recheck Continue to monitor daily   Volume overload  02/14/2020 --4.5L negative since admit  With help of lasix ECHO normal 02/12/20. Off pressors  Plan  - pause diuresis  Mild elevated LFTs - transamints  02/14/2020 - improved  Plan monitor  Anemia of critical illness  02/14/2020 - no bleeding   Plan - PRBC for hgb </= 6.9gm%    - exceptions are   -  if ACS susepcted/confirmed then transfuse for hgb </= 8.0gm%,  or    -  active bleeding with hemodynamic instability, then transfuse regardless of hemoglobin value   At at all times try to transfuse 1 unit prbc as possible with exception of active hemorrhage     Hyperglycemia Plan Resistant SSI Increase TF coverage and add long acting     Best practice (evaluated daily)  Diet: tube feeds advancing to goal Pain/Anxiety/Delirium protocol (if indicated):see above VAP protocol (if indicated):  12/17 DVT prophylaxis: Lovenox GI prophylaxis: PPI Glucose control: Sliding scale insulin  Mobility: Bedrest currently requiring restraints Disposition: Intensive care Family:  DAd Stollings [phone] -> updated 02/11/20 and 02/12/20 and 02/13/20 and 02/14/20 -> dad indicated that son Keenan Bachelor and Sellers and    BF Lennette Bihari, and Dad Donnie and [place] mom love her and to tell patient thhis-> he feels this wil help her.   Goals of Care:  Last date of multidisciplinary goals of care discussion: via phone so not able to do multidisciplinary meeting  Family and staff present: NP and mother dianne  Summary of discussion: Full code  Follow up goals of care discussion due: Goals of care discussion due 12/23   Code Status: Full code      Lilly   The patient Belinda [name]  is critically ill with multiple organ systems failure and requires high complexity decision making for assessment and support, frequent evaluation and titration of therapies, application of advanced monitoring technologies and extensive interpretation of multiple databases.   Critical Care Time devoted to patient care services described in this note is  45  Minutes. This time reflects time of care of this signee Dr Brand Males. This critical care time does not reflect procedure time, or teaching time or supervisory time of PA/NP/Med student/Med Resident etc but could involve care discussion time     Dr. Brand Males, M.D., [facility].C.P Pulmonary and Critical Care Medicine Staff Physician [facility] Pager: [phone], If no answer or between  15:00h - 7:00h: call [phone]  02/14/2020 9:46 AM    LABS    PULMONARY Recent Labs  Lab 02/08/20 1540 02/09/20 0454 02/09/20 0806 02/09/20 1414 02/10/20 0435 02/11/20 0456 02/12/20 0522  PHART 7.301* 7.345* 7.333* 7.356 7.392 7.376 7.435  PCO2ART 58.4* 52.5* 50.7* 48.8* 44.2 51.6* 47.7  PO2ART 149* 98 79* 82* 89.6 119* 72*  HCO3 28.5* 28.8* 27.2 27.5 26.5 29.6* 32.0*  TCO2 30 30 29 29   --   --  33*  O2SAT 99.0 97.0 95.0 96.0 96.7 97.8 94.0    CBC Recent Labs  Lab 02/12/20 0332 02/12/20 0522 02/13/20 0251 02/14/20 0325  HGB 9.9* 8.8* 10.3* 9.8*  HCT 29.6* 26.0* 31.8* 29.1*  WBC 9.5  --  10.2 10.2  PLT 255  --  271 306    COAGULATION No results for input(s): INR in the last 168 hours.  CARDIAC  No results for input(s): TROPONINI in the last 168 hours. No results for input(s): PROBNP in the last 168 hours.   CHEMISTRY Recent Labs  Lab 02/09/20 0423 02/09/20 0454 02/09/20 2230 02/10/20 0350 02/11/20 0455 02/12/20 0332 02/12/20 0522 02/13/20 0251 02/14/20 0325  NA 143   < >  --  145 148* 148* 147* 143 139  K 3.8   < >  --  3.5 3.0* 3.2* 2.9* 3.4* 3.7  CL  109  --   --  110 112* 111  --  101 99  CO2 23  --   --  24 28 28   --  30 28  GLUCOSE 239*  --   --  252* 129* 149*  --  160* 121*  BUN 15  --   --  20 20 19   --  19 15  CREATININE 0.74  --   --  0.64 0.65 0.56  --  0.62 0.63  CALCIUM 7.5*  --   --  7.6* 7.6* 7.5*  --  7.9* 7.8*  MG 2.6*  --  2.6*  --   --  2.3  --  2.2 2.4  PHOS 2.7  --  1.5*  --   --  3.2  --  3.9 4.7*   < > = values in this interval not displayed.   Estimated Creatinine Clearance: 109.9 mL/min (by C-G formula based on SCr of 0.63 mg/dL).   LIVER Recent Labs  Lab 02/10/20 0350 02/11/20 0455 02/12/20 0332 02/13/20 0251 02/14/20 0325  AST 75* 49* 50* 40 44*  ALT 92* 81* 74* 66* 56*  ALKPHOS 69 51 56 60 57  BILITOT 0.4 0.7 0.6 0.6 0.7  PROT 5.1* 5.1* 5.1* 5.6* 5.2*  ALBUMIN 2.2* 2.3* 2.2* 2.5* 2.4*     INFECTIOUS Recent Labs  Lab 02/13/20 1258  LATICACIDVEN 1.9     ENDOCRINE CBG (last 3)  Recent Labs    02/13/20 2332 02/14/20 0340 02/14/20 0805  GLUCAP 101* 113* 124*         IMAGING x48h  - image(s) personally visualized  -   highlighted in bold DG CHEST PORT 1 VIEW  Result Date: 02/14/2020 CLINICAL DATA:  ETT EXAM: PORTABLE CHEST 1 VIEW COMPARISON:  Radiograph 02/13/2020 FINDINGS: *Endotracheal tube tip low in the trachea, 2 cm from the carina. Consider retraction 1-2 cm to the mid trachea. *Transesophageal tube tip terminates near the region of the gastric antrum/duodenal bulb. *Left upper IJ approach central venous catheter tip terminates near the left brachiocephalic-caval confluence. Persistent diffuse heterogeneous opacities throughout both lungs in a mid to lower lung predominance, not significantly changed from 1 day prior. Cardiomegaly is stable. Indistinct vascularity. No pneumothorax. Obscuration of the hemidiaphragms may suggest some layering effusion. No acute osseous or soft tissue abnormality. Telemetry leads overlie the chest. IMPRESSION: 1. Endotracheal tube tip low in the  trachea, 2 cm from the carina. Consider retraction 1-2 cm to the mid trachea. 2. Stable diffuse heterogeneous opacities throughout both lungs in a mid to lower lung predominance. Could reflect a combination of edema and infection with likely layering bilateral effusions. These results will be called to the ordering clinician or representative by the Radiologist Assistant, and communication documented in the PACS or [employer]. Electronically Signed   By: Lovena Le M.D.   On: 02/14/2020 05:39   DG CHEST PORT 1 VIEW  Result Date: 02/13/2020 CLINICAL DATA:  Hypoxia EXAM: PORTABLE CHEST 1 VIEW COMPARISON:  February 12, 2020 FINDINGS: Endotracheal tube tip is 1.7 cm above the carina. Enteric tube tip is below the diaphragm. Central catheter tip is in the left innominate vein near the junction with the superior vena cava. No pneumothorax. There is airspace opacity in both mid and lower lung regions with small pleural effusions bilaterally. Heart is enlarged with pulmonary vascularity normal, stable. No adenopathy. No bone lesions. IMPRESSION: Tube and catheter positions as described without pneumothorax. Persistent airspace opacity in the mid and lower lung regions with bilateral pleural effusions. Cardiomegaly. Overall appearance raises concern for a degree of congestive heart failure. There may well be superimposed pneumonia in the lung bases. Both edema and pneumonia may present concurrently. Electronically Signed   By: Lowella Grip III M.D.   On: 02/13/2020 08:03   ECHOCARDIOGRAM COMPLETE  Result Date: 02/12/2020    ECHOCARDIOGRAM REPORT   Patient Name:   Belinda [name] Date of Exam: 02/12/2020 Medical Rec #:  [id number]        Height:  63.0 in Accession #:    [id number]       Weight:       240.5 lb Date of Birth:  [birth year]        BSA:          2.091 m Patient Age:    38 years         BP:           99/70 mmHg Patient Gender: F                HR:           54 bpm. Exam Location:   Inpatient Procedure: 2D Echo, Cardiac Doppler and Color Doppler Indications:    Acute respiratory distress R06.03  History:        Patient has no prior history of Echocardiogram examinations.  Sonographer:    Clayton Lefort RDCS (AE) Referring Phys: [id number] [facility]  Sonographer Comments: Image acquisition challenging due to patient body habitus. IMPRESSIONS  1. Left ventricular ejection fraction, by estimation, is 55 to 60%. The left ventricle has normal function. The left ventricle has no regional wall motion abnormalities. Left ventricular diastolic parameters were normal.  2. Right ventricular systolic function is normal. The right ventricular size is normal. The estimated right ventricular systolic pressure is 27.7 mmHg.  3. The mitral valve is grossly normal. Trivial mitral valve regurgitation.  4. The aortic valve is tricuspid. Aortic valve regurgitation is not visualized.  5. The inferior vena cava is normal in size with greater than 50% respiratory variability, suggesting right atrial pressure of 3 mmHg. Conclusion(s)/Recommendation(s): Normal biventricular function without evidence of hemodynamically significant valvular heart disease. FINDINGS  Left Ventricle: Left ventricular ejection fraction, by estimation, is 55 to 60%. The left ventricle has normal function. The left ventricle has no regional wall motion abnormalities. The left ventricular internal cavity size was normal in size. There is  no left ventricular hypertrophy. Left ventricular diastolic parameters were normal. Right Ventricle: The right ventricular size is normal. No increase in right ventricular wall thickness. Right ventricular systolic function is normal. The tricuspid regurgitant velocity is 2.26 m/s, and with an assumed right atrial pressure of 3 mmHg, the estimated right ventricular systolic pressure is 41.2 mmHg. Left Atrium: Left atrial size was normal in size. Right Atrium: Right atrial size was normal in size. Pericardium:  There is no evidence of pericardial effusion. Mitral Valve: The mitral valve is grossly normal. Trivial mitral valve regurgitation. Tricuspid Valve: The tricuspid valve is grossly normal. Tricuspid valve regurgitation is trivial. Aortic Valve: The aortic valve is tricuspid. Aortic valve regurgitation is not visualized. Aortic valve mean gradient measures 3.0 mmHg. Aortic valve peak gradient measures 5.8 mmHg. Aortic valve area, by VTI measures 2.60 cm. Pulmonic Valve: The pulmonic valve was grossly normal. Pulmonic valve regurgitation is trivial. Aorta: The aortic root and ascending aorta are structurally normal, with no evidence of dilitation. Venous: The inferior vena cava is normal in size with greater than 50% respiratory variability, suggesting right atrial pressure of 3 mmHg. IAS/Shunts: No atrial level shunt detected by color flow Doppler.  LEFT VENTRICLE PLAX 2D LVIDd:         5.20 cm  Diastology LVIDs:         3.80 cm  LV e' medial:    9.90 cm/s LV PW:         1.30 cm  LV E/e' medial:  7.5 LV IVS:        1.00 cm  LV e' lateral:   10.10 cm/s LVOT diam:     2.20 cm  LV E/e' lateral: 7.4 LV SV:         67 LV SV Index:   32 LVOT Area:     3.80 cm  RIGHT VENTRICLE             IVC RV Basal diam:  2.60 cm     IVC diam: 1.10 cm RV S prime:     11.10 cm/s TAPSE (M-mode): 1.9 cm LEFT ATRIUM             Index       RIGHT ATRIUM           Index LA diam:        2.90 cm 1.39 cm/m  RA Area:     14.60 cm LA Vol (A2C):   49.8 ml 23.81 ml/m RA Volume:   35.90 ml  17.17 ml/m LA Vol (A4C):   37.8 ml 18.07 ml/m LA Biplane Vol: 46.4 ml 22.19 ml/m  AORTIC VALVE AV Area (Vmax):    2.65 cm AV Area (Vmean):   2.80 cm AV Area (VTI):     2.60 cm AV Vmax:           120.00 cm/s AV Vmean:          76.100 cm/s AV VTI:            0.259 m AV Peak Grad:      5.8 mmHg AV Mean Grad:      3.0 mmHg LVOT Vmax:         83.60 cm/s LVOT Vmean:        56.100 cm/s LVOT VTI:          0.177 m LVOT/AV VTI ratio: 0.68  AORTA Ao Root diam: 3.30  cm Ao Asc diam:  2.60 cm MITRAL VALVE               TRICUSPID VALVE MV Area (PHT): 3.21 cm    TR Peak grad:   20.4 mmHg MV Decel Time: 236 msec    TR Vmax:        226.00 cm/s MV E velocity: 74.60 cm/s MV A velocity: 40.70 cm/s  SHUNTS MV E/A ratio:  1.83        Systemic VTI:  0.18 m                            Systemic Diam: 2.20 cm Lyman Bishop MD Electronically signed by Lyman Bishop MD Signature Date/Time: 02/12/2020/6:17:29 PM    Final

## 2020-02-14 NOTE — Progress Notes (Signed)
Pt RASS +3 and asynchronous with the ventilator. ST, increase BP, and sats in low 90s. Pt on maximum sedation and prn ativan given with no improvements. Notified Elink. Per Dr. Prudencio Burly will order propofol drip.

## 2020-02-14 NOTE — Plan of Care (Signed)
  Problem: Nutrition: Goal: Adequate nutrition will be maintained Outcome: Progressing   Problem: Elimination: Goal: Will not experience complications related to bowel motility Outcome: Progressing   

## 2020-02-15 ENCOUNTER — Inpatient Hospital Stay (HOSPITAL_COMMUNITY): Payer: Medicaid Other

## 2020-02-15 DIAGNOSIS — J80 Acute respiratory distress syndrome: Secondary | ICD-10-CM | POA: Diagnosis not present

## 2020-02-15 DIAGNOSIS — U071 COVID-19: Secondary | ICD-10-CM | POA: Diagnosis not present

## 2020-02-15 DIAGNOSIS — G9341 Metabolic encephalopathy: Secondary | ICD-10-CM | POA: Diagnosis not present

## 2020-02-15 DIAGNOSIS — J9601 Acute respiratory failure with hypoxia: Secondary | ICD-10-CM | POA: Diagnosis not present

## 2020-02-15 DIAGNOSIS — J9600 Acute respiratory failure, unspecified whether with hypoxia or hypercapnia: Secondary | ICD-10-CM | POA: Diagnosis not present

## 2020-02-15 LAB — BLOOD GAS, ARTERIAL
Acid-Base Excess: 4.4 mmol/L — ABNORMAL HIGH (ref 0.0–2.0)
Bicarbonate: 28.3 mmol/L — ABNORMAL HIGH (ref 20.0–28.0)
Drawn by: 60057
FIO2: 30
O2 Saturation: 87.6 %
Patient temperature: 37
pCO2 arterial: 42.1 mmHg (ref 32.0–48.0)
pH, Arterial: 7.444 (ref 7.350–7.450)
pO2, Arterial: 58.3 mmHg — ABNORMAL LOW (ref 83.0–108.0)

## 2020-02-15 LAB — COMPREHENSIVE METABOLIC PANEL
ALT: 48 U/L — ABNORMAL HIGH (ref 0–44)
AST: 31 U/L (ref 15–41)
Albumin: 2.4 g/dL — ABNORMAL LOW (ref 3.5–5.0)
Alkaline Phosphatase: 50 U/L (ref 38–126)
Anion gap: 9 (ref 5–15)
BUN: 15 mg/dL (ref 6–20)
CO2: 27 mmol/L (ref 22–32)
Calcium: 8 mg/dL — ABNORMAL LOW (ref 8.9–10.3)
Chloride: 100 mmol/L (ref 98–111)
Creatinine, Ser: 0.52 mg/dL (ref 0.44–1.00)
GFR, Estimated: >60 mL/min (ref >60)
Glucose, Bld: 93 mg/dL (ref 70–99)
Potassium: 4.2 mmol/L (ref 3.5–5.1)
Sodium: 136 mmol/L (ref 135–145)
Total Bilirubin: 0.5 mg/dL (ref 0.3–1.2)
Total Protein: 5.6 g/dL — ABNORMAL LOW (ref 6.5–8.1)

## 2020-02-15 LAB — CBC
HCT: 33.1 % — ABNORMAL LOW (ref 36.0–46.0)
Hemoglobin: 10.8 g/dL — ABNORMAL LOW (ref 12.0–15.0)
MCH: 30.6 pg (ref 26.0–34.0)
MCHC: 32.6 g/dL (ref 30.0–36.0)
MCV: 93.8 fL (ref 80.0–100.0)
Platelets: 315 10*3/uL (ref 150–400)
RBC: 3.53 MIL/uL — ABNORMAL LOW (ref 3.87–5.11)
RDW: 13.7 % (ref 11.5–15.5)
WBC: 9.1 10*3/uL (ref 4.0–10.5)
nRBC: 0.2 % (ref 0.0–0.2)

## 2020-02-15 LAB — GLUCOSE, CAPILLARY
Glucose-Capillary: 111 mg/dL — ABNORMAL HIGH (ref 70–99)
Glucose-Capillary: 129 mg/dL — ABNORMAL HIGH (ref 70–99)
Glucose-Capillary: 148 mg/dL — ABNORMAL HIGH (ref 70–99)
Glucose-Capillary: 163 mg/dL — ABNORMAL HIGH (ref 70–99)
Glucose-Capillary: 71 mg/dL (ref 70–99)
Glucose-Capillary: 90 mg/dL (ref 70–99)
Glucose-Capillary: 93 mg/dL (ref 70–99)

## 2020-02-15 LAB — CK TOTAL AND CKMB (NOT AT ARMC)
CK, MB: 1.4 ng/mL (ref 0.5–5.0)
Relative Index: 0.6 (ref 0.0–2.5)
Total CK: 253 U/L — ABNORMAL HIGH (ref 38–234)

## 2020-02-15 LAB — TRIGLYCERIDES: Triglycerides: 260 mg/dL — ABNORMAL HIGH (ref <150)

## 2020-02-15 LAB — MAGNESIUM: Magnesium: 2.4 mg/dL (ref 1.7–2.4)

## 2020-02-15 LAB — PHOSPHORUS: Phosphorus: 5 mg/dL — ABNORMAL HIGH (ref 2.5–4.6)

## 2020-02-15 LAB — LACTIC ACID, PLASMA: Lactic Acid, Venous: 0.7 mmol/L (ref 0.5–1.9)

## 2020-02-15 MED ORDER — LACTATED RINGERS IV BOLUS
1000.0000 mL | Freq: Once | INTRAVENOUS | Status: AC
  Administered 2020-02-15: 1000 mL via INTRAVENOUS

## 2020-02-15 NOTE — Plan of Care (Signed)
  Problem: Clinical Measurements: Goal: Respiratory complications will improve Outcome: Progressing   

## 2020-02-15 NOTE — Progress Notes (Signed)
NAME:  Belinda [name], MRN:  [id number], DOB:  [birth year], LOS: 9 ADMISSION DATE:  02/06/2020, CONSULTATION DATE:  12/16 REFERRING MD:  Josephine Cables ([facility]), CHIEF COMPLAINT:  Acute metabolic encephalopathy and respiratory failure in setting of COVID   BRIEF  39 year old white female, only medical history is obesity.  Presented to [facility] ER 12/15 w/ ARDS and acute metabolic encephalopathy 2/2 COVID  Past Medical History:  Obesity, anxiety, childhood asthma  Significant Hospital Events:  12/15 presented to the emergency room, hypoxic, encephalopathic, Covid positive.CT brain negative for acute injury with only some mild para sinus mucosal thickening with some periodontal disease.  Started on supplemental oxygen, IV Solu-Medrol, IV remdesivir, and baricitinib.  Also started empirically on cefepime  and vancomycin to cover for potential bacterial pneumonia 12/16 multiple reports of worsening confusion, intermittent combativeness, attempts to bite staff.  Not able to take oral medications.  Started on Precedex.  Transferred to [facility]. Changed abx to unasyn, added low dose ativan as pt chronically on xanax.  12/17: Patient more hypoxic, requiring more sedation. Decision made to proceed with intubation.  Intubated, left IJ catheter placed.  PF ratio only 88.  Core track ordered.  Neuromuscular blockade initiated.  Prone protocol initiated.  12/19 - No overnight events but this morning hypertensive and tachycardic. EKG personally reviewed, sinus tachycardia with normal axis. BIS values are low in the 30s. She is still on continuous paralytics.    12/20 -neuromuscular blockade discontinued yesterday.  At this point in time patient is extremely agitated on the ventilator despite Dilaudid infusion Versed infusion, oxycodone schedule, clonopoin scheduloed,  and phenobarb once daily at night.  She is on prednisone and Barcitinib. On vent 50%. AFebrikle  12/21  - 12/21 - cxr horrible. 40% fio2, pulse  ox 90%,.  On dilaudid gtt, oxycdone po, precedex gtt, versed gt and klonopin,  On phenobarb QHS x 2 night.  On haldol schedulesd x 1 dayt.   -. RASS -4 but easily goes to +2 per RN. On TF.  K 2.9 while on 42mkcl daily. + 4L volume overloa  12/22 - 40% fio2. On dilaudid gtt, [precedex gtt, versed gtt. On oxy and klonopin. Still very agitated On TF. -2L since admit   12/23 -> stil with significant intermittent agitation. On 30% fio2, Now on diprivan gtt, precedex gtt, fent gtt, versed gtt, levophed gtt. On vent, 30% fio2  Consults:  Not applicable  Procedures:  Intubation 12/17 Left IJ triple-lumen catheter 12/17   Significant Diagnostic Tests:  CT brain 12/15: Negative for acute CVA or acute process  Micro Data:  RSA PCR 12/15 urine culture>> 80,000 colonies GNR ->Klebsiella (resistant to ampi but sensitive to unasyn) Blood culture 12/15>>> Respiratory panel by RT-PCR 12/15: Positive for Covid   Antimicrobials:  Remdesivir 12/15 Cefepime 12/15-->12/16 Vancomycin 12/15-->12/16 unasyn 12/16 (PCT 25, klbe uti) >>>12/20 xxx covid Rx  - barcitinib - endds 12/30 - prednisone - ends 12/24  Interim History / Subjective:    12.24 - ? Low grade fever. 30% fio2 on vent. On fent gtt, prededex gtt, ketamine gtt, versed gtt . Also on levophed gtt. Some breakthrough agitation per RN but much better overall  Objective   Blood pressure 101/80, pulse 85, temperature 99.2 F (37.3 C), temperature source Oral, resp. rate 19, height 5' 3"  (1.6 m), weight 104.1 kg, SpO2 97 %.    Vent Mode: PRVC FiO2 (%):  [30 %-60 %] 30 % Set Rate:  [15 bmp] 15 bmp Vt Set:  [320  mL] 320 mL PEEP:  [5 cmH20] 5 cmH20 Pressure Support:  [5 cmH20] 5 cmH20   Intake/Output Summary (Last 24 hours) at 02/15/2020 0843 Last data filed at 02/15/2020 0700 Gross per 24 hour  Intake 3819.46 ml  Output 7150 ml  Net -3330.54 ml   Filed Weights   02/12/20 0600 02/14/20 0352 02/15/20 0500  Weight: 109.1 kg 103.8  kg 104.1 kg   General Appearance:  Looks criticall ill OBESE - + Head:  Normocephalic, without obvious abnormality, atraumatic Eyes:  PERRL - yes, conjunctiva/corneas - muddy     Ears:  Normal external ear canals, both ears Nose:  G tube - yes Throat:  ETT TUBE - yes , OG tube - no Neck:  Supple,  No enlargement/tenderness/nodules Lungs: Clear to auscultation bilaterally, Ventilator   Synchrony - yes 30% fio2 Heart:  S1 and S2 normal, no murmur, CVP - no.  Pressors - no Abdomen:  Soft, no masses, no organomegaly Genitalia / Rectal:  Not done Extremities:  Extremities- intact Skin:  ntact in exposed areas . Sacral area - intact Neurologic:  Sedation - multiple -> RASS - -4  currently. Moves all 4s - yes. CAM-ICU - positive . Orientation - not    Resolved Hospital Problem list     Klebsiella urinary tract infection with sepsis syndrome with or without aspiration pneumonia - unasyn ending 02/11/20  -02/01/2020: Afebrile since 12/16. Off Unasyn since 12/20  Plan -clinically monitor   Mild elevated LFTs - transamints  02/15/2020 -resolved  Plan monitor  Assessment & Plan:  Acute hypoxic respiratory failure in setting of Covid pneumonia, and ARDS, plus minus CAP versus aspiration - off nimbex 12/19   02/15/2020 - >doing SBT but cannot extubate due to agitated encephaloapthy   Plan PRVC VAP bundle Might be heading to trach if encephalopathy does not clear in next 5-10 days   COVID-19 s/p remedsivir  12./21 - d-dimer down to 1.78 from 2s.   plan  day 9 steropids - reduced to 35m per day on 02/11/20 reduce to 256m12/23 -> wil end 02/15/20 Day 8 barcitinb Track d-dimer, 02/16/20   Chronic xanax gtt Acute metabolic encephalopathy in the setting of sepsis and Covid infection.  Chronically Xanax dependent   02/15/2020  Somewhat improveed agitation on precedex gtt, ketamne gtt, fent gtt, versed gtt + scheduled oxycodone + shceduled klonopn and haldol  Plan PAD  protocol  fentanyl gtt, Versed gtt Continue oxycodone and Klonopin continue Precedex infusion Continue ketaming tt Cotninue cheduled Haldol with QTC monitoring    Give fent gtt holiday 12/24 for wua     Volume overload  02/15/2020 --4.5L negative since admit  With help of lasix ECHO normal 02/12/20. Off pressors. Diuresis paused 02/14/20  Plan  - pause diuresis and monitor   Anemia of critical illness  02/15/2020 - no bleeding   Plan - PRBC for hgb </= 6.9gm%    - exceptions are   -  if ACS susepcted/confirmed then transfuse for hgb </= 8.0gm%,  or    -  active bleeding with hemodynamic instability, then transfuse regardless of hemoglobin value   At at all times try to transfuse 1 unit prbc as possible with exception of active hemorrhage     Hyperglycemia Plan Resistant SSI Increase TF coverage and add long acting     Best practice (evaluated daily)  Diet: tube feeds advancing to goal Pain/Anxiety/Delirium protocol (if indicated):see above VAP protocol (if indicated): 12/17 DVT prophylaxis: Lovenox GI prophylaxis: PPI  Glucose  control: Sliding scale insulin   Mobility: Bedrest currently requiring restraints  Disposition: Intensive care  Family:  DAd Donnie Hollinghead [phone] ->   updated 02/11/20 and 02/12/20 and 02/13/20   Updated 02/14/20 -> dad indicated that son Keenan Bachelor and [place] and    BF Lennette Bihari, and Dad Donnie and Levander Campion mom love her and to tell patient thhis-> he feels this wil help her.   Updated 02/15/20 -> dad updated. Dad thinks the panda bear next to her is helping  Goals of Care:  Last date of multidisciplinary goals of care discussion: via phone so not able to do multidisciplinary meeting  Family and staff present: NP and mother dianne  Summary of discussion: Full code  Follow up goals of care discussion due: Goals of care discussion due 12/23   Code Status: Full code      [place]   The patient Belinda [name] is critically ill with multiple organ systems failure and requires high complexity decision making for assessment and support, frequent evaluation and titration of therapies, application of advanced monitoring technologies and extensive interpretation of multiple databases.   Critical Care Time devoted to patient care services described in this note is  40  Minutes. This time reflects time of care of this signee Dr Brand Males. This critical care time does not reflect procedure time, or teaching time or supervisory time of PA/NP/Med student/Med Resident etc but could involve care discussion time     Dr. Brand Males, M.D., [facility].C.P Pulmonary and Critical Care Medicine Staff Physician [place] [facility] Pager: [phone], If no answer or between  15:00h - 7:00h: call [phone]  02/15/2020 9:12 AM    LABS    PULMONARY Recent Labs  Lab 02/08/20 1540 02/09/20 0454 02/09/20 0806 02/09/20 1414 02/10/20 0435 02/11/20 0456 02/12/20 0522 02/15/20 0459  PHART 7.301* 7.345* 7.333* 7.356 7.392 7.376 7.435 7.444  PCO2ART 58.4* 52.5* 50.7* 48.8* 44.2 51.6* 47.7 42.1  PO2ART 149* 98 79* 82* 89.6 119* 72* 58.3*  HCO3 28.5* 28.8* 27.2 27.5 26.5 29.6* 32.0* 28.3*  TCO2 30 30 29 29   --   --  33*  --   O2SAT 99.0 97.0 95.0 96.0 96.7 97.8 94.0 87.6    CBC Recent Labs  Lab 02/13/20 0251 02/14/20 0325 02/15/20 0325  HGB 10.3* 9.8* 10.8*  HCT 31.8* 29.1* 33.1*  WBC 10.2 10.2 9.1  PLT 271 306 315    COAGULATION No results for input(s): INR in the last 168 hours.  CARDIAC  No results for input(s): TROPONINI in the last 168 hours. No results for input(s): PROBNP in the last 168 hours.   CHEMISTRY Recent Labs  Lab 02/09/20 2230 02/10/20 0350 02/11/20 0455 02/12/20 0332 02/12/20 0522 02/13/20 0251 02/14/20 0325 02/15/20 0435  NA  --    < > 148* 148* 147* 143 139 136  K  --    < > 3.0* 3.2* 2.9* 3.4* 3.7 4.2  CL  --     < > 112* 111  --  101 99 100  CO2  --    < > 28 28  --  30 28 27   GLUCOSE  --    < > 129* 149*  --  160* 121* 93  BUN  --    < > 20 19  --  19 15 15   CREATININE  --    < > 0.65 0.56  --  0.62 0.63 0.52  CALCIUM  --    < > 7.6* 7.5*  --  7.9* 7.8* 8.0*  MG 2.6*  --   --  2.3  --  2.2 2.4 2.4  PHOS 1.5*  --   --  3.2  --  3.9 4.7* 5.0*   < > = values in this interval not displayed.   Estimated Creatinine Clearance: 110 mL/min (by C-G formula based on SCr of 0.52 mg/dL).   LIVER Recent Labs  Lab 02/11/20 0455 02/12/20 0332 02/13/20 0251 02/14/20 0325 02/15/20 0435  AST 49* 50* 40 44* 31  ALT 81* 74* 66* 56* 48*  ALKPHOS 51 56 60 57 50  BILITOT 0.7 0.6 0.6 0.7 0.5  PROT 5.1* 5.1* 5.6* 5.2* 5.6*  ALBUMIN 2.3* 2.2* 2.5* 2.4* 2.4*     INFECTIOUS Recent Labs  Lab 02/13/20 1258 02/15/20 0325  LATICACIDVEN 1.9 0.7     ENDOCRINE CBG (last 3)  Recent Labs    02/15/20 0002 02/15/20 0319 02/15/20 0822  GLUCAP 93 71 111*         IMAGING x48h  - image(s) personally visualized  -   highlighted in bold DG CHEST PORT 1 VIEW  Result Date: 02/14/2020 CLINICAL DATA:  ETT EXAM: PORTABLE CHEST 1 VIEW COMPARISON:  Radiograph 02/13/2020 FINDINGS: *Endotracheal tube tip low in the trachea, 2 cm from the carina. Consider retraction 1-2 cm to the mid trachea. *Transesophageal tube tip terminates near the region of the gastric antrum/duodenal bulb. *Left upper IJ approach central venous catheter tip terminates near the left brachiocephalic-caval confluence. Persistent diffuse heterogeneous opacities throughout both lungs in a mid to lower lung predominance, not significantly changed from 1 day prior. Cardiomegaly is stable. Indistinct vascularity. No pneumothorax. Obscuration of the hemidiaphragms may suggest some layering effusion. No acute osseous or soft tissue abnormality. Telemetry leads overlie the chest. IMPRESSION: 1. Endotracheal tube tip low in the trachea, 2 cm from the  carina. Consider retraction 1-2 cm to the mid trachea. 2. Stable diffuse heterogeneous opacities throughout both lungs in a mid to lower lung predominance. Could reflect a combination of edema and infection with likely layering bilateral effusions. These results will be called to the ordering clinician or representative by the Radiologist Assistant, and communication documented in the PACS or [employer]. Electronically Signed   By: Lovena Le M.D.   On: 02/14/2020 05:39

## 2020-02-16 ENCOUNTER — Inpatient Hospital Stay (HOSPITAL_COMMUNITY): Payer: Medicaid Other

## 2020-02-16 DIAGNOSIS — U071 COVID-19: Secondary | ICD-10-CM | POA: Diagnosis not present

## 2020-02-16 DIAGNOSIS — G9341 Metabolic encephalopathy: Secondary | ICD-10-CM | POA: Diagnosis not present

## 2020-02-16 DIAGNOSIS — J80 Acute respiratory distress syndrome: Secondary | ICD-10-CM | POA: Diagnosis not present

## 2020-02-16 DIAGNOSIS — J189 Pneumonia, unspecified organism: Secondary | ICD-10-CM | POA: Diagnosis not present

## 2020-02-16 DIAGNOSIS — J9601 Acute respiratory failure with hypoxia: Secondary | ICD-10-CM | POA: Diagnosis not present

## 2020-02-16 DIAGNOSIS — J9600 Acute respiratory failure, unspecified whether with hypoxia or hypercapnia: Secondary | ICD-10-CM | POA: Diagnosis not present

## 2020-02-16 DIAGNOSIS — R4182 Altered mental status, unspecified: Secondary | ICD-10-CM | POA: Diagnosis not present

## 2020-02-16 DIAGNOSIS — I517 Cardiomegaly: Secondary | ICD-10-CM | POA: Diagnosis not present

## 2020-02-16 LAB — COMPREHENSIVE METABOLIC PANEL
ALT: 54 U/L — ABNORMAL HIGH (ref 0–44)
AST: 37 U/L (ref 15–41)
Albumin: 2.6 g/dL — ABNORMAL LOW (ref 3.5–5.0)
Alkaline Phosphatase: 60 U/L (ref 38–126)
Anion gap: 10 (ref 5–15)
BUN: 18 mg/dL (ref 6–20)
CO2: 25 mmol/L (ref 22–32)
Calcium: 8 mg/dL — ABNORMAL LOW (ref 8.9–10.3)
Chloride: 99 mmol/L (ref 98–111)
Creatinine, Ser: 0.58 mg/dL (ref 0.44–1.00)
GFR, Estimated: >60 mL/min (ref >60)
Glucose, Bld: 90 mg/dL (ref 70–99)
Potassium: 4.6 mmol/L (ref 3.5–5.1)
Sodium: 134 mmol/L — ABNORMAL LOW (ref 135–145)
Total Bilirubin: 0.5 mg/dL (ref 0.3–1.2)
Total Protein: 6 g/dL — ABNORMAL LOW (ref 6.5–8.1)

## 2020-02-16 LAB — GLUCOSE, CAPILLARY
Glucose-Capillary: 89 mg/dL (ref 70–99)
Glucose-Capillary: 121 mg/dL — ABNORMAL HIGH (ref 70–99)
Glucose-Capillary: 100 mg/dL — ABNORMAL HIGH (ref 70–99)
Glucose-Capillary: 89 mg/dL (ref 70–99)
Glucose-Capillary: 122 mg/dL — ABNORMAL HIGH (ref 70–99)
Glucose-Capillary: 100 mg/dL — ABNORMAL HIGH (ref 70–99)

## 2020-02-16 LAB — CBC
HCT: 32.1 % — ABNORMAL LOW (ref 36.0–46.0)
Hemoglobin: 10.4 g/dL — ABNORMAL LOW (ref 12.0–15.0)
MCH: 30.6 pg (ref 26.0–34.0)
MCHC: 32.4 g/dL (ref 30.0–36.0)
MCV: 94.4 fL (ref 80.0–100.0)
Platelets: 278 10*3/uL (ref 150–400)
RBC: 3.4 MIL/uL — ABNORMAL LOW (ref 3.87–5.11)
RDW: 13.6 % (ref 11.5–15.5)
WBC: 11.7 10*3/uL — ABNORMAL HIGH (ref 4.0–10.5)
nRBC: 0 % (ref 0.0–0.2)

## 2020-02-16 LAB — MAGNESIUM: Magnesium: 2.5 mg/dL — ABNORMAL HIGH (ref 1.7–2.4)

## 2020-02-16 LAB — PHOSPHORUS: Phosphorus: 4.3 mg/dL (ref 2.5–4.6)

## 2020-02-16 LAB — PROCALCITONIN: Procalcitonin: <0.1 ng/mL

## 2020-02-16 LAB — D-DIMER, QUANTITATIVE: D-Dimer, Quant: 2.15 ug/mL-FEU — ABNORMAL HIGH (ref 0.00–0.50)

## 2020-02-16 MED ORDER — METHADONE HCL 5 MG PO TABS
5.0000 mg | ORAL_TABLET | Freq: Three times a day (TID) | ORAL | Status: DC
Start: 2020-02-16 — End: 2020-02-18
  Administered 2020-02-16 – 2020-02-18 (×6): 5 mg
  Filled 2020-02-16 (×6): qty 1

## 2020-02-16 MED ORDER — FREE WATER
200.0000 mL | Freq: Four times a day (QID) | Status: DC
  Administered 2020-02-16 – 2020-02-17 (×3): 200 mL

## 2020-02-16 MED ORDER — ENOXAPARIN SODIUM 60 MG/0.6ML ~~LOC~~ SOLN
50.0000 mg | SUBCUTANEOUS | Status: DC
  Administered 2020-02-16 – 2020-02-24 (×9): 50 mg via SUBCUTANEOUS
  Filled 2020-02-16 (×9): qty 0.6

## 2020-02-16 MED ORDER — METHADONE HCL 5 MG/5ML PO SOLN: 5.0000 mg | Freq: Three times a day (TID) | ORAL | Status: DC

## 2020-02-16 NOTE — Progress Notes (Signed)
Father, Donnie, called RN to inform that patient had been taking Suboxone strips at home up until she presented in the [facility] ED. 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

## 2020-02-16 NOTE — Plan of Care (Signed)
  Problem: Clinical Measurements: Goal: Respiratory complications will improve Outcome: Progressing Note: Although ventilator settings are minimal, it is difficult to wean sedation due to agitation and restlessness   Problem: Nutrition: Goal: Adequate nutrition will be maintained Outcome: Progressing   Problem: Elimination: Goal: Will not experience complications related to bowel motility Outcome: Progressing Goal: Will not experience complications related to urinary retention Outcome: Progressing

## 2020-02-16 NOTE — Progress Notes (Signed)
NAME:  Belinda [name], MRN:  [id number], DOB:  Aug 24, 1980, LOS: 18 ADMISSION DATE:  02/06/2020, CONSULTATION DATE:  12/16 REFERRING MD:  Josephine Cables ([facility]), CHIEF COMPLAINT:  Acute metabolic encephalopathy and respiratory failure in setting of COVID   BRIEF  39 year old white female, only medical history is obesity.  Presented to [facility] ER 12/15 w/ ARDS and acute metabolic encephalopathy 2/2 COVID  Past Medical History:  Obesity, anxiety, childhood asthma  Significant Hospital Events:  12/15 presented to the emergency room, hypoxic, encephalopathic, Covid positive.CT brain negative for acute injury with only some mild para sinus mucosal thickening with some periodontal disease.  Started on supplemental oxygen, IV Solu-Medrol, IV remdesivir, and baricitinib.  Also started empirically on cefepime  and vancomycin to cover for potential bacterial pneumonia 12/16 multiple reports of worsening confusion, intermittent combativeness, attempts to bite staff.  Not able to take oral medications.  Started on Precedex.  Transferred to [facility]. Changed abx to unasyn, added low dose ativan as pt chronically on xanax.  12/17: Patient more hypoxic, requiring more sedation. Decision made to proceed with intubation.  Intubated, left IJ catheter placed.  PF ratio only 88.  Core track ordered.  Neuromuscular blockade initiated.  Prone protocol initiated.  12/19 - No overnight events but this morning hypertensive and tachycardic. EKG personally reviewed, sinus tachycardia with normal axis. BIS values are low in the 30s. She is still on continuous paralytics.    12/20 -neuromuscular blockade discontinued yesterday.  At this point in time patient is extremely agitated on the ventilator despite Dilaudid infusion Versed infusion, oxycodone schedule, clonopoin scheduloed,  and phenobarb once daily at night.  She is on prednisone and Barcitinib. On vent 50%. AFebrikle  12/21  - 12/21 - cxr horrible. 40% fio2, pulse  ox 90%,.  On dilaudid gtt, oxycdone po, precedex gtt, versed gt and klonopin,  On phenobarb QHS x 2 night.  On haldol schedulesd x 1 dayt.   -. RASS -4 but easily goes to +2 per RN. On TF.  K 2.9 while on 29mkcl daily. + 4L volume overloa  12/22 - 40% fio2. On dilaudid gtt, [precedex gtt, versed gtt. On oxy and klonopin. Still very agitated On TF. -2L since admit   12/23 -> stil with significant intermittent agitation. On 30% fio2, Now on diprivan gtt, precedex gtt, fent gtt, versed gtt, levophed gtt. On vent, 30% fio2  12/24   - ? Low grade fever. 30% fio2 on vent. On fent gtt, prededex gtt, ketamine gtt, versed gtt . Also on levophed gtt. Some breakthrough agitation per RN but much better overall   Consults:  Not applicable  Procedures:  Intubation 12/17 Left IJ triple-lumen catheter 12/17   Significant Diagnostic Tests:  CT brain 12/15: Negative for acute CVA or acute process  Micro Data:  RSA PCR 12/15 urine culture>> 80,000 colonies GNR ->Klebsiella (resistant to ampi but sensitive to unasyn) Blood culture 12/15>>> Respiratory panel by RT-PCR 12/15: Positive for Covid xxxxx  Antimicrobials:  Remdesivir 12/15 Cefepime 12/15-->12/16 Vancomycin 12/15-->12/16 unasyn 12/16 (PCT 25, klbe uti) >>>12/20 xxx covid Rx  - barcitinib - endds 12/30 - prednisone - ends 12/24  Interim History / Subjective:    12/25 -  On fent gtt, precedex gtt, ketamnie gtt, versed gtt. On TF. On free water.  Low grade fever + . On vent  - 30%.   Objective   Blood pressure (!) 85/47, pulse 86, temperature 99.9 F (37.7 C), temperature source Oral, resp. rate 17, height 5'  3" (1.6 m), weight 100.6 kg, SpO2 96 %.    Vent Mode: PRVC FiO2 (%):  [30 %] 30 % Set Rate:  [15 bmp] 15 bmp Vt Set:  [320 mL] 320 mL PEEP:  [5 cmH20] 5 cmH20 Plateau Pressure:  [16 cmH20] 16 cmH20   Intake/Output Summary (Last 24 hours) at 02/16/2020 1223 Last data filed at 02/16/2020 1200 Gross per 24 hour  Intake  3963.21 ml  Output 5290 ml  Net -1326.79 ml   Filed Weights   02/14/20 0352 02/15/20 0500 02/16/20 0340  Weight: 103.8 kg 104.1 kg 100.6 kg     General Appearance:  Looks criticall ill OBESE - + Head:  Normocephalic, without obvious abnormality, atraumatic Eyes:  PERRL - yes, conjunctiva/corneas - muddy     Ears:  Normal external ear canals, both ears Nose:  G tube - yes Throat:  ETT TUBE - yes , OG tube - no Neck:  Supple,  No enlargement/tenderness/nodules Lungs: Clear to auscultation bilaterally, Ventilator   Synchrony - no Heart:  S1 and S2 normal, no murmur, CVP - x.  Pressors - no Abdomen:  Soft, no masses, no organomegaly Genitalia / Rectal:  Not done Extremities:  Extremities- intact Skin:  ntact in exposed areas . Sacral area - not xamined Neurologic:  Sedation - multiple -> RASS - +2 to -2 . Moves all 4s - yes. CAM-ICU - positive . Orientation - not oriented. Moving all over in bed      Resolved Hospital Problem list      Mild elevated LFTs - transamints  02/15/2020 -resolved  Plan monitor  Assessment & Plan:  Acute hypoxic respiratory failure in setting of Covid pneumonia, and ARDS, plus minus CAP versus aspiration - off nimbex 12/19   02/16/2020 - > does not meet criteria for SBT/Extubation in setting of Acute Respiratory Failure due to agitated encephalopathy   Plan PRVC VAP bundle Might be heading to trach if encephalopathy does not clear     COVID-19 s/p remedsivir/sp steroids ending 12/24  12./21 - d-dimer down to 1.78 from 2s. -> increaed to 2.15 on 12/25  plan Day 10 barcitinb    Klebsiella urinary tract infection with sepsis syndrome with or without aspiration pneumonia - unasyn ending 02/11/20  02/16/2020 - low grade fever since 02/14/20. Off Unasyn since 12/20  Plan -clinically monitor - check PCT - check trach aspirate - monitor without abx    Chronic xanax gtt Acute metabolic encephalopathy in the setting of sepsis and  Covid infection.  Chronically Xanax dependent   02/16/2020  Very agitated  on precedex gtt, ketamne gtt, fent gtt, versed gtt + scheduled oxycodone + shceduled klonopn and haldol  Plan PAD protocol  fentanyl gtt, START SCHEDULED MEthadone Versed gtt STOP scheduled oxycodone and Klonopin continue Precedex infusion Continue ketaming tt [place] cheduled Haldol and methadone with QTC monitoring   - check EKG 02/17/20      Volume overload  02/16/2020 --4.5L negative since admit  With help of lasix ECHO normal 02/12/20. Off pressors. Diuresis paused 02/14/20  Plan  - pause diuresis and monitor   Anemia of critical illness  02/16/2020 - no bleeding   Plan - PRBC for hgb </= 6.9gm%    - exceptions are   -  if ACS susepcted/confirmed then transfuse for hgb </= 8.0gm%,  or    -  active bleeding with hemodynamic instability, then transfuse regardless of hemoglobin value   At at all times try to transfuse 1 unit prbc as  possible with exception of active hemorrhage     Hyperglycemia Plan Resistant SSI Increase TF coverage and add long acting     Best practice (evaluated daily)  Diet: tube feeds advancing to goal Pain/Anxiety/Delirium protocol (if indicated):see above VAP protocol (if indicated): 12/17 DVT prophylaxis: Lovenox GI prophylaxis: PPI  Glucose control: Sliding scale insulin   Mobility: Bedrest currently requiring restraints  Disposition: Intensive care  Family:  DAd Donnie [place]ling[place] [phone] ->   updated 02/11/20 and 02/12/20 and 02/13/20   Updated 02/14/20 -> dad indicated that son Keenan Bachelor and [place] and    BF Lennette Bihari, and Dad Donnie and [place] mom love her and to tell patient thhis-> he feels this wil help her.   Updated 02/15/20 -> dad updated. Dad thinks the panda bear next to her is helping  Updated 02/16/20 - dad updated  Goals of Care:  Last date of multidisciplinary goals of care discussion: via phone so not able to do multidisciplinary  meeting  Family and staff present: NP and mother dianne  Summary of discussion: Full code  Follow up goals of care discussion due: Goals of care discussion due 12/23   Code Status: Full code      [place]   The patient Belinda [name] is critically ill with multiple organ systems failure and requires high complexity decision making for assessment and support, frequent evaluation and titration of therapies, application of advanced monitoring technologies and extensive interpretation of multiple databases.   Critical Care Time devoted to patient care services described in this note is  35  Minutes. This time reflects time of care of this signee Dr Brand Males. This critical care time does not reflect procedure time, or teaching time or supervisory time of PA/NP/Med student/Med Resident etc but could involve care discussion time     Dr. Brand Males, M.D., [facility]C.P Pulmonary and Critical Care Medicine Staff Physician [facility] Pager: [phone], If no answer or between  15:00h - 7:00h: call [phone]  02/16/2020 12:49 PM    LABS    PULMONARY Recent Labs  Lab 02/09/20 1414 02/10/20 0435 02/11/20 0456 02/12/20 0522 02/15/20 0459  PHART 7.356 7.392 7.376 7.435 7.444  PCO2ART 48.8* 44.2 51.6* 47.7 42.1  PO2ART 82* 89.6 119* 72* 58.3*  HCO3 27.5 26.5 29.6* 32.0* 28.3*  TCO2 29  --   --  33*  --   O2SAT 96.0 96.7 97.8 94.0 87.6    CBC Recent Labs  Lab 02/14/20 0325 02/15/20 0325 02/16/20 0421  HGB 9.8* 10.8* 10.4*  HCT 29.1* 33.1* 32.1*  WBC 10.2 9.1 11.7*  PLT 306 315 278    COAGULATION No results for input(s): INR in the last 168 hours.  CARDIAC  No results for input(s): TROPONINI in the last 168 hours. No results for input(s): PROBNP in the last 168 hours.   CHEMISTRY Recent Labs  Lab 02/12/20 0332 02/12/20 0522 02/13/20 0251 02/14/20 0325 02/15/20 0435 02/16/20 0421   NA 148* 147* 143 139 136 134*  K 3.2* 2.9* 3.4* 3.7 4.2 4.6  CL 111  --  101 99 100 99  CO2 28  --  30 28 27 25   GLUCOSE 149*  --  160* 121* 93 90  BUN 19  --  19 15 15 18   CREATININE 0.56  --  0.62 0.63 0.52 0.58  CALCIUM 7.5*  --  7.9* 7.8* 8.0* 8.0*  MG 2.3  --  2.2 2.4 2.4 2.5*  PHOS 3.2  --  3.9 4.7* 5.0* 4.3   Estimated Creatinine Clearance: 107.9 mL/min (by C-G formula based on SCr of 0.58 mg/dL).   LIVER Recent Labs  Lab 02/12/20 0332 02/13/20 0251 02/14/20 0325 02/15/20 0435 02/16/20 0421  AST 50* 40 44* 31 37  ALT 74* 66* 56* 48* 54*  ALKPHOS 56 60 57 50 60  BILITOT 0.6 0.6 0.7 0.5 0.5  PROT 5.1* 5.6* 5.2* 5.6* 6.0*  ALBUMIN 2.2* 2.5* 2.4* 2.4* 2.6*     INFECTIOUS Recent Labs  Lab 02/13/20 1258 02/15/20 0325  LATICACIDVEN 1.9 0.7     ENDOCRINE CBG (last 3)  Recent Labs    02/16/20 0310 02/16/20 0756 02/16/20 1153  GLUCAP 89 121* 100*         IMAGING x48h  - image(s) personally visualized  -   highlighted in bold DG CHEST PORT 1 VIEW  Result Date: 02/16/2020 CLINICAL DATA:  Altered mental status EXAM: PORTABLE CHEST 1 VIEW COMPARISON:  Yesterday FINDINGS: Endotracheal tube with tip halfway between the clavicular heads and carina. The feeding tube at least reaches the diaphragm. Left IJ line with tip at the brachiocephalic SVC confluence. Extensive bilateral pneumonia. Cardiomegaly. No visible air leak. IMPRESSION: Stable hardware positioning and extensive pneumonia. Electronically Signed   By: Monte Fantasia M.D.   On: 02/16/2020 07:08   DG CHEST PORT 1 VIEW  Result Date: 02/15/2020 CLINICAL DATA:  COVID positive.  Ventilator dependence. EXAM: PORTABLE CHEST 1 VIEW COMPARISON:  02/14/2020 FINDINGS: 0531 hours. Endotracheal tube tip is 2.7 cm above the base of the carina. A feeding tube passes into the stomach although the distal tip position is not included on the film. Left IJ central line tip overlies the innominate vein confluence. Stable  asymmetric elevation right hemidiaphragm. Relatively diffuse bilateral airspace disease again noted with mid and lower lung predominance. The cardio pericardial silhouette is enlarged. Telemetry leads overlie the chest. IMPRESSION: No substantial interval change in exam. Electronically Signed   By: Misty Stanley M.D.   On: 02/15/2020 09:02

## 2020-02-16 NOTE — Progress Notes (Signed)
Assisted family with tele visit via elink

## 2020-02-16 NOTE — Plan of Care (Signed)
  Problem: Health Behavior/Discharge Planning: Goal: Ability to manage health-related needs will improve Outcome: Progressing   Problem: Clinical Measurements: Goal: Ability to maintain clinical measurements within normal limits will improve Outcome: Progressing Goal: Will remain free from infection Outcome: Progressing   

## 2020-02-17 ENCOUNTER — Inpatient Hospital Stay (HOSPITAL_COMMUNITY): Payer: Medicaid Other

## 2020-02-17 DIAGNOSIS — U071 COVID-19: Secondary | ICD-10-CM | POA: Diagnosis not present

## 2020-02-17 DIAGNOSIS — J80 Acute respiratory distress syndrome: Secondary | ICD-10-CM | POA: Diagnosis not present

## 2020-02-17 DIAGNOSIS — J9600 Acute respiratory failure, unspecified whether with hypoxia or hypercapnia: Secondary | ICD-10-CM | POA: Diagnosis not present

## 2020-02-17 DIAGNOSIS — J9601 Acute respiratory failure with hypoxia: Secondary | ICD-10-CM | POA: Diagnosis not present

## 2020-02-17 DIAGNOSIS — I517 Cardiomegaly: Secondary | ICD-10-CM | POA: Diagnosis not present

## 2020-02-17 DIAGNOSIS — R7989 Other specified abnormal findings of blood chemistry: Secondary | ICD-10-CM | POA: Diagnosis not present

## 2020-02-17 DIAGNOSIS — G9341 Metabolic encephalopathy: Secondary | ICD-10-CM | POA: Diagnosis not present

## 2020-02-17 LAB — GLUCOSE, CAPILLARY
Glucose-Capillary: 132 mg/dL — ABNORMAL HIGH (ref 70–99)
Glucose-Capillary: 51 mg/dL — ABNORMAL LOW (ref 70–99)
Glucose-Capillary: 138 mg/dL — ABNORMAL HIGH (ref 70–99)
Glucose-Capillary: 148 mg/dL — ABNORMAL HIGH (ref 70–99)
Glucose-Capillary: 91 mg/dL (ref 70–99)
Glucose-Capillary: 229 mg/dL — ABNORMAL HIGH (ref 70–99)
Glucose-Capillary: 216 mg/dL — ABNORMAL HIGH (ref 70–99)

## 2020-02-17 LAB — POCT I-STAT 7, (LYTES, BLD GAS, ICA,H+H)
Acid-Base Excess: 4 mmol/L — ABNORMAL HIGH (ref 0.0–2.0)
Acid-Base Excess: 3 mmol/L — ABNORMAL HIGH (ref 0.0–2.0)
Acid-Base Excess: 4 mmol/L — ABNORMAL HIGH (ref 0.0–2.0)
Bicarbonate: 30.3 mmol/L — ABNORMAL HIGH (ref 20.0–28.0)
Bicarbonate: 28 mmol/L (ref 20.0–28.0)
Bicarbonate: 30.7 mmol/L — ABNORMAL HIGH (ref 20.0–28.0)
Calcium, Ion: 1.1 mmol/L — ABNORMAL LOW (ref 1.15–1.40)
Calcium, Ion: 1.09 mmol/L — ABNORMAL LOW (ref 1.15–1.40)
Calcium, Ion: 1.07 mmol/L — ABNORMAL LOW (ref 1.15–1.40)
HCT: 28 % — ABNORMAL LOW (ref 36.0–46.0)
HCT: 29 % — ABNORMAL LOW (ref 36.0–46.0)
HCT: 32 % — ABNORMAL LOW (ref 36.0–46.0)
Hemoglobin: 9.5 g/dL — ABNORMAL LOW (ref 12.0–15.0)
Hemoglobin: 9.9 g/dL — ABNORMAL LOW (ref 12.0–15.0)
Hemoglobin: 10.9 g/dL — ABNORMAL LOW (ref 12.0–15.0)
O2 Saturation: 100 %
O2 Saturation: 96 %
O2 Saturation: 98 %
Patient temperature: 100.9
Patient temperature: 99.5
Patient temperature: 99.5
Potassium: 4.2 mmol/L (ref 3.5–5.1)
Potassium: 3.7 mmol/L (ref 3.5–5.1)
Potassium: 4.2 mmol/L (ref 3.5–5.1)
Sodium: 132 mmol/L — ABNORMAL LOW (ref 135–145)
Sodium: 137 mmol/L (ref 135–145)
Sodium: 132 mmol/L — ABNORMAL LOW (ref 135–145)
TCO2: 32 mmol/L (ref 22–32)
TCO2: 29 mmol/L (ref 22–32)
TCO2: 32 mmol/L (ref 22–32)
pCO2 arterial: 55.6 mmHg — ABNORMAL HIGH (ref 32.0–48.0)
pCO2 arterial: 43.7 mmHg (ref 32.0–48.0)
pCO2 arterial: 56.7 mmHg — ABNORMAL HIGH (ref 32.0–48.0)
pH, Arterial: 7.35 (ref 7.350–7.450)
pH, Arterial: 7.416 (ref 7.350–7.450)
pH, Arterial: 7.344 — ABNORMAL LOW (ref 7.350–7.450)
pO2, Arterial: 285 mmHg — ABNORMAL HIGH (ref 83.0–108.0)
pO2, Arterial: 86 mmHg (ref 83.0–108.0)
pO2, Arterial: 107 mmHg (ref 83.0–108.0)

## 2020-02-17 LAB — COMPREHENSIVE METABOLIC PANEL
ALT: 46 U/L — ABNORMAL HIGH (ref 0–44)
ALT: 79 U/L — ABNORMAL HIGH (ref 0–44)
AST: 29 U/L (ref 15–41)
AST: 60 U/L — ABNORMAL HIGH (ref 15–41)
Albumin: 2.4 g/dL — ABNORMAL LOW (ref 3.5–5.0)
Albumin: 2.8 g/dL — ABNORMAL LOW (ref 3.5–5.0)
Alkaline Phosphatase: 54 U/L (ref 38–126)
Alkaline Phosphatase: 85 U/L (ref 38–126)
Anion gap: 9 (ref 5–15)
Anion gap: 11 (ref 5–15)
BUN: 15 mg/dL (ref 6–20)
BUN: 12 mg/dL (ref 6–20)
CO2: 26 mmol/L (ref 22–32)
CO2: 25 mmol/L (ref 22–32)
Calcium: 7.9 mg/dL — ABNORMAL LOW (ref 8.9–10.3)
Calcium: 8 mg/dL — ABNORMAL LOW (ref 8.9–10.3)
Chloride: 99 mmol/L (ref 98–111)
Chloride: 95 mmol/L — ABNORMAL LOW (ref 98–111)
Creatinine, Ser: 0.6 mg/dL (ref 0.44–1.00)
Creatinine, Ser: 0.67 mg/dL (ref 0.44–1.00)
GFR, Estimated: >60 mL/min (ref >60)
GFR, Estimated: >60 mL/min (ref >60)
Glucose, Bld: 135 mg/dL — ABNORMAL HIGH (ref 70–99)
Glucose, Bld: 263 mg/dL — ABNORMAL HIGH (ref 70–99)
Potassium: 4.4 mmol/L (ref 3.5–5.1)
Potassium: 4.2 mmol/L (ref 3.5–5.1)
Sodium: 134 mmol/L — ABNORMAL LOW (ref 135–145)
Sodium: 131 mmol/L — ABNORMAL LOW (ref 135–145)
Total Bilirubin: 0.6 mg/dL (ref 0.3–1.2)
Total Bilirubin: 0.8 mg/dL (ref 0.3–1.2)
Total Protein: 5.6 g/dL — ABNORMAL LOW (ref 6.5–8.1)
Total Protein: 7 g/dL (ref 6.5–8.1)

## 2020-02-17 LAB — PROCALCITONIN: Procalcitonin: <0.1 ng/mL

## 2020-02-17 LAB — CBC
HCT: 28.2 % — ABNORMAL LOW (ref 36.0–46.0)
HCT: 31.9 % — ABNORMAL LOW (ref 36.0–46.0)
Hemoglobin: 9.1 g/dL — ABNORMAL LOW (ref 12.0–15.0)
Hemoglobin: 10.7 g/dL — ABNORMAL LOW (ref 12.0–15.0)
MCH: 30.7 pg (ref 26.0–34.0)
MCH: 31.2 pg (ref 26.0–34.0)
MCHC: 32.3 g/dL (ref 30.0–36.0)
MCHC: 33.5 g/dL (ref 30.0–36.0)
MCV: 95.3 fL (ref 80.0–100.0)
MCV: 93 fL (ref 80.0–100.0)
Platelets: 258 10*3/uL (ref 150–400)
Platelets: 322 10*3/uL (ref 150–400)
RBC: 2.96 MIL/uL — ABNORMAL LOW (ref 3.87–5.11)
RBC: 3.43 MIL/uL — ABNORMAL LOW (ref 3.87–5.11)
RDW: 13.4 % (ref 11.5–15.5)
RDW: 13.4 % (ref 11.5–15.5)
WBC: 9.6 10*3/uL (ref 4.0–10.5)
WBC: 12.6 10*3/uL — ABNORMAL HIGH (ref 4.0–10.5)
nRBC: 0 % (ref 0.0–0.2)
nRBC: 0 % (ref 0.0–0.2)

## 2020-02-17 LAB — SEDIMENTATION RATE: Sed Rate: 112 mm/hr — ABNORMAL HIGH (ref 0–22)

## 2020-02-17 LAB — PHOSPHORUS
Phosphorus: 3.7 mg/dL (ref 2.5–4.6)
Phosphorus: 3.4 mg/dL (ref 2.5–4.6)

## 2020-02-17 LAB — MAGNESIUM
Magnesium: 2.4 mg/dL (ref 1.7–2.4)
Magnesium: 2 mg/dL (ref 1.7–2.4)

## 2020-02-17 LAB — LACTIC ACID, PLASMA: Lactic Acid, Venous: 0.8 mmol/L (ref 0.5–1.9)

## 2020-02-17 MED ORDER — PROPOFOL 1000 MG/100ML IV EMUL
0.0000 ug/kg/min | INTRAVENOUS | Status: DC
  Administered 2020-02-17: 5 ug/kg/min via INTRAVENOUS
  Administered 2020-02-17: 30 ug/kg/min via INTRAVENOUS
  Filled 2020-02-17 (×2): qty 100

## 2020-02-17 MED ORDER — VANCOMYCIN HCL 10 G IV SOLR
2000.0000 mg | Freq: Once | INTRAVENOUS | Status: DC
  Filled 2020-02-17: qty 2000

## 2020-02-17 MED ORDER — ROCURONIUM BROMIDE 10 MG/ML (PF) SYRINGE
PREFILLED_SYRINGE | INTRAVENOUS | Status: AC
  Filled 2020-02-17: qty 10

## 2020-02-17 MED ORDER — IPRATROPIUM-ALBUTEROL 0.5-2.5 (3) MG/3ML IN SOLN
3.0000 mL | Freq: Four times a day (QID) | RESPIRATORY_TRACT | Status: DC
  Administered 2020-02-17 – 2020-02-22 (×18): 3 mL via RESPIRATORY_TRACT
  Filled 2020-02-17 (×14): qty 3

## 2020-02-17 MED ORDER — EPINEPHRINE 1 MG/10ML IJ SOSY
0.1000 mg | PREFILLED_SYRINGE | Freq: Once | INTRAMUSCULAR | Status: AC
  Administered 2020-02-17: 0.1 mg via INTRAVENOUS

## 2020-02-17 MED ORDER — THIAMINE HCL 100 MG/ML IJ SOLN
250.0000 mg | Freq: Every day | INTRAVENOUS | Status: AC
  Administered 2020-02-20 – 2020-02-25 (×6): 250 mg via INTRAVENOUS
  Filled 2020-02-17 (×6): qty 2.5

## 2020-02-17 MED ORDER — VANCOMYCIN HCL IN DEXTROSE 1-5 GM/200ML-% IV SOLN
1000.0000 mg | Freq: Three times a day (TID) | INTRAVENOUS | Status: DC
  Administered 2020-02-17 – 2020-02-18 (×2): 1000 mg via INTRAVENOUS
  Filled 2020-02-17 (×3): qty 200

## 2020-02-17 MED ORDER — CLONAZEPAM 1 MG PO TABS
1.0000 mg | ORAL_TABLET | Freq: Two times a day (BID) | ORAL | Status: DC
  Administered 2020-02-17 – 2020-02-18 (×2): 1 mg via ORAL
  Filled 2020-02-17 (×2): qty 1

## 2020-02-17 MED ORDER — ROCURONIUM BROMIDE 50 MG/5ML IV SOLN
1.0000 mg/kg | INTRAVENOUS | Status: DC | PRN
  Filled 2020-02-17: qty 10.03

## 2020-02-17 MED ORDER — ROCURONIUM BROMIDE 50 MG/5ML IV SOLN
70.0000 mg | Freq: Once | INTRAVENOUS | Status: AC
  Administered 2020-02-17: 70 mg via INTRAVENOUS

## 2020-02-17 MED ORDER — HYDROMORPHONE HCL 1 MG/ML IJ SOLN
INTRAMUSCULAR | Status: AC
  Filled 2020-02-17: qty 4

## 2020-02-17 MED ORDER — ARTIFICIAL TEARS OPHTHALMIC OINT
1.0000 "application " | TOPICAL_OINTMENT | Freq: Three times a day (TID) | OPHTHALMIC | Status: DC
  Administered 2020-02-17 – 2020-02-21 (×11): 1 via OPHTHALMIC
  Filled 2020-02-17 (×3): qty 3.5

## 2020-02-17 MED ORDER — THIAMINE HCL 100 MG/ML IJ SOLN
100.0000 mg | Freq: Every day | INTRAMUSCULAR | Status: DC
  Administered 2020-02-26 – 2020-02-28 (×3): 100 mg via INTRAVENOUS
  Filled 2020-02-17 (×4): qty 2

## 2020-02-17 MED ORDER — SODIUM CHLORIDE 0.9 % IV SOLN
0.0000 ug/kg/min | INTRAVENOUS | Status: DC
  Administered 2020-02-17 – 2020-02-18 (×4): 3 ug/kg/min via INTRAVENOUS
  Administered 2020-02-19 (×3): 3.5 ug/kg/min via INTRAVENOUS
  Filled 2020-02-17 (×8): qty 20

## 2020-02-17 MED ORDER — ROCURONIUM BROMIDE 10 MG/ML (PF) SYRINGE
PREFILLED_SYRINGE | INTRAVENOUS | Status: AC
  Administered 2020-02-17: 100 mg
  Filled 2020-02-17: qty 10

## 2020-02-17 MED ORDER — POTASSIUM CHLORIDE 20 MEQ PO PACK
20.0000 meq | PACK | Freq: Two times a day (BID) | ORAL | Status: DC
  Administered 2020-02-17 – 2020-02-18 (×3): 20 meq
  Filled 2020-02-17 (×3): qty 1

## 2020-02-17 MED ORDER — CISATRACURIUM BOLUS VIA INFUSION
0.1000 mg/kg | Freq: Once | INTRAVENOUS | Status: AC
  Administered 2020-02-17: 10 mg via INTRAVENOUS
  Filled 2020-02-17: qty 10

## 2020-02-17 MED ORDER — FUROSEMIDE 10 MG/ML IJ SOLN
40.0000 mg | Freq: Three times a day (TID) | INTRAMUSCULAR | Status: DC
  Administered 2020-02-17 – 2020-02-18 (×3): 40 mg via INTRAVENOUS
  Filled 2020-02-17 (×3): qty 4

## 2020-02-17 MED ORDER — HYDROMORPHONE HCL 4 MG/ML IJ SOLN
4.0000 mg | INTRAMUSCULAR | Status: DC
  Administered 2020-02-17 – 2020-02-22 (×28): 4 mg via INTRAVENOUS
  Filled 2020-02-17 (×28): qty 1

## 2020-02-17 MED ORDER — ROCURONIUM BROMIDE 50 MG/5ML IV SOLN: 70.0000 mg | Freq: Once | INTRAVENOUS | Status: AC

## 2020-02-17 MED ORDER — VANCOMYCIN HCL 2000 MG/400ML IV SOLN
2000.0000 mg | Freq: Once | INTRAVENOUS | Status: AC
  Administered 2020-02-17: 2000 mg via INTRAVENOUS
  Filled 2020-02-17: qty 400

## 2020-02-17 MED ORDER — HYDROMORPHONE HCL 2 MG/ML IJ SOLN
4.0000 mg | INTRAMUSCULAR | Status: DC
  Administered 2020-02-17: 4 mg via INTRAVENOUS
  Filled 2020-02-17: qty 2

## 2020-02-17 MED ORDER — ROCURONIUM BROMIDE 50 MG/5ML IV SOLN: 100.0000 mg | Freq: Once | INTRAVENOUS | Status: DC

## 2020-02-17 MED ORDER — THIAMINE HCL 100 MG/ML IJ SOLN
500.0000 mg | Freq: Three times a day (TID) | INTRAVENOUS | Status: AC
  Administered 2020-02-17 – 2020-02-19 (×6): 500 mg via INTRAVENOUS
  Filled 2020-02-17 (×7): qty 5

## 2020-02-17 MED ORDER — ROCURONIUM BROMIDE 10 MG/ML (PF) SYRINGE: 1.0000 mg/kg | PREFILLED_SYRINGE | INTRAVENOUS | Status: DC | PRN

## 2020-02-17 MED ORDER — DEXTROSE 50 % IV SOLN
INTRAVENOUS | Status: AC
  Administered 2020-02-17: 50 mL
  Filled 2020-02-17: qty 50

## 2020-02-17 MED ORDER — ARTIFICIAL TEARS OPHTHALMIC OINT
1.0000 "application " | TOPICAL_OINTMENT | Freq: Three times a day (TID) | OPHTHALMIC | Status: DC
  Administered 2020-02-17 – 2020-02-18 (×2): 1 via OPHTHALMIC

## 2020-02-17 MED ORDER — MELATONIN 3 MG PO TABS: 3.0000 mg | ORAL_TABLET | Freq: Every day | ORAL | Status: DC

## 2020-02-17 MED ORDER — MELATONIN 3 MG PO TABS
3.0000 mg | ORAL_TABLET | Freq: Every day | ORAL | Status: DC
  Administered 2020-02-17 – 2020-02-28 (×12): 3 mg
  Filled 2020-02-17 (×13): qty 1

## 2020-02-17 MED ORDER — PANTOPRAZOLE SODIUM 40 MG IV SOLR: 40.0000 mg | Freq: Every day | INTRAVENOUS | Status: DC

## 2020-02-17 NOTE — Progress Notes (Addendum)
NAME:  Belinda [name], MRN:  [id number], DOB:  05/09/1980, LOS: 47 ADMISSION DATE:  02/06/2020, CONSULTATION DATE:  12/16 REFERRING MD:  Josephine Cables ([facility]), CHIEF COMPLAINT:  Acute metabolic encephalopathy and respiratory failure in setting of COVID   BRIEF  39 year old white female, only medical history is obesity.  Presented to [facility] ER 12/15 w/ ARDS and acute metabolic encephalopathy 2/2 COVID  Past Medical History:  Obesity, anxiety, childhood asthma   (new info 12/26  - patient on suboxone and has hx of opioid OD in 2018) and hx of regular neb for [facility] Events:  12/15 presented to the emergency room, hypoxic, encephalopathic, Covid positive.CT brain negative for acute injury with only some mild para sinus mucosal thickening with some periodontal disease.  Started on supplemental oxygen, IV Solu-Medrol, IV remdesivir, and baricitinib.  Also started empirically on cefepime  and vancomycin to cover for potential bacterial pneumonia 12/16 multiple reports of worsening confusion, intermittent combativeness, attempts to bite staff.  Not able to take oral medications.  Started on Precedex.  Transferred to [facility]. Changed abx to unasyn, added low dose ativan as pt chronically on xanax.  12/17: Patient more hypoxic, requiring more sedation. Decision made to proceed with intubation.  Intubated, left IJ catheter placed.  PF ratio only 88.  Core track ordered.  Neuromuscular blockade initiated.  Prone protocol initiated.  12/19 - No overnight events but this morning hypertensive and tachycardic. EKG personally reviewed, sinus tachycardia with normal axis. BIS values are low in the 30s. She is still on continuous paralytics.    12/20 -neuromuscular blockade discontinued yesterday.  At this point in time patient is extremely agitated on the ventilator despite Dilaudid infusion Versed infusion, oxycodone schedule, clonopoin scheduloed,  and phenobarb once daily at night.   She is on prednisone and Barcitinib. On vent 50%. AFebrikle  12/21  - 12/21 - cxr horrible. 40% fio2, pulse ox 90%,.  On dilaudid gtt, oxycdone po, precedex gtt, versed gt and klonopin,  On phenobarb QHS x 2 night.  On haldol schedulesd x 1 dayt.   -. RASS -4 but easily goes to +2 per RN. On TF.  K 2.9 while on 61mkcl daily. + 4L volume overloa  12/22 - 40% fio2. On dilaudid gtt, [precedex gtt, versed gtt. On oxy and klonopin. Still very agitated On TF. -2L since admit   12/23 -> stil with significant intermittent agitation. On 30% fio2, Now on diprivan gtt, precedex gtt, fent gtt, versed gtt, levophed gtt. On vent, 30% fio2  12/24   - ? Low grade fever. 30% fio2 on vent. On fent gtt, prededex gtt, ketamine gtt, versed gtt . Also on levophed gtt. Some breakthrough agitation per RN but much better overall. Off diprivan gtt   12/26 -  On fent gtt, precedex gtt, ketamnie gtt, versed gtt. On TF. On free water.  Low grade fever + . On vent  - 30   Consults:  Not applicable  Procedures:  Intubation 12/17 Left IJ triple-lumen catheter 12/17   Significant Diagnostic Tests:  CT brain 12/15: Negative for acute CVA or acute process  Micro Data:  MRSA PCR 12/15 urine culture>> 80,000 colonies GNR ->Klebsiella (resistant to ampi but sensitive to unasyn) Blood culture 12/15>>> Respiratory panel by RT-PCR 12/15: Positive for Covid xxxxx Trach aspirate 12/25 - staph aureus nos   Antimicrobials:  Remdesivir 12/15 Cefepime 12/15-->12/16 Vancomycin 12/15-->12/16 unasyn 12/16 (PCT 25, klbe uti) >>>12/20 xxx covid Rx  - barcitinib - endds  12/30 or 12/29 - prednisone - ended 12/24 xxx Vanc 12/26 >>   Interim History / Subjective:   12/26 - Day 9 ventilator significant agitation. -> needed roc for agitation. On fent gtt, on ketamine gtt, on versed gtt, on precedex gtt. On haldol scheduled since 12/25 . On metadhone schedule since 12/25. Klonopin and Oxycodone stopped 12/25. Per Dad:   (new info 12/26  - patient on suboxone and has hx of opioid OD in 2018)  -9l negative per charting but cxr looks wet and patient has some wheeze   EKG QTc - 400s  Objective   Blood pressure 114/69, pulse (!) 112, temperature (!) 100.9 F (38.3 C), temperature source Axillary, resp. rate (!) 26, height _0  (1.6 m), weight 100.3 kg, SpO2 100 %.    Vent Mode: PRVC FiO2 (%):  [30 %-80 %] 80 % Set Rate:  [15 bmp-28 bmp] 28 bmp Vt Set:  [320 mL] 320 mL PEEP:  [5 cmH20-8 cmH20] 8 cmH20 Plateau Pressure:  [19 cmH20] 19 cmH20   Intake/Output Summary (Last 24 hours) at 02/17/2020 1134 Last data filed at 02/17/2020 0600 Gross per 24 hour  Intake 2980.58 ml  Output 3540 ml  Net -559.42 ml   Filed Weights   02/15/20 0500 02/16/20 0340 02/17/20 0158  Weight: 104.1 kg 100.6 kg 100.3 kg    General Appearance:  Looks criticall ill OBESE - + Head:  Normocephalic, without obvious abnormality, atraumatic Eyes:  PERRL - yes, conjunctiva/corneas - muddy     Ears:  Normal external ear canals, both ears Nose:  G tube - yes Throat:  ETT TUBE - yes , OG tube - n Neck:  Supple,  No enlargement/tenderness/nodules Lungs: wheezing bilaterally, Ventilator   Synchrony - yes but only after post roc. Needed more fio2 Heart:  S1 and S2 normal, no murmur, CVP - x.  Pressors - no Abdomen:  Soft, no masses, no organomegaly Genitalia / Rectal:  Not done Extremities:  Extremities- intact Skin:  ntact in exposed areas . Sacral area - not examined Neurologic:  Sedation - multiple agents  -> RASS - +4 agitated -> Roc -> RASS -4 and sync with vent but fio2 needs increased . Moves all 4s - yes pre roc. CAM-ICU - +ve for delirum.  . Orientation - not oriented        Resolved Hospital Problem list      Mild elevated LFTs - transamints 02/15/2020 -resolved    Assessment & Plan:  Baseline asthma nos hx Acute hypoxic respiratory failure in setting of Covid pneumonia, and ARDS, plus minus CAP versus  aspiration - off nimbex 12/19  02/17/2020 - > does not meet criteria for SBT/Extubation in setting of Acute Respiratory Failure due to wet cxr , wheeze and agitated encephalopathy   Plan PRVC VAP bundle Start BD scheduled 02/17/20 -> might need IV steroids if it does not resolve with lasix High prob for trach need  COVID-19   - s/p remedsivir  - sp steroids ending 12/24  12./21 - d-dimer down to 1.78 from 2s. -> increaed to 2.15 on 12/25  plan barcitinb ending 02/20/20 Check duplex LE    Klebsiella urinary tract infection with sepsis syndrome with or without aspiration pneumonia - unasyn ending 02/11/20 Concern for Staph NOS VAP on 12/26  02/17/2020 - low grade fever since 02/14/20. Off Unasyn since 12/20. PCT negative but trach aspirate 12/25 growing few staph auresu  Plan -start vanc 02/17/20   Chronic xanax gtt (new info 12/26  -  patient on suboxone and has hx of opioid OD in 0177) Acute metabolic encephalopathy in the setting of sepsis and Covid infection.  Chronically Xanax dependent   02/17/2020  Very agitated  Despite precedex gtt, ketamne gtt, fent gtt, versed gtt + scheduled methadone (since 12/25) and haldol (since 12/25). OFf shceduled klonopn and haldol since 12/25  Plan  fentanyl gtt, Continue SCHEDULED MEthadone since 12/25 - needs QTc check - wait 3-5 days for onset Cotnine schedule haldol since 12/25 with qtc monitor Continue Versed gtt continue Precedex infusion Continue ketamine gtt Add diprivan gtt Add ROC PRN  Get CT head Get EEG Check autopimmune - ANA, DS DNA, complement, RF, CCP, ESR    Volume overload  02/17/2020 --  ECHO normal 02/12/20.  Diuresis paused 02/14/20 but 12/26 clinically appars very volume overloaded on 12/26 and wheezing  Plan  - restart lasix 02/17/20  Anemia of critical illness  02/17/2020 - no bleeding   Plan - PRBC for hgb </= 6.9gm%    - exceptions are   -  if ACS susepcted/confirmed then transfuse for hgb  </= 8.0gm%,  or    -  active bleeding with hemodynamic instability, then transfuse regardless of hemoglobin value   At at all times try to transfuse 1 unit prbc as possible with exception of active hemorrhage     Hyperglycemia Plan Resistant SSI Increase TF coverage and add long acting     Best practice (evaluated daily)  Diet: tube feeds advancing to goal  Pain/Anxiety/Delirium protocol (if indicated):see above  VAP protocol (if indicated): 12/17   DVT prophylaxis: Lovenox   GI prophylaxis: PPI  Glucose control: Sliding scale insulin   Mobility: Bedrest currently requiring restraints  Disposition: Intensive care  Family:  DAd Donnie Hollinghead [phone] ->   updated 02/11/20   updated 02/12/20   Updated 02/13/20   Updated 02/14/20 -> dad indicated that son Keenan Bachelor and [place] and    BF Lennette Bihari, and Dad Donnie and [place] mom love her and to tell patient thhis-> he feels this wil help her.   Updated 02/15/20 -> dad updated. Dad thinks the panda bear next to her is helping  Updated 02/16/20 - dad updated  Updated 12/26 - need for diuresis, abx and prn paralytic, difficult agitation, and heading to trach  Goals of Care:  Last date of multidisciplinary goals of care discussion: via phone so not able to do multidisciplinary meeting  Family and staff present: NP and mother dianne  Summary of discussion: Full code  Follow up goals of care discussion due: Goals of care discussion due 12/23   Code Status: Full code    [place]   The patient ALEXANDR OEHLER is critically ill with multiple organ systems failure and requires high complexity decision making for assessment and support, frequent evaluation and titration of therapies, application of advanced monitoring technologies and extensive interpretation of multiple databases.   Critical Care Time devoted to patient care services described in this note is  40  Minutes. This time reflects time of  care of this signee Dr Brand Males. This critical care time does not reflect procedure time, or teaching time or supervisory time of PA/NP/Med student/Med Resident etc but could involve care discussion time     Dr. Brand Males, M.D., [facility].C.P Pulmonary and Critical Care Medicine Staff Physician [place] [facility] Pager: [phone], If no answer or between  15:00h - 7:00h: call [phone]  02/17/2020 11:48 AM     LABS    PULMONARY Recent Labs  Lab 02/11/20 0456 02/12/20 0522 02/15/20 0459 02/17/20 1114  PHART 7.376 7.435 7.444 7.350  PCO2ART 51.6* 47.7 42.1 55.6*  PO2ART 119* 72* 58.3* 285*  HCO3 29.6* 32.0* 28.3* 30.3*  TCO2  --  33*  --  32  O2SAT 97.8 94.0 87.6 100.0    CBC Recent Labs  Lab 02/15/20 0325 02/16/20 0421 02/17/20 0348 02/17/20 1114  HGB 10.8* 10.4* 9.1* 9.5*  HCT 33.1* 32.1* 28.2* 28.0*  WBC 9.1 11.7* 9.6  --   PLT 315 278 258  --     COAGULATION No results for input(s): INR in the last 168 hours.  CARDIAC  No results for input(s): TROPONINI in the last 168 hours. No results for input(s): PROBNP in the last 168 hours.   CHEMISTRY Recent Labs  Lab 02/13/20 0251 02/14/20 0325 02/15/20 0435 02/16/20 0421 02/17/20 0348 02/17/20 1114  NA 143 139 136 134* 134* 132*  K 3.4* 3.7 4.2 4.6 4.4 4.2  CL 101 99 100 99 99  --   CO2 _0 --   GLUCOSE 160* 121* 93 90 135*  --   BUN _1 --   CREATININE 0.62 0.63 0.52 0.58 0.60  --   CALCIUM 7.9* 7.8* 8.0* 8.0* 7.9*  --   MG 2.2 2.4 2.4 2.5* 2.4  --   PHOS 3.9 4.7* 5.0* 4.3 3.7  --    Estimated Creatinine Clearance: 107.8 mL/min (by C-G formula based on SCr of 0.6 mg/dL).   LIVER Recent Labs  Lab 02/13/20 0251 02/14/20 0325 02/15/20 0435 02/16/20 0421 02/17/20 0348  AST 40 44* 31 37 29  ALT 66* 56* 48* 54* 46*  ALKPHOS 60 57 50 60 54  BILITOT 0.6 0.7 0.5 0.5 0.6  PROT 5.6* 5.2* 5.6* 6.0* 5.6*  ALBUMIN 2.5*  2.4* 2.4* 2.6* 2.4*     INFECTIOUS Recent Labs  Lab 02/13/20 1258 02/15/20 0325 02/16/20 1352 02/17/20 0348  LATICACIDVEN 1.9 0.7  --   --   PROCALCITON  --   --  <0.10 <0.10     ENDOCRINE CBG (last 3)  Recent Labs    02/17/20 0332 02/17/20 0750 02/17/20 0906  GLUCAP 132* 51* 138*         IMAGING x48h  - image(s) personally visualized  -   highlighted in bold DG CHEST PORT 1 VIEW  Result Date: 02/17/2020 CLINICAL DATA:  Intubation EXAM: PORTABLE CHEST 1 VIEW COMPARISON:  02/16/2020 FINDINGS: No significant interval change in AP portable chest radiograph, with diffuse bilateral interstitial heterogeneous airspace opacity, most conspicuous at the lung bases. Mild cardiomegaly. Support apparatus is unchanged including endotracheal tube, esophagogastric tube, and left neck vascular catheter. IMPRESSION: 1. No significant interval change in AP portable chest radiograph, with diffuse bilateral interstitial heterogeneous airspace opacity, most conspicuous at the lung bases. Findings are consistent with multifocal infection, edema, and/or ARDS. 2.  Unchanged support apparatus. Electronically Signed   By: Eddie Candle M.D.   On: 02/17/2020 11:26   DG CHEST PORT 1 VIEW  Result Date: 02/16/2020 CLINICAL DATA:  Altered mental status EXAM: PORTABLE CHEST 1 VIEW COMPARISON:  Yesterday FINDINGS: Endotracheal tube with tip halfway between the clavicular heads and carina. The feeding tube at least reaches the diaphragm. Left IJ line with tip at the brachiocephalic SVC confluence. Extensive bilateral pneumonia. Cardiomegaly. No visible air leak. IMPRESSION: Stable hardware  positioning and extensive pneumonia. Electronically Signed   By: Monte Fantasia M.D.   On: 02/16/2020 07:08

## 2020-02-17 NOTE — Progress Notes (Signed)
EEG complete - results pending 

## 2020-02-17 NOTE — Progress Notes (Addendum)
   Called urgently to the bedside. Patient is on multiple sedative agents. She has been agitated all day very difficult to control. We got CT head and this does not show any reason for encephalopathy. EEG is noncontributory. Doppler ultrasound does not have any DVT. All of a sudden she became bradycardic according to the nurse. Patient almost coded. Epi was given and a code was averted. At this point in time all sedation agents have been paused  At the time of my evaluation she has spontaneous circulation with blood pressure. She is on the ventilator   Assessment -Refractory delirium severe  Plan -Get stat EKG -Check blood work CBC and chemistry and lactic acid and magnesium and phosphorus -Check blood gas -Stop Precedex -Start scheduled Dilaudid pusehs - continue roc prn -Continue scheduled Haldol with scheduled fentanyl infusion, Versed infusion and propofol infusion -Reintroduce scheduled oral Klonopin   - neuro consult - d./w DR Absher  - 30 min additional ccm time at 5.05pm    Addendum  - nursing indicating that refractory delirium is worse and Roc not sufficient  Plan  - will start nimbex gtt and aim for trach next 1-2 days (d/w Dr Angelica Chessman) -  - might need nimbex overngith and then trach - called dad Donnie Hollinghead 6:20 PM  To give updated bu LMTCB - once nimbex onboard stop diprivan and ketamine -   additionlal 30 miin at 6:21 PM  For calls etc., at beside and creating plan - will start niibmex     SIGNATURE    Dr. Brand Males, M.D., F.C.C.P,  Pulmonary and Critical Care Medicine Staff Physician, [place] Director - Interstitial Lung Disease  Program  Pulmonary [place] [facility] [place], [place], [address]  Pager: [phone], If no answer  OR between  19:00-7:00h: page [phone] Telephone (clinical office): [phone] Telephone (research): [phone]  5:05 PM 02/17/2020

## 2020-02-17 NOTE — Progress Notes (Addendum)
Pharmacy Antibiotic Note  Belinda [name] is a 39 y.o. female admitted on 02/06/2020 with ARDS and COVID. Pharmacy has been consulted for vancomycin dosing for s. Aureus in trach aspirate. WBC wnl. Tmax 100.9 - isolated low grade fevers x2. Scr 0.60 at baseline.   Plan: Vancomycin 2054m x1 loading dose Start vancomcyin [id number]  Follow up sensitivities on trach aspirate Monitor renal function.   Height: 5' 3"  (160 cm) Weight: 100.3 kg (221 lb 1.9 oz) IBW/kg (Calculated) : 52.4  Temp (24hrs), Avg:99.7 F (37.6 C), Min:98.9 F (37.2 C), Max:100.9 F (38.3 C)  Recent Labs  Lab 02/13/20 0251 02/13/20 1258 02/14/20 0325 02/15/20 0325 02/15/20 0435 02/16/20 0421 02/17/20 0348  WBC 10.2  --  10.2 9.1  --  11.7* 9.6  CREATININE 0.62  --  0.63  --  0.52 0.58 0.60  LATICACIDVEN  --  1.9  --  0.7  --   --   --     Estimated Creatinine Clearance: 107.8 mL/min (by C-G formula based on SCr of 0.6 mg/dL).    No Known Allergies  Antimicrobials this admission: Ceftriaxone x1 10/15 Vancomycin 12/15>>12/16; 12/26 >> Cefepime 12/15 >>12/16 Metronidazole 12/15 >>12/16 Unasyn 12/16>>12/20 Baricitinib 12/16 >>12/30 Remdesivir 12/15 (only got 10085mf 1st dose load) >> 12/19  Dose adjustments this admission: N/A  Microbiology results: 12/15BCx: neg 12/15UCx: 80k Kleb pneumo - R Amp, I-Nitrofur, S all others 12/15 covid+ 12/16 strep pneumo/legionella - neg 12/16 mrsa pcr - neg 12/26 TA: f ew s. aureus  Thank you for allowing pharmacy to be a part of this patient's care.  GraCristela FeltharmD Clinical Pharmacist  02/17/2020 11:41 AM

## 2020-02-17 NOTE — Progress Notes (Signed)
VASCULAR LAB    Bilateral lower extremity venous duplex has been performed.  See CV proc for preliminary results.   Jadin Kagel, RVT 02/17/2020, 4:41 PM

## 2020-02-17 NOTE — Procedures (Signed)
Patient Name: Belinda [name]  MRN: [id number]  Epilepsy Attending: Lora Havens  Referring Physician/Provider: Dr Brand Males Date: 02/17/2020 Duration: 24.30 mins  Patient history: 39yo F with ams in setting of sepsis and Covid. EEG to evaluate for seizure  Level of alertness:  comatose  AEDs during EEG study: Versed  Technical aspects: This EEG study was done with scalp electrodes positioned according to the 10-20 International system of electrode placement. Electrical activity was acquired at a sampling rate of 500Hz  and reviewed with a high frequency filter of 70Hz  and a low frequency filter of 1Hz . EEG data were recorded continuously and digitally stored.   Description: EEG showed continuous generalized 3 to 6 Hz theta-delta slowing admixed with an excessive amount of 15 to 18 Hz beta activity distributed symmetrically and diffusely.  Hyperventilation and photic stimulation were not performed.     ABNORMALITY -Excessive beta, generalized -Continuous slow, generalized  IMPRESSION: This study is suggestive of severe diffuse encephalopathy, nonspecific etiology but likely related to sedation. No seizures or epileptiform discharges were seen throughout the recording.  Channel Papandrea Barbra Sarks

## 2020-02-17 NOTE — Progress Notes (Signed)
[facility] Progress Note Patient Name: Belinda [name] DOB: 02/13/1981 MRN: [id number]   Date of Service  02/17/2020  HPI/Events of Note  Vent/sedation review.  Camera: discussed with bed side RN. In synchrony with vent. On propofol at 10/ketamine 20. Aim is to wean off tonight if possible and stay on nimbex/fenta and versed, as per Dr Brantley Persons to do hand off.   eICU Interventions  As above. sats 95% on 320/8/28/100%, PIP ok.      Intervention Category Intermediate Interventions: Other:  [name] 02/17/2020, 7:41 PM

## 2020-02-17 NOTE — Consult Note (Addendum)
NEUROLOGY CONSULTATION NOTE   Date of service: February 17, 2020 Patient Name: Belinda [name] MRN:  [id number] DOB:  1980-07-20 Reason for consult: "refractory hyperactive delirium" _ _ _   _ __   _ __ _ _  __ __   _ __   __ _  History of Present Illness  Belinda [name] is a 39 y.o. female with PMH significant for obesity admitted with COVID Pneumonia and severe ARDS. Transferred to ICU for hypoxia and delirium. She has required multiple medications to assist with management of hyperactive delirium and to help her tolerate vent including Clonopin 75m PRN, Haldol 575mBID, Dilaudid 80m28m4H, Methadone 5mg35mH, Precedex gtt 1mcg19m/hr, Fentanyl 400mcg36m Ketamine 0.5mg/kg5m, Versed 10mg/Hr70mopofol 20mcg/Kg92m. She became bradycardic today and almost coded. She was given Epi and a code was averted.  Neurology was consulted to assist with management of refractory delirium.   ROS   Unable to obtain ROS.  Past History   Unable to obtain PMH, PSH, FHx, SHx due to intubation.  Social History   Socioeconomic History  . Marital status: Legally Separated    Spouse name: Not on file  . Number of children: Not on file  . Years of education: Not on file  . Highest education level: Not on file  Occupational History  . Not on file  Tobacco Use  . Smoking status: Not on file  . Smokeless tobacco: Not on file  Substance and Sexual Activity  . Alcohol use: Not on file  . Drug use: Not on file  . Sexual activity: Not on file  Other Topics Concern  . Not on file  Social History Narrative  . Not on file   Social Determinants of Health   Financial Resource Strain: Not on file  Food Insecurity: Not on file  Transportation Needs: Not on file  Physical Activity: Not on file  Stress: Not on file  Social Connections: Not on file   No Known Allergies  Medications   Medications Prior to Admission  Medication Sig Dispense Refill Last Dose  . albuterol (VENTOLIN HFA) 108 (90 Base)  MCG/ACT inhaler Inhale 2 puffs into the lungs every 6 (six) hours as needed for wheezing or shortness of breath.   UNK  . ALPRAZolam (XANAX) 1 MG tablet Take 1 mg by mouth 4 (four) times daily as needed for anxiety.   UNK  . cetirizine (ZYRTEC) 10 MG tablet Take 10 mg by mouth daily.   UNK  . clobetasol (TEMOVATE) 0.05 % external solution Apply 1 application topically 2 (two) times daily as needed (skin irritation).   UNK  . dexlansoprazole (DEXILANT) 60 MG capsule Take 60 mg by mouth daily.   UNK  . famotidine (PEPCID) 40 MG tablet Take 40 mg by mouth daily.   UNK  . fluticasone (FLONASE) 50 MCG/ACT nasal spray Place 2 sprays into both nostrils daily.   UNK  . fluticasone (FLOVENT HFA) 110 MCG/ACT inhaler Inhale 1 puff into the lungs 2 (two) times daily.   UNK  . linaclotide (LINZESS) 290 MCG CAPS capsule Take 290 mcg by mouth daily before breakfast.   UNK  . cefdinir (OMNICEF) 300 MG capsule Take 300 mg by mouth 2 (two) times daily. For 10 days (Patient not taking: Reported on 02/08/2020)   Completed Course at Unknown time     Vitals   Vitals:   02/17/20 1554 02/17/20 1600 02/17/20 1700 02/17/20 1800  BP:  (!) 81/58 98/64 (!) 142/92  Pulse:  64 78 (!) 124  Resp:  (!) 28  (!) 26  Temp: 99.5 F (37.5 C)     TempSrc: Axillary     SpO2:  100% 96% 97%  Weight:      Height:         Body mass index is 39.17 kg/m.  Physical Exam   General: Laying in bed, intubated. HENT: Normal oropharynx and mucosa. Normal external appearance of ears and nose. Poor dentition. Neck: Supple, no pain or tenderness CV: No JVD. No peripheral edema. Pulmonary: Symmetric Chest rise. Normal respiratory effort. Abdomen: Soft to touch, non-tender. Ext: No cyanosis, edema, or deformity, some skin bruising in arms likely from IVs Skin: No rash. Normal palpation of skin.  Musculoskeletal: Normal digits and nails by inspection. No clubbing.  Neurologic Examination on sedation and paralytics   Mentation: No  response to voice or loud clap, no response to nares stimulation or sternal rub. Brainstem reflexes: Pupils 8m BL and sluggish response to light BL Corneals: absent Cough: absent Gag: absent.  Motor/sensory: No response to nailbed pressure in any of the extremities.  Labs   CBC:  Recent Labs  Lab 02/11/20 0455 02/12/20 0332 02/16/20 0421 02/17/20 0348 02/17/20 1114  WBC 11.7*   < > 11.7* 9.6  --   NEUTROABS 8.8*  --   --   --   --   HGB 10.2*   < > 10.4* 9.1* 9.5*  HCT 31.9*   < > 32.1* 28.2* 28.0*  MCV 93.5   < > 94.4 95.3  --   PLT 252   < > 278 258  --    < > = values in this interval not displayed.    Basic Metabolic Panel:  Lab Results  Component Value Date   NA 132 (L) 02/17/2020   K 4.2 02/17/2020   CO2 26 02/17/2020   GLUCOSE 135 (H) 02/17/2020   BUN 15 02/17/2020   CREATININE 0.60 02/17/2020   CALCIUM 7.9 (L) 02/17/2020   GFRNONAA >60 02/17/2020   Lipid Panel: No results found for: LDLCALC HgbA1c:  Lab Results  Component Value Date   HGBA1C 5.8 (H) 02/08/2020   Urine Drug Screen:     Component Value Date/Time   LABOPIA POSITIVE (A) 02/07/2020 1206   COCAINSCRNUR NONE DETECTED 02/07/2020 1206   LABBENZ POSITIVE (A) 02/07/2020 1206   AMPHETMU NONE DETECTED 02/07/2020 1206   THCU NONE DETECTED 02/07/2020 1206   LABBARB NONE DETECTED 02/07/2020 1206    Alcohol Level No results found for: E[place] CT Head without contrast: Personally reviewed and no acute intracranial abnormality.  rEEG: severe diffuse encephalopathy, no seizures.  Impression   Belinda SCARFOis a 39y.o. female admitted with COVID Pneumonia and noted to have persistent refractory hyperactive delirium. She is already on multiple medications targeting multiple pathways implicated in hyperactive delirium including opiods(Fentanyl 576m Q154mPRN, Dilaudid 4mg53mH PRN), antipsychotics (Haldol 5mg 11m), GABA (Clonazepam, Midazolam), Propofol (20mcg17mmin), NMDA(Ketamine 0.5mg/kg28m).  She was also on Alpha2 agent (Dexmedetomidine) but was stopped after an episode of bradycardia.  Unfortunately, there are not a lot of other pharmacological options left that have demonstrable benefit in hyperactive delirium. We are already targeting pain, providing supportive care for COVID Pneumonia. She is outside the withdrawal window at this time for most recreational substances. I would just add wernicke's dose thiamine which can present with hyperactive delirium.  Recommendations  - I ordered melatonin 3mg QHS65mI ordered Delirium precautions. Would recommend attempting to  minimize stimuli. - Agree with CT Head without contrast when able. - Recommend daily EKG to monitor QTc interval. - I ordered Thiamine (Wernickes dose). - I ordered serum Ammonia.  ______________________________________________________________________   Thank you for the opportunity to take part in the care of this patient. If you have any further questions, please contact the neurology consultation attending.  Signed,  [place] Pager Number [phone] _ _ _   _ __   _ __ _ _  __ __   _ __   __ _   This patient is critically ill and at significant risk of neurological worsening, death and care requires constant monitoring of vital signs, hemodynamics,respiratory and cardiac monitoring, neurological assessment, discussion with family, other specialists and medical decision making of high complexity. I spent 35 minutes of neurocritical care time  in the care of  this patient. This was time spent independent of any time provided by nurse practitioner or PA.  Donnetta Simpers [facility] Pager Number [phone] 02/17/2020

## 2020-02-18 ENCOUNTER — Inpatient Hospital Stay (HOSPITAL_COMMUNITY): Payer: Medicaid Other

## 2020-02-18 DIAGNOSIS — U071 COVID-19: Secondary | ICD-10-CM | POA: Diagnosis not present

## 2020-02-18 DIAGNOSIS — J80 Acute respiratory distress syndrome: Secondary | ICD-10-CM | POA: Diagnosis not present

## 2020-02-18 DIAGNOSIS — G9341 Metabolic encephalopathy: Secondary | ICD-10-CM | POA: Diagnosis not present

## 2020-02-18 DIAGNOSIS — J9600 Acute respiratory failure, unspecified whether with hypoxia or hypercapnia: Secondary | ICD-10-CM | POA: Diagnosis not present

## 2020-02-18 DIAGNOSIS — J9601 Acute respiratory failure with hypoxia: Secondary | ICD-10-CM | POA: Diagnosis not present

## 2020-02-18 DIAGNOSIS — R4182 Altered mental status, unspecified: Secondary | ICD-10-CM | POA: Diagnosis not present

## 2020-02-18 DIAGNOSIS — J1282 Pneumonia due to coronavirus disease 2019: Secondary | ICD-10-CM | POA: Diagnosis not present

## 2020-02-18 LAB — CBC
HCT: 35 % — ABNORMAL LOW (ref 36.0–46.0)
Hemoglobin: 11.1 g/dL — ABNORMAL LOW (ref 12.0–15.0)
MCH: 30.2 pg (ref 26.0–34.0)
MCHC: 31.7 g/dL (ref 30.0–36.0)
MCV: 95.4 fL (ref 80.0–100.0)
Platelets: 328 10*3/uL (ref 150–400)
RBC: 3.67 MIL/uL — ABNORMAL LOW (ref 3.87–5.11)
RDW: 13.5 % (ref 11.5–15.5)
WBC: 14 10*3/uL — ABNORMAL HIGH (ref 4.0–10.5)
nRBC: 0 % (ref 0.0–0.2)

## 2020-02-18 LAB — COMPREHENSIVE METABOLIC PANEL
ALT: 78 U/L — ABNORMAL HIGH (ref 0–44)
AST: 40 U/L (ref 15–41)
Albumin: 2.8 g/dL — ABNORMAL LOW (ref 3.5–5.0)
Alkaline Phosphatase: 93 U/L (ref 38–126)
Anion gap: 13 (ref 5–15)
BUN: 12 mg/dL (ref 6–20)
CO2: 27 mmol/L (ref 22–32)
Calcium: 8.2 mg/dL — ABNORMAL LOW (ref 8.9–10.3)
Chloride: 93 mmol/L — ABNORMAL LOW (ref 98–111)
Creatinine, Ser: 0.65 mg/dL (ref 0.44–1.00)
GFR, Estimated: >60 mL/min (ref >60)
Glucose, Bld: 190 mg/dL — ABNORMAL HIGH (ref 70–99)
Potassium: 4.4 mmol/L (ref 3.5–5.1)
Sodium: 133 mmol/L — ABNORMAL LOW (ref 135–145)
Total Bilirubin: 0.8 mg/dL (ref 0.3–1.2)
Total Protein: 7.2 g/dL (ref 6.5–8.1)

## 2020-02-18 LAB — GLUCOSE, CAPILLARY
Glucose-Capillary: 153 mg/dL — ABNORMAL HIGH (ref 70–99)
Glucose-Capillary: 195 mg/dL — ABNORMAL HIGH (ref 70–99)
Glucose-Capillary: 206 mg/dL — ABNORMAL HIGH (ref 70–99)
Glucose-Capillary: 140 mg/dL — ABNORMAL HIGH (ref 70–99)
Glucose-Capillary: 181 mg/dL — ABNORMAL HIGH (ref 70–99)
Glucose-Capillary: 172 mg/dL — ABNORMAL HIGH (ref 70–99)

## 2020-02-18 LAB — TRIGLYCERIDES: Triglycerides: 243 mg/dL — ABNORMAL HIGH (ref <150)

## 2020-02-18 LAB — CK TOTAL AND CKMB (NOT AT ARMC)
CK, MB: 2.4 ng/mL (ref 0.5–5.0)
Relative Index: 1.2 (ref 0.0–2.5)
Total CK: 200 U/L (ref 38–234)

## 2020-02-18 LAB — CULTURE, RESPIRATORY W GRAM STAIN

## 2020-02-18 LAB — PROCALCITONIN: Procalcitonin: 0.27 ng/mL

## 2020-02-18 LAB — LACTIC ACID, PLASMA: Lactic Acid, Venous: 0.8 mmol/L (ref 0.5–1.9)

## 2020-02-18 MED ORDER — FUROSEMIDE 10 MG/ML IJ SOLN
40.0000 mg | Freq: Every day | INTRAMUSCULAR | Status: DC
  Administered 2020-02-19 – 2020-02-26 (×8): 40 mg via INTRAVENOUS
  Filled 2020-02-18 (×8): qty 4

## 2020-02-18 MED ORDER — POTASSIUM CHLORIDE 20 MEQ PO PACK
20.0000 meq | PACK | Freq: Every day | ORAL | Status: DC
  Administered 2020-02-19 – 2020-02-28 (×10): 20 meq
  Filled 2020-02-18 (×12): qty 1

## 2020-02-18 MED ORDER — CLONAZEPAM 1 MG PO TABS
1.5000 mg | ORAL_TABLET | Freq: Two times a day (BID) | ORAL | Status: DC
  Administered 2020-02-18 – 2020-02-20 (×5): 1.5 mg via ORAL
  Filled 2020-02-18 (×5): qty 1

## 2020-02-18 MED ORDER — METHADONE HCL 5 MG PO TABS
10.0000 mg | ORAL_TABLET | Freq: Two times a day (BID) | ORAL | Status: DC
  Administered 2020-02-18 – 2020-02-20 (×5): 10 mg
  Filled 2020-02-18 (×5): qty 2

## 2020-02-18 MED ORDER — CEFAZOLIN SODIUM-DEXTROSE 2-4 GM/100ML-% IV SOLN
2.0000 g | Freq: Three times a day (TID) | INTRAVENOUS | Status: AC
  Administered 2020-02-18 – 2020-02-23 (×17): 2 g via INTRAVENOUS
  Filled 2020-02-18 (×19): qty 100

## 2020-02-18 NOTE — Progress Notes (Signed)
Patient transported from 3M10 to CT and back with no complications.

## 2020-02-18 NOTE — Progress Notes (Addendum)
NAME:  Belinda [name], MRN:  [id number], DOB:  16-Feb-1981, LOS: 52 ADMISSION DATE:  02/06/2020, CONSULTATION DATE:  12/16 REFERRING MD:  Josephine Cables ([facility]), CHIEF COMPLAINT:  Acute metabolic encephalopathy and respiratory failure in setting of COVID   BRIEF  39 year old white female, only medical history is obesity.  Presented to [facility] ER 12/15 w/ ARDS and acute metabolic encephalopathy 2/2 COVID  Past Medical History:  Obesity, anxiety, childhood asthma   (new info 12/26  - patient on suboxone and has hx of opioid OD in 2018) and hx of [facility] Events:  12/15 presented to the emergency room, hypoxic, encephalopathic, Covid positive.CT brain negative for acute injury with only some mild para sinus mucosal thickening with some periodontal disease.  Started on supplemental oxygen, IV Solu-Medrol, IV remdesivir, and baricitinib.  Also started empirically on cefepime  and vancomycin to cover for potential bacterial pneumonia 12/16 multiple reports of worsening confusion, intermittent combativeness, attempts to bite staff.  Not able to take oral medications.  Started on Precedex.  Transferred to [facility]. Changed abx to unasyn, added low dose ativan as pt chronically on xanax.  12/17: Patient more hypoxic, requiring more sedation. Decision made to proceed with intubation.  Intubated, left IJ catheter placed.  PF ratio only 88.  Core track ordered.  Neuromuscular blockade initiated.  Prone protocol initiated.  12/19 - No overnight events but this morning hypertensive and tachycardic. EKG personally reviewed, sinus tachycardia with normal axis. BIS values are low in the 30s. She is still on continuous paralytics.    12/20 -neuromuscular blockade discontinued yesterday.  At this point in time patient is extremely agitated on the ventilator despite Dilaudid infusion Versed infusion, oxycodone schedule, clonopoin scheduloed,  and phenobarb once daily at night.   She is on prednisone and Barcitinib. On vent 50%. AFebrikle  12/21  - 12/21 - cxr horrible. 40% fio2, pulse ox 90%,.  On dilaudid gtt, oxycdone po, precedex gtt, versed gt and klonopin,  On phenobarb QHS x 2 night.  On haldol schedulesd x 1 dayt.   -. RASS -4 but easily goes to +2 per RN. On TF.  K 2.9 while on 4mkcl daily. + 4L volume overloa  12/22 - 40% fio2. On dilaudid gtt, [precedex gtt, versed gtt. On oxy and klonopin. Still very agitated On TF. -2L since admit   12/23 -> stil with significant intermittent agitation. On 30% fio2, Now on diprivan gtt, precedex gtt, fent gtt, versed gtt, levophed gtt. On vent, 30% fio2  12/24   - ? Low grade fever. 30% fio2 on vent. On fent gtt, prededex gtt, ketamine gtt, versed gtt . Also on levophed gtt. Some breakthrough agitation per RN but much better overall. Off diprivan gtt   12/26 -  On fent gtt, precedex gtt, ketamnie gtt, versed gtt. On TF. On free water.  Low grade fever + . On vent  - 30   Consults:  Not applicable  Procedures:  Intubation 12/17 Left IJ triple-lumen catheter 12/17   Significant Diagnostic Tests:  CT brain 12/15: Negative for acute CVA or acute process Head CT 12/26 >> nothing acute  Micro Data:  MRSA PCR 12/15 urine culture>> 80,000 colonies GNR ->Klebsiella (resistant to ampi but sensitive to unasyn) Blood culture 12/15>>> Respiratory panel by RT-PCR 12/15: Positive for Covid xxxxx Trach aspirate 12/25 - staph aureus nos   Antimicrobials:  Remdesivir 12/15 Cefepime 12/15-->12/16 Vancomycin 12/15-->12/16 unasyn 12/16 (PCT 25, klbe uti) >>>12/20 xxx covid  Rx  - barcitinib - endds 12/30 or 12/29 - prednisone - ended 12/24 xxx Vanc 12/26 >> 12/27 Cefazolin 12/27 >>    Interim History / Subjective:   Agitation has been a large barrier.  She has required initiation of paralytics in order to maintain vent synchrony and to prevent hyperactive agitation Neurology input appreciated, melatonin,  thiamine added Head CT as above  Objective   Blood pressure 110/73, pulse 88, temperature 100.2 F (37.9 C), temperature source Axillary, resp. rate (!) 28, height 5' 3"  (1.6 m), weight 97.5 kg, SpO2 94 %.    Vent Mode: PRVC FiO2 (%):  [50 %-100 %] 50 % Set Rate:  [28 bmp] 28 bmp Vt Set:  [320 mL] 320 mL PEEP:  [8 cmH20] 8 cmH20 Plateau Pressure:  [18 cmH20-22 cmH20] 22 cmH20   Intake/Output Summary (Last 24 hours) at 02/18/2020 0945 Last data filed at 02/18/2020 7026 Gross per 24 hour  Intake 3033.36 ml  Output 7800 ml  Net -4766.64 ml   Filed Weights   02/16/20 0340 02/17/20 0158 02/18/20 0339  Weight: 100.6 kg 100.3 kg 97.5 kg    General Appearance:  Obese critically ill, ventilated HEENT: ETT in place, OGT Lungs: small breaths, no wheeze or crackles Heart:  Distant, no M Abdomen:  Non-distended, hypoactive BS Extremities:  No edema Skin:  No rash Neurologic:  paralyzed   Resolved Hospital Problem list    Mild elevated LFTs - transamints 02/15/2020 -resolved  Assessment & Plan:  Baseline asthma nos hx Acute hypoxic respiratory failure in setting of Covid pneumonia, and ARDS, plus minus CAP versus aspiration  Some improved gas exchange through the hospitalization.  Largest barrier to improvement currently is her severe agitated delirium  Plan Currently paralyzed to maintain vent synchrony, adequate oxygenation.  Goal to wean to off as below Wean PEEP, FiO2 as able VAP prevention order set Continue bronchodilators as ordered, no wheezing currently, no role for steroids at this time Suspect that she will require tracheostomy to facilitate progress given her lung injury, encephalopathy  COVID-19   - s/p remedsivir  - sp steroids ended 12/24  plan -Baricitinib, plan stop date 12/30 -Lower extremity Doppler ultrasound negative 12/26   Klebsiella urinary tract infection with sepsis syndrome with or without aspiration pneumonia - unasyn ended 02/11/20 Staph  NOS VAP on 12/26  02/17/2020 - low grade fever since 02/14/20. Off Unasyn since 12/20. PCT negative but trach aspirate 12/25 growing few staph auresu  Plan -MSSA on respiratory culture, plan change vancomycin to cefazolin on 12/27   Chronic xanax gtt (new info 12/26  - patient on suboxone and has hx of opioid OD in 3785) Acute metabolic encephalopathy in the setting of sepsis and Covid infection.  Chronically Xanax dependent   Agitated with dyssynchrony almost to the point of coding on 12/26  Plan On scheduled methadone (since 12/25), scheduled Haldol (12/25), scheduled Dilaudid, clonazepam (12/19) Off ketamine and propofol as of 12/27 On fentanyl, Versed infusions Remains on Nimbex 12/27.  Goal transition to off today Repeat head CT 12/26 reassuring Autoimmune labs sent 12/26 and pending    Total body Volume overload ECHO normal 02/12/20.  Plan -Lasix restarted 40 mg every 8 hours on 12/26.  High urine output over last 24 hours, now I/O net -13 L.  Plan to change Lasix to 40 mg daily and follow  Anemia of critical illness Plan Follow CBC Goal hemoglobin 7.0  Hyperglycemia Plan Resistant sliding-scale insulin coverage Levemir 10 units twice daily  Best practice (evaluated daily)  Diet: Tube feeding   Pain/Anxiety/Delirium protocol (if indicated): Complicated enteral and IV regimen, see above  VAP protocol (if indicated): 12/17  DVT prophylaxis: Lovenox  GI prophylaxis: PPI  Glucose control: Sliding scale insulin plus Levemir  Mobility: Bedrest currently requiring restraints  Disposition: Intensive care  Family:  DAd Donnie Hollinghead [phone] -> spoke with him by phone 12/27. Discussed utility of trach with him - he agrees to this.    Goals of Care:  Last date of multidisciplinary goals of care discussion: via phone so not able to do multidisciplinary meeting  Family and staff present: NP and mother dianne  Summary of discussion: Full code  Follow  up goals of care discussion due: Goals of care discussion due 12/23   Code Status: Full code    [place]   The patient Belinda [name] is critically ill with multiple organ systems failure and requires high complexity decision making for assessment and support, frequent evaluation and titration of therapies, application of advanced monitoring technologies and extensive interpretation of multiple databases.   Independent CC time 34 minutes  Baltazar Apo, MD, PhD 02/18/2020, 11:13 AM [facility] [phone] or if no answer [phone]   LABS    PULMONARY Recent Labs  Lab 02/12/20 0522 02/15/20 0459 02/17/20 1114 02/17/20 1730 02/17/20 2128  PHART 7.435 7.444 7.350 7.416 7.344*  PCO2ART 47.7 42.1 55.6* 43.7 56.7*  PO2ART 72* 58.3* 285* 86 107  HCO3 32.0* 28.3* 30.3* 28.0 30.7*  TCO2 33*  --  32 29 32  O2SAT 94.0 87.6 100.0 96.0 98.0    CBC Recent Labs  Lab 02/17/20 0348 02/17/20 1114 02/17/20 2012 02/17/20 2128 02/18/20 0332  HGB 9.1*   < > 10.7* 10.9* 11.1*  HCT 28.2*   < > 31.9* 32.0* 35.0*  WBC 9.6  --  12.6*  --  14.0*  PLT 258  --  322  --  328   < > = values in this interval not displayed.    COAGULATION No results for input(s): INR in the last 168 hours.  CARDIAC  No results for input(s): TROPONINI in the last 168 hours. No results for input(s): PROBNP in the last 168 hours.   CHEMISTRY Recent Labs  Lab 02/14/20 0325 02/15/20 0435 02/16/20 0421 02/17/20 0348 02/17/20 1114 02/17/20 1730 02/17/20 2012 02/17/20 2128 02/18/20 0332  NA 139 136 134* 134* 132* 137 131* 132* 133*  K 3.7 4.2 4.6 4.4 4.2 3.7 4.2 4.2 4.4  CL 99 100 99 99  --   --  95*  --  93*  CO2 28 27 25 26   --   --  25  --  27  GLUCOSE 121* 93 90 135*  --   --  263*  --  190*  BUN 15 15 18 15   --   --  12  --  12  CREATININE 0.63 0.52 0.58 0.60  --   --  0.67  --  0.65  CALCIUM 7.8* 8.0* 8.0* 7.9*  --   --  8.0*  --  8.2*  MG 2.4  2.4 2.5* 2.4  --   --  2.0  --   --   PHOS 4.7* 5.0* 4.3 3.7  --   --  3.4  --   --    Estimated Creatinine Clearance: 106 mL/min (by C-G formula based on SCr of 0.65 mg/dL).   LIVER Recent Labs  Lab 02/15/20 [phone]  02/16/20 0421 02/17/20 0348 02/17/20 2012 02/18/20 0332  AST 31 37 29 60* 40  ALT 48* 54* 46* 79* 78*  ALKPHOS 50 60 54 85 93  BILITOT 0.5 0.5 0.6 0.8 0.8  PROT 5.6* 6.0* 5.6* 7.0 7.2  ALBUMIN 2.4* 2.6* 2.4* 2.8* 2.8*     INFECTIOUS Recent Labs  Lab 02/15/20 0325 02/16/20 1352 02/17/20 0348 02/17/20 2012 02/18/20 0332  LATICACIDVEN 0.7  --   --  0.8 0.8  PROCALCITON  --  <0.10 <0.10  --  0.27     ENDOCRINE CBG (last 3)  Recent Labs    02/17/20 2311 02/18/20 0335 02/18/20 0740  GLUCAP 216* 153* 195*

## 2020-02-19 ENCOUNTER — Inpatient Hospital Stay (HOSPITAL_COMMUNITY): Payer: Medicaid Other

## 2020-02-19 DIAGNOSIS — J80 Acute respiratory distress syndrome: Secondary | ICD-10-CM | POA: Diagnosis not present

## 2020-02-19 DIAGNOSIS — J9601 Acute respiratory failure with hypoxia: Secondary | ICD-10-CM | POA: Diagnosis not present

## 2020-02-19 DIAGNOSIS — J9600 Acute respiratory failure, unspecified whether with hypoxia or hypercapnia: Secondary | ICD-10-CM | POA: Diagnosis not present

## 2020-02-19 DIAGNOSIS — U071 COVID-19: Secondary | ICD-10-CM | POA: Diagnosis not present

## 2020-02-19 DIAGNOSIS — G9341 Metabolic encephalopathy: Secondary | ICD-10-CM | POA: Diagnosis not present

## 2020-02-19 DIAGNOSIS — J9811 Atelectasis: Secondary | ICD-10-CM | POA: Diagnosis not present

## 2020-02-19 DIAGNOSIS — J90 Pleural effusion, not elsewhere classified: Secondary | ICD-10-CM | POA: Diagnosis not present

## 2020-02-19 DIAGNOSIS — R0902 Hypoxemia: Secondary | ICD-10-CM | POA: Diagnosis not present

## 2020-02-19 DIAGNOSIS — J1282 Pneumonia due to coronavirus disease 2019: Secondary | ICD-10-CM | POA: Diagnosis not present

## 2020-02-19 DIAGNOSIS — J811 Chronic pulmonary edema: Secondary | ICD-10-CM | POA: Diagnosis not present

## 2020-02-19 DIAGNOSIS — I517 Cardiomegaly: Secondary | ICD-10-CM | POA: Diagnosis not present

## 2020-02-19 DIAGNOSIS — J9690 Respiratory failure, unspecified, unspecified whether with hypoxia or hypercapnia: Secondary | ICD-10-CM | POA: Diagnosis not present

## 2020-02-19 LAB — ANTI-DNA ANTIBODY, DOUBLE-STRANDED: ds DNA Ab: <1 IU/mL (ref 0–9)

## 2020-02-19 LAB — RHEUMATOID FACTOR: Rheumatoid fact SerPl-aCnc: 18.2 IU/mL — ABNORMAL HIGH (ref <14.0)

## 2020-02-19 LAB — CBC
HCT: 29.4 % — ABNORMAL LOW (ref 36.0–46.0)
Hemoglobin: 9.9 g/dL — ABNORMAL LOW (ref 12.0–15.0)
MCH: 31.5 pg (ref 26.0–34.0)
MCHC: 33.7 g/dL (ref 30.0–36.0)
MCV: 93.6 fL (ref 80.0–100.0)
Platelets: 284 10*3/uL (ref 150–400)
RBC: 3.14 MIL/uL — ABNORMAL LOW (ref 3.87–5.11)
RDW: 13.6 % (ref 11.5–15.5)
WBC: 14.2 10*3/uL — ABNORMAL HIGH (ref 4.0–10.5)
nRBC: 0 % (ref 0.0–0.2)

## 2020-02-19 LAB — C4 COMPLEMENT: Complement C4, Body Fluid: 52 mg/dL — ABNORMAL HIGH (ref 12–38)

## 2020-02-19 LAB — GLUCOSE, CAPILLARY
Glucose-Capillary: 112 mg/dL — ABNORMAL HIGH (ref 70–99)
Glucose-Capillary: 131 mg/dL — ABNORMAL HIGH (ref 70–99)
Glucose-Capillary: 153 mg/dL — ABNORMAL HIGH (ref 70–99)
Glucose-Capillary: 165 mg/dL — ABNORMAL HIGH (ref 70–99)
Glucose-Capillary: 78 mg/dL (ref 70–99)
Glucose-Capillary: 79 mg/dL (ref 70–99)

## 2020-02-19 LAB — BASIC METABOLIC PANEL
Anion gap: 6 (ref 5–15)
BUN: 12 mg/dL (ref 6–20)
CO2: 30 mmol/L (ref 22–32)
Calcium: 8.2 mg/dL — ABNORMAL LOW (ref 8.9–10.3)
Chloride: 98 mmol/L (ref 98–111)
Creatinine, Ser: 0.55 mg/dL (ref 0.44–1.00)
GFR, Estimated: >60 mL/min (ref >60)
Glucose, Bld: 169 mg/dL — ABNORMAL HIGH (ref 70–99)
Potassium: 4.5 mmol/L (ref 3.5–5.1)
Sodium: 134 mmol/L — ABNORMAL LOW (ref 135–145)

## 2020-02-19 LAB — ANTINUCLEAR ANTIBODIES, IFA: ANA Ab, IFA: NEGATIVE

## 2020-02-19 LAB — CYCLIC CITRUL PEPTIDE ANTIBODY, IGG/IGA: CCP Antibodies IgG/IgA: 12 units (ref 0–19)

## 2020-02-19 LAB — C3 COMPLEMENT: C3 Complement: 204 mg/dL — ABNORMAL HIGH (ref 82–167)

## 2020-02-19 LAB — PHOSPHORUS: Phosphorus: 2.4 mg/dL — ABNORMAL LOW (ref 2.5–4.6)

## 2020-02-19 LAB — AMMONIA: Ammonia: 30 umol/L (ref 9–35)

## 2020-02-19 LAB — MAGNESIUM: Magnesium: 2.3 mg/dL (ref 1.7–2.4)

## 2020-02-19 MED ORDER — MIDAZOLAM HCL 2 MG/2ML IJ SOLN: 5.0000 mg | Freq: Once | INTRAMUSCULAR | Status: DC

## 2020-02-19 MED ORDER — VECURONIUM BROMIDE 10 MG IV SOLR: 10.0000 mg | Freq: Once | INTRAVENOUS | Status: DC

## 2020-02-19 MED ORDER — DOCUSATE SODIUM 50 MG/5ML PO LIQD
100.0000 mg | Freq: Every day | ORAL | Status: DC | PRN
  Administered 2020-02-27: 100 mg
  Filled 2020-02-19: qty 10

## 2020-02-19 MED ORDER — ETOMIDATE 2 MG/ML IV SOLN: 40.0000 mg | Freq: Once | INTRAVENOUS | Status: DC

## 2020-02-19 MED ORDER — POLYETHYLENE GLYCOL 3350 17 G PO PACK
17.0000 g | PACK | Freq: Every day | ORAL | Status: DC | PRN
Start: 2020-02-19 — End: 2020-02-19

## 2020-02-19 MED ORDER — POTASSIUM & SODIUM PHOSPHATES 280-160-250 MG PO PACK
1.0000 | PACK | Freq: Three times a day (TID) | ORAL | Status: AC
  Administered 2020-02-19 (×3): 1
  Filled 2020-02-19 (×3): qty 1

## 2020-02-19 MED ORDER — PROPOFOL 10 MG/ML IV BOLUS: 500.0000 mg | Freq: Once | INTRAVENOUS | Status: DC

## 2020-02-19 MED ORDER — POLYETHYLENE GLYCOL 3350 17 G PO PACK: 17.0000 g | PACK | Freq: Every day | ORAL | Status: DC | PRN

## 2020-02-19 MED ORDER — VITAL 1.5 CAL PO LIQD
1000.0000 mL | ORAL | Status: DC
  Administered 2020-02-19 – 2020-02-27 (×12): 1000 mL
  Filled 2020-02-19 (×10): qty 1000

## 2020-02-19 MED ORDER — FENTANYL CITRATE (PF) 100 MCG/2ML IJ SOLN: 200.0000 ug | Freq: Once | INTRAMUSCULAR | Status: DC

## 2020-02-19 MED FILL — Medication: Qty: 1 | Status: AC

## 2020-02-19 NOTE — Procedures (Signed)
Tracheostomy Note  Patient Details:   Name: Belinda [name] DOB: 09-Jan-1981 MRN: [id number]    Airway Documentation:  Lurline Idol performed by MD. #6 shiley cuffed placed. Trach secured by trach tie. Placement verified with bronchoscope.    Evaluation  O2 sats: stable throughout Complications: No apparent complications Patient did tolerate procedure well. Bilateral Breath Sounds: Diminished    Ander Purpura 02/19/2020, 4:01 PM

## 2020-02-19 NOTE — Procedures (Signed)
Percutaneous Tracheostomy Procedure Note   Belinda [name]  [id number]  11-10-1980  Date:02/19/20  Time:4:07 PM   Provider Performing:Tarl Cephas C Tamala Julian  Procedure: Percutaneous Tracheostomy with Bronchoscopic Guidance (07218)  Indication(s) Persistent resp failure  Consent Risks of the procedure as well as the alternatives and risks of each were explained to the patient and/or caregiver.  Consent for the procedure was obtained.  Anesthesia Etomidate, Versed, Fentanyl, Vecuronium   Time Out Verified patient identification, verified procedure, site/side was marked, verified correct patient position, special equipment/implants available, medications/allergies/relevant history reviewed, required imaging and test results available.   Sterile Technique Maximal sterile technique including sterile barrier drape, hand hygiene, sterile gown, sterile gloves, mask, hair covering.    Procedure Description Appropriate anatomy identified by palpation.  Patient's neck prepped and draped in sterile fashion.  1% lidocaine with epinephrine was used to anesthetize skin overlying neck.  1.5cm incision made and blunt dissection performed until tracheal rings could be easily palpated.   Then a size 6-0 Shiley tracheostomy was placed under bronchoscopic visualization using usual Seldinger technique and serial dilation.   Bronchoscope confirmed placement above the carina.  Tracheostomy was sutured in place with adhesive pad to protect skin under pressure.    Patient connected to ventilator.   Complications/Tolerance None; patient tolerated the procedure well. Chest X-ray is ordered to confirm no post-procedural complication.   EBL Minimal   Specimen(s) None

## 2020-02-19 NOTE — Progress Notes (Signed)
Nutrition Follow-up  DOCUMENTATION CODES:   Morbid obesity  INTERVENTION:   Tube Feeding via Cortrak: Increase Vital 1.5 to 65 ml/hr Pro-Source TF 45 mL TID Provides 138 g of protein, 2460 kcals and 1185 mL of free water  Rectal tube in placed with liquid stool; recommend discontinuing scheduled bowel regimen at this time and changing to PRN  NUTRITION DIAGNOSIS:   Inadequate oral intake related to acute illness as evidenced by NPO status.  Being addressed via TF   GOAL:   Patient will meet greater than or equal to 90% of their needs  Met via TF   MONITOR:   Vent status,TF tolerance,Weight trends,Labs  REASON FOR ASSESSMENT:   Consult,Ventilator Enteral/tube feeding initiation and management  ASSESSMENT:   39 yo female admitted with severe COVID-19 pneumonia with ARDS requiring intubation. No PMH other than childhood asthma, anxiety  12/15 [facility] ER with ARDS, COVID+ 12/16 Transferred to [facility] 12/17 Cortrak placed, Intubated 12/26 New info obtained: pt on suboxone with hx of opioid OD in 2018  Plan for trach today, TF on hold for procedure Pt remains on vent support, sedated and paralyzed  Previously tolerating Vital 1.5 at 55 ml/hr via Cortrak  Free water flushes no longer ordered  No skin breakdown per RN skin assessment  Pt with liquid stool per rectal tube; noted bag appeared almost full on rounds today. Pt with scheduled miralax and colace with she continues to receive daily per [facility]; recommend changing to PRN  Admit weight 113.4 kg; current wt 99.9 kg. Lowest wt 97.5 kg. Net negative 10 L per I/O flow sheet. Weight loss likely combination of fluid losses and actual loss of muscle/fat mass  Labs: sodium 134, phosphorus 2.4 Meds:  lasix, ss novolog, novolog q 4 hours, levemir, zinc sulfate, Vitamin C, colace, miralax, potassium chloride, phosphate   Diet Order:   Diet Order            Diet NPO time specified  Diet effective midnight            Diet NPO time specified  Diet effective now                 EDUCATION NEEDS:   Not appropriate for education at this time  Skin:  Skin Assessment: Reviewed RN Assessment  Last BM:  12/28 rectal tube  Height:   Ht Readings from Last 1 Encounters:  02/06/20 5' 3"  (1.6 m)    Weight:   Wt Readings from Last 1 Encounters:  02/19/20 99.9 kg     BMI:  Body mass index is 39.01 kg/m.  Estimated Nutritional Needs:   Kcal:  2620-3559 kcals  Protein:  115-130 g  Fluid:  >/= 2 L   Kerman Passey MS, RDN, LDN, CNSC Registered Dietitian III Clinical Nutrition RD Pager and On-Call Pager Number Located in [place]

## 2020-02-19 NOTE — Progress Notes (Signed)
NAME:  Belinda [name], MRN:  [id number], DOB:  11/16/1980, LOS: 24 ADMISSION DATE:  02/06/2020, CONSULTATION DATE:  12/16 REFERRING MD:  Josephine Cables ([facility]), CHIEF COMPLAINT:  Acute metabolic encephalopathy and respiratory failure in setting of COVID   BRIEF  39 year old white female, only medical history is obesity.  Presented to [facility] ER 12/15 w/ ARDS and acute metabolic encephalopathy 2/2 COVID  Past Medical History:  Obesity, anxiety, childhood asthma   (new info 12/26  - patient on suboxone and has hx of opioid OD in 2018) and hx of regular neb for [facility] Events:  12/15 presented to the emergency room, hypoxic, encephalopathic, Covid positive.CT brain negative for acute injury with only some mild para sinus mucosal thickening with some periodontal disease.  Started on supplemental oxygen, IV Solu-Medrol, IV remdesivir, and baricitinib.  Also started empirically on cefepime  and vancomycin to cover for potential bacterial pneumonia 12/16 multiple reports of worsening confusion, intermittent combativeness, attempts to bite staff.  Not able to take oral medications.  Started on Precedex.  Transferred to [facility]. Changed abx to unasyn, added low dose ativan as pt chronically on xanax.  12/17: Patient more hypoxic, requiring more sedation. Decision made to proceed with intubation.  Intubated, left IJ catheter placed.  PF ratio only 88.  Core track ordered.  Neuromuscular blockade initiated.  Prone protocol initiated.  12/19 - No overnight events but this morning hypertensive and tachycardic. EKG personally reviewed, sinus tachycardia with normal axis. BIS values are low in the 30s. She is still on continuous paralytics.    12/20 -neuromuscular blockade discontinued yesterday.  At this point in time patient is extremely agitated on the ventilator despite Dilaudid infusion Versed infusion, oxycodone schedule, clonopoin scheduloed,  and phenobarb once daily at night.   She is on prednisone and Barcitinib. On vent 50%. AFebrikle  12/21  - 12/21 - cxr horrible. 40% fio2, pulse ox 90%,.  On dilaudid gtt, oxycdone po, precedex gtt, versed gt and klonopin,  On phenobarb QHS x 2 night.  On haldol schedulesd x 1 dayt.   -. RASS -4 but easily goes to +2 per RN. On TF.  K 2.9 while on 20mkcl daily. + 4L volume overloa  12/22 - 40% fio2. On dilaudid gtt, [precedex gtt, versed gtt. On oxy and klonopin. Still very agitated On TF. -2L since admit   12/23 -> stil with significant intermittent agitation. On 30% fio2, Now on diprivan gtt, precedex gtt, fent gtt, versed gtt, levophed gtt. On vent, 30% fio2  12/24   - ? Low grade fever. 30% fio2 on vent. On fent gtt, prededex gtt, ketamine gtt, versed gtt . Also on levophed gtt. Some breakthrough agitation per RN but much better overall. Off diprivan gtt   12/26 -  On fent gtt, precedex gtt, ketamnie gtt, versed gtt. On TF. On free water.  Low grade fever + . On vent  - 30   Consults:  Not applicable  Procedures:  Intubation 12/17 Left IJ triple-lumen catheter 12/17   Significant Diagnostic Tests:  CT brain 12/15: Negative for acute CVA or acute process Head CT 12/26 >> nothing acute  Micro Data:  MRSA PCR 12/15 urine culture>> 80,000 colonies GNR ->Klebsiella (resistant to ampi but sensitive to unasyn) Blood culture 12/15>>> Respiratory panel by RT-PCR 12/15: Positive for Covid xxxxx Trach aspirate 12/25 - staph aureus nos   Antimicrobials:  Remdesivir 12/15 Cefepime 12/15-->12/16 Vancomycin 12/15-->12/16 unasyn 12/16 (PCT 25, klbe uti) >>>12/20 xxx covid  Rx  - barcitinib - endds 12/30 or 12/29 - prednisone - ended 12/24 xxx Vanc 12/26 >> 12/27 Cefazolin 12/27 >>    Interim History / Subjective:   Received her enoxaparin last pm 22:00 Remains paralyzed   Objective   Blood pressure 130/63, pulse (!) 101, temperature 99.9 F (37.7 C), temperature source Axillary, resp. rate (!) 28, height  5' 3"  (1.6 m), weight 99.9 kg, SpO2 94 %.    Vent Mode: PRVC FiO2 (%):  [50 %] 50 % Set Rate:  [28 bmp] 28 bmp Vt Set:  [320 mL] 320 mL PEEP:  [8 cmH20] 8 cmH20 Plateau Pressure:  [20 cmH20-22 cmH20] 20 cmH20   Intake/Output Summary (Last 24 hours) at 02/19/2020 0913 Last data filed at 02/19/2020 0230 Gross per 24 hour  Intake 2112.55 ml  Output 285 ml  Net 1827.55 ml   Filed Weights   02/17/20 0158 02/18/20 0339 02/19/20 0500  Weight: 100.3 kg 97.5 kg 99.9 kg    General Appearance: Critically ill, ventilated, obese HEENT: ET tube in place, OG tube in place.  Pupils equal, react to light Lungs: Small breaths.  Bibasilar inspiratory crackles, no wheezing Heart: Distant, regular, no murmur Abdomen: Nondistended.  Hypoactive bowel sounds Extremities: 1+ lower extremity edema Skin: No rash Neurologic: Paralyzed   Resolved Hospital Problem list    Mild elevated LFTs - transamints 02/15/2020 -resolved  Assessment & Plan:  Baseline asthma nos hx Acute hypoxic respiratory failure in setting of Covid pneumonia, and ARDS, plus minus CAP versus aspiration  Some improved gas exchange through the hospitalization.  Largest barrier to improvement currently is her severe agitated delirium Plan -Continue current PRVC.  She has required paralytics both for agitation also vent synchrony.  Not weaned off 12/27.  Will try to aggressively come off paralytics after planned tracheostomy today 12/28 -Tracheostomy 12/28, discussed with Dr. Tamala Julian, discussed with family.  Tube feeds on hold.  She got enoxaparin 12/27 at 2200 -Continue bronchodilators as ordered, no role for steroids at this time -VAP prevention orders  COVID-19   - s/p remedsivir  - sp steroids ended 12/24  plan -Continue baricitinib, plan stop date 12/30 -Lower extremity Doppler ultrasound negative 12/26   Klebsiella urinary tract infection with sepsis syndrome with or without aspiration pneumonia - unasyn ended  02/11/20 Staph NOS VAP on 12/26 Plan -MSSA on respiratory culture, continue cefazolin, plan for 7 days total (day 3 antibiotics)   Chronic xanax gtt (new info 12/26  - patient on suboxone and has hx of opioid OD in 1638) Acute metabolic encephalopathy in the setting of sepsis and Covid infection.  Chronically Xanax dependent  Agitated with dyssynchrony almost to the point of coding on 12/26  Plan -Continue scheduled methadone, scheduled Haldol, scheduled Dilaudid and clonazepam -Now off propofol and ketamine -Remains on fentanyl and Versed infusions -Remains on Nimbex infusion, goal transition to off post tracheostomy -RF slightly elevated 18.2, ESR 112, complements elevated.  Significance unclear.  Question whether there may be role for steroids going forward depending on encephalopathy    Total body Volume overload ECHO normal 02/12/20.  Plan -Continue current Lasix daily and follow I/O.  Currently net -11 L total  Hypophosphatemia -Replace 12/28  Anemia of critical illness Plan -Follow CBC -Goal hemoglobin 7.0  Hyperglycemia Plan -Continue resistant sliding-scale insulin coverage  -Continue Levemir 10 units twice daily    Best practice (evaluated daily)  Diet: Tube feeding   Pain/Anxiety/Delirium protocol (if indicated): Complicated enteral and IV regimen, see above  VAP protocol (if indicated): 12/17  DVT prophylaxis: Lovenox  GI prophylaxis: PPI  Glucose control: Sliding scale insulin plus Levemir  Mobility: Bedrest currently requiring restraints  Disposition: Intensive care  Family:  DAd Donnie Hollinghead [phone] -> spoke with him by phone 12/28. Understands plan for trach on 12/28   Goals of Care:  Last date of multidisciplinary goals of care discussion: via phone so not able to do multidisciplinary meeting  Family and staff present: NP and mother dianne  Summary of discussion: Full code  Follow up goals of care discussion due: Goals of care  discussion due 12/23   Code Status: Full code    [place]   The patient LARRIE LUCIA is critically ill with multiple organ systems failure and requires high complexity decision making for assessment and support, frequent evaluation and titration of therapies, application of advanced monitoring technologies and extensive interpretation of multiple databases.   Independent CC time 33 minutes Baltazar Apo, MD, PhD 02/19/2020, 9:13 AM [facility] [phone] or if no answer [phone]   LABS    PULMONARY Recent Labs  Lab 02/15/20 0459 02/17/20 1114 02/17/20 1730 02/17/20 2128  PHART 7.444 7.350 7.416 7.344*  PCO2ART 42.1 55.6* 43.7 56.7*  PO2ART 58.3* 285* 86 107  HCO3 28.3* 30.3* 28.0 30.7*  TCO2  --  32 29 32  O2SAT 87.6 100.0 96.0 98.0    CBC Recent Labs  Lab 02/17/20 2012 02/17/20 2128 02/18/20 0332 02/19/20 0233  HGB 10.7* 10.9* 11.1* 9.9*  HCT 31.9* 32.0* 35.0* 29.4*  WBC 12.6*  --  14.0* 14.2*  PLT 322  --  328 284    COAGULATION No results for input(s): INR in the last 168 hours.  CARDIAC  No results for input(s): TROPONINI in the last 168 hours. No results for input(s): PROBNP in the last 168 hours.   CHEMISTRY Recent Labs  Lab 02/15/20 0435 02/16/20 0421 02/17/20 0348 02/17/20 1114 02/17/20 1730 02/17/20 2012 02/17/20 2128 02/18/20 0332 02/19/20 0233  NA 136 134* 134*   < > 137 131* 132* 133* 134*  K 4.2 4.6 4.4   < > 3.7 4.2 4.2 4.4 4.5  CL 100 99 99  --   --  95*  --  93* 98  CO2 27 25 26   --   --  25  --  27 30  GLUCOSE 93 90 135*  --   --  263*  --  190* 169*  BUN 15 18 15   --   --  12  --  12 12  CREATININE 0.52 0.58 0.60  --   --  0.67  --  0.65 0.55  CALCIUM 8.0* 8.0* 7.9*  --   --  8.0*  --  8.2* 8.2*  MG 2.4 2.5* 2.4  --   --  2.0  --   --  2.3  PHOS 5.0* 4.3 3.7  --   --  3.4  --   --  2.4*   < > = values in this interval not displayed.   Estimated Creatinine Clearance: 107.5  mL/min (by C-G formula based on SCr of 0.55 mg/dL).   LIVER Recent Labs  Lab 02/15/20 0435 02/16/20 0421 02/17/20 0348 02/17/20 2012 02/18/20 0332  AST 31 37 29 60* 40  ALT 48* 54* 46* 79* 78*  ALKPHOS 50 60 54 85 93  BILITOT 0.5 0.5 0.6 0.8 0.8  PROT 5.6* 6.0* 5.6* 7.0 7.2  ALBUMIN 2.4* 2.6*  2.4* 2.8* 2.8*     INFECTIOUS Recent Labs  Lab 02/15/20 0325 02/16/20 1352 02/17/20 0348 02/17/20 2012 02/18/20 0332  LATICACIDVEN 0.7  --   --  0.8 0.8  PROCALCITON  --  <0.10 <0.10  --  0.27     ENDOCRINE CBG (last 3)  Recent Labs    02/18/20 2328 02/19/20 0338 02/19/20 0729  GLUCAP 172* 153* 165*

## 2020-02-19 NOTE — Procedures (Signed)
Diagnostic Bronchoscopy  Belinda [name]  [id number]  20-Aug-1980  Date:02/19/20  Time:4:00 PM   Provider Performing:Daniyal Tabor S Loyce Flaming   Procedure: Diagnostic Bronchoscopy (56389)  Indication(s) Assist with direct visualization of tracheostomy placement  Consent Risks of the procedure as well as the alternatives and risks of each were explained to the patient and/or caregiver.  Consent for the procedure was obtained.   Anesthesia See separate tracheostomy note   Time Out Verified patient identification, verified procedure, site/side was marked, verified correct patient position, special equipment/implants available, medications/allergies/relevant history reviewed, required imaging and test results available.   Sterile Technique Usual hand hygiene, masks, gowns, and gloves were used   Procedure Description Bronchoscope advanced through endotracheal tube and into airway.  After suctioning out tracheal secretions, bronchoscope used to provide direct visualization of tracheostomy placement.  Direct visualization of needle and guidewire insertion into the trachea, no impact on the posterior wall.  Inspected both proximal to the tracheostomy and through the tracheostomy itself after placed.  No significant bleeding noted.   Complications/Tolerance None; patient tolerated the procedure well.   EBL None  Specimen(s) None  Baltazar Apo, MD, PhD 02/19/2020, 4:02 PM [facility] [phone] or if no answer [phone]

## 2020-02-20 ENCOUNTER — Inpatient Hospital Stay (HOSPITAL_COMMUNITY): Payer: Medicaid Other

## 2020-02-20 DIAGNOSIS — J9601 Acute respiratory failure with hypoxia: Secondary | ICD-10-CM | POA: Diagnosis not present

## 2020-02-20 DIAGNOSIS — G9341 Metabolic encephalopathy: Secondary | ICD-10-CM | POA: Diagnosis not present

## 2020-02-20 DIAGNOSIS — J1282 Pneumonia due to coronavirus disease 2019: Secondary | ICD-10-CM | POA: Diagnosis not present

## 2020-02-20 DIAGNOSIS — U071 COVID-19: Secondary | ICD-10-CM | POA: Diagnosis not present

## 2020-02-20 DIAGNOSIS — R918 Other nonspecific abnormal finding of lung field: Secondary | ICD-10-CM | POA: Diagnosis not present

## 2020-02-20 DIAGNOSIS — J80 Acute respiratory distress syndrome: Secondary | ICD-10-CM | POA: Diagnosis not present

## 2020-02-20 DIAGNOSIS — J9600 Acute respiratory failure, unspecified whether with hypoxia or hypercapnia: Secondary | ICD-10-CM | POA: Diagnosis not present

## 2020-02-20 LAB — BASIC METABOLIC PANEL
Anion gap: 13 (ref 5–15)
BUN: 19 mg/dL (ref 6–20)
CO2: 28 mmol/L (ref 22–32)
Calcium: 8.4 mg/dL — ABNORMAL LOW (ref 8.9–10.3)
Chloride: 95 mmol/L — ABNORMAL LOW (ref 98–111)
Creatinine, Ser: 0.57 mg/dL (ref 0.44–1.00)
GFR, Estimated: >60 mL/min (ref >60)
Glucose, Bld: 126 mg/dL — ABNORMAL HIGH (ref 70–99)
Potassium: 4.4 mmol/L (ref 3.5–5.1)
Sodium: 136 mmol/L (ref 135–145)

## 2020-02-20 LAB — CBC
HCT: 29.1 % — ABNORMAL LOW (ref 36.0–46.0)
Hemoglobin: 9.6 g/dL — ABNORMAL LOW (ref 12.0–15.0)
MCH: 31.4 pg (ref 26.0–34.0)
MCHC: 33 g/dL (ref 30.0–36.0)
MCV: 95.1 fL (ref 80.0–100.0)
Platelets: 284 10*3/uL (ref 150–400)
RBC: 3.06 MIL/uL — ABNORMAL LOW (ref 3.87–5.11)
RDW: 13.8 % (ref 11.5–15.5)
WBC: 11.7 10*3/uL — ABNORMAL HIGH (ref 4.0–10.5)
nRBC: 0 % (ref 0.0–0.2)

## 2020-02-20 LAB — GLUCOSE, CAPILLARY
Glucose-Capillary: 107 mg/dL — ABNORMAL HIGH (ref 70–99)
Glucose-Capillary: 118 mg/dL — ABNORMAL HIGH (ref 70–99)
Glucose-Capillary: 132 mg/dL — ABNORMAL HIGH (ref 70–99)
Glucose-Capillary: 243 mg/dL — ABNORMAL HIGH (ref 70–99)
Glucose-Capillary: 57 mg/dL — ABNORMAL LOW (ref 70–99)
Glucose-Capillary: 96 mg/dL (ref 70–99)
Glucose-Capillary: 99 mg/dL (ref 70–99)

## 2020-02-20 LAB — PHOSPHORUS: Phosphorus: 3.8 mg/dL (ref 2.5–4.6)

## 2020-02-20 LAB — TRIGLYCERIDES: Triglycerides: 182 mg/dL — ABNORMAL HIGH (ref <150)

## 2020-02-20 LAB — MAGNESIUM: Magnesium: 2.2 mg/dL (ref 1.7–2.4)

## 2020-02-20 MED ORDER — FENTANYL CITRATE (PF) 100 MCG/2ML IJ SOLN: 50.0000 ug | INTRAMUSCULAR | Status: DC | PRN

## 2020-02-20 MED ORDER — FENTANYL CITRATE (PF) 100 MCG/2ML IJ SOLN
50.0000 ug | INTRAMUSCULAR | Status: DC | PRN
Start: 2020-02-20 — End: 2020-03-02
  Administered 2020-02-21 – 2020-02-22 (×2): 100 ug via INTRAVENOUS

## 2020-02-20 MED ORDER — PROPOFOL 1000 MG/100ML IV EMUL: 5.0000 ug/kg/min | INTRAVENOUS | Status: DC

## 2020-02-20 MED ORDER — POLYETHYLENE GLYCOL 3350 17 G PO PACK
17.0000 g | PACK | Freq: Every day | ORAL | Status: DC | PRN
  Administered 2020-02-28: 17 g
  Filled 2020-02-20: qty 1

## 2020-02-20 MED ORDER — INSULIN ASPART 100 UNIT/ML ~~LOC~~ SOLN
5.0000 [IU] | SUBCUTANEOUS | Status: DC
  Administered 2020-02-20 – 2020-02-28 (×38): 5 [IU] via SUBCUTANEOUS

## 2020-02-20 MED ORDER — METOPROLOL TARTRATE 5 MG/5ML IV SOLN
5.0000 mg | Freq: Once | INTRAVENOUS | Status: AC
  Administered 2020-02-20: 5 mg via INTRAVENOUS
  Filled 2020-02-20: qty 5

## 2020-02-20 NOTE — Progress Notes (Signed)
SLP Cancellation Note  Patient Details Name: Belinda [name] MRN: [id number] DOB: 05-29-1980   Cancelled treatment:       Reason Eval/Treat Not Completed: Medical issues which prohibited therapy (SLP consulted for PMV and swallow evaluations. Case d/w with Wilburn Cornelia, RN who indicated that the pt has been agitated today and has had "copious" secretions. It was agreed that evaluation(s) be deferred today.)  Brayla Pat I. Hardin Negus, [place], [place] Office number [phone] Pager [place] 02/20/2020, 2:49 PM

## 2020-02-20 NOTE — Progress Notes (Signed)
NAME:  Belinda [name], MRN:  [id number], DOB:  1980-08-30, LOS: 99 ADMISSION DATE:  02/06/2020, CONSULTATION DATE:  12/16 REFERRING MD:  Josephine Cables ([facility]), CHIEF COMPLAINT:  Acute metabolic encephalopathy and respiratory failure in setting of COVID   BRIEF  39 year old white female, only medical history is obesity.  Presented to [facility] ER 12/15 w/ ARDS and acute metabolic encephalopathy 2/2 COVID  Past Medical History:  Obesity, anxiety, childhood asthma   (new info 12/26  - patient on suboxone and has hx of opioid OD in 2018) and hx of regular neb for [facility] Events:  12/15 presented to the emergency room, hypoxic, encephalopathic, Covid positive.CT brain negative for acute injury with only some mild para sinus mucosal thickening with some periodontal disease.  Started on supplemental oxygen, IV Solu-Medrol, IV remdesivir, and baricitinib.  Also started empirically on cefepime  and vancomycin to cover for potential bacterial pneumonia 12/16 multiple reports of worsening confusion, intermittent combativeness, attempts to bite staff.  Not able to take oral medications.  Started on Precedex.  Transferred to [facility]. Changed abx to unasyn, added low dose ativan as pt chronically on xanax.  12/17: Patient more hypoxic, requiring more sedation. Decision made to proceed with intubation.  Intubated, left IJ catheter placed.  PF ratio only 88.  Core track ordered.  Neuromuscular blockade initiated.  Prone protocol initiated.  12/19 - No overnight events but this morning hypertensive and tachycardic. EKG personally reviewed, sinus tachycardia with normal axis. BIS values are low in the 30s. She is still on continuous paralytics.    12/20 -neuromuscular blockade discontinued yesterday.  At this point in time patient is extremely agitated on the ventilator despite Dilaudid infusion Versed infusion, oxycodone schedule, clonopoin scheduloed,  and phenobarb once daily at night.   She is on prednisone and Barcitinib. On vent 50%. AFebrikle  12/21  - 12/21 - cxr horrible. 40% fio2, pulse ox 90%,.  On dilaudid gtt, oxycdone po, precedex gtt, versed gt and klonopin,  On phenobarb QHS x 2 night.  On haldol schedulesd x 1 dayt.   -. RASS -4 but easily goes to +2 per RN. On TF.  K 2.9 while on 42mkcl daily. + 4L volume overloa  12/22 - 40% fio2. On dilaudid gtt, [precedex gtt, versed gtt. On oxy and klonopin. Still very agitated On TF. -2L since admit   12/23 -> stil with significant intermittent agitation. On 30% fio2, Now on diprivan gtt, precedex gtt, fent gtt, versed gtt, levophed gtt. On vent, 30% fio2  12/24   - ? Low grade fever. 30% fio2 on vent. On fent gtt, prededex gtt, ketamine gtt, versed gtt . Also on levophed gtt. Some breakthrough agitation per RN but much better overall. Off diprivan gtt   12/26 -  On fent gtt, precedex gtt, ketamnie gtt, versed gtt. On TF. On free water.  Low grade fever + . On vent  - 30   12/28 Tracheostomy-paralytics off   Consults:    Procedures:  Intubation 12/17 > 12/28 Left IJ triple-lumen catheter 12/17 Trach (DS) 12/28 >>    Significant Diagnostic Tests:  CT brain 12/15: Negative for acute CVA or acute process Head CT 12/26 >> nothing acute  Micro Data:  MRSA PCR 12/15 urine culture>> 80,000 colonies GNR ->Klebsiella (resistant to ampi but sensitive to unasyn) Blood culture 12/15>>> Respiratory panel by RT-PCR 12/15: Positive for Covid xxxxx Trach aspirate 12/25 - staph aureus nos Trach aspirate 12/29-   Antimicrobials:  Remdesivir  12/15 Cefepime 12/15-->12/16 Vancomycin 12/15-->12/16 unasyn 12/16 (PCT 25, klbe uti) >>>12/20 xxx covid Rx  - barcitinib - endds 12/30 or 12/29 - prednisone - ended 12/24 xxx Vanc 12/26 >> 12/27 Cefazolin 12/27 >>    Interim History / Subjective:  Trach placed 12/28 Agitation this AM on Versed and Fentanyl   Objective   Blood pressure (!) 175/91, pulse (!) 105,  temperature 99.3 F (37.4 C), temperature source Oral, resp. rate (!) 28, height 5' 3"  (1.6 m), weight 98.2 kg, SpO2 93 %.    Vent Mode: PRVC FiO2 (%):  [50 %-60 %] 60 % Set Rate:  [28 bmp] 28 bmp Vt Set:  [320 mL] 320 mL PEEP:  [8 cmH20] 8 cmH20 Plateau Pressure:  [20 [id number] cmH20] 21 cmH20   Intake/Output Summary (Last 24 hours) at 02/20/2020 0737 Last data filed at 02/20/2020 3762 Gross per 24 hour  Intake 2105.87 ml  Output 660 ml  Net 1445.87 ml   Filed Weights   02/18/20 0339 02/19/20 0500 02/20/20 0500  Weight: 97.5 kg 99.9 kg 98.2 kg   General Appearance: Critically ill-appearing, coughing, trach in place on full vent support HEENT: new trach with minimal bleeding , Pupils equal, react to light Lungs: mechanical breath sounds, copious respiratory secretions Heart: Distant, regular, no murmur Abdomen: Nondistended, non-tender Extremities: 1+ lower extremity edema Skin: No rash Neurologic: awake, agitated this morning, though following commands and nodding to questions   CXR 12/29- trach in place, improved B lung volumes, persistent basilar atx/infil   Resolved Hospital Problem list    Mild elevated LFTs - transamints 02/15/2020 -resolved  Assessment & Plan:     Acute hypoxic respiratory failure in setting of Covid pneumonia, and ARDS, plus minus CAP versus aspiration  Baseline asthma nos hx Tracheostomy 83/15 without complication Plan: -Pt awake and tolerating PS better than PRVC this AM, requiring 60% FiO2. -off paralytics and awake on Fentanyl 362mg and Versed 131m intermittently agitated though following commands, attempt not to up-titrate sedation if able -copious white secretions today, check repeat respiratory culture  -Continue bronchodilators  -VAP prevention orders  COVID-19   - s/p remedsivir  - sp steroids ended 12/24 plan -Continue baricitinib, plan stop date 12/30 -Lower extremity Doppler ultrasound negative 12/26   Klebsiella  urinary tract infection with sepsis syndrome with or without aspiration pneumonia - unasyn ended 02/11/20 Staph NOS VAP on 12/26 Plan -MSSA on respiratory culture, continue cefazolin, plan for 7 days total (day 3 antibiotics)   Acute metabolic encephalopathy Chronic xanax and on suboxone and has hx of opioid OD in 2018, in the setting of sepsis and Covid infection.   Agitated with dyssynchrony almost to the point of coding on 12/26 Plan -agitation remains an issue, though now off paralytic and following commands -Continue scheduled methadone, scheduled Haldol, scheduled Dilaudid and clonazepam -Now off propofol and ketamine -Remains on fentanyl and Versed infusions     Total body Volume overload ECHO normal 02/12/20.  Plan -Continue current Lasix daily and follow I/O.  --9L today  Hypophosphatemia -Replaced 12/28  Anemia of critical illness Stable Plan -Follow CBC -Goal hemoglobin >7.0  Hyperglycemia Plan -Continue resistant sliding-scale insulin coverage -Continue Levemir 10 units twice daily    Best practice (evaluated daily)  Diet: Tube feeding   Pain/Anxiety/Delirium protocol (if indicated): Fentanyl and Versed  VAP protocol (if indicated): yes, intiated 12/17  DVT prophylaxis: Lovenox  GI prophylaxis: PPI  Glucose control: Sliding scale insulin plus Levemir  Mobility: Bedrest currently requiring restraints  Disposition:  Intensive care  Family:  DAd Donnie Hollinghead [phone] -> updated 12/29  Goals of Care:  Last date of multidisciplinary goals of care discussion: via phone so not able to do multidisciplinary meeting  Family and staff present: NP and mother dianne  Summary of discussion: Full code  Follow up goals of care discussion due: Goals of care discussion done 12/28 with consent for trach   Code Status: Full code       [place]  Lab 02/15/20 0459 02/17/20 1114 02/17/20 1730 02/17/20 2128  PHART  7.444 7.350 7.416 7.344*  PCO2ART 42.1 55.6* 43.7 56.7*  PO2ART 58.3* 285* 86 107  HCO3 28.3* 30.3* 28.0 30.7*  TCO2  --  32 29 32  O2SAT 87.6 100.0 96.0 98.0    CBC Recent Labs  Lab 02/18/20 0332 02/19/20 0233 02/20/20 0332  HGB 11.1* 9.9* 9.6*  HCT 35.0* 29.4* 29.1*  WBC 14.0* 14.2* 11.7*  PLT 328 284 284    COAGULATION No results for input(s): INR in the last 168 hours.  CARDIAC  No results for input(s): TROPONINI in the last 168 hours. No results for input(s): PROBNP in the last 168 hours.   CHEMISTRY Recent Labs  Lab 02/16/20 0421 02/17/20 0348 02/17/20 1114 02/17/20 2012 02/17/20 2128 02/18/20 0332 02/19/20 0233 02/20/20 0332  NA 134* 134*   < > 131* 132* 133* 134* 136  K 4.6 4.4   < > 4.2 4.2 4.4 4.5 4.4  CL 99 99  --  95*  --  93* 98 95*  CO2 25 26  --  25  --  27 30 28   GLUCOSE 90 135*  --  263*  --  190* 169* 126*  BUN 18 15  --  12  --  12 12 19   CREATININE 0.58 0.60  --  0.67  --  0.65 0.55 0.57  CALCIUM 8.0* 7.9*  --  8.0*  --  8.2* 8.2* 8.4*  MG 2.5* 2.4  --  2.0  --   --  2.3 2.2  PHOS 4.3 3.7  --  3.4  --   --  2.4* 3.8   < > = values in this interval not displayed.   Estimated Creatinine Clearance: 106.4 mL/min (by C-G formula based on SCr of 0.57 mg/dL).   LIVER Recent Labs  Lab 02/15/20 0435 02/16/20 0421 02/17/20 0348 02/17/20 2012 02/18/20 0332  AST 31 37 29 60* 40  ALT 48* 54* 46* 79* 78*  ALKPHOS 50 60 54 85 93  BILITOT 0.5 0.5 0.6 0.8 0.8  PROT 5.6* 6.0* 5.6* 7.0 7.2  ALBUMIN 2.4* 2.6* 2.4* 2.8* 2.8*     INFECTIOUS Recent Labs  Lab 02/15/20 0325 02/16/20 1352 02/17/20 0348 02/17/20 2012 02/18/20 0332  LATICACIDVEN 0.7  --   --  0.8 0.8  PROCALCITON  --  <0.10 <0.10  --  0.27     ENDOCRINE CBG (last 3)  Recent Labs    02/19/20 1954 02/19/20 2349 02/20/20 0328  GLUCAP 131* 112* 96    ATTESTATION & SIGNATURE   CRITICAL CARE Performed by: Otilio Carpen Markelle Asaro   Total critical care time: 35  minutes  Critical care time was exclusive of separately billable procedures and treating other patients.  Critical care was necessary to treat or prevent imminent or life-threatening deterioration.  Critical care was time spent personally by me on the following activities: development of treatment plan with patient and/or surrogate as well as  nursing, discussions with consultants, evaluation of patient's response to treatment, examination of patient, obtaining history from patient or surrogate, ordering and performing treatments and interventions, ordering and review of laboratory studies, ordering and review of radiographic studies, pulse oximetry and re-evaluation of patient's condition.   Otilio Carpen Sharron Petruska, PA-C [facility]  Pager# [phone], if no answer [phone]

## 2020-02-20 NOTE — Progress Notes (Signed)
[place] Progress Note Patient Name: NICOLETTE GIESKE DOB: 06-10-1980 MRN: [id number]   Date of Service  02/20/2020  HPI/Events of Note  Agitation  Pain - Currently on Fentanyl and Versed IV infusions at 300 mcg/hour and 12 mg/hour respectively.   eICU Interventions  Plan: 1. Fentanyl 50-100 mcg IV Q 1 hour PRN pain or agitation.  2. Titrate Fentanyl IV infusion as ordered.      Intervention Category Major Interventions: Delirium, psychosis, severe agitation - evaluation and management  Lysle Dingwall 02/20/2020, 9:31 PM

## 2020-02-20 NOTE — Progress Notes (Signed)
Inpatient Diabetes Program Recommendations  AACE/ADA: New Consensus Statement on Inpatient Glycemic Control (2015)  Target Ranges:  Prepandial:   less than 140 mg/dL      Peak postprandial:   less than 180 mg/dL (1-2 hours)      Critically ill patients:  140 - 180 mg/dL   Lab Results  Component Value Date   GLUCAP 57 (L) 02/20/2020   HGBA1C 5.8 (H) 02/08/2020    Review of Glycemic Control Results for Belinda, [name] (MRN [id number]) as of 02/20/2020 12:21  Ref. Range 02/20/2020 03:28 02/20/2020 07:47 02/20/2020 12:11  Glucose-Capillary Latest Ref Range: 70 - 99 mg/dL 96 243 (H) 57 (L)    Noted hypoglycemic event this AM of 57 mg/dL. Consider reducing tube feed coverage to Novolog 8 units Q4H.   Thanks, Bronson Curb, MSN, RNC-OB Diabetes Coordinator [phone] (8a-5p)

## 2020-02-20 NOTE — Progress Notes (Signed)
PT Cancellation Note  Patient Details Name: Belinda [name] MRN: [id number] DOB: August 18, 1980   Cancelled Treatment:    Reason Eval/Treat Not Completed: Medical issues which prohibited therapy.  I spoke with RN, Belinda [name], who reports pt is not quite ready for PT today.  O2 sats trending down, tachy, vent settings trending up, pt looking uncomfortable.  PT will hold for today and follow along for more medical stability.   Thanks,  Verdene Lennert, PT, DPT  Acute Rehabilitation [phone] pager #[phone] office      Belinda [name] 02/20/2020, 11:13 AM

## 2020-02-21 DIAGNOSIS — G9341 Metabolic encephalopathy: Secondary | ICD-10-CM | POA: Diagnosis not present

## 2020-02-21 DIAGNOSIS — J9601 Acute respiratory failure with hypoxia: Secondary | ICD-10-CM | POA: Diagnosis not present

## 2020-02-21 DIAGNOSIS — J1282 Pneumonia due to coronavirus disease 2019: Secondary | ICD-10-CM | POA: Diagnosis not present

## 2020-02-21 DIAGNOSIS — U071 COVID-19: Secondary | ICD-10-CM | POA: Diagnosis not present

## 2020-02-21 DIAGNOSIS — J9600 Acute respiratory failure, unspecified whether with hypoxia or hypercapnia: Secondary | ICD-10-CM | POA: Diagnosis not present

## 2020-02-21 DIAGNOSIS — J80 Acute respiratory distress syndrome: Secondary | ICD-10-CM | POA: Diagnosis not present

## 2020-02-21 LAB — BASIC METABOLIC PANEL WITH GFR
Anion gap: 12 (ref 5–15)
BUN: 16 mg/dL (ref 6–20)
CO2: 29 mmol/L (ref 22–32)
Calcium: 8.7 mg/dL — ABNORMAL LOW (ref 8.9–10.3)
Chloride: 96 mmol/L — ABNORMAL LOW (ref 98–111)
Creatinine, Ser: 0.57 mg/dL (ref 0.44–1.00)
GFR, Estimated: >60 mL/min (ref >60)
Glucose, Bld: 135 mg/dL — ABNORMAL HIGH (ref 70–99)
Potassium: 4.1 mmol/L (ref 3.5–5.1)
Sodium: 137 mmol/L (ref 135–145)

## 2020-02-21 LAB — POCT I-STAT 7, (LYTES, BLD GAS, ICA,H+H)
Acid-Base Excess: 9 mmol/L — ABNORMAL HIGH (ref 0.0–2.0)
Bicarbonate: 34.1 mmol/L — ABNORMAL HIGH (ref 20.0–28.0)
Calcium, Ion: 1.17 mmol/L (ref 1.15–1.40)
HCT: 26 % — ABNORMAL LOW (ref 36.0–46.0)
Hemoglobin: 8.8 g/dL — ABNORMAL LOW (ref 12.0–15.0)
O2 Saturation: 98 %
Potassium: 4.1 mmol/L (ref 3.5–5.1)
Sodium: 136 mmol/L (ref 135–145)
TCO2: 36 mmol/L — ABNORMAL HIGH (ref 22–32)
pCO2 arterial: 51.5 mmHg — ABNORMAL HIGH (ref 32.0–48.0)
pH, Arterial: 7.429 (ref 7.350–7.450)
pO2, Arterial: 105 mmHg (ref 83.0–108.0)

## 2020-02-21 LAB — GLUCOSE, CAPILLARY
Glucose-Capillary: 111 mg/dL — ABNORMAL HIGH (ref 70–99)
Glucose-Capillary: 112 mg/dL — ABNORMAL HIGH (ref 70–99)
Glucose-Capillary: 117 mg/dL — ABNORMAL HIGH (ref 70–99)
Glucose-Capillary: 135 mg/dL — ABNORMAL HIGH (ref 70–99)
Glucose-Capillary: 136 mg/dL — ABNORMAL HIGH (ref 70–99)
Glucose-Capillary: 99 mg/dL (ref 70–99)

## 2020-02-21 LAB — CBC
HCT: 28.6 % — ABNORMAL LOW (ref 36.0–46.0)
Hemoglobin: 9.4 g/dL — ABNORMAL LOW (ref 12.0–15.0)
MCH: 31.1 pg (ref 26.0–34.0)
MCHC: 32.9 g/dL (ref 30.0–36.0)
MCV: 94.7 fL (ref 80.0–100.0)
Platelets: 282 K/uL (ref 150–400)
RBC: 3.02 MIL/uL — ABNORMAL LOW (ref 3.87–5.11)
RDW: 13.6 % (ref 11.5–15.5)
WBC: 10.5 K/uL (ref 4.0–10.5)
nRBC: 0.3 % — ABNORMAL HIGH (ref 0.0–0.2)

## 2020-02-21 LAB — PHOSPHORUS: Phosphorus: 4.5 mg/dL (ref 2.5–4.6)

## 2020-02-21 LAB — MAGNESIUM: Magnesium: 2.3 mg/dL (ref 1.7–2.4)

## 2020-02-21 MED ORDER — METHADONE HCL 10 MG PO TABS
20.0000 mg | ORAL_TABLET | Freq: Two times a day (BID) | ORAL | Status: DC
  Administered 2020-02-21 – 2020-02-28 (×16): 20 mg
  Filled 2020-02-21 (×6): qty 4
  Filled 2020-02-21: qty 2
  Filled 2020-02-21 (×5): qty 4
  Filled 2020-02-21: qty 2
  Filled 2020-02-21 (×4): qty 4

## 2020-02-21 MED ORDER — CLONAZEPAM 0.5 MG PO TABS
2.5000 mg | ORAL_TABLET | Freq: Two times a day (BID) | ORAL | Status: DC
  Administered 2020-02-21: 2.5 mg via ORAL
  Filled 2020-02-21 (×2): qty 2

## 2020-02-21 MED ORDER — SODIUM CHLORIDE 0.9 % IV SOLN
0.0000 mg/h | INTRAVENOUS | Status: DC
  Administered 2020-02-21: 12 mg/h via INTRAVENOUS
  Administered 2020-02-21: 5 mg/h via INTRAVENOUS
  Filled 2020-02-21 (×2): qty 20

## 2020-02-21 MED ORDER — CLONAZEPAM 0.5 MG PO TABS
2.5000 mg | ORAL_TABLET | Freq: Two times a day (BID) | ORAL | Status: DC
  Administered 2020-02-21 – 2020-02-28 (×15): 2.5 mg
  Filled 2020-02-21 (×8): qty 2
  Filled 2020-02-21: qty 5
  Filled 2020-02-21 (×5): qty 2

## 2020-02-21 NOTE — Progress Notes (Signed)
Wasted 250 mL Ketamine and 50 mL of Versed with Jenny Reichmann, Therapist, sports.    Antonieta Pert, RN  02/21/2020 [phone]

## 2020-02-21 NOTE — Progress Notes (Signed)
NAME:  Belinda [name], MRN:  [id number], DOB:  [birth year], LOS: 83 ADMISSION DATE:  02/06/2020, CONSULTATION DATE:  12/16 REFERRING MD:  Josephine Cables ([facility]), CHIEF COMPLAINT:  Acute metabolic encephalopathy and respiratory failure in setting of COVID   BRIEF  39 year old white female, only medical history is obesity.  Presented to [facility] ER 12/15 w/ ARDS and acute metabolic encephalopathy 2/2 COVID  Past Medical History:  Obesity, anxiety, childhood asthma   (new info 12/26  - patient on suboxone and has hx of opioid OD in 2018) and hx of regular neb for [facility] Events:  12/15 presented to the emergency room, hypoxic, encephalopathic, Covid positive.CT brain negative for acute injury with only some mild para sinus mucosal thickening with some periodontal disease.  Started on supplemental oxygen, IV Solu-Medrol, IV remdesivir, and baricitinib.  Also started empirically on cefepime  and vancomycin to cover for potential bacterial pneumonia 12/16 multiple reports of worsening confusion, intermittent combativeness, attempts to bite staff.  Not able to take oral medications.  Started on Precedex.  Transferred to [facility]. Changed abx to unasyn, added low dose ativan as pt chronically on xanax.  12/17: Patient more hypoxic, requiring more sedation. Decision made to proceed with intubation.  Intubated, left IJ catheter placed.  PF ratio only 88.  Core track ordered.  Neuromuscular blockade initiated.  Prone protocol initiated.  12/19 - No overnight events but this morning hypertensive and tachycardic. EKG personally reviewed, sinus tachycardia with normal axis. BIS values are low in the 30s. She is still on continuous paralytics.    12/20 -neuromuscular blockade discontinued yesterday.  At this point in time patient is extremely agitated on the ventilator despite Dilaudid infusion Versed infusion, oxycodone schedule, clonopoin scheduloed,  and phenobarb once daily at night.   She is on prednisone and Barcitinib. On vent 50%. AFebrikle  12/21  - 12/21 - cxr horrible. 40% fio2, pulse ox 90%,.  On dilaudid gtt, oxycdone po, precedex gtt, versed gt and klonopin,  On phenobarb QHS x 2 night.  On haldol schedulesd x 1 dayt.   -. RASS -4 but easily goes to +2 per RN. On TF.  K 2.9 while on 62mkcl daily. + 4L volume overloa  12/22 - 40% fio2. On dilaudid gtt, [precedex gtt, versed gtt. On oxy and klonopin. Still very agitated On TF. -2L since admit   12/23 -> stil with significant intermittent agitation. On 30% fio2, Now on diprivan gtt, precedex gtt, fent gtt, versed gtt, levophed gtt. On vent, 30% fio2  12/24   - ? Low grade fever. 30% fio2 on vent. On fent gtt, prededex gtt, ketamine gtt, versed gtt . Also on levophed gtt. Some breakthrough agitation per RN but much better overall. Off diprivan gtt   12/26 -  On fent gtt, precedex gtt, ketamnie gtt, versed gtt. On TF. On free water.  Low grade fever + . On vent  - 30   12/28 Tracheostomy-paralytics off   Consults:    Procedures:  Intubation 12/17 > 12/28 Left IJ triple-lumen catheter 12/17 Trach (DS) 12/28 >>    Significant Diagnostic Tests:  CT brain 12/15: Negative for acute CVA or acute process Head CT 12/26 >> nothing acute  Micro Data:  MRSA PCR 12/15 urine culture>> 80,000 colonies GNR ->Klebsiella (resistant to ampi but sensitive to unasyn) Blood culture 12/15>>> Respiratory panel by RT-PCR 12/15: Positive for Covid xxxxx Trach aspirate 12/25 - staph aureus nos Trach aspirate 12/29 >    Antimicrobials:  Remdesivir 12/15 Cefepime 12/15-->12/16 Vancomycin 12/15-->12/16 unasyn 12/16 (PCT 25, klbe uti) >>>12/20 xxx covid Rx  - barcitinib - endds 12/30 or 12/29 - prednisone - ended 12/24 xxx Vanc 12/26 >> 12/27 Cefazolin 12/27 >>    Interim History / Subjective:   Intermittently very agitated and active, then can be calm and apneic when attempting PSV Versed 12, fentanyl 359  FiO2  0.60, PEEP 8   Objective   Blood pressure 93/64, pulse 93, temperature 99 F (37.2 C), temperature source Axillary, resp. rate (!) 28, height 5' 3"  (1.6 m), weight 97 kg, SpO2 97 %.    Vent Mode: PRVC FiO2 (%):  [60 %] 60 % Set Rate:  [28 bmp-34 bmp] 28 bmp Vt Set:  [320 mL] 320 mL PEEP:  [8 cmH20] 8 cmH20 Pressure Support:  [10 cmH20-14 cmH20] 10 cmH20 Plateau Pressure:  [18 cmH20-20 cmH20] 18 cmH20   Intake/Output Summary (Last 24 hours) at 02/21/2020 0801 Last data filed at 02/21/2020 0700 Gross per 24 hour  Intake 2919.16 ml  Output 2735 ml  Net 184.16 ml   Filed Weights   02/19/20 0500 02/20/20 0500 02/21/20 0500  Weight: 99.9 kg 98.2 kg 97 kg   General Appearance: Critically ill-appearing woman, trach in place, HEENT: Trach, no bleeding, pupils equal, reactive light Lungs: Coarse bilaterally, scattered crackles, no wheeze Heart: Distant, tachycardic 130, no murmur, regular Abdomen: Nontender, nondistended, positive bowel sounds Extremities: 1+ lower extremity edema Skin: No rash Neurologic: Wakes easily to voice, nods to questions.  No agitation.  Does breathe over the set rate on MV   CXR 12/29- trach in place, improved B lung volumes, persistent basilar atx/infil   Resolved Hospital Problem list    Mild elevated LFTs - transamints 02/15/2020 -resolved  Assessment & Plan:     Acute hypoxic respiratory failure in setting of Covid pneumonia, and ARDS, plus minus CAP versus aspiration  Baseline asthma nos hx Tracheostomy 24/23 without complication Plan: -Intermittent agitation continues to be an issue, has tolerated some pressure support although periods of apnea on her current sedation -FiO2 0.60, PEEP 8, attempt wean as able -Follow repeat respiratory culture -Continue cefazolin course -VAP prevention orders -Bronchodilators as ordered  COVID-19   - s/p remedsivir  - sp steroids ended 12/24 plan -Baricitinib stop date 12/30   Klebsiella urinary  tract infection with sepsis syndrome with or without aspiration pneumonia - unasyn ended 02/11/20 Staph NOS VAP on 12/26 Plan -MSSA on respiratory culture, continue cefazolin, plan for 7 days total (day 5 antibiotics) -Repeat respiratory culture performed given thick tan secretions, pending   Acute metabolic encephalopathy Chronic xanax and on suboxone and has hx of opioid OD in 2018, in the setting of sepsis and Covid infection.   Agitated with dyssynchrony almost to the point of coding on 12/26 Plan -Intermittent agitation remains an issue.  When she is calm she is apneic on PSV.  Need to attempt to slowly wean sedating infusions. -Increase scheduled methadone 12/30, increase scheduled clonazepam 12/30.  Continue scheduled Dilaudid -Stop scheduled Haldol.  Start scheduled Seroquel.  Continue to follow QTC -Wean Versed and fentanyl infusions more aggressively  Total body Volume overload ECHO normal 02/12/20.  Total I/O- 8.8 L Plan -Continue scheduled Lasix 40 mg IV daily  Hypophosphatemia, improved -Replaced 12/28 -Continue to follow BMP  Anemia of critical illness Stable Plan -Follow CBC -Goal hemoglobin > 7.0  Hyperglycemia Plan -Continue resistant sliding scale insulin coverage -Continue Levemir as ordered   Best practice (evaluated daily)  Diet: Tube feeding   Pain/Anxiety/Delirium protocol (if indicated): Fentanyl and Versed  VAP protocol (if indicated): yes, intiated 12/17  DVT prophylaxis: Lovenox  GI prophylaxis: PPI  Glucose control: Sliding scale insulin plus Levemir  Mobility: Bedrest currently requiring restraints  Disposition: Intensive care  Family:  Dad Donnie Hollinghead [phone] -> Updated patient's father by phone on 12/30  Goals of Care:  Last date of multidisciplinary goals of care discussion: via phone so not able to do multidisciplinary meeting  Family and staff present: NP and mother dianne  Summary of discussion: Full code   Follow up goals of care discussion due: Goals of care discussion done 12/28 with consent for trach   Code Status: Full code    LABS    PULMONARY Recent Labs  Lab 02/15/20 0459 02/17/20 1114 02/17/20 1730 02/17/20 2128  PHART 7.444 7.350 7.416 7.344*  PCO2ART 42.1 55.6* 43.7 56.7*  PO2ART 58.3* 285* 86 107  HCO3 28.3* 30.3* 28.0 30.7*  TCO2  --  32 29 32  O2SAT 87.6 100.0 96.0 98.0    CBC Recent Labs  Lab 02/18/20 0332 02/19/20 0233 02/20/20 0332  HGB 11.1* 9.9* 9.6*  HCT 35.0* 29.4* 29.1*  WBC 14.0* 14.2* 11.7*  PLT 328 284 284    COAGULATION No results for input(s): INR in the last 168 hours.  CARDIAC  No results for input(s): TROPONINI in the last 168 hours. No results for input(s): PROBNP in the last 168 hours.   CHEMISTRY Recent Labs  Lab 02/16/20 0421 02/17/20 0348 02/17/20 1114 02/17/20 2012 02/17/20 2128 02/18/20 0332 02/19/20 0233 02/20/20 0332  NA 134* 134*   < > 131* 132* 133* 134* 136  K 4.6 4.4   < > 4.2 4.2 4.4 4.5 4.4  CL 99 99  --  95*  --  93* 98 95*  CO2 25 26  --  25  --  27 30 28   GLUCOSE 90 135*  --  263*  --  190* 169* 126*  BUN 18 15  --  12  --  12 12 19   CREATININE 0.58 0.60  --  0.67  --  0.65 0.55 0.57  CALCIUM 8.0* 7.9*  --  8.0*  --  8.2* 8.2* 8.4*  MG 2.5* 2.4  --  2.0  --   --  2.3 2.2  PHOS 4.3 3.7  --  3.4  --   --  2.4* 3.8   < > = values in this interval not displayed.   Estimated Creatinine Clearance: 105.7 mL/min (by C-G formula based on SCr of 0.57 mg/dL).   LIVER Recent Labs  Lab 02/15/20 0435 02/16/20 0421 02/17/20 0348 02/17/20 2012 02/18/20 0332  AST 31 37 29 60* 40  ALT 48* 54* 46* 79* 78*  ALKPHOS 50 60 54 85 93  BILITOT 0.5 0.5 0.6 0.8 0.8  PROT 5.6* 6.0* 5.6* 7.0 7.2  ALBUMIN 2.4* 2.6* 2.4* 2.8* 2.8*     INFECTIOUS Recent Labs  Lab 02/15/20 0325 02/16/20 1352 02/17/20 0348 02/17/20 2012 02/18/20 0332  LATICACIDVEN 0.7  --   --  0.8 0.8  PROCALCITON  --  <0.10 <0.10  --  0.27      ENDOCRINE CBG (last 3)  Recent Labs    02/20/20 1937 02/20/20 2345 02/21/20 0346  GLUCAP 107* 132* 56    ATTESTATION & SIGNATURE   CRITICAL CARE Performed by: Collene Gobble   Total critical care time: 32 minutes  Critical care time was  exclusive of separately billable procedures and treating other patients.  Critical care was necessary to treat or prevent imminent or life-threatening deterioration.  Critical care was time spent personally by me on the following activities: development of treatment plan with patient and/or surrogate as well as nursing, discussions with consultants, evaluation of patient's response to treatment, examination of patient, obtaining history from patient or surrogate, ordering and performing treatments and interventions, ordering and review of laboratory studies, ordering and review of radiographic studies, pulse oximetry and re-evaluation of patient's condition.  Baltazar Apo, MD, PhD 02/21/2020, 9:35 AM [facility] [phone] or if no answer [phone]

## 2020-02-21 NOTE — Evaluation (Signed)
Physical Therapy Evaluation Patient Details Name: Belinda [name] MRN: [id number] DOB: 1980/08/16 Today's Date: 02/21/2020   History of Present Illness  39 yo admitted 12/15 with AMS, fever, ARDS due to Covid 19 (+) and UTI. Intubated 12/17, trach 12/28. Pt with maintained agitation delirium since admission. PMhx: obesity  Clinical Impression  Pt with confusion and restlessness throughout session. Pt able to mouth words and able to repeat "December 30th" when asked. Pt able to roll and assist with mobility in bed but not oriented to place or situation and very limited by restlessness. Pt with decreased cognition, mobility, function, and cardiopulmonary status who will benefit from acute therapy to maximize mobility, safety and function to decrease burden of care.   HR 125-140 with limited bed level activity SpO2 95% on PRVC fio2 50%    Follow Up Recommendations LTACH;Supervision/Assistance - 24 hour;CIR (pending progression and medical stability)    Equipment Recommendations  Other (comment) (TBD)    Recommendations for Other Services       Precautions / Restrictions Precautions Precautions: Fall Precaution Comments: trach, vent, cortrak, bil wrist restraints, flexiseal      Mobility  Bed Mobility Overal bed mobility: Needs Assistance Bed Mobility: Rolling Rolling: Min assist;+2 for safety/equipment         General bed mobility comments: min assist +2 for safety and lines with multimodal cues as pt will continue roll to prone without max cueing and tactile cues. Unsafe to attempt further mobility at present due to cognition and Rt radial Aline leaking    Transfers                 General transfer comment: not tested  Ambulation/Gait                Stairs            Wheelchair Mobility    Modified Rankin (Stroke Patients Only)       Balance                                             Pertinent Vitals/Pain Pain  Assessment: No/denies pain (CPOT = 0)    Home Living Family/patient expects to be discharged to:: Private residence Living Arrangements: Children               Additional Comments: pt unable to provide home setup or PLOF and no family present    Prior Function                 Hand Dominance        Extremity/Trunk Assessment   Upper Extremity Assessment Upper Extremity Assessment: Generalized weakness;Difficult to assess due to impaired cognition (pt able to lift both arms against gravity)    Lower Extremity Assessment Lower Extremity Assessment: Difficult to assess due to impaired cognition;Generalized weakness       Communication   Communication: Tracheostomy  Cognition Arousal/Alertness: Awake/alert;Lethargic Behavior During Therapy: Restless Overall Cognitive Status: Impaired/Different from baseline Area of Impairment: Orientation;Memory;Following commands                 Orientation Level: Disoriented to;Time;Situation;Place   Memory: Decreased short-term memory Following Commands: Follows one step commands inconsistently       General Comments: pt initially with eyes open on arrival able to appropriately mouth her name and follow commands to lift arms and roll. When rolling for pericare  pt will attempt to completely prone rather than just roll to side. When asked location pt stated "[place]" and was educated for place, time and situation. When asked year she stated "I can't remember"      General Comments      Exercises General Exercises - Upper Extremity Shoulder Flexion: AROM;Both;Supine;5 reps   Assessment/Plan    PT Assessment Patient needs continued PT services  PT Problem List Decreased strength;Decreased mobility;Decreased safety awareness;Decreased activity tolerance;Decreased cognition;Decreased coordination;Decreased knowledge of precautions;Cardiopulmonary status limiting activity;Obesity;Decreased knowledge of use of DME        PT Treatment Interventions Gait training;Balance training;DME instruction;Therapeutic exercise;Neuromuscular re-education;Functional mobility training;Cognitive remediation;Therapeutic activities;Patient/family education    PT Goals (Current goals can be found in the Care Plan section)  Acute Rehab PT Goals PT Goal Formulation: Patient unable to participate in goal setting Time For Goal Achievement: 04/03/20 Potential to Achieve Goals: Fair    Frequency Min 3X/week   Barriers to discharge        Co-evaluation               AM-PAC PT "6 Clicks" Mobility  Outcome Measure Help needed turning from your back to your side while in a flat bed without using bedrails?: A Little Help needed moving from lying on your back to sitting on the side of a flat bed without using bedrails?: A Lot Help needed moving to and from a bed to a chair (including a wheelchair)?: Total Help needed standing up from a chair using your arms (e.g., wheelchair or bedside chair)?: Total Help needed to walk in hospital room?: Total Help needed climbing 3-5 steps with a railing? : Total 6 Click Score: 9    End of Session   Activity Tolerance: Patient tolerated treatment well Patient left: in bed;with call bell/phone within reach;with restraints reapplied;with nursing/sitter in room Nurse Communication: Mobility status PT Visit Diagnosis: Other abnormalities of gait and mobility (R26.89);Muscle weakness (generalized) (M62.81);Other symptoms and signs involving the nervous system (R29.898)    Time: 3428-7681 PT Time Calculation (min) (ACUTE ONLY): 24 min   Charges:   PT Evaluation $PT Eval High Complexity: 1 High          Geneveive Furness P, PT Acute Rehabilitation Services Pager: [phone] Office: [phone]   Divina Neale B Hasnain Manheim 02/21/2020, 1:35 PM

## 2020-02-21 NOTE — Progress Notes (Signed)
SLP Cancellation Note  Patient Details Name: Belinda [name] MRN: [id number] DOB: 08-Jan-1981   Cancelled treatment:       Reason Eval/Treat Not Completed: Patient not medically ready (Pt still on the vent and RN indicated that it is unlikely that she'll be weaning to ATC today.)  [name] I. Hardin Negus, [place], [place] Office number [phone] Pager [place] 02/21/2020, 10:36 AM

## 2020-02-22 DIAGNOSIS — J9601 Acute respiratory failure with hypoxia: Secondary | ICD-10-CM | POA: Diagnosis not present

## 2020-02-22 LAB — POCT I-STAT 7, (LYTES, BLD GAS, ICA,H+H)
Acid-Base Excess: 10 mmol/L — ABNORMAL HIGH (ref 0.0–2.0)
Bicarbonate: 34.6 mmol/L — ABNORMAL HIGH (ref 20.0–28.0)
Calcium, Ion: 1.2 mmol/L (ref 1.15–1.40)
HCT: 29 % — ABNORMAL LOW (ref 36.0–46.0)
Hemoglobin: 9.9 g/dL — ABNORMAL LOW (ref 12.0–15.0)
O2 Saturation: 97 %
Patient temperature: 99.5
Potassium: 4.6 mmol/L (ref 3.5–5.1)
Sodium: 139 mmol/L (ref 135–145)
TCO2: 36 mmol/L — ABNORMAL HIGH (ref 22–32)
pCO2 arterial: 49.2 mmHg — ABNORMAL HIGH (ref 32.0–48.0)
pH, Arterial: 7.457 — ABNORMAL HIGH (ref 7.350–7.450)
pO2, Arterial: 87 mmHg (ref 83.0–108.0)

## 2020-02-22 LAB — GLUCOSE, CAPILLARY
Glucose-Capillary: 139 mg/dL — ABNORMAL HIGH (ref 70–99)
Glucose-Capillary: 136 mg/dL — ABNORMAL HIGH (ref 70–99)
Glucose-Capillary: 139 mg/dL — ABNORMAL HIGH (ref 70–99)
Glucose-Capillary: 157 mg/dL — ABNORMAL HIGH (ref 70–99)
Glucose-Capillary: 145 mg/dL — ABNORMAL HIGH (ref 70–99)
Glucose-Capillary: 166 mg/dL — ABNORMAL HIGH (ref 70–99)

## 2020-02-22 LAB — CULTURE, RESPIRATORY W GRAM STAIN: Gram Stain: NONE SEEN

## 2020-02-22 MED ORDER — HYDROMORPHONE HCL 2 MG PO TABS
4.0000 mg | ORAL_TABLET | ORAL | Status: DC
  Administered 2020-02-22 – 2020-02-25 (×19): 4 mg
  Filled 2020-02-22 (×19): qty 2

## 2020-02-22 MED ORDER — IPRATROPIUM-ALBUTEROL 0.5-2.5 (3) MG/3ML IN SOLN: 3.0000 mL | Freq: Four times a day (QID) | RESPIRATORY_TRACT | Status: DC | PRN

## 2020-02-22 MED ORDER — RISPERIDONE 3 MG PO TABS
3.0000 mg | ORAL_TABLET | Freq: Every day | ORAL | Status: DC
  Administered 2020-02-22 – 2020-02-28 (×7): 3 mg
  Filled 2020-02-22 (×5): qty 6
  Filled 2020-02-22 (×2): qty 1
  Filled 2020-02-22: qty 6

## 2020-02-22 MED ORDER — WHITE PETROLATUM EX OINT
TOPICAL_OINTMENT | CUTANEOUS | Status: AC
  Administered 2020-02-22: 0.2
  Filled 2020-02-22: qty 28.35

## 2020-02-22 MED ORDER — SODIUM CHLORIDE 0.9 % IV SOLN
INTRAVENOUS | Status: DC | PRN
  Administered 2020-02-22: 1000 mL via INTRAVENOUS

## 2020-02-22 NOTE — TOC Initial Note (Signed)
Transition of Care [facility]) - Initial/Assessment Note    Patient Details  Name: Belinda [name] MRN: [id number] Date of Birth: Sep 23, 1980  [facility] CM/SW Contact:    Verdell Carmine, RN Phone Number: 02/22/2020, 1:40 PM  Clinical Narrative:                 Day 34 in ICU post COVID pneumonia trached.  Not a candidate for LTAC due to insurance. Continue to wean off sedative medications. Vent.  May need rehab bridge to home.  CM will cont to follow , as well as CSW for needs.   Expected Discharge Plan: [place] Barriers to Discharge: Continued Medical Work up   Patient Goals and CMS Choice        Expected Discharge Plan and Services Expected Discharge Plan: [place]       Living arrangements for the past 2 months: Apartment                                      Prior Living Arrangements/Services Living arrangements for the past 2 months: Apartment   Patient language and need for interpreter reviewed:: Yes        Need for Family Participation in Patient Care: Yes (Comment) Care giver support system in place?: Yes (comment)   Criminal Activity/Legal Involvement Pertinent to Current Situation/Hospitalization: No - Comment as needed  Activities of Daily Living Home Assistive Devices/Equipment: None ADL Screening (condition at time of admission) Patient's cognitive ability adequate to safely complete daily activities?: No Is the patient deaf or have difficulty hearing?: No Does the patient have difficulty seeing, even when wearing glasses/contacts?: No Does the patient have difficulty concentrating, remembering, or making decisions?: Yes Patient able to express need for assistance with ADLs?: No Does the patient have difficulty dressing or bathing?: No Independently performs ADLs?: Yes (appropriate for developmental age) Does the patient have difficulty walking or climbing stairs?: No Weakness of Legs:  None Weakness of Arms/Hands: None  Permission Sought/Granted                  Emotional Assessment       Orientation: : Fluctuating Orientation (Suspected and/or reported Sundowners) Alcohol / Substance Use: Alcohol Use (on subloxone) Psych Involvement: No (comment)  Admission diagnosis:  Metabolic encephalopathy [H47.65] Acute respiratory failure with hypoxia ([place]) [J96.01] Pneumonia due to COVID-19 virus [U07.1, J12.82] COVID-19 [U07.1] Acute respiratory distress syndrome (ARDS) due to 2019 novel coronavirus ([place]) [U07.1, J80] Patient Active Problem List   Diagnosis Date Noted  . Acute respiratory failure ([place])   . Sepsis ([place]) 02/07/2020  . Acute metabolic encephalopathy 46/50/3546  . Hypokalemia 02/07/2020  . Hypoalbuminemia 02/07/2020  . Elevated liver enzymes 02/07/2020  . COVID-19 02/07/2020  . Pneumonia due to COVID-19 virus 02/06/2020   PCP:  Kathyrn Drown, MD Pharmacy:   [facility], [place] [address] [place] [address] Phone: [phone] Fax: [phone]     Social Determinants of Health (SDOH) Interventions    Readmission Risk Interventions No flowsheet data found.

## 2020-02-22 NOTE — Progress Notes (Signed)
SLP Cancellation Note  Patient Details Name: Belinda [name] MRN: [id number] DOB: 03-08-1980   Cancelled treatment:    Pt remains on vent; trach 12/28.  SLP will follow for readiness.  Karsen Fellows L. Tivis Ringer, [place] Office number [phone] Pager [phone]        Belinda [name] 02/22/2020, 1:48 PM

## 2020-02-22 NOTE — Progress Notes (Signed)
NAME:  Belinda [name], MRN:  [id number], DOB:  Apr 15, 1980, LOS: 86 ADMISSION DATE:  02/06/2020, CONSULTATION DATE:  12/16 REFERRING MD:  Josephine Cables ([facility]), CHIEF COMPLAINT:  Acute metabolic encephalopathy and respiratory failure in setting of COVID   BRIEF  39 year old white female, only medical history is obesity.  Presented to [facility] ER 12/15 w/ ARDS and acute metabolic encephalopathy 2/2 COVID  Past Medical History:  Obesity, anxiety, childhood asthma   (new info 12/26  - patient on suboxone and has hx of opioid OD in 2018) and hx of regular neb for [facility] Events:  12/15 presented to the emergency room, hypoxic, encephalopathic, Covid positive.CT brain negative for acute injury with only some mild para sinus mucosal thickening with some periodontal disease.  Started on supplemental oxygen, IV Solu-Medrol, IV remdesivir, and baricitinib.  Also started empirically on cefepime  and vancomycin to cover for potential bacterial pneumonia 12/16 multiple reports of worsening confusion, intermittent combativeness, attempts to bite staff.  Not able to take oral medications.  Started on Precedex.  Transferred to [facility]. Changed abx to unasyn, added low dose ativan as pt chronically on xanax.  12/17: Patient more hypoxic, requiring more sedation. Decision made to proceed with intubation.  Intubated, left IJ catheter placed.  PF ratio only 88.  Core track ordered.  Neuromuscular blockade initiated.  Prone protocol initiated.  12/19 - No overnight events but this morning hypertensive and tachycardic. EKG personally reviewed, sinus tachycardia with normal axis. BIS values are low in the 30s. She is still on continuous paralytics.    12/20 -neuromuscular blockade discontinued yesterday.  At this point in time patient is extremely agitated on the ventilator despite Dilaudid infusion Versed infusion, oxycodone schedule, clonopoin scheduloed,  and phenobarb once daily at night.   She is on prednisone and Barcitinib. On vent 50%. AFebrikle  12/21  - 12/21 - cxr horrible. 40% fio2, pulse ox 90%,.  On dilaudid gtt, oxycdone po, precedex gtt, versed gt and klonopin,  On phenobarb QHS x 2 night.  On haldol schedulesd x 1 dayt.   -. RASS -4 but easily goes to +2 per RN. On TF.  K 2.9 while on 61mkcl daily. + 4L volume overloa  12/22 - 40% fio2. On dilaudid gtt, [precedex gtt, versed gtt. On oxy and klonopin. Still very agitated On TF. -2L since admit   12/23 -> stil with significant intermittent agitation. On 30% fio2, Now on diprivan gtt, precedex gtt, fent gtt, versed gtt, levophed gtt. On vent, 30% fio2  12/24   - ? Low grade fever. 30% fio2 on vent. On fent gtt, prededex gtt, ketamine gtt, versed gtt . Also on levophed gtt. Some breakthrough agitation per RN but much better overall. Off diprivan gtt   12/26 -  On fent gtt, precedex gtt, ketamnie gtt, versed gtt. On TF. On free water.  Low grade fever + . On vent  - 30   12/28 Tracheostomy-paralytics off   Consults:    Procedures:  Intubation 12/17 > 12/28 Left IJ triple-lumen catheter 12/17 Trach (DS) 12/28 >>    Significant Diagnostic Tests:  CT brain 12/15: Negative for acute CVA or acute process Head CT 12/26 >> nothing acute  Micro Data:  MRSA PCR 12/15 urine culture>> 80,000 colonies GNR ->Klebsiella (resistant to ampi but sensitive to unasyn) Blood culture 12/15>>> Respiratory panel by RT-PCR 12/15: Positive for Covid xxxxx Trach aspirate 12/25 - staph aureus nos Trach aspirate 12/29 >  Few staph areus  Antimicrobials:  Remdesivir 12/15 Cefepime 12/15-->12/16 Vancomycin 12/15-->12/16 unasyn 12/16 (PCT 25, klbe uti) >>>12/20  covid Rx  - barcitinib - endds 12/30 or 12/29 - prednisone - ended 12/24  Vanc 12/26 >> 12/27 Cefazolin 12/27 >> 12/30   Interim History / Subjective:   12/31: remains intermittently agitated on vent via trach but is on cpap this am 15/8 40%. She appears  calm at this time but cont to complain of pain despite iv meds and 360mg of fentanyl AND methadone.    Objective   Blood pressure 111/67, pulse (!) 124, temperature 99.4 F (37.4 C), temperature source Oral, resp. rate 17, height 5' 3"  (1.6 m), weight 96.8 kg, SpO2 93 %.    Vent Mode: PRVC FiO2 (%):  [40 %-50 %] 40 % Set Rate:  [28 bmp] 28 bmp Vt Set:  [320 mL] 320 mL PEEP:  [8 cmH20] 8 cmH20   Intake/Output Summary (Last 24 hours) at 02/22/2020 0753 Last data filed at 02/22/2020 0700 Gross per 24 hour  Intake 2781.15 ml  Output 3330 ml  Net -548.85 ml   Filed Weights   02/20/20 0500 02/21/20 0500 02/22/20 0249  Weight: 98.2 kg 97 kg 96.8 kg   General Appearance: Critically ill-appearing woman, trach in place, alert and following commands HEENT: Trach, no bleeding, pupils equal, reactive light Lungs: Coarse bilaterally, scattered crackles, no wheeze Heart: Distant, tachycardic 100 no murmur, regular Abdomen: obese, Nontender, nondistended, positive bowel sounds Extremities: 2+ edema disffuse Skin: No rash Neurologic: Wakes easily to voice, nods to questions.     Resolved Hospital Problem list    Mild elevated LFTs - transaminitis 02/15/2020 -resolved  Assessment & Plan:     Acute hypoxic respiratory failure in setting of Covid pneumonia, and ARDS, plus minus CAP versus aspiration  Baseline asthma nos hx Tracheostomy 161/95without complication Plan: -Intermittent agitation continues to be an issue, but improved -on cpap. Will attempt atc for short period today.  -Follow repeat respiratory culture -Continue cefazolin course total of 7 days -VAP prevention orders -Bronchodilators as ordered  COVID-19   - s/p remedsivir  - sp steroids ended 12/24 plan -Baricitinib stop date 12/30   Klebsiella urinary tract infection with sepsis syndrome with or without aspiration pneumonia - unasyn ended 02/11/20 Staph NOS VAP on 12/26 Plan -MSSA on respiratory culture,  continue cefazolin, plan for 7 days total (2 days left) -Repeat respiratory culture performed given thick tan secretions, without considerable growth   Acute metabolic encephalopathy Chronic xanax and on suboxone and has hx of opioid OD in 2018, in the setting of sepsis and Covid infection.   Agitated with dyssynchrony almost to the point of coding on 12/26 Plan -changing haldol to risperdal -check qt (especiallly with methadone/haldol use) -cont methadone, change dilaudid to po -cont klonopin -Wean Versed and fentanyl infusions more aggressively  Total body Volume overload ECHO normal 02/12/20.  Total I/O- 10L Plan -Continue scheduled Lasix 40 mg IV daily as long as renal function allows  -currently with great diuresis  Hypophosphatemia, improved -Replaced 12/28 -Continue to follow BMP  Anemia of critical illness Stable Plan -Follow CBC -Goal hemoglobin > 7.0  Prediabetes with Hyperglycemia Plan -Continue resistant sliding scale insulin coverage -Continue Levemir as ordered -a1c 5.8   Best practice (evaluated daily)  Diet: Tube feeding   Pain/Anxiety/Delirium protocol (if indicated): Fentanyl and Versed  VAP protocol (if indicated): per protocol  DVT prophylaxis: Lovenox  GI prophylaxis: PPI  Glucose control: Sliding scale insulin plus Levemir  Mobility: Bedrest  currently requiring restraints  Disposition: Intensive care  Family:  Dad Donnie Hollinghead [phone] -> Updated patient's father by phone on 12/31 Sons are 16 (bret) and 21 (blake)  Goals of Care:  Last date of multidisciplinary goals of care discussion: via phone so not able to do multidisciplinary meeting  Family and staff present: NP and mother dianne  Summary of discussion: Full code  Follow up goals of care discussion due: Goals of care discussion done 12/28 with consent for trach   Code Status: Full code    [facility]  Lab 02/17/20 1114 02/17/20 1730  02/17/20 2128 02/21/20 0813  PHART 7.350 7.416 7.344* 7.429  PCO2ART 55.6* 43.7 56.7* 51.5*  PO2ART 285* 86 107 105  HCO3 30.3* 28.0 30.7* 34.1*  TCO2 32 29 32 36*  O2SAT 100.0 96.0 98.0 98.0    CBC Recent Labs  Lab 02/19/20 0233 02/20/20 0332 02/21/20 0813 02/21/20 0820  HGB 9.9* 9.6* 8.8* 9.4*  HCT 29.4* 29.1* 26.0* 28.6*  WBC 14.2* 11.7*  --  10.5  PLT 284 284  --  282    COAGULATION No results for input(s): INR in the last 168 hours.  CARDIAC  No results for input(s): TROPONINI in the last 168 hours. No results for input(s): PROBNP in the last 168 hours.   CHEMISTRY Recent Labs  Lab 02/17/20 0348 02/17/20 1114 02/17/20 2012 02/17/20 2128 02/18/20 0332 02/19/20 0233 02/20/20 0332 02/21/20 0813 02/21/20 0820  NA 134*   < > 131*   < > 133* 134* 136 136 137  K 4.4   < > 4.2   < > 4.4 4.5 4.4 4.1 4.1  CL 99  --  95*  --  93* 98 95*  --  96*  CO2 26  --  25  --  27 30 28   --  29  GLUCOSE 135*  --  263*  --  190* 169* 126*  --  135*  BUN 15  --  12  --  12 12 19   --  16  CREATININE 0.60  --  0.67  --  0.65 0.55 0.57  --  0.57  CALCIUM 7.9*  --  8.0*  --  8.2* 8.2* 8.4*  --  8.7*  MG 2.4  --  2.0  --   --  2.3 2.2  --  2.3  PHOS 3.7  --  3.4  --   --  2.4* 3.8  --  4.5   < > = values in this interval not displayed.   Estimated Creatinine Clearance: 105.7 mL/min (by C-G formula based on SCr of 0.57 mg/dL).   LIVER Recent Labs  Lab 02/16/20 0421 02/17/20 0348 02/17/20 2012 02/18/20 0332  AST 37 29 60* 40  ALT 54* 46* 79* 78*  ALKPHOS 60 54 85 93  BILITOT 0.5 0.6 0.8 0.8  PROT 6.0* 5.6* 7.0 7.2  ALBUMIN 2.6* 2.4* 2.8* 2.8*     INFECTIOUS Recent Labs  Lab 02/16/20 1352 02/17/20 0348 02/17/20 2012 02/18/20 0332  LATICACIDVEN  --   --  0.8 0.8  PROCALCITON <0.10 <0.10  --  0.27     ENDOCRINE CBG (last 3)  Recent Labs    02/21/20 1931 02/21/20 2331 02/22/20 0344  GLUCAP 112* 135* 139*      Critical care time: The patient is  critically ill with multiple organ systems failure and requires high complexity decision making for assessment and support, frequent evaluation and titration  of therapies, application of advanced monitoring technologies and extensive interpretation of multiple databases.  Critical care time 39 mins. This represents my time independent of the NPs time taking care of the pt. This is excluding procedures.    [facility] 02/22/2020, 7:54 AM

## 2020-02-22 NOTE — Progress Notes (Signed)
45cc of versed wasted in stericycle by this RN and witnessed by Lorri Frederick RN.

## 2020-02-23 DIAGNOSIS — J9601 Acute respiratory failure with hypoxia: Secondary | ICD-10-CM | POA: Diagnosis not present

## 2020-02-23 LAB — CBC WITH DIFFERENTIAL/PLATELET
Abs Immature Granulocytes: 0.13 10*3/uL — ABNORMAL HIGH (ref 0.00–0.07)
Basophils Absolute: 0 10*3/uL (ref 0.0–0.1)
Basophils Relative: 0 %
Eosinophils Absolute: 0.1 10*3/uL (ref 0.0–0.5)
Eosinophils Relative: 1 %
HCT: 31 % — ABNORMAL LOW (ref 36.0–46.0)
Hemoglobin: 10.2 g/dL — ABNORMAL LOW (ref 12.0–15.0)
Immature Granulocytes: 1 %
Lymphocytes Relative: 19 %
Lymphs Abs: 1.9 10*3/uL (ref 0.7–4.0)
MCH: 31.6 pg (ref 26.0–34.0)
MCHC: 32.9 g/dL (ref 30.0–36.0)
MCV: 96 fL (ref 80.0–100.0)
Monocytes Absolute: 0.8 10*3/uL (ref 0.1–1.0)
Monocytes Relative: 8 %
Neutro Abs: 6.8 10*3/uL (ref 1.7–7.7)
Neutrophils Relative %: 71 %
Platelets: 295 10*3/uL (ref 150–400)
RBC: 3.23 MIL/uL — ABNORMAL LOW (ref 3.87–5.11)
RDW: 13.8 % (ref 11.5–15.5)
WBC: 9.8 10*3/uL (ref 4.0–10.5)
nRBC: 0.4 % — ABNORMAL HIGH (ref 0.0–0.2)

## 2020-02-23 LAB — BASIC METABOLIC PANEL
Anion gap: 13 (ref 5–15)
BUN: 23 mg/dL — ABNORMAL HIGH (ref 6–20)
CO2: 32 mmol/L (ref 22–32)
Calcium: 9.4 mg/dL (ref 8.9–10.3)
Chloride: 98 mmol/L (ref 98–111)
Creatinine, Ser: 0.62 mg/dL (ref 0.44–1.00)
GFR, Estimated: >60 mL/min (ref >60)
Glucose, Bld: 150 mg/dL — ABNORMAL HIGH (ref 70–99)
Potassium: 4.3 mmol/L (ref 3.5–5.1)
Sodium: 143 mmol/L (ref 135–145)

## 2020-02-23 LAB — GLUCOSE, CAPILLARY
Glucose-Capillary: 114 mg/dL — ABNORMAL HIGH (ref 70–99)
Glucose-Capillary: 123 mg/dL — ABNORMAL HIGH (ref 70–99)
Glucose-Capillary: 126 mg/dL — ABNORMAL HIGH (ref 70–99)
Glucose-Capillary: 134 mg/dL — ABNORMAL HIGH (ref 70–99)
Glucose-Capillary: 136 mg/dL — ABNORMAL HIGH (ref 70–99)
Glucose-Capillary: 136 mg/dL — ABNORMAL HIGH (ref 70–99)

## 2020-02-23 LAB — MAGNESIUM: Magnesium: 2.6 mg/dL — ABNORMAL HIGH (ref 1.7–2.4)

## 2020-02-23 NOTE — Progress Notes (Signed)
eLink Physician-Brief Progress Note Patient Name: Belinda [name] DOB: 21-Jul-1980 MRN: [id number]   Date of Service  02/23/2020  HPI/Events of Note    eICU Interventions   a.m.  Labs ordered.        Kerry Kass Elizette Shek 02/23/2020, 5:32 AM

## 2020-02-23 NOTE — Progress Notes (Signed)
NAME:  Belinda [name], MRN:  [id number], DOB:  Oct 26, 1980, LOS: 21 ADMISSION DATE:  02/06/2020, CONSULTATION DATE:  12/16 REFERRING MD:  Josephine Cables ([facility]), CHIEF COMPLAINT:  Acute metabolic encephalopathy and respiratory failure in setting of COVID   BRIEF  40 year old white female, only medical history is obesity.  Presented to [facility] ER 12/15 w/ ARDS and acute metabolic encephalopathy 2/2 COVID  Past Medical History:  Obesity, anxiety, childhood asthma   (new info 12/26  - patient on suboxone and has hx of opioid OD in 2018) and hx of regular neb for [facility] Events:  12/15 presented to the emergency room, hypoxic, encephalopathic, Covid positive.CT brain negative for acute injury with only some mild para sinus mucosal thickening with some periodontal disease.  Started on supplemental oxygen, IV Solu-Medrol, IV remdesivir, and baricitinib.  Also started empirically on cefepime  and vancomycin to cover for potential bacterial pneumonia 12/16 multiple reports of worsening confusion, intermittent combativeness, attempts to bite staff.  Not able to take oral medications.  Started on Precedex.  Transferred to [facility]. Changed abx to unasyn, added low dose ativan as pt chronically on xanax.  12/17: Patient more hypoxic, requiring more sedation. Decision made to proceed with intubation.  Intubated, left IJ catheter placed.  PF ratio only 88.  Core track ordered.  Neuromuscular blockade initiated.  Prone protocol initiated.  12/19 - No overnight events but this morning hypertensive and tachycardic. EKG personally reviewed, sinus tachycardia with normal axis. BIS values are low in the 30s. She is still on continuous paralytics.    12/20 -neuromuscular blockade discontinued yesterday.  At this point in time patient is extremely agitated on the ventilator despite Dilaudid infusion Versed infusion, oxycodone schedule, clonopoin scheduloed,  and phenobarb once daily at night.   She is on prednisone and Barcitinib. On vent 50%. AFebrikle  12/21  - 12/21 - cxr horrible. 40% fio2, pulse ox 90%,.  On dilaudid gtt, oxycdone po, precedex gtt, versed gt and klonopin,  On phenobarb QHS x 2 night.  On haldol schedulesd x 1 dayt.   -. RASS -4 but easily goes to +2 per RN. On TF.  K 2.9 while on 48mkcl daily. + 4L volume overloa  12/22 - 40% fio2. On dilaudid gtt, [precedex gtt, versed gtt. On oxy and klonopin. Still very agitated On TF. -2L since admit   12/23 -> stil with significant intermittent agitation. On 30% fio2, Now on diprivan gtt, precedex gtt, fent gtt, versed gtt, levophed gtt. On vent, 30% fio2  12/24   - ? Low grade fever. 30% fio2 on vent. On fent gtt, prededex gtt, ketamine gtt, versed gtt . Also on levophed gtt. Some breakthrough agitation per RN but much better overall. Off diprivan gtt   12/26 -  On fent gtt, precedex gtt, ketamnie gtt, versed gtt. On TF. On free water.  Low grade fever + . On vent  - 30   12/28 Tracheostomy-paralytics off   Consults:    Procedures:  Intubation 12/17 > 12/28 Left IJ triple-lumen catheter 12/17 Trach (DS) 12/28 >>    Significant Diagnostic Tests:  CT brain 12/15: Negative for acute CVA or acute process Head CT 12/26 >> nothing acute  Micro Data:  MRSA PCR 12/15 urine culture>> 80,000 colonies GNR ->Klebsiella (resistant to ampi but sensitive to unasyn) Blood culture 12/15>>> Respiratory panel by RT-PCR 12/15: Positive for Covid xxxxx Trach aspirate 12/25 - staph aureus nos Trach aspirate 12/29 >  Few staph areus  Antimicrobials:  Remdesivir 12/15 Cefepime 12/15-->12/16 Vancomycin 12/15-->12/16 unasyn 12/16 (PCT 25, klbe uti) >>>12/20  covid Rx  - barcitinib - endds 12/30 or 12/29 - prednisone - ended 12/24  Vanc 12/26 >> 12/27 Cefazolin 12/27 >> 12/30   Interim History / Subjective:  1/1: tolerated multiple hours of cpap via trach 15/8 but will cont to wean down to atc as we can. Cont  weaning iv sedation as able. Pt remains calm and no reported events overnight. Lab holiday is ok.  12/31: remains intermittently agitated on vent via trach but is on cpap this am 15/8 40%. She appears calm at this time but cont to complain of pain despite iv meds and 327mg of fentanyl AND methadone.    Objective   Blood pressure 120/86, pulse (!) 117, temperature 99.7 F (37.6 C), temperature source Oral, resp. rate 11, height 5' 3"  (1.6 m), weight 94.1 kg, SpO2 95 %.    Vent Mode: PRVC FiO2 (%):  [40 %] 40 % Set Rate:  [28 bmp] 28 bmp Vt Set:  [320 mL] 320 mL PEEP:  [5 cmH20] 5 cmH20   Intake/Output Summary (Last 24 hours) at 02/23/2020 07026Last data filed at 02/23/2020 0800 Gross per 24 hour  Intake 2250.28 ml  Output 2905 ml  Net -654.72 ml   Filed Weights   02/21/20 0500 02/22/20 0249 02/23/20 0500  Weight: 97 kg 96.8 kg 94.1 kg   General Appearance: Critically ill-appearing woman, trach in place, alert and following commands HEENT: Trach, no bleeding, pupils equal, reactive light Lungs: Coarse bilaterally, scattered crackles, no wheeze Heart: Distant, tachycardic 100 no murmur, regular Abdomen: obese, Nontender, nondistended, positive bowel sounds Extremities: 2+ edema disffuse Skin: No rash Neurologic: Wakes easily to voice, nods to questions.     Resolved Hospital Problem list    Mild elevated LFTs - transaminitis 02/15/2020 -resolved  Assessment & Plan:     Acute hypoxic respiratory failure in setting of Covid pneumonia, and ARDS, plus minus CAP versus aspiration  Baseline asthma nos hx Tracheostomy 137/85without complication Plan: -Intermittent agitation continues to be an issue, but improved -on cpap. Will cont to wean settings or transition to ATC based on pt ability -Follow repeat respiratory culture -Continue cefazolin course total of 7 days -VAP prevention orders -Bronchodilators as ordered  COVID-19   - s/p remedsivir  - sp steroids ended  12/24 plan -Baricitinib stop date 12/30   Klebsiella urinary tract infection with sepsis syndrome with or without aspiration pneumonia - unasyn ended 02/11/20 Staph NOS VAP on 12/26 Plan -MSSA on respiratory culture, continue cefazolin, plan for 7 days total (2 days left) -Repeat respiratory culture performed given thick tan secretions, without considerable growth   Acute metabolic encephalopathy Chronic xanax and on suboxone and has hx of opioid OD in 2018, in the setting of sepsis and Covid infection.   Agitated with dyssynchrony almost to the point of coding on 12/26 Plan -cont risperdal -follow qt (especiallly with methadone/haldol use) -cont methadone, change dilaudid to po -cont klonopin -Wean Versed and fentanyl infusions more aggressively  Total body Volume overload ECHO normal 02/12/20.  Total I/O- 10L Plan -Continue scheduled Lasix 40 mg IV daily as long as renal function allows  -currently with great diuresis  Hypophosphatemia, improved -Replaced 12/28 -  Anemia of critical illness Stable Plan -Follow CBC intermittently -Goal hemoglobin > 7.0  Prediabetes with Hyperglycemia Plan -Continue resistant sliding scale insulin coverage -Continue Levemir as ordered -a1c 5.8   Best practice (evaluated daily)  Diet: Tube feeding   Pain/Anxiety/Delirium protocol (if indicated): Fentanyl and Versed (weaning)  VAP protocol (if indicated): per protocol  DVT prophylaxis: Lovenox  GI prophylaxis: PPI  Glucose control: Sliding scale insulin plus Levemir  Mobility: Bedrest currently requiring restraints  Disposition: Intensive care  Family:  Dad Donnie Hollinghead [phone] -> Updated patient's father by phone on 12/31 Sons are 16 (bret) and 21 (blake)  Goals of Care:  Last date of multidisciplinary goals of care discussion: via phone so not able to do multidisciplinary meeting  Family and staff present: NP and mother dianne  Summary of discussion: Full  code  Follow up goals of care discussion due: Goals of care discussion done 12/28 with consent for trach   Code Status: Full code    LABS    PULMONARY Recent Labs  Lab 02/17/20 1114 02/17/20 1730 02/17/20 2128 02/21/20 0813 02/22/20 0803  PHART 7.350 7.416 7.344* 7.429 7.457*  PCO2ART 55.6* 43.7 56.7* 51.5* 49.2*  PO2ART 285* 86 107 105 87  HCO3 30.3* 28.0 30.7* 34.1* 34.6*  TCO2 32 29 32 36* 36*  O2SAT 100.0 96.0 98.0 98.0 97.0    CBC Recent Labs  Lab 02/20/20 0332 02/21/20 0813 02/21/20 0820 02/22/20 0803 02/23/20 0603  HGB 9.6*   < > 9.4* 9.9* 10.2*  HCT 29.1*   < > 28.6* 29.0* 31.0*  WBC 11.7*  --  10.5  --  9.8  PLT 284  --  282  --  295   < > = values in this interval not displayed.    COAGULATION No results for input(s): INR in the last 168 hours.  CARDIAC  No results for input(s): TROPONINI in the last 168 hours. No results for input(s): PROBNP in the last 168 hours.   CHEMISTRY Recent Labs  Lab 02/17/20 0348 02/17/20 1114 02/17/20 2012 02/17/20 2128 02/18/20 0865 02/19/20 0233 02/20/20 0332 02/21/20 0813 02/21/20 0820 02/22/20 0803 02/23/20 0603  NA 134*   < > 131*   < > 133* 134* 136 136 137 139 143  K 4.4   < > 4.2   < > 4.4 4.5 4.4 4.1 4.1 4.6 4.3  CL 99  --  95*  --  93* 98 95*  --  96*  --  98  CO2 26  --  25  --  27 30 28   --  29  --  32  GLUCOSE 135*  --  263*  --  190* 169* 126*  --  135*  --  150*  BUN 15  --  12  --  12 12 19   --  16  --  23*  CREATININE 0.60  --  0.67  --  0.65 0.55 0.57  --  0.57  --  0.62  CALCIUM 7.9*  --  8.0*  --  8.2* 8.2* 8.4*  --  8.7*  --  9.4  MG 2.4  --  2.0  --   --  2.3 2.2  --  2.3  --  2.6*  PHOS 3.7  --  3.4  --   --  2.4* 3.8  --  4.5  --   --    < > = values in this interval not displayed.   Estimated Creatinine Clearance: 104 mL/min (by C-G formula based on SCr of 0.62 mg/dL).   LIVER Recent Labs  Lab 02/17/20 0348 02/17/20 2012 02/18/20 0332  AST 29 60* 40  ALT 46* 79* 78*   ALKPHOS 54 85  93  BILITOT 0.6 0.8 0.8  PROT 5.6* 7.0 7.2  ALBUMIN 2.4* 2.8* 2.8*     INFECTIOUS Recent Labs  Lab 02/16/20 1352 02/17/20 0348 02/17/20 2012 02/18/20 0332  LATICACIDVEN  --   --  0.8 0.8  PROCALCITON <0.10 <0.10  --  0.27     ENDOCRINE CBG (last 3)  Recent Labs    02/22/20 2323 02/23/20 0331 02/23/20 0838  GLUCAP 166* 123* 126*      Critical care time: The patient is critically ill with multiple organ systems failure and requires high complexity decision making for assessment and support, frequent evaluation and titration of therapies, application of advanced monitoring technologies and extensive interpretation of multiple databases.  Critical care time 37 mins. This represents my time independent of the NPs time taking care of the pt. This is excluding procedures.    [facility] 02/23/2020, 9:07 AM

## 2020-02-24 DIAGNOSIS — J9601 Acute respiratory failure with hypoxia: Secondary | ICD-10-CM | POA: Diagnosis not present

## 2020-02-24 LAB — GLUCOSE, CAPILLARY
Glucose-Capillary: 119 mg/dL — ABNORMAL HIGH (ref 70–99)
Glucose-Capillary: 134 mg/dL — ABNORMAL HIGH (ref 70–99)
Glucose-Capillary: 134 mg/dL — ABNORMAL HIGH (ref 70–99)
Glucose-Capillary: 145 mg/dL — ABNORMAL HIGH (ref 70–99)
Glucose-Capillary: 174 mg/dL — ABNORMAL HIGH (ref 70–99)
Glucose-Capillary: 182 mg/dL — ABNORMAL HIGH (ref 70–99)

## 2020-02-24 NOTE — Progress Notes (Signed)
NAME:  Belinda [name], MRN:  [id number], DOB:  09/09/1980, LOS: 21 ADMISSION DATE:  02/06/2020, CONSULTATION DATE:  12/16 REFERRING MD:  Josephine Cables ([facility]), CHIEF COMPLAINT:  Acute metabolic encephalopathy and respiratory failure in setting of COVID   BRIEF  40 year old white female, only medical history is obesity.  Presented to [facility] ER 12/15 w/ ARDS and acute metabolic encephalopathy 2/2 COVID  Past Medical History:  Obesity, anxiety, childhood asthma   (new info 12/26  - patient on suboxone and has hx of opioid OD in 2018) and hx of regular neb for [facility] Events:  12/15 presented to the emergency room, hypoxic, encephalopathic, Covid positive.CT brain negative for acute injury with only some mild para sinus mucosal thickening with some periodontal disease.  Started on supplemental oxygen, IV Solu-Medrol, IV remdesivir, and baricitinib.  Also started empirically on cefepime  and vancomycin to cover for potential bacterial pneumonia 12/16 multiple reports of worsening confusion, intermittent combativeness, attempts to bite staff.  Not able to take oral medications.  Started on Precedex.  Transferred to [facility]. Changed abx to unasyn, added low dose ativan as pt chronically on xanax.  12/17: Patient more hypoxic, requiring more sedation. Decision made to proceed with intubation.  Intubated, left IJ catheter placed.  PF ratio only 88.  Core track ordered.  Neuromuscular blockade initiated.  Prone protocol initiated.  12/19 - No overnight events but this morning hypertensive and tachycardic. EKG personally reviewed, sinus tachycardia with normal axis. BIS values are low in the 30s. She is still on continuous paralytics.    12/20 -neuromuscular blockade discontinued yesterday.  At this point in time patient is extremely agitated on the ventilator despite Dilaudid infusion Versed infusion, oxycodone schedule, clonopoin scheduloed,  and phenobarb once daily at night.   She is on prednisone and Barcitinib. On vent 50%. AFebrikle  12/21  - 12/21 - cxr horrible. 40% fio2, pulse ox 90%,.  On dilaudid gtt, oxycdone po, precedex gtt, versed gt and klonopin,  On phenobarb QHS x 2 night.  On haldol schedulesd x 1 dayt.   -. RASS -4 but easily goes to +2 per RN. On TF.  K 2.9 while on 38mkcl daily. + 4L volume overloa  12/22 - 40% fio2. On dilaudid gtt, [precedex gtt, versed gtt. On oxy and klonopin. Still very agitated On TF. -2L since admit   12/23 -> stil with significant intermittent agitation. On 30% fio2, Now on diprivan gtt, precedex gtt, fent gtt, versed gtt, levophed gtt. On vent, 30% fio2  12/24   - ? Low grade fever. 30% fio2 on vent. On fent gtt, prededex gtt, ketamine gtt, versed gtt . Also on levophed gtt. Some breakthrough agitation per RN but much better overall. Off diprivan gtt   12/26 -  On fent gtt, precedex gtt, ketamnie gtt, versed gtt. On TF. On free water.  Low grade fever + . On vent  - 30   12/28 Tracheostomy-paralytics off   Consults:    Procedures:  Intubation 12/17 > 12/28 Left IJ triple-lumen catheter 12/17 Trach (DS) 12/28 >>    Significant Diagnostic Tests:  CT brain 12/15: Negative for acute CVA or acute process Head CT 12/26 >> nothing acute  Micro Data:  MRSA PCR 12/15 urine culture>> 80,000 colonies GNR ->Klebsiella (resistant to ampi but sensitive to unasyn) Blood culture 12/15>>> Respiratory panel by RT-PCR 12/15: Positive for Covid xxxxx Trach aspirate 12/25 - staph aureus nos Trach aspirate 12/29 >  Few staph areus  Antimicrobials:  Remdesivir 12/15 Cefepime 12/15-->12/16 Vancomycin 12/15-->12/16 unasyn 12/16 (PCT 25, klbe uti) >>>12/20  covid Rx  - barcitinib - endds 12/30 or 12/29 - prednisone - ended 12/24  Vanc 12/26 >> 12/27 Cefazolin 12/27 >> 12/30   Interim History / Subjective:  1/2: tolerated cpap yesterday 8/5 all day. On atc this am and doing well. Getting fentanyl off today.   1/1: tolerated multiple hours of cpap via trach 15/8 but will cont to wean down to atc as we can. Cont weaning iv sedation as able. Pt remains calm and no reported events overnight. Lab holiday is ok.  12/31: remains intermittently agitated on vent via trach but is on cpap this am 15/8 40%. She appears calm at this time but cont to complain of pain despite iv meds and 386mg of fentanyl AND methadone.    Objective   Blood pressure 97/72, pulse (!) 110, temperature 100 F (37.8 C), temperature source Oral, resp. rate 20, height 5' 3"  (1.6 m), weight 96 kg, SpO2 (!) 89 %.    Vent Mode: PRVC FiO2 (%):  [40 %] 40 % Set Rate:  [28 bmp] 28 bmp Vt Set:  [320 mL] 320 mL PEEP:  [5 cmH20] 5 cmH20 Pressure Support:  [8 cmH20] 8 cmH20   Intake/Output Summary (Last 24 hours) at 02/24/2020 0816 Last data filed at 02/24/2020 0800 Gross per 24 hour  Intake 2506.65 ml  Output 2450 ml  Net 56.65 ml   Filed Weights   02/22/20 0249 02/23/20 0500 02/24/20 0400  Weight: 96.8 kg 94.1 kg 96 kg   General Appearance: Critically ill-appearing woman, trach in place, alert and following commands HEENT: Trach, no bleeding, pupils equal, reactive light Lungs: Coarse bilaterally, scattered crackles, no wheeze Heart: Distant, tachycardic 100 no murmur, regular Abdomen: obese, Nontender, nondistended, positive bowel sounds Extremities: 2+ edema disffuse Skin: No rash Neurologic: Wakes easily to voice, nods to questions.     Resolved Hospital Problem list    Mild elevated LFTs - transaminitis 02/15/2020 -resolved  Assessment & Plan:     Acute hypoxic respiratory failure in setting of Covid pneumonia, and ARDS, plus minus CAP versus aspiration  Baseline asthma nos hx Tracheostomy 116/10without complication Plan: -improved agitation -on atc, will rest on vent tonight and then as tolerated -Follow repeat respiratory culture -Continue cefazolin course total of 7 days ( should end today) -VAP prevention  orders -Bronchodilators as ordered  COVID-19   - s/p remedsivir  - sp steroids ended 12/24 plan -Baricitinib completed 12/30   Klebsiella urinary tract infection with sepsis syndrome with or without aspiration pneumonia - unasyn ended 02/11/20 Staph NOS VAP on 12/26 Plan -MSSA on respiratory culture, completed cefazolin 1/2 -Repeat respiratory culture performed given thick tan secretions, without considerable growth   Acute metabolic encephalopathy Chronic xanax and on suboxone and has hx of opioid OD in 2018, in the setting of sepsis and Covid infection.   Agitated with dyssynchrony almost to the point of coding on 12/26 Plan -cont risperdal -follow qt (especiallly with methadone/haldol use) -cont methadone, change dilaudid to po -cont klonopin -Wean Versed and fentanyl infusions more aggressively  Total body Volume overload ECHO normal 02/12/20.  Total I/O- 11.5L Plan -Continue scheduled Lasix but decrease dosing at this time to 275mdaily -follow indices and uop -currently with great diuresis  Hypophosphatemia, improved -Replaced 12/28 -  Anemia of critical illness Stable Plan -Follow CBC intermittently -Goal hemoglobin > 7.0  Prediabetes with Hyperglycemia Plan -Continue resistant sliding scale insulin  coverage -Continue Levemir as ordered -a1c 5.8   Best practice (evaluated daily)  Diet: Tube feeding   Pain/Anxiety/Delirium protocol (if indicated):oral and prn  VAP protocol (if indicated): per protocol  DVT prophylaxis: Lovenox  GI prophylaxis: PPI  Glucose control: Sliding scale insulin plus Levemir  Mobility: Bedrest currently requiring restraints  Disposition: Intensive care  Family:  Dad Donnie Hollinghead [phone] -> Updated patient's father by phone on 1/2 Sons are 16 (bret) and 21 (blake)  Goals of Care:  Last date of multidisciplinary goals of care discussion: via phone so not able to do multidisciplinary meeting  Family and  staff present: NP and mother dianne  Summary of discussion: Full code  Follow up goals of care discussion due: Goals of care discussion done 12/28 with consent for trach   Code Status: Full code    LABS    PULMONARY Recent Labs  Lab 02/17/20 1114 02/17/20 1730 02/17/20 2128 02/21/20 0813 02/22/20 0803  PHART 7.350 7.416 7.344* 7.429 7.457*  PCO2ART 55.6* 43.7 56.7* 51.5* 49.2*  PO2ART 285* 86 107 105 87  HCO3 30.3* 28.0 30.7* 34.1* 34.6*  TCO2 32 29 32 36* 36*  O2SAT 100.0 96.0 98.0 98.0 97.0    CBC Recent Labs  Lab 02/20/20 0332 02/21/20 0813 02/21/20 0820 02/22/20 0803 02/23/20 0603  HGB 9.6*   < > 9.4* 9.9* 10.2*  HCT 29.1*   < > 28.6* 29.0* 31.0*  WBC 11.7*  --  10.5  --  9.8  PLT 284  --  282  --  295   < > = values in this interval not displayed.    COAGULATION No results for input(s): INR in the last 168 hours.  CARDIAC  No results for input(s): TROPONINI in the last 168 hours. No results for input(s): PROBNP in the last 168 hours.   CHEMISTRY Recent Labs  Lab 02/17/20 2012 02/17/20 2128 02/18/20 9381 02/19/20 0233 02/20/20 0332 02/21/20 0813 02/21/20 0820 02/22/20 0803 02/23/20 0603  NA 131*   < > 133* 134* 136 136 137 139 143  K 4.2   < > 4.4 4.5 4.4 4.1 4.1 4.6 4.3  CL 95*  --  93* 98 95*  --  96*  --  98  CO2 25  --  27 30 28   --  29  --  32  GLUCOSE 263*  --  190* 169* 126*  --  135*  --  150*  BUN 12  --  12 12 19   --  16  --  23*  CREATININE 0.67  --  0.65 0.55 0.57  --  0.57  --  0.62  CALCIUM 8.0*  --  8.2* 8.2* 8.4*  --  8.7*  --  9.4  MG 2.0  --   --  2.3 2.2  --  2.3  --  2.6*  PHOS 3.4  --   --  2.4* 3.8  --  4.5  --   --    < > = values in this interval not displayed.   Estimated Creatinine Clearance: 105.1 mL/min (by C-G formula based on SCr of 0.62 mg/dL).   LIVER Recent Labs  Lab 02/17/20 2012 02/18/20 0332  AST 60* 40  ALT 79* 78*  ALKPHOS 85 93  BILITOT 0.8 0.8  PROT 7.0 7.2  ALBUMIN 2.8* 2.8*      INFECTIOUS Recent Labs  Lab 02/17/20 2012 02/18/20 0332  LATICACIDVEN 0.8 0.8  PROCALCITON  --  0.27  ENDOCRINE CBG (last 3)  Recent Labs    02/23/20 2342 02/24/20 0352 02/24/20 0741  GLUCAP 134* 134* 145*      Critical care time: The patient is critically ill with multiple organ systems failure and requires high complexity decision making for assessment and support, frequent evaluation and titration of therapies, application of advanced monitoring technologies and extensive interpretation of multiple databases.  Critical care time 33 mins. This represents my time independent of the NPs time taking care of the pt. This is excluding procedures.    Audria Nine DO [facility] 02/24/2020, 8:16 AM

## 2020-02-25 ENCOUNTER — Inpatient Hospital Stay (HOSPITAL_COMMUNITY): Payer: Medicaid Other

## 2020-02-25 ENCOUNTER — Encounter (HOSPITAL_COMMUNITY): Payer: Self-pay | Admitting: Pulmonary Disease

## 2020-02-25 DIAGNOSIS — G9341 Metabolic encephalopathy: Secondary | ICD-10-CM | POA: Diagnosis not present

## 2020-02-25 DIAGNOSIS — J9601 Acute respiratory failure with hypoxia: Secondary | ICD-10-CM | POA: Diagnosis not present

## 2020-02-25 DIAGNOSIS — R29818 Other symptoms and signs involving the nervous system: Secondary | ICD-10-CM | POA: Diagnosis not present

## 2020-02-25 DIAGNOSIS — R29898 Other symptoms and signs involving the musculoskeletal system: Secondary | ICD-10-CM

## 2020-02-25 DIAGNOSIS — G9389 Other specified disorders of brain: Secondary | ICD-10-CM | POA: Diagnosis not present

## 2020-02-25 DIAGNOSIS — M6281 Muscle weakness (generalized): Secondary | ICD-10-CM

## 2020-02-25 DIAGNOSIS — H748X3 Other specified disorders of middle ear and mastoid, bilateral: Secondary | ICD-10-CM | POA: Diagnosis not present

## 2020-02-25 DIAGNOSIS — J323 Chronic sphenoidal sinusitis: Secondary | ICD-10-CM | POA: Diagnosis not present

## 2020-02-25 LAB — GLUCOSE, CAPILLARY
Glucose-Capillary: 165 mg/dL — ABNORMAL HIGH (ref 70–99)
Glucose-Capillary: 120 mg/dL — ABNORMAL HIGH (ref 70–99)
Glucose-Capillary: 128 mg/dL — ABNORMAL HIGH (ref 70–99)
Glucose-Capillary: 80 mg/dL (ref 70–99)
Glucose-Capillary: 156 mg/dL — ABNORMAL HIGH (ref 70–99)
Glucose-Capillary: 116 mg/dL — ABNORMAL HIGH (ref 70–99)

## 2020-02-25 MED ORDER — ENOXAPARIN SODIUM 60 MG/0.6ML ~~LOC~~ SOLN
0.5000 mg/kg | SUBCUTANEOUS | Status: DC
  Administered 2020-02-25 – 2020-03-04 (×9): 47.5 mg via SUBCUTANEOUS
  Filled 2020-02-25 (×9): qty 0.6

## 2020-02-25 MED ORDER — HYDROMORPHONE HCL 2 MG PO TABS
4.0000 mg | ORAL_TABLET | Freq: Four times a day (QID) | ORAL | Status: DC
  Administered 2020-02-25 – 2020-02-28 (×11): 4 mg
  Filled 2020-02-25 (×11): qty 2

## 2020-02-25 NOTE — Progress Notes (Signed)
NAME:  Belinda [name], MRN:  [id number], DOB:  June 06, 1980, LOS: 32 ADMISSION DATE:  02/06/2020, CONSULTATION DATE:  12/16 REFERRING MD:  Josephine Cables ([facility]), CHIEF COMPLAINT:  Acute metabolic encephalopathy and respiratory failure in setting of COVID   BRIEF  40 year old white female, only medical history is obesity.  Presented to [facility] ER 12/15 w/ ARDS and acute metabolic encephalopathy 2/2 COVID   Past Medical History:  Obesity, anxiety, childhood asthma   (new info 12/26  - patient on suboxone and has hx of opioid OD in 2018) and hx of regular neb for [facility] Events:  12/15 presented to the emergency room, hypoxic, encephalopathic, Covid positive.CT brain negative for acute injury with only some mild para sinus mucosal thickening with some periodontal disease.  Started on supplemental oxygen, IV Solu-Medrol, IV remdesivir, and baricitinib.  Also started empirically on cefepime  and vancomycin to cover for potential bacterial pneumonia 12/16 multiple reports of worsening confusion, intermittent combativeness, attempts to bite staff.  Not able to take oral medications.  Started on Precedex.  Transferred to [facility]. Changed abx to unasyn, added low dose ativan as pt chronically on xanax.  12/17: Patient more hypoxic, requiring more sedation. Decision made to proceed with intubation.  Intubated, left IJ catheter placed.  PF ratio only 88.  Core track ordered.  Neuromuscular blockade initiated.  Prone protocol initiated.  12/19 - No overnight events but this morning hypertensive and tachycardic. EKG personally reviewed, sinus tachycardia with normal axis. BIS values are low in the 30s. She is still on continuous paralytics.    12/20 -neuromuscular blockade discontinued yesterday.  At this point in time patient is extremely agitated on the ventilator despite Dilaudid infusion Versed infusion, oxycodone schedule, clonopoin scheduloed,  and phenobarb once daily at night.   She is on prednisone and Barcitinib. On vent 50%. AFebrikle  12/21  - 12/21 - cxr horrible. 40% fio2, pulse ox 90%,.  On dilaudid gtt, oxycdone po, precedex gtt, versed gt and klonopin,  On phenobarb QHS x 2 night.  On haldol schedulesd x 1 dayt.   -. RASS -4 but easily goes to +2 per RN. On TF.  K 2.9 while on 109mkcl daily. + 4L volume overloa  12/22 - 40% fio2. On dilaudid gtt, [precedex gtt, versed gtt. On oxy and klonopin. Still very agitated On TF. -2L since admit   12/23 -> stil with significant intermittent agitation. On 30% fio2, Now on diprivan gtt, precedex gtt, fent gtt, versed gtt, levophed gtt. On vent, 30% fio2  12/24   - ? Low grade fever. 30% fio2 on vent. On fent gtt, prededex gtt, ketamine gtt, versed gtt . Also on levophed gtt. Some breakthrough agitation per RN but much better overall. Off diprivan gtt   12/26 -  On fent gtt, precedex gtt, ketamnie gtt, versed gtt. On TF. On free water.  Low grade fever + . On vent  - 30  12/28 Tracheostomy-paralytics off   Consults:    Procedures:  Intubation 12/17 > 12/28 Left IJ triple-lumen catheter 12/17 Trach (DS) 12/28 >>    Significant Diagnostic Tests:  CT brain 12/15: Negative for acute CVA or acute process Head CT 12/26 >> nothing acute  Micro Data:  MRSA PCR 12/15 urine culture>> 80,000 colonies GNR ->Klebsiella (resistant to ampi but sensitive to unasyn) Blood culture 12/15>>> Respiratory panel by RT-PCR 12/15: Positive for Covid xxxxx Trach aspirate 12/25 - staph aureus nos Trach aspirate 12/29 >  Few staph areus  Antimicrobials:  Remdesivir 12/15 Cefepime 12/15-->12/16 Vancomycin 12/15-->12/16 unasyn 12/16 (PCT 25, klbe uti) >>>12/20  covid Rx  - barcitinib - endds 12/30 or 12/29 - prednisone - ended 12/24  Vanc 12/26 >> 12/27 Cefazolin 12/27 >> 12/30   Interim History / Subjective:   No issues this morning on rounds however around 1050 was notified by physical therapy had a neurologic  change with left-sided weakness and disconjugate gaze.  Stat CT head has been ordered  Objective   Blood pressure 131/82, pulse (!) 112, temperature 99.3 F (37.4 C), temperature source Oral, resp. rate (!) 27, height 5' 3"  (1.6 m), weight 93.6 kg, SpO2 92 %.    Vent Mode: PRVC FiO2 (%):  [40 %-60 %] 40 % Set Rate:  [28 bmp] 28 bmp Vt Set:  [320 mL] 320 mL PEEP:  [5 cmH20] 5 cmH20 Plateau Pressure:  [11 cmH20] 11 cmH20   Intake/Output Summary (Last 24 hours) at 02/25/2020 8546 Last data filed at 02/25/2020 0700 Gross per 24 hour  Intake 2140.84 ml  Output 2690 ml  Net -549.16 ml   Filed Weights   02/23/20 0500 02/24/20 0400 02/25/20 0330  Weight: 94.1 kg 96 kg 93.6 kg    General Appearance: Patient remains critically ill, tracheostomy tube in place HEENT: Tracheostomy tube in place tracking appropriately Lungs: Anterior rhonchi, no crackles no wheeze Heart: Regular rate rhythm, S1-S2 Abdomen: Obese, soft nontender nondistended Extremities: Bilateral lower extremity edema Skin: No rash Neurologic: Moves all 4 extremities   Resolved Hospital Problem list    Mild elevated LFTs - transaminitis 02/15/2020 -resolved  Assessment & Plan:    Acute hypoxic respiratory failure in setting of Covid pneumonia, and ARDS, CAP versus aspiration  Baseline asthma nos hx Tracheostomy 27/03 without complication Plan: Continue trach collar trial as tolerated. Ancef to complete course, ended today.  COVID-19   - s/p remedsivir  - sp steroids ended 12/24 - Baricitinib completed 12/30  Klebsiella urinary tract infection with sepsis syndrome with or without aspiration pneumonia - unasyn ended 02/11/20 Staph NOS VAP on 12/26 Plan Resp Cx MSSA on respiratory culture, complete cefazolin 02/23/18/2022 No additional antibiotics at this time  Acute metabolic encephalopathy Chronic xanax and on suboxone and has hx of opioid OD in 2018, in the setting of sepsis and Covid infection.   Agitated  with dyssynchrony almost to the point of coding on 12/26 Plan -Continue respirdal  Follow QTC while on methadone and as needed Haldol  Total body Volume overload ECHO normal 02/12/20.  Total I/O- 11.5L Plan - lasix for diuresis   Hypophosphatemia, improved - replete as needed  Anemia of critical illness Stable Plan - follow hgb   Prediabetes with Hyperglycemia Plan - ssi + levemir    ** I was notified around 10:50 by PT that patient has new dysconjugate gaze and left weakness - stat CT has been ordered  - code stroke called after discussing with neurology    Best practice (evaluated daily)  Diet: Tube feeding   Pain/Anxiety/Delirium protocol (if indicated):oral and prn  VAP protocol (if indicated): per protocol  DVT prophylaxis: Lovenox  GI prophylaxis: PPI  Glucose control: Sliding scale insulin plus Levemir  Mobility: Bedrest currently requiring restraints  Disposition: Intensive care  Family:  Arlana Lindau Hollinghead [phone] -> Updated patient's father by phone on 1/2 Sons are 16 (bret) and 21 (blake)  Goals of Care:  Last date of multidisciplinary goals of care discussion: I spoke via phone to dad today 1/3 Family  and staff present:  Summary of discussion: Full code  Follow up goals of care discussion due: Code Status: Full code  LABS    PULMONARY Recent Labs  Lab 02/21/20 0813 02/22/20 0803  PHART 7.429 7.457*  PCO2ART 51.5* 49.2*  PO2ART 105 87  HCO3 34.1* 34.6*  TCO2 36* 36*  O2SAT 98.0 97.0    CBC Recent Labs  Lab 02/20/20 0332 02/21/20 0813 02/21/20 0820 02/22/20 0803 02/23/20 0603  HGB 9.6*   < > 9.4* 9.9* 10.2*  HCT 29.1*   < > 28.6* 29.0* 31.0*  WBC 11.7*  --  10.5  --  9.8  PLT 284  --  282  --  295   < > = values in this interval not displayed.    COAGULATION No results for input(s): INR in the last 168 hours.  CARDIAC  No results for input(s): TROPONINI in the last 168 hours. No results for input(s): PROBNP  in the last 168 hours.   CHEMISTRY Recent Labs  Lab 02/19/20 0233 02/20/20 0332 02/21/20 0813 02/21/20 0820 02/22/20 0803 02/23/20 0603  NA 134* 136 136 137 139 143  K 4.5 4.4 4.1 4.1 4.6 4.3  CL 98 95*  --  96*  --  98  CO2 30 28  --  29  --  32  GLUCOSE 169* 126*  --  135*  --  150*  BUN 12 19  --  16  --  23*  CREATININE 0.55 0.57  --  0.57  --  0.62  CALCIUM 8.2* 8.4*  --  8.7*  --  9.4  MG 2.3 2.2  --  2.3  --  2.6*  PHOS 2.4* 3.8  --  4.5  --   --    Estimated Creatinine Clearance: 103.7 mL/min (by C-G formula based on SCr of 0.62 mg/dL).   LIVER No results for input(s): AST, ALT, ALKPHOS, BILITOT, PROT, ALBUMIN, INR in the last 168 hours.   INFECTIOUS No results for input(s): LATICACIDVEN, PROCALCITON in the last 168 hours.   ENDOCRINE CBG (last 3)  Recent Labs    02/24/20 1941 02/24/20 2335 02/25/20 0315  GLUCAP 174* 134* 165*     Garner Nash, DO [place] [facility] 02/25/2020 7:14 AM

## 2020-02-25 NOTE — Evaluation (Signed)
[facility]y-[facility] Speaking Valve - Evaluation Patient Details  Name: Belinda [name] MRN: [id number] Date of Birth: 1980-11-15  Today's Date: 02/25/2020 Time: 0947-0962 SLP Time Calculation (min) (ACUTE ONLY): 19 min  Past Medical History: History reviewed. No pertinent past medical history. Past Surgical History: History reviewed. No pertinent surgical history. HPI:  Pt is a 40 year old female with medical history significant for obesity.  She presented to [facility] ED on 12/15 with 24 to 48 hours of AMS following exposure to her COVID+ son. CXR showed diffuse bilateral airspace disease, she was confused and agitated. Physical restraints applied on admission and Precedex infusion given.  She required 100% nonrebreather mask and was transferred to [facility] for further intervention. CXR 12/29: Improved bilateral ventilation with residual confluent bibasilar opacity. CT head negative. ETT 12/17-trach 12/28. Stat CT head completed on 1/3 due to increased weakness noted by PT and was negative.   Assessment / Plan / Recommendation Clinical Impression  Pt was seen for PMSV evaluation. She was alert and cooperative throughout the evaluation. Pt was educated regarding the anatomy of the larynx, the impact of the trach on voicing, and the goals of the session. Pt verbalized understanding. She tolerated cuff deflation well and tracheal suctioning was immediately performed by RN. Pt exhibited coughing upon deflation and was able to able to expectorate secretions and mucous via the trach. She presented with vitals of RR 25, SpO2 95, and HR 128 upon cuff deflation. Pt tolerated finger occlusion and mild air trapping was inconsistently noted during intermittent removal of PMSV. She tolerated PMSV for 13 minutes with vitals WFL ranging RR 17-20, SpO2 91-93, and HR 127-129. Pt was able to expectorate secretions orally following PMSV placement. Vocal intensity was low and quality severely breathy throughout the  evaluation, but she was able to intermittently demonstrate voicing with cueing. She exhibited difficulty coordinating respiration with speech and was mildly responsive to cueing to increase breath support. PMSV was ultimately removed due to pt's c/o fatigue and cuff was re-inflated by this SLP per RT's request. PMSV may be used with SLP only at this time. SLP will continue to follow pt. SLP Visit Diagnosis: Aphonia (R49.1)    SLP Assessment  Patient needs continued Speech Lanaguage Pathology Services    Follow Up Recommendations  Inpatient Rehab    Frequency and Duration min 2x/week  2 weeks    PMSV Trial PMSV was placed for: 13 Able to redirect subglottic air through upper airway: Yes Able to Attain Phonation: Yes Voice Quality: Breathy;Hoarse;Low vocal intensity;Aphonic Able to Expectorate Secretions: Yes Level of Secretion Expectoration with PMSV: Tracheal;Oral Breath Support for Phonation: Moderately decreased Intelligibility: Intelligibility reduced Word: 25-49% accurate Phrase: 0-24% accurate Sentence: 0-24% accurate Conversation: Not tested Respirations During Trial:  (17-20) SpO2 During Trial:  (91-95) Pulse During Trial:  (127-129) Behavior: Alert;Good eye contact;Responsive to questions;Cooperative   Tracheostomy Tube       Vent Dependency  FiO2 (%): 60 %    Cuff Deflation Trial    Rohn Fritsch I. Hardin Negus, MS, [place] Office number [phone] Pager [phone]  Tolerated Cuff Deflation: Yes Length of Time for Cuff Deflation Trial: 15 Behavior: Alert;Responsive to questions        Belinda [name] 02/25/2020, 5:49 PM

## 2020-02-25 NOTE — Progress Notes (Signed)
SLP Cancellation Note  Patient Details Name: Belinda [name] MRN: [id number] DOB: 06/14/1980   Cancelled treatment:       Reason Eval/Treat Not Completed: Patient at procedure or test/unavailable (Pt off unit for stat CT head. SLP will f/u)  Tobie Poet I. Hardin Negus, [place], [place] Office number [phone] Pager [place] 02/25/2020, 12:16 PM

## 2020-02-25 NOTE — Progress Notes (Signed)
PCCM:  Notified by physical therapy the patient has new left-sided weakness, disconjugate gaze.  Stat CT head ordered.  This was not present on evaluation this morning on rounds.   Garner Nash, DO  Pulmonary Critical Care 02/25/2020 10:50 AM

## 2020-02-25 NOTE — Progress Notes (Signed)
NEUROLOGY CONSULTATION NOTE   Date of service: February 25, 2020 Patient Name: Belinda [name] MRN:  [id number] DOB:  [birth year] Reason for consult: "Stroke code" _ _ _   _ __   _ __ _ _  __ __   _ __   __ _  History of Present Illness   Belinda [name] is a 40 y.o. female with PMH significant for obesity admitted with COVID Pneumonia and severe ARDS. Transferred to ICU for hypoxia and delirium and intubated. Been in the ICU for over 17 days now. Now on trach and was finally weaned off the Vent 2 days ago. Continues to be on multiple sedating medications including methadone, clonazepam, dilaudid.  She was noted today by PT to have some weakness in Left greater than Rigth and dysconjugate gaze.  A stroke code was called with a LKW of 0930.  MRS: 0(prior to hospitalization) NIHSS: 6 TPA: not given, symptoms felt to be less likely to be a stroke. Thrombectomy: Not pursued, symptoms felt to be unlikely to be a stroke.  NIHSS components Score: Comment  1a Level of Conscious 0[x]  1[]  2[]  3[]      1b LOC Questions 0[x]  1[]  2[]       1c LOC Commands 0[x]  1[]  2[]       2 Best Gaze 0[x]  1[]  2[]       3 Visual 0[x]  1[]  2[]  3[]      4 Facial Palsy 0[x]  1[]  2[]  3[]      5a Motor Arm - left 0[]  1[x]  2[]  3[]  4[]  UN[]    5b Motor Arm - Right 0[]  1[x]  2[]  3[]  4[]  UN[]    6a Motor Leg - Left 0[]  1[]  2[x]  3[]  4[]  UN[]    6b Motor Leg - Right 0[]  1[]  2[x]  3[]  4[]  UN[]    7 Limb Ataxia 0[x]  1[]  2[]  3[]  UN[]     8 Sensory 0[x]  1[]  2[]  UN[]      9 Best Language 0[x]  1[]  2[]  3[]      10 Dysarthria 0[]  1[]  2[]  UN[x]      11 Extinct. and Inattention 0[x]  1[]  2[]       TOTAL: 6       ROS   Denies any pain, feels generally weak all over.  Past History  History reviewed. No pertinent past medical history. The histories are not reviewed yet. Please review them in the "History" navigator section and refresh this [place]. No family history on file. Social History   Socioeconomic History  . Marital status:  Legally Separated    Spouse name: Not on file  . Number of children: Not on file  . Years of education: Not on file  . Highest education level: Not on file  Occupational History  . Not on file  Tobacco Use  . Smoking status: Not on file  . Smokeless tobacco: Not on file  Substance and Sexual Activity  . Alcohol use: Not on file  . Drug use: Not on file  . Sexual activity: Not on file  Other Topics Concern  . Not on file  Social History Narrative  . Not on file   Social Determinants of Health   Financial Resource Strain: Not on file  Food Insecurity: Not on file  Transportation Needs: Not on file  Physical Activity: Not on file  Stress: Not on file  Social Connections: Not on file   No Known Allergies  Medications   Medications Prior to Admission  Medication Sig Dispense Refill Last Dose  . albuterol (VENTOLIN HFA) 108 (90 Base) MCG/ACT inhaler  Inhale 2 puffs into the lungs every 6 (six) hours as needed for wheezing or shortness of breath.   UNK  . ALPRAZolam (XANAX) 1 MG tablet Take 1 mg by mouth 4 (four) times daily as needed for anxiety.   UNK  . cetirizine (ZYRTEC) 10 MG tablet Take 10 mg by mouth daily.   UNK  . clobetasol (TEMOVATE) 0.05 % external solution Apply 1 application topically 2 (two) times daily as needed (skin irritation).   UNK  . dexlansoprazole (DEXILANT) 60 MG capsule Take 60 mg by mouth daily.   UNK  . famotidine (PEPCID) 40 MG tablet Take 40 mg by mouth daily.   UNK  . fluticasone (FLONASE) 50 MCG/ACT nasal spray Place 2 sprays into both nostrils daily.   UNK  . fluticasone (FLOVENT HFA) 110 MCG/ACT inhaler Inhale 1 puff into the lungs 2 (two) times daily.   UNK  . linaclotide (LINZESS) 290 MCG CAPS capsule Take 290 mcg by mouth daily before breakfast.   UNK  . cefdinir (OMNICEF) 300 MG capsule Take 300 mg by mouth 2 (two) times daily. For 10 days (Patient not taking: Reported on 02/08/2020)   Completed Course at Unknown time     Vitals   Vitals:    02/25/20 0800 02/25/20 0900 02/25/20 1000 02/25/20 1100  BP: 113/90 127/85 (!) 124/92 118/80  Pulse: (!) 112 (!) 110 (!) 128 (!) 125  Resp: 18 (!) 24 (!) 30 (!) 24  Temp:      TempSrc:      SpO2: 90% 93% 96% (!) 86%  Weight:      Height:         Body mass index is 36.55 kg/m.  Physical Exam   General: Laying comfortably in bed; in no acute distress. HENT: Normal oropharynx and mucosa. Trach in place. Increased mucos production. Normal external appearance of ears and nose. Neck: Supple, no pain or tenderness CV: No JVD. No peripheral edema. Pulmonary: Symmetric Chest rise. Normal respiratory effort Abdomen: Soft to touch, non-tender. Ext: No cyanosis, edema, or deformity Skin: No rash. Normal palpation of skin.  Musculoskeletal: Normal digits and nails by inspection. No clubbing.   Neurologic Examination  Mental status/Cognition: Alert, oriented to self, place, month and year, good attention. Speech/language: Lurline Idol in place with no speech valve. Attempts to mouth words, comprehension intact. Able to follow commands.  Cranial nerves:   CN II Pupils equal and reactive to light, no VF deficits   CN III,IV,VI Does have mild L exophoria in primary gaze that disappears with extra-occular movements and with trying to look at object. No gaze preference or deviation, no nystagmus   CN V normal sensation in V1, V2, and V3 segments bilaterally   CN VII no asymmetry, no nasolabial fold flattening   CN VIII Turns towards speech and follows commands.   CN IX & X normal palatal elevation, no uvular deviation   CN XI 5/5 head turn and 5/5 shoulder shrug bilaterally   CN XII midline tongue protrusion   Motor:  Muscle bulk: poor, tone normal, pronator drift BL arm drift that improves with encouragement. tremor None Mvmt Root Nerve  Muscle Right Left Comments  SA C5/6 Ax Deltoid 4 4   EF C5/6 Mc Biceps 4 4   EE C6/7/8 Rad Triceps 4 4   WF C6/7 Med FCR     WE C7/8 PIN ECU     F Ab C8/T1  U ADM/FDI 4+ 4+   HF L1/2/3 Fem Illopsoas 3  3   KE L2/3/4 Fem Quad     DF L4/5 D [place] Tib Ant 4 4   PF S1/2 Tibial Grc/Sol 4 4    Reflexes:  Right Left Comments  Pectoralis      Biceps (C5/6) 2+ 2+   Brachioradialis (C5/6)      Triceps (C6/7)      Patellar (L3/4) 2+ 2+    Achilles (S1) 1 1    Hoffman      Plantar     Jaw jerk    Sensation:  Light touch Intact throughout   Pin prick    Temperature    Vibration   Proprioception    Coordination/Complex Motor:  - Finger to Nose intact BL - Heel to shin unable to do due to weakness but no obvious ataxia or tremor. Gait: deferred given weakness.  Labs   CBC:  Recent Labs  Lab 02/21/20 0820 02/22/20 0803 02/23/20 0603  WBC 10.5  --  9.8  NEUTROABS  --   --  6.8  HGB 9.4* 9.9* 10.2*  HCT 28.6* 29.0* 31.0*  MCV 94.7  --  96.0  PLT 282  --  659    Basic Metabolic Panel:  Lab Results  Component Value Date   NA 143 02/23/2020   K 4.3 02/23/2020   CO2 32 02/23/2020   GLUCOSE 150 (H) 02/23/2020   BUN 23 (H) 02/23/2020   CREATININE 0.62 02/23/2020   CALCIUM 9.4 02/23/2020   GFRNONAA >60 02/23/2020   Lipid Panel: No results found for: LDLCALC HgbA1c:  Lab Results  Component Value Date   HGBA1C 5.8 (H) 02/08/2020   Urine Drug Screen:     Component Value Date/Time   LABOPIA POSITIVE (A) 02/07/2020 1206   COCAINSCRNUR NONE DETECTED 02/07/2020 1206   LABBENZ POSITIVE (A) 02/07/2020 1206   AMPHETMU NONE DETECTED 02/07/2020 1206   THCU NONE DETECTED 02/07/2020 1206   LABBARB NONE DETECTED 02/07/2020 1206    Alcohol Level No results found for: [place]  CT Head without contrast: CTH was negative for a large hypodensity concerning for a large territory infarct or hyperdensity concerning for an ICH.  MRI Brain pending.  Impression   Belinda [name] is a 40 y.o. female admitted with COVID Pneumonia with a complicated hospital course including intubation, resistant hyperactive delirium and prolonged ICU course and  now trached and still continues to be on multiple sedating medications.  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 She has proximal greater than distal weakness in all of her extremities and the weakness is symmetric on my exam. As for the noted L dysconjugate gaze, this is more consistent with a very mild left exophoria (lazy eye) which is somewhat noticeable when she is just resting her eyes in primary gaze. This resolves when she is asked to look at an object and not notable with extra-occular movements.  At this time, my suspicion for a large stroke is low. I would recommend getting a routine MRI Brain without contrast when able as Covid is thrombogenic and that she might have a small lacunar infarct that can also rarely present with mild paresis of the extra-occular movements. That would however, be uncommon.  Recommendations  - Routine MRI Brain without contrast when able. ____________________________________________________________  Thank you for the opportunity to take part in the care of this patient. If you have any further questions, please contact the neurology consultation attending.  Signed,  [place] Pager Number [phone] _  _ _   _ __   _ __ _ _  __ __   _ __   __ _  

## 2020-02-25 NOTE — Progress Notes (Signed)
Notified by PT and OT that pt was having slight left sided weakness and disconjugate gaze with a complaint of blurry vision. MD notified and code stroke called.

## 2020-02-25 NOTE — Progress Notes (Addendum)
Physical Therapy Treatment Patient Details Name: Belinda [name] MRN: [id number] DOB: Jun 02, 1980 Today's Date: 02/25/2020    History of Present Illness 40 yo admitted 12/15 with AMS, fever, ARDS due to Covid 19 (+) and UTI. Intubated 12/17, trach 12/28. Pt with maintained agitation delirium since admission. PMhx: obesity    PT Comments    Pt seen in conjunction with OT with pt able to transition to EOB, standing and chair. Pt with dysconjugate gaze, decreased LUE strength and function (pt is LHD), pt report of decreased sensation LUE and LLE, right lean and diplopia all noted during session. Pt with generalized weakness bil LE grossly 3/5. Pt with decreased balance, function and awareness of deficits who remains appropriate for further therapy. PT also using sign language at times during session and reports she has a friend with hearing impairment.  Pt on trach collar FiO2 60% at 10 L with SPO2 92%, HR 138 Supine BP 124/92 EOB 101/89 Sitting in chair 116/83    Follow Up Recommendations  CIR;Supervision/Assistance - 24 hour     Equipment Recommendations  Other (comment) (TBD)    Recommendations for Other Services       Precautions / Restrictions Precautions Precautions: Fall Precaution Comments: trach, cortrak    Mobility  Bed Mobility Overal bed mobility: Needs Assistance Bed Mobility: Supine to Sit     Supine to sit: Mod assist;+2 for safety/equipment;HOB elevated     General bed mobility comments: mod assist to pivot to left side of bed with +2 for safety and lines. Pt with right lean in sitting with dysconjugate gaze. Pt with report of horizontal diplopia  Transfers Overall transfer level: Needs assistance   Transfers: Sit to/from Stand;Stand Pivot Transfers Sit to Stand: Mod assist;+2 safety/equipment;+2 physical assistance Stand pivot transfers: Mod assist;+2 physical assistance       General transfer comment: mod +2 assist to stand from bed x 2 trials with  bil knees blocked. Pt standing on tip toe and required cues to lower heels to surface. On second trial able to pivot and step toward right with assist  Ambulation/Gait             General Gait Details: did not attempt   Stairs             Wheelchair Mobility    Modified Rankin (Stroke Patients Only)       Balance Overall balance assessment: Needs assistance   Sitting balance-Leahy Scale: Poor Sitting balance - Comments: min-mod assist for balance with right lean and cues to achieve midline   Standing balance support: Bilateral upper extremity supported Standing balance-Leahy Scale: Poor Standing balance comment: bil UE support with maintained right lean                            Cognition Arousal/Alertness: Awake/alert Behavior During Therapy: WFL for tasks assessed/performed Overall Cognitive Status: Impaired/Different from baseline Area of Impairment: Orientation;Memory;Following commands;Safety/judgement;Attention                 Orientation Level: Disoriented to;Time;Situation;Place Current Attention Level: Focused Memory: Decreased short-term memory Following Commands: Follows one step commands inconsistently Safety/Judgement: Decreased awareness of deficits;Decreased awareness of safety     General Comments: pt able to use sign language to state beautiful when looking at balloon in room. Pt able to state name and following commands intermittently with difficulty with vision (diplopia) as well as LUE movement      Exercises  General Comments        Pertinent Vitals/Pain Pain Assessment: No/denies pain    Home Living                      Prior Function            PT Goals (current goals can now be found in the care plan section) Progress towards PT goals: Progressing toward goals    Frequency    Min 3X/week      PT Plan Current plan remains appropriate    Co-evaluation PT/OT/SLP  Co-Evaluation/Treatment: Yes Reason for Co-Treatment: Complexity of the patient's impairments (multi-system involvement) PT goals addressed during session: Mobility/safety with mobility;Balance        AM-PAC PT "6 Clicks" Mobility   Outcome Measure  Help needed turning from your back to your side while in a flat bed without using bedrails?: A Little Help needed moving from lying on your back to sitting on the side of a flat bed without using bedrails?: A Lot Help needed moving to and from a bed to a chair (including a wheelchair)?: A Lot Help needed standing up from a chair using your arms (e.g., wheelchair or bedside chair)?: A Lot Help needed to walk in hospital room?: Total Help needed climbing 3-5 steps with a railing? : Total 6 Click Score: 11    End of Session Equipment Utilized During Treatment: Gait belt Activity Tolerance: Patient tolerated treatment well Patient left: in chair;with call bell/phone within reach;with chair alarm set (with pillow positioned at right flank to encourage midline positioning) Nurse Communication: Mobility status PT Visit Diagnosis: Other abnormalities of gait and mobility (R26.89);Muscle weakness (generalized) (M62.81);Other symptoms and signs involving the nervous system (R29.898)     Time: 7972-8206 PT Time Calculation (min) (ACUTE ONLY): 40 min  Charges:  $Therapeutic Activity: 8-22 mins                     Yeraldy Spike P, PT Acute Rehabilitation Services Pager: [phone] Office: [place] 02/25/2020, 1:16 PM

## 2020-02-25 NOTE — Progress Notes (Signed)
Occupational Therapy Evaluation  Pt from home and was independent with ADL and mobility. Pt seen on TC  - 10L; FiO2 with desat briefly to 87 with mobility and max HR 148. BP stable. Able to progress to EOB with mod A +2 then stand pivot to chair with Mod A +2; Mod A with UB ADL and Max A with LB ADL duet o deficits listed below.  Pt with generalized weakness however appeared to have greater difficulty with coordination with L hand and reporting L hand and leg "feeling different". Pt also complaining of double vision and was overshooting when reaching for a target. Recommend rehab at [facility]. Will follow acutely.  Will need to call family for home set up/PLOF.     02/25/20 1600  OT Visit Information  Last OT Received On 02/25/20  Assistance Needed +2  PT/OT/SLP Co-Evaluation/Treatment Yes  Reason for Co-Treatment Complexity of the patient's impairments (multi-system involvement);For patient/therapist safety;To address functional/ADL transfers  OT goals addressed during session ADL's and self-care;Strengthening/ROM  History of Present Illness 40 yo admitted 12/15 with AMS, fever, ARDS due to Covid 19 (+) and UTI. Intubated 12/17, trach 12/28. Pt with maintained agitation delirium since admission. PMhx: obesity  Precautions  Precautions Fall  Precaution Comments trach, cortrak  Home Living  Family/patient expects to be discharged to: Private residence  [place]  Additional Comments pt unable to provide home setup or PLOF and no family present  Prior Function  Level of Independence Independent  Pain Assessment  Pain Assessment Faces  Faces Pain Scale 2  Pain Location general discomfort  Pain Descriptors / Indicators Discomfort  Pain Intervention(s) Limited activity within patient's tolerance  Cognition  Arousal/Alertness Awake/alert  Behavior During Therapy Flat affect  Overall Cognitive Status Impaired/Different from baseline  Area of Impairment  Orientation;Attention;Memory;Following commands;Safety/judgement;Awareness;Problem solving  Orientation Level  (difficult to assess due to communication)  Current Attention Level Sustained  Memory Decreased short-term memory  Following Commands Follows one step commands with increased time  Safety/Judgement Decreased awareness of safety;Decreased awareness of deficits  Awareness Intellectual  Problem Solving Slow processing;Decreased initiation;Difficulty sequencing;Requires verbal cues;Requires tactile cues  General Comments slow processing; redirectional cues at times; unaware of falling toward R without trying to correct posture  Upper Extremity Assessment  Upper Extremity Assessment RUE deficits/detail;LUE deficits/detail  RUE Deficits / Details shoulder 2+/5 - ablet o lift against gravity through full ROM; elbow @ 3/5; wirst/hand @ 3/5. poor in-hand manipulation skills due to weakness, however able to manipulate toothette; appears stronger than L  RUE Coordination decreased fine motor;decreased gross motor  LUE Deficits / Details Pt appears to have more difficulty with in-hand manipulation skills with L hand and reports that it "feels different"strength similar to R  LUE Sensation decreased light touch (pt reprots feels "different")  LUE Coordination decreased fine motor;decreased gross motor  Lower Extremity Assessment  Lower Extremity Assessment Defer to PT evaluation  Cervical / Trunk Assessment  Cervical / Trunk Assessment Other exceptions (R lateral bias)  ADL  Overall ADL's  Needs assistance/impaired  Eating/Feeding NPO  Grooming Moderate assistance;Oral care;Wash/dry face  Upper Body Bathing Sitting;Moderate assistance  Lower Body Bathing Maximal assistance;Bed level  Upper Body Dressing  Maximal assistance;Bed level  Lower Body Dressing Bed level;Maximal assistance  Lower Body Dressing Details (indicate cue type and reason) able to help donn socks using figure four  position  Functional mobility during ADLs Maximal assistance;+2 for physical assistance;Cueing for safety;Cueing for sequencing  Vision- History  Baseline Vision/History Wears glasses  Wears Glasses Reading only  Patient Visual Report Diplopia  Vision- Assessment  Vision Assessment? Vision impaired- to be further tested in functional context  Additional Comments dysconjugate gaze; reports seeing 2 images; overshooting when reaching for object; abnormal eye movement during cover/uncover test  Perception  Comments decreased awareness of mideline postrual alignement  Praxis  Praxis-Other Comments will further assess  Bed Mobility  Overal bed mobility Needs Assistance  Bed Mobility Supine to Sit  Rolling Min assist;+2 for safety/equipment  Supine to sit Mod assist;+2 for safety/equipment;HOB elevated  General bed mobility comments mod assist to pivot to left side of bed with +2 for safety and lines. Pt with right lean in sitting with dysconjugate gaze. Pt with report of horizontal diplopia  Transfers  Overall transfer level Needs assistance  Equipment used 2 person hand held assist  Transfers Sit to/from [employer] Transfers  Sit to Stand Mod assist;+2 safety/equipment;+2 physical assistance  Stand pivot transfers Mod assist;+2 physical assistance  General transfer comment mod +2 assist to stand from bed x 2 trials with bil knees blocked. Pt standing on tip toe and required cues to lower heels to surface. On second trial able to pivot and step toward right with assist  Balance  Overall balance assessment Needs assistance  Sitting balance-Leahy Scale Poor  Sitting balance - Comments min-mod assist for balance with right lean and cues to achieve midline  Standing balance support Bilateral upper extremity supported  Standing balance-Leahy Scale Poor  Standing balance comment bil UE support with maintained right lean  OT - End of Session  Equipment Utilized During Treatment Gait  belt;Oxygen (TC; 10L)  Activity Tolerance Patient tolerated treatment well  Patient left in chair;with call bell/phone within reach;with chair alarm set  Nurse Communication Mobility status  OT Assessment  OT Recommendation/Assessment Patient needs continued OT Services  OT Visit Diagnosis Unsteadiness on feet (R26.81);Other abnormalities of gait and mobility (R26.89);Muscle weakness (generalized) (M62.81);Low vision, both eyes (H54.2);Other symptoms and signs involving the nervous system (R29.898);Other symptoms and signs involving cognitive function;Pain  Pain - part of body  (generalized; coretrack appears to be bothering her)  OT Problem List Decreased strength;Decreased range of motion;Decreased activity tolerance;Impaired balance (sitting and/or standing);Impaired vision/perception;Decreased coordination;Decreased cognition;Decreased safety awareness;Decreased knowledge of use of DME or AE;Cardiopulmonary status limiting activity;Impaired sensation;Obesity;Impaired UE functional use;Pain  OT Plan  OT Frequency (ACUTE ONLY) Min 2X/week  OT Treatment/Interventions (ACUTE ONLY) Self-care/ADL training;Therapeutic exercise;Neuromuscular education;Energy conservation;DME and/or AE instruction;Therapeutic activities;Cognitive remediation/compensation;Visual/perceptual remediation/compensation;Patient/family education;Balance training  AM-PAC OT "6 Clicks" Daily Activity Outcome Measure (Version 2)  Help from another person eating meals? 1  Help from another person taking care of personal grooming? 2  Help from another person toileting, which includes using toliet, bedpan, or urinal? 1  Help from another person bathing (including washing, rinsing, drying)? 2  Help from another person to put on and taking off regular upper body clothing? 2  Help from another person to put on and taking off regular lower body clothing? 2  6 Click Score 10  OT Recommendation  Recommendations for Other Services  Rehab consult  Follow Up Recommendations CIR;Supervision/Assistance - 24 hour  OT Equipment 3 in 1 bedside commode;Other (comment) (Will further assess)  Individuals Consulted  Consulted and Agree with Results and Recommendations Patient  Acute Rehab OT Goals  Patient Stated Goal to get her hair brushed out adn get stronger  OT Goal Formulation With patient  Time For Goal Achievement 03/10/20  Potential to Achieve Goals Good  OT Time  Calculation  OT Start Time (ACUTE ONLY) 0955  OT Stop Time (ACUTE ONLY) 1033  OT Time Calculation (min) 38 min  OT General Charges  $OT Visit 1 Visit  OT Evaluation  $OT Eval Moderate Complexity 1 Mod  OT Treatments  $Self Care/Home Management  8-22 mins  Maurie Boettcher, OT/L   Acute OT Clinical Specialist [place] Pager [phone] Office [phone]

## 2020-02-25 NOTE — Progress Notes (Signed)
Brief Neuro Update:  MRI Brain completed and is negative for an acute stroke. 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  Recs: - Can do routine Pituitary protocol MRI Brain later or discuss with endocrine if relevant pituitary labs would be more helpful. - Neurology inpatient team will signoff. Please feel free to contact us with any questions or concerns.  [place] Pager Number [phone]

## 2020-02-26 DIAGNOSIS — U071 COVID-19: Secondary | ICD-10-CM | POA: Diagnosis not present

## 2020-02-26 DIAGNOSIS — J9601 Acute respiratory failure with hypoxia: Secondary | ICD-10-CM | POA: Diagnosis not present

## 2020-02-26 DIAGNOSIS — G9341 Metabolic encephalopathy: Secondary | ICD-10-CM | POA: Diagnosis not present

## 2020-02-26 LAB — CBC
HCT: 35.7 % — ABNORMAL LOW (ref 36.0–46.0)
Hemoglobin: 11 g/dL — ABNORMAL LOW (ref 12.0–15.0)
MCH: 30.2 pg (ref 26.0–34.0)
MCHC: 30.8 g/dL (ref 30.0–36.0)
MCV: 98.1 fL (ref 80.0–100.0)
Platelets: 275 10*3/uL (ref 150–400)
RBC: 3.64 MIL/uL — ABNORMAL LOW (ref 3.87–5.11)
RDW: 13.8 % (ref 11.5–15.5)
WBC: 8.6 10*3/uL (ref 4.0–10.5)
nRBC: 0 % (ref 0.0–0.2)

## 2020-02-26 LAB — GLUCOSE, CAPILLARY
Glucose-Capillary: 129 mg/dL — ABNORMAL HIGH (ref 70–99)
Glucose-Capillary: 140 mg/dL — ABNORMAL HIGH (ref 70–99)
Glucose-Capillary: 143 mg/dL — ABNORMAL HIGH (ref 70–99)
Glucose-Capillary: 111 mg/dL — ABNORMAL HIGH (ref 70–99)
Glucose-Capillary: 89 mg/dL (ref 70–99)
Glucose-Capillary: 150 mg/dL — ABNORMAL HIGH (ref 70–99)
Glucose-Capillary: 131 mg/dL — ABNORMAL HIGH (ref 70–99)

## 2020-02-26 LAB — BASIC METABOLIC PANEL
Anion gap: 13 (ref 5–15)
BUN: 49 mg/dL — ABNORMAL HIGH (ref 6–20)
CO2: 29 mmol/L (ref 22–32)
Calcium: 9.5 mg/dL (ref 8.9–10.3)
Chloride: 104 mmol/L (ref 98–111)
Creatinine, Ser: 0.81 mg/dL (ref 0.44–1.00)
GFR, Estimated: >60 mL/min (ref >60)
Glucose, Bld: 189 mg/dL — ABNORMAL HIGH (ref 70–99)
Potassium: 4.1 mmol/L (ref 3.5–5.1)
Sodium: 146 mmol/L — ABNORMAL HIGH (ref 135–145)

## 2020-02-26 LAB — MAGNESIUM: Magnesium: 2.7 mg/dL — ABNORMAL HIGH (ref 1.7–2.4)

## 2020-02-26 MED ORDER — FUROSEMIDE 10 MG/ML IJ SOLN
20.0000 mg | Freq: Every day | INTRAMUSCULAR | Status: DC
  Administered 2020-02-27 – 2020-03-01 (×4): 20 mg via INTRAVENOUS
  Filled 2020-02-26 (×4): qty 2

## 2020-02-26 MED ORDER — FREE WATER
200.0000 mL | Freq: Four times a day (QID) | Status: DC
  Administered 2020-02-26 – 2020-03-01 (×12): 200 mL

## 2020-02-26 NOTE — Progress Notes (Signed)
Physical Therapy Treatment Patient Details Name: EUSTACIA URBANEK MRN: [id number] DOB: 1980/02/28 Today's Date: 02/26/2020    History of Present Illness 40 yo admitted 12/15 with AMS, fever, ARDS due to Covid 19 (+) and UTI. Intubated 12/17, trach 12/28. Pt with maintained agitation delirium since admission. PMhx: obesity.  Scans negative for stroke.    PT Comments    Pt with less lateral lean today in sitting EOB, heel cords are tight, so difficult to get bil feet flat in standing without cues (pt reports discomfort).  Pt was lighter mod assist for transfer and I believe she is ready for pre gait and short distance gait with RW next session.  She tolerated multiple stands with VSS on 80% FiO2 TC and 10 L.  PT will continue to follow acutely for safe mobility progression.  Bring RW next session.    Follow Up Recommendations  CIR     Equipment Recommendations  Rolling walker with 5" wheels;3in1 (PT)    Recommendations for Other Services       Precautions / Restrictions Precautions Precautions: Fall Precaution Comments: trach, cortrak    Mobility  Bed Mobility Overal bed mobility: Needs Assistance Bed Mobility: Supine to Sit     Supine to sit: Min assist;+2 for physical assistance;HOB elevated     General bed mobility comments: Min assist to support trunk and encourage tactially movement of bil legs to EOB, mod assist to scoot.  Transfers Overall transfer level: Needs assistance Equipment used: 2 person hand held assist Transfers: Sit to/from [facility] Sit to Stand: Mod assist;+2 physical assistance Stand pivot transfers: Mod assist;+2 physical assistance       General transfer comment: Two person mod assist to stand EOB and transfer to chair on pt's left side.  Cues to keep feet flat on the ground (this is a strong heel cord stretch and pt reports pain).  Stood again from recliner chair, cues to push up with hands from chair, lighter mod assist on the  second trial.  I believe she will do well with RW and attempts at pre gait or short distance giat with chair to follow.  Ambulation/Gait                 Stairs             Wheelchair Mobility    Modified Rankin (Stroke Patients Only)       Balance Overall balance assessment: Needs assistance Sitting-balance support: Feet supported;Bilateral upper extremity supported Sitting balance-Leahy Scale: Poor Sitting balance - Comments: min assist once EOB and as good as close supervision, in bed while scooting with feet unsupported min guard to min assist due to posterior preference on compliant surface.  No leaning today.   Standing balance support: Bilateral upper extremity supported Standing balance-Leahy Scale: Poor Standing balance comment: needs support of Bil UEs and therapist/tech.                            Cognition Arousal/Alertness: Awake/alert Behavior During Therapy: Flat affect Overall Cognitive Status: Impaired/Different from baseline Area of Impairment: Memory;Attention;Following commands;Safety/judgement;Awareness;Problem solving                   Current Attention Level: Sustained Memory: Decreased short-term memory Following Commands: Follows one step commands with increased time Safety/Judgement: Decreased awareness of safety;Decreased awareness of deficits Awareness: Intellectual Problem Solving: Slow processing;Decreased initiation;Difficulty sequencing;Requires verbal cues;Requires tactile cues General Comments: Pt a bit slow  to process, decreased memory of how she got back to bed yesterday (could not remember if she stood or was lifted by the machine).  A bit slow to process which is amplified by difficulty communicating (some sign language, some lip reading as she has not yet been cleared to use PMV except with SLP).      Exercises      General Comments General comments (skin integrity, edema, etc.): Pt on 80% FiO2 and 10 L  VSS throughout HR in the 110s, BPs in the 130s.      Pertinent Vitals/Pain Pain Assessment: Faces Faces Pain Scale: Hurts little more Pain Location: general discomfort Pain Descriptors / Indicators: Grimacing;Guarding Pain Intervention(s): Limited activity within patient's tolerance;Monitored during session;Repositioned    Home Living                      Prior Function            PT Goals (current goals can now be found in the care plan section) Acute Rehab PT Goals Patient Stated Goal: none stated today Time For Goal Achievement: 03/06/20 Progress towards PT goals: Progressing toward goals    Frequency    Min 3X/week      PT Plan Current plan remains appropriate    Co-evaluation              AM-PAC PT "6 Clicks" Mobility   Outcome Measure  Help needed turning from your back to your side while in a flat bed without using bedrails?: A Little Help needed moving from lying on your back to sitting on the side of a flat bed without using bedrails?: A Little Help needed moving to and from a bed to a chair (including a wheelchair)?: A Lot Help needed standing up from a chair using your arms (e.g., wheelchair or bedside chair)?: A Lot Help needed to walk in hospital room?: A Lot Help needed climbing 3-5 steps with a railing? : Total 6 Click Score: 13    End of Session   Activity Tolerance: Patient limited by fatigue Patient left: in chair;with call bell/phone within reach;with chair alarm set Nurse Communication: Mobility status PT Visit Diagnosis: Other abnormalities of gait and mobility (R26.89);Muscle weakness (generalized) (M62.81);Other symptoms and signs involving the nervous system (R29.898)     Time: 1400-1430 PT Time Calculation (min) (ACUTE ONLY): 30 min  Charges:  $Therapeutic Activity: 23-37 mins                     Verdene Lennert, PT, DPT  Acute Rehabilitation [phone] pager [phone]) [phone] office

## 2020-02-26 NOTE — Evaluation (Signed)
Clinical/Bedside Swallow Evaluation Patient Details  Name: Belinda [name] MRN: [id number] Date of Birth: [birth year]  Today's Date: 02/26/2020 Time: SLP Start Time (ACUTE ONLY): 1450 SLP Stop Time (ACUTE ONLY): 1510 SLP Time Calculation (min) (ACUTE ONLY): 20 min  Past Medical History: History reviewed. No pertinent past medical history. Past Surgical History: History reviewed. No pertinent surgical history. HPI:  Pt is a 40 year old female with medical history significant for obesity.  She presented to [facility] ED on 12/15 with 24 to 48 hours of AMS following exposure to her COVID+ son. CXR showed diffuse bilateral airspace disease, she was confused and agitated. Physical restraints applied on admission and Precedex infusion given.  She required 100% nonrebreather mask and was transferred to [facility] for further intervention. CXR 12/29: Improved bilateral ventilation with residual confluent bibasilar opacity. CT head negative. ETT 12/17-trach 12/28. Stat CT head completed on 1/3 due to increased weakness noted by PT and was negative.   Assessment / Plan / Recommendation Clinical Impression  Pts swallow evaluated with PMSV in place. Oral function appeared subjectively normal, pt able to trigger swallow consistently. Consumed 2 oz of puree without signs of aspiration but had immediate coughing consistently with water. Pt reported applesauce was easier. Pt recommended to continue NPO until tomorrow, will plan on a FEES. Good prognosis for diet initaition SLP Visit Diagnosis: Dysphagia, unspecified (R13.10)    Aspiration Risk  Moderate aspiration risk    Diet Recommendation NPO;Alternative means - temporary        Other  Recommendations Oral Care Recommendations: Oral care BID   Follow up Recommendations Inpatient Rehab      Frequency and Duration            Prognosis        Swallow Study   General HPI: Pt is a 40 year old female with medical history significant for  obesity.  She presented to [facility] ED on 12/15 with 24 to 48 hours of AMS following exposure to her COVID+ son. CXR showed diffuse bilateral airspace disease, she was confused and agitated. Physical restraints applied on admission and Precedex infusion given.  She required 100% nonrebreather mask and was transferred to [facility] for further intervention. CXR 12/29: Improved bilateral ventilation with residual confluent bibasilar opacity. CT head negative. ETT 12/17-trach 12/28. Stat CT head completed on 1/3 due to increased weakness noted by PT and was negative. Type of Study: Bedside Swallow Evaluation Previous Swallow Assessment: none Diet Prior to this Study: NPO;NG Tube Temperature Spikes Noted: No Respiratory Status: Trach Collar History of Recent Intubation: Yes Length of Intubations (days): 11 days Date extubated: 02/19/20 Behavior/Cognition: Alert;Cooperative Oral Cavity Assessment: Within Functional Limits Oral Care Completed by SLP: No Oral Cavity - Dentition: Adequate natural dentition Vision: Functional for self-feeding Self-Feeding Abilities: Able to feed self Patient Positioning: Upright in chair Baseline Vocal Quality: Breathy;Hoarse Volitional Cough: Strong Volitional Swallow: Able to elicit    Oral/Motor/Sensory Function Overall Oral Motor/Sensory Function: Within functional limits   Ice Chips Ice chips: Impaired Pharyngeal Phase Impairments: Cough - Immediate   Thin Liquid Thin Liquid: Impaired Pharyngeal  Phase Impairments: Cough - Immediate    Nectar Thick Nectar Thick Liquid: Not tested   Honey Thick Honey Thick Liquid: Not tested   Puree Puree: Within functional limits   Solid     Solid: Not tested     Herbie Baltimore, MA [place] Pager [phone] Office [phone]   Lynann Beaver 02/26/2020,4:37 PM

## 2020-02-26 NOTE — Progress Notes (Signed)
PROGRESS NOTE                                                                             PROGRESS NOTE                                                                                                                                                                                                             Patient Demographics:    Belinda [name], is a 40 y.o. female, DOB - 09-12-1980, UJW:[id number]  Outpatient Primary MD for the patient is Kathyrn Drown, MD    LOS - 71  Admit date - 02/06/2020    Chief Complaint  Patient presents with   Altered Mental Status       Brief Narrative    40 year old white female, only medical history is obesity.  Presented to [facility] ER 12/15 w/ ARDS and acute metabolic encephalopathy 2/2 COVID   12/15 presented to the emergency room, hypoxic, encephalopathic, Covid positive.CT brain negative for acute injury with only some mild para sinus mucosal thickening with some periodontal disease.  Started on supplemental oxygen, IV Solu-Medrol, IV remdesivir, and baricitinib.  Also started empirically on cefepime  and vancomycin to cover for potential bacterial pneumonia 12/16 multiple reports of worsening confusion, intermittent combativeness, attempts to bite staff.  Not able to take oral medications.  Started on Precedex.  Transferred to [facility]. Changed abx to unasyn, added low dose ativan as pt chronically on xanax.  12/17: Patient more hypoxic, requiring more sedation. Decision made to proceed with intubation.  Intubated, left IJ catheter placed.  PF ratio only 88.  Core track ordered.  Neuromuscular blockade initiated.  Prone protocol initiated.  12/19 - No overnight events but this morning hypertensive and tachycardic. EKG personally reviewed, sinus tachycardia with normal axis. BIS values are low in the 30s. She is still on continuous paralytics.    12/20 -neuromuscular blockade discontinued yesterday.  At  this point in time patient is extremely agitated on the ventilator despite Dilaudid infusion Versed infusion, oxycodone schedule,  clonopoin scheduloed,  and phenobarb once daily at night.  She is on prednisone and Barcitinib. On vent 50%. AFebrikle  12/21  - 12/21 - cxr horrible. 40% fio2, pulse ox 90%,.  On dilaudid gtt, oxycdone po, precedex gtt, versed gt and klonopin,  On phenobarb QHS x 2 night.  On haldol schedulesd x 1 dayt.   -. RASS -4 but easily goes to +2 per RN. On TF.  K 2.9 while on 61mkcl daily. + 4L volume overloa  12/22 - 40% fio2. On dilaudid gtt, [precedex gtt, versed gtt. On oxy and klonopin. Still very agitated On TF. -2L since admit   12/23 -> stil with significant intermittent agitation. On 30% fio2, Now on diprivan gtt, precedex gtt, fent gtt, versed gtt, levophed gtt. On vent, 30% fio2  12/24   - ? Low grade fever. 30% fio2 on vent. On fent gtt, prededex gtt, ketamine gtt, versed gtt . Also on levophed gtt. Some breakthrough agitation per RN but much better overall. Off diprivan gtt   12/26 -  On fent gtt, precedex gtt, ketamnie gtt, versed gtt. On TF. On free water.  Low grade fever + . On vent  - 30  12/28 Tracheostomy-paralytics off    Subjective:    Belinda [name] with no significant events overnight, yesterday morning she had a Code stroke called for disconjugate gaze, seen by neurology, MRI with no CVA.   Assessment  & Plan :    Active Problems:   Pneumonia due to COVID-19 virus   Sepsis ([place]   Acute metabolic encephalopathy   Hypokalemia   Hypoalbuminemia   Elevated liver enzymes   COVID-19   Acute respiratory failure (HCC)   Acute hypoxic respiratory failure in setting of Covid pneumonia, and ARDS, CAP versus aspiration  - Baseline asthma nos hx - Tracheostomy 192/33without complication -Continue to wean as tolerated, throat related ATC all day yesterday, back on the vent for 6 hours overnight, this morning she is back on ATC,  10 L 98% -Treated with Ancef for presumed  pneumonia.  COVID-19   - s/p remedsivir  - sp steroids ended 12/24 -  Baricitinib completed 12/30 -May discontinue Covid isolation 02/28/2020  Klebsiella urinary tract infection with sepsis syndrome with or without aspiration pneumonia  - unasyn ended 02/11/20 - Staph NOS VAP on 12/26 - Resp Cx MSSA on respiratory culture, complete cefazolin 02/23/18/2022 - No additional antibiotics at this time -She is afebrile, no leukocytosis  Acute metabolic encephalopathy - Chronic xanax and on suboxone and has hx of opioid OD in 2018, in the setting of sepsis and Covid infection.  -Agitated with dyssynchrony almost to the point of coding on 12/26 -Patient appears to be better on Risperdal -Continue respirdal  - Follow QTC while on methadone and as needed Haldol  Total body Volume overload ECHO normal 02/12/20.  Total I/O- 11.5L Plan - lasix for diuresis   Hypophosphatemia, improved - replete as needed  Anemia of critical illness Stable - follow hgb   Prediabetes with Hyperglycemia - ssi + levemir   Hypernatrmia - will add free water via NGT.    SpO2: 100 % O2 Flow Rate (L/min): 10 L/min FiO2 (%): 60 %  Recent Labs  Lab 02/20/20 0332 02/21/20 0820 02/23/20 0603 02/26/20 0400  WBC 11.7* 10.5 9.8 8.6  PLT 284 282 295 275    Vent Mode: PRVC FiO2 (%):  [50 %-60 %] 60 % Set Rate:  [28 bmp] 28 bmp Vt Set:  [320  mL] 320 mL PEEP:  [5 cmH20] 5 cmH20  ABG     Component Value Date/Time   PHART 7.457 (H) 02/22/2020 0803   PCO2ART 49.2 (H) 02/22/2020 0803   PO2ART 87 02/22/2020 0803   HCO3 34.6 (H) 02/22/2020 0803   TCO2 36 (H) 02/22/2020 0803   ACIDBASEDEF 1.8 02/07/2020 0912   O2SAT 97.0 02/22/2020 0803         Condition - Guarded  Family Communication  :  Father updated by phone 02/26/2020  Code Status :  Full  Consults  :  PCCM, Neurology  Procedures  :  Lurline Idol  Disposition Plan  :    Status is:  Inpatient  Remains inpatient appropriate because:Hemodynamically unstable and IV treatments appropriate due to intensity of illness or inability to take PO   Dispo: The patient is from: Home              Anticipated d/c is to: SNF              Anticipated d/c date is: > 3 days              Patient currently is not medically stable to d/c.      DVT Prophylaxis  :  Lovenox  Lab Results  Component Value Date   PLT 275 02/26/2020    Diet :  Diet Order            Diet NPO time specified  Diet effective midnight                  Inpatient Medications  Scheduled Meds:  vitamin C  500 mg Per Tube Daily   chlorhexidine gluconate (MEDLINE KIT)  15 mL Mouth Rinse BID   Chlorhexidine Gluconate Cloth  6 each Topical Daily   clonazePAM  2.5 mg Per Tube BID   enoxaparin (LOVENOX) injection  0.5 mg/kg Subcutaneous Q24H   feeding supplement (PROSource TF)  45 mL Per Tube TID   furosemide  40 mg Intravenous Daily   HYDROmorphone  4 mg Per Tube Q6H   insulin aspart  0-20 Units Subcutaneous Q4H   insulin aspart  5 Units Subcutaneous Q4H   insulin detemir  10 Units Subcutaneous BID   mouth rinse  15 mL Mouth Rinse 10 times per day   melatonin  3 mg Per Tube QHS   methadone  20 mg Per Tube Q12H   midazolam  5 mg Intravenous Once   multivitamin with minerals  1 tablet Per Tube Daily   pantoprazole sodium  40 mg Per Tube Daily   potassium chloride  20 mEq Per Tube Daily   risperiDONE  3 mg Per Tube QHS   sodium chloride flush  10-40 mL Intracatheter Q12H   thiamine injection  100 mg Intravenous Daily   zinc sulfate  220 mg Per Tube Daily   Continuous Infusions:  sodium chloride Stopped (02/25/20 0849)   feeding supplement (VITAL 1.5 CAL) 1,000 mL (02/26/20 0404)   lactated ringers 10 mL/hr at 02/08/20 1118   PRN Meds:.sodium chloride, acetaminophen, docusate, fentaNYL (SUBLIMAZE) injection, ipratropium-albuterol, LORazepam, midazolam, polyethylene  glycol, sodium chloride flush  Antibiotics  :    Anti-infectives (From admission, onward)   Start     Dose/Rate Route Frequency Ordered Stop   02/18/20 1200  ceFAZolin (ANCEF) IVPB 2g/100 mL premix        2 g 200 mL/hr over 30 Minutes Intravenous Every 8 hours 02/18/20 1104 02/23/20 2044   02/17/20 2000  vancomycin (VANCOCIN) IVPB 1000 mg/200 mL premix  Status:  Discontinued        1,000 mg 200 mL/hr over 60 Minutes Intravenous Every 8 hours 02/17/20 1146 02/18/20 1104   02/17/20 1345  vancomycin (VANCOREADY) IVPB 2000 mg/400 mL        2,000 mg 200 mL/hr over 120 Minutes Intravenous  Once 02/17/20 1257 02/17/20 1752   02/17/20 1245  vancomycin (VANCOCIN) 2,000 mg in sodium chloride 0.9 % 500 mL IVPB  Status:  Discontinued        2,000 mg 250 mL/hr over 120 Minutes Intravenous  Once 02/17/20 1146 02/17/20 1445   02/07/20 1600  Ampicillin-Sulbactam (UNASYN) 3 g in sodium chloride 0.9 % 100 mL IVPB        3 g 200 mL/hr over 30 Minutes Intravenous Every 6 hours 02/07/20 1513 02/11/20 2145   02/07/20 1000  remdesivir 100 mg in sodium chloride 0.9 % 100 mL IVPB        100 mg 200 mL/hr over 30 Minutes Intravenous Daily 02/06/20 2132 02/10/20 1103   02/07/20 0800  vancomycin (VANCOCIN) IVPB 750 mg/150 ml premix  Status:  Discontinued        750 mg 150 mL/hr over 60 Minutes Intravenous Every 12 hours 02/06/20 2023 02/07/20 1510   02/07/20 0400  ceFEPIme (MAXIPIME) 2 g in sodium chloride 0.9 % 100 mL IVPB  Status:  Discontinued        2 g 200 mL/hr over 30 Minutes Intravenous Every 8 hours 02/06/20 2023 02/07/20 1510   02/06/20 2145  remdesivir 100 mg in sodium chloride 0.9 % 100 mL IVPB        100 mg 200 mL/hr over 30 Minutes Intravenous Every 30 min 02/06/20 2132 02/06/20 2305   02/06/20 1915  vancomycin (VANCOREADY) IVPB 2000 mg/400 mL        2,000 mg 200 mL/hr over 120 Minutes Intravenous  Once 02/06/20 1907 02/06/20 2250   02/06/20 1845  ceFEPIme (MAXIPIME) 2 g in sodium chloride 0.9 %  100 mL IVPB        2 g 200 mL/hr over 30 Minutes Intravenous  Once 02/06/20 1834 02/06/20 2056   02/06/20 1845  metroNIDAZOLE (FLAGYL) IVPB 500 mg        500 mg 100 mL/hr over 60 Minutes Intravenous  Once 02/06/20 1834 02/06/20 2204   02/06/20 1845  vancomycin (VANCOCIN) IVPB 1000 mg/200 mL premix  Status:  Discontinued        1,000 mg 200 mL/hr over 60 Minutes Intravenous  Once 02/06/20 1834 02/06/20 1907       Rollie Hynek M.D on 02/26/2020 at 10:28 AM  To page go to [email or web address]   [facility] -  Office  [phone]     Objective:   Vitals:   02/26/20 0746 02/26/20 0800 02/26/20 0900 02/26/20 1000  BP: 121/79 115/81 112/88 (!) 130/94  Pulse: (!) 110 (!) 110 (!) 112 (!) 108  Resp: (!) 28 19 14  (!) 21  Temp:      TempSrc:      SpO2: 93% 94% 93% 100%  Weight:      Height:        Wt Readings from Last 3 Encounters:  02/26/20 93.1 kg     Intake/Output Summary (Last 24 hours) at 02/26/2020 1028 Last data filed at 02/26/2020 1000 Gross per 24 hour  Intake 1685 ml  Output 2275 ml  Net -590 ml     Physical Exam  Is awake, follow commands,  trying to verbalize words  Trach with no significant discharge or oozing, on ATC Fair air entry bilaterally with scattered rales. RRR,No Gallops,Rubs or new Murmurs, No Parasternal Heave +ve B.Sounds, Abd Soft, No tenderness No Cyanosis, Clubbing or edema, No new Rash or bruise      Data Review:    CBC Recent Labs  Lab 02/20/20 0332 02/21/20 0813 02/21/20 0820 02/22/20 0803 02/23/20 0603 02/26/20 0400  WBC 11.7*  --  10.5  --  9.8 8.6  HGB 9.6* 8.8* 9.4* 9.9* 10.2* 11.0*  HCT 29.1* 26.0* 28.6* 29.0* 31.0* 35.7*  PLT 284  --  282  --  295 275  MCV 95.1  --  94.7  --  96.0 98.1  MCH 31.4  --  31.1  --  31.6 30.2  MCHC 33.0  --  32.9  --  32.9 30.8  RDW 13.8  --  13.6  --  13.8 13.8  LYMPHSABS  --   --   --   --  1.9  --   MONOABS  --   --   --   --  0.8  --   EOSABS  --   --   --   --  0.1  --    BASOSABS  --   --   --   --  0.0  --     Recent Labs  Lab 02/20/20 0332 02/21/20 0813 02/21/20 0820 02/22/20 0803 02/23/20 0603 02/26/20 0400  NA 136 136 137 139 143 146*  K 4.4 4.1 4.1 4.6 4.3 4.1  CL 95*  --  96*  --  98 104  CO2 28  --  29  --  32 29  GLUCOSE 126*  --  135*  --  150* 189*  BUN 19  --  16  --  23* 49*  CREATININE 0.57  --  0.57  --  0.62 0.81  CALCIUM 8.4*  --  8.7*  --  9.4 9.5  MG 2.2  --  2.3  --  2.6* 2.7*    ------------------------------------------------------------------------------------------------------------------ No results for input(s): CHOL, HDL, LDLCALC, TRIG, CHOLHDL, LDLDIRECT in the last 72 hours.  Lab Results  Component Value Date   HGBA1C 5.8 (H) 02/08/2020   ------------------------------------------------------------------------------------------------------------------ No results for input(s): TSH, T4TOTAL, T3FREE, THYROIDAB in the last 72 hours.  Invalid input(s): FREET3  Cardiac Enzymes No results for input(s): CKMB, TROPONINI, MYOGLOBIN in the last 168 hours.  Invalid input(s): CK ------------------------------------------------------------------------------------------------------------------ No results found for: BNP  Micro Results Recent Results (from the past 240 hour(s))  Culture, respiratory (non-expectorated)     Status: None   Collection Time: 02/16/20 12:43 PM   Specimen: Tracheal Aspirate; Respiratory  Result Value Ref Range Status   Specimen Description TRACHEAL ASPIRATE  Final   Special Requests NONE  Final   Gram Stain   Final    FEW WBC PRESENT,BOTH PMN AND MONONUCLEAR FEW GRAM POSITIVE COCCI Performed at [facility], [place] [address], [place],  [address]    Culture FEW STAPHYLOCOCCUS AUREUS  Final   Report Status 02/18/2020 FINAL  Final   Organism ID, Bacteria STAPHYLOCOCCUS AUREUS  Final      Susceptibility   Staphylococcus aureus - MIC*    CIPROFLOXACIN <=0.5 SENSITIVE Sensitive      ERYTHROMYCIN <=0.25 SENSITIVE Sensitive     GENTAMICIN <=0.5 SENSITIVE Sensitive     OXACILLIN <=0.25 SENSITIVE Sensitive     TETRACYCLINE <=1 SENSITIVE Sensitive     VANCOMYCIN 1  SENSITIVE Sensitive     TRIMETH/SULFA <=10 SENSITIVE Sensitive     CLINDAMYCIN <=0.25 SENSITIVE Sensitive     RIFAMPIN <=0.5 SENSITIVE Sensitive     Inducible Clindamycin NEGATIVE Sensitive     * FEW STAPHYLOCOCCUS AUREUS  Culture, respiratory (non-expectorated)     Status: None   Collection Time: 02/20/20 10:58 AM   Specimen: Tracheal Aspirate; Respiratory  Result Value Ref Range Status   Specimen Description TRACHEAL ASPIRATE  Final   Special Requests NONE  Final   Gram Stain   Final    NO WBC SEEN RARE GRAM POSITIVE COCCI IN PAIRS Performed at [facility], [address], [place], [place] [address]    Culture FEW STAPHYLOCOCCUS AUREUS  Final   Report Status 02/22/2020 FINAL  Final   Organism ID, Bacteria STAPHYLOCOCCUS AUREUS  Final      Susceptibility   Staphylococcus aureus - MIC*    CIPROFLOXACIN <=0.5 SENSITIVE Sensitive     ERYTHROMYCIN <=0.25 SENSITIVE Sensitive     GENTAMICIN <=0.5 SENSITIVE Sensitive     OXACILLIN 0.5 SENSITIVE Sensitive     TETRACYCLINE <=1 SENSITIVE Sensitive     VANCOMYCIN <=0.5 SENSITIVE Sensitive     TRIMETH/SULFA <=10 SENSITIVE Sensitive     CLINDAMYCIN <=0.25 SENSITIVE Sensitive     RIFAMPIN <=0.5 SENSITIVE Sensitive     Inducible Clindamycin NEGATIVE Sensitive     * FEW STAPHYLOCOCCUS AUREUS    Radiology Reports DG Chest 1 View  Result Date: 02/08/2020 CLINICAL DATA:  OG tube placement. EXAM: CHEST  1 VIEW COMPARISON:  Chest x-ray 02/07/2020. FINDINGS: 539 hours. Bilateral airspace disease noted, left greater than right. Endotracheal tube tip is approximately 4.3 cm above the base of the carina. Left IJ central line tip overlies the innominate vein confluence. NG tube tip is positioned in the stomach with the proximal side port below the GE junction.  Telemetry leads overlie the chest. IMPRESSION: NG tube tip is in the stomach with proximal side port of the tube below the GE junction. Electronically Signed   By: Misty Stanley M.D.   On: 02/08/2020 05:54   CT HEAD WO CONTRAST  Result Date: 02/18/2020 CLINICAL DATA:  Delirium. Altered mental status. EXAM: CT HEAD WITHOUT CONTRAST TECHNIQUE: Contiguous axial images were obtained from the base of the skull through the vertex without intravenous contrast. COMPARISON:  CT head without contrast 09/01/2017 at [facility]. FINDINGS: Brain: No acute infarct, hemorrhage, or mass lesion is present. No significant white matter lesions are present. The ventricles are of normal size. No significant extraaxial fluid collection is present. The brainstem and cerebellum are within normal limits. Vascular: No hyperdense vessel or unexpected calcification. Skull: Calvarium is intact. No focal lytic or blastic lesions are present. No significant extracranial soft tissue lesion is present. Sinuses/Orbits: Patient is intubated. Fluid level is present in the right maxillary sinus. Fluid is present in the nasopharynx. Fluid is present in the sphenoid sinuses and posterior ethmoid air cells bilaterally. Left nasogastric tube is noted. Minimal mastoid fluid is present inferiorly on both sides. IMPRESSION: 1. Normal CT appearance of the brain. 2. Fluid in the paranasal sinuses and nasopharynx likely related to intubation. 3. Minimal mastoid fluid inferiorly on both sides. Electronically Signed   By: San Morelle M.D.   On: 02/18/2020 02:16   CT Head Wo Contrast  Result Date: 02/06/2020 CLINICAL DATA:  Altered mental status.  Recent tooth extraction. EXAM: CT HEAD WITHOUT CONTRAST TECHNIQUE: Contiguous axial images were obtained from the  base of the skull through the vertex without intravenous contrast. COMPARISON:  Head CT 09/01/2017 FINDINGS: Brain: No intracranial hemorrhage, mass effect, or midline shift. No  hydrocephalus. The basilar cisterns are patent. No evidence of territorial infarct or acute ischemia. No extra-axial or intracranial fluid collection. Vascular: No hyperdense vessel or unexpected calcification. Skull: No fracture or focal lesion. Sinuses/Orbits: Mucosal thickening of the right greater than left maxillary sinus. Scattered mucosal thickening throughout ethmoid air cells. Patient appears edentulous of upper teeth, presumed prior periapical lucency in the right upper molars, contiguous with the right maxillary sinus. No acute orbital abnormality. Mastoid air cells are clear. Other: None. IMPRESSION: 1. No acute intracranial abnormality. 2. Paranasal sinus disease. 3. Patient appears edentulous of upper teeth, presumed prior periapical lucency in the right upper molars, contiguous with the right maxillary sinus. CLINICAL DATA:  Altered mental status. Recent tooth extraction. EXAM: CT HEAD WITHOUT CONTRAST TECHNIQUE: Contiguous axial images were obtained from the base of the skull through the vertex without intravenous contrast. COMPARISON:  Head CT 09/01/2017 FINDINGS: Brain: No intracranial hemorrhage, mass effect, or midline shift. No hydrocephalus. The basilar cisterns are patent. No evidence of territorial infarct or acute ischemia. No extra-axial or intracranial fluid collection. Vascular: No hyperdense vessel or unexpected calcification. Skull: No fracture or focal lesion. Sinuses/Orbits: Mucosal thickening of the right greater than left maxillary sinus. Scattered mucosal thickening throughout ethmoid air cells. Patient appears edentulous of upper teeth, presumed prior periapical lucency in the right upper molars, contiguous with the right maxillary sinus. No acute orbital abnormality. Mastoid air cells are clear. Other: None. IMPRESSION: 1. No acute intracranial abnormality. 2. Paranasal sinus mucosal thickening. 3. Patient appears edentulous of upper teeth, presumed prior periodontal disease in  the right upper molars, contiguous with the right maxillary sinus. Electronically Signed   By: Keith Rake M.D.   On: 02/06/2020 22:18   MR BRAIN WO CONTRAST  Result Date: 02/25/2020 CLINICAL DATA:  Neuro deficit, acute, stroke suspected. Additional history provided: COVID positive. EXAM: MRI HEAD WITHOUT CONTRAST TECHNIQUE: Multiplanar, multiecho pulse sequences of the brain and surrounding structures were obtained without intravenous contrast. COMPARISON:  Noncontrast head CT 02/25/2020. FINDINGS: Brain: Intermittently motion degraded exam. Most notably, there is moderate motion degradation of the axial T2/FLAIR sequence. Small focus of chronic cortical encephalomalacia within the anterolateral right frontal lobe (for instance as seen on series 7, images 14-16). 2 x 10 mm (AP x TV) T1 hyperintense lesion within the mid to posterior pituitary gland (for instance as seen on series 10, image 36). No focal parenchymal signal abnormality is identified elsewhere. There is no acute infarct. No extra-axial fluid collection. No midline shift. Vascular: Expected proximal arterial flow voids. Skull and upper cervical spine: No focal marrow lesion. Sinuses/Orbits: Visualized orbits show no acute finding. Paranasal sinus disease. Most notably, there is extensive partial opacification of the bilateral sphenoid sinuses and moderate right maxillary sinus mucosal thickening. Other: Small bilateral mastoid effusions. IMPRESSION: 1. Intermittently motion degraded exam. 2. No evidence of acute infarct. 3. Small focus of chronic cortical encephalomalacia within the anterolateral right frontal lobe. 4. 2 x 10 mm T1 hyperintense lesion within the mid-to-posterior pituitary gland. 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 However, a cystic adenoma or adenoma with prior hemorrhage cannot be excluded. Correlate with relevant laboratory values. A pituitary protocol brain MRI may be obtained for further  characterization, if clinically warranted. 5. Otherwise unremarkable non-contrast MRI appearance of the brain. 6. Paranasal sinus disease, most  notably severe bilateral sphenoid sinusitis. 7. Small bilateral mastoid effusions. Electronically Signed   By: Kellie Simmering DO   On: 02/25/2020 17:45   DG Chest Port 1 View  Result Date: 02/20/2020 CLINICAL DATA:  40 year old female COVID-40. EXAM: PORTABLE CHEST 1 VIEW COMPARISON:  Portable chest 02/19/2020 and earlier. FINDINGS: Portable AP semi upright view at 0448 hours. Stable tracheostomy tube, left IJ approach central line and visible enteric tube. Larger lung volumes and improved ventilation. Largely regressed left upper lung opacity since yesterday. Residual confluent bibasilar opacity, including suspected left lower lobe collapse or consolidation. No pneumothorax. No definite pleural effusion. No acute osseous abnormality identified. Paucity of bowel gas in the upper abdomen. IMPRESSION: 1. Improved bilateral ventilation with residual confluent bibasilar opacity. 2. No new cardiopulmonary abnormality identified. 3.  Stable lines and tubes. Electronically Signed   By: Genevie Ann M.D.   On: 02/20/2020 07:03   DG Chest Port 1 View  Result Date: 02/19/2020 CLINICAL DATA:  Post tracheostomy, COVID positive EXAM: PORTABLE CHEST 1 VIEW COMPARISON:  Earlier same day FINDINGS: Tracheostomy device is present approximately 5 cm above the carina. Left IJ central line and enteric tube are again identified. Persistent bilateral pulmonary opacities similar to the prior study. Probable small pleural effusions. No pneumothorax. Similar cardiomediastinal contours. IMPRESSION: New tracheostomy device. Otherwise stable lines and tubes. Similar bilateral pulmonary opacities. Electronically Signed   By: Macy Mis M.D.   On: 02/19/2020 16:48   DG Chest Port 1 View  Result Date: 02/19/2020 CLINICAL DATA:  Hypoxia.  Respiratory failure.  COVID-19. EXAM: PORTABLE CHEST 1  VIEW COMPARISON:  02/17/2020. FINDINGS: Endotracheal tube, feeding tube, left IJ line in stable position. Cardiomegaly with pulmonary venous congestion. Bilateral interstitial infiltrates again noted. Bibasilar atelectasis. Small bilateral pleural effusions again noted. No pneumothorax. IMPRESSION: 1. Lines and tubes in stable position. 2. Cardiomegaly with pulmonary venous congestion. 3. Bilateral interstitial infiltrates again noted. Bibasilar atelectasis. Small bilateral pleural effusions again noted. Electronically Signed   By: Marcello Moores  Register   On: 02/19/2020 05:19   DG CHEST PORT 1 VIEW  Result Date: 02/17/2020 CLINICAL DATA:  Intubation EXAM: PORTABLE CHEST 1 VIEW COMPARISON:  02/16/2020 FINDINGS: No significant interval change in AP portable chest radiograph, with diffuse bilateral interstitial heterogeneous airspace opacity, most conspicuous at the lung bases. Mild cardiomegaly. Support apparatus is unchanged including endotracheal tube, esophagogastric tube, and left neck vascular catheter. IMPRESSION: 1. No significant interval change in AP portable chest radiograph, with diffuse bilateral interstitial heterogeneous airspace opacity, most conspicuous at the lung bases. Findings are consistent with multifocal infection, edema, and/or ARDS. 2.  Unchanged support apparatus. Electronically Signed   By: Eddie Candle M.D.   On: 02/17/2020 11:26   DG CHEST PORT 1 VIEW  Result Date: 02/16/2020 CLINICAL DATA:  Altered mental status EXAM: PORTABLE CHEST 1 VIEW COMPARISON:  Yesterday FINDINGS: Endotracheal tube with tip halfway between the clavicular heads and carina. The feeding tube at least reaches the diaphragm. Left IJ line with tip at the brachiocephalic SVC confluence. Extensive bilateral pneumonia. Cardiomegaly. No visible air leak. IMPRESSION: Stable hardware positioning and extensive pneumonia. Electronically Signed   By: Monte Fantasia M.D.   On: 02/16/2020 07:08   DG CHEST PORT 1  VIEW  Result Date: 02/15/2020 CLINICAL DATA:  COVID positive.  Ventilator dependence. EXAM: PORTABLE CHEST 1 VIEW COMPARISON:  02/14/2020 FINDINGS: 0531 hours. Endotracheal tube tip is 2.7 cm above the base of the carina. A feeding tube passes into the stomach although the  distal tip position is not included on the film. Left IJ central line tip overlies the innominate vein confluence. Stable asymmetric elevation right hemidiaphragm. Relatively diffuse bilateral airspace disease again noted with mid and lower lung predominance. The cardio pericardial silhouette is enlarged. Telemetry leads overlie the chest. IMPRESSION: No substantial interval change in exam. Electronically Signed   By: Misty Stanley M.D.   On: 02/15/2020 09:02   DG CHEST PORT 1 VIEW  Result Date: 02/14/2020 CLINICAL DATA:  ETT EXAM: PORTABLE CHEST 1 VIEW COMPARISON:  Radiograph 02/13/2020 FINDINGS: *Endotracheal tube tip low in the trachea, 2 cm from the carina. Consider retraction 1-2 cm to the mid trachea. *Transesophageal tube tip terminates near the region of the gastric antrum/duodenal bulb. *Left upper IJ approach central venous catheter tip terminates near the left brachiocephalic-caval confluence. Persistent diffuse heterogeneous opacities throughout both lungs in a mid to lower lung predominance, not significantly changed from 1 day prior. Cardiomegaly is stable. Indistinct vascularity. No pneumothorax. Obscuration of the hemidiaphragms may suggest some layering effusion. No acute osseous or soft tissue abnormality. Telemetry leads overlie the chest. IMPRESSION: 1. Endotracheal tube tip low in the trachea, 2 cm from the carina. Consider retraction 1-2 cm to the mid trachea. 2. Stable diffuse heterogeneous opacities throughout both lungs in a mid to lower lung predominance. Could reflect a combination of edema and infection with likely layering bilateral effusions. These results will be called to the ordering clinician or  representative by the Radiologist Assistant, and communication documented in the PACS or [employer]. Electronically Signed   By: Lovena Le M.D.   On: 02/14/2020 05:39   DG CHEST PORT 1 VIEW  Result Date: 02/13/2020 CLINICAL DATA:  Hypoxia EXAM: PORTABLE CHEST 1 VIEW COMPARISON:  February 12, 2020 FINDINGS: Endotracheal tube tip is 1.7 cm above the carina. Enteric tube tip is below the diaphragm. Central catheter tip is in the left innominate vein near the junction with the superior vena cava. No pneumothorax. There is airspace opacity in both mid and lower lung regions with small pleural effusions bilaterally. Heart is enlarged with pulmonary vascularity normal, stable. No adenopathy. No bone lesions. IMPRESSION: Tube and catheter positions as described without pneumothorax. Persistent airspace opacity in the mid and lower lung regions with bilateral pleural effusions. Cardiomegaly. Overall appearance raises concern for a degree of congestive heart failure. There may well be superimposed pneumonia in the lung bases. Both edema and pneumonia may present concurrently. Electronically Signed   By: Lowella Grip III M.D.   On: 02/13/2020 08:03   DG CHEST PORT 1 VIEW  Result Date: 02/12/2020 CLINICAL DATA:  Endotracheal tube placement. EXAM: PORTABLE CHEST 1 VIEW COMPARISON:  02/11/2020. FINDINGS: Endotracheal tube and feeding tube in stable position. Left IJ line stable position. Cardiomegaly. Low lung volumes with bibasilar atelectasis. Diffuse bilateral pulmonary infiltrates/edema again noted. Small bilateral pleural effusions again noted. Chest is unchanged from prior exam. No pneumothorax. IMPRESSION: 1. Lines and tubes in stable position. 2. Cardiomegaly with persistent bilateral pulmonary infiltrates/edema and small bilateral pleural effusions. 3. Low lung volumes with bibasilar atelectasis. Chest is unchanged from prior exam. Electronically Signed   By: Marcello Moores  Register   On: 02/12/2020  05:21   DG CHEST PORT 1 VIEW  Result Date: 02/11/2020 CLINICAL DATA:  Hypoxia EXAM: PORTABLE CHEST 1 VIEW COMPARISON:  February 08, 2020 FINDINGS: Endotracheal tube tip is 3.0 cm above the carina. Enteric tube tip is below the diaphragm. Central catheter tip is in the superior vena cava slightly  beyond the junction with the left innominate vein. No pneumothorax. There is cardiomegaly with pulmonary venous hypertension. There is multifocal airspace opacity throughout the lungs bilaterally with equivocal pleural effusions bilaterally. No bone lesions. No adenopathy appreciable. IMPRESSION: Tube and catheter positions as described without pneumothorax. Cardiomegaly with pulmonary vascular congestion. Small pleural effusions. Multifocal airspace opacity may represent pulmonary edema or multifocal pneumonia. Both entities may be present concurrently. Appearance overall similar to recent study. Electronically Signed   By: Lowella Grip III M.D.   On: 02/11/2020 08:51   DG Chest Port 1 View  Result Date: 02/07/2020 CLINICAL DATA:  Acute respiratory failure. Pneumonia due to COVID-19. EXAM: PORTABLE CHEST 1 VIEW COMPARISON:  Radiograph yesterday. FINDINGS: Progressive heterogeneous bilateral airspace opacities, confluent in the lower lobes. Heart size grossly normal but obscured by adjacent airspace disease. Overall low lung volumes. No evidence of pneumomediastinum or pneumothorax. Soft tissue attenuation from habitus limits assessment. IMPRESSION: Bilateral COVID pneumonia which is progressed from yesterday. Low lung volumes persist. Electronically Signed   By: Keith Rake M.D.   On: 02/07/2020 15:36   DG Chest Port 1 View  Result Date: 02/06/2020 CLINICAL DATA:  Sepsis.  Encephalopathy. EXAM: PORTABLE CHEST 1 VIEW COMPARISON:  None. FINDINGS: Multifocal airspace opacity throughout both lungs. Small pleural effusions. No pneumothorax. IMPRESSION: Multifocal bilateral airspace opacities concerning  for multifocal infection. Electronically Signed   By: Ulyses Jarred M.D.   On: 02/06/2020 19:36   EEG adult  Result Date: 02/17/2020 Lora Havens, MD     02/17/2020  3:05 PM Patient Name: Belinda [name] MRN: [id number] Epilepsy Attending: Lora Havens Referring Physician/Provider: Dr Brand Males Date: 02/17/2020 Duration: 24.30 mins Patient history: 40yo F with ams in setting of sepsis and Covid. EEG to evaluate for seizure Level of alertness:  comatose AEDs during EEG study: Versed Technical aspects: This EEG study was done with scalp electrodes positioned according to the 10-20 International system of electrode placement. Electrical activity was acquired at a sampling rate of 500Hz  and reviewed with a high frequency filter of 70Hz  and a low frequency filter of 1Hz . EEG data were recorded continuously and digitally stored. Description: EEG showed continuous generalized 3 to 6 Hz theta-delta slowing admixed with an excessive amount of 15 to 18 Hz beta activity distributed symmetrically and diffusely.  Hyperventilation and photic stimulation were not performed.   ABNORMALITY -Excessive beta, generalized -Continuous slow, generalized IMPRESSION: This study is suggestive of severe diffuse encephalopathy, nonspecific etiology but likely related to sedation. No seizures or epileptiform discharges were seen throughout the recording. Lora Havens   ECHOCARDIOGRAM COMPLETE  Result Date: 02/12/2020    ECHOCARDIOGRAM REPORT   Patient Name:   Belinda [name] Date of Exam: 02/12/2020 Medical Rec #:  [id number]        Height:       63.0 in Accession #:    [id number]       Weight:       240.5 lb Date of Birth:  May 11, 1980        BSA:          2.091 m Patient Age:    59 years         BP:           99/70 mmHg Patient Gender: F                HR:           54 bpm. Exam Location:  Inpatient Procedure: 2D  Echo, Cardiac Doppler and Color Doppler Indications:    Acute respiratory distress R06.03  History:         Patient has no prior history of Echocardiogram examinations.  Sonographer:    Clayton Lefort RDCS (AE) Referring Phys: [id number]  Sonographer Comments: Image acquisition challenging due to patient body habitus. IMPRESSIONS  1. Left ventricular ejection fraction, by estimation, is 55 to 60%. The left ventricle has normal function. The left ventricle has no regional wall motion abnormalities. Left ventricular diastolic parameters were normal.  2. Right ventricular systolic function is normal. The right ventricular size is normal. The estimated right ventricular systolic pressure is 97.9 mmHg.  3. The mitral valve is grossly normal. Trivial mitral valve regurgitation.  4. The aortic valve is tricuspid. Aortic valve regurgitation is not visualized.  5. The inferior vena cava is normal in size with greater than 50% respiratory variability, suggesting right atrial pressure of 3 mmHg. Conclusion(s)/Recommendation(s): Normal biventricular function without evidence of hemodynamically significant valvular heart disease. FINDINGS  Left Ventricle: Left ventricular ejection fraction, by estimation, is 55 to 60%. The left ventricle has normal function. The left ventricle has no regional wall motion abnormalities. The left ventricular internal cavity size was normal in size. There is  no left ventricular hypertrophy. Left ventricular diastolic parameters were normal. Right Ventricle: The right ventricular size is normal. No increase in right ventricular wall thickness. Right ventricular systolic function is normal. The tricuspid regurgitant velocity is 2.26 m/s, and with an assumed right atrial pressure of 3 mmHg, the estimated right ventricular systolic pressure is 89.2 mmHg. Left Atrium: Left atrial size was normal in size. Right Atrium: Right atrial size was normal in size. Pericardium: There is no evidence of pericardial effusion. Mitral Valve: The mitral valve is grossly normal. Trivial mitral valve regurgitation.  Tricuspid Valve: The tricuspid valve is grossly normal. Tricuspid valve regurgitation is trivial. Aortic Valve: The aortic valve is tricuspid. Aortic valve regurgitation is not visualized. Aortic valve mean gradient measures 3.0 mmHg. Aortic valve peak gradient measures 5.8 mmHg. Aortic valve area, by VTI measures 2.60 cm. Pulmonic Valve: The pulmonic valve was grossly normal. Pulmonic valve regurgitation is trivial. Aorta: The aortic root and ascending aorta are structurally normal, with no evidence of dilitation. Venous: The inferior vena cava is normal in size with greater than 50% respiratory variability, suggesting right atrial pressure of 3 mmHg. IAS/Shunts: No atrial level shunt detected by color flow Doppler.  LEFT VENTRICLE PLAX 2D LVIDd:         5.20 cm  Diastology LVIDs:         3.80 cm  LV e' medial:    9.90 cm/s LV PW:         1.30 cm  LV E/e' medial:  7.5 LV IVS:        1.00 cm  LV e' lateral:   10.10 cm/s LVOT diam:     2.20 cm  LV E/e' lateral: 7.4 LV SV:         67 LV SV Index:   32 LVOT Area:     3.80 cm  RIGHT VENTRICLE             IVC RV Basal diam:  2.60 cm     IVC diam: 1.10 cm RV S prime:     11.10 cm/s TAPSE (M-mode): 1.9 cm LEFT ATRIUM             Index       RIGHT ATRIUM  Index LA diam:        2.90 cm 1.39 cm/m  RA Area:     14.60 cm LA Vol (A2C):   49.8 ml 23.81 ml/m RA Volume:   35.90 ml  17.17 ml/m LA Vol (A4C):   37.8 ml 18.07 ml/m LA Biplane Vol: 46.4 ml 22.19 ml/m  AORTIC VALVE AV Area (Vmax):    2.65 cm AV Area (Vmean):   2.80 cm AV Area (VTI):     2.60 cm AV Vmax:           120.00 cm/s AV Vmean:          76.100 cm/s AV VTI:            0.259 m AV Peak Grad:      5.8 mmHg AV Mean Grad:      3.0 mmHg LVOT Vmax:         83.60 cm/s LVOT Vmean:        56.100 cm/s LVOT VTI:          0.177 m LVOT/AV VTI ratio: 0.68  AORTA Ao Root diam: 3.30 cm Ao Asc diam:  2.60 cm MITRAL VALVE               TRICUSPID VALVE MV Area (PHT): 3.21 cm    TR Peak grad:   20.4 mmHg MV Decel  Time: 236 msec    TR Vmax:        226.00 cm/s MV E velocity: 74.60 cm/s MV A velocity: 40.70 cm/s  SHUNTS MV E/A ratio:  1.83        Systemic VTI:  0.18 m                            Systemic Diam: 2.20 cm Lyman Bishop MD Electronically signed by Lyman Bishop MD Signature Date/Time: 02/12/2020/6:17:29 PM    Final    CT HEAD CODE STROKE WO CONTRAST`  Result Date: 02/25/2020 CLINICAL DATA:  Code stroke.  Left-sided weakness EXAM: CT HEAD WITHOUT CONTRAST TECHNIQUE: Contiguous axial images were obtained from the base of the skull through the vertex without intravenous contrast. COMPARISON:  None. FINDINGS: Brain: There is no acute intracranial hemorrhage, mass effect, or edema. Gray-white differentiation is preserved. No extra-axial collection. Ventricles and sulci are normal in size and configuration. Vascular: No hyperdense vessel. Skull: Unremarkable. Sinuses/Orbits: Nonspecific paranasal sinus opacification primarily involving right maxillary and both sphenoid sinuses. There is thinning or dehiscence of the inferior right maxillary sinus wall. Orbits are unremarkable. Other: Minimal mastoid opacification. ASPECTS ([place] Stroke Program Early CT Score) - Ganglionic level infarction (caudate, lentiform nuclei, internal capsule, insula, M1-M3 cortex): 7 - Supraganglionic infarction (M4-M6 cortex): 3 Total score (0-10 with 10 being normal): 10 IMPRESSION: There is no acute intracranial hemorrhage or evidence of acute infarction. ASPECT score is 10. These results were communicated to Dr. Lorrin Goodell at 12:15 pm on 02/25/2020 by text page via the [facility] messaging system. Electronically Signed   By: Macy Mis M.D.   On: 02/25/2020 12:36   VAS [place] LOWER EXTREMITY VENOUS (DVT)  Result Date: 02/18/2020  Lower Venous DVT Study Indications: Covid-19, elevated D-Dimer.  Comparison Study: No prior study Performing Technologist: Sharion Dove RVS  Examination Guidelines: A complete evaluation includes B-mode imaging,  spectral Doppler, color Doppler, and power Doppler as needed of all accessible portions of each vessel. Bilateral testing is considered an integral part of a complete examination. Limited examinations for reoccurring indications  may be performed as noted. The reflux portion of the exam is performed with the patient in reverse Trendelenburg.  +---------+---------------+---------+-----------+----------+--------------+  RIGHT     Compressibility Phasicity Spontaneity Properties Thrombus Aging  +---------+---------------+---------+-----------+----------+--------------+  CFV       Full            Yes       Yes                                    +---------+---------------+---------+-----------+----------+--------------+  SFJ       Full                                                             +---------+---------------+---------+-----------+----------+--------------+  FV Prox   Full                                                             +---------+---------------+---------+-----------+----------+--------------+  FV Mid    Full                                                             +---------+---------------+---------+-----------+----------+--------------+  FV Distal Full                                                             +---------+---------------+---------+-----------+----------+--------------+  PFV       Full                                                             +---------+---------------+---------+-----------+----------+--------------+  POP       Full            Yes       Yes                                    +---------+---------------+---------+-----------+----------+--------------+  PTV       Full                                                             +---------+---------------+---------+-----------+----------+--------------+  PERO      Full                                                             +---------+---------------+---------+-----------+----------+--------------+    +---------+---------------+---------+-----------+----------+--------------+  LEFT      Compressibility Phasicity Spontaneity Properties Thrombus Aging  +---------+---------------+---------+-----------+----------+--------------+  CFV       Full            Yes       Yes                                    +---------+---------------+---------+-----------+----------+--------------+  SFJ       Full                                                             +---------+---------------+---------+-----------+----------+--------------+  FV Prox   Full                                                             +---------+---------------+---------+-----------+----------+--------------+  FV Mid    Full                                                             +---------+---------------+---------+-----------+----------+--------------+  FV Distal Full                                                             +---------+---------------+---------+-----------+----------+--------------+  PFV       Full                                                             +---------+---------------+---------+-----------+----------+--------------+  POP       Full            Yes       Yes                                    +---------+---------------+---------+-----------+----------+--------------+  PTV       Full                                                             +---------+---------------+---------+-----------+----------+--------------+  PERO      Full                                                             +---------+---------------+---------+-----------+----------+--------------+  Summary: BILATERAL: - No evidence of deep vein thrombosis seen in the lower extremities, bilaterally. -No evidence of popliteal cyst, bilaterally.   *See table(s) above for measurements and observations. Electronically signed by Ruta Hinds MD on 02/18/2020 at 5:24:02 PM.    Final

## 2020-02-26 NOTE — Progress Notes (Signed)
Inpatient Rehab Admissions Coordinator Note:   Per therapy recommendations, pt was screened for CIR candidacy by Shann Medal, PT, DPT.  At this time we are recommending a CIR consult and I will place an order per our protocol.  Please contact me with questions.   Shann Medal, PT, DPT [phone] 02/26/20 11:49 AM

## 2020-02-26 NOTE — Progress Notes (Signed)
  Speech Language Pathology Treatment: Nada Boozer Speaking valve  Patient Details Name: Belinda [name] MRN: [id number] DOB: 02/22/1981 Today's Date: 02/26/2020 Time: 1350-1420 SLP Time Calculation (min) (ACUTE ONLY): 30 min  Assessment / Plan / Recommendation Clinical Impression  Pt seen up in chair, eager to participate. RN reports pt has had moderate secretions but mobilizes them well. After cuff deflation pt had immediate hard cough with tracheal expectoration and then oral expectoration after PSMV placed. Pt wore PMSV for 20-30 minutes with only a few instances of removal. Vitals WNL and stable. Pt thrilled to hear her own voice, though very breathy with only soft phonation. Pt would benefit from cuffless trach as soon as possible for more effective passage of air to upper airway for phonation. Again discussed trach and typical progression. Pt asked appropriate questions. Mentation slowly improving. Pt can wear PMSV with full supervision from staff/therapies. Will f/u for progress.   HPI HPI: Pt is a 40 year old female with medical history significant for obesity.  She presented to [facility] ED on 12/15 with 24 to 48 hours of AMS following exposure to her COVID+ son. CXR showed diffuse bilateral airspace disease, she was confused and agitated. Physical restraints applied on admission and Precedex infusion given.  She required 100% nonrebreather mask and was transferred to [facility] for further intervention. CXR 12/29: Improved bilateral ventilation with residual confluent bibasilar opacity. CT head negative. ETT 12/17-trach 12/28. Stat CT head completed on 1/3 due to increased weakness noted by PT and was negative.      SLP Plan  Continue with current plan of care       Recommendations         Patient may use Passy-Muir Speech Valve: During all therapies with supervision;Intermittently with supervision PMSV Supervision: Full MD: Please consider changing trach tube to : Smaller  size;Cuffless         Oral Care Recommendations: Oral care BID Follow up Recommendations: Inpatient Rehab SLP Visit Diagnosis: Aphonia (R49.1) Plan: Continue with current plan of care       GO                Tempestt Silba, Katherene Ponto 02/26/2020, 4:25 PM

## 2020-02-26 NOTE — Progress Notes (Signed)
Nutrition Follow-up  DOCUMENTATION CODES:   Morbid obesity  INTERVENTION:   Tube Feeding via Cortrak: Vital 1.5 to 65 ml/hr Pro-Source TF 45 mL TID Provides 138 g of protein, 2460 kcals and 1185 mL of free water Meets 100% calorie and protein needs  TF plus free water flush 200 mL q 6 hours provides 1985 mL of free water per day  NUTRITION DIAGNOSIS:   Inadequate oral intake related to acute illness as evidenced by NPO status.  Being addressed via TF   GOAL:   Patient will meet greater than or equal to 90% of their needs  Progressing  MONITOR:   Vent status,TF tolerance,Weight trends,Labs  REASON FOR ASSESSMENT:   Consult,Ventilator Enteral/tube feeding initiation and management  ASSESSMENT:   40 yo female admitted with severe COVID-19 pneumonia with ARDS requiring intubation. No PMH other than childhood asthma, anxiety   12/15 [facility] ER with ARDS, COVID+ 12/16 Transferred to [facility] 12/17 Cortrak placed, Intubated 12/26 New info obtained: pt on suboxone with hx of opioid OD in 2018 12/28 Trach placed, off paralytics  Pt remains on vent support via trach  Vital 1.5 at 65 ml/hr with Pro-Source TF 45 mL TID via Cortrak tube  Free water flushes 200 mL q 6 hours due to hypernatremia  No skin breakdown noted per RN skin assessment  Diarrhea has improved, rectal tube removed. Pt on prn bowel regimen  Labs: sodium 146 (H), Creatinine wdl Meds: lasix, ss novolog, novolog q 4 hours, levemir, KCL   Diet Order:   Diet Order            Diet NPO time specified  Diet effective midnight                 EDUCATION NEEDS:   Not appropriate for education at this time  Skin:  Skin Assessment: Reviewed RN Assessment  Last BM:  1/2 (flexiseal removed)  Height:   Ht Readings from Last 1 Encounters:  02/06/20 5' 3"  (1.6 m)    Weight:   Wt Readings from Last 1 Encounters:  02/26/20 93.1 kg    BMI:  Body mass index is 36.36 kg/m.  Estimated  Nutritional Needs:   Kcal:  0929-5747 kcals  Protein:  115-130 g  Fluid:  >/= 2 L   Kerman Passey MS, RDN, LDN, CNSC Registered Dietitian III Clinical Nutrition RD Pager and On-Call Pager Number Located in [place]

## 2020-02-27 ENCOUNTER — Inpatient Hospital Stay (HOSPITAL_COMMUNITY): Payer: Medicaid Other

## 2020-02-27 DIAGNOSIS — G9341 Metabolic encephalopathy: Secondary | ICD-10-CM | POA: Diagnosis not present

## 2020-02-27 DIAGNOSIS — R748 Abnormal levels of other serum enzymes: Secondary | ICD-10-CM | POA: Diagnosis not present

## 2020-02-27 DIAGNOSIS — D649 Anemia, unspecified: Secondary | ICD-10-CM

## 2020-02-27 DIAGNOSIS — J9601 Acute respiratory failure with hypoxia: Secondary | ICD-10-CM | POA: Diagnosis not present

## 2020-02-27 DIAGNOSIS — E237 Disorder of pituitary gland, unspecified: Secondary | ICD-10-CM

## 2020-02-27 DIAGNOSIS — U071 COVID-19: Secondary | ICD-10-CM | POA: Diagnosis not present

## 2020-02-27 LAB — GLUCOSE, CAPILLARY
Glucose-Capillary: 118 mg/dL — ABNORMAL HIGH (ref 70–99)
Glucose-Capillary: 121 mg/dL — ABNORMAL HIGH (ref 70–99)
Glucose-Capillary: 129 mg/dL — ABNORMAL HIGH (ref 70–99)
Glucose-Capillary: 136 mg/dL — ABNORMAL HIGH (ref 70–99)
Glucose-Capillary: 143 mg/dL — ABNORMAL HIGH (ref 70–99)
Glucose-Capillary: 153 mg/dL — ABNORMAL HIGH (ref 70–99)
Glucose-Capillary: 73 mg/dL (ref 70–99)
Glucose-Capillary: 90 mg/dL (ref 70–99)

## 2020-02-27 LAB — CBC
HCT: 36.5 % (ref 36.0–46.0)
Hemoglobin: 11.2 g/dL — ABNORMAL LOW (ref 12.0–15.0)
MCH: 29.9 pg (ref 26.0–34.0)
MCHC: 30.7 g/dL (ref 30.0–36.0)
MCV: 97.6 fL (ref 80.0–100.0)
Platelets: 251 10*3/uL (ref 150–400)
RBC: 3.74 MIL/uL — ABNORMAL LOW (ref 3.87–5.11)
RDW: 13.7 % (ref 11.5–15.5)
WBC: 7.9 10*3/uL (ref 4.0–10.5)
nRBC: 0 % (ref 0.0–0.2)

## 2020-02-27 LAB — COMPREHENSIVE METABOLIC PANEL
ALT: 145 U/L — ABNORMAL HIGH (ref 0–44)
AST: 83 U/L — ABNORMAL HIGH (ref 15–41)
Albumin: 3.3 g/dL — ABNORMAL LOW (ref 3.5–5.0)
Alkaline Phosphatase: 104 U/L (ref 38–126)
Anion gap: 13 (ref 5–15)
BUN: 48 mg/dL — ABNORMAL HIGH (ref 6–20)
CO2: 29 mmol/L (ref 22–32)
Calcium: 9.6 mg/dL (ref 8.9–10.3)
Chloride: 103 mmol/L (ref 98–111)
Creatinine, Ser: 0.73 mg/dL (ref 0.44–1.00)
GFR, Estimated: >60 mL/min (ref >60)
Glucose, Bld: 97 mg/dL (ref 70–99)
Potassium: 4 mmol/L (ref 3.5–5.1)
Sodium: 145 mmol/L (ref 135–145)
Total Bilirubin: 0.8 mg/dL (ref 0.3–1.2)
Total Protein: 8 g/dL (ref 6.5–8.1)

## 2020-02-27 MED ORDER — RESOURCE THICKENUP CLEAR PO POWD
ORAL | Status: DC | PRN
  Filled 2020-02-27 (×2): qty 125

## 2020-02-27 NOTE — Progress Notes (Signed)
Inpatient Rehabilitation-Admissions Coordinator   CIR consult received. Pt remains on isolation precautions until 02/28/20. AC will follow up for rehab assessment once off precautions.   Raechel Ache, OTR/L  Rehab Admissions Coordinator  [phone] 02/27/2020 2:52 PM

## 2020-02-27 NOTE — Progress Notes (Addendum)
Modified Barium Swallow Progress Note  Patient Details  Name: Belinda [name] MRN: [id number] Date of Birth: Dec 15, 1980  Today's Date: 02/27/2020  Modified Barium Swallow completed.  Full report located under Chart Review in the Imaging Section.  Brief recommendations include the following:  Clinical Impression  Pt demonstrates a moderate oropharyngeal dysphagia likely secondry to prolonged intubation with decreased glottic closure. Pt has adequate oral phase (though no upper dentition) and timely swallow, though there is silent aspiration during the swallow with thin and nectar thick liquids regardless of bolus size and despite a chin tuck. Pt was not wearing her PMSV during this exam (pt COVID positive and PMSV left in room) and Cortrak was also in place. Suspect that pt may be able to quickly upgrade to nectar when cortrak comes out and pt has PMSV in place to aid in cough/throat clear response. For now pt able to initaite a dys 2/honey thick diet. WIll f/u for tolerance.   Swallow Evaluation Recommendations       SLP Diet Recommendations: Dysphagia 2 (Fine chop) solids;Honey thick liquids   Liquid Administration via: Cup;Straw   Medication Administration: Whole meds with puree   Supervision: Staff to assist with self feeding   Compensations: Slow rate;Small sips/bites   Postural Changes: Seated upright at 90 degrees       Other Recommendations: Place PMSV during PO intake;Have oral suction available   Herbie [place], MA [place] Pager [phone] Office [phone]  Lynann Beaver 02/27/2020,2:46 PM

## 2020-02-27 NOTE — Progress Notes (Addendum)
PROGRESS NOTE                                                                             PROGRESS NOTE                                                                                                                                                                                                             Patient Demographics:    Belinda [name], is a 40 y.o. female, DOB - 12/03/1980, XLK:[id number]  Outpatient Primary MD for the patient is Kathyrn Drown, MD    LOS - 21  Admit date - 02/06/2020    Chief Complaint  Patient presents with  . Altered Mental Status       Brief Narrative    40 year old white female, only medical history is obesity.  Presented to [facility] ER 12/15 w/ ARDS and acute metabolic encephalopathy 2/2 COVID   12/15 presented to the emergency room, hypoxic, encephalopathic, Covid positive.CT brain negative for acute injury with only some mild para sinus mucosal thickening with some periodontal disease.  Started on supplemental oxygen, IV Solu-Medrol, IV remdesivir, and baricitinib.  Also started empirically on cefepime  and vancomycin to cover for potential bacterial pneumonia 12/16 multiple reports of worsening confusion, intermittent combativeness, attempts to bite staff.  Not able to take oral medications.  Started on Precedex.  Transferred to [facility]. Changed abx to unasyn, added low dose ativan as pt chronically on xanax.  12/17: Patient more hypoxic, requiring more sedation. Decision made to proceed with intubation.  Intubated, left IJ catheter placed.  PF ratio only 88.  Core track ordered.  Neuromuscular blockade initiated.  Prone protocol initiated.  12/19 - No overnight events but this morning hypertensive and tachycardic. EKG personally reviewed, sinus tachycardia with normal axis. BIS values are low in the 30s. She is still on continuous paralytics.   12/20 -neuromuscular blockade discontinued yesterday.  At  this point in time patient is extremely agitated on the ventilator despite Dilaudid infusion Versed infusion, oxycodone schedule, clonopoin  scheduloed,  and phenobarb once daily at night.  She is on prednisone and Barcitinib. On vent 50%. AFebrikle  12/21  - 12/21 - cxr horrible. 40% fio2, pulse ox 90%,.  On dilaudid gtt, oxycdone po, precedex gtt, versed gt and klonopin,  On phenobarb QHS x 2 night.  On haldol schedulesd x 1 dayt.   -. RASS -4 but easily goes to +2 per RN. On TF.  K 2.9 while on 43mkcl daily. + 4L volume overloa  12/22 - 40% fio2. On dilaudid gtt, [precedex gtt, versed gtt. On oxy and klonopin. Still very agitated On TF. -2L since admit  12/23 -> stil with significant intermittent agitation. On 30% fio2, Now on diprivan gtt, precedex gtt, fent gtt, versed gtt, levophed gtt. On vent, 30% fio2  12/24   - ? Low grade fever. 30% fio2 on vent. On fent gtt, prededex gtt, ketamine gtt, versed gtt . Also on levophed gtt. Some breakthrough agitation per RN but much better overall. Off diprivan gtt  12/26 -  On fent gtt, precedex gtt, ketamnie gtt, versed gtt. On TF. On free water.  Low grade fever + . On vent  - 30  12/28 Tracheostomy-paralytics off    Subjective:   Patient is noted to be lying comfortably on the bed.  Does not appear to be in any discomfort.  Unable to communicate well with the patient due to her tracheostomy   Assessment  & Plan :   Acute hypoxic respiratory failure/ARDS/status post tracheostomy  Patient has history of asthma at baseline.  Was admitted to the intensive care unit initially.  Was placed on the ventilator.  Was on paralytics.  Subsequently underwent tracheostomy on 12/28.  Requiring ventilator at nighttime.  On trach collar in the day 8 hours.  Pulmonology continues to follow. Patient was also treated with Ancef for presumed bacterial pneumonia.  Pneumonia due to COVID-19   - s/p remedsivir  - sp steroids ended 12/24 -  Baricitinib  completed 12/30 -May discontinue Covid isolation 02/28/2020  Klebsiella urinary tract infection with sepsis syndrome with or without aspiration pneumonia  - unasyn ended 02/11/20 - Staph NOS VAP on 12/26 - Resp Cx MSSA on respiratory culture, complete cefazolin 02/23/18/2022 - No additional antibiotics at this time -She is afebrile, no leukocytosis  Acute metabolic encephalopathy Chronic xanax and on suboxone and has hx of opioid OD in 2018.  Encephalopathy is in the setting of sepsis and Covid infection.  Started on Risperdal with improvement.  Patient also noted to be on clonazepam.  History of opioid dependence It looks like patient was started on scheduled methadone on 12/25 by PCCM.  Home medication list reviewed.  Only alprazolam as noted.  Unclear if the patient was on Suboxone prior to admission.  We will need to wean her off of the methadone eventually.   Noted to be on scheduled hydromorphone as well.  Volume overload ECHO normal 02/12/20.    Patient on daily furosemide.  Monitor ins and outs daily weights    Hypophosphatemia Replete as needed  Anemia of critical illness Hemoglobin stable.  No evidence of overt bleeding.  Mild transaminitis Likely due to acute illness and COVID-19.  Continue to trend.  Check hepatitis panel was not checked recently  Nutrition Currently on tube feedings.  Speech therapy is following.  Prediabetes with Hyperglycemia HbA1c 5.8.  Continue with SSI and Levemir as long as she is on tube feedings.  Hypernatrmia Continue with free water.  Noted to be better  this morning.  Concern for pituitary lesion This was incidentally noted on MRI brain.  Will need a dedicated pituitary protocol MRI when patient is more stable.  Will check serum prolactin, insulin like growth factor, plasma ACTH level also check TSH free T4.   DVT prophylaxis: Lovenox CODE STATUS: Full code Family communication: we will update father Disposition: Will likely  need to go to rehabilitation when medically stable.  Status is: Inpatient  Remains inpatient appropriate because:IV treatments appropriate due to intensity of illness or inability to take PO and Inpatient level of care appropriate due to severity of illness   Dispo: The patient is from: Home              Anticipated d/c is to: [facility]              Anticipated d/c date is: > 3 days              Patient currently is not medically stable to d/c.      Consults  :  PCCM, Neurology  Procedures  :  Trach       Inpatient Medications  Scheduled Meds: . vitamin C  500 mg Per Tube Daily  . chlorhexidine gluconate (MEDLINE KIT)  15 mL Mouth Rinse BID  . Chlorhexidine Gluconate Cloth  6 each Topical Daily  . clonazePAM  2.5 mg Per Tube BID  . enoxaparin (LOVENOX) injection  0.5 mg/kg Subcutaneous Q24H  . feeding supplement (PROSource TF)  45 mL Per Tube TID  . free water  200 mL Per Tube Q6H  . furosemide  20 mg Intravenous Daily  . HYDROmorphone  4 mg Per Tube Q6H  . insulin aspart  0-20 Units Subcutaneous Q4H  . insulin aspart  5 Units Subcutaneous Q4H  . insulin detemir  10 Units Subcutaneous BID  . mouth rinse  15 mL Mouth Rinse 10 times per day  . melatonin  3 mg Per Tube QHS  . methadone  20 mg Per Tube Q12H  . midazolam  5 mg Intravenous Once  . multivitamin with minerals  1 tablet Per Tube Daily  . pantoprazole sodium  40 mg Per Tube Daily  . potassium chloride  20 mEq Per Tube Daily  . risperiDONE  3 mg Per Tube QHS  . sodium chloride flush  10-40 mL Intracatheter Q12H  . thiamine injection  100 mg Intravenous Daily  . zinc sulfate  220 mg Per Tube Daily   Continuous Infusions: . sodium chloride Stopped (02/25/20 0849)  . feeding supplement (VITAL 1.5 CAL) 1,000 mL (02/26/20 2347)  . lactated ringers 10 mL/hr at 02/08/20 1118   PRN Meds:.sodium chloride, acetaminophen, docusate, fentaNYL (SUBLIMAZE) injection, ipratropium-albuterol, LORazepam, polyethylene glycol,  sodium chloride flush  Antibiotics  :    Anti-infectives (From admission, onward)   Start     Dose/Rate Route Frequency Ordered Stop   02/18/20 1200  ceFAZolin (ANCEF) IVPB 2g/100 mL premix        2 g 200 mL/hr over 30 Minutes Intravenous Every 8 hours 02/18/20 1104 02/23/20 2044   02/17/20 2000  vancomycin (VANCOCIN) IVPB 1000 mg/200 mL premix  Status:  Discontinued        1,000 mg 200 mL/hr over 60 Minutes Intravenous Every 8 hours 02/17/20 1146 02/18/20 1104   02/17/20 1345  vancomycin (VANCOREADY) IVPB 2000 mg/400 mL        2,000 mg 200 mL/hr over 120 Minutes Intravenous  Once 02/17/20 1257 02/17/20 1752   02/17/20 1245  vancomycin (VANCOCIN) 2,000 mg in sodium chloride 0.9 % 500 mL IVPB  Status:  Discontinued        2,000 mg 250 mL/hr over 120 Minutes Intravenous  Once 02/17/20 1146 02/17/20 1445   02/07/20 1600  Ampicillin-Sulbactam (UNASYN) 3 g in sodium chloride 0.9 % 100 mL IVPB        3 g 200 mL/hr over 30 Minutes Intravenous Every 6 hours 02/07/20 1513 02/11/20 2145   02/07/20 1000  remdesivir 100 mg in sodium chloride 0.9 % 100 mL IVPB        100 mg 200 mL/hr over 30 Minutes Intravenous Daily 02/06/20 2132 02/10/20 1103   02/07/20 0800  vancomycin (VANCOCIN) IVPB 750 mg/150 ml premix  Status:  Discontinued        750 mg 150 mL/hr over 60 Minutes Intravenous Every 12 hours 02/06/20 2023 02/07/20 1510   02/07/20 0400  ceFEPIme (MAXIPIME) 2 g in sodium chloride 0.9 % 100 mL IVPB  Status:  Discontinued        2 g 200 mL/hr over 30 Minutes Intravenous Every 8 hours 02/06/20 2023 02/07/20 1510   02/06/20 2145  remdesivir 100 mg in sodium chloride 0.9 % 100 mL IVPB        100 mg 200 mL/hr over 30 Minutes Intravenous Every 30 min 02/06/20 2132 02/06/20 2305   02/06/20 1915  vancomycin (VANCOREADY) IVPB 2000 mg/400 mL        2,000 mg 200 mL/hr over 120 Minutes Intravenous  Once 02/06/20 1907 02/06/20 2250   02/06/20 1845  ceFEPIme (MAXIPIME) 2 g in sodium chloride 0.9 % 100 mL  IVPB        2 g 200 mL/hr over 30 Minutes Intravenous  Once 02/06/20 1834 02/06/20 2056   02/06/20 1845  metroNIDAZOLE (FLAGYL) IVPB 500 mg        500 mg 100 mL/hr over 60 Minutes Intravenous  Once 02/06/20 1834 02/06/20 2204   02/06/20 1845  vancomycin (VANCOCIN) IVPB 1000 mg/200 mL premix  Status:  Discontinued        1,000 mg 200 mL/hr over 60 Minutes Intravenous  Once 02/06/20 1834 02/06/20 1907       Bonnielee Haff M.D on 02/27/2020 at 12:09 PM  To page go to [email or web address]   [facility] -  Office  [phone]     Objective:   Vitals:   02/27/20 0802 02/27/20 0900 02/27/20 1000 02/27/20 1116  BP:  121/78 103/84   Pulse:  (!) 125 (!) 113   Resp: (!) 24 (!) 29 16   Temp:    98.6 F (37 C)  TempSrc:    Oral  SpO2:  96% 96%   Weight:      Height:        Wt Readings from Last 3 Encounters:  02/26/20 93.1 kg     Intake/Output Summary (Last 24 hours) at 02/27/2020 1209 Last data filed at 02/27/2020 1000 Gross per 24 hour  Intake 1715 ml  Output 775 ml  Net 940 ml     Physical Exam  General appearance: Awake alert.  In no distress.  Noted to be distracted Tracheostomy noted Resp: Mildly tachypneic.  Coarse breath sounds with crackles at the bases.  No wheezing or rhonchi. Cardio: S1-S2 is tachycardic regular.  No S3-S4.  No rubs murmurs or bruit GI: Abdomen is soft.  Nontender nondistended.  Bowel sounds are present normal.  No masses organomegaly Extremities: No edema.  Physical deconditioning noted. Neurologic:  No focal  neurological deficits.       Data Review:    CBC Recent Labs  Lab 02/21/20 0820 02/22/20 0803 02/23/20 0603 02/26/20 0400 02/27/20 0350  WBC 10.5  --  9.8 8.6 7.9  HGB 9.4* 9.9* 10.2* 11.0* 11.2*  HCT 28.6* 29.0* 31.0* 35.7* 36.5  PLT 282  --  295 275 251  MCV 94.7  --  96.0 98.1 97.6  MCH 31.1  --  31.6 30.2 29.9  MCHC 32.9  --  32.9 30.8 30.7  RDW 13.6  --  13.8 13.8 13.7  LYMPHSABS  --   --  1.9  --   --   MONOABS   --   --  0.8  --   --   EOSABS  --   --  0.1  --   --   BASOSABS  --   --  0.0  --   --     Recent Labs  Lab 02/21/20 0820 02/22/20 0803 02/23/20 0603 02/26/20 0400 02/27/20 0350  NA 137 139 143 146* 145  K 4.1 4.6 4.3 4.1 4.0  CL 96*  --  98 104 103  CO2 29  --  32 29 29  GLUCOSE 135*  --  150* 189* 97  BUN 16  --  23* 49* 48*  CREATININE 0.57  --  0.62 0.81 0.73  CALCIUM 8.7*  --  9.4 9.5 9.6  AST  --   --   --   --  83*  ALT  --   --   --   --  145*  ALKPHOS  --   --   --   --  104  BILITOT  --   --   --   --  0.8  ALBUMIN  --   --   --   --  3.3*  MG 2.3  --  2.6* 2.7*  --       Lab Results  Component Value Date   HGBA1C 5.8 (H) 02/08/2020    Micro Results Recent Results (from the past 240 hour(s))  Culture, respiratory (non-expectorated)     Status: None   Collection Time: 02/20/20 10:58 AM   Specimen: Tracheal Aspirate; Respiratory  Result Value Ref Range Status   Specimen Description TRACHEAL ASPIRATE  Final   Special Requests NONE  Final   Gram Stain   Final    NO WBC SEEN RARE GRAM POSITIVE COCCI IN PAIRS Performed at [facility], [address], [place], [place] [address]    Culture FEW STAPHYLOCOCCUS AUREUS  Final   Report Status 02/22/2020 FINAL  Final   Organism ID, Bacteria STAPHYLOCOCCUS AUREUS  Final      Susceptibility   Staphylococcus aureus - MIC*    CIPROFLOXACIN <=0.5 SENSITIVE Sensitive     ERYTHROMYCIN <=0.25 SENSITIVE Sensitive     GENTAMICIN <=0.5 SENSITIVE Sensitive     OXACILLIN 0.5 SENSITIVE Sensitive     TETRACYCLINE <=1 SENSITIVE Sensitive     VANCOMYCIN <=0.5 SENSITIVE Sensitive     TRIMETH/SULFA <=10 SENSITIVE Sensitive     CLINDAMYCIN <=0.25 SENSITIVE Sensitive     RIFAMPIN <=0.5 SENSITIVE Sensitive     Inducible Clindamycin NEGATIVE Sensitive     * FEW STAPHYLOCOCCUS AUREUS    Radiology Reports DG Chest 1 View  Result Date: 02/08/2020 CLINICAL DATA:  OG tube placement. EXAM: CHEST  1 VIEW COMPARISON:  Chest  x-ray 02/07/2020. FINDINGS: 539 hours. Bilateral airspace disease noted, left greater than right. Endotracheal  tube tip is approximately 4.3 cm above the base of the carina. Left IJ central line tip overlies the innominate vein confluence. NG tube tip is positioned in the stomach with the proximal side port below the GE junction. Telemetry leads overlie the chest. IMPRESSION: NG tube tip is in the stomach with proximal side port of the tube below the GE junction. Electronically Signed   By: Misty Stanley M.D.   On: 02/08/2020 05:54   CT HEAD WO CONTRAST  Result Date: 02/18/2020 CLINICAL DATA:  Delirium. Altered mental status. EXAM: CT HEAD WITHOUT CONTRAST TECHNIQUE: Contiguous axial images were obtained from the base of the skull through the vertex without intravenous contrast. COMPARISON:  CT head without contrast 09/01/2017 at [facility]. FINDINGS: Brain: No acute infarct, hemorrhage, or mass lesion is present. No significant white matter lesions are present. The ventricles are of normal size. No significant extraaxial fluid collection is present. The brainstem and cerebellum are within normal limits. Vascular: No hyperdense vessel or unexpected calcification. Skull: Calvarium is intact. No focal lytic or blastic lesions are present. No significant extracranial soft tissue lesion is present. Sinuses/Orbits: Patient is intubated. Fluid level is present in the right maxillary sinus. Fluid is present in the nasopharynx. Fluid is present in the sphenoid sinuses and posterior ethmoid air cells bilaterally. Left nasogastric tube is noted. Minimal mastoid fluid is present inferiorly on both sides. IMPRESSION: 1. Normal CT appearance of the brain. 2. Fluid in the paranasal sinuses and nasopharynx likely related to intubation. 3. Minimal mastoid fluid inferiorly on both sides. Electronically Signed   By: San Morelle M.D.   On: 02/18/2020 02:16   CT Head Wo Contrast  Result Date:  02/06/2020 CLINICAL DATA:  Altered mental status.  Recent tooth extraction. EXAM: CT HEAD WITHOUT CONTRAST TECHNIQUE: Contiguous axial images were obtained from the base of the skull through the vertex without intravenous contrast. COMPARISON:  Head CT 09/01/2017 FINDINGS: Brain: No intracranial hemorrhage, mass effect, or midline shift. No hydrocephalus. The basilar cisterns are patent. No evidence of territorial infarct or acute ischemia. No extra-axial or intracranial fluid collection. Vascular: No hyperdense vessel or unexpected calcification. Skull: No fracture or focal lesion. Sinuses/Orbits: Mucosal thickening of the right greater than left maxillary sinus. Scattered mucosal thickening throughout ethmoid air cells. Patient appears edentulous of upper teeth, presumed prior periapical lucency in the right upper molars, contiguous with the right maxillary sinus. No acute orbital abnormality. Mastoid air cells are clear. Other: None. IMPRESSION: 1. No acute intracranial abnormality. 2. Paranasal sinus disease. 3. Patient appears edentulous of upper teeth, presumed prior periapical lucency in the right upper molars, contiguous with the right maxillary sinus. CLINICAL DATA:  Altered mental status. Recent tooth extraction. EXAM: CT HEAD WITHOUT CONTRAST TECHNIQUE: Contiguous axial images were obtained from the base of the skull through the vertex without intravenous contrast. COMPARISON:  Head CT 09/01/2017 FINDINGS: Brain: No intracranial hemorrhage, mass effect, or midline shift. No hydrocephalus. The basilar cisterns are patent. No evidence of territorial infarct or acute ischemia. No extra-axial or intracranial fluid collection. Vascular: No hyperdense vessel or unexpected calcification. Skull: No fracture or focal lesion. Sinuses/Orbits: Mucosal thickening of the right greater than left maxillary sinus. Scattered mucosal thickening throughout ethmoid air cells. Patient appears edentulous of upper teeth,  presumed prior periapical lucency in the right upper molars, contiguous with the right maxillary sinus. No acute orbital abnormality. Mastoid air cells are clear. Other: None. IMPRESSION: 1. No acute intracranial abnormality. 2. Paranasal sinus mucosal thickening.  3. Patient appears edentulous of upper teeth, presumed prior periodontal disease in the right upper molars, contiguous with the right maxillary sinus. Electronically Signed   By: Keith Rake M.D.   On: 02/06/2020 22:18   MR BRAIN WO CONTRAST  Result Date: 02/25/2020 CLINICAL DATA:  Neuro deficit, acute, stroke suspected. Additional history provided: COVID positive. EXAM: MRI HEAD WITHOUT CONTRAST TECHNIQUE: Multiplanar, multiecho pulse sequences of the brain and surrounding structures were obtained without intravenous contrast. COMPARISON:  Noncontrast head CT 02/25/2020. FINDINGS: Brain: Intermittently motion degraded exam. Most notably, there is moderate motion degradation of the axial T2/FLAIR sequence. Small focus of chronic cortical encephalomalacia within the anterolateral right frontal lobe (for instance as seen on series 7, images 14-16). 2 x 10 mm (AP x TV) T1 hyperintense lesion within the mid to posterior pituitary gland (for instance as seen on series 10, image 36). No focal parenchymal signal abnormality is identified elsewhere. There is no acute infarct. No extra-axial fluid collection. No midline shift. Vascular: Expected proximal arterial flow voids. Skull and upper cervical spine: No focal marrow lesion. Sinuses/Orbits: Visualized orbits show no acute finding. Paranasal sinus disease. Most notably, there is extensive partial opacification of the bilateral sphenoid sinuses and moderate right maxillary sinus mucosal thickening. Other: Small bilateral mastoid effusions. IMPRESSION: 1. Intermittently motion degraded exam. 2. No evidence of acute infarct. 3. Small focus of chronic cortical encephalomalacia within the anterolateral right  frontal lobe. 4. 2 x 10 mm T1 hyperintense lesion within the mid-to-posterior pituitary gland. 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 However, a cystic adenoma or adenoma with prior hemorrhage cannot be excluded. Correlate with relevant laboratory values. A pituitary protocol brain MRI may be obtained for further characterization, if clinically warranted. 5. Otherwise unremarkable non-contrast MRI appearance of the brain. 6. Paranasal sinus disease, most notably severe bilateral sphenoid sinusitis. 7. Small bilateral mastoid effusions. Electronically Signed   By: Kellie Simmering DO   On: 02/25/2020 17:45   DG Chest Port 1 View  Result Date: 02/20/2020 CLINICAL DATA:  40 year old female COVID-32. EXAM: PORTABLE CHEST 1 VIEW COMPARISON:  Portable chest 02/19/2020 and earlier. FINDINGS: Portable AP semi upright view at 0448 hours. Stable tracheostomy tube, left IJ approach central line and visible enteric tube. Larger lung volumes and improved ventilation. Largely regressed left upper lung opacity since yesterday. Residual confluent bibasilar opacity, including suspected left lower lobe collapse or consolidation. No pneumothorax. No definite pleural effusion. No acute osseous abnormality identified. Paucity of bowel gas in the upper abdomen. IMPRESSION: 1. Improved bilateral ventilation with residual confluent bibasilar opacity. 2. No new cardiopulmonary abnormality identified. 3.  Stable lines and tubes. Electronically Signed   By: Genevie Ann M.D.   On: 02/20/2020 07:03   DG Chest Port 1 View  Result Date: 02/19/2020 CLINICAL DATA:  Post tracheostomy, COVID positive EXAM: PORTABLE CHEST 1 VIEW COMPARISON:  Earlier same day FINDINGS: Tracheostomy device is present approximately 5 cm above the carina. Left IJ central line and enteric tube are again identified. Persistent bilateral pulmonary opacities similar to the prior study. Probable small pleural effusions. No pneumothorax. Similar  cardiomediastinal contours. IMPRESSION: New tracheostomy device. Otherwise stable lines and tubes. Similar bilateral pulmonary opacities. Electronically Signed   By: Macy Mis M.D.   On: 02/19/2020 16:48   DG Chest Port 1 View  Result Date: 02/19/2020 CLINICAL DATA:  Hypoxia.  Respiratory failure.  COVID-19. EXAM: PORTABLE CHEST 1 VIEW COMPARISON:  02/17/2020. FINDINGS: Endotracheal tube, feeding tube, left IJ  line in stable position. Cardiomegaly with pulmonary venous congestion. Bilateral interstitial infiltrates again noted. Bibasilar atelectasis. Small bilateral pleural effusions again noted. No pneumothorax. IMPRESSION: 1. Lines and tubes in stable position. 2. Cardiomegaly with pulmonary venous congestion. 3. Bilateral interstitial infiltrates again noted. Bibasilar atelectasis. Small bilateral pleural effusions again noted. Electronically Signed   By: Marcello Moores  Register   On: 02/19/2020 05:19   DG CHEST PORT 1 VIEW  Result Date: 02/17/2020 CLINICAL DATA:  Intubation EXAM: PORTABLE CHEST 1 VIEW COMPARISON:  02/16/2020 FINDINGS: No significant interval change in AP portable chest radiograph, with diffuse bilateral interstitial heterogeneous airspace opacity, most conspicuous at the lung bases. Mild cardiomegaly. Support apparatus is unchanged including endotracheal tube, esophagogastric tube, and left neck vascular catheter. IMPRESSION: 1. No significant interval change in AP portable chest radiograph, with diffuse bilateral interstitial heterogeneous airspace opacity, most conspicuous at the lung bases. Findings are consistent with multifocal infection, edema, and/or ARDS. 2.  Unchanged support apparatus. Electronically Signed   By: Eddie Candle M.D.   On: 02/17/2020 11:26   DG CHEST PORT 1 VIEW  Result Date: 02/16/2020 CLINICAL DATA:  Altered mental status EXAM: PORTABLE CHEST 1 VIEW COMPARISON:  Yesterday FINDINGS: Endotracheal tube with tip halfway between the clavicular heads and carina.  The feeding tube at least reaches the diaphragm. Left IJ line with tip at the brachiocephalic SVC confluence. Extensive bilateral pneumonia. Cardiomegaly. No visible air leak. IMPRESSION: Stable hardware positioning and extensive pneumonia. Electronically Signed   By: Monte Fantasia M.D.   On: 02/16/2020 07:08   DG CHEST PORT 1 VIEW  Result Date: 02/15/2020 CLINICAL DATA:  COVID positive.  Ventilator dependence. EXAM: PORTABLE CHEST 1 VIEW COMPARISON:  02/14/2020 FINDINGS: 0531 hours. Endotracheal tube tip is 2.7 cm above the base of the carina. A feeding tube passes into the stomach although the distal tip position is not included on the film. Left IJ central line tip overlies the innominate vein confluence. Stable asymmetric elevation right hemidiaphragm. Relatively diffuse bilateral airspace disease again noted with mid and lower lung predominance. The cardio pericardial silhouette is enlarged. Telemetry leads overlie the chest. IMPRESSION: No substantial interval change in exam. Electronically Signed   By: Misty Stanley M.D.   On: 02/15/2020 09:02   DG CHEST PORT 1 VIEW  Result Date: 02/14/2020 CLINICAL DATA:  ETT EXAM: PORTABLE CHEST 1 VIEW COMPARISON:  Radiograph 02/13/2020 FINDINGS: *Endotracheal tube tip low in the trachea, 2 cm from the carina. Consider retraction 1-2 cm to the mid trachea. *Transesophageal tube tip terminates near the region of the gastric antrum/duodenal bulb. *Left upper IJ approach central venous catheter tip terminates near the left brachiocephalic-caval confluence. Persistent diffuse heterogeneous opacities throughout both lungs in a mid to lower lung predominance, not significantly changed from 1 day prior. Cardiomegaly is stable. Indistinct vascularity. No pneumothorax. Obscuration of the hemidiaphragms may suggest some layering effusion. No acute osseous or soft tissue abnormality. Telemetry leads overlie the chest. IMPRESSION: 1. Endotracheal tube tip low in the  trachea, 2 cm from the carina. Consider retraction 1-2 cm to the mid trachea. 2. Stable diffuse heterogeneous opacities throughout both lungs in a mid to lower lung predominance. Could reflect a combination of edema and infection with likely layering bilateral effusions. These results will be called to the ordering clinician or representative by the Radiologist Assistant, and communication documented in the PACS or [employer]. Electronically Signed   By: Lovena Le M.D.   On: 02/14/2020 05:39   DG CHEST PORT 1 VIEW  Result Date: 02/13/2020 CLINICAL DATA:  Hypoxia EXAM: PORTABLE CHEST 1 VIEW COMPARISON:  February 12, 2020 FINDINGS: Endotracheal tube tip is 1.7 cm above the carina. Enteric tube tip is below the diaphragm. Central catheter tip is in the left innominate vein near the junction with the superior vena cava. No pneumothorax. There is airspace opacity in both mid and lower lung regions with small pleural effusions bilaterally. Heart is enlarged with pulmonary vascularity normal, stable. No adenopathy. No bone lesions. IMPRESSION: Tube and catheter positions as described without pneumothorax. Persistent airspace opacity in the mid and lower lung regions with bilateral pleural effusions. Cardiomegaly. Overall appearance raises concern for a degree of congestive heart failure. There may well be superimposed pneumonia in the lung bases. Both edema and pneumonia may present concurrently. Electronically Signed   By: Lowella Grip III M.D.   On: 02/13/2020 08:03   DG CHEST PORT 1 VIEW  Result Date: 02/12/2020 CLINICAL DATA:  Endotracheal tube placement. EXAM: PORTABLE CHEST 1 VIEW COMPARISON:  02/11/2020. FINDINGS: Endotracheal tube and feeding tube in stable position. Left IJ line stable position. Cardiomegaly. Low lung volumes with bibasilar atelectasis. Diffuse bilateral pulmonary infiltrates/edema again noted. Small bilateral pleural effusions again noted. Chest is unchanged from prior  exam. No pneumothorax. IMPRESSION: 1. Lines and tubes in stable position. 2. Cardiomegaly with persistent bilateral pulmonary infiltrates/edema and small bilateral pleural effusions. 3. Low lung volumes with bibasilar atelectasis. Chest is unchanged from prior exam. Electronically Signed   By: Marcello Moores  Register   On: 02/12/2020 05:21   DG CHEST PORT 1 VIEW  Result Date: 02/11/2020 CLINICAL DATA:  Hypoxia EXAM: PORTABLE CHEST 1 VIEW COMPARISON:  February 08, 2020 FINDINGS: Endotracheal tube tip is 3.0 cm above the carina. Enteric tube tip is below the diaphragm. Central catheter tip is in the superior vena cava slightly beyond the junction with the left innominate vein. No pneumothorax. There is cardiomegaly with pulmonary venous hypertension. There is multifocal airspace opacity throughout the lungs bilaterally with equivocal pleural effusions bilaterally. No bone lesions. No adenopathy appreciable. IMPRESSION: Tube and catheter positions as described without pneumothorax. Cardiomegaly with pulmonary vascular congestion. Small pleural effusions. Multifocal airspace opacity may represent pulmonary edema or multifocal pneumonia. Both entities may be present concurrently. Appearance overall similar to recent study. Electronically Signed   By: Lowella Grip III M.D.   On: 02/11/2020 08:51   DG Chest Port 1 View  Result Date: 02/07/2020 CLINICAL DATA:  Acute respiratory failure. Pneumonia due to COVID-19. EXAM: PORTABLE CHEST 1 VIEW COMPARISON:  Radiograph yesterday. FINDINGS: Progressive heterogeneous bilateral airspace opacities, confluent in the lower lobes. Heart size grossly normal but obscured by adjacent airspace disease. Overall low lung volumes. No evidence of pneumomediastinum or pneumothorax. Soft tissue attenuation from habitus limits assessment. IMPRESSION: Bilateral COVID pneumonia which is progressed from yesterday. Low lung volumes persist. Electronically Signed   By: Keith Rake M.D.    On: 02/07/2020 15:36   DG Chest Port 1 View  Result Date: 02/06/2020 CLINICAL DATA:  Sepsis.  Encephalopathy. EXAM: PORTABLE CHEST 1 VIEW COMPARISON:  None. FINDINGS: Multifocal airspace opacity throughout both lungs. Small pleural effusions. No pneumothorax. IMPRESSION: Multifocal bilateral airspace opacities concerning for multifocal infection. Electronically Signed   By: Ulyses Jarred M.D.   On: 02/06/2020 19:36   EEG adult  Result Date: 02/17/2020 Lora Havens, MD     02/17/2020  3:05 PM Patient Name: MERITA HAWKS MRN: [id number] Epilepsy Attending: Lora Havens Referring Physician/Provider: Dr Brand Males Date: 02/17/2020  Duration: 24.30 mins Patient history: 40yo F with ams in setting of sepsis and Covid. EEG to evaluate for seizure Level of alertness:  comatose AEDs during EEG study: Versed Technical aspects: This EEG study was done with scalp electrodes positioned according to the 10-20 International system of electrode placement. Electrical activity was acquired at a sampling rate of _0  and reviewed with a high frequency filter of _1  and a low frequency filter of _2 . EEG data were recorded continuously and digitally stored. Description: EEG showed continuous generalized 3 to 6 Hz theta-delta slowing admixed with an excessive amount of 15 to 18 Hz beta activity distributed symmetrically and diffusely.  Hyperventilation and photic stimulation were not performed.   ABNORMALITY -Excessive beta, generalized -Continuous slow, generalized IMPRESSION: This study is suggestive of severe diffuse encephalopathy, nonspecific etiology but likely related to sedation. No seizures or epileptiform discharges were seen throughout the recording. Lora Havens   ECHOCARDIOGRAM COMPLETE  Result Date: 02/12/2020    ECHOCARDIOGRAM REPORT   Patient Name:   TOWANNA AVERY Date of Exam: 02/12/2020 Medical Rec #:  [id number]        Height:       63.0 in Accession #:    [id number]        Weight:       240.5 lb Date of Birth:  1981/01/08        BSA:          2.091 m Patient Age:    69 years         BP:           99/70 mmHg Patient Gender: F                HR:           54 bpm. Exam Location:  Inpatient Procedure: 2D Echo, Cardiac Doppler and Color Doppler Indications:    Acute respiratory distress R06.03  History:        Patient has no prior history of Echocardiogram examinations.  Sonographer:    Clayton Lefort RDCS (AE) Referring Phys: [id number]  Sonographer Comments: Image acquisition challenging due to patient body habitus. IMPRESSIONS  1. Left ventricular ejection fraction, by estimation, is 55 to 60%. The left ventricle has normal function. The left ventricle has no regional wall motion abnormalities. Left ventricular diastolic parameters were normal.  2. Right ventricular systolic function is normal. The right ventricular size is normal. The estimated right ventricular systolic pressure is 29.5 mmHg.  3. The mitral valve is grossly normal. Trivial mitral valve regurgitation.  4. The aortic valve is tricuspid. Aortic valve regurgitation is not visualized.  5. The inferior vena cava is normal in size with greater than 50% respiratory variability, suggesting right atrial pressure of 3 mmHg. Conclusion(s)/Recommendation(s): Normal biventricular function without evidence of hemodynamically significant valvular heart disease. FINDINGS  Left Ventricle: Left ventricular ejection fraction, by estimation, is 55 to 60%. The left ventricle has normal function. The left ventricle has no regional wall motion abnormalities. The left ventricular internal cavity size was normal in size. There is  no left ventricular hypertrophy. Left ventricular diastolic parameters were normal. Right Ventricle: The right ventricular size is normal. No increase in right ventricular wall thickness. Right ventricular systolic function is normal. The tricuspid regurgitant velocity is 2.26 m/s, and with an assumed right  atrial pressure of 3 mmHg, the estimated right ventricular systolic pressure is 28.4 mmHg. Left Atrium: Left atrial size was normal in size. Right Atrium: Right  atrial size was normal in size. Pericardium: There is no evidence of pericardial effusion. Mitral Valve: The mitral valve is grossly normal. Trivial mitral valve regurgitation. Tricuspid Valve: The tricuspid valve is grossly normal. Tricuspid valve regurgitation is trivial. Aortic Valve: The aortic valve is tricuspid. Aortic valve regurgitation is not visualized. Aortic valve mean gradient measures 3.0 mmHg. Aortic valve peak gradient measures 5.8 mmHg. Aortic valve area, by VTI measures 2.60 cm. Pulmonic Valve: The pulmonic valve was grossly normal. Pulmonic valve regurgitation is trivial. Aorta: The aortic root and ascending aorta are structurally normal, with no evidence of dilitation. Venous: The inferior vena cava is normal in size with greater than 50% respiratory variability, suggesting right atrial pressure of 3 mmHg. IAS/Shunts: No atrial level shunt detected by color flow Doppler.  LEFT VENTRICLE PLAX 2D LVIDd:         5.20 cm  Diastology LVIDs:         3.80 cm  LV e' medial:    9.90 cm/s LV PW:         1.30 cm  LV E/e' medial:  7.5 LV IVS:        1.00 cm  LV e' lateral:   10.10 cm/s LVOT diam:     2.20 cm  LV E/e' lateral: 7.4 LV SV:         67 LV SV Index:   32 LVOT Area:     3.80 cm  RIGHT VENTRICLE             IVC RV Basal diam:  2.60 cm     IVC diam: 1.10 cm RV S prime:     11.10 cm/s TAPSE (M-mode): 1.9 cm LEFT ATRIUM             Index       RIGHT ATRIUM           Index LA diam:        2.90 cm 1.39 cm/m  RA Area:     14.60 cm LA Vol (A2C):   49.8 ml 23.81 ml/m RA Volume:   35.90 ml  17.17 ml/m LA Vol (A4C):   37.8 ml 18.07 ml/m LA Biplane Vol: 46.4 ml 22.19 ml/m  AORTIC VALVE AV Area (Vmax):    2.65 cm AV Area (Vmean):   2.80 cm AV Area (VTI):     2.60 cm AV Vmax:           120.00 cm/s AV Vmean:          76.100 cm/s AV VTI:             0.259 m AV Peak Grad:      5.8 mmHg AV Mean Grad:      3.0 mmHg LVOT Vmax:         83.60 cm/s LVOT Vmean:        56.100 cm/s LVOT VTI:          0.177 m LVOT/AV VTI ratio: 0.68  AORTA Ao Root diam: 3.30 cm Ao Asc diam:  2.60 cm MITRAL VALVE               TRICUSPID VALVE MV Area (PHT): 3.21 cm    TR Peak grad:   20.4 mmHg MV Decel Time: 236 msec    TR Vmax:        226.00 cm/s MV E velocity: 74.60 cm/s MV A velocity: 40.70 cm/s  SHUNTS MV E/A ratio:  1.83        Systemic  VTI:  0.18 m                            Systemic Diam: 2.20 cm Lyman Bishop MD Electronically signed by Lyman Bishop MD Signature Date/Time: 02/12/2020/6:17:29 PM    Final    CT HEAD CODE STROKE WO CONTRAST`  Result Date: 02/25/2020 CLINICAL DATA:  Code stroke.  Left-sided weakness EXAM: CT HEAD WITHOUT CONTRAST TECHNIQUE: Contiguous axial images were obtained from the base of the skull through the vertex without intravenous contrast. COMPARISON:  None. FINDINGS: Brain: There is no acute intracranial hemorrhage, mass effect, or edema. Gray-white differentiation is preserved. No extra-axial collection. Ventricles and sulci are normal in size and configuration. Vascular: No hyperdense vessel. Skull: Unremarkable. Sinuses/Orbits: Nonspecific paranasal sinus opacification primarily involving right maxillary and both sphenoid sinuses. There is thinning or dehiscence of the inferior right maxillary sinus wall. Orbits are unremarkable. Other: Minimal mastoid opacification. ASPECTS ([place] Stroke Program Early CT Score) - Ganglionic level infarction (caudate, lentiform nuclei, internal capsule, insula, M1-M3 cortex): 7 - Supraganglionic infarction (M4-M6 cortex): 3 Total score (0-10 with 10 being normal): 10 IMPRESSION: There is no acute intracranial hemorrhage or evidence of acute infarction. ASPECT score is 10. These results were communicated to Dr. Lorrin Goodell at 12:15 pm on 02/25/2020 by text page via the [facility] messaging system. Electronically  Signed   By: Macy Mis M.D.   On: 02/25/2020 12:36   VAS [place] LOWER EXTREMITY VENOUS (DVT)  Result Date: 02/18/2020  Lower Venous DVT Study Indications: Covid-19, elevated D-Dimer.  Comparison Study: No prior study Performing Technologist: Sharion Dove RVS  Examination Guidelines: A complete evaluation includes B-mode imaging, spectral Doppler, color Doppler, and power Doppler as needed of all accessible portions of each vessel. Bilateral testing is considered an integral part of a complete examination. Limited examinations for reoccurring indications may be performed as noted. The reflux portion of the exam is performed with the patient in reverse Trendelenburg.  +---------+---------------+---------+-----------+----------+--------------+ RIGHT    CompressibilityPhasicitySpontaneityPropertiesThrombus Aging +---------+---------------+---------+-----------+----------+--------------+ CFV      Full           Yes      Yes                                 +---------+---------------+---------+-----------+----------+--------------+ SFJ      Full                                                        +---------+---------------+---------+-----------+----------+--------------+ FV Prox  Full                                                        +---------+---------------+---------+-----------+----------+--------------+ FV Mid   Full                                                        +---------+---------------+---------+-----------+----------+--------------+ FV DistalFull                                                        +---------+---------------+---------+-----------+----------+--------------+  PFV      Full                                                        +---------+---------------+---------+-----------+----------+--------------+ POP      Full           Yes      Yes                                  +---------+---------------+---------+-----------+----------+--------------+ PTV      Full                                                        +---------+---------------+---------+-----------+----------+--------------+ PERO     Full                                                        +---------+---------------+---------+-----------+----------+--------------+   +---------+---------------+---------+-----------+----------+--------------+ LEFT     CompressibilityPhasicitySpontaneityPropertiesThrombus Aging +---------+---------------+---------+-----------+----------+--------------+ CFV      Full           Yes      Yes                                 +---------+---------------+---------+-----------+----------+--------------+ SFJ      Full                                                        +---------+---------------+---------+-----------+----------+--------------+ FV Prox  Full                                                        +---------+---------------+---------+-----------+----------+--------------+ FV Mid   Full                                                        +---------+---------------+---------+-----------+----------+--------------+ FV DistalFull                                                        +---------+---------------+---------+-----------+----------+--------------+ PFV      Full                                                        +---------+---------------+---------+-----------+----------+--------------+  POP      Full           Yes      Yes                                 +---------+---------------+---------+-----------+----------+--------------+ PTV      Full                                                        +---------+---------------+---------+-----------+----------+--------------+ PERO     Full                                                         +---------+---------------+---------+-----------+----------+--------------+     Summary: BILATERAL: - No evidence of deep vein thrombosis seen in the lower extremities, bilaterally. -No evidence of popliteal cyst, bilaterally.   *See table(s) above for measurements and observations. Electronically signed by Ruta Hinds MD on 02/18/2020 at 5:24:02 PM.    Final

## 2020-02-28 ENCOUNTER — Inpatient Hospital Stay (HOSPITAL_COMMUNITY): Payer: Medicaid Other

## 2020-02-28 DIAGNOSIS — G9341 Metabolic encephalopathy: Secondary | ICD-10-CM | POA: Diagnosis not present

## 2020-02-28 DIAGNOSIS — J9601 Acute respiratory failure with hypoxia: Secondary | ICD-10-CM | POA: Diagnosis not present

## 2020-02-28 DIAGNOSIS — Z4682 Encounter for fitting and adjustment of non-vascular catheter: Secondary | ICD-10-CM | POA: Diagnosis not present

## 2020-02-28 DIAGNOSIS — U071 COVID-19: Secondary | ICD-10-CM | POA: Diagnosis not present

## 2020-02-28 DIAGNOSIS — D649 Anemia, unspecified: Secondary | ICD-10-CM | POA: Diagnosis not present

## 2020-02-28 DIAGNOSIS — R748 Abnormal levels of other serum enzymes: Secondary | ICD-10-CM | POA: Diagnosis not present

## 2020-02-28 DIAGNOSIS — E237 Disorder of pituitary gland, unspecified: Secondary | ICD-10-CM | POA: Diagnosis not present

## 2020-02-28 LAB — RETICULOCYTES
Immature Retic Fract: 27.4 % — ABNORMAL HIGH (ref 2.3–15.9)
RBC.: 3.51 MIL/uL — ABNORMAL LOW (ref 3.87–5.11)
Retic Count, Absolute: 121.8 10*3/uL (ref 19.0–186.0)
Retic Ct Pct: 3.5 % — ABNORMAL HIGH (ref 0.4–3.1)

## 2020-02-28 LAB — GLUCOSE, CAPILLARY
Glucose-Capillary: 94 mg/dL (ref 70–99)
Glucose-Capillary: 73 mg/dL (ref 70–99)
Glucose-Capillary: 78 mg/dL (ref 70–99)
Glucose-Capillary: 91 mg/dL (ref 70–99)
Glucose-Capillary: 124 mg/dL — ABNORMAL HIGH (ref 70–99)
Glucose-Capillary: 93 mg/dL (ref 70–99)
Glucose-Capillary: 134 mg/dL — ABNORMAL HIGH (ref 70–99)

## 2020-02-28 LAB — COMPREHENSIVE METABOLIC PANEL
ALT: 116 U/L — ABNORMAL HIGH (ref 0–44)
AST: 62 U/L — ABNORMAL HIGH (ref 15–41)
Albumin: 3.1 g/dL — ABNORMAL LOW (ref 3.5–5.0)
Alkaline Phosphatase: 100 U/L (ref 38–126)
Anion gap: 15 (ref 5–15)
BUN: 39 mg/dL — ABNORMAL HIGH (ref 6–20)
CO2: 30 mmol/L (ref 22–32)
Calcium: 9.2 mg/dL (ref 8.9–10.3)
Chloride: 98 mmol/L (ref 98–111)
Creatinine, Ser: 0.79 mg/dL (ref 0.44–1.00)
GFR, Estimated: >60 mL/min (ref >60)
Glucose, Bld: 96 mg/dL (ref 70–99)
Potassium: 4 mmol/L (ref 3.5–5.1)
Sodium: 143 mmol/L (ref 135–145)
Total Bilirubin: 0.8 mg/dL (ref 0.3–1.2)
Total Protein: 7.5 g/dL (ref 6.5–8.1)

## 2020-02-28 LAB — TSH: TSH: 3.45 u[IU]/mL (ref 0.350–4.500)

## 2020-02-28 LAB — CBC
HCT: 32.6 % — ABNORMAL LOW (ref 36.0–46.0)
Hemoglobin: 10.6 g/dL — ABNORMAL LOW (ref 12.0–15.0)
MCH: 31.2 pg (ref 26.0–34.0)
MCHC: 32.5 g/dL (ref 30.0–36.0)
MCV: 95.9 fL (ref 80.0–100.0)
Platelets: 248 10*3/uL (ref 150–400)
RBC: 3.4 MIL/uL — ABNORMAL LOW (ref 3.87–5.11)
RDW: 13.7 % (ref 11.5–15.5)
WBC: 7.7 10*3/uL (ref 4.0–10.5)
nRBC: 0 % (ref 0.0–0.2)

## 2020-02-28 LAB — HEPATITIS PANEL, ACUTE
HCV Ab: NONREACTIVE
Hep A IgM: NONREACTIVE
Hep B C IgM: NONREACTIVE
Hepatitis B Surface Ag: NONREACTIVE

## 2020-02-28 LAB — FOLATE: Folate: 18.9 ng/mL (ref >5.9)

## 2020-02-28 LAB — IRON AND TIBC
Iron: 74 ug/dL (ref 28–170)
Saturation Ratios: 20 % (ref 10.4–31.8)
TIBC: 378 ug/dL (ref 250–450)
UIBC: 304 ug/dL

## 2020-02-28 LAB — FERRITIN: Ferritin: 630 ng/mL — ABNORMAL HIGH (ref 11–307)

## 2020-02-28 LAB — T4, FREE: Free T4: 1.03 ng/dL (ref 0.61–1.12)

## 2020-02-28 LAB — VITAMIN B12: Vitamin B-12: 590 pg/mL (ref 180–914)

## 2020-02-28 MED ORDER — SODIUM BICARBONATE 650 MG PO TABS
650.0000 mg | ORAL_TABLET | Freq: Once | ORAL | Status: AC
  Administered 2020-02-28: 650 mg
  Filled 2020-02-28: qty 1

## 2020-02-28 MED ORDER — DEXTROSE 5 % IV SOLN: INTRAVENOUS | Status: DC

## 2020-02-28 MED ORDER — ONDANSETRON HCL 4 MG/2ML IJ SOLN
4.0000 mg | Freq: Four times a day (QID) | INTRAMUSCULAR | Status: DC | PRN
  Administered 2020-02-28 – 2020-03-03 (×8): 4 mg via INTRAVENOUS
  Filled 2020-02-28 (×10): qty 2

## 2020-02-28 MED ORDER — PANCRELIPASE (LIP-PROT-AMYL) 10440-39150 UNITS PO TABS
20880.0000 [IU] | ORAL_TABLET | Freq: Once | ORAL | Status: AC
  Administered 2020-02-28: 20880 [IU]
  Filled 2020-02-28: qty 2

## 2020-02-28 MED ORDER — HYDROMORPHONE HCL 2 MG PO TABS
2.0000 mg | ORAL_TABLET | Freq: Four times a day (QID) | ORAL | Status: DC
  Administered 2020-02-28 – 2020-02-29 (×3): 2 mg
  Filled 2020-02-28 (×3): qty 1

## 2020-02-28 MED ORDER — POLYETHYLENE GLYCOL 3350 17 G PO PACK: 17.0000 g | PACK | Freq: Every day | ORAL | Status: DC | PRN

## 2020-02-28 NOTE — Progress Notes (Signed)
Attempted to unclog cortrak with the unclogging feeding tube protocol. Was able to somewhat unclog and give some medications however patient started to feel nauseas and like she was going to throw up and cortrak clogged up again. Abdomen distended. Notified MD & received zofran order, abdominal x-ray order & consulted cortrak teem however does not appear to be here today. Will continue to monitor.   Pt has diet ordered but very minimal intake due to "feeling full." Tube feeds held since last night when cortrak clogged.   Dewaine Oats, RN

## 2020-02-28 NOTE — Progress Notes (Signed)
Pt's cortrak occluded after multiple attempts of troubleshooting. Tube feeding stopped and Dr. Helyn App notified. Per MD to start D5 infusion and recheck BG at 0630 then resume q4 BG checks.

## 2020-02-28 NOTE — Progress Notes (Signed)
PROGRESS NOTE                                                                             PROGRESS NOTE                                                                                                                                                                                                             Patient Demographics:    Belinda [name], is a 40 y.o. female, DOB - August 25, 1980, EYE:[id number]  Outpatient Primary MD for the patient is Kathyrn Drown, MD    LOS - 22  Admit date - 02/06/2020    Chief Complaint  Patient presents with  . Altered Mental Status       Brief Narrative    40 year old white female, only medical history is obesity.  Presented to [facility] ER 12/15 w/ ARDS and acute metabolic encephalopathy 2/2 COVID   12/15 presented to the emergency room, hypoxic, encephalopathic, Covid positive.CT brain negative for acute injury with only some mild para sinus mucosal thickening with some periodontal disease.  Started on supplemental oxygen, IV Solu-Medrol, IV remdesivir, and baricitinib.  Also started empirically on cefepime  and vancomycin to cover for potential bacterial pneumonia 12/16 multiple reports of worsening confusion, intermittent combativeness, attempts to bite staff.  Not able to take oral medications.  Started on Precedex.  Transferred to [facility]. Changed abx to unasyn, added low dose ativan as pt chronically on xanax.  12/17: Patient more hypoxic, requiring more sedation. Decision made to proceed with intubation.  Intubated, left IJ catheter placed.  PF ratio only 88.  Core track ordered.  Neuromuscular blockade initiated.  Prone protocol initiated.  12/19 - No overnight events but this morning hypertensive and tachycardic. EKG personally reviewed, sinus tachycardia with normal axis. BIS values are low in the 30s. She is still on continuous paralytics.   12/20 -neuromuscular blockade discontinued yesterday.  At  this point in time patient is extremely agitated on the ventilator despite Dilaudid infusion Versed infusion, oxycodone schedule, clonopoin  scheduloed,  and phenobarb once daily at night.  She is on prednisone and Barcitinib. On vent 50%. AFebrikle  12/21  - 12/21 - cxr horrible. 40% fio2, pulse ox 90%,.  On dilaudid gtt, oxycdone po, precedex gtt, versed gt and klonopin,  On phenobarb QHS x 2 night.  On haldol schedulesd x 1 dayt.   -. RASS -4 but easily goes to +2 per RN. On TF.  K 2.9 while on 60mkcl daily. + 4L volume overloa  12/22 - 40% fio2. On dilaudid gtt, [precedex gtt, versed gtt. On oxy and klonopin. Still very agitated On TF. -2L since admit  12/23 -> stil with significant intermittent agitation. On 30% fio2, Now on diprivan gtt, precedex gtt, fent gtt, versed gtt, levophed gtt. On vent, 30% fio2  12/24   - ? Low grade fever. 30% fio2 on vent. On fent gtt, prededex gtt, ketamine gtt, versed gtt . Also on levophed gtt. Some breakthrough agitation per RN but much better overall. Off diprivan gtt  12/26 -  On fent gtt, precedex gtt, ketamnie gtt, versed gtt. On TF. On free water.  Low grade fever + . On vent  - 30  12/28 Tracheostomy-paralytics off    Subjective:   Patient noted to be lying comfortably on the bed. Mildly distracted but same as yesterday. No complaints offered. Has difficulty communicating due to her tracheostomy.    Assessment  & Plan :   Acute hypoxic respiratory failure/ARDS/status post tracheostomy  Patient has history of asthma at baseline.  Was admitted to the intensive care unit initially.  Was placed on the ventilator.  Was on paralytics.  Subsequently underwent tracheostomy on 12/28. Required ventilator support at nighttime but has not required in the last 48 hours. Has been tolerating trach collar without difficulty. Discussed with pulmonology today. Okay for transfer out of the ICU. Patient was also treated with Ancef for presumed bacterial  pneumonia. Tracheostomy care per pulmonology.  Pneumonia due to COVID-19  Patient is stable from a COVID-19 standpoint. She is status post treatment with Remdesivir and steroids. She also completed course of baricitinib. Can discontinue Covid isolation from today onwards.   Klebsiella urinary tract infection with sepsis syndrome with or without aspiration pneumonia  - unasyn ended 02/11/20 - Staph NOS VAP on 12/26 - Resp Cx MSSA on respiratory culture, complete cefazolin 02/23/18/2022 Remains stable from an infectious disease standpoint. Currently not on any antibacterials.  Acute metabolic encephalopathy Apparently uses alprazolam on a chronic basis. Has history of opioid overdose in 2018. Unclear if she has been on Suboxone or not. Not listed in her home medications.  Encephalopathy is in the setting of sepsis and Covid infection.  Started on Risperdal with improvement.  Patient also noted to be on clonazepam.  History of opioid dependence It looks like patient was started on scheduled methadone on 12/25 by PCCM. This was mainly as patient was quite encephalopathic and there was concern that she might be going through opioid withdrawal. Home medication list reviewed.  Only alprazolam as noted.  Unclear if the patient was on Suboxone prior to admission.  We will need to wean her off of the methadone eventually.   Noted to be on scheduled hydromorphone as well.  Volume overload ECHO normal 02/12/20. Patient on daily furosemide.  Monitor ins and outs daily weights.  Hypophosphatemia Replete as needed  Anemia of critical illness Hemoglobin stable.  No evidence of overt bleeding. Anemia panel reviewed. Folate was 18.9, B12 590, ferritin 630.  Mild  transaminitis Likely due to acute illness and COVID-19. Stable for the most part. Hepatitis panel is pending.  Nutrition Currently on tube feedings.  Speech therapy is following. Tube got occluded overnight. Discussed with nursing staff  who will try the declogging protocol. May need to contact the core track team again.  Prediabetes with Hyperglycemia HbA1c 5.8.  Continue with SSI and Levemir as long as she is on tube feedings. No CBGs noted this morning likely because she is off of tube feedings.  Hypernatrmia Improved after she was started on free water.  Concern for pituitary lesion This was incidentally noted on MRI brain.  Will need a dedicated pituitary protocol MRI when patient is more stable. TSH 3.45 and free T4 1.03. The following tests are still pending: serum prolactin, insulin like growth factor, plasma ACTH level.   DVT prophylaxis: Lovenox CODE STATUS: Full code Family communication: We will update family later today Disposition: Will likely need to go to rehabilitation when medically stable.  Status is: Inpatient  Remains inpatient appropriate because:IV treatments appropriate due to intensity of illness or inability to take PO and Inpatient level of care appropriate due to severity of illness   Dispo: The patient is from: Home              Anticipated d/c is to: CIR              Anticipated d/c date is: > 3 days              Patient currently is not medically stable to d/c.      Consults  :  PCCM, Neurology  Procedures  :  Trach       Inpatient Medications  Scheduled Meds: . vitamin C  500 mg Per Tube Daily  . chlorhexidine gluconate (MEDLINE KIT)  15 mL Mouth Rinse BID  . Chlorhexidine Gluconate Cloth  6 each Topical Daily  . clonazePAM  2.5 mg Per Tube BID  . enoxaparin (LOVENOX) injection  0.5 mg/kg Subcutaneous Q24H  . feeding supplement (PROSource TF)  45 mL Per Tube TID  . free water  200 mL Per Tube Q6H  . furosemide  20 mg Intravenous Daily  . HYDROmorphone  4 mg Per Tube Q6H  . insulin aspart  0-20 Units Subcutaneous Q4H  . insulin detemir  10 Units Subcutaneous BID  . lipase/protease/amylase)  20,880 Units Per Tube Once   And  . sodium bicarbonate  650 mg Per Tube  Once  . mouth rinse  15 mL Mouth Rinse 10 times per day  . melatonin  3 mg Per Tube QHS  . methadone  20 mg Per Tube Q12H  . midazolam  5 mg Intravenous Once  . multivitamin with minerals  1 tablet Per Tube Daily  . pantoprazole sodium  40 mg Per Tube Daily  . potassium chloride  20 mEq Per Tube Daily  . risperiDONE  3 mg Per Tube QHS  . thiamine injection  100 mg Intravenous Daily  . zinc sulfate  220 mg Per Tube Daily   Continuous Infusions: . sodium chloride Stopped (02/25/20 0849)  . dextrose 50 mL/hr at 02/28/20 1000  . feeding supplement (VITAL 1.5 CAL) Stopped (02/28/20 0415)  . lactated ringers 10 mL/hr at 02/08/20 1118   PRN Meds:.sodium chloride, acetaminophen, docusate, fentaNYL (SUBLIMAZE) injection, ipratropium-albuterol, LORazepam, polyethylene glycol, Resource ThickenUp Clear  Antibiotics  :    Anti-infectives (From admission, onward)   Start     Dose/Rate Route Frequency Ordered  Stop   02/18/20 1200  ceFAZolin (ANCEF) IVPB 2g/100 mL premix        2 g 200 mL/hr over 30 Minutes Intravenous Every 8 hours 02/18/20 1104 02/23/20 2044   02/17/20 2000  vancomycin (VANCOCIN) IVPB 1000 mg/200 mL premix  Status:  Discontinued        1,000 mg 200 mL/hr over 60 Minutes Intravenous Every 8 hours 02/17/20 1146 02/18/20 1104   02/17/20 1345  vancomycin (VANCOREADY) IVPB 2000 mg/400 mL        2,000 mg 200 mL/hr over 120 Minutes Intravenous  Once 02/17/20 1257 02/17/20 1752   02/17/20 1245  vancomycin (VANCOCIN) 2,000 mg in sodium chloride 0.9 % 500 mL IVPB  Status:  Discontinued        2,000 mg 250 mL/hr over 120 Minutes Intravenous  Once 02/17/20 1146 02/17/20 1445   02/07/20 1600  Ampicillin-Sulbactam (UNASYN) 3 g in sodium chloride 0.9 % 100 mL IVPB        3 g 200 mL/hr over 30 Minutes Intravenous Every 6 hours 02/07/20 1513 02/11/20 2145   02/07/20 1000  remdesivir 100 mg in sodium chloride 0.9 % 100 mL IVPB        100 mg 200 mL/hr over 30 Minutes Intravenous Daily  02/06/20 2132 02/10/20 1103   02/07/20 0800  vancomycin (VANCOCIN) IVPB 750 mg/150 ml premix  Status:  Discontinued        750 mg 150 mL/hr over 60 Minutes Intravenous Every 12 hours 02/06/20 2023 02/07/20 1510   02/07/20 0400  ceFEPIme (MAXIPIME) 2 g in sodium chloride 0.9 % 100 mL IVPB  Status:  Discontinued        2 g 200 mL/hr over 30 Minutes Intravenous Every 8 hours 02/06/20 2023 02/07/20 1510   02/06/20 2145  remdesivir 100 mg in sodium chloride 0.9 % 100 mL IVPB        100 mg 200 mL/hr over 30 Minutes Intravenous Every 30 min 02/06/20 2132 02/06/20 2305   02/06/20 1915  vancomycin (VANCOREADY) IVPB 2000 mg/400 mL        2,000 mg 200 mL/hr over 120 Minutes Intravenous  Once 02/06/20 1907 02/06/20 2250   02/06/20 1845  ceFEPIme (MAXIPIME) 2 g in sodium chloride 0.9 % 100 mL IVPB        2 g 200 mL/hr over 30 Minutes Intravenous  Once 02/06/20 1834 02/06/20 2056   02/06/20 1845  metroNIDAZOLE (FLAGYL) IVPB 500 mg        500 mg 100 mL/hr over 60 Minutes Intravenous  Once 02/06/20 1834 02/06/20 2204   02/06/20 1845  vancomycin (VANCOCIN) IVPB 1000 mg/200 mL premix  Status:  Discontinued        1,000 mg 200 mL/hr over 60 Minutes Intravenous  Once 02/06/20 1834 02/06/20 1907       Bonnielee Haff M.D on 02/28/2020 at 10:53 AM  To page go to [email or web address]   [facility] -  Office  [phone]     Objective:   Vitals:   02/28/20 0730 02/28/20 0800 02/28/20 0900 02/28/20 1000  BP:  99/68 101/78 108/74  Pulse:  (!) 103 (!) 114 (!) 115  Resp:  18 (!) 28 17  Temp: 98.9 F (37.2 C)     TempSrc: Axillary     SpO2:  94% 96% 97%  Weight:      Height:        Wt Readings from Last 3 Encounters:  02/28/20 93.6 kg     Intake/Output Summary (  Last 24 hours) at 02/28/2020 1053 Last data filed at 02/28/2020 1000 Gross per 24 hour  Intake 1648.15 ml  Output 1575 ml  Net 73.15 ml     Physical Exam  General appearance: Awake alert.  In no distress Tracheostomy noted.  Nasogastric feeding tube noted. Resp: Mildly tachypneic. Coarse breath sounds with crackles bilateral bases. No wheezing or rhonchi. Cardio: S1-S2 is tachycardic regular.  No S3-S4.  No rubs murmurs or bruit GI: Abdomen is soft.  Nontender nondistended.  Bowel sounds are present normal.  No masses organomegaly Extremities: No edema. Physical deconditioning noted Neurologic:  No focal neurological deficits.        Data Review:    CBC Recent Labs  Lab 02/22/20 0803 02/23/20 0603 02/26/20 0400 02/27/20 0350 02/28/20 0614  WBC  --  9.8 8.6 7.9 7.7  HGB 9.9* 10.2* 11.0* 11.2* 10.6*  HCT 29.0* 31.0* 35.7* 36.5 32.6*  PLT  --  295 275 251 248  MCV  --  96.0 98.1 97.6 95.9  MCH  --  31.6 30.2 29.9 31.2  MCHC  --  32.9 30.8 30.7 32.5  RDW  --  13.8 13.8 13.7 13.7  LYMPHSABS  --  1.9  --   --   --   MONOABS  --  0.8  --   --   --   EOSABS  --  0.1  --   --   --   BASOSABS  --  0.0  --   --   --     Recent Labs  Lab 02/22/20 0803 02/23/20 0603 02/26/20 0400 02/27/20 0350 02/28/20 0614  NA 139 143 146* 145 143  K 4.6 4.3 4.1 4.0 4.0  CL  --  98 104 103 98  CO2  --  32 _0 GLUCOSE  --  150* 189* 97 96  BUN  --  23* 49* 48* 39*  CREATININE  --  0.62 0.81 0.73 0.79  CALCIUM  --  9.4 9.5 9.6 9.2  AST  --   --   --  83* 62*  ALT  --   --   --  145* 116*  ALKPHOS  --   --   --  104 100  BILITOT  --   --   --  0.8 0.8  ALBUMIN  --   --   --  3.3* 3.1*  MG  --  2.6* 2.7*  --   --   TSH  --   --   --   --  3.450      Lab Results  Component Value Date   HGBA1C 5.8 (H) 02/08/2020    Micro Results Recent Results (from the past 240 hour(s))  Culture, respiratory (non-expectorated)     Status: None   Collection Time: 02/20/20 10:58 AM   Specimen: Tracheal Aspirate; Respiratory  Result Value Ref Range Status   Specimen Description TRACHEAL ASPIRATE  Final   Special Requests NONE  Final   Gram Stain   Final    NO WBC SEEN RARE GRAM POSITIVE COCCI IN  PAIRS Performed at [facility], [address] [place], [place] [address]    Culture FEW STAPHYLOCOCCUS AUREUS  Final   Report Status 02/22/2020 FINAL  Final   Organism ID, Bacteria STAPHYLOCOCCUS AUREUS  Final      Susceptibility   Staphylococcus aureus - MIC*    CIPROFLOXACIN <=0.5 SENSITIVE Sensitive     ERYTHROMYCIN <=0.25 SENSITIVE Sensitive  GENTAMICIN <=0.5 SENSITIVE Sensitive     OXACILLIN 0.5 SENSITIVE Sensitive     TETRACYCLINE <=1 SENSITIVE Sensitive     VANCOMYCIN <=0.5 SENSITIVE Sensitive     TRIMETH/SULFA <=10 SENSITIVE Sensitive     CLINDAMYCIN <=0.25 SENSITIVE Sensitive     RIFAMPIN <=0.5 SENSITIVE Sensitive     Inducible Clindamycin NEGATIVE Sensitive     * FEW STAPHYLOCOCCUS AUREUS    Radiology Reports DG Chest 1 View  Result Date: 02/08/2020 CLINICAL DATA:  OG tube placement. EXAM: CHEST  1 VIEW COMPARISON:  Chest x-ray 02/07/2020. FINDINGS: 539 hours. Bilateral airspace disease noted, left greater than right. Endotracheal tube tip is approximately 4.3 cm above the base of the carina. Left IJ central line tip overlies the innominate vein confluence. NG tube tip is positioned in the stomach with the proximal side port below the GE junction. Telemetry leads overlie the chest. IMPRESSION: NG tube tip is in the stomach with proximal side port of the tube below the GE junction. Electronically Signed   By: Misty Stanley M.D.   On: 02/08/2020 05:54   CT HEAD WO CONTRAST  Result Date: 02/18/2020 CLINICAL DATA:  Delirium. Altered mental status. EXAM: CT HEAD WITHOUT CONTRAST TECHNIQUE: Contiguous axial images were obtained from the base of the skull through the vertex without intravenous contrast. COMPARISON:  CT head without contrast 09/01/2017 at [facility]. FINDINGS: Brain: No acute infarct, hemorrhage, or mass lesion is present. No significant white matter lesions are present. The ventricles are of normal size. No significant extraaxial fluid collection  is present. The brainstem and cerebellum are within normal limits. Vascular: No hyperdense vessel or unexpected calcification. Skull: Calvarium is intact. No focal lytic or blastic lesions are present. No significant extracranial soft tissue lesion is present. Sinuses/Orbits: Patient is intubated. Fluid level is present in the right maxillary sinus. Fluid is present in the nasopharynx. Fluid is present in the sphenoid sinuses and posterior ethmoid air cells bilaterally. Left nasogastric tube is noted. Minimal mastoid fluid is present inferiorly on both sides. IMPRESSION: 1. Normal CT appearance of the brain. 2. Fluid in the paranasal sinuses and nasopharynx likely related to intubation. 3. Minimal mastoid fluid inferiorly on both sides. Electronically Signed   By: San Morelle M.D.   On: 02/18/2020 02:16   CT Head Wo Contrast  Result Date: 02/06/2020 CLINICAL DATA:  Altered mental status.  Recent tooth extraction. EXAM: CT HEAD WITHOUT CONTRAST TECHNIQUE: Contiguous axial images were obtained from the base of the skull through the vertex without intravenous contrast. COMPARISON:  Head CT 09/01/2017 FINDINGS: Brain: No intracranial hemorrhage, mass effect, or midline shift. No hydrocephalus. The basilar cisterns are patent. No evidence of territorial infarct or acute ischemia. No extra-axial or intracranial fluid collection. Vascular: No hyperdense vessel or unexpected calcification. Skull: No fracture or focal lesion. Sinuses/Orbits: Mucosal thickening of the right greater than left maxillary sinus. Scattered mucosal thickening throughout ethmoid air cells. Patient appears edentulous of upper teeth, presumed prior periapical lucency in the right upper molars, contiguous with the right maxillary sinus. No acute orbital abnormality. Mastoid air cells are clear. Other: None. IMPRESSION: 1. No acute intracranial abnormality. 2. Paranasal sinus disease. 3. Patient appears edentulous of upper teeth, presumed  prior periapical lucency in the right upper molars, contiguous with the right maxillary sinus. CLINICAL DATA:  Altered mental status. Recent tooth extraction. EXAM: CT HEAD WITHOUT CONTRAST TECHNIQUE: Contiguous axial images were obtained from the base of the skull through the vertex without intravenous contrast. COMPARISON:  Head CT 09/01/2017 FINDINGS: Brain: No intracranial hemorrhage, mass effect, or midline shift. No hydrocephalus. The basilar cisterns are patent. No evidence of territorial infarct or acute ischemia. No extra-axial or intracranial fluid collection. Vascular: No hyperdense vessel or unexpected calcification. Skull: No fracture or focal lesion. Sinuses/Orbits: Mucosal thickening of the right greater than left maxillary sinus. Scattered mucosal thickening throughout ethmoid air cells. Patient appears edentulous of upper teeth, presumed prior periapical lucency in the right upper molars, contiguous with the right maxillary sinus. No acute orbital abnormality. Mastoid air cells are clear. Other: None. IMPRESSION: 1. No acute intracranial abnormality. 2. Paranasal sinus mucosal thickening. 3. Patient appears edentulous of upper teeth, presumed prior periodontal disease in the right upper molars, contiguous with the right maxillary sinus. Electronically Signed   By: Keith Rake M.D.   On: 02/06/2020 22:18   MR BRAIN WO CONTRAST  Result Date: 02/25/2020 CLINICAL DATA:  Neuro deficit, acute, stroke suspected. Additional history provided: COVID positive. EXAM: MRI HEAD WITHOUT CONTRAST TECHNIQUE: Multiplanar, multiecho pulse sequences of the brain and surrounding structures were obtained without intravenous contrast. COMPARISON:  Noncontrast head CT 02/25/2020. FINDINGS: Brain: Intermittently motion degraded exam. Most notably, there is moderate motion degradation of the axial T2/FLAIR sequence. Small focus of chronic cortical encephalomalacia within the anterolateral right frontal lobe (for  instance as seen on series 7, images 14-16). 2 x 10 mm (AP x TV) T1 hyperintense lesion within the mid to posterior pituitary gland (for instance as seen on series 10, image 36). No focal parenchymal signal abnormality is identified elsewhere. There is no acute infarct. No extra-axial fluid collection. No midline shift. Vascular: Expected proximal arterial flow voids. Skull and upper cervical spine: No focal marrow lesion. Sinuses/Orbits: Visualized orbits show no acute finding. Paranasal sinus disease. Most notably, there is extensive partial opacification of the bilateral sphenoid sinuses and moderate right maxillary sinus mucosal thickening. Other: Small bilateral mastoid effusions. IMPRESSION: 1. Intermittently motion degraded exam. 2. No evidence of acute infarct. 3. Small focus of chronic cortical encephalomalacia within the anterolateral right frontal lobe. 4. 2 x 10 mm T1 hyperintense lesion within the mid-to-posterior pituitary gland. 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 However, a cystic adenoma or adenoma with prior hemorrhage cannot be excluded. Correlate with relevant laboratory values. A pituitary protocol brain MRI may be obtained for further characterization, if clinically warranted. 5. Otherwise unremarkable non-contrast MRI appearance of the brain. 6. Paranasal sinus disease, most notably severe bilateral sphenoid sinusitis. 7. Small bilateral mastoid effusions. Electronically Signed   By: Kellie Simmering DO   On: 02/25/2020 17:45   DG Chest Port 1 View  Result Date: 02/20/2020 CLINICAL DATA:  40 year old female COVID-19. EXAM: PORTABLE CHEST 1 VIEW COMPARISON:  Portable chest 02/19/2020 and earlier. FINDINGS: Portable AP semi upright view at 0448 hours. Stable tracheostomy tube, left IJ approach central line and visible enteric tube. Larger lung volumes and improved ventilation. Largely regressed left upper lung opacity since yesterday. Residual confluent bibasilar  opacity, including suspected left lower lobe collapse or consolidation. No pneumothorax. No definite pleural effusion. No acute osseous abnormality identified. Paucity of bowel gas in the upper abdomen. IMPRESSION: 1. Improved bilateral ventilation with residual confluent bibasilar opacity. 2. No new cardiopulmonary abnormality identified. 3.  Stable lines and tubes. Electronically Signed   By: Genevie Ann M.D.   On: 02/20/2020 07:03   DG Chest Port 1 View  Result Date: 02/19/2020 CLINICAL DATA:  Post tracheostomy, COVID positive EXAM: PORTABLE CHEST  1 VIEW COMPARISON:  Earlier same day FINDINGS: Tracheostomy device is present approximately 5 cm above the carina. Left IJ central line and enteric tube are again identified. Persistent bilateral pulmonary opacities similar to the prior study. Probable small pleural effusions. No pneumothorax. Similar cardiomediastinal contours. IMPRESSION: New tracheostomy device. Otherwise stable lines and tubes. Similar bilateral pulmonary opacities. Electronically Signed   By: Macy Mis M.D.   On: 02/19/2020 16:48   DG Chest Port 1 View  Result Date: 02/19/2020 CLINICAL DATA:  Hypoxia.  Respiratory failure.  COVID-19. EXAM: PORTABLE CHEST 1 VIEW COMPARISON:  02/17/2020. FINDINGS: Endotracheal tube, feeding tube, left IJ line in stable position. Cardiomegaly with pulmonary venous congestion. Bilateral interstitial infiltrates again noted. Bibasilar atelectasis. Small bilateral pleural effusions again noted. No pneumothorax. IMPRESSION: 1. Lines and tubes in stable position. 2. Cardiomegaly with pulmonary venous congestion. 3. Bilateral interstitial infiltrates again noted. Bibasilar atelectasis. Small bilateral pleural effusions again noted. Electronically Signed   By: Marcello Moores  Register   On: 02/19/2020 05:19   DG CHEST PORT 1 VIEW  Result Date: 02/17/2020 CLINICAL DATA:  Intubation EXAM: PORTABLE CHEST 1 VIEW COMPARISON:  02/16/2020 FINDINGS: No significant interval  change in AP portable chest radiograph, with diffuse bilateral interstitial heterogeneous airspace opacity, most conspicuous at the lung bases. Mild cardiomegaly. Support apparatus is unchanged including endotracheal tube, esophagogastric tube, and left neck vascular catheter. IMPRESSION: 1. No significant interval change in AP portable chest radiograph, with diffuse bilateral interstitial heterogeneous airspace opacity, most conspicuous at the lung bases. Findings are consistent with multifocal infection, edema, and/or ARDS. 2.  Unchanged support apparatus. Electronically Signed   By: Eddie Candle M.D.   On: 02/17/2020 11:26   DG CHEST PORT 1 VIEW  Result Date: 02/16/2020 CLINICAL DATA:  Altered mental status EXAM: PORTABLE CHEST 1 VIEW COMPARISON:  Yesterday FINDINGS: Endotracheal tube with tip halfway between the clavicular heads and carina. The feeding tube at least reaches the diaphragm. Left IJ line with tip at the brachiocephalic SVC confluence. Extensive bilateral pneumonia. Cardiomegaly. No visible air leak. IMPRESSION: Stable hardware positioning and extensive pneumonia. Electronically Signed   By: Monte Fantasia M.D.   On: 02/16/2020 07:08   DG CHEST PORT 1 VIEW  Result Date: 02/15/2020 CLINICAL DATA:  COVID positive.  Ventilator dependence. EXAM: PORTABLE CHEST 1 VIEW COMPARISON:  02/14/2020 FINDINGS: 0531 hours. Endotracheal tube tip is 2.7 cm above the base of the carina. A feeding tube passes into the stomach although the distal tip position is not included on the film. Left IJ central line tip overlies the innominate vein confluence. Stable asymmetric elevation right hemidiaphragm. Relatively diffuse bilateral airspace disease again noted with mid and lower lung predominance. The cardio pericardial silhouette is enlarged. Telemetry leads overlie the chest. IMPRESSION: No substantial interval change in exam. Electronically Signed   By: Misty Stanley M.D.   On: 02/15/2020 09:02   DG CHEST  PORT 1 VIEW  Result Date: 02/14/2020 CLINICAL DATA:  ETT EXAM: PORTABLE CHEST 1 VIEW COMPARISON:  Radiograph 02/13/2020 FINDINGS: *Endotracheal tube tip low in the trachea, 2 cm from the carina. Consider retraction 1-2 cm to the mid trachea. *Transesophageal tube tip terminates near the region of the gastric antrum/duodenal bulb. *Left upper IJ approach central venous catheter tip terminates near the left brachiocephalic-caval confluence. Persistent diffuse heterogeneous opacities throughout both lungs in a mid to lower lung predominance, not significantly changed from 1 day prior. Cardiomegaly is stable. Indistinct vascularity. No pneumothorax. Obscuration of the hemidiaphragms may suggest some layering  effusion. No acute osseous or soft tissue abnormality. Telemetry leads overlie the chest. IMPRESSION: 1. Endotracheal tube tip low in the trachea, 2 cm from the carina. Consider retraction 1-2 cm to the mid trachea. 2. Stable diffuse heterogeneous opacities throughout both lungs in a mid to lower lung predominance. Could reflect a combination of edema and infection with likely layering bilateral effusions. These results will be called to the ordering clinician or representative by the Radiologist Assistant, and communication documented in the PACS or [employer]. Electronically Signed   By: Lovena Le M.D.   On: 02/14/2020 05:39   DG CHEST PORT 1 VIEW  Result Date: 02/13/2020 CLINICAL DATA:  Hypoxia EXAM: PORTABLE CHEST 1 VIEW COMPARISON:  February 12, 2020 FINDINGS: Endotracheal tube tip is 1.7 cm above the carina. Enteric tube tip is below the diaphragm. Central catheter tip is in the left innominate vein near the junction with the superior vena cava. No pneumothorax. There is airspace opacity in both mid and lower lung regions with small pleural effusions bilaterally. Heart is enlarged with pulmonary vascularity normal, stable. No adenopathy. No bone lesions. IMPRESSION: Tube and catheter positions  as described without pneumothorax. Persistent airspace opacity in the mid and lower lung regions with bilateral pleural effusions. Cardiomegaly. Overall appearance raises concern for a degree of congestive heart failure. There may well be superimposed pneumonia in the lung bases. Both edema and pneumonia may present concurrently. Electronically Signed   By: Lowella Grip III M.D.   On: 02/13/2020 08:03   DG CHEST PORT 1 VIEW  Result Date: 02/12/2020 CLINICAL DATA:  Endotracheal tube placement. EXAM: PORTABLE CHEST 1 VIEW COMPARISON:  02/11/2020. FINDINGS: Endotracheal tube and feeding tube in stable position. Left IJ line stable position. Cardiomegaly. Low lung volumes with bibasilar atelectasis. Diffuse bilateral pulmonary infiltrates/edema again noted. Small bilateral pleural effusions again noted. Chest is unchanged from prior exam. No pneumothorax. IMPRESSION: 1. Lines and tubes in stable position. 2. Cardiomegaly with persistent bilateral pulmonary infiltrates/edema and small bilateral pleural effusions. 3. Low lung volumes with bibasilar atelectasis. Chest is unchanged from prior exam. Electronically Signed   By: Marcello Moores  Register   On: 02/12/2020 05:21   DG CHEST PORT 1 VIEW  Result Date: 02/11/2020 CLINICAL DATA:  Hypoxia EXAM: PORTABLE CHEST 1 VIEW COMPARISON:  February 08, 2020 FINDINGS: Endotracheal tube tip is 3.0 cm above the carina. Enteric tube tip is below the diaphragm. Central catheter tip is in the superior vena cava slightly beyond the junction with the left innominate vein. No pneumothorax. There is cardiomegaly with pulmonary venous hypertension. There is multifocal airspace opacity throughout the lungs bilaterally with equivocal pleural effusions bilaterally. No bone lesions. No adenopathy appreciable. IMPRESSION: Tube and catheter positions as described without pneumothorax. Cardiomegaly with pulmonary vascular congestion. Small pleural effusions. Multifocal airspace opacity may  represent pulmonary edema or multifocal pneumonia. Both entities may be present concurrently. Appearance overall similar to recent study. Electronically Signed   By: Lowella Grip III M.D.   On: 02/11/2020 08:51   DG Chest Port 1 View  Result Date: 02/07/2020 CLINICAL DATA:  Acute respiratory failure. Pneumonia due to COVID-19. EXAM: PORTABLE CHEST 1 VIEW COMPARISON:  Radiograph yesterday. FINDINGS: Progressive heterogeneous bilateral airspace opacities, confluent in the lower lobes. Heart size grossly normal but obscured by adjacent airspace disease. Overall low lung volumes. No evidence of pneumomediastinum or pneumothorax. Soft tissue attenuation from habitus limits assessment. IMPRESSION: Bilateral COVID pneumonia which is progressed from yesterday. Low lung volumes persist. Electronically Signed  By: Keith Rake M.D.   On: 02/07/2020 15:36   DG Chest Port 1 View  Result Date: 02/06/2020 CLINICAL DATA:  Sepsis.  Encephalopathy. EXAM: PORTABLE CHEST 1 VIEW COMPARISON:  None. FINDINGS: Multifocal airspace opacity throughout both lungs. Small pleural effusions. No pneumothorax. IMPRESSION: Multifocal bilateral airspace opacities concerning for multifocal infection. Electronically Signed   By: Ulyses Jarred M.D.   On: 02/06/2020 19:36   DG Swallowing Func-Speech Pathology  Result Date: 02/27/2020 Objective Swallowing Evaluation: Type of Study: MBS-Modified Barium Swallow Study  Patient Details Name: ARYAM ZHAN MRN: [id number] Date of Birth: [birth year] Today's Date: 02/27/2020 Time: SLP Start Time (ACUTE ONLY): 1358 -SLP Stop Time (ACUTE ONLY): 1420 SLP Time Calculation (min) (ACUTE ONLY): 22 min Past Medical History: No past medical history on file. Past Surgical History: No past surgical history on file. HPI: Pt is a 40 year old female with medical history significant for obesity.  She presented to [facility] ED on 12/15 with 24 to 48 hours of AMS following exposure to her COVID+  son. CXR showed diffuse bilateral airspace disease, she was confused and agitated. Physical restraints applied on admission and Precedex infusion given.  She required 100% nonrebreather mask and was transferred to [facility] for further intervention. CXR 12/29: Improved bilateral ventilation with residual confluent bibasilar opacity. CT head negative. ETT 12/17-trach 12/28. Stat CT head completed on 1/3 due to increased weakness noted by PT and was negative.  No data recorded Assessment / Plan / Recommendation CHL IP CLINICAL IMPRESSIONS 02/27/2020 Clinical Impression Pt demonstrates a moderate oropharyngeal dysphagia likely secondry to prolonged intubation with decreased glottic closure. Pt has adequate oral phase (though no upper dentition) and timely swallow, though there is silent aspiration during the swallow with thin and nectar thick liquids regardless of bolus size and despite a chin tuck. Pt was not wearing her PMSV during this exam (pt COVID positive and PMSV left in room) and Cortrak was also in place. Suspect that pt may be able to quickly upgrade to nectar when cortrak comes out and pt has PMSV in place to aid in cough/throat clear response. For now pt able to initaite a dys 2/honey thick diet. WIll f/u for tolerance. SLP Visit Diagnosis Dysphagia, unspecified (R13.10) Attention and concentration deficit following -- Frontal lobe and executive function deficit following -- Impact on safety and function Moderate aspiration risk   CHL IP TREATMENT RECOMMENDATION 02/27/2020 Treatment Recommendations Therapy as outlined in treatment plan below   Prognosis 02/27/2020 Prognosis for Safe Diet Advancement Good Barriers to Reach Goals -- Barriers/Prognosis Comment -- CHL IP DIET RECOMMENDATION 02/27/2020 SLP Diet Recommendations Dysphagia 2 (Fine chop) solids;Honey thick liquids Liquid Administration via Cup;Straw Medication Administration Whole meds with puree Compensations Slow rate;Small sips/bites Postural Changes  Seated upright at 90 degrees   CHL IP OTHER RECOMMENDATIONS 02/27/2020 Recommended Consults -- Oral Care Recommendations -- Other Recommendations Place PMSV during PO intake;Have oral suction available   CHL IP FOLLOW UP RECOMMENDATIONS 02/27/2020 Follow up Recommendations Inpatient Rehab   CHL IP FREQUENCY AND DURATION 02/27/2020 Speech Therapy Frequency (ACUTE ONLY) min 2x/week Treatment Duration 2 weeks      CHL IP ORAL PHASE 02/27/2020 Oral Phase Impaired Oral - Pudding Teaspoon -- Oral - Pudding Cup -- Oral - Honey Teaspoon WFL Oral - Honey Cup WFL Oral - Nectar Teaspoon WFL Oral - Nectar Cup WFL Oral - Nectar Straw WFL Oral - Thin Teaspoon -- Oral - Thin Cup WFL Oral - Thin Straw WFL Oral - Puree  [facility] Oral - Mech Soft -- Oral - Regular -- Oral - Multi-Consistency -- Oral - Pill -- Oral Phase - Comment --  CHL IP PHARYNGEAL PHASE 02/27/2020 Pharyngeal Phase Impaired Pharyngeal- Pudding Teaspoon -- Pharyngeal -- Pharyngeal- Pudding Cup -- Pharyngeal -- Pharyngeal- Honey Teaspoon -- Pharyngeal -- Pharyngeal- Honey Cup WFL Pharyngeal -- Pharyngeal- Nectar Teaspoon -- Pharyngeal -- Pharyngeal- Nectar Cup Penetration/Aspiration during swallow;Reduced airway/laryngeal closure Pharyngeal Material enters airway, passes BELOW cords without attempt by patient to eject out (silent aspiration) Pharyngeal- Nectar Straw Penetration/Aspiration during swallow;Reduced airway/laryngeal closure Pharyngeal Material enters airway, passes BELOW cords without attempt by patient to eject out (silent aspiration) Pharyngeal- Thin Teaspoon -- Pharyngeal -- Pharyngeal- Thin Cup Penetration/Aspiration during swallow Pharyngeal Material enters airway, passes BELOW cords without attempt by patient to eject out (silent aspiration) Pharyngeal- Thin Straw Penetration/Aspiration during swallow Pharyngeal Material enters airway, passes BELOW cords without attempt by patient to eject out (silent aspiration) Pharyngeal- Puree WFL Pharyngeal -- Pharyngeal-  Mechanical Soft WFL Pharyngeal -- Pharyngeal- Regular -- Pharyngeal -- Pharyngeal- Multi-consistency -- Pharyngeal -- Pharyngeal- Pill -- Pharyngeal -- Pharyngeal Comment --  No flowsheet data found. DeBlois, Katherene Ponto 02/27/2020, 2:47 PM              EEG adult  Result Date: 02/17/2020 Lora Havens, MD     02/17/2020  3:05 PM Patient Name: DARREL BARONI MRN: [id number] Epilepsy Attending: Lora Havens Referring Physician/Provider: Dr Brand Males Date: 02/17/2020 Duration: 24.30 mins Patient history: 40yo F with ams in setting of sepsis and Covid. EEG to evaluate for seizure Level of alertness:  comatose AEDs during EEG study: Versed Technical aspects: This EEG study was done with scalp electrodes positioned according to the 10-20 International system of electrode placement. Electrical activity was acquired at a sampling rate of _0  and reviewed with a high frequency filter of _1  and a low frequency filter of _2 . EEG data were recorded continuously and digitally stored. Description: EEG showed continuous generalized 3 to 6 Hz theta-delta slowing admixed with an excessive amount of 15 to 18 Hz beta activity distributed symmetrically and diffusely.  Hyperventilation and photic stimulation were not performed.   ABNORMALITY -Excessive beta, generalized -Continuous slow, generalized IMPRESSION: This study is suggestive of severe diffuse encephalopathy, nonspecific etiology but likely related to sedation. No seizures or epileptiform discharges were seen throughout the recording. Lora Havens   ECHOCARDIOGRAM COMPLETE  Result Date: 02/12/2020    ECHOCARDIOGRAM REPORT   Patient Name:   NELDA LUCKEY Date of Exam: 02/12/2020 Medical Rec #:  [id number]        Height:       63.0 in Accession #:    [id number]       Weight:       240.5 lb Date of Birth:  18-Jun-1980        BSA:          2.091 m Patient Age:    83 years         BP:           99/70 mmHg Patient Gender: F                HR:            54 bpm. Exam Location:  Inpatient Procedure: 2D Echo, Cardiac Doppler and Color Doppler Indications:    Acute respiratory distress R06.03  History:        Patient has no prior history of Echocardiogram examinations.  Sonographer:  Clayton Lefort RDCS (AE) Referring Phys: [id number] [facility]  Sonographer Comments: Image acquisition challenging due to patient body habitus. IMPRESSIONS  1. Left ventricular ejection fraction, by estimation, is 55 to 60%. The left ventricle has normal function. The left ventricle has no regional wall motion abnormalities. Left ventricular diastolic parameters were normal.  2. Right ventricular systolic function is normal. The right ventricular size is normal. The estimated right ventricular systolic pressure is 42.8 mmHg.  3. The mitral valve is grossly normal. Trivial mitral valve regurgitation.  4. The aortic valve is tricuspid. Aortic valve regurgitation is not visualized.  5. The inferior vena cava is normal in size with greater than 50% respiratory variability, suggesting right atrial pressure of 3 mmHg. Conclusion(s)/Recommendation(s): Normal biventricular function without evidence of hemodynamically significant valvular heart disease. FINDINGS  Left Ventricle: Left ventricular ejection fraction, by estimation, is 55 to 60%. The left ventricle has normal function. The left ventricle has no regional wall motion abnormalities. The left ventricular internal cavity size was normal in size. There is  no left ventricular hypertrophy. Left ventricular diastolic parameters were normal. Right Ventricle: The right ventricular size is normal. No increase in right ventricular wall thickness. Right ventricular systolic function is normal. The tricuspid regurgitant velocity is 2.26 m/s, and with an assumed right atrial pressure of 3 mmHg, the estimated right ventricular systolic pressure is 76.8 mmHg. Left Atrium: Left atrial size was normal in size. Right Atrium: Right atrial size was  normal in size. Pericardium: There is no evidence of pericardial effusion. Mitral Valve: The mitral valve is grossly normal. Trivial mitral valve regurgitation. Tricuspid Valve: The tricuspid valve is grossly normal. Tricuspid valve regurgitation is trivial. Aortic Valve: The aortic valve is tricuspid. Aortic valve regurgitation is not visualized. Aortic valve mean gradient measures 3.0 mmHg. Aortic valve peak gradient measures 5.8 mmHg. Aortic valve area, by VTI measures 2.60 cm. Pulmonic Valve: The pulmonic valve was grossly normal. Pulmonic valve regurgitation is trivial. Aorta: The aortic root and ascending aorta are structurally normal, with no evidence of dilitation. Venous: The inferior vena cava is normal in size with greater than 50% respiratory variability, suggesting right atrial pressure of 3 mmHg. IAS/Shunts: No atrial level shunt detected by color flow Doppler.  LEFT VENTRICLE PLAX 2D LVIDd:         5.20 cm  Diastology LVIDs:         3.80 cm  LV e' medial:    9.90 cm/s LV PW:         1.30 cm  LV E/e' medial:  7.5 LV IVS:        1.00 cm  LV e' lateral:   10.10 cm/s LVOT diam:     2.20 cm  LV E/e' lateral: 7.4 LV SV:         67 LV SV Index:   32 LVOT Area:     3.80 cm  RIGHT VENTRICLE             IVC RV Basal diam:  2.60 cm     IVC diam: 1.10 cm RV S prime:     11.10 cm/s TAPSE (M-mode): 1.9 cm LEFT ATRIUM             Index       RIGHT ATRIUM           Index LA diam:        2.90 cm 1.39 cm/m  RA Area:     14.60 cm LA Vol (A2C):  49.8 ml 23.81 ml/m RA Volume:   35.90 ml  17.17 ml/m LA Vol (A4C):   37.8 ml 18.07 ml/m LA Biplane Vol: 46.4 ml 22.19 ml/m  AORTIC VALVE AV Area (Vmax):    2.65 cm AV Area (Vmean):   2.80 cm AV Area (VTI):     2.60 cm AV Vmax:           120.00 cm/s AV Vmean:          76.100 cm/s AV VTI:            0.259 m AV Peak Grad:      5.8 mmHg AV Mean Grad:      3.0 mmHg LVOT Vmax:         83.60 cm/s LVOT Vmean:        56.100 cm/s LVOT VTI:          0.177 m LVOT/AV VTI ratio:  0.68  AORTA Ao Root diam: 3.30 cm Ao Asc diam:  2.60 cm MITRAL VALVE               TRICUSPID VALVE MV Area (PHT): 3.21 cm    TR Peak grad:   20.4 mmHg MV Decel Time: 236 msec    TR Vmax:        226.00 cm/s MV E velocity: 74.60 cm/s MV A velocity: 40.70 cm/s  SHUNTS MV E/A ratio:  1.83        Systemic VTI:  0.18 m                            Systemic Diam: 2.20 cm Lyman Bishop MD Electronically signed by Lyman Bishop MD Signature Date/Time: 02/12/2020/6:17:29 PM    Final    CT HEAD CODE STROKE WO CONTRAST`  Result Date: 02/25/2020 CLINICAL DATA:  Code stroke.  Left-sided weakness EXAM: CT HEAD WITHOUT CONTRAST TECHNIQUE: Contiguous axial images were obtained from the base of the skull through the vertex without intravenous contrast. COMPARISON:  None. FINDINGS: Brain: There is no acute intracranial hemorrhage, mass effect, or edema. Gray-white differentiation is preserved. No extra-axial collection. Ventricles and sulci are normal in size and configuration. Vascular: No hyperdense vessel. Skull: Unremarkable. Sinuses/Orbits: Nonspecific paranasal sinus opacification primarily involving right maxillary and both sphenoid sinuses. There is thinning or dehiscence of the inferior right maxillary sinus wall. Orbits are unremarkable. Other: Minimal mastoid opacification. ASPECTS ([place] Stroke Program Early CT Score) - Ganglionic level infarction (caudate, lentiform nuclei, internal capsule, insula, M1-M3 cortex): 7 - Supraganglionic infarction (M4-M6 cortex): 3 Total score (0-10 with 10 being normal): 10 IMPRESSION: There is no acute intracranial hemorrhage or evidence of acute infarction. ASPECT score is 10. These results were communicated to Dr. Lorrin Goodell at 12:15 pm on 02/25/2020 by text page via the [facility] messaging system. Electronically Signed   By: Macy Mis M.D.   On: 02/25/2020 12:36   VAS [place] LOWER EXTREMITY VENOUS (DVT)  Result Date: 02/18/2020  Lower Venous DVT Study Indications: Covid-19, elevated  D-Dimer.  Comparison Study: No prior study Performing Technologist: Sharion Dove RVS  Examination Guidelines: A complete evaluation includes B-mode imaging, spectral Doppler, color Doppler, and power Doppler as needed of all accessible portions of each vessel. Bilateral testing is considered an integral part of a complete examination. Limited examinations for reoccurring indications may be performed as noted. The reflux portion of the exam is performed with the patient in reverse Trendelenburg.  +---------+---------------+---------+-----------+----------+--------------+ RIGHT    CompressibilityPhasicitySpontaneityPropertiesThrombus Aging +---------+---------------+---------+-----------+----------+--------------+  CFV      Full           Yes      Yes                                 +---------+---------------+---------+-----------+----------+--------------+ SFJ      Full                                                        +---------+---------------+---------+-----------+----------+--------------+ FV Prox  Full                                                        +---------+---------------+---------+-----------+----------+--------------+ FV Mid   Full                                                        +---------+---------------+---------+-----------+----------+--------------+ FV DistalFull                                                        +---------+---------------+---------+-----------+----------+--------------+ PFV      Full                                                        +---------+---------------+---------+-----------+----------+--------------+ POP      Full           Yes      Yes                                 +---------+---------------+---------+-----------+----------+--------------+ PTV      Full                                                        +---------+---------------+---------+-----------+----------+--------------+  PERO     Full                                                        +---------+---------------+---------+-----------+----------+--------------+   +---------+---------------+---------+-----------+----------+--------------+ LEFT     CompressibilityPhasicitySpontaneityPropertiesThrombus Aging +---------+---------------+---------+-----------+----------+--------------+ CFV      Full           Yes      Yes                                 +---------+---------------+---------+-----------+----------+--------------+  SFJ      Full                                                        +---------+---------------+---------+-----------+----------+--------------+ FV Prox  Full                                                        +---------+---------------+---------+-----------+----------+--------------+ FV Mid   Full                                                        +---------+---------------+---------+-----------+----------+--------------+ FV DistalFull                                                        +---------+---------------+---------+-----------+----------+--------------+ PFV      Full                                                        +---------+---------------+---------+-----------+----------+--------------+ POP      Full           Yes      Yes                                 +---------+---------------+---------+-----------+----------+--------------+ PTV      Full                                                        +---------+---------------+---------+-----------+----------+--------------+ PERO     Full                                                        +---------+---------------+---------+-----------+----------+--------------+     Summary: BILATERAL: - No evidence of deep vein thrombosis seen in the lower extremities, bilaterally. -No evidence of popliteal cyst, bilaterally.   *See table(s) above for measurements and  observations. Electronically signed by Ruta Hinds MD on 02/18/2020 at 5:24:02 PM.    Final

## 2020-02-28 NOTE — Progress Notes (Signed)
Physical Therapy Treatment Patient Details Name: Belinda [name] MRN: [id number] DOB: 27-Jul-1980 Today's Date: 02/28/2020    History of Present Illness 40 yo admitted 12/15 with AMS, fever, ARDS due to Covid 19 (+) and UTI. Intubated 12/17, trach 12/28. Pt with initial agitation/ delirium during admission. 1/3 pt with impaired balance and function with CT negative for stroke. PMhx: obesity    PT Comments    Pt pleasant, asking for soda (thickened), able to joke and respond to her hair in knots and progress. Pt mouthing words and at times speaking (PMSV present throughout). Pt with excellent progression with improved coordination of LUE and eyes from last session with this therapist. Pt maintains right bias particularly with gait, tight heel cords with increased time to get heels to ground in standing and impaired tolerance for activity as HR up to 151 standing at sink. Pt provided gait belt to assist with heel cord stretch in sitting end of session.   HR 125-151 SpO2 >90% on 35% FiO2   Follow Up Recommendations  CIR;Supervision/Assistance - 24 hour     Equipment Recommendations  Rolling walker with 5" wheels;3in1 (PT)    Recommendations for Other Services       Precautions / Restrictions Precautions Precautions: Fall Precaution Comments: trach, cortrak, watch HR    Mobility  Bed Mobility Overal bed mobility: Needs Assistance Bed Mobility: Supine to Sit     Supine to sit: Min guard;HOB elevated     General bed mobility comments: Heavy use of rails with VC for problem solving, increased time, guarding for lines and safety  Transfers Overall transfer level: Needs assistance   Transfers: Sit to/from Stand Sit to Stand: Min guard;Min assist;+2 safety/equipment         General transfer comment: min +2 assist to stand from bed initially, minguard to stand from recliner with repeated cues for hand placement  Ambulation/Gait Ambulation/Gait assistance: Mod assist;+2  physical assistance Gait Distance (Feet): 5 Feet Assistive device: Rolling walker (2 wheeled) Gait Pattern/deviations: Step-to pattern;Trunk flexed;Narrow base of support   Gait velocity interpretation: <1.8 ft/sec, indicate of risk for recurrent falls General Gait Details: pt with tendency to have narrow BOS with feet to left and trunk to right with cues for posture, position in RW, safety and increased BOS. Mod assist due to right lean, chair pulled behind pt   Stairs             Wheelchair Mobility    Modified Rankin (Stroke Patients Only)       Balance Overall balance assessment: Needs assistance   Sitting balance-Leahy Scale: Fair Sitting balance - Comments: R bias; poor midline orientation   Standing balance support: Bilateral upper extremity supported Standing balance-Leahy Scale: Poor Standing balance comment: reliant on external support, right lean with gait. min assist for static standing at sink                            Cognition Arousal/Alertness: Awake/alert Behavior During Therapy: WFL for tasks assessed/performed Overall Cognitive Status: Impaired/Different from baseline Area of Impairment: Orientation;Attention;Memory;Following commands;Safety/judgement;Awareness;Problem solving                 Orientation Level: Disoriented to;Time Current Attention Level: Sustained Memory: Decreased recall of precautions;Decreased short-term memory Following Commands: Follows one step commands consistently Safety/Judgement: Decreased awareness of safety;Decreased awareness of deficits Awareness: Emergent Problem Solving: Slow processing General Comments: improved cognition however not to baseline. Joking with therapists appropriately  Exercises      General Comments        Pertinent Vitals/Pain Pain Assessment: Faces Faces Pain Scale: Hurts little more Pain Location: with heel cord stretching in standing Pain Descriptors /  Indicators: Grimacing;Guarding Pain Intervention(s): Limited activity within patient's tolerance;Repositioned;Monitored during session    Home Living                      Prior Function            PT Goals (current goals can now be found in the care plan section) Progress towards PT goals: Progressing toward goals    Frequency    Min 3X/week      PT Plan Current plan remains appropriate    Co-evaluation PT/OT/SLP Co-Evaluation/Treatment: Yes Reason for Co-Treatment: Complexity of the patient's impairments (multi-system involvement);For patient/therapist safety PT goals addressed during session: Mobility/safety with mobility;Proper use of DME;Balance OT goals addressed during session: ADL's and self-care      AM-PAC PT "6 Clicks" Mobility   Outcome Measure  Help needed turning from your back to your side while in a flat bed without using bedrails?: A Little Help needed moving from lying on your back to sitting on the side of a flat bed without using bedrails?: A Little Help needed moving to and from a bed to a chair (including a wheelchair)?: A Little Help needed standing up from a chair using your arms (e.g., wheelchair or bedside chair)?: A Little Help needed to walk in hospital room?: A Lot Help needed climbing 3-5 steps with a railing? : Total 6 Click Score: 15    End of Session Equipment Utilized During Treatment: Gait belt Activity Tolerance: Patient tolerated treatment well Patient left: in chair;with call bell/phone within reach;with chair alarm set Nurse Communication: Mobility status PT Visit Diagnosis: Other abnormalities of gait and mobility (R26.89);Muscle weakness (generalized) (M62.81);Other symptoms and signs involving the nervous system (R29.898)     Time: 2094-7096 PT Time Calculation (min) (ACUTE ONLY): 38 min  Charges:  $Gait Training: 8-22 mins                     Bayard Males, PT Acute Rehabilitation Services Pager:  [phone] Office: [phone]    Taylour Lietzke B Edy Belt 02/28/2020, 1:34 PM

## 2020-02-28 NOTE — Progress Notes (Signed)
Occupational Therapy Treatment Note  Excellent session today. Pt able to donn socks in bed, progress to EOB with min guard A and ambulate to sink with mod A +2 to complete oral care in standing positioning with 2 rest breaks. Pt not back to baseline cognitively however more appropriate and animated throughout session. Appropriately joking with therapists. Max HR 151 with activity and SpO2 maintaining above 90 with activity. Called to update her parents on her progress. Appropriate for CIR level rehab when medically ready. Pt very motivated to get stronger and return home. Will continue to follow. Pt very appreciative.    02/28/20 1300  OT Visit Information  Last OT Received On 02/28/20  Assistance Needed +2  PT/OT/SLP Co-Evaluation/Treatment Yes  Reason for Co-Treatment Complexity of the patient's impairments (multi-system involvement);For patient/therapist safety;To address functional/ADL transfers  OT goals addressed during session ADL's and self-care  History of Present Illness 40 yo admitted 12/15 with AMS, fever, ARDS due to Covid 19 (+) and UTI. Intubated 12/17, trach 12/28. Pt with maintained agitation delirium since admission. PMhx: obesity.  Scans negative for stroke.  Precautions  Precautions Fall  Precaution Comments trach, cortrak  Pain Assessment  Pain Assessment Faces  Faces Pain Scale 4  Pain Location with heel cord stretching in standing  Pain Descriptors / Indicators Grimacing;Guarding  Pain Intervention(s) Limited activity within patient's tolerance  Cognition  Arousal/Alertness Awake/alert  Behavior During Therapy WFL for tasks assessed/performed  Overall Cognitive Status Impaired/Different from baseline  Area of Impairment Orientation;Attention;Memory;Following commands;Safety/judgement;Awareness;Problem solving  Orientation Level Disoriented to;Time ("October 22")  Current Attention Level Sustained  Memory Decreased recall of precautions;Decreased short-term memory   Following Commands Follows one step commands consistently  Safety/Judgement Decreased awareness of safety;Decreased awareness of deficits  Awareness Emergent  Problem Solving Slow processing  General Comments improved cognition however not to baseline. Joking with therapists appropriately  Upper Extremity Assessment  Upper Extremity Assessment Generalized weakness  Lower Extremity Assessment  Lower Extremity Assessment Defer to PT evaluation  ADL  Eating/Feeding Supervision/ safety;Set up;Sitting (modifeid diet with PMSV)  Grooming Minimal assistance;Brushing hair;Oral care (Stood at sink to brush teeth)  Lower Body Dressing Moderate assistance;Bed level  Lower Body Dressing Details (indicate cue type and reason) able to donn socks using figure four position in bed  Functional mobility during ADLs +2 for physical assistance;Moderate assistance;Rolling walker;Cueing for safety (+2 for ambulation; R bias)  General ADL Comments increased SOB howver able to stadn at sink for 4-5 minutes for oral care  Bed Mobility  Overal bed mobility Needs Assistance  Bed Mobility Supine to Sit  Supine to sit Min guard;HOB elevated  General bed mobility comments Heavy use of rails with VC for problem solving  Balance  Overall balance assessment Needs assistance  Sitting balance-Leahy Scale Fair  Sitting balance - Comments R bias; poor midline orientation  Standing balance-Leahy Scale Poor  Standing balance comment reliant on external support; R bias  Vision- Assessment  Additional Comments appears improved; decreased visual attention; no longer complaining of double vision  Transfers  Overall transfer level Needs assistance  Equipment used Rolling walker (2 wheeled)  Transfers Sit to/from Stand  Sit to Stand +2 safety/equipment;Min assist (progressed to min guard A)  General transfer comment VC for hand placement; sequencing  General Comments  General comments (skin integrity, edema, etc.) Pt  worked with therapists to brush her hair. Detangler sent back at later time for NT to work with pt. Pt given adult coloring book adn word search to work  on grip adn pinch strengthening in addition to problem sovling and attending to tasks  General Exercises - Upper Extremity  Shoulder Flexion Strengthening;10 reps;Seated;AROM;Both  OT - End of Session  Equipment Utilized During Treatment Gait belt;Rolling walker;Oxygen (35% FiO2; TC)  Activity Tolerance Patient tolerated treatment well  Patient left in chair;with call bell/phone within reach;with chair alarm set  Nurse Communication Mobility status  OT Assessment/Plan  OT Plan Discharge plan remains appropriate  OT Visit Diagnosis Unsteadiness on feet (R26.81);Other abnormalities of gait and mobility (R26.89);Muscle weakness (generalized) (M62.81);Low vision, both eyes (H54.2);Other symptoms and signs involving the nervous system (R29.898);Other symptoms and signs involving cognitive function;Pain  Pain - part of body  (B heel cordswith standing)  OT Frequency (ACUTE ONLY) Min 2X/week  Recommendations for Other Services Rehab consult  Follow Up Recommendations CIR;Supervision/Assistance - 24 hour  OT Equipment 3 in 1 bedside commode;Other (comment)  AM-PAC OT "6 Clicks" Daily Activity Outcome Measure (Version 2)  Help from another person eating meals? 2 (thickened liquids)  Help from another person taking care of personal grooming? 3  Help from another person toileting, which includes using toliet, bedpan, or urinal? 2  Help from another person bathing (including washing, rinsing, drying)? 2  Help from another person to put on and taking off regular upper body clothing? 2  Help from another person to put on and taking off regular lower body clothing? 2  6 Click Score 13  OT Goal Progression  Progress towards OT goals Progressing toward goals  Acute Rehab OT Goals  Patient Stated Goal to comb out her hair  OT Goal Formulation With  patient  Time For Goal Achievement 03/10/20  Potential to Achieve Goals Good  ADL Goals  Pt Will Perform Grooming with set-up;sitting;with supervision  Pt Will Perform Upper Body Bathing with set-up;with supervision;sitting  Pt Will Perform Lower Body Bathing with min assist;bed level  Pt Will Transfer to Toilet with min assist;stand pivot transfer;bedside commode  Additional ADL Goal #1 Pt will use partial occlusion as needed with min vc to compensate for diplopia to increase functional vision  Additional ADL Goal #2 Pt will participate in 20 min ADL task/mobility while maintaining SpO2 above 90 using compensatory strateiges and energy conservation techniques  Additional ADL Goal #3 Pt will demonstrate anticipatory awareness during ADL tasks with min vc  OT Time Calculation  OT Start Time (ACUTE ONLY) 1107  OT Stop Time (ACUTE ONLY) 1145  OT Time Calculation (min) 38 min  OT General Charges  $OT Visit 1 Visit  OT Treatments  $Self Care/Home Management  23-37 mins  Maurie Boettcher, OT/L   Acute OT Clinical Specialist [place] Pager [phone] Office [phone]

## 2020-02-29 ENCOUNTER — Inpatient Hospital Stay (HOSPITAL_COMMUNITY): Payer: Medicaid Other

## 2020-02-29 DIAGNOSIS — D649 Anemia, unspecified: Secondary | ICD-10-CM | POA: Diagnosis not present

## 2020-02-29 DIAGNOSIS — J9601 Acute respiratory failure with hypoxia: Secondary | ICD-10-CM | POA: Diagnosis not present

## 2020-02-29 DIAGNOSIS — R748 Abnormal levels of other serum enzymes: Secondary | ICD-10-CM | POA: Diagnosis not present

## 2020-02-29 DIAGNOSIS — U071 COVID-19: Secondary | ICD-10-CM | POA: Diagnosis not present

## 2020-02-29 DIAGNOSIS — E237 Disorder of pituitary gland, unspecified: Secondary | ICD-10-CM | POA: Diagnosis not present

## 2020-02-29 DIAGNOSIS — Z4682 Encounter for fitting and adjustment of non-vascular catheter: Secondary | ICD-10-CM | POA: Diagnosis not present

## 2020-02-29 DIAGNOSIS — G9341 Metabolic encephalopathy: Secondary | ICD-10-CM | POA: Diagnosis not present

## 2020-02-29 LAB — COMPREHENSIVE METABOLIC PANEL
ALT: 109 U/L — ABNORMAL HIGH (ref 0–44)
AST: 63 U/L — ABNORMAL HIGH (ref 15–41)
Albumin: 3 g/dL — ABNORMAL LOW (ref 3.5–5.0)
Alkaline Phosphatase: 94 U/L (ref 38–126)
Anion gap: 13 (ref 5–15)
BUN: 28 mg/dL — ABNORMAL HIGH (ref 6–20)
CO2: 28 mmol/L (ref 22–32)
Calcium: 8.9 mg/dL (ref 8.9–10.3)
Chloride: 95 mmol/L — ABNORMAL LOW (ref 98–111)
Creatinine, Ser: 0.75 mg/dL (ref 0.44–1.00)
GFR, Estimated: >60 mL/min (ref >60)
Glucose, Bld: 105 mg/dL — ABNORMAL HIGH (ref 70–99)
Potassium: 3.5 mmol/L (ref 3.5–5.1)
Sodium: 136 mmol/L (ref 135–145)
Total Bilirubin: 1 mg/dL (ref 0.3–1.2)
Total Protein: 7.4 g/dL (ref 6.5–8.1)

## 2020-02-29 LAB — GLUCOSE, CAPILLARY
Glucose-Capillary: 107 mg/dL — ABNORMAL HIGH (ref 70–99)
Glucose-Capillary: 123 mg/dL — ABNORMAL HIGH (ref 70–99)
Glucose-Capillary: 126 mg/dL — ABNORMAL HIGH (ref 70–99)
Glucose-Capillary: 91 mg/dL (ref 70–99)
Glucose-Capillary: 94 mg/dL (ref 70–99)
Glucose-Capillary: 97 mg/dL (ref 70–99)
Glucose-Capillary: 97 mg/dL (ref 70–99)

## 2020-02-29 LAB — CBC
HCT: 33.1 % — ABNORMAL LOW (ref 36.0–46.0)
Hemoglobin: 10.5 g/dL — ABNORMAL LOW (ref 12.0–15.0)
MCH: 30 pg (ref 26.0–34.0)
MCHC: 31.7 g/dL (ref 30.0–36.0)
MCV: 94.6 fL (ref 80.0–100.0)
Platelets: 223 10*3/uL (ref 150–400)
RBC: 3.5 MIL/uL — ABNORMAL LOW (ref 3.87–5.11)
RDW: 13.7 % (ref 11.5–15.5)
WBC: 8.2 10*3/uL (ref 4.0–10.5)
nRBC: 0 % (ref 0.0–0.2)

## 2020-02-29 LAB — PROLACTIN: Prolactin: 115 ng/mL — ABNORMAL HIGH (ref 4.8–23.3)

## 2020-02-29 LAB — ACTH: C206 ACTH: 21.4 pg/mL (ref 7.2–63.3)

## 2020-02-29 LAB — LUTEINIZING HORMONE: LH: 0.6 m[IU]/mL

## 2020-02-29 LAB — INSULIN-LIKE GROWTH FACTOR: Somatomedin C: 121 ng/mL (ref 79–259)

## 2020-02-29 MED ORDER — VITAL 1.5 CAL PO LIQD
1000.0000 mL | ORAL | Status: DC
  Administered 2020-02-29 – 2020-03-03 (×4): 1000 mL
  Filled 2020-02-29 (×5): qty 1000

## 2020-02-29 MED ORDER — ADULT MULTIVITAMIN W/MINERALS CH
1.0000 | ORAL_TABLET | Freq: Every day | ORAL | Status: DC
  Administered 2020-03-01 – 2020-03-05 (×4): 1 via ORAL
  Filled 2020-02-29 (×5): qty 1

## 2020-02-29 MED ORDER — HYDROMORPHONE HCL 2 MG PO TABS
2.0000 mg | ORAL_TABLET | Freq: Four times a day (QID) | ORAL | Status: DC
  Administered 2020-02-29 – 2020-03-01 (×4): 2 mg via ORAL
  Filled 2020-02-29 (×4): qty 1

## 2020-02-29 MED ORDER — ENSURE ENLIVE PO LIQD
237.0000 mL | Freq: Two times a day (BID) | ORAL | Status: DC
  Administered 2020-03-01: 237 mL via ORAL

## 2020-02-29 MED ORDER — DOCUSATE SODIUM 50 MG/5ML PO LIQD: 100.0000 mg | Freq: Every day | ORAL | Status: DC | PRN

## 2020-02-29 MED ORDER — GUAIFENESIN 100 MG/5ML PO SOLN
5.0000 mL | ORAL | Status: DC | PRN
  Administered 2020-02-29 – 2020-03-04 (×10): 100 mg via ORAL
  Filled 2020-02-29 (×10): qty 5

## 2020-02-29 MED ORDER — RISPERIDONE 3 MG PO TABS
3.0000 mg | ORAL_TABLET | Freq: Every day | ORAL | Status: DC
  Administered 2020-02-29 – 2020-03-02 (×3): 3 mg via ORAL
  Filled 2020-02-29 (×3): qty 1

## 2020-02-29 MED ORDER — THIAMINE HCL 100 MG PO TABS
100.0000 mg | ORAL_TABLET | Freq: Every day | ORAL | Status: DC
  Administered 2020-03-01 – 2020-03-05 (×5): 100 mg via ORAL
  Filled 2020-02-29 (×5): qty 1

## 2020-02-29 MED ORDER — METHADONE HCL 10 MG PO TABS
20.0000 mg | ORAL_TABLET | Freq: Two times a day (BID) | ORAL | Status: DC
  Administered 2020-02-29 – 2020-03-01 (×2): 20 mg via ORAL
  Filled 2020-02-29 (×2): qty 2

## 2020-02-29 MED ORDER — ACETAMINOPHEN 325 MG PO TABS
650.0000 mg | ORAL_TABLET | Freq: Four times a day (QID) | ORAL | Status: DC | PRN
  Administered 2020-03-03 – 2020-03-04 (×3): 650 mg via ORAL
  Filled 2020-02-29 (×3): qty 2

## 2020-02-29 MED ORDER — CLONAZEPAM 0.5 MG PO TABS
2.0000 mg | ORAL_TABLET | Freq: Two times a day (BID) | ORAL | Status: DC
  Administered 2020-02-29 – 2020-03-01 (×3): 2 mg via ORAL
  Filled 2020-02-29 (×4): qty 4

## 2020-02-29 MED ORDER — POTASSIUM CHLORIDE 20 MEQ PO PACK
40.0000 meq | PACK | Freq: Once | ORAL | Status: AC
  Administered 2020-02-29: 40 meq via ORAL
  Filled 2020-02-29: qty 2

## 2020-02-29 MED ORDER — PANTOPRAZOLE SODIUM 40 MG PO TBEC
40.0000 mg | DELAYED_RELEASE_TABLET | Freq: Every day | ORAL | Status: DC
  Administered 2020-02-29 – 2020-03-01 (×2): 40 mg via ORAL
  Filled 2020-02-29 (×2): qty 1

## 2020-02-29 MED ORDER — MELATONIN 3 MG PO TABS
3.0000 mg | ORAL_TABLET | Freq: Every day | ORAL | Status: DC
  Administered 2020-02-29 – 2020-03-04 (×5): 3 mg via ORAL
  Filled 2020-02-29 (×5): qty 1

## 2020-02-29 MED ORDER — POLYETHYLENE GLYCOL 3350 17 G PO PACK
17.0000 g | PACK | Freq: Every day | ORAL | Status: DC
  Administered 2020-02-29 – 2020-03-01 (×2): 17 g via ORAL
  Filled 2020-02-29 (×3): qty 1

## 2020-02-29 NOTE — Progress Notes (Signed)
PROGRESS NOTE                                                                             PROGRESS NOTE                                                                                                                                                                                                             Patient Demographics:    Belinda [name], is a 40 y.o. female, DOB - 03-12-1980, UXN:[id number]  Outpatient Primary MD for the patient is Kathyrn Drown, MD    LOS - 23  Admit date - 02/06/2020    Chief Complaint  Patient presents with  . Altered Mental Status       Brief Narrative    40 year old white female, only medical history is obesity.  Presented to [facility] ER 12/15 w/ ARDS and acute metabolic encephalopathy 2/2 COVID.  Patient was admitted to the ICU.  She had to be intubated.  She was given treatment for COVID-19.  There was also concern for withdrawal since patient does have a history of opioid dependence.  She apparently uses Suboxone which is obtained illegally.  Patient was subsequently started on methadone and scheduled hydromorphone by critical care medicine.  Subsequently underwent tracheostomy placement.  Weaned off of ventilator..  Remains on tube feedings.      Subjective:   Patient has difficulty communicating due to tracheostomy.  Complains of pain in her throat area from the tube.  Otherwise feels well.  No complaints offered.  Discussed with nursing staff.  Continues to have issues with clogging of the feeding tube.   Assessment  & Plan :   Acute hypoxic respiratory failure/ARDS/status post tracheostomy  Patient has history of asthma at baseline.  Was admitted to the intensive care unit initially.  Was placed on the ventilator.  Subsequently underwent tracheostomy on 12/28.  Has been weaned off of ventilator.  Tolerating trach collar.  Patient has completed course of antibacterials.  Tracheostomy care per  pulmonology.  Pneumonia due to COVID-19  Patient is stable from a COVID-19 standpoint. She is status post treatment with Remdesivir and steroids. She also completed course of baricitinib.  Covid isolation discontinued from 02/28/2020.   Klebsiella urinary tract infection with sepsis syndrome with or without aspiration pneumonia  - unasyn ended 02/11/20 - Staph NOS VAP on 12/26 - Resp Cx MSSA on respiratory culture, complete cefazolin 02/23/18/2022 Remains stable from an infectious disease standpoint. Currently not on any antibacterials.  Acute metabolic encephalopathy Encephalopathy is in the setting of sepsis, drug abuse and Covid infection.  Started on Risperdal with improvement.  Patient also noted to be on clonazepam.  History of opioid dependence Apparently uses alprazolam on a chronic basis. Has history of opioid overdose in 2018.  As per her father she has been illegally obtaining Suboxone and has been consuming that at home.  Patient was started on scheduled methadone on 12/25 by PCCM. This was mainly as patient was quite encephalopathic and there was concern that she might be going through opioid withdrawal. Noted to be on scheduled hydromorphone as well. We'll slowly start weaning her off of these medications.  Volume overload ECHO normal 02/12/20. Patient on daily furosemide.  Monitor ins and outs daily weights.  Will replace potassium today.  Hypophosphatemia Replete as needed  Anemia of critical illness Hemoglobin stable.  No evidence of overt bleeding. Anemia panel reviewed. Folate was 18.9, B12 590, ferritin 630.  Mild transaminitis Likely due to acute illness and COVID-19. Stable for the most part. Hepatitis panel is negative.  Nutrition/dysphagia Speech therapy is following.  Currently on a dysphagia one diet.  Oral intake remains poor.  Issues with clogging of feeding tube.  Nursing staff to contact the feeding tube team.    Prediabetes with  Hyperglycemia HbA1c 5.8.  Elevated CBGs due to tube feedings.  Continue with SSI and Levemir as long as she is on tube feedings.   Hypernatrmia Improved after she was started on free water.  Concern for pituitary lesion This was incidentally noted on MRI brain.  Will need a dedicated pituitary protocol MRI when patient is more stable.  Thyroid function tests were normal: TSH 3.45 and free T4 1.03.   Luteinizing hormone level noted to be 0.6.  Difficult to clinically interpret this level at this time.  Will need to determine if patient has had normal menstruation. Prolactin level noted to be 115.  Normal range is 4.8-23.3.  Patient however noted to be on Risperdal which can increase the level of prolactin.   The following tests are still pending: insulin like growth factor, plasma ACTH level.   Patient will eventually need to be seen by endocrinology.   DVT prophylaxis: Lovenox CODE STATUS: Full code Family communication: Patient's father was updated yesterday. Disposition: Will likely need to go to rehabilitation when medically stable.  Status is: Inpatient  Remains inpatient appropriate because:IV treatments appropriate due to intensity of illness or inability to take PO and Inpatient level of care appropriate due to severity of illness   Dispo: The patient is from: Home              Anticipated d/c is to: CIR              Anticipated d/c date is: > 3 days              Patient currently is not medically stable to d/c.      Consults  :  PCCM, Neurology  Procedures  :  Trach     Inpatient Medications  Scheduled Meds: . vitamin C  500 mg Per Tube Daily  . chlorhexidine gluconate (MEDLINE KIT)  15 mL Mouth Rinse BID  . Chlorhexidine Gluconate Cloth  6 each Topical Daily  . clonazePAM  2.5 mg Per Tube BID  . enoxaparin (LOVENOX) injection  0.5 mg/kg Subcutaneous Q24H  . feeding supplement (PROSource TF)  45 mL Per Tube TID  . feeding supplement (VITAL 1.5 CAL)  1,000 mL  Per Tube Q24H  . free water  200 mL Per Tube Q6H  . furosemide  20 mg Intravenous Daily  . HYDROmorphone  2 mg Per Tube Q6H  . insulin aspart  0-20 Units Subcutaneous Q4H  . insulin detemir  10 Units Subcutaneous BID  . mouth rinse  15 mL Mouth Rinse 10 times per day  . melatonin  3 mg Per Tube QHS  . methadone  20 mg Per Tube Q12H  . multivitamin with minerals  1 tablet Per Tube Daily  . pantoprazole sodium  40 mg Per Tube Daily  . potassium chloride  20 mEq Per Tube Daily  . risperiDONE  3 mg Per Tube QHS  . thiamine injection  100 mg Intravenous Daily  . zinc sulfate  220 mg Per Tube Daily   Continuous Infusions: . sodium chloride Stopped (02/25/20 0849)  . dextrose 50 mL/hr at 02/29/20 0146  . lactated ringers 10 mL/hr at 02/08/20 1118   PRN Meds:.sodium chloride, acetaminophen, docusate, fentaNYL (SUBLIMAZE) injection, ipratropium-albuterol, LORazepam, ondansetron (ZOFRAN) IV, polyethylene glycol, Resource ThickenUp Clear  Antibiotics  :    Anti-infectives (From admission, onward)   Start     Dose/Rate Route Frequency Ordered Stop   02/18/20 1200  ceFAZolin (ANCEF) IVPB 2g/100 mL premix        2 g 200 mL/hr over 30 Minutes Intravenous Every 8 hours 02/18/20 1104 02/23/20 2044   02/17/20 2000  vancomycin (VANCOCIN) IVPB 1000 mg/200 mL premix  Status:  Discontinued        1,000 mg 200 mL/hr over 60 Minutes Intravenous Every 8 hours 02/17/20 1146 02/18/20 1104   02/17/20 1345  vancomycin (VANCOREADY) IVPB 2000 mg/400 mL        2,000 mg 200 mL/hr over 120 Minutes Intravenous  Once 02/17/20 1257 02/17/20 1752   02/17/20 1245  vancomycin (VANCOCIN) 2,000 mg in sodium chloride 0.9 % 500 mL IVPB  Status:  Discontinued        2,000 mg 250 mL/hr over 120 Minutes Intravenous  Once 02/17/20 1146 02/17/20 1445   02/07/20 1600  Ampicillin-Sulbactam (UNASYN) 3 g in sodium chloride 0.9 % 100 mL IVPB        3 g 200 mL/hr over 30 Minutes Intravenous Every 6 hours 02/07/20 1513 02/11/20  2145   02/07/20 1000  remdesivir 100 mg in sodium chloride 0.9 % 100 mL IVPB        100 mg 200 mL/hr over 30 Minutes Intravenous Daily 02/06/20 2132 02/10/20 1103   02/07/20 0800  vancomycin (VANCOCIN) IVPB 750 mg/150 ml premix  Status:  Discontinued        750 mg 150 mL/hr over 60 Minutes Intravenous Every 12 hours 02/06/20 2023 02/07/20 1510   02/07/20 0400  ceFEPIme (MAXIPIME) 2 g in sodium chloride 0.9 % 100 mL IVPB  Status:  Discontinued        2 g 200 mL/hr over 30 Minutes Intravenous Every 8 hours 02/06/20 2023 02/07/20 1510   02/06/20 2145  remdesivir 100  mg in sodium chloride 0.9 % 100 mL IVPB        100 mg 200 mL/hr over 30 Minutes Intravenous Every 30 min 02/06/20 2132 02/06/20 2305   02/06/20 1915  vancomycin (VANCOREADY) IVPB 2000 mg/400 mL        2,000 mg 200 mL/hr over 120 Minutes Intravenous  Once 02/06/20 1907 02/06/20 2250   02/06/20 1845  ceFEPIme (MAXIPIME) 2 g in sodium chloride 0.9 % 100 mL IVPB        2 g 200 mL/hr over 30 Minutes Intravenous  Once 02/06/20 1834 02/06/20 2056   02/06/20 1845  metroNIDAZOLE (FLAGYL) IVPB 500 mg        500 mg 100 mL/hr over 60 Minutes Intravenous  Once 02/06/20 1834 02/06/20 2204   02/06/20 1845  vancomycin (VANCOCIN) IVPB 1000 mg/200 mL premix  Status:  Discontinued        1,000 mg 200 mL/hr over 60 Minutes Intravenous  Once 02/06/20 1834 02/06/20 1907       Bonnielee Haff M.D on 02/29/2020 at 10:06 AM  To page go to [email or web address]   [facility] -  Office  [phone]     Objective:   Vitals:   02/29/20 0416 02/29/20 0638 02/29/20 0800 02/29/20 0837  BP: 94/65  95/66   Pulse: 94 98 (!) 103 (!) 103  Resp: _0 Temp: 98.9 F (37.2 C)     TempSrc: Oral     SpO2: 95%  95% 95%  Weight:  93.9 kg    Height:        Wt Readings from Last 3 Encounters:  02/29/20 93.9 kg     Intake/Output Summary (Last 24 hours) at 02/29/2020 1006 Last data filed at 02/29/2020 0900 Gross per 24 hour  Intake 647.17 ml   Output 600 ml  Net 47.17 ml     Physical Exam  General appearance: Awake alert.  In no distress Tracheostomy and NG feeding tube noted Resp: Mildly tachypneic at rest.  Coarse breath sounds with crackles bilateral bases.  No wheezing or rhonchi. Cardio: S1-S2 is tachycardic regular.  No S3-S4.  No rubs murmurs or bruit GI: Abdomen is soft.  Nontender nondistended.  Bowel sounds are present normal.  No masses organomegaly Extremities: No edema.  Neurologic: No focal neurological deficits.       Data Review:    CBC Recent Labs  Lab 02/23/20 0603 02/26/20 0400 02/27/20 0350 02/28/20 0614 02/29/20 0029  WBC 9.8 8.6 7.9 7.7 8.2  HGB 10.2* 11.0* 11.2* 10.6* 10.5*  HCT 31.0* 35.7* 36.5 32.6* 33.1*  PLT 295 275 251 248 223  MCV 96.0 98.1 97.6 95.9 94.6  MCH 31.6 30.2 29.9 31.2 30.0  MCHC 32.9 30.8 30.7 32.5 31.7  RDW 13.8 13.8 13.7 13.7 13.7  LYMPHSABS 1.9  --   --   --   --   MONOABS 0.8  --   --   --   --   EOSABS 0.1  --   --   --   --   BASOSABS 0.0  --   --   --   --     Recent Labs  Lab 02/23/20 0603 02/26/20 0400 02/27/20 0350 02/28/20 0614 02/29/20 0029  NA 143 146* 145 143 136  K 4.3 4.1 4.0 4.0 3.5  CL 98 104 103 98 95*  CO2 32 _1 GLUCOSE 150* 189* 97 96 105*  BUN 23* 49* 48* 39* 28*  CREATININE  0.62 0.81 0.73 0.79 0.75  CALCIUM 9.4 9.5 9.6 9.2 8.9  AST  --   --  83* 62* 63*  ALT  --   --  145* 116* 109*  ALKPHOS  --   --  104 100 94  BILITOT  --   --  0.8 0.8 1.0  ALBUMIN  --   --  3.3* 3.1* 3.0*  MG 2.6* 2.7*  --   --   --   TSH  --   --   --  3.450  --       Lab Results  Component Value Date   HGBA1C 5.8 (H) 02/08/2020    Micro Results Recent Results (from the past 240 hour(s))  Culture, respiratory (non-expectorated)     Status: None   Collection Time: 02/20/20 10:58 AM   Specimen: Tracheal Aspirate; Respiratory  Result Value Ref Range Status   Specimen Description TRACHEAL ASPIRATE  Final   Special Requests NONE   Final   Gram Stain   Final    NO WBC SEEN RARE GRAM POSITIVE COCCI IN PAIRS Performed at [facility], [address], [place], [place] [address]    Culture FEW STAPHYLOCOCCUS AUREUS  Final   Report Status 02/22/2020 FINAL  Final   Organism ID, Bacteria STAPHYLOCOCCUS AUREUS  Final      Susceptibility   Staphylococcus aureus - MIC*    CIPROFLOXACIN <=0.5 SENSITIVE Sensitive     ERYTHROMYCIN <=0.25 SENSITIVE Sensitive     GENTAMICIN <=0.5 SENSITIVE Sensitive     OXACILLIN 0.5 SENSITIVE Sensitive     TETRACYCLINE <=1 SENSITIVE Sensitive     VANCOMYCIN <=0.5 SENSITIVE Sensitive     TRIMETH/SULFA <=10 SENSITIVE Sensitive     CLINDAMYCIN <=0.25 SENSITIVE Sensitive     RIFAMPIN <=0.5 SENSITIVE Sensitive     Inducible Clindamycin NEGATIVE Sensitive     * FEW STAPHYLOCOCCUS AUREUS    Radiology Reports DG Chest 1 View  Result Date: 02/08/2020 CLINICAL DATA:  OG tube placement. EXAM: CHEST  1 VIEW COMPARISON:  Chest x-ray 02/07/2020. FINDINGS: 539 hours. Bilateral airspace disease noted, left greater than right. Endotracheal tube tip is approximately 4.3 cm above the base of the carina. Left IJ central line tip overlies the innominate vein confluence. NG tube tip is positioned in the stomach with the proximal side port below the GE junction. Telemetry leads overlie the chest. IMPRESSION: NG tube tip is in the stomach with proximal side port of the tube below the GE junction. Electronically Signed   By: Misty Stanley M.D.   On: 02/08/2020 05:54   CT HEAD WO CONTRAST  Result Date: 02/18/2020 CLINICAL DATA:  Delirium. Altered mental status. EXAM: CT HEAD WITHOUT CONTRAST TECHNIQUE: Contiguous axial images were obtained from the base of the skull through the vertex without intravenous contrast. COMPARISON:  CT head without contrast 09/01/2017 at [facility]. FINDINGS: Brain: No acute infarct, hemorrhage, or mass lesion is present. No significant white matter lesions are present. The  ventricles are of normal size. No significant extraaxial fluid collection is present. The brainstem and cerebellum are within normal limits. Vascular: No hyperdense vessel or unexpected calcification. Skull: Calvarium is intact. No focal lytic or blastic lesions are present. No significant extracranial soft tissue lesion is present. Sinuses/Orbits: Patient is intubated. Fluid level is present in the right maxillary sinus. Fluid is present in the nasopharynx. Fluid is present in the sphenoid sinuses and posterior ethmoid air cells bilaterally. Left nasogastric tube is noted. Minimal  mastoid fluid is present inferiorly on both sides. IMPRESSION: 1. Normal CT appearance of the brain. 2. Fluid in the paranasal sinuses and nasopharynx likely related to intubation. 3. Minimal mastoid fluid inferiorly on both sides. Electronically Signed   By: San Morelle M.D.   On: 02/18/2020 02:16   CT Head Wo Contrast  Result Date: 02/06/2020 CLINICAL DATA:  Altered mental status.  Recent tooth extraction. EXAM: CT HEAD WITHOUT CONTRAST TECHNIQUE: Contiguous axial images were obtained from the base of the skull through the vertex without intravenous contrast. COMPARISON:  Head CT 09/01/2017 FINDINGS: Brain: No intracranial hemorrhage, mass effect, or midline shift. No hydrocephalus. The basilar cisterns are patent. No evidence of territorial infarct or acute ischemia. No extra-axial or intracranial fluid collection. Vascular: No hyperdense vessel or unexpected calcification. Skull: No fracture or focal lesion. Sinuses/Orbits: Mucosal thickening of the right greater than left maxillary sinus. Scattered mucosal thickening throughout ethmoid air cells. Patient appears edentulous of upper teeth, presumed prior periapical lucency in the right upper molars, contiguous with the right maxillary sinus. No acute orbital abnormality. Mastoid air cells are clear. Other: None. IMPRESSION: 1. No acute intracranial abnormality. 2.  Paranasal sinus disease. 3. Patient appears edentulous of upper teeth, presumed prior periapical lucency in the right upper molars, contiguous with the right maxillary sinus. CLINICAL DATA:  Altered mental status. Recent tooth extraction. EXAM: CT HEAD WITHOUT CONTRAST TECHNIQUE: Contiguous axial images were obtained from the base of the skull through the vertex without intravenous contrast. COMPARISON:  Head CT 09/01/2017 FINDINGS: Brain: No intracranial hemorrhage, mass effect, or midline shift. No hydrocephalus. The basilar cisterns are patent. No evidence of territorial infarct or acute ischemia. No extra-axial or intracranial fluid collection. Vascular: No hyperdense vessel or unexpected calcification. Skull: No fracture or focal lesion. Sinuses/Orbits: Mucosal thickening of the right greater than left maxillary sinus. Scattered mucosal thickening throughout ethmoid air cells. Patient appears edentulous of upper teeth, presumed prior periapical lucency in the right upper molars, contiguous with the right maxillary sinus. No acute orbital abnormality. Mastoid air cells are clear. Other: None. IMPRESSION: 1. No acute intracranial abnormality. 2. Paranasal sinus mucosal thickening. 3. Patient appears edentulous of upper teeth, presumed prior periodontal disease in the right upper molars, contiguous with the right maxillary sinus. Electronically Signed   By: Keith Rake M.D.   On: 02/06/2020 22:18   MR BRAIN WO CONTRAST  Result Date: 02/25/2020 CLINICAL DATA:  Neuro deficit, acute, stroke suspected. Additional history provided: COVID positive. EXAM: MRI HEAD WITHOUT CONTRAST TECHNIQUE: Multiplanar, multiecho pulse sequences of the brain and surrounding structures were obtained without intravenous contrast. COMPARISON:  Noncontrast head CT 02/25/2020. FINDINGS: Brain: Intermittently motion degraded exam. Most notably, there is moderate motion degradation of the axial T2/FLAIR sequence. Small focus of chronic  cortical encephalomalacia within the anterolateral right frontal lobe (for instance as seen on series 7, images 14-16). 2 x 10 mm (AP x TV) T1 hyperintense lesion within the mid to posterior pituitary gland (for instance as seen on series 10, image 36). No focal parenchymal signal abnormality is identified elsewhere. There is no acute infarct. No extra-axial fluid collection. No midline shift. Vascular: Expected proximal arterial flow voids. Skull and upper cervical spine: No focal marrow lesion. Sinuses/Orbits: Visualized orbits show no acute finding. Paranasal sinus disease. Most notably, there is extensive partial opacification of the bilateral sphenoid sinuses and moderate right maxillary sinus mucosal thickening. Other: Small bilateral mastoid effusions. IMPRESSION: 1. Intermittently motion degraded exam. 2. No evidence of acute infarct.  3. Small focus of chronic cortical encephalomalacia within the anterolateral right frontal lobe. 4. 2 x 10 mm T1 hyperintense lesion within the mid-to-posterior pituitary gland. 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 However, a cystic adenoma or adenoma with prior hemorrhage cannot be excluded. Correlate with relevant laboratory values. A pituitary protocol brain MRI may be obtained for further characterization, if clinically warranted. 5. Otherwise unremarkable non-contrast MRI appearance of the brain. 6. Paranasal sinus disease, most notably severe bilateral sphenoid sinusitis. 7. Small bilateral mastoid effusions. Electronically Signed   By: Kellie Simmering DO   On: 02/25/2020 17:45   DG Chest Port 1 View  Result Date: 02/20/2020 CLINICAL DATA:  40 year old female COVID-26. EXAM: PORTABLE CHEST 1 VIEW COMPARISON:  Portable chest 02/19/2020 and earlier. FINDINGS: Portable AP semi upright view at 0448 hours. Stable tracheostomy tube, left IJ approach central line and visible enteric tube. Larger lung volumes and improved ventilation. Largely  regressed left upper lung opacity since yesterday. Residual confluent bibasilar opacity, including suspected left lower lobe collapse or consolidation. No pneumothorax. No definite pleural effusion. No acute osseous abnormality identified. Paucity of bowel gas in the upper abdomen. IMPRESSION: 1. Improved bilateral ventilation with residual confluent bibasilar opacity. 2. No new cardiopulmonary abnormality identified. 3.  Stable lines and tubes. Electronically Signed   By: Genevie Ann M.D.   On: 02/20/2020 07:03   DG Chest Port 1 View  Result Date: 02/19/2020 CLINICAL DATA:  Post tracheostomy, COVID positive EXAM: PORTABLE CHEST 1 VIEW COMPARISON:  Earlier same day FINDINGS: Tracheostomy device is present approximately 5 cm above the carina. Left IJ central line and enteric tube are again identified. Persistent bilateral pulmonary opacities similar to the prior study. Probable small pleural effusions. No pneumothorax. Similar cardiomediastinal contours. IMPRESSION: New tracheostomy device. Otherwise stable lines and tubes. Similar bilateral pulmonary opacities. Electronically Signed   By: Macy Mis M.D.   On: 02/19/2020 16:48   DG Chest Port 1 View  Result Date: 02/19/2020 CLINICAL DATA:  Hypoxia.  Respiratory failure.  COVID-19. EXAM: PORTABLE CHEST 1 VIEW COMPARISON:  02/17/2020. FINDINGS: Endotracheal tube, feeding tube, left IJ line in stable position. Cardiomegaly with pulmonary venous congestion. Bilateral interstitial infiltrates again noted. Bibasilar atelectasis. Small bilateral pleural effusions again noted. No pneumothorax. IMPRESSION: 1. Lines and tubes in stable position. 2. Cardiomegaly with pulmonary venous congestion. 3. Bilateral interstitial infiltrates again noted. Bibasilar atelectasis. Small bilateral pleural effusions again noted. Electronically Signed   By: Marcello Moores  Register   On: 02/19/2020 05:19   DG CHEST PORT 1 VIEW  Result Date: 02/17/2020 CLINICAL DATA:  Intubation EXAM:  PORTABLE CHEST 1 VIEW COMPARISON:  02/16/2020 FINDINGS: No significant interval change in AP portable chest radiograph, with diffuse bilateral interstitial heterogeneous airspace opacity, most conspicuous at the lung bases. Mild cardiomegaly. Support apparatus is unchanged including endotracheal tube, esophagogastric tube, and left neck vascular catheter. IMPRESSION: 1. No significant interval change in AP portable chest radiograph, with diffuse bilateral interstitial heterogeneous airspace opacity, most conspicuous at the lung bases. Findings are consistent with multifocal infection, edema, and/or ARDS. 2.  Unchanged support apparatus. Electronically Signed   By: Eddie Candle M.D.   On: 02/17/2020 11:26   DG CHEST PORT 1 VIEW  Result Date: 02/16/2020 CLINICAL DATA:  Altered mental status EXAM: PORTABLE CHEST 1 VIEW COMPARISON:  Yesterday FINDINGS: Endotracheal tube with tip halfway between the clavicular heads and carina. The feeding tube at least reaches the diaphragm. Left IJ line with tip at the brachiocephalic  SVC confluence. Extensive bilateral pneumonia. Cardiomegaly. No visible air leak. IMPRESSION: Stable hardware positioning and extensive pneumonia. Electronically Signed   By: Monte Fantasia M.D.   On: 02/16/2020 07:08   DG CHEST PORT 1 VIEW  Result Date: 02/15/2020 CLINICAL DATA:  COVID positive.  Ventilator dependence. EXAM: PORTABLE CHEST 1 VIEW COMPARISON:  02/14/2020 FINDINGS: 0531 hours. Endotracheal tube tip is 2.7 cm above the base of the carina. A feeding tube passes into the stomach although the distal tip position is not included on the film. Left IJ central line tip overlies the innominate vein confluence. Stable asymmetric elevation right hemidiaphragm. Relatively diffuse bilateral airspace disease again noted with mid and lower lung predominance. The cardio pericardial silhouette is enlarged. Telemetry leads overlie the chest. IMPRESSION: No substantial interval change in exam.  Electronically Signed   By: Misty Stanley M.D.   On: 02/15/2020 09:02   DG CHEST PORT 1 VIEW  Result Date: 02/14/2020 CLINICAL DATA:  ETT EXAM: PORTABLE CHEST 1 VIEW COMPARISON:  Radiograph 02/13/2020 FINDINGS: *Endotracheal tube tip low in the trachea, 2 cm from the carina. Consider retraction 1-2 cm to the mid trachea. *Transesophageal tube tip terminates near the region of the gastric antrum/duodenal bulb. *Left upper IJ approach central venous catheter tip terminates near the left brachiocephalic-caval confluence. Persistent diffuse heterogeneous opacities throughout both lungs in a mid to lower lung predominance, not significantly changed from 1 day prior. Cardiomegaly is stable. Indistinct vascularity. No pneumothorax. Obscuration of the hemidiaphragms may suggest some layering effusion. No acute osseous or soft tissue abnormality. Telemetry leads overlie the chest. IMPRESSION: 1. Endotracheal tube tip low in the trachea, 2 cm from the carina. Consider retraction 1-2 cm to the mid trachea. 2. Stable diffuse heterogeneous opacities throughout both lungs in a mid to lower lung predominance. Could reflect a combination of edema and infection with likely layering bilateral effusions. These results will be called to the ordering clinician or representative by the Radiologist Assistant, and communication documented in the PACS or [employer]. Electronically Signed   By: Lovena Le M.D.   On: 02/14/2020 05:39   DG CHEST PORT 1 VIEW  Result Date: 02/13/2020 CLINICAL DATA:  Hypoxia EXAM: PORTABLE CHEST 1 VIEW COMPARISON:  February 12, 2020 FINDINGS: Endotracheal tube tip is 1.7 cm above the carina. Enteric tube tip is below the diaphragm. Central catheter tip is in the left innominate vein near the junction with the superior vena cava. No pneumothorax. There is airspace opacity in both mid and lower lung regions with small pleural effusions bilaterally. Heart is enlarged with pulmonary vascularity  normal, stable. No adenopathy. No bone lesions. IMPRESSION: Tube and catheter positions as described without pneumothorax. Persistent airspace opacity in the mid and lower lung regions with bilateral pleural effusions. Cardiomegaly. Overall appearance raises concern for a degree of congestive heart failure. There may well be superimposed pneumonia in the lung bases. Both edema and pneumonia may present concurrently. Electronically Signed   By: Lowella Grip III M.D.   On: 02/13/2020 08:03   DG CHEST PORT 1 VIEW  Result Date: 02/12/2020 CLINICAL DATA:  Endotracheal tube placement. EXAM: PORTABLE CHEST 1 VIEW COMPARISON:  02/11/2020. FINDINGS: Endotracheal tube and feeding tube in stable position. Left IJ line stable position. Cardiomegaly. Low lung volumes with bibasilar atelectasis. Diffuse bilateral pulmonary infiltrates/edema again noted. Small bilateral pleural effusions again noted. Chest is unchanged from prior exam. No pneumothorax. IMPRESSION: 1. Lines and tubes in stable position. 2. Cardiomegaly with persistent bilateral pulmonary infiltrates/edema and  small bilateral pleural effusions. 3. Low lung volumes with bibasilar atelectasis. Chest is unchanged from prior exam. Electronically Signed   By: Marcello Moores  Register   On: 02/12/2020 05:21   DG CHEST PORT 1 VIEW  Result Date: 02/11/2020 CLINICAL DATA:  Hypoxia EXAM: PORTABLE CHEST 1 VIEW COMPARISON:  February 08, 2020 FINDINGS: Endotracheal tube tip is 3.0 cm above the carina. Enteric tube tip is below the diaphragm. Central catheter tip is in the superior vena cava slightly beyond the junction with the left innominate vein. No pneumothorax. There is cardiomegaly with pulmonary venous hypertension. There is multifocal airspace opacity throughout the lungs bilaterally with equivocal pleural effusions bilaterally. No bone lesions. No adenopathy appreciable. IMPRESSION: Tube and catheter positions as described without pneumothorax. Cardiomegaly with  pulmonary vascular congestion. Small pleural effusions. Multifocal airspace opacity may represent pulmonary edema or multifocal pneumonia. Both entities may be present concurrently. Appearance overall similar to recent study. Electronically Signed   By: Lowella Grip III M.D.   On: 02/11/2020 08:51   DG Chest Port 1 View  Result Date: 02/07/2020 CLINICAL DATA:  Acute respiratory failure. Pneumonia due to COVID-19. EXAM: PORTABLE CHEST 1 VIEW COMPARISON:  Radiograph yesterday. FINDINGS: Progressive heterogeneous bilateral airspace opacities, confluent in the lower lobes. Heart size grossly normal but obscured by adjacent airspace disease. Overall low lung volumes. No evidence of pneumomediastinum or pneumothorax. Soft tissue attenuation from habitus limits assessment. IMPRESSION: Bilateral COVID pneumonia which is progressed from yesterday. Low lung volumes persist. Electronically Signed   By: Keith Rake M.D.   On: 02/07/2020 15:36   DG Chest Port 1 View  Result Date: 02/06/2020 CLINICAL DATA:  Sepsis.  Encephalopathy. EXAM: PORTABLE CHEST 1 VIEW COMPARISON:  None. FINDINGS: Multifocal airspace opacity throughout both lungs. Small pleural effusions. No pneumothorax. IMPRESSION: Multifocal bilateral airspace opacities concerning for multifocal infection. Electronically Signed   By: Ulyses Jarred M.D.   On: 02/06/2020 19:36   DG Abd Portable 1V  Result Date: 02/28/2020 CLINICAL DATA:  OG tube placement. EXAM: PORTABLE ABDOMEN - 1 VIEW COMPARISON:  None. FINDINGS: Small bore feeding tube is noted with tip overlying the duodenum versus distal stomach. No dilated bowel loops are noted within the mid-UPPER abdomen. Contrast is noted within the colon, from swallowing function study performed yesterday. IMPRESSION: Small bore feeding tube with tip overlying the duodenum/distal stomach. No dilated bowel loops identified on this exam. Electronically Signed   By: Margarette Canada M.D.   On: 02/28/2020 17:55    DG Swallowing Func-Speech Pathology  Result Date: 02/27/2020 Objective Swallowing Evaluation: Type of Study: MBS-Modified Barium Swallow Study  Patient Details Name: AKINA MAISH MRN: [id number] Date of Birth: 04/12/1980 Today's Date: 02/27/2020 Time: SLP Start Time (ACUTE ONLY): 1358 -SLP Stop Time (ACUTE ONLY): 1420 SLP Time Calculation (min) (ACUTE ONLY): 22 min Past Medical History: No past medical history on file. Past Surgical History: No past surgical history on file. HPI: Pt is a 40 year old female with medical history significant for obesity.  She presented to [facility] ED on 12/15 with 24 to 48 hours of AMS following exposure to her COVID+ son. CXR showed diffuse bilateral airspace disease, she was confused and agitated. Physical restraints applied on admission and Precedex infusion given.  She required 100% nonrebreather mask and was transferred to [facility] for further intervention. CXR 12/29: Improved bilateral ventilation with residual confluent bibasilar opacity. CT head negative. ETT 12/17-trach 12/28. Stat CT head completed on 1/3 due to increased weakness noted by PT and  was negative.  No data recorded Assessment / Plan / Recommendation CHL IP CLINICAL IMPRESSIONS 02/27/2020 Clinical Impression Pt demonstrates a moderate oropharyngeal dysphagia likely secondry to prolonged intubation with decreased glottic closure. Pt has adequate oral phase (though no upper dentition) and timely swallow, though there is silent aspiration during the swallow with thin and nectar thick liquids regardless of bolus size and despite a chin tuck. Pt was not wearing her PMSV during this exam (pt COVID positive and PMSV left in room) and Cortrak was also in place. Suspect that pt may be able to quickly upgrade to nectar when cortrak comes out and pt has PMSV in place to aid in cough/throat clear response. For now pt able to initaite a dys 2/honey thick diet. WIll f/u for tolerance. SLP Visit Diagnosis  Dysphagia, unspecified (R13.10) Attention and concentration deficit following -- Frontal lobe and executive function deficit following -- Impact on safety and function Moderate aspiration risk   CHL IP TREATMENT RECOMMENDATION 02/27/2020 Treatment Recommendations Therapy as outlined in treatment plan below   Prognosis 02/27/2020 Prognosis for Safe Diet Advancement Good Barriers to Reach Goals -- Barriers/Prognosis Comment -- CHL IP DIET RECOMMENDATION 02/27/2020 SLP Diet Recommendations Dysphagia 2 (Fine chop) solids;Honey thick liquids Liquid Administration via Cup;Straw Medication Administration Whole meds with puree Compensations Slow rate;Small sips/bites Postural Changes Seated upright at 90 degrees   CHL IP OTHER RECOMMENDATIONS 02/27/2020 Recommended Consults -- Oral Care Recommendations -- Other Recommendations Place PMSV during PO intake;Have oral suction available   CHL IP FOLLOW UP RECOMMENDATIONS 02/27/2020 Follow up Recommendations Inpatient Rehab   CHL IP FREQUENCY AND DURATION 02/27/2020 Speech Therapy Frequency (ACUTE ONLY) min 2x/week Treatment Duration 2 weeks      CHL IP ORAL PHASE 02/27/2020 Oral Phase Impaired Oral - Pudding Teaspoon -- Oral - Pudding Cup -- Oral - Honey Teaspoon WFL Oral - Honey Cup WFL Oral - Nectar Teaspoon WFL Oral - Nectar Cup WFL Oral - Nectar Straw WFL Oral - Thin Teaspoon -- Oral - Thin Cup WFL Oral - Thin Straw WFL Oral - Puree WFL Oral - Mech Soft -- Oral - Regular -- Oral - Multi-Consistency -- Oral - Pill -- Oral Phase - Comment --  CHL IP PHARYNGEAL PHASE 02/27/2020 Pharyngeal Phase Impaired Pharyngeal- Pudding Teaspoon -- Pharyngeal -- Pharyngeal- Pudding Cup -- Pharyngeal -- Pharyngeal- Honey Teaspoon -- Pharyngeal -- Pharyngeal- Honey Cup WFL Pharyngeal -- Pharyngeal- Nectar Teaspoon -- Pharyngeal -- Pharyngeal- Nectar Cup Penetration/Aspiration during swallow;Reduced airway/laryngeal closure Pharyngeal Material enters airway, passes BELOW cords without attempt by patient to  eject out (silent aspiration) Pharyngeal- Nectar Straw Penetration/Aspiration during swallow;Reduced airway/laryngeal closure Pharyngeal Material enters airway, passes BELOW cords without attempt by patient to eject out (silent aspiration) Pharyngeal- Thin Teaspoon -- Pharyngeal -- Pharyngeal- Thin Cup Penetration/Aspiration during swallow Pharyngeal Material enters airway, passes BELOW cords without attempt by patient to eject out (silent aspiration) Pharyngeal- Thin Straw Penetration/Aspiration during swallow Pharyngeal Material enters airway, passes BELOW cords without attempt by patient to eject out (silent aspiration) Pharyngeal- Puree WFL Pharyngeal -- Pharyngeal- Mechanical Soft WFL Pharyngeal -- Pharyngeal- Regular -- Pharyngeal -- Pharyngeal- Multi-consistency -- Pharyngeal -- Pharyngeal- Pill -- Pharyngeal -- Pharyngeal Comment --  No flowsheet data found. DeBlois, Katherene Ponto 02/27/2020, 2:47 PM              EEG adult  Result Date: 02/17/2020 Lora Havens, MD     02/17/2020  3:05 PM Patient Name: RHILYNN PREYER MRN: [id number] Epilepsy Attending: Lora Havens Referring Physician/Provider:  Dr Brand Males Date: 02/17/2020 Duration: 24.30 mins Patient history: 40yo F with ams in setting of sepsis and Covid. EEG to evaluate for seizure Level of alertness:  comatose AEDs during EEG study: Versed Technical aspects: This EEG study was done with scalp electrodes positioned according to the 10-20 International system of electrode placement. Electrical activity was acquired at a sampling rate of _0  and reviewed with a high frequency filter of _1  and a low frequency filter of _2 . EEG data were recorded continuously and digitally stored. Description: EEG showed continuous generalized 3 to 6 Hz theta-delta slowing admixed with an excessive amount of 15 to 18 Hz beta activity distributed symmetrically and diffusely.  Hyperventilation and photic stimulation were not performed.   ABNORMALITY  -Excessive beta, generalized -Continuous slow, generalized IMPRESSION: This study is suggestive of severe diffuse encephalopathy, nonspecific etiology but likely related to sedation. No seizures or epileptiform discharges were seen throughout the recording. Lora Havens   ECHOCARDIOGRAM COMPLETE  Result Date: 02/12/2020    ECHOCARDIOGRAM REPORT   Patient Name:   NAYAH LUKENS Date of Exam: 02/12/2020 Medical Rec #:  [id number]        Height:       63.0 in Accession #:    [id number]       Weight:       240.5 lb Date of Birth:  Apr 24, 1980        BSA:          2.091 m Patient Age:    88 years         BP:           99/70 mmHg Patient Gender: F                HR:           54 bpm. Exam Location:  Inpatient Procedure: 2D Echo, Cardiac Doppler and Color Doppler Indications:    Acute respiratory distress R06.03  History:        Patient has no prior history of Echocardiogram examinations.  Sonographer:    Clayton Lefort RDCS (AE) Referring Phys: [id number]  Sonographer Comments: Image acquisition challenging due to patient body habitus. IMPRESSIONS  1. Left ventricular ejection fraction, by estimation, is 55 to 60%. The left ventricle has normal function. The left ventricle has no regional wall motion abnormalities. Left ventricular diastolic parameters were normal.  2. Right ventricular systolic function is normal. The right ventricular size is normal. The estimated right ventricular systolic pressure is 21.9 mmHg.  3. The mitral valve is grossly normal. Trivial mitral valve regurgitation.  4. The aortic valve is tricuspid. Aortic valve regurgitation is not visualized.  5. The inferior vena cava is normal in size with greater than 50% respiratory variability, suggesting right atrial pressure of 3 mmHg. Conclusion(s)/Recommendation(s): Normal biventricular function without evidence of hemodynamically significant valvular heart disease. FINDINGS  Left Ventricle: Left ventricular ejection fraction, by  estimation, is 55 to 60%. The left ventricle has normal function. The left ventricle has no regional wall motion abnormalities. The left ventricular internal cavity size was normal in size. There is  no left ventricular hypertrophy. Left ventricular diastolic parameters were normal. Right Ventricle: The right ventricular size is normal. No increase in right ventricular wall thickness. Right ventricular systolic function is normal. The tricuspid regurgitant velocity is 2.26 m/s, and with an assumed right atrial pressure of 3 mmHg, the estimated right ventricular systolic pressure is 75.8 mmHg. Left Atrium: Left atrial size was normal  in size. Right Atrium: Right atrial size was normal in size. Pericardium: There is no evidence of pericardial effusion. Mitral Valve: The mitral valve is grossly normal. Trivial mitral valve regurgitation. Tricuspid Valve: The tricuspid valve is grossly normal. Tricuspid valve regurgitation is trivial. Aortic Valve: The aortic valve is tricuspid. Aortic valve regurgitation is not visualized. Aortic valve mean gradient measures 3.0 mmHg. Aortic valve peak gradient measures 5.8 mmHg. Aortic valve area, by VTI measures 2.60 cm. Pulmonic Valve: The pulmonic valve was grossly normal. Pulmonic valve regurgitation is trivial. Aorta: The aortic root and ascending aorta are structurally normal, with no evidence of dilitation. Venous: The inferior vena cava is normal in size with greater than 50% respiratory variability, suggesting right atrial pressure of 3 mmHg. IAS/Shunts: No atrial level shunt detected by color flow Doppler.  LEFT VENTRICLE PLAX 2D LVIDd:         5.20 cm  Diastology LVIDs:         3.80 cm  LV e' medial:    9.90 cm/s LV PW:         1.30 cm  LV E/e' medial:  7.5 LV IVS:        1.00 cm  LV e' lateral:   10.10 cm/s LVOT diam:     2.20 cm  LV E/e' lateral: 7.4 LV SV:         67 LV SV Index:   32 LVOT Area:     3.80 cm  RIGHT VENTRICLE             IVC RV Basal diam:  2.60 cm      IVC diam: 1.10 cm RV S prime:     11.10 cm/s TAPSE (M-mode): 1.9 cm LEFT ATRIUM             Index       RIGHT ATRIUM           Index LA diam:        2.90 cm 1.39 cm/m  RA Area:     14.60 cm LA Vol (A2C):   49.8 ml 23.81 ml/m RA Volume:   35.90 ml  17.17 ml/m LA Vol (A4C):   37.8 ml 18.07 ml/m LA Biplane Vol: 46.4 ml 22.19 ml/m  AORTIC VALVE AV Area (Vmax):    2.65 cm AV Area (Vmean):   2.80 cm AV Area (VTI):     2.60 cm AV Vmax:           120.00 cm/s AV Vmean:          76.100 cm/s AV VTI:            0.259 m AV Peak Grad:      5.8 mmHg AV Mean Grad:      3.0 mmHg LVOT Vmax:         83.60 cm/s LVOT Vmean:        56.100 cm/s LVOT VTI:          0.177 m LVOT/AV VTI ratio: 0.68  AORTA Ao Root diam: 3.30 cm Ao Asc diam:  2.60 cm MITRAL VALVE               TRICUSPID VALVE MV Area (PHT): 3.21 cm    TR Peak grad:   20.4 mmHg MV Decel Time: 236 msec    TR Vmax:        226.00 cm/s MV E velocity: 74.60 cm/s MV A velocity: 40.70 cm/s  SHUNTS MV E/A ratio:  1.83  Systemic VTI:  0.18 m                            Systemic Diam: 2.20 cm Lyman Bishop MD Electronically signed by Lyman Bishop MD Signature Date/Time: 02/12/2020/6:17:29 PM    Final    CT HEAD CODE STROKE WO CONTRAST`  Result Date: 02/25/2020 CLINICAL DATA:  Code stroke.  Left-sided weakness EXAM: CT HEAD WITHOUT CONTRAST TECHNIQUE: Contiguous axial images were obtained from the base of the skull through the vertex without intravenous contrast. COMPARISON:  None. FINDINGS: Brain: There is no acute intracranial hemorrhage, mass effect, or edema. Gray-white differentiation is preserved. No extra-axial collection. Ventricles and sulci are normal in size and configuration. Vascular: No hyperdense vessel. Skull: Unremarkable. Sinuses/Orbits: Nonspecific paranasal sinus opacification primarily involving right maxillary and both sphenoid sinuses. There is thinning or dehiscence of the inferior right maxillary sinus wall. Orbits are unremarkable. Other:  Minimal mastoid opacification. ASPECTS ([place] Stroke Program Early CT Score) - Ganglionic level infarction (caudate, lentiform nuclei, internal capsule, insula, M1-M3 cortex): 7 - Supraganglionic infarction (M4-M6 cortex): 3 Total score (0-10 with 10 being normal): 10 IMPRESSION: There is no acute intracranial hemorrhage or evidence of acute infarction. ASPECT score is 10. These results were communicated to Dr. Lorrin Goodell at 12:15 pm on 02/25/2020 by text page via the [facility] messaging system. Electronically Signed   By: Macy Mis M.D.   On: 02/25/2020 12:36   VAS [place] LOWER EXTREMITY VENOUS (DVT)  Result Date: 02/18/2020  Lower Venous DVT Study Indications: Covid-19, elevated D-Dimer.  Comparison Study: No prior study Performing Technologist: Sharion Dove RVS  Examination Guidelines: A complete evaluation includes B-mode imaging, spectral Doppler, color Doppler, and power Doppler as needed of all accessible portions of each vessel. Bilateral testing is considered an integral part of a complete examination. Limited examinations for reoccurring indications may be performed as noted. The reflux portion of the exam is performed with the patient in reverse Trendelenburg.  +---------+---------------+---------+-----------+----------+--------------+ RIGHT    CompressibilityPhasicitySpontaneityPropertiesThrombus Aging +---------+---------------+---------+-----------+----------+--------------+ CFV      Full           Yes      Yes                                 +---------+---------------+---------+-----------+----------+--------------+ SFJ      Full                                                        +---------+---------------+---------+-----------+----------+--------------+ FV Prox  Full                                                        +---------+---------------+---------+-----------+----------+--------------+ FV Mid   Full                                                         +---------+---------------+---------+-----------+----------+--------------+ FV DistalFull                                                        +---------+---------------+---------+-----------+----------+--------------+  PFV      Full                                                        +---------+---------------+---------+-----------+----------+--------------+ POP      Full           Yes      Yes                                 +---------+---------------+---------+-----------+----------+--------------+ PTV      Full                                                        +---------+---------------+---------+-----------+----------+--------------+ PERO     Full                                                        +---------+---------------+---------+-----------+----------+--------------+   +---------+---------------+---------+-----------+----------+--------------+ LEFT     CompressibilityPhasicitySpontaneityPropertiesThrombus Aging +---------+---------------+---------+-----------+----------+--------------+ CFV      Full           Yes      Yes                                 +---------+---------------+---------+-----------+----------+--------------+ SFJ      Full                                                        +---------+---------------+---------+-----------+----------+--------------+ FV Prox  Full                                                        +---------+---------------+---------+-----------+----------+--------------+ FV Mid   Full                                                        +---------+---------------+---------+-----------+----------+--------------+ FV DistalFull                                                        +---------+---------------+---------+-----------+----------+--------------+ PFV      Full                                                         +---------+---------------+---------+-----------+----------+--------------+  POP      Full           Yes      Yes                                 +---------+---------------+---------+-----------+----------+--------------+ PTV      Full                                                        +---------+---------------+---------+-----------+----------+--------------+ PERO     Full                                                        +---------+---------------+---------+-----------+----------+--------------+     Summary: BILATERAL: - No evidence of deep vein thrombosis seen in the lower extremities, bilaterally. -No evidence of popliteal cyst, bilaterally.   *See table(s) above for measurements and observations. Electronically signed by Ruta Hinds MD on 02/18/2020 at 5:24:02 PM.    Final

## 2020-02-29 NOTE — Progress Notes (Signed)
Inpatient Rehab Admissions Coordinator:    Met with pt. To let her know that I do not have a bed available for pt. Today. MD note states she is not medically ready for CIR.  I will open a case with pt.'s insurance today and pursue for potential admit next week.   Clemens Catholic, [place], [place] Admissions Coordinator  [phone] ([place]) [phone] (office)

## 2020-02-29 NOTE — PMR Pre-admission (Signed)
PMR Admission Coordinator Pre-Admission Assessment  Patient: Belinda [name] is an 40 y.o., female MRN: [id number] DOB: [birth year] Height: _0  (160 cm) Weight: 96.4 kg  Insurance Information HMO:     PPO:      PCP:      IPA:      80/20:      OTHER:  PRIMARY: Blue Medicaid       Policy#: [id number]      Subscriber: Pt.  CM Name: Azel     Phone#: [phone]  BMW#:[phone] Pre-Cert#: [id number]      Employer: Benefits:  Phone #: :[phone]         Name:  Eff. Date:08/23/2019     Deduct: $0      Out of Pocket Max: $0     Life Max: n/a CIR: $0 copay     SNF: $0 copay, limited by medical necessity review.  Outpatient: $3 copay  Home Health: $0 copay, limited by medical necessity review.  DME: $0 copay, limited by medical necessity review.  Providers: in network  SECONDARY: n/a  Financial Counselor: n/a      Phone#:   The "Data Collection Information Summary" for patients in Inpatient Rehabilitation Facilities with attached "Privacy Act [place] Records" was provided and verbally reviewed with: N/A  Emergency Contact Information Contact Information    Name Relation Home Work [place], [place] Father   [phone]   Charlann Noss Mother [phone]     Luan Pulling [phone]     whitworth, bret Son   [phone]      Current Medical History  Patient Admitting Diagnosis: COVID, ARDS History of Present Illness:  HPI: Belinda [name] is a 40 year old female with history of anxiety d/o, morbid obesity, GERD who was admitted on 01/27/20 via [facility] with hypoxia, confusion and delirium wht hypotension due to Covid 19 PNA. She was sedated with precedex due to agitation, intubated and treated with Covid protocol-Remdesiver, barcitinib, Solumedrol and antibiotics. Hospital course significant for issues with combativeness (required fentanyl, precedex, ketamine, versed)  as well as worsening of respiratory status due to ARDS requiring intubation. CT  head negative 12/15 and 12/27. UDS positive for benzo's and opiates. She was started on methadone 12/25 due to concerns of drug withdrawal and on 12/26 family (confirmed with patient) reported that patient had been taking her fiance's Suboxone for the past year and ( hx of accidental ibuprofen/ETOH OD in 2007--question "another substance" per mother)  Klebsiella UTI with sepsis treated with Unasyn. Neurology consulted for input on refractory delirium and encephalopathy felt to be multifactorial.  EEG done negative for seizures and showed severe diffuse encephalopathy.  She developed ARDS  With CAP v/s aspiration and underwent tracheostomy on 12/28 by Dr. Tamala Julian.  She was being weaned to [facility] and on 01/03 found to have dysconjugate gaze with question of Left> right sided weakness.  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   Mentation improving and scheduled hydromorphone and methadone being weaned off. Risperdal d/c due to concerns of tremors/jerks. Xanax resumed per home regimen but she was reporting  diarrhea with attempts to wean dilaudid and increased to 1 mg tid and methadone increased to 10 mg bid on 01/11. Fluid overload treated with  intermittent IV diuresis and now on oral lasix. Tolerating dysphagia 2 with honey liquids but continues on nocturnal tube feeds due to poor intake. PCCM following for input and recommended downsize to cuffless #6 but patient wanted to wait till today due to anxiety. Therapy has been ongoing and patient limited by decreased activity tolerance, hypoxia with activity,  weakness with flexed posture, narrow base of support with unsteadiness and LE fatigue with ambulation. CIR  recommended due to functional deficits in mobility and ADLs.  Patient's medical record from [facility] has been reviewed by the rehabilitation admission coordinator and physician.  Past Medical History  Past Medical History:  Diagnosis Date  . Anxiety disorder   . Fatty liver   . GAD (generalized anxiety disorder)    with panic attacks  . GERD (gastroesophageal reflux disease)   . Morbid obesity ([place])   . Narcotic drug use   . OCD (obsessive compulsive disorder)   . Psoriasis     Family History   family history includes High blood pressure in her brother and father; Stroke in her father.  Prior Rehab/Hospitalizations Has the patient had prior rehab or hospitalizations prior to admission? No  Has the patient had major surgery during 100 days prior to admission? Yes   Current Medications  Current Facility-Administered Medications:  .  0.9 %  sodium chloride infusion, , Intravenous, PRN, Audria Nine, DO, Paused at 02/25/20 0849 .  acetaminophen (TYLENOL) tablet 650 mg, 650 mg, Oral, Q6H PRN, Bonnielee Haff, MD, 650 mg at 03/04/20 2206 .  ALPRAZolam Duanne Moron) tablet 1 mg, 1 mg, Oral, TID PRN, Domenic Polite, MD, 1 mg at 03/05/20 0155 .  chlorhexidine gluconate (MEDLINE KIT) (PERIDEX) 0.12 % solution 15 mL, 15 mL, Mouth Rinse, BID, Mannam, Praveen, MD, 15 mL at 03/04/20 2059 .  Chlorhexidine Gluconate Cloth 2 % PADS 6 each, 6 each, Topical, Daily, Erick Colace, NP, 6 each at 03/01/20 [phone] .  clobetasol cream (TEMOVATE) 8.41 % 1 application, 1 application, Topical, BID, Domenic Polite, MD, 1 application at 32/44/01 2138 .  enoxaparin (LOVENOX) injection 47.5 mg, 0.5 mg/kg, Subcutaneous, Q24H, Icard, Bradley L, DO, 47.5 mg at 03/04/20 2118 .  feeding supplement (ENSURE ENLIVE / ENSURE PLUS) liquid 237 mL, 237 mL, Oral, BID BM, Bonnielee Haff, MD, 237 mL at 03/01/20 1020 .  feeding supplement (VITAL 1.5 CAL) liquid 780 mL, 780 mL, Per Tube, Q24H, Domenic Polite, MD, Last Rate: 65 mL/hr at 03/04/20 1846, 780 mL at 03/04/20 1846 .  furosemide (LASIX) tablet 20 mg, 20 mg, Oral, Daily, Bonnielee Haff, MD, 20 mg at 03/05/20 0929 .  guaiFENesin (ROBITUSSIN) 100 MG/5ML solution 100 mg, 5 mL, Oral, Q4H PRN, Bonnielee Haff, MD, 100 mg at 03/04/20 1710 .  HYDROmorphone (DILAUDID) tablet 1 mg, 1 mg, Oral, BID PRN, Domenic Polite, MD .  insulin aspart (novoLOG) injection 0-20 Units, 0-20 Units, Subcutaneous, Q4H, Erick Colace, NP, 3 Units at 03/05/20 0032 .  insulin detemir (LEVEMIR) injection 5 Units, 5 Units, Subcutaneous, BID, Bonnielee Haff, MD, 5 Units at 03/05/20 [phone] .  ipratropium-albuterol (DUONEB) 0.5-2.5 (3) MG/3ML nebulizer solution 3 mL, 3 mL, Nebulization, Q6H PRN, Audria Nine, DO .  lactated ringers infusion, , Intravenous, Continuous, Erick Colace, NP, Last Rate: 10 mL/hr at 02/08/20 1118, Infusion Verify at 02/08/20 1118 .  MEDLINE mouth rinse, 15 mL, Mouth Rinse, 10 times per day, Mannam, Praveen, MD, 15 mL at 03/05/20 0930 .  melatonin tablet 3 mg, 3 mg, Oral,  Paula Compton, MD, 3 mg at 03/04/20 2117 .  methadone (DOLOPHINE) tablet 10 mg, 10 mg, Oral, Q12H, Domenic Polite, MD, 10 mg at 03/05/20 0928 .  multivitamin with minerals tablet 1 tablet, 1 tablet, Oral, Daily, Bonnielee Haff, MD, 1 tablet at 03/05/20 0929 .  ondansetron (ZOFRAN) injection 4 mg, 4 mg, Intravenous, Q6H PRN, Bonnielee Haff, MD, 4 mg at 03/03/20 2153 .  pantoprazole (PROTONIX) EC tablet 40 mg, 40 mg, Oral, BID AC, Vann, Jessica U, DO, 40 mg at 03/05/20 0600 .  Resource ThickenUp Clear, , Oral, PRN, Bonnielee Haff, MD, Given at 02/27/20 2005 .  thiamine tablet 100 mg, 100 mg, Oral, Daily, Bonnielee Haff, MD, 100 mg at 03/05/20 4401  Patients Current Diet:  Diet Order            DIET DYS 2 Room service appropriate? Yes; Fluid consistency: Honey Thick  Diet effective now                 Precautions /  Restrictions Precautions Precautions: Fall Precaution Comments: trach, cortrak, watch HR Restrictions Weight Bearing Restrictions: No   Has the patient had 2 or more falls or a fall with injury in the past year? No  Prior Activity Level Community (5-7x/wk): Pt. was active in the community PTA  Prior Functional Level Self Care: Did the patient need help bathing, dressing, using the toilet or eating? Independent  Indoor Mobility: Did the patient need assistance with walking from room to room (with or without device)? Independent  Stairs: Did the patient need assistance with internal or external stairs (with or without device)? Independent  Functional Cognition: Did the patient need help planning regular tasks such as shopping or remembering to take medications? Independent  Home Assistive Devices / Equipment Home Assistive Devices/Equipment: None  Prior Device Use: Indicate devices/aids used by the patient prior to current illness, exacerbation or injury? None of the above  Current Functional Level Cognition  Overall Cognitive Status: Impaired/Different from baseline Current Attention Level: Sustained Orientation Level: Oriented X4 Following Commands: Follows one step commands consistently Safety/Judgement: Decreased awareness of safety,Decreased awareness of deficits General Comments: cognition continues to improve. good awareness of anxiety, relaxation methods coached    Extremity Assessment (includes Sensation/Coordination)  Upper Extremity Assessment: Generalized weakness RUE Deficits / Details: shoulder 2+/5 - ablet o lift against gravity through full ROM; elbow @ 3/5; wirst/hand @ 3/5. poor in-hand manipulation skills due to weakness, however able to manipulate toothette; appears stronger than L RUE Coordination: decreased fine motor,decreased gross motor LUE Deficits / Details: Pt appears to have more difficulty with in-hand manipulation skills with L hand and reports  that it "feels different"strength similar to R LUE Sensation: decreased light touch (pt reprots feels "different") LUE Coordination: decreased fine motor,decreased gross motor  Lower Extremity Assessment: Defer to PT evaluation    ADLs  Overall ADL's : Needs assistance/impaired Eating/Feeding: Supervision/ safety,Set up,Sitting (modifeid diet with PMSV) Grooming: Set up,Sitting Grooming Details (indicate cue type and reason): in reclienr Upper Body Bathing: Minimal assistance,Sitting Lower Body Bathing: Moderate assistance,Sit to/from stand Upper Body Dressing : Minimal assistance,Sitting Lower Body Dressing: Moderate assistance,Sit to/from stand Lower Body Dressing Details (indicate cue type and reason): able to donn socks using figure four position in bed Toilet Transfer: Minimal assistance,Stand-pivot Toilet Transfer Details (indicate cue type and reason): has been using BSC with RN in addition to therapy! Toileting- Clothing Manipulation and Hygiene: Moderate assistance,Sit to/from stand Functional mobility during ADLs: Minimal assistance,Rolling walker,Cueing for safety General  ADL Comments: increased time spent helping pt comb through her matted hair    Mobility  Overal bed mobility: Needs Assistance Bed Mobility: Supine to Sit Rolling: Min assist,+2 for safety/equipment Supine to sit: HOB elevated,Min assist Sit to supine: Supervision General bed mobility comments: Pt OOB in recliner at beginning and end of session    Transfers  Overall transfer level: Needs assistance Equipment used: Rolling walker (2 wheeled) Transfers: Sit to/from Stand Sit to Stand: Min assist Stand pivot transfers: Mod assist,+2 physical assistance General transfer comment: min +2 assist to stand from recliner initially, minguard for additional standing with repeated cues for hand placement    Ambulation / Gait / Stairs / Wheelchair Mobility  Ambulation/Gait Ambulation/Gait assistance: Min assist,+2  safety/equipment (close chair follow) Gait Distance (Feet): 10 Feet (1x8, 1x10) Assistive device: Rolling walker (2 wheeled) Gait Pattern/deviations: Step-to pattern,Trunk flexed,Narrow base of support General Gait Details: min A for steadying, vc for upright posture and proximity to RW, pt with increased LE fatigue with distance Gait velocity: slowed Gait velocity interpretation: <1.31 ft/sec, indicative of household ambulator    Posture / Balance Dynamic Sitting Balance Sitting balance - Comments: R bias; poor midline orientation Balance Overall balance assessment: Needs assistance Sitting-balance support: Feet supported,Bilateral upper extremity supported Sitting balance-Leahy Scale: Fair Sitting balance - Comments: R bias; poor midline orientation Postural control: Right lateral lean Standing balance support: Bilateral upper extremity supported Standing balance-Leahy Scale: Poor Standing balance comment: reliant on external support, right lean with gait    Special needs/care consideration Oxygen: on 8 L via trach collar, Skin: serous blister on R arm, ecchymosis on bilateral upper and lower extremities.  Special service needs: Pt. With Coretrak and #6 cuffed trach, tolerating full deflation   Previous Home Environment (from acute therapy documentation) Living Arrangements: Children  Lives With: Other (Comment) Available Help at Discharge: Family,Available 24 hours/day Type of Home: Apartment Home Layout: One level Home Access: Stairs to enter Entrance Stairs-Rails: None Bathroom Shower/Tub: Multimedia programmer: Handicapped height Bathroom Accessibility: Yes How Accessible: Accessible via walker,Accessible via wheelchair [place]: No Additional Comments: pt unable to provide home setup or PLOF and no family present  Discharge Living Setting Plans for Discharge Living Setting: Patient's home Type of Home at Discharge: Apartment Discharge Home Layout:  One level Discharge Home Access: Level entry Discharge Bathroom Shower/Tub: Walk-in shower Discharge Bathroom Toilet: Handicapped height Discharge Bathroom Accessibility: Yes How Accessible: Accessible via walker,Accessible via wheelchair Does the patient have any problems obtaining your medications?: No  Social/Family/Support Systems Patient Roles: Parent Contact Information: [phone] Anticipated Caregiver: Charlann Noss (mother) Anticipated Caregiver's Contact Information: [phone] Ability/Limitations of Caregiver: Can provide Min A Caregiver Availability: 24/7 Discharge Plan Discussed with Primary Caregiver: Yes Is Caregiver In Agreement with Plan?: Yes Does Caregiver/Family have Issues with Lodging/Transportation while Pt is in Rehab?: No  Goals Patient/Family Goal for Rehab: PT/SLP/OT Supervision Expected length of stay: 10-12 days Pt/Family Agrees to Admission and willing to participate: Yes Program Orientation Provided & Reviewed with Pt/Caregiver Including Roles  & Responsibilities: Yes  Decrease burden of Care through IP rehab admission: Specialzed equipment needs, Decannulation, Diet advancement, Decrease number of caregivers and Patient/family education  Possible need for SNF placement upon discharge: not anticipated  Patient Condition: I have reviewed medical records from [facility], spoken with CM, and patient and family member. I met with patient at the bedside and discussed via phone for inpatient rehabilitation assessment.  Patient will benefit from ongoing PT, OT and  SLP, can actively participate in 3 hours of therapy a day 5 days of the week, and can make measurable gains during the admission.  Patient will also benefit from the coordinated team approach during an Inpatient Acute Rehabilitation admission.  The patient will receive intensive therapy as well as Rehabilitation physician, nursing, social worker, and care management  interventions.  Due to safety, skin/wound care, disease management, medication administration, pain management and patient education the patient requires 24 hour a day rehabilitation nursing.  The patient is currently min A +2  with mobility and basic ADLs.  Discharge setting and therapy post discharge at home with home health is anticipated.  Patient has agreed to participate in the Acute Inpatient Rehabilitation Program and will admit tomorrow.  Preadmission Screen Completed By:  Genella Mech, 03/05/2020 11:40 AM ______________________________________________________________________   Discussed status with Dr. Posey Pronto  on 03/05/20 at  9:30 and received approval for admission today.  Admission Coordinator:  Genella Mech, CCC-SLP, time 11:40 Sudie Grumbling 03/05/20  Assessment/Plan: Diagnosis: Covid debility 1. Does the need for close, 24 hr/day Medical supervision in concert with the patient's rehab needs make it unreasonable for this patient to be served in a less intensive setting? Yes 2. Co-Morbidities requiring supervision/potential complications: anxiety disorder (ensure anxiety and resulting apprehension do not limit functional progress; consider prn medications if warranted), morbid obesity, GERD, delirium, tachypnea (monitor RR and O2 Sats with increased physical exertion), Tachycardia (monitor in accordance with pain and increasing activity), dysphagia (advance diet as tolerated) 3. Due to safety, disease management, pain management and patient education, does the patient require 24 hr/day rehab nursing? Yes 4. Does the patient require coordinated care of a physician, rehab nurse, PT, OT, and SLP to address physical and functional deficits in the context of the above medical diagnosis(es)? Yes Addressing deficits in the following areas: balance, endurance, locomotion, strength, transferring, bathing, dressing, toileting, speech, swallowing and psychosocial support 5. Can the patient actively  participate in an intensive therapy program of at least 3 hrs of therapy 5 days a week? Yes 6. The potential for patient to make measurable gains while on inpatient rehab is excellent and good 7. Anticipated functional outcomes upon discharge from inpatient rehab: supervision PT, supervision OT, modified independent SLP 8. Estimated rehab length of stay to reach the above functional goals is: 10-14 days. 9. Anticipated discharge destination: Home 10. Overall Rehab/Functional Prognosis: excellent   MD Signature: Delice Lesch, MD, ABPMR

## 2020-02-29 NOTE — Progress Notes (Signed)
  Speech Language Pathology Treatment: Dysphagia;Passy Muir Speaking valve  Patient Details Name: Belinda [name] MRN: [id number] DOB: 10-27-1980 Today's Date: 02/29/2020 Time: 8938-1017 SLP Time Calculation (min) (ACUTE ONLY): 18 min  Assessment / Plan / Recommendation Clinical Impression  Pt seen with PMSV in place, still with aphonic voicing. WIth cues was able to branch from humming to phonatiing at word level to short phrases. Phonation still low volume and unintelligible at times, but pt making progress with cognitive initiating adduction which I don't think she was doing previously.  Per RN intake has been poor. This am pt unwilling to eat any breakfast duet to nausea. She reports she ate well the night before, about 50% but RN states she was told only 10% in report. Pt continues to dmeosntrates mild coughing with thin liquids. COntinue current diet with use of PMSV. Will f/u for tolerance and advancement.    HPI HPI: Pt is a 40 year old female with medical history significant for obesity.  She presented to [facility] ED on 12/15 with 24 to 48 hours of AMS following exposure to her COVID+ son. CXR showed diffuse bilateral airspace disease, she was confused and agitated. Physical restraints applied on admission and Precedex infusion given.  She required 100% nonrebreather mask and was transferred to [facility] for further intervention. CXR 12/29: Improved bilateral ventilation with residual confluent bibasilar opacity. CT head negative. ETT 12/17-trach 12/28. Stat CT head completed on 1/3 due to increased weakness noted by PT and was negative.      SLP Plan  Continue with current plan of care       Recommendations  Diet recommendations: Dysphagia 2 (fine chop);Nectar-thick liquid Liquids provided via: Cup;Straw Medication Administration: Whole meds with puree Supervision: Patient able to self feed Compensations: Slow rate;Small sips/bites      Patient may use Passy-Muir  Speech Valve: During all therapies with supervision;Intermittently with supervision PMSV Supervision: Full         General recommendations: Rehab consult Oral Care Recommendations: Oral care BID Follow up Recommendations: Inpatient Rehab Plan: Continue with current plan of care       GO               Herbie Baltimore, MA [place] Pager [phone] Office [phone]  Lynann Beaver 02/29/2020, 2:15 PM

## 2020-02-29 NOTE — Progress Notes (Signed)
Occupational Therapy Treatment Patient Details Name: Belinda [name] MRN: [id number] DOB: November 14, 1980 Today's Date: 02/29/2020    History of present illness 40 yo admitted 12/15 with AMS, fever, ARDS due to Covid 19 (+) and UTI. Intubated 12/17, trach 12/28. Pt with initial agitation/ delirium during admission. 1/3 pt with impaired balance and function with CT negative for stroke. PMhx: obesity   OT comments  Pt making excellent progress. Mobilized OOB to chair with min A @ RW level. HR 122 at rest, Max 140s with activity. Pt on 35%; 8L TC and desats to @ 85 with activity but quickly rebounds to 90s. Pt complains of feeling "winded" but does well to slow her RR. Increased ability to participate in ADL tasks as noted below. Pt ate at least 50% of meal during session. Pt's Dad called and she was able to hear his voice and "talk" with him with help of therapist. Pt appropriately tearful/emotional. Pt asking appropriate questions about "missing the last 3 weeks of my life". Explained her feelings were normal after a prolonged illness and ICU experience. Pt interested in a "hospital journal" to understand her hospital events. Pt is very motivated and is an excellent CIR candidate. Very appreciative. Will continue to follow. REecommend OOB daily with nsg. Pt min A with transfers - recommend use of BSC at times during the day rather than purewick.  Follow Up Recommendations  CIR;Supervision/Assistance - 24 hour    Equipment Recommendations  3 in 1 bedside commode;Other (comment)    Recommendations for Other Services Rehab consult    Precautions / Restrictions Precautions Precautions: Fall Precaution Comments: trach, cortrak, watch HR       Mobility Bed Mobility Overal bed mobility: Needs Assistance Bed Mobility: Supine to Sit     Supine to sit: HOB elevated;Min assist Sit to supine: Supervision      Transfers Overall transfer level: Needs assistance Equipment used: Rolling walker (2  wheeled) Transfers: Sit to/from Stand Sit to Stand: Min assist              Balance Overall balance assessment: Needs assistance   Sitting balance-Leahy Scale: Fair Sitting balance - Comments: R bias; poor midline orientation   Standing balance support: Bilateral upper extremity supported Standing balance-Leahy Scale: Poor Standing balance comment: reliant on external support, right lean with gait. min assist for static standing at sink                           ADL either performed or assessed with clinical judgement   ADL Overall ADL's : Needs assistance/impaired     Grooming: Set up;Sitting   Upper Body Bathing: Minimal assistance;Sitting   Lower Body Bathing: Moderate assistance;Sit to/from stand   Upper Body Dressing : Minimal assistance;Sitting   Lower Body Dressing: Moderate assistance;Sit to/from stand   Toilet Transfer: Minimal assistance;Stand-pivot   Toileting- Clothing Manipulation and Hygiene: Moderate assistance;Sit to/from stand       Functional mobility during ADLs: Minimal assistance;Rolling walker;Cueing for safety General ADL Comments: increased time spent helping pt comb through her matted hair     Vision   Additional Comments: improved; no longer complaining of double vision   Perception     Praxis      Cognition Arousal/Alertness: Awake/alert Behavior During Therapy: WFL for tasks assessed/performed Overall Cognitive Status: Impaired/Different from baseline Area of Impairment: Orientation;Attention;Memory;Following commands;Safety/judgement;Awareness;Problem solving                 Orientation  Level: Disoriented to;Time Current Attention Level: Sustained Memory: Decreased recall of precautions;Decreased short-term memory Following Commands: Follows one step commands consistently Safety/Judgement: Decreased awareness of safety;Decreased awareness of deficits Awareness: Emergent Problem Solving: Slow  processing General Comments: continues to improve; discussing hospitalization and being worried about not remembering things; pt interested in making a hospital event journal to help with coping with "loss of itme"        Exercises General Exercises - Lower Extremity Ankle Circles/Pumps:  (focus on heel cord stretch) Hip Flexion/Marching:  (unable to full offload weight but provided good heel cord stretch for stance LE)   Shoulder Instructions       General Comments 35% FiO2;8L    Pertinent Vitals/ Pain       Pain Assessment: Faces Faces Pain Scale: Hurts little more Pain Location: with heel cord stretching in standing Pain Descriptors / Indicators: Grimacing;Guarding  Home Living                                          Prior Functioning/Environment              Frequency  Min 3X/week        Progress Toward Goals  OT Goals(current goals can now be found in the care plan section)  Progress towards OT goals: Progressing toward goals  Acute Rehab OT Goals Patient Stated Goal: to get stronger and go to rehab OT Goal Formulation: With patient Time For Goal Achievement: 03/10/20 Potential to Achieve Goals: Good ADL Goals Pt Will Perform Grooming: with set-up;sitting;with supervision Pt Will Perform Upper Body Bathing: with set-up;with supervision;sitting Pt Will Perform Lower Body Bathing: with min assist;bed level Pt Will Transfer to Toilet: with min assist;stand pivot transfer;bedside commode Additional ADL Goal #1: Pt will use partial occlusion as needed with min vc to compensate for diplopia to increase functional vision Additional ADL Goal #2: Pt will participate in 20 min ADL task/mobility while maintaining SpO2 above 90 using compensatory strateiges and energy conservation techniques Additional ADL Goal #3: Pt will demonstrate anticipatory awareness during ADL tasks with min vc  Plan Discharge plan remains appropriate;Frequency needs to be  updated    Co-evaluation                 AM-PAC OT "6 Clicks" Daily Activity     Outcome Measure   Help from another person eating meals?: A Little Help from another person taking care of personal grooming?: A Little Help from another person toileting, which includes using toliet, bedpan, or urinal?: A Lot Help from another person bathing (including washing, rinsing, drying)?: A Lot Help from another person to put on and taking off regular upper body clothing?: A Little Help from another person to put on and taking off regular lower body clothing?: A Lot 6 Click Score: 15    End of Session Equipment Utilized During Treatment: Gait belt;Rolling walker;Oxygen (35% FiO2)  OT Visit Diagnosis: Unsteadiness on feet (R26.81);Other abnormalities of gait and mobility (R26.89);Muscle weakness (generalized) (M62.81);Low vision, both eyes (H54.2);Other symptoms and signs involving the nervous system (R29.898);Other symptoms and signs involving cognitive function;Pain Pain - Right/Left:  (B) Pain - part of body:  (heel cords)   Activity Tolerance Patient tolerated treatment well   Patient Left in chair;with call bell/phone within reach;with chair alarm set   Nurse Communication Mobility status        Time: 7048-8891  OT Time Calculation (min): 60 min  Charges: OT General Charges $OT Visit: 1 Visit OT Treatments $Self Care/Home Management : 53-67 mins  Maurie Boettcher, OT/L   Acute OT Clinical Specialist [place] Pager [phone] Office [phone]    [facility] 02/29/2020, 5:17 PM

## 2020-02-29 NOTE — Plan of Care (Signed)
  Problem: Education: Goal: Knowledge of General Education information will improve Description: Including pain rating scale, medication(s)/side effects and non-pharmacologic comfort measures Outcome: Progressing   Problem: Health Behavior/Discharge Planning: Goal: Ability to manage health-related needs will improve Outcome: Progressing   Problem: Clinical Measurements: Goal: Ability to maintain clinical measurements within normal limits will improve Outcome: Progressing Goal: Will remain free from infection Outcome: Progressing Goal: Diagnostic test results will improve Outcome: Progressing Goal: Respiratory complications will improve Outcome: Progressing Goal: Cardiovascular complication will be avoided Outcome: Progressing   Problem: Activity: Goal: Risk for activity intolerance will decrease Outcome: Progressing   Problem: Nutrition: Goal: Adequate nutrition will be maintained Outcome: Progressing   Problem: Coping: Goal: Level of anxiety will decrease Outcome: Progressing   Problem: Elimination: Goal: Will not experience complications related to bowel motility Outcome: Progressing Goal: Will not experience complications related to urinary retention Outcome: Progressing   Problem: Pain Managment: Goal: General experience of comfort will improve Outcome: Progressing   Problem: Safety: Goal: Ability to remain free from injury will improve Outcome: Progressing   Problem: Skin Integrity: Goal: Risk for impaired skin integrity will decrease Outcome: Progressing   Problem: Education: Goal: Knowledge of risk factors and measures for prevention of condition will improve Outcome: Progressing   Problem: Coping: Goal: Psychosocial and spiritual needs will be supported Outcome: Progressing   Problem: Respiratory: Goal: Will maintain a patent airway Outcome: Progressing Goal: Complications related to the disease process, condition or treatment will be avoided or  minimized Outcome: Progressing   Problem: Safety: Goal: Non-violent Restraint(s) Outcome: Progressing

## 2020-02-29 NOTE — Procedures (Addendum)
Cortrak  Person Inserting Tube:  Esaw Dace, RD Tube Type:  Cortrak - 43 inches Tube Location:  Left nare Initial Placement:  Stomach Secured by: Bridle Technique Used to Measure Tube Placement:  Documented cm marking at nare/ corner of mouth Cortrak Secured At:  66 cm    Cortrak Tube Team Note:  Consult received to replace a Cortrak feeding tube; current Cortrak currently clogged. Previous Cortrak removed but bridle retained. Cortrak successfully replaced  X-ray is required, abdominal x-ray has been ordered by the Cortrak team. Please confirm tube placement before using the Cortrak tube.   If the tube becomes dislodged please keep the tube and contact the Cortrak team at [email or web address] (password [email or web address]) for replacement.  If after hours and replacement cannot be delayed, place a NG tube and confirm placement with an abdominal x-ray.    Kerman Passey MS, RDN, LDN, CNSC Registered Dietitian III Clinical Nutrition RD Pager and On-Call Pager Number Located in [place]

## 2020-02-29 NOTE — Progress Notes (Addendum)
Nutrition Follow-up  RD working remotely.  DOCUMENTATION CODES:   Obesity unspecified  INTERVENTION:   - Recommend bowel regimen as last documented BM was on 02/24/20  Transition to nocturnal tube feeds via Cortrak: - Vital 1.5 @ 80 ml/hr x 12 hours from 1800 to 0600 (total of 960 ml) - Continue ProSource TF 45 ml TID - Free water flushes of 200 ml q 6 hours  Nocturnal tube feeding regimen provides 1560 kcal, 98 grams of protein, and 733 ml of H2O (meets 71% of kcal needs and 85% of protein needs).  Total free water with flushes: 1533 ml  - Continue MVI with minerals daily per tube  - Magic Cup TID with meals, each supplement provides 290 kcal and 9 grams of protein (orange cream flavor)  - Ensure Enlive po BID, each supplement provides 350 kcal and 20 grams of protein, thickened to honey-thick consistency (vanilla flavor)  - Encourage PO intake  NUTRITION DIAGNOSIS:   Inadequate oral intake related to poor appetite,dysphagia as evidenced by meal completion < 50%.  Progressing, pt now on dysphagia 2 diet with honey-thick liquids  GOAL:   Patient will meet greater than or equal to 90% of their needs  Progressing, being addressed via nocturnal TF  MONITOR:   PO intake,Diet advancement,Supplement acceptance,Labs,Weight trends,TF tolerance,I & O's  REASON FOR ASSESSMENT:   Consult,Ventilator Enteral/tube feeding initiation and management  ASSESSMENT:   40 yo female admitted with severe COVID-19 pneumonia with ARDS requiring intubation. No PMH other than childhood asthma, anxiety  12/15 - [facility] with ARDS, COVID+ 12/16 - transferred to [facility] 12/17 - Cortrak placed, intubated 12/26 - new info obtained: pt on suboxone with hx of opioid OD in 2018 12/28 - trach placed, off paralytics 01/05 - MBS, diet advanced to dysphagia 2 with honey-thick liquids 01/06 - tolerating TC for 48 hours, transferred out of ICU  Pt has been tolerating trach collar for over 48  hours. Cortrak clogged yesterday. Pt was started on D5 @ 50 ml/hr. Plan for replacement today by Cortrak team.  RD will transition pt to nocturnal tube feeds to stimulate appetite and promote PO intake during the day. Discussed with RN.  Last BM documented as 02/24/20. Recommend bowel regimen.  RD will order Magic Cups with meals to aid pt in meeting kcal and protein needs via PO route.  Admit weight: 106.2 kg Current weight: 93.9 kg  Meal Completion: 5-50%  Medications reviewed and include: vitamin C 500 mg daily, lasix, SSI q 4 hours, levemir 10 units BID, methadone, MVI with minerals, protonix, klor-con, thiamine, zinc sulfate 220 mg daily IVF: D5 @ 50 ml/hr  Labs reviewed: elevated LFTs CBG's: 91-134 x 24 hours  UOP: 825 ml x 24 hours  Diet Order:   Diet Order            DIET DYS 2 Room service appropriate? Yes; Fluid consistency: Honey Thick  Diet effective now                 EDUCATION NEEDS:   Not appropriate for education at this time  Skin:  Skin Assessment: Reviewed RN Assessment  Last BM:  02/24/20  Height:   Ht Readings from Last 1 Encounters:  02/06/20 5' 3"  (1.6 m)    Weight:   Wt Readings from Last 1 Encounters:  02/29/20 93.9 kg    BMI:  Body mass index is 36.67 kg/m.  Estimated Nutritional Needs:   Kcal:  2200-2400  Protein:  115-130 grams  Fluid:  >/= 2 L    Gustavus Bryant, MS, RD, LDN Inpatient Clinical Dietitian Please see AMiON for contact information.

## 2020-02-29 NOTE — Progress Notes (Signed)
Physical Therapy Treatment Patient Details Name: Belinda [name] MRN: [id number] DOB: 19-Dec-1980 Today's Date: 02/29/2020    History of Present Illness 40 yo admitted 12/15 with AMS, fever, ARDS due to Covid 19 (+) and UTI. Intubated 12/17, trach 12/28. Pt with initial agitation/ delirium during admission. 1/3 pt with impaired balance and function with CT negative for stroke. PMhx: obesity    PT Comments    Pt is awaiting chest xray for proper Cortrak placement. RN agreeable to therapy with return to bed. Pt is limited in safe mobility by increased HR with mobility, heel cord pain with weightbearing in presence of decreased strength, balance and endurance. Pt is min Ax2 for bed mobility, sit<>stand and stepping along EoB. Pt able to participate in standing exercise before Xray arrives. D/c plans remain appropriate. PT will continue to follow acutely.     Follow Up Recommendations  CIR;Supervision/Assistance - 24 hour     Equipment Recommendations  Rolling walker with 5" wheels;3in1 (PT)       Precautions / Restrictions Precautions Precautions: Fall Precaution Comments: trach, cortrak, watch HR Restrictions Weight Bearing Restrictions: No    Mobility  Bed Mobility Overal bed mobility: Needs Assistance Bed Mobility: Supine to Sit;Sit to Supine     Supine to sit: HOB elevated;Min assist Sit to supine: Min assist;+2 for physical assistance   General bed mobility comments: able to navigate LE off bed, requires min A for bringing trunk to upright  Transfers Overall transfer level: Needs assistance Equipment used: Rolling walker (2 wheeled) Transfers: Sit to/from Stand Sit to Stand: Min assist;+2 safety/equipment         General transfer comment: min +2 assist to stand from bed initially, minguard for additional standing with repeated cues for hand placement  Ambulation/Gait Ambulation/Gait assistance: +2 physical assistance;Min assist Gait Distance (Feet): 3  Feet Assistive device: Rolling walker (2 wheeled) Gait Pattern/deviations: Step-to pattern;Trunk flexed;Narrow base of support Gait velocity: slowed Gait velocity interpretation: <1.31 ft/sec, indicative of household ambulator General Gait Details: minAx2 for steadying with lateral steps towards HoB         Balance Overall balance assessment: Needs assistance   Sitting balance-Leahy Scale: Fair Sitting balance - Comments: R bias; poor midline orientation   Standing balance support: Bilateral upper extremity supported Standing balance-Leahy Scale: Poor Standing balance comment: reliant on external support, right lean with gait. min assist for static standing at sink                            Cognition Arousal/Alertness: Awake/alert Behavior During Therapy: WFL for tasks assessed/performed Overall Cognitive Status: Impaired/Different from baseline Area of Impairment: Orientation;Attention;Memory;Following commands;Safety/judgement;Awareness;Problem solving                 Orientation Level: Disoriented to;Time Current Attention Level: Sustained Memory: Decreased recall of precautions;Decreased short-term memory Following Commands: Follows one step commands consistently Safety/Judgement: Decreased awareness of safety;Decreased awareness of deficits   Problem Solving: Slow processing        Exercises General Exercises - Lower Extremity Ankle Circles/Pumps: AAROM;Both;10 reps;Supine (focus on heel cord stretch) Hip Flexion/Marching: AROM;Both;Standing (unable to full offload weight but provided good heel cord stretch for stance LE)    General Comments General comments (skin integrity, edema, etc.): FiO2 35% on 8L O2 via trach collar, HR increase to 142 with stepping towards HoB, seated restbreak and HR to 132 with marching in place      Pertinent Vitals/Pain Pain Assessment: Faces Faces  Pain Scale: Hurts little more Pain Location: with heel cord  stretching in standing Pain Descriptors / Indicators: Grimacing;Guarding Pain Intervention(s): Limited activity within patient's tolerance;Monitored during session;Repositioned           PT Goals (current goals can now be found in the care plan section) Acute Rehab PT Goals PT Goal Formulation: Patient unable to participate in goal setting Time For Goal Achievement: 03/06/20 Potential to Achieve Goals: Fair Progress towards PT goals: Progressing toward goals    Frequency    Min 3X/week      PT Plan Current plan remains appropriate       AM-PAC PT "6 Clicks" Mobility   Outcome Measure  Help needed turning from your back to your side while in a flat bed without using bedrails?: A Little Help needed moving from lying on your back to sitting on the side of a flat bed without using bedrails?: A Little Help needed moving to and from a bed to a chair (including a wheelchair)?: A Little Help needed standing up from a chair using your arms (e.g., wheelchair or bedside chair)?: A Little Help needed to walk in hospital room?: A Lot Help needed climbing 3-5 steps with a railing? : Total 6 Click Score: 15    End of Session Equipment Utilized During Treatment: Gait belt Activity Tolerance: Patient tolerated treatment well Patient left: in chair;with call bell/phone within reach;with chair alarm set Nurse Communication: Mobility status PT Visit Diagnosis: Other abnormalities of gait and mobility (R26.89);Muscle weakness (generalized) (M62.81);Other symptoms and signs involving the nervous system (U44.034)     Time: 7425-9563 PT Time Calculation (min) (ACUTE ONLY): 27 min  Charges:  $Therapeutic Exercise: 8-22 mins $Therapeutic Activity: 8-22 mins                     Jenia Klepper B. Migdalia Dk PT, DPT Acute Rehabilitation Services Pager [phone] Office [phone]    [place] 02/29/2020, 1:05 PM

## 2020-03-01 DIAGNOSIS — U071 COVID-19: Secondary | ICD-10-CM | POA: Diagnosis not present

## 2020-03-01 DIAGNOSIS — G9341 Metabolic encephalopathy: Secondary | ICD-10-CM | POA: Diagnosis not present

## 2020-03-01 DIAGNOSIS — E237 Disorder of pituitary gland, unspecified: Secondary | ICD-10-CM | POA: Diagnosis not present

## 2020-03-01 DIAGNOSIS — D649 Anemia, unspecified: Secondary | ICD-10-CM | POA: Diagnosis not present

## 2020-03-01 DIAGNOSIS — R748 Abnormal levels of other serum enzymes: Secondary | ICD-10-CM | POA: Diagnosis not present

## 2020-03-01 DIAGNOSIS — J9601 Acute respiratory failure with hypoxia: Secondary | ICD-10-CM | POA: Diagnosis not present

## 2020-03-01 LAB — CBC
HCT: 30.5 % — ABNORMAL LOW (ref 36.0–46.0)
Hemoglobin: 10.2 g/dL — ABNORMAL LOW (ref 12.0–15.0)
MCH: 30.9 pg (ref 26.0–34.0)
MCHC: 33.4 g/dL (ref 30.0–36.0)
MCV: 92.4 fL (ref 80.0–100.0)
Platelets: 215 10*3/uL (ref 150–400)
RBC: 3.3 MIL/uL — ABNORMAL LOW (ref 3.87–5.11)
RDW: 13.5 % (ref 11.5–15.5)
WBC: 9.3 10*3/uL (ref 4.0–10.5)
nRBC: 0 % (ref 0.0–0.2)

## 2020-03-01 LAB — COMPREHENSIVE METABOLIC PANEL
ALT: 88 U/L — ABNORMAL HIGH (ref 0–44)
AST: 45 U/L — ABNORMAL HIGH (ref 15–41)
Albumin: 2.7 g/dL — ABNORMAL LOW (ref 3.5–5.0)
Alkaline Phosphatase: 88 U/L (ref 38–126)
Anion gap: 12 (ref 5–15)
BUN: 24 mg/dL — ABNORMAL HIGH (ref 6–20)
CO2: 26 mmol/L (ref 22–32)
Calcium: 8.5 mg/dL — ABNORMAL LOW (ref 8.9–10.3)
Chloride: 96 mmol/L — ABNORMAL LOW (ref 98–111)
Creatinine, Ser: 0.67 mg/dL (ref 0.44–1.00)
GFR, Estimated: >60 mL/min (ref >60)
Glucose, Bld: 128 mg/dL — ABNORMAL HIGH (ref 70–99)
Potassium: 3.7 mmol/L (ref 3.5–5.1)
Sodium: 134 mmol/L — ABNORMAL LOW (ref 135–145)
Total Bilirubin: 0.8 mg/dL (ref 0.3–1.2)
Total Protein: 6.9 g/dL (ref 6.5–8.1)

## 2020-03-01 LAB — MAGNESIUM: Magnesium: 2.2 mg/dL (ref 1.7–2.4)

## 2020-03-01 LAB — GLUCOSE, CAPILLARY
Glucose-Capillary: 154 mg/dL — ABNORMAL HIGH (ref 70–99)
Glucose-Capillary: 98 mg/dL (ref 70–99)
Glucose-Capillary: 113 mg/dL — ABNORMAL HIGH (ref 70–99)
Glucose-Capillary: 84 mg/dL (ref 70–99)
Glucose-Capillary: 134 mg/dL — ABNORMAL HIGH (ref 70–99)

## 2020-03-01 MED ORDER — FREE WATER
150.0000 mL | Freq: Four times a day (QID) | Status: DC
  Administered 2020-03-01 – 2020-03-02 (×4): 150 mL

## 2020-03-01 MED ORDER — METHADONE HCL 10 MG PO TABS
15.0000 mg | ORAL_TABLET | Freq: Two times a day (BID) | ORAL | Status: DC
  Administered 2020-03-01 – 2020-03-02 (×3): 15 mg via ORAL
  Filled 2020-03-01 (×3): qty 2

## 2020-03-01 MED ORDER — FUROSEMIDE 20 MG PO TABS
20.0000 mg | ORAL_TABLET | Freq: Every day | ORAL | Status: DC
Start: 2020-03-02 — End: 2020-03-05
  Administered 2020-03-02 – 2020-03-05 (×4): 20 mg via ORAL
  Filled 2020-03-01 (×4): qty 1

## 2020-03-01 MED ORDER — LORAZEPAM 2 MG/ML IJ SOLN: 1.0000 mg | Freq: Four times a day (QID) | INTRAMUSCULAR | Status: DC | PRN

## 2020-03-01 MED ORDER — INSULIN DETEMIR 100 UNIT/ML ~~LOC~~ SOLN
5.0000 [IU] | Freq: Two times a day (BID) | SUBCUTANEOUS | Status: DC
  Administered 2020-03-01 – 2020-03-05 (×8): 5 [IU] via SUBCUTANEOUS
  Filled 2020-03-01 (×9): qty 0.05

## 2020-03-01 MED ORDER — HYDROMORPHONE HCL 2 MG PO TABS
2.0000 mg | ORAL_TABLET | Freq: Three times a day (TID) | ORAL | Status: DC
  Administered 2020-03-01 – 2020-03-02 (×5): 2 mg via ORAL
  Filled 2020-03-01 (×5): qty 1

## 2020-03-01 NOTE — Progress Notes (Signed)
PROGRESS NOTE                                                                             PROGRESS NOTE                                                                                                                                                                                                             Patient Demographics:    Belinda [name], is a 40 y.o. female, DOB - Mar 10, 1980, [id number]  Outpatient Primary MD for the patient is Kathyrn Drown, MD    LOS - 24  Admit date - 02/06/2020    Chief Complaint  Patient presents with  . Altered Mental Status       Brief Narrative    40 year old white female, only medical history is obesity.  Presented to [facility] ER 12/15 w/ ARDS and acute metabolic encephalopathy 2/2 COVID.  Patient was admitted to the ICU.  She had to be intubated.  She was given treatment for COVID-19.  There was also concern for withdrawal since patient does have a history of opioid dependence.  She apparently uses Suboxone which is obtained illegally.  Patient was subsequently started on methadone and scheduled hydromorphone by critical care medicine.  Subsequently underwent tracheostomy placement.  Weaned off of ventilator..  Remains on tube feedings.      Subjective:   Patient noted to be much more awake and alert. Less distracted. Still has difficulty communicating due to the tracheostomy. Denies any complaints this morning. States that she does not like to eat breakfast but likes to eat lunch and dinner.    Assessment  & Plan :   Acute hypoxic respiratory failure/ARDS/status post tracheostomy  Patient was admitted to the intensive care unit initially.  Was placed on the ventilator.  Subsequently underwent tracheostomy on 12/28.  Has been weaned off of ventilator.  Tolerating trach collar.  Patient has completed course of antibacterials.  Tracheostomy care per pulmonology. We will have pulmonology evaluate  tracheostomy on Monday.  Pneumonia  due to COVID-19  Patient is stable from a COVID-19 standpoint. She is status post treatment with Remdesivir and steroids. She also completed course of baricitinib.  Covid isolation discontinued from 02/28/2020.   Klebsiella urinary tract infection with sepsis syndrome with or without aspiration pneumonia  - unasyn ended 02/11/20 - Staph NOS VAP on 12/26 - Resp Cx MSSA on respiratory culture, complete cefazolin 02/23/18/2022 Remains stable from an infectious disease standpoint. Currently not on any antibacterials.  Acute metabolic encephalopathy Encephalopathy is in the setting of sepsis, drug abuse and Covid infection.  Started on Risperdal with improvement.  Patient also noted to be on clonazepam.  History of opioid dependence Apparently uses alprazolam on a chronic basis. Has history of opioid overdose in 2018.  As per her father she has been illegally obtaining Suboxone and has been consuming that at home.  Patient was started on scheduled methadone on 12/25 by PCCM. This was mainly as patient was quite encephalopathic and there was concern that she might be going through opioid withdrawal. Noted to be on scheduled hydromorphone as well. We'll slowly start weaning her off of these medications. We will further decrease the dose of hydromorphone today. We will also cut back on methadone.  Volume overload ECHO normal 02/12/20. Patient on daily furosemide.  Monitor ins and outs daily weights. Change furosemide to oral. Replace potassium.  Hypophosphatemia Replete as needed  Anemia of critical illness Hemoglobin stable.  No evidence of overt bleeding. Anemia panel reviewed. Folate was 18.9, B12 590, ferritin 630.  Mild transaminitis Likely due to acute illness and COVID-19. Stable for the most part. Hepatitis panel is negative.  Nutrition/dysphagia Speech therapy is following. Advance to dysphagia two diet. Oral intake appears to be  improving. Continue tube feedings for now. On nocturnal tube feedings to help her recover her appetite. Core track feeding tube was replaced 1/7.  Prediabetes with Hyperglycemia HbA1c 5.8.  Elevated CBGs due to tube feedings.  Continue with SSI and Levemir as long as she is on tube feedings. CBGs noted to be lower than usual. Cut back on dose of Levemir.  Hypernatrmia Improved after she was started on free water. Sodium level now noted to be lower. Will cut back on free water.  Concern for pituitary lesion This was incidentally noted on MRI brain.  Will need a dedicated pituitary protocol MRI when patient is more stable.  Thyroid function tests were normal: TSH 3.45 and free T4 1.03.   Luteinizing hormone level noted to be 0.6.  Difficult to clinically interpret this level at this time.  Will need to determine if patient has had normal menstruation. Prolactin level noted to be 115.  Normal range is 4.8-23.3.  Patient however noted to be on Risperdal which can increase the level of prolactin.   IGF-I/Somatomedin-C level was 121 which is in the normal range. The following tests are still pending: plasma ACTH level.  Patient will eventually need to be seen by endocrinology.   DVT prophylaxis: Lovenox CODE STATUS: Full code Family communication: Patient's father being updated every other day. Disposition: Will likely need to go to rehabilitation when medically stable.  Status is: Inpatient  Remains inpatient appropriate because:IV treatments appropriate due to intensity of illness or inability to take PO and Inpatient level of care appropriate due to severity of illness   Dispo: The patient is from: Home              Anticipated d/c is to: CIR  Anticipated d/c date is: > 3 days              Patient currently is not medically stable to d/c.      Consults  :  PCCM, Neurology  Procedures  :  Trach     Inpatient Medications  Scheduled Meds: . chlorhexidine  gluconate (MEDLINE KIT)  15 mL Mouth Rinse BID  . Chlorhexidine Gluconate Cloth  6 each Topical Daily  . clonazePAM  2 mg Oral BID  . enoxaparin (LOVENOX) injection  0.5 mg/kg Subcutaneous Q24H  . feeding supplement  237 mL Oral BID BM  . feeding supplement (PROSource TF)  45 mL Per Tube TID  . feeding supplement (VITAL 1.5 CAL)  1,000 mL Per Tube Q24H  . free water  200 mL Per Tube Q6H  . furosemide  20 mg Intravenous Daily  . HYDROmorphone  2 mg Oral Q6H  . insulin aspart  0-20 Units Subcutaneous Q4H  . insulin detemir  10 Units Subcutaneous BID  . mouth rinse  15 mL Mouth Rinse 10 times per day  . melatonin  3 mg Oral QHS  . methadone  20 mg Oral Q12H  . multivitamin with minerals  1 tablet Oral Daily  . pantoprazole  40 mg Oral Q1200  . polyethylene glycol  17 g Oral Daily  . risperiDONE  3 mg Oral QHS  . thiamine  100 mg Oral Daily   Continuous Infusions: . sodium chloride Stopped (02/25/20 0849)  . dextrose 50 mL/hr at 02/29/20 0146  . lactated ringers 10 mL/hr at 02/08/20 1118   PRN Meds:.sodium chloride, acetaminophen, docusate, fentaNYL (SUBLIMAZE) injection, guaiFENesin, ipratropium-albuterol, LORazepam, ondansetron (ZOFRAN) IV, Resource ThickenUp Clear  Antibiotics  :    Anti-infectives (From admission, onward)   Start     Dose/Rate Route Frequency Ordered Stop   02/18/20 1200  ceFAZolin (ANCEF) IVPB 2g/100 mL premix        2 g 200 mL/hr over 30 Minutes Intravenous Every 8 hours 02/18/20 1104 02/23/20 2044   02/17/20 2000  vancomycin (VANCOCIN) IVPB 1000 mg/200 mL premix  Status:  Discontinued        1,000 mg 200 mL/hr over 60 Minutes Intravenous Every 8 hours 02/17/20 1146 02/18/20 1104   02/17/20 1345  vancomycin (VANCOREADY) IVPB 2000 mg/400 mL        2,000 mg 200 mL/hr over 120 Minutes Intravenous  Once 02/17/20 1257 02/17/20 1752   02/17/20 1245  vancomycin (VANCOCIN) 2,000 mg in sodium chloride 0.9 % 500 mL IVPB  Status:  Discontinued        2,000 mg 250  mL/hr over 120 Minutes Intravenous  Once 02/17/20 1146 02/17/20 1445   02/07/20 1600  Ampicillin-Sulbactam (UNASYN) 3 g in sodium chloride 0.9 % 100 mL IVPB        3 g 200 mL/hr over 30 Minutes Intravenous Every 6 hours 02/07/20 1513 02/11/20 2145   02/07/20 1000  remdesivir 100 mg in sodium chloride 0.9 % 100 mL IVPB        100 mg 200 mL/hr over 30 Minutes Intravenous Daily 02/06/20 2132 02/10/20 1103   02/07/20 0800  vancomycin (VANCOCIN) IVPB 750 mg/150 ml premix  Status:  Discontinued        750 mg 150 mL/hr over 60 Minutes Intravenous Every 12 hours 02/06/20 2023 02/07/20 1510   02/07/20 0400  ceFEPIme (MAXIPIME) 2 g in sodium chloride 0.9 % 100 mL IVPB  Status:  Discontinued  2 g 200 mL/hr over 30 Minutes Intravenous Every 8 hours 02/06/20 2023 02/07/20 1510   02/06/20 2145  remdesivir 100 mg in sodium chloride 0.9 % 100 mL IVPB        100 mg 200 mL/hr over 30 Minutes Intravenous Every 30 min 02/06/20 2132 02/06/20 2305   02/06/20 1915  vancomycin (VANCOREADY) IVPB 2000 mg/400 mL        2,000 mg 200 mL/hr over 120 Minutes Intravenous  Once 02/06/20 1907 02/06/20 2250   02/06/20 1845  ceFEPIme (MAXIPIME) 2 g in sodium chloride 0.9 % 100 mL IVPB        2 g 200 mL/hr over 30 Minutes Intravenous  Once 02/06/20 1834 02/06/20 2056   02/06/20 1845  metroNIDAZOLE (FLAGYL) IVPB 500 mg        500 mg 100 mL/hr over 60 Minutes Intravenous  Once 02/06/20 1834 02/06/20 2204   02/06/20 1845  vancomycin (VANCOCIN) IVPB 1000 mg/200 mL premix  Status:  Discontinued        1,000 mg 200 mL/hr over 60 Minutes Intravenous  Once 02/06/20 1834 02/06/20 1907       Bonnielee Haff M.D on 03/01/2020 at 10:14 AM  To page go to [email or web address]   [facility] -  Office  [phone]     Objective:   Vitals:   03/01/20 0400 03/01/20 0632 03/01/20 0745 03/01/20 0751  BP: 105/70   114/75  Pulse: (!) 101  (!) 104 (!) 105  Resp: (!) _0 Temp: 98.6 F (37 C)   99 F (37.2 C)   TempSrc: Oral   Oral  SpO2: 100%  100% 95%  Weight:  94 kg    Height:        Wt Readings from Last 3 Encounters:  03/01/20 94 kg     Intake/Output Summary (Last 24 hours) at 03/01/2020 1014 Last data filed at 03/01/2020 0751 Gross per 24 hour  Intake 1721.92 ml  Output 750 ml  Net 971.92 ml     Physical Exam  General appearance: Awake alert.  In no distress Tracheostomy and nasogastric feeding tube noted. Resp: Normal effort at rest. Coarse breath sounds with crackles bilateral bases. No wheezing or rhonchi. Cardio: S1-S2 is normal regular.  No S3-S4.  No rubs murmurs or bruit. Telemetry shows improvement in sinus tachycardia GI: Abdomen is soft.  Nontender nondistended.  Bowel sounds are present normal.  No masses organomegaly Extremities: No edema. Able to move her legs better than before. Still deconditioned. Neurologic:  No focal neurological deficits.       Data Review:    CBC Recent Labs  Lab 02/26/20 0400 02/27/20 0350 02/28/20 0614 02/29/20 0029 03/01/20 0044  WBC 8.6 7.9 7.7 8.2 9.3  HGB 11.0* 11.2* 10.6* 10.5* 10.2*  HCT 35.7* 36.5 32.6* 33.1* 30.5*  PLT 275 251 248 223 215  MCV 98.1 97.6 95.9 94.6 92.4  MCH 30.2 29.9 31.2 30.0 30.9  MCHC 30.8 30.7 32.5 31.7 33.4  RDW 13.8 13.7 13.7 13.7 13.5    Recent Labs  Lab 02/26/20 0400 02/27/20 0350 02/28/20 0614 02/29/20 0029 03/01/20 0044  NA 146* 145 143 136 134*  K 4.1 4.0 4.0 3.5 3.7  CL 104 103 98 95* 96*  CO2 _1 GLUCOSE 189* 97 96 105* 128*  BUN 49* 48* 39* 28* 24*  CREATININE 0.81 0.73 0.79 0.75 0.67  CALCIUM 9.5 9.6 9.2 8.9 8.5*  AST  --  83* 62*  63* 45*  ALT  --  145* 116* 109* 88*  ALKPHOS  --  104 100 94 88  BILITOT  --  0.8 0.8 1.0 0.8  ALBUMIN  --  3.3* 3.1* 3.0* 2.7*  MG 2.7*  --   --   --  2.2  TSH  --   --  3.450  --   --       Lab Results  Component Value Date   HGBA1C 5.8 (H) 02/08/2020    Micro Results Recent Results (from the past 240 hour(s))   Culture, respiratory (non-expectorated)     Status: None   Collection Time: 02/20/20 10:58 AM   Specimen: Tracheal Aspirate; Respiratory  Result Value Ref Range Status   Specimen Description TRACHEAL ASPIRATE  Final   Special Requests NONE  Final   Gram Stain   Final    NO WBC SEEN RARE GRAM POSITIVE COCCI IN PAIRS Performed at [facility], [address] [place], [place] [address]    Culture FEW STAPHYLOCOCCUS AUREUS  Final   Report Status 02/22/2020 FINAL  Final   Organism ID, Bacteria STAPHYLOCOCCUS AUREUS  Final      Susceptibility   Staphylococcus aureus - MIC*    CIPROFLOXACIN <=0.5 SENSITIVE Sensitive     ERYTHROMYCIN <=0.25 SENSITIVE Sensitive     GENTAMICIN <=0.5 SENSITIVE Sensitive     OXACILLIN 0.5 SENSITIVE Sensitive     TETRACYCLINE <=1 SENSITIVE Sensitive     VANCOMYCIN <=0.5 SENSITIVE Sensitive     TRIMETH/SULFA <=10 SENSITIVE Sensitive     CLINDAMYCIN <=0.25 SENSITIVE Sensitive     RIFAMPIN <=0.5 SENSITIVE Sensitive     Inducible Clindamycin NEGATIVE Sensitive     * FEW STAPHYLOCOCCUS AUREUS    Radiology Reports DG Chest 1 View  Result Date: 02/08/2020 CLINICAL DATA:  OG tube placement. EXAM: CHEST  1 VIEW COMPARISON:  Chest x-ray 02/07/2020. FINDINGS: 539 hours. Bilateral airspace disease noted, left greater than right. Endotracheal tube tip is approximately 4.3 cm above the base of the carina. Left IJ central line tip overlies the innominate vein confluence. NG tube tip is positioned in the stomach with the proximal side port below the GE junction. Telemetry leads overlie the chest. IMPRESSION: NG tube tip is in the stomach with proximal side port of the tube below the GE junction. Electronically Signed   By: Misty Stanley M.D.   On: 02/08/2020 05:54   CT HEAD WO CONTRAST  Result Date: 02/18/2020 CLINICAL DATA:  Delirium. Altered mental status. EXAM: CT HEAD WITHOUT CONTRAST TECHNIQUE: Contiguous axial images were obtained from the base of the skull  through the vertex without intravenous contrast. COMPARISON:  CT head without contrast 09/01/2017 at [facility]. FINDINGS: Brain: No acute infarct, hemorrhage, or mass lesion is present. No significant white matter lesions are present. The ventricles are of normal size. No significant extraaxial fluid collection is present. The brainstem and cerebellum are within normal limits. Vascular: No hyperdense vessel or unexpected calcification. Skull: Calvarium is intact. No focal lytic or blastic lesions are present. No significant extracranial soft tissue lesion is present. Sinuses/Orbits: Patient is intubated. Fluid level is present in the right maxillary sinus. Fluid is present in the nasopharynx. Fluid is present in the sphenoid sinuses and posterior ethmoid air cells bilaterally. Left nasogastric tube is noted. Minimal mastoid fluid is present inferiorly on both sides. IMPRESSION: 1. Normal CT appearance of the brain. 2. Fluid in the paranasal sinuses and nasopharynx likely related to intubation. 3.  Minimal mastoid fluid inferiorly on both sides. Electronically Signed   By: San Morelle M.D.   On: 02/18/2020 02:16   CT Head Wo Contrast  Result Date: 02/06/2020 CLINICAL DATA:  Altered mental status.  Recent tooth extraction. EXAM: CT HEAD WITHOUT CONTRAST TECHNIQUE: Contiguous axial images were obtained from the base of the skull through the vertex without intravenous contrast. COMPARISON:  Head CT 09/01/2017 FINDINGS: Brain: No intracranial hemorrhage, mass effect, or midline shift. No hydrocephalus. The basilar cisterns are patent. No evidence of territorial infarct or acute ischemia. No extra-axial or intracranial fluid collection. Vascular: No hyperdense vessel or unexpected calcification. Skull: No fracture or focal lesion. Sinuses/Orbits: Mucosal thickening of the right greater than left maxillary sinus. Scattered mucosal thickening throughout ethmoid air cells. Patient appears edentulous of  upper teeth, presumed prior periapical lucency in the right upper molars, contiguous with the right maxillary sinus. No acute orbital abnormality. Mastoid air cells are clear. Other: None. IMPRESSION: 1. No acute intracranial abnormality. 2. Paranasal sinus disease. 3. Patient appears edentulous of upper teeth, presumed prior periapical lucency in the right upper molars, contiguous with the right maxillary sinus. CLINICAL DATA:  Altered mental status. Recent tooth extraction. EXAM: CT HEAD WITHOUT CONTRAST TECHNIQUE: Contiguous axial images were obtained from the base of the skull through the vertex without intravenous contrast. COMPARISON:  Head CT 09/01/2017 FINDINGS: Brain: No intracranial hemorrhage, mass effect, or midline shift. No hydrocephalus. The basilar cisterns are patent. No evidence of territorial infarct or acute ischemia. No extra-axial or intracranial fluid collection. Vascular: No hyperdense vessel or unexpected calcification. Skull: No fracture or focal lesion. Sinuses/Orbits: Mucosal thickening of the right greater than left maxillary sinus. Scattered mucosal thickening throughout ethmoid air cells. Patient appears edentulous of upper teeth, presumed prior periapical lucency in the right upper molars, contiguous with the right maxillary sinus. No acute orbital abnormality. Mastoid air cells are clear. Other: None. IMPRESSION: 1. No acute intracranial abnormality. 2. Paranasal sinus mucosal thickening. 3. Patient appears edentulous of upper teeth, presumed prior periodontal disease in the right upper molars, contiguous with the right maxillary sinus. Electronically Signed   By: Keith Rake M.D.   On: 02/06/2020 22:18   MR BRAIN WO CONTRAST  Result Date: 02/25/2020 CLINICAL DATA:  Neuro deficit, acute, stroke suspected. Additional history provided: COVID positive. EXAM: MRI HEAD WITHOUT CONTRAST TECHNIQUE: Multiplanar, multiecho pulse sequences of the brain and surrounding structures were  obtained without intravenous contrast. COMPARISON:  Noncontrast head CT 02/25/2020. FINDINGS: Brain: Intermittently motion degraded exam. Most notably, there is moderate motion degradation of the axial T2/FLAIR sequence. Small focus of chronic cortical encephalomalacia within the anterolateral right frontal lobe (for instance as seen on series 7, images 14-16). 2 x 10 mm (AP x TV) T1 hyperintense lesion within the mid to posterior pituitary gland (for instance as seen on series 10, image 36). No focal parenchymal signal abnormality is identified elsewhere. There is no acute infarct. No extra-axial fluid collection. No midline shift. Vascular: Expected proximal arterial flow voids. Skull and upper cervical spine: No focal marrow lesion. Sinuses/Orbits: Visualized orbits show no acute finding. Paranasal sinus disease. Most notably, there is extensive partial opacification of the bilateral sphenoid sinuses and moderate right maxillary sinus mucosal thickening. Other: Small bilateral mastoid effusions. IMPRESSION: 1. Intermittently motion degraded exam. 2. No evidence of acute infarct. 3. Small focus of chronic cortical encephalomalacia within the anterolateral right frontal lobe. 4. 2 x 10 mm T1 hyperintense lesion within the mid-to-posterior pituitary gland. 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 However, a cystic adenoma or adenoma with prior hemorrhage cannot be excluded. Correlate with relevant laboratory values. A pituitary protocol brain MRI may be obtained for further characterization, if clinically warranted. 5. Otherwise unremarkable non-contrast MRI appearance of the brain. 6. Paranasal sinus disease, most notably severe bilateral sphenoid sinusitis. 7. Small bilateral mastoid effusions. Electronically Signed   By: Kellie Simmering DO   On: 02/25/2020 17:45   DG Chest Port 1 View  Result Date: 02/20/2020 CLINICAL DATA:  40 year old female COVID-29. EXAM: PORTABLE CHEST 1 VIEW  COMPARISON:  Portable chest 02/19/2020 and earlier. FINDINGS: Portable AP semi upright view at 0448 hours. Stable tracheostomy tube, left IJ approach central line and visible enteric tube. Larger lung volumes and improved ventilation. Largely regressed left upper lung opacity since yesterday. Residual confluent bibasilar opacity, including suspected left lower lobe collapse or consolidation. No pneumothorax. No definite pleural effusion. No acute osseous abnormality identified. Paucity of bowel gas in the upper abdomen. IMPRESSION: 1. Improved bilateral ventilation with residual confluent bibasilar opacity. 2. No new cardiopulmonary abnormality identified. 3.  Stable lines and tubes. Electronically Signed   By: Genevie Ann M.D.   On: 02/20/2020 07:03   DG Chest Port 1 View  Result Date: 02/19/2020 CLINICAL DATA:  Post tracheostomy, COVID positive EXAM: PORTABLE CHEST 1 VIEW COMPARISON:  Earlier same day FINDINGS: Tracheostomy device is present approximately 5 cm above the carina. Left IJ central line and enteric tube are again identified. Persistent bilateral pulmonary opacities similar to the prior study. Probable small pleural effusions. No pneumothorax. Similar cardiomediastinal contours. IMPRESSION: New tracheostomy device. Otherwise stable lines and tubes. Similar bilateral pulmonary opacities. Electronically Signed   By: Macy Mis M.D.   On: 02/19/2020 16:48   DG Chest Port 1 View  Result Date: 02/19/2020 CLINICAL DATA:  Hypoxia.  Respiratory failure.  COVID-19. EXAM: PORTABLE CHEST 1 VIEW COMPARISON:  02/17/2020. FINDINGS: Endotracheal tube, feeding tube, left IJ line in stable position. Cardiomegaly with pulmonary venous congestion. Bilateral interstitial infiltrates again noted. Bibasilar atelectasis. Small bilateral pleural effusions again noted. No pneumothorax. IMPRESSION: 1. Lines and tubes in stable position. 2. Cardiomegaly with pulmonary venous congestion. 3. Bilateral interstitial  infiltrates again noted. Bibasilar atelectasis. Small bilateral pleural effusions again noted. Electronically Signed   By: Marcello Moores  Register   On: 02/19/2020 05:19   DG CHEST PORT 1 VIEW  Result Date: 02/17/2020 CLINICAL DATA:  Intubation EXAM: PORTABLE CHEST 1 VIEW COMPARISON:  02/16/2020 FINDINGS: No significant interval change in AP portable chest radiograph, with diffuse bilateral interstitial heterogeneous airspace opacity, most conspicuous at the lung bases. Mild cardiomegaly. Support apparatus is unchanged including endotracheal tube, esophagogastric tube, and left neck vascular catheter. IMPRESSION: 1. No significant interval change in AP portable chest radiograph, with diffuse bilateral interstitial heterogeneous airspace opacity, most conspicuous at the lung bases. Findings are consistent with multifocal infection, edema, and/or ARDS. 2.  Unchanged support apparatus. Electronically Signed   By: Eddie Candle M.D.   On: 02/17/2020 11:26   DG CHEST PORT 1 VIEW  Result Date: 02/16/2020 CLINICAL DATA:  Altered mental status EXAM: PORTABLE CHEST 1 VIEW COMPARISON:  Yesterday FINDINGS: Endotracheal tube with tip halfway between the clavicular heads and carina. The feeding tube at least reaches the diaphragm. Left IJ line with tip at the brachiocephalic SVC confluence. Extensive bilateral pneumonia. Cardiomegaly. No visible air leak. IMPRESSION: Stable hardware positioning and extensive pneumonia. Electronically Signed   By: Monte Fantasia M.D.   On:  02/16/2020 07:08   DG CHEST PORT 1 VIEW  Result Date: 02/15/2020 CLINICAL DATA:  COVID positive.  Ventilator dependence. EXAM: PORTABLE CHEST 1 VIEW COMPARISON:  02/14/2020 FINDINGS: 0531 hours. Endotracheal tube tip is 2.7 cm above the base of the carina. A feeding tube passes into the stomach although the distal tip position is not included on the film. Left IJ central line tip overlies the innominate vein confluence. Stable asymmetric elevation right  hemidiaphragm. Relatively diffuse bilateral airspace disease again noted with mid and lower lung predominance. The cardio pericardial silhouette is enlarged. Telemetry leads overlie the chest. IMPRESSION: No substantial interval change in exam. Electronically Signed   By: Misty Stanley M.D.   On: 02/15/2020 09:02   DG CHEST PORT 1 VIEW  Result Date: 02/14/2020 CLINICAL DATA:  ETT EXAM: PORTABLE CHEST 1 VIEW COMPARISON:  Radiograph 02/13/2020 FINDINGS: *Endotracheal tube tip low in the trachea, 2 cm from the carina. Consider retraction 1-2 cm to the mid trachea. *Transesophageal tube tip terminates near the region of the gastric antrum/duodenal bulb. *Left upper IJ approach central venous catheter tip terminates near the left brachiocephalic-caval confluence. Persistent diffuse heterogeneous opacities throughout both lungs in a mid to lower lung predominance, not significantly changed from 1 day prior. Cardiomegaly is stable. Indistinct vascularity. No pneumothorax. Obscuration of the hemidiaphragms may suggest some layering effusion. No acute osseous or soft tissue abnormality. Telemetry leads overlie the chest. IMPRESSION: 1. Endotracheal tube tip low in the trachea, 2 cm from the carina. Consider retraction 1-2 cm to the mid trachea. 2. Stable diffuse heterogeneous opacities throughout both lungs in a mid to lower lung predominance. Could reflect a combination of edema and infection with likely layering bilateral effusions. These results will be called to the ordering clinician or representative by the Radiologist Assistant, and communication documented in the PACS or [employer]. Electronically Signed   By: Lovena Le M.D.   On: 02/14/2020 05:39   DG CHEST PORT 1 VIEW  Result Date: 02/13/2020 CLINICAL DATA:  Hypoxia EXAM: PORTABLE CHEST 1 VIEW COMPARISON:  February 12, 2020 FINDINGS: Endotracheal tube tip is 1.7 cm above the carina. Enteric tube tip is below the diaphragm. Central catheter tip is  in the left innominate vein near the junction with the superior vena cava. No pneumothorax. There is airspace opacity in both mid and lower lung regions with small pleural effusions bilaterally. Heart is enlarged with pulmonary vascularity normal, stable. No adenopathy. No bone lesions. IMPRESSION: Tube and catheter positions as described without pneumothorax. Persistent airspace opacity in the mid and lower lung regions with bilateral pleural effusions. Cardiomegaly. Overall appearance raises concern for a degree of congestive heart failure. There may well be superimposed pneumonia in the lung bases. Both edema and pneumonia may present concurrently. Electronically Signed   By: Lowella Grip III M.D.   On: 02/13/2020 08:03   DG CHEST PORT 1 VIEW  Result Date: 02/12/2020 CLINICAL DATA:  Endotracheal tube placement. EXAM: PORTABLE CHEST 1 VIEW COMPARISON:  02/11/2020. FINDINGS: Endotracheal tube and feeding tube in stable position. Left IJ line stable position. Cardiomegaly. Low lung volumes with bibasilar atelectasis. Diffuse bilateral pulmonary infiltrates/edema again noted. Small bilateral pleural effusions again noted. Chest is unchanged from prior exam. No pneumothorax. IMPRESSION: 1. Lines and tubes in stable position. 2. Cardiomegaly with persistent bilateral pulmonary infiltrates/edema and small bilateral pleural effusions. 3. Low lung volumes with bibasilar atelectasis. Chest is unchanged from prior exam. Electronically Signed   By: Plato   On: 02/12/2020  05:21   DG CHEST PORT 1 VIEW  Result Date: 02/11/2020 CLINICAL DATA:  Hypoxia EXAM: PORTABLE CHEST 1 VIEW COMPARISON:  February 08, 2020 FINDINGS: Endotracheal tube tip is 3.0 cm above the carina. Enteric tube tip is below the diaphragm. Central catheter tip is in the superior vena cava slightly beyond the junction with the left innominate vein. No pneumothorax. There is cardiomegaly with pulmonary venous hypertension. There is  multifocal airspace opacity throughout the lungs bilaterally with equivocal pleural effusions bilaterally. No bone lesions. No adenopathy appreciable. IMPRESSION: Tube and catheter positions as described without pneumothorax. Cardiomegaly with pulmonary vascular congestion. Small pleural effusions. Multifocal airspace opacity may represent pulmonary edema or multifocal pneumonia. Both entities may be present concurrently. Appearance overall similar to recent study. Electronically Signed   By: Lowella Grip III M.D.   On: 02/11/2020 08:51   DG Chest Port 1 View  Result Date: 02/07/2020 CLINICAL DATA:  Acute respiratory failure. Pneumonia due to COVID-19. EXAM: PORTABLE CHEST 1 VIEW COMPARISON:  Radiograph yesterday. FINDINGS: Progressive heterogeneous bilateral airspace opacities, confluent in the lower lobes. Heart size grossly normal but obscured by adjacent airspace disease. Overall low lung volumes. No evidence of pneumomediastinum or pneumothorax. Soft tissue attenuation from habitus limits assessment. IMPRESSION: Bilateral COVID pneumonia which is progressed from yesterday. Low lung volumes persist. Electronically Signed   By: Keith Rake M.D.   On: 02/07/2020 15:36   DG Chest Port 1 View  Result Date: 02/06/2020 CLINICAL DATA:  Sepsis.  Encephalopathy. EXAM: PORTABLE CHEST 1 VIEW COMPARISON:  None. FINDINGS: Multifocal airspace opacity throughout both lungs. Small pleural effusions. No pneumothorax. IMPRESSION: Multifocal bilateral airspace opacities concerning for multifocal infection. Electronically Signed   By: Ulyses Jarred M.D.   On: 02/06/2020 19:36   DG Abd Portable 1V  Result Date: 02/29/2020 CLINICAL DATA:  Feeding tube placement EXAM: PORTABLE ABDOMEN - 1 VIEW COMPARISON:  1 day prior FINDINGS: Feeding tube terminates at the distal stomach or entering the duodenal bulb. Contrast within normal caliber colon. Right hemidiaphragm elevation. Bibasilar airspace disease. IMPRESSION:  Feeding tube at the distal stomach or duodenal bulb. Electronically Signed   By: Abigail Miyamoto M.D.   On: 02/29/2020 12:03   DG Abd Portable 1V  Result Date: 02/28/2020 CLINICAL DATA:  OG tube placement. EXAM: PORTABLE ABDOMEN - 1 VIEW COMPARISON:  None. FINDINGS: Small bore feeding tube is noted with tip overlying the duodenum versus distal stomach. No dilated bowel loops are noted within the mid-UPPER abdomen. Contrast is noted within the colon, from swallowing function study performed yesterday. IMPRESSION: Small bore feeding tube with tip overlying the duodenum/distal stomach. No dilated bowel loops identified on this exam. Electronically Signed   By: Margarette Canada M.D.   On: 02/28/2020 17:55   DG Swallowing Func-Speech Pathology  Result Date: 02/27/2020 Objective Swallowing Evaluation: Type of Study: MBS-Modified Barium Swallow Study  Patient Details Name: IZETTA SAKAMOTO MRN: [id number] Date of Birth: 1980/11/13 Today's Date: 02/27/2020 Time: SLP Start Time (ACUTE ONLY): 1358 -SLP Stop Time (ACUTE ONLY): 1420 SLP Time Calculation (min) (ACUTE ONLY): 22 min Past Medical History: No past medical history on file. Past Surgical History: No past surgical history on file. HPI: Pt is a 40 year old female with medical history significant for obesity.  She presented to [facility] ED on 12/15 with 24 to 48 hours of AMS following exposure to her COVID+ son. CXR showed diffuse bilateral airspace disease, she was confused and agitated. Physical restraints applied on admission and Precedex infusion  given.  She required 100% nonrebreather mask and was transferred to [facility] for further intervention. CXR 12/29: Improved bilateral ventilation with residual confluent bibasilar opacity. CT head negative. ETT 12/17-trach 12/28. Stat CT head completed on 1/3 due to increased weakness noted by PT and was negative.  No data recorded Assessment / Plan / Recommendation CHL IP CLINICAL IMPRESSIONS 02/27/2020 Clinical  Impression Pt demonstrates a moderate oropharyngeal dysphagia likely secondry to prolonged intubation with decreased glottic closure. Pt has adequate oral phase (though no upper dentition) and timely swallow, though there is silent aspiration during the swallow with thin and nectar thick liquids regardless of bolus size and despite a chin tuck. Pt was not wearing her PMSV during this exam (pt COVID positive and PMSV left in room) and Cortrak was also in place. Suspect that pt may be able to quickly upgrade to nectar when cortrak comes out and pt has PMSV in place to aid in cough/throat clear response. For now pt able to initaite a dys 2/honey thick diet. WIll f/u for tolerance. SLP Visit Diagnosis Dysphagia, unspecified (R13.10) Attention and concentration deficit following -- Frontal lobe and executive function deficit following -- Impact on safety and function Moderate aspiration risk   CHL IP TREATMENT RECOMMENDATION 02/27/2020 Treatment Recommendations Therapy as outlined in treatment plan below   Prognosis 02/27/2020 Prognosis for Safe Diet Advancement Good Barriers to Reach Goals -- Barriers/Prognosis Comment -- CHL IP DIET RECOMMENDATION 02/27/2020 SLP Diet Recommendations Dysphagia 2 (Fine chop) solids;Honey thick liquids Liquid Administration via Cup;Straw Medication Administration Whole meds with puree Compensations Slow rate;Small sips/bites Postural Changes Seated upright at 90 degrees   CHL IP OTHER RECOMMENDATIONS 02/27/2020 Recommended Consults -- Oral Care Recommendations -- Other Recommendations Place PMSV during PO intake;Have oral suction available   CHL IP FOLLOW UP RECOMMENDATIONS 02/27/2020 Follow up Recommendations Inpatient Rehab   CHL IP FREQUENCY AND DURATION 02/27/2020 Speech Therapy Frequency (ACUTE ONLY) min 2x/week Treatment Duration 2 weeks      CHL IP ORAL PHASE 02/27/2020 Oral Phase Impaired Oral - Pudding Teaspoon -- Oral - Pudding Cup -- Oral - Honey Teaspoon WFL Oral - Honey Cup WFL Oral -  Nectar Teaspoon WFL Oral - Nectar Cup WFL Oral - Nectar Straw WFL Oral - Thin Teaspoon -- Oral - Thin Cup WFL Oral - Thin Straw WFL Oral - Puree WFL Oral - Mech Soft -- Oral - Regular -- Oral - Multi-Consistency -- Oral - Pill -- Oral Phase - Comment --  CHL IP PHARYNGEAL PHASE 02/27/2020 Pharyngeal Phase Impaired Pharyngeal- Pudding Teaspoon -- Pharyngeal -- Pharyngeal- Pudding Cup -- Pharyngeal -- Pharyngeal- Honey Teaspoon -- Pharyngeal -- Pharyngeal- Honey Cup WFL Pharyngeal -- Pharyngeal- Nectar Teaspoon -- Pharyngeal -- Pharyngeal- Nectar Cup Penetration/Aspiration during swallow;Reduced airway/laryngeal closure Pharyngeal Material enters airway, passes BELOW cords without attempt by patient to eject out (silent aspiration) Pharyngeal- Nectar Straw Penetration/Aspiration during swallow;Reduced airway/laryngeal closure Pharyngeal Material enters airway, passes BELOW cords without attempt by patient to eject out (silent aspiration) Pharyngeal- Thin Teaspoon -- Pharyngeal -- Pharyngeal- Thin Cup Penetration/Aspiration during swallow Pharyngeal Material enters airway, passes BELOW cords without attempt by patient to eject out (silent aspiration) Pharyngeal- Thin Straw Penetration/Aspiration during swallow Pharyngeal Material enters airway, passes BELOW cords without attempt by patient to eject out (silent aspiration) Pharyngeal- Puree WFL Pharyngeal -- Pharyngeal- Mechanical Soft WFL Pharyngeal -- Pharyngeal- Regular -- Pharyngeal -- Pharyngeal- Multi-consistency -- Pharyngeal -- Pharyngeal- Pill -- Pharyngeal -- Pharyngeal Comment --  No flowsheet data found. DeBlois, Katherene Ponto 02/27/2020, 2:47  PM              EEG adult  Result Date: 02/17/2020 Lora Havens, MD     02/17/2020  3:05 PM Patient Name: AERIN DELANY MRN: [id number] Epilepsy Attending: Lora Havens Referring Physician/Provider: Dr Brand Males Date: 02/17/2020 Duration: 24.30 mins Patient history: 40yo F with ams in setting of  sepsis and Covid. EEG to evaluate for seizure Level of alertness:  comatose AEDs during EEG study: Versed Technical aspects: This EEG study was done with scalp electrodes positioned according to the 10-20 International system of electrode placement. Electrical activity was acquired at a sampling rate of _0  and reviewed with a high frequency filter of _1  and a low frequency filter of _2 . EEG data were recorded continuously and digitally stored. Description: EEG showed continuous generalized 3 to 6 Hz theta-delta slowing admixed with an excessive amount of 15 to 18 Hz beta activity distributed symmetrically and diffusely.  Hyperventilation and photic stimulation were not performed.   ABNORMALITY -Excessive beta, generalized -Continuous slow, generalized IMPRESSION: This study is suggestive of severe diffuse encephalopathy, nonspecific etiology but likely related to sedation. No seizures or epileptiform discharges were seen throughout the recording. Lora Havens   ECHOCARDIOGRAM COMPLETE  Result Date: 02/12/2020    ECHOCARDIOGRAM REPORT   Patient Name:   DESIRRE EICKHOFF Date of Exam: 02/12/2020 Medical Rec #:  [id number]        Height:       63.0 in Accession #:    [id number]       Weight:       240.5 lb Date of Birth:  Apr 04, 1980        BSA:          2.091 m Patient Age:    67 years         BP:           99/70 mmHg Patient Gender: F                HR:           54 bpm. Exam Location:  Inpatient Procedure: 2D Echo, Cardiac Doppler and Color Doppler Indications:    Acute respiratory distress R06.03  History:        Patient has no prior history of Echocardiogram examinations.  Sonographer:    Clayton Lefort RDCS (AE) Referring Phys: [id number] [facility]  Sonographer Comments: Image acquisition challenging due to patient body habitus. IMPRESSIONS  1. Left ventricular ejection fraction, by estimation, is 55 to 60%. The left ventricle has normal function. The left ventricle has no regional wall motion  abnormalities. Left ventricular diastolic parameters were normal.  2. Right ventricular systolic function is normal. The right ventricular size is normal. The estimated right ventricular systolic pressure is 83.4 mmHg.  3. The mitral valve is grossly normal. Trivial mitral valve regurgitation.  4. The aortic valve is tricuspid. Aortic valve regurgitation is not visualized.  5. The inferior vena cava is normal in size with greater than 50% respiratory variability, suggesting right atrial pressure of 3 mmHg. Conclusion(s)/Recommendation(s): Normal biventricular function without evidence of hemodynamically significant valvular heart disease. FINDINGS  Left Ventricle: Left ventricular ejection fraction, by estimation, is 55 to 60%. The left ventricle has normal function. The left ventricle has no regional wall motion abnormalities. The left ventricular internal cavity size was normal in size. There is  no left ventricular hypertrophy. Left ventricular diastolic parameters were normal. Right Ventricle: The right ventricular size is normal.  No increase in right ventricular wall thickness. Right ventricular systolic function is normal. The tricuspid regurgitant velocity is 2.26 m/s, and with an assumed right atrial pressure of 3 mmHg, the estimated right ventricular systolic pressure is 51.8 mmHg. Left Atrium: Left atrial size was normal in size. Right Atrium: Right atrial size was normal in size. Pericardium: There is no evidence of pericardial effusion. Mitral Valve: The mitral valve is grossly normal. Trivial mitral valve regurgitation. Tricuspid Valve: The tricuspid valve is grossly normal. Tricuspid valve regurgitation is trivial. Aortic Valve: The aortic valve is tricuspid. Aortic valve regurgitation is not visualized. Aortic valve mean gradient measures 3.0 mmHg. Aortic valve peak gradient measures 5.8 mmHg. Aortic valve area, by VTI measures 2.60 cm. Pulmonic Valve: The pulmonic valve was grossly normal. Pulmonic  valve regurgitation is trivial. Aorta: The aortic root and ascending aorta are structurally normal, with no evidence of dilitation. Venous: The inferior vena cava is normal in size with greater than 50% respiratory variability, suggesting right atrial pressure of 3 mmHg. IAS/Shunts: No atrial level shunt detected by color flow Doppler.  LEFT VENTRICLE PLAX 2D LVIDd:         5.20 cm  Diastology LVIDs:         3.80 cm  LV e' medial:    9.90 cm/s LV PW:         1.30 cm  LV E/e' medial:  7.5 LV IVS:        1.00 cm  LV e' lateral:   10.10 cm/s LVOT diam:     2.20 cm  LV E/e' lateral: 7.4 LV SV:         67 LV SV Index:   32 LVOT Area:     3.80 cm  RIGHT VENTRICLE             IVC RV Basal diam:  2.60 cm     IVC diam: 1.10 cm RV S prime:     11.10 cm/s TAPSE (M-mode): 1.9 cm LEFT ATRIUM             Index       RIGHT ATRIUM           Index LA diam:        2.90 cm 1.39 cm/m  RA Area:     14.60 cm LA Vol (A2C):   49.8 ml 23.81 ml/m RA Volume:   35.90 ml  17.17 ml/m LA Vol (A4C):   37.8 ml 18.07 ml/m LA Biplane Vol: 46.4 ml 22.19 ml/m  AORTIC VALVE AV Area (Vmax):    2.65 cm AV Area (Vmean):   2.80 cm AV Area (VTI):     2.60 cm AV Vmax:           120.00 cm/s AV Vmean:          76.100 cm/s AV VTI:            0.259 m AV Peak Grad:      5.8 mmHg AV Mean Grad:      3.0 mmHg LVOT Vmax:         83.60 cm/s LVOT Vmean:        56.100 cm/s LVOT VTI:          0.177 m LVOT/AV VTI ratio: 0.68  AORTA Ao Root diam: 3.30 cm Ao Asc diam:  2.60 cm MITRAL VALVE               TRICUSPID VALVE MV Area (PHT): 3.21 cm  TR Peak grad:   20.4 mmHg MV Decel Time: 236 msec    TR Vmax:        226.00 cm/s MV E velocity: 74.60 cm/s MV A velocity: 40.70 cm/s  SHUNTS MV E/A ratio:  1.83        Systemic VTI:  0.18 m                            Systemic Diam: 2.20 cm Lyman Bishop MD Electronically signed by Lyman Bishop MD Signature Date/Time: 02/12/2020/6:17:29 PM    Final    CT HEAD CODE STROKE WO CONTRAST`  Result Date: 02/25/2020 CLINICAL  DATA:  Code stroke.  Left-sided weakness EXAM: CT HEAD WITHOUT CONTRAST TECHNIQUE: Contiguous axial images were obtained from the base of the skull through the vertex without intravenous contrast. COMPARISON:  None. FINDINGS: Brain: There is no acute intracranial hemorrhage, mass effect, or edema. Gray-white differentiation is preserved. No extra-axial collection. Ventricles and sulci are normal in size and configuration. Vascular: No hyperdense vessel. Skull: Unremarkable. Sinuses/Orbits: Nonspecific paranasal sinus opacification primarily involving right maxillary and both sphenoid sinuses. There is thinning or dehiscence of the inferior right maxillary sinus wall. Orbits are unremarkable. Other: Minimal mastoid opacification. ASPECTS ([place] Stroke Program Early CT Score) - Ganglionic level infarction (caudate, lentiform nuclei, internal capsule, insula, M1-M3 cortex): 7 - Supraganglionic infarction (M4-M6 cortex): 3 Total score (0-10 with 10 being normal): 10 IMPRESSION: There is no acute intracranial hemorrhage or evidence of acute infarction. ASPECT score is 10. These results were communicated to Dr. Lorrin Goodell at 12:15 pm on 02/25/2020 by text page via the [facility] messaging system. Electronically Signed   By: Macy Mis M.D.   On: 02/25/2020 12:36   VAS [place] LOWER EXTREMITY VENOUS (DVT)  Result Date: 02/18/2020  Lower Venous DVT Study Indications: Covid-19, elevated D-Dimer.  Comparison Study: No prior study Performing Technologist: Sharion Dove RVS  Examination Guidelines: A complete evaluation includes B-mode imaging, spectral Doppler, color Doppler, and power Doppler as needed of all accessible portions of each vessel. Bilateral testing is considered an integral part of a complete examination. Limited examinations for reoccurring indications may be performed as noted. The reflux portion of the exam is performed with the patient in reverse Trendelenburg.   +---------+---------------+---------+-----------+----------+--------------+ RIGHT    CompressibilityPhasicitySpontaneityPropertiesThrombus Aging +---------+---------------+---------+-----------+----------+--------------+ CFV      Full           Yes      Yes                                 +---------+---------------+---------+-----------+----------+--------------+ SFJ      Full                                                        +---------+---------------+---------+-----------+----------+--------------+ FV Prox  Full                                                        +---------+---------------+---------+-----------+----------+--------------+ FV Mid   Full                                                        +---------+---------------+---------+-----------+----------+--------------+  FV DistalFull                                                        +---------+---------------+---------+-----------+----------+--------------+ PFV      Full                                                        +---------+---------------+---------+-----------+----------+--------------+ POP      Full           Yes      Yes                                 +---------+---------------+---------+-----------+----------+--------------+ PTV      Full                                                        +---------+---------------+---------+-----------+----------+--------------+ PERO     Full                                                        +---------+---------------+---------+-----------+----------+--------------+   +---------+---------------+---------+-----------+----------+--------------+ LEFT     CompressibilityPhasicitySpontaneityPropertiesThrombus Aging +---------+---------------+---------+-----------+----------+--------------+ CFV      Full           Yes      Yes                                  +---------+---------------+---------+-----------+----------+--------------+ SFJ      Full                                                        +---------+---------------+---------+-----------+----------+--------------+ FV Prox  Full                                                        +---------+---------------+---------+-----------+----------+--------------+ FV Mid   Full                                                        +---------+---------------+---------+-----------+----------+--------------+ FV DistalFull                                                        +---------+---------------+---------+-----------+----------+--------------+  PFV      Full                                                        +---------+---------------+---------+-----------+----------+--------------+ POP      Full           Yes      Yes                                 +---------+---------------+---------+-----------+----------+--------------+ PTV      Full                                                        +---------+---------------+---------+-----------+----------+--------------+ PERO     Full                                                        +---------+---------------+---------+-----------+----------+--------------+     Summary: BILATERAL: - No evidence of deep vein thrombosis seen in the lower extremities, bilaterally. -No evidence of popliteal cyst, bilaterally.   *See table(s) above for measurements and observations. Electronically signed by Ruta Hinds MD on 02/18/2020 at 5:24:02 PM.    Final

## 2020-03-01 NOTE — Plan of Care (Signed)
  Problem: Education: Goal: Knowledge of General Education information will improve Description: Including pain rating scale, medication(s)/side effects and non-pharmacologic comfort measures Outcome: Progressing   Problem: Health Behavior/Discharge Planning: Goal: Ability to manage health-related needs will improve Outcome: Progressing   Problem: Clinical Measurements: Goal: Ability to maintain clinical measurements within normal limits will improve Outcome: Progressing Goal: Will remain free from infection Outcome: Progressing Goal: Diagnostic test results will improve Outcome: Progressing Goal: Respiratory complications will improve Outcome: Progressing Goal: Cardiovascular complication will be avoided Outcome: Progressing   Problem: Activity: Goal: Risk for activity intolerance will decrease Outcome: Progressing   Problem: Nutrition: Goal: Adequate nutrition will be maintained Outcome: Progressing   Problem: Coping: Goal: Level of anxiety will decrease Outcome: Progressing   Problem: Elimination: Goal: Will not experience complications related to bowel motility Outcome: Progressing Goal: Will not experience complications related to urinary retention Outcome: Progressing   Problem: Pain Managment: Goal: General experience of comfort will improve Outcome: Progressing   Problem: Safety: Goal: Ability to remain free from injury will improve Outcome: Progressing   Problem: Skin Integrity: Goal: Risk for impaired skin integrity will decrease Outcome: Progressing   Problem: Education: Goal: Knowledge of risk factors and measures for prevention of condition will improve Outcome: Progressing   Problem: Coping: Goal: Psychosocial and spiritual needs will be supported Outcome: Progressing   Problem: Respiratory: Goal: Will maintain a patent airway Outcome: Progressing Goal: Complications related to the disease process, condition or treatment will be avoided or  minimized Outcome: Progressing   Problem: Safety: Goal: Non-violent Restraint(s) Outcome: Progressing

## 2020-03-02 DIAGNOSIS — U071 COVID-19: Secondary | ICD-10-CM | POA: Diagnosis not present

## 2020-03-02 DIAGNOSIS — J9601 Acute respiratory failure with hypoxia: Secondary | ICD-10-CM | POA: Diagnosis not present

## 2020-03-02 LAB — GLUCOSE, CAPILLARY
Glucose-Capillary: 111 mg/dL — ABNORMAL HIGH (ref 70–99)
Glucose-Capillary: 111 mg/dL — ABNORMAL HIGH (ref 70–99)
Glucose-Capillary: 122 mg/dL — ABNORMAL HIGH (ref 70–99)
Glucose-Capillary: 126 mg/dL — ABNORMAL HIGH (ref 70–99)
Glucose-Capillary: 128 mg/dL — ABNORMAL HIGH (ref 70–99)
Glucose-Capillary: 142 mg/dL — ABNORMAL HIGH (ref 70–99)
Glucose-Capillary: 63 mg/dL — ABNORMAL LOW (ref 70–99)
Glucose-Capillary: 97 mg/dL (ref 70–99)

## 2020-03-02 LAB — COMPREHENSIVE METABOLIC PANEL
ALT: 67 U/L — ABNORMAL HIGH (ref 0–44)
AST: 35 U/L (ref 15–41)
Albumin: 2.8 g/dL — ABNORMAL LOW (ref 3.5–5.0)
Alkaline Phosphatase: 79 U/L (ref 38–126)
Anion gap: 13 (ref 5–15)
BUN: 17 mg/dL (ref 6–20)
CO2: 23 mmol/L (ref 22–32)
Calcium: 8.3 mg/dL — ABNORMAL LOW (ref 8.9–10.3)
Chloride: 98 mmol/L (ref 98–111)
Creatinine, Ser: 0.7 mg/dL (ref 0.44–1.00)
GFR, Estimated: >60 mL/min (ref >60)
Glucose, Bld: 147 mg/dL — ABNORMAL HIGH (ref 70–99)
Potassium: 3.8 mmol/L (ref 3.5–5.1)
Sodium: 134 mmol/L — ABNORMAL LOW (ref 135–145)
Total Bilirubin: 0.6 mg/dL (ref 0.3–1.2)
Total Protein: 6.7 g/dL (ref 6.5–8.1)

## 2020-03-02 LAB — CBC
HCT: 29.5 % — ABNORMAL LOW (ref 36.0–46.0)
Hemoglobin: 9.6 g/dL — ABNORMAL LOW (ref 12.0–15.0)
MCH: 29.8 pg (ref 26.0–34.0)
MCHC: 32.5 g/dL (ref 30.0–36.0)
MCV: 91.6 fL (ref 80.0–100.0)
Platelets: 196 10*3/uL (ref 150–400)
RBC: 3.22 MIL/uL — ABNORMAL LOW (ref 3.87–5.11)
RDW: 13.5 % (ref 11.5–15.5)
WBC: 8.3 10*3/uL (ref 4.0–10.5)
nRBC: 0.2 % (ref 0.0–0.2)

## 2020-03-02 MED ORDER — ALPRAZOLAM 0.5 MG PO TABS
1.0000 mg | ORAL_TABLET | Freq: Every evening | ORAL | Status: DC | PRN
  Administered 2020-03-02: 1 mg via ORAL
  Filled 2020-03-02: qty 2

## 2020-03-02 MED ORDER — PANTOPRAZOLE SODIUM 40 MG PO TBEC
40.0000 mg | DELAYED_RELEASE_TABLET | Freq: Two times a day (BID) | ORAL | Status: DC
  Administered 2020-03-02 – 2020-03-05 (×6): 40 mg via ORAL
  Filled 2020-03-02 (×6): qty 1

## 2020-03-02 MED ORDER — SIMETHICONE 80 MG PO CHEW
80.0000 mg | CHEWABLE_TABLET | Freq: Four times a day (QID) | ORAL | Status: DC | PRN
  Administered 2020-03-02: 80 mg via ORAL
  Filled 2020-03-02: qty 1

## 2020-03-02 MED ORDER — CLONAZEPAM 0.5 MG PO TABS
2.0000 mg | ORAL_TABLET | Freq: Every day | ORAL | Status: DC | PRN
  Administered 2020-03-02: 2 mg via ORAL

## 2020-03-02 NOTE — Progress Notes (Signed)
PROGRESS NOTE                                                                             PROGRESS NOTE                                                                                                                                                                                                             Patient Demographics:    Belinda [name], is a 40 y.o. female, DOB - 01/04/1981, EXN:[id number]  Outpatient Primary MD for the patient is Kathyrn Drown, MD    LOS - 25  Admit date - 02/06/2020    Chief Complaint  Patient presents with   Altered Mental Status       Brief Narrative    40 year old white female, only medical history is obesity.  Presented to [facility] ER 12/15 w/ ARDS and acute metabolic encephalopathy 2/2 COVID.  Patient was admitted to the ICU.  She had to be intubated.  She was given treatment for COVID-19.  There was also concern for withdrawal since patient does have a history of opioid dependence.  She apparently uses Suboxone which is obtained illegally.  Patient was subsequently started on methadone and scheduled hydromorphone by critical care medicine.  Subsequently underwent tracheostomy placement.  Weaned off of ventilator..  Remains on tube feedings at night.    Subjective:   C/o not being able to sleep--only xanax helps   Assessment  & Plan :   Acute hypoxic respiratory failure/ARDS/status post tracheostomy  Patient was admitted to the intensive care unit initially.  Was placed on the ventilator.  Subsequently underwent tracheostomy on 12/28.  Has been weaned off of ventilator.  Tolerating trach collar.  Patient has completed course of antibacterials.  Tracheostomy care per pulmonology- toevaluate tracheostomy on Monday.  Pneumonia due to COVID-19  Patient is stable from a COVID-19 standpoint. She is status post treatment with Remdesivir and steroids. She also completed course of baricitinib.  Covid  isolation discontinued from 02/28/2020.   Klebsiella urinary tract  infection with sepsis syndrome with or without aspiration pneumonia  - unasyn ended 02/11/20 - Staph NOS VAP on 12/26 - Resp Cx MSSA on respiratory culture, complete cefazolin 02/23/18/2022 Remains stable from an infectious disease standpoint. Currently not on any antibacterials.  Acute metabolic encephalopathy -appears resolved  History of opioid dependence Apparently uses alprazolam on a chronic basis- per PDMP database 1 mg QID. Has history of opioid overdose in 2018.  As per her father she has been illegally obtaining Suboxone and has been consuming that at home.  Patient was started on scheduled methadone on 12/25 by PCCM. This was mainly as patient was quite encephalopathic and there was concern that she might be going through opioid withdrawal. Noted to be on scheduled hydromorphone as well. We'll slowly start weaning her off of these medications. We will further decrease the dose of hydromorphone today. We will also cut back on methadone.  Volume overload ECHO normal 02/12/20. Patient on daily furosemide.  Monitor ins and outs daily weights. Change furosemide to oral. Replace potassium. -daily labs  Hypophosphatemia Replete as needed  Anemia of critical illness Hemoglobin stable.  No evidence of overt bleeding.  Mild transaminitis Likely due to acute illness and COVID-19. Stable for the most part. Hepatitis panel is negative.  Nutrition/dysphagia Speech therapy is following. Advance to dysphagia two diet. Oral intake appears to be improving. Continue tube feedings for now. On nocturnal tube feedings to help her recover her appetite. Core track feeding tube was replaced 1/7.  Prediabetes with Hyperglycemia HbA1c 5.8.  Elevated CBGs due to tube feedings.  Continue with SSI and Levemir as long as she is on tube feedings. CBGs noted to be lower than usual. Cut back on dose of Levemir.  Hypernatrmia Improved  after she was started on free water and now low so will d/c free water  Concern for pituitary lesion This was incidentally noted on MRI brain.  Will need a dedicated pituitary protocol MRI when patient is more stable.  Thyroid function tests were normal: TSH 3.45 and free T4 1.03.   Luteinizing hormone level noted to be 0.6.  Difficult to clinically interpret this level at this time.   Prolactin level noted to be 115.  Normal range is 4.8-23.3.  Patient however noted to be on Risperdal which can increase the level of prolactin.   IGF-I/Somatomedin-C level was 121 which is in the normal range. ACTH level normal.  Patient will eventually need to be seen by endocrinology as an outpatient.  Obesity Estimated body mass index is 37.8 kg/m as calculated from the following:   Height as of this encounter: 5' 3"  (1.6 m).   Weight as of this encounter: 96.8 kg.     DVT prophylaxis: Lovenox CODE STATUS: Full code Disposition: Will likely need to go to rehabilitation when medically stable.  Status is: Inpatient  Remains inpatient appropriate because:IV treatments appropriate due to intensity of illness or inability to take PO and Inpatient level of care appropriate due to severity of illness   Dispo: The patient is from: Home              Anticipated d/c is to: CIR              Anticipated d/c date is:1-2day (pending what CIR Can do for patient)                    Consults  :  PCCM, Neurology  Procedures  :  Lurline Idol  Inpatient Medications  Scheduled Meds:  chlorhexidine gluconate (MEDLINE KIT)  15 mL Mouth Rinse BID   Chlorhexidine Gluconate Cloth  6 each Topical Daily   enoxaparin (LOVENOX) injection  0.5 mg/kg Subcutaneous Q24H   feeding supplement  237 mL Oral BID BM   feeding supplement (PROSource TF)  45 mL Per Tube TID   feeding supplement (VITAL 1.5 CAL)  1,000 mL Per Tube Q24H   free water  150 mL Per Tube Q6H   furosemide  20 mg Oral Daily   HYDROmorphone   2 mg Oral TID   insulin aspart  0-20 Units Subcutaneous Q4H   insulin detemir  5 Units Subcutaneous BID   mouth rinse  15 mL Mouth Rinse 10 times per day   melatonin  3 mg Oral QHS   methadone  15 mg Oral Q12H   multivitamin with minerals  1 tablet Oral Daily   pantoprazole  40 mg Oral BID AC   polyethylene glycol  17 g Oral Daily   risperiDONE  3 mg Oral QHS   thiamine  100 mg Oral Daily   Continuous Infusions:  sodium chloride Stopped (02/25/20 0849)   lactated ringers 10 mL/hr at 02/08/20 1118   PRN Meds:.sodium chloride, acetaminophen, ALPRAZolam, clonazePAM, docusate, guaiFENesin, ipratropium-albuterol, ondansetron (ZOFRAN) IV, Resource ThickenUp Clear, simethicone  Antibiotics  :    Anti-infectives (From admission, onward)   Start     Dose/Rate Route Frequency Ordered Stop   02/18/20 1200  ceFAZolin (ANCEF) IVPB 2g/100 mL premix        2 g 200 mL/hr over 30 Minutes Intravenous Every 8 hours 02/18/20 1104 02/23/20 2044   02/17/20 2000  vancomycin (VANCOCIN) IVPB 1000 mg/200 mL premix  Status:  Discontinued        1,000 mg 200 mL/hr over 60 Minutes Intravenous Every 8 hours 02/17/20 1146 02/18/20 1104   02/17/20 1345  vancomycin (VANCOREADY) IVPB 2000 mg/400 mL        2,000 mg 200 mL/hr over 120 Minutes Intravenous  Once 02/17/20 1257 02/17/20 1752   02/17/20 1245  vancomycin (VANCOCIN) 2,000 mg in sodium chloride 0.9 % 500 mL IVPB  Status:  Discontinued        2,000 mg 250 mL/hr over 120 Minutes Intravenous  Once 02/17/20 1146 02/17/20 1445   02/07/20 1600  Ampicillin-Sulbactam (UNASYN) 3 g in sodium chloride 0.9 % 100 mL IVPB        3 g 200 mL/hr over 30 Minutes Intravenous Every 6 hours 02/07/20 1513 02/11/20 2145   02/07/20 1000  remdesivir 100 mg in sodium chloride 0.9 % 100 mL IVPB        100 mg 200 mL/hr over 30 Minutes Intravenous Daily 02/06/20 2132 02/10/20 1103   02/07/20 0800  vancomycin (VANCOCIN) IVPB 750 mg/150 ml premix  Status:  Discontinued         750 mg 150 mL/hr over 60 Minutes Intravenous Every 12 hours 02/06/20 2023 02/07/20 1510   02/07/20 0400  ceFEPIme (MAXIPIME) 2 g in sodium chloride 0.9 % 100 mL IVPB  Status:  Discontinued        2 g 200 mL/hr over 30 Minutes Intravenous Every 8 hours 02/06/20 2023 02/07/20 1510   02/06/20 2145  remdesivir 100 mg in sodium chloride 0.9 % 100 mL IVPB        100 mg 200 mL/hr over 30 Minutes Intravenous Every 30 min 02/06/20 2132 02/06/20 2305   02/06/20 1915  vancomycin (VANCOREADY) IVPB 2000 mg/400 mL  2,000 mg 200 mL/hr over 120 Minutes Intravenous  Once 02/06/20 1907 02/06/20 2250   02/06/20 1845  ceFEPIme (MAXIPIME) 2 g in sodium chloride 0.9 % 100 mL IVPB        2 g 200 mL/hr over 30 Minutes Intravenous  Once 02/06/20 1834 02/06/20 2056   02/06/20 1845  metroNIDAZOLE (FLAGYL) IVPB 500 mg        500 mg 100 mL/hr over 60 Minutes Intravenous  Once 02/06/20 1834 02/06/20 2204   02/06/20 1845  vancomycin (VANCOCIN) IVPB 1000 mg/200 mL premix  Status:  Discontinued        1,000 mg 200 mL/hr over 60 Minutes Intravenous  Once 02/06/20 1834 02/06/20 [place] DO on 03/02/2020 at 1:57 PM  To page go to [email or web address]        Objective:   Vitals:   03/02/20 0858 03/02/20 0900 03/02/20 1027 03/02/20 1030  BP:   108/76   Pulse: (!) 108 (!) 105 (!) 115 (!) 114  Resp: (!) 21 20 (!) 26 (!) 21  Temp:   99.1 F (37.3 C)   TempSrc:   Oral   SpO2: 93% 93% 97% 95%  Weight:      Height:        Wt Readings from Last 3 Encounters:  03/02/20 96.8 kg     Intake/Output Summary (Last 24 hours) at 03/02/2020 1357 Last data filed at 03/02/2020 1300 Gross per 24 hour  Intake --  Output 1700 ml  Net -1700 ml     Physical Exam   General: Appearance:    Obese female in no acute distress     Lungs:     Sputum production but patient able to cough up, respirations unlabored  Heart:    Tachycardic.   MS:   All extremities are intact.   Neurologic:   Awake, alert,  oriented x 3. No apparent focal neurological           defect.      Data Review:    CBC Recent Labs  Lab 02/27/20 0350 02/28/20 0614 02/29/20 0029 03/01/20 0044 03/02/20 0041  WBC 7.9 7.7 8.2 9.3 8.3  HGB 11.2* 10.6* 10.5* 10.2* 9.6*  HCT 36.5 32.6* 33.1* 30.5* 29.5*  PLT 251 248 223 215 196  MCV 97.6 95.9 94.6 92.4 91.6  MCH 29.9 31.2 30.0 30.9 29.8  MCHC 30.7 32.5 31.7 33.4 32.5  RDW 13.7 13.7 13.7 13.5 13.5    Recent Labs  Lab 02/26/20 0400 02/27/20 0350 02/28/20 0614 02/29/20 0029 03/01/20 0044 03/02/20 0041  NA 146* 145 143 136 134* 134*  K 4.1 4.0 4.0 3.5 3.7 3.8  CL 104 103 98 95* 96* 98  CO2 29 29 30 28 26 23   GLUCOSE 189* 97 96 105* 128* 147*  BUN 49* 48* 39* 28* 24* 17  CREATININE 0.81 0.73 0.79 0.75 0.67 0.70  CALCIUM 9.5 9.6 9.2 8.9 8.5* 8.3*  AST  --  83* 62* 63* 45* 35  ALT  --  145* 116* 109* 88* 67*  ALKPHOS  --  104 100 94 88 79  BILITOT  --  0.8 0.8 1.0 0.8 0.6  ALBUMIN  --  3.3* 3.1* 3.0* 2.7* 2.8*  MG 2.7*  --   --   --  2.2  --   TSH  --   --  3.450  --   --   --       Lab Results  Component Value Date   HGBA1C 5.8 (H) 02/08/2020    Micro Results No results found for this or any previous visit (from the past 240 hour(s)).  Radiology Reports DG Chest 1 View  Result Date: 02/08/2020 CLINICAL DATA:  OG tube placement. EXAM: CHEST  1 VIEW COMPARISON:  Chest x-ray 02/07/2020. FINDINGS: 539 hours. Bilateral airspace disease noted, left greater than right. Endotracheal tube tip is approximately 4.3 cm above the base of the carina. Left IJ central line tip overlies the innominate vein confluence. NG tube tip is positioned in the stomach with the proximal side port below the GE junction. Telemetry leads overlie the chest. IMPRESSION: NG tube tip is in the stomach with proximal side port of the tube below the GE junction. Electronically Signed   By: Misty Stanley M.D.   On: 02/08/2020 05:54   CT HEAD WO CONTRAST  Result Date:  02/18/2020 CLINICAL DATA:  Delirium. Altered mental status. EXAM: CT HEAD WITHOUT CONTRAST TECHNIQUE: Contiguous axial images were obtained from the base of the skull through the vertex without intravenous contrast. COMPARISON:  CT head without contrast 09/01/2017 at [facility]. FINDINGS: Brain: No acute infarct, hemorrhage, or mass lesion is present. No significant white matter lesions are present. The ventricles are of normal size. No significant extraaxial fluid collection is present. The brainstem and cerebellum are within normal limits. Vascular: No hyperdense vessel or unexpected calcification. Skull: Calvarium is intact. No focal lytic or blastic lesions are present. No significant extracranial soft tissue lesion is present. Sinuses/Orbits: Patient is intubated. Fluid level is present in the right maxillary sinus. Fluid is present in the nasopharynx. Fluid is present in the sphenoid sinuses and posterior ethmoid air cells bilaterally. Left nasogastric tube is noted. Minimal mastoid fluid is present inferiorly on both sides. IMPRESSION: 1. Normal CT appearance of the brain. 2. Fluid in the paranasal sinuses and nasopharynx likely related to intubation. 3. Minimal mastoid fluid inferiorly on both sides. Electronically Signed   By: San Morelle M.D.   On: 02/18/2020 02:16   CT Head Wo Contrast  Result Date: 02/06/2020 CLINICAL DATA:  Altered mental status.  Recent tooth extraction. EXAM: CT HEAD WITHOUT CONTRAST TECHNIQUE: Contiguous axial images were obtained from the base of the skull through the vertex without intravenous contrast. COMPARISON:  Head CT 09/01/2017 FINDINGS: Brain: No intracranial hemorrhage, mass effect, or midline shift. No hydrocephalus. The basilar cisterns are patent. No evidence of territorial infarct or acute ischemia. No extra-axial or intracranial fluid collection. Vascular: No hyperdense vessel or unexpected calcification. Skull: No fracture or focal lesion.  Sinuses/Orbits: Mucosal thickening of the right greater than left maxillary sinus. Scattered mucosal thickening throughout ethmoid air cells. Patient appears edentulous of upper teeth, presumed prior periapical lucency in the right upper molars, contiguous with the right maxillary sinus. No acute orbital abnormality. Mastoid air cells are clear. Other: None. IMPRESSION: 1. No acute intracranial abnormality. 2. Paranasal sinus disease. 3. Patient appears edentulous of upper teeth, presumed prior periapical lucency in the right upper molars, contiguous with the right maxillary sinus. CLINICAL DATA:  Altered mental status. Recent tooth extraction. EXAM: CT HEAD WITHOUT CONTRAST TECHNIQUE: Contiguous axial images were obtained from the base of the skull through the vertex without intravenous contrast. COMPARISON:  Head CT 09/01/2017 FINDINGS: Brain: No intracranial hemorrhage, mass effect, or midline shift. No hydrocephalus. The basilar cisterns are patent. No evidence of territorial infarct or acute ischemia. No extra-axial or intracranial fluid collection. Vascular: No hyperdense vessel or unexpected calcification.  Skull: No fracture or focal lesion. Sinuses/Orbits: Mucosal thickening of the right greater than left maxillary sinus. Scattered mucosal thickening throughout ethmoid air cells. Patient appears edentulous of upper teeth, presumed prior periapical lucency in the right upper molars, contiguous with the right maxillary sinus. No acute orbital abnormality. Mastoid air cells are clear. Other: None. IMPRESSION: 1. No acute intracranial abnormality. 2. Paranasal sinus mucosal thickening. 3. Patient appears edentulous of upper teeth, presumed prior periodontal disease in the right upper molars, contiguous with the right maxillary sinus. Electronically Signed   By: Keith Rake M.D.   On: 02/06/2020 22:18   MR BRAIN WO CONTRAST  Result Date: 02/25/2020 CLINICAL DATA:  Neuro deficit, acute, stroke suspected.  Additional history provided: COVID positive. EXAM: MRI HEAD WITHOUT CONTRAST TECHNIQUE: Multiplanar, multiecho pulse sequences of the brain and surrounding structures were obtained without intravenous contrast. COMPARISON:  Noncontrast head CT 02/25/2020. FINDINGS: Brain: Intermittently motion degraded exam. Most notably, there is moderate motion degradation of the axial T2/FLAIR sequence. Small focus of chronic cortical encephalomalacia within the anterolateral right frontal lobe (for instance as seen on series 7, images 14-16). 2 x 10 mm (AP x TV) T1 hyperintense lesion within the mid to posterior pituitary gland (for instance as seen on series 10, image 36). No focal parenchymal signal abnormality is identified elsewhere. There is no acute infarct. No extra-axial fluid collection. No midline shift. Vascular: Expected proximal arterial flow voids. Skull and upper cervical spine: No focal marrow lesion. Sinuses/Orbits: Visualized orbits show no acute finding. Paranasal sinus disease. Most notably, there is extensive partial opacification of the bilateral sphenoid sinuses and moderate right maxillary sinus mucosal thickening. Other: Small bilateral mastoid effusions. IMPRESSION: 1. Intermittently motion degraded exam. 2. No evidence of acute infarct. 3. Small focus of chronic cortical encephalomalacia within the anterolateral right frontal lobe. 4. 2 x 10 mm T1 hyperintense lesion within the mid-to-posterior pituitary gland. 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 However, a cystic adenoma or adenoma with prior hemorrhage cannot be excluded. Correlate with relevant laboratory values. A pituitary protocol brain MRI may be obtained for further characterization, if clinically warranted. 5. Otherwise unremarkable non-contrast MRI appearance of the brain. 6. Paranasal sinus disease, most notably severe bilateral sphenoid sinusitis. 7. Small bilateral mastoid effusions. Electronically Signed   By:  Kellie Simmering DO   On: 02/25/2020 17:45   DG Chest Port 1 View  Result Date: 02/20/2020 CLINICAL DATA:  40 year old female COVID-36. EXAM: PORTABLE CHEST 1 VIEW COMPARISON:  Portable chest 02/19/2020 and earlier. FINDINGS: Portable AP semi upright view at 0448 hours. Stable tracheostomy tube, left IJ approach central line and visible enteric tube. Larger lung volumes and improved ventilation. Largely regressed left upper lung opacity since yesterday. Residual confluent bibasilar opacity, including suspected left lower lobe collapse or consolidation. No pneumothorax. No definite pleural effusion. No acute osseous abnormality identified. Paucity of bowel gas in the upper abdomen. IMPRESSION: 1. Improved bilateral ventilation with residual confluent bibasilar opacity. 2. No new cardiopulmonary abnormality identified. 3.  Stable lines and tubes. Electronically Signed   By: Genevie Ann M.D.   On: 02/20/2020 07:03   DG Chest Port 1 View  Result Date: 02/19/2020 CLINICAL DATA:  Post tracheostomy, COVID positive EXAM: PORTABLE CHEST 1 VIEW COMPARISON:  Earlier same day FINDINGS: Tracheostomy device is present approximately 5 cm above the carina. Left IJ central line and enteric tube are again identified. Persistent bilateral pulmonary opacities similar to the prior study. Probable small pleural effusions.  No pneumothorax. Similar cardiomediastinal contours. IMPRESSION: New tracheostomy device. Otherwise stable lines and tubes. Similar bilateral pulmonary opacities. Electronically Signed   By: Macy Mis M.D.   On: 02/19/2020 16:48   DG Chest Port 1 View  Result Date: 02/19/2020 CLINICAL DATA:  Hypoxia.  Respiratory failure.  COVID-19. EXAM: PORTABLE CHEST 1 VIEW COMPARISON:  02/17/2020. FINDINGS: Endotracheal tube, feeding tube, left IJ line in stable position. Cardiomegaly with pulmonary venous congestion. Bilateral interstitial infiltrates again noted. Bibasilar atelectasis. Small bilateral pleural effusions  again noted. No pneumothorax. IMPRESSION: 1. Lines and tubes in stable position. 2. Cardiomegaly with pulmonary venous congestion. 3. Bilateral interstitial infiltrates again noted. Bibasilar atelectasis. Small bilateral pleural effusions again noted. Electronically Signed   By: Marcello Moores  Register   On: 02/19/2020 05:19   DG CHEST PORT 1 VIEW  Result Date: 02/17/2020 CLINICAL DATA:  Intubation EXAM: PORTABLE CHEST 1 VIEW COMPARISON:  02/16/2020 FINDINGS: No significant interval change in AP portable chest radiograph, with diffuse bilateral interstitial heterogeneous airspace opacity, most conspicuous at the lung bases. Mild cardiomegaly. Support apparatus is unchanged including endotracheal tube, esophagogastric tube, and left neck vascular catheter. IMPRESSION: 1. No significant interval change in AP portable chest radiograph, with diffuse bilateral interstitial heterogeneous airspace opacity, most conspicuous at the lung bases. Findings are consistent with multifocal infection, edema, and/or ARDS. 2.  Unchanged support apparatus. Electronically Signed   By: Eddie Candle M.D.   On: 02/17/2020 11:26   DG CHEST PORT 1 VIEW  Result Date: 02/16/2020 CLINICAL DATA:  Altered mental status EXAM: PORTABLE CHEST 1 VIEW COMPARISON:  Yesterday FINDINGS: Endotracheal tube with tip halfway between the clavicular heads and carina. The feeding tube at least reaches the diaphragm. Left IJ line with tip at the brachiocephalic SVC confluence. Extensive bilateral pneumonia. Cardiomegaly. No visible air leak. IMPRESSION: Stable hardware positioning and extensive pneumonia. Electronically Signed   By: Monte Fantasia M.D.   On: 02/16/2020 07:08   DG CHEST PORT 1 VIEW  Result Date: 02/15/2020 CLINICAL DATA:  COVID positive.  Ventilator dependence. EXAM: PORTABLE CHEST 1 VIEW COMPARISON:  02/14/2020 FINDINGS: 0531 hours. Endotracheal tube tip is 2.7 cm above the base of the carina. A feeding tube passes into the stomach  although the distal tip position is not included on the film. Left IJ central line tip overlies the innominate vein confluence. Stable asymmetric elevation right hemidiaphragm. Relatively diffuse bilateral airspace disease again noted with mid and lower lung predominance. The cardio pericardial silhouette is enlarged. Telemetry leads overlie the chest. IMPRESSION: No substantial interval change in exam. Electronically Signed   By: Misty Stanley M.D.   On: 02/15/2020 09:02   DG CHEST PORT 1 VIEW  Result Date: 02/14/2020 CLINICAL DATA:  ETT EXAM: PORTABLE CHEST 1 VIEW COMPARISON:  Radiograph 02/13/2020 FINDINGS: *Endotracheal tube tip low in the trachea, 2 cm from the carina. Consider retraction 1-2 cm to the mid trachea. *Transesophageal tube tip terminates near the region of the gastric antrum/duodenal bulb. *Left upper IJ approach central venous catheter tip terminates near the left brachiocephalic-caval confluence. Persistent diffuse heterogeneous opacities throughout both lungs in a mid to lower lung predominance, not significantly changed from 1 day prior. Cardiomegaly is stable. Indistinct vascularity. No pneumothorax. Obscuration of the hemidiaphragms may suggest some layering effusion. No acute osseous or soft tissue abnormality. Telemetry leads overlie the chest. IMPRESSION: 1. Endotracheal tube tip low in the trachea, 2 cm from the carina. Consider retraction 1-2 cm to the mid trachea. 2. Stable diffuse heterogeneous opacities throughout  both lungs in a mid to lower lung predominance. Could reflect a combination of edema and infection with likely layering bilateral effusions. These results will be called to the ordering clinician or representative by the Radiologist Assistant, and communication documented in the PACS or [employer]. Electronically Signed   By: Lovena Le M.D.   On: 02/14/2020 05:39   DG CHEST PORT 1 VIEW  Result Date: 02/13/2020 CLINICAL DATA:  Hypoxia EXAM: PORTABLE CHEST  1 VIEW COMPARISON:  February 12, 2020 FINDINGS: Endotracheal tube tip is 1.7 cm above the carina. Enteric tube tip is below the diaphragm. Central catheter tip is in the left innominate vein near the junction with the superior vena cava. No pneumothorax. There is airspace opacity in both mid and lower lung regions with small pleural effusions bilaterally. Heart is enlarged with pulmonary vascularity normal, stable. No adenopathy. No bone lesions. IMPRESSION: Tube and catheter positions as described without pneumothorax. Persistent airspace opacity in the mid and lower lung regions with bilateral pleural effusions. Cardiomegaly. Overall appearance raises concern for a degree of congestive heart failure. There may well be superimposed pneumonia in the lung bases. Both edema and pneumonia may present concurrently. Electronically Signed   By: Lowella Grip III M.D.   On: 02/13/2020 08:03   DG CHEST PORT 1 VIEW  Result Date: 02/12/2020 CLINICAL DATA:  Endotracheal tube placement. EXAM: PORTABLE CHEST 1 VIEW COMPARISON:  02/11/2020. FINDINGS: Endotracheal tube and feeding tube in stable position. Left IJ line stable position. Cardiomegaly. Low lung volumes with bibasilar atelectasis. Diffuse bilateral pulmonary infiltrates/edema again noted. Small bilateral pleural effusions again noted. Chest is unchanged from prior exam. No pneumothorax. IMPRESSION: 1. Lines and tubes in stable position. 2. Cardiomegaly with persistent bilateral pulmonary infiltrates/edema and small bilateral pleural effusions. 3. Low lung volumes with bibasilar atelectasis. Chest is unchanged from prior exam. Electronically Signed   By: Marcello Moores  Register   On: 02/12/2020 05:21   DG CHEST PORT 1 VIEW  Result Date: 02/11/2020 CLINICAL DATA:  Hypoxia EXAM: PORTABLE CHEST 1 VIEW COMPARISON:  February 08, 2020 FINDINGS: Endotracheal tube tip is 3.0 cm above the carina. Enteric tube tip is below the diaphragm. Central catheter tip is in the  superior vena cava slightly beyond the junction with the left innominate vein. No pneumothorax. There is cardiomegaly with pulmonary venous hypertension. There is multifocal airspace opacity throughout the lungs bilaterally with equivocal pleural effusions bilaterally. No bone lesions. No adenopathy appreciable. IMPRESSION: Tube and catheter positions as described without pneumothorax. Cardiomegaly with pulmonary vascular congestion. Small pleural effusions. Multifocal airspace opacity may represent pulmonary edema or multifocal pneumonia. Both entities may be present concurrently. Appearance overall similar to recent study. Electronically Signed   By: Lowella Grip III M.D.   On: 02/11/2020 08:51   DG Chest Port 1 View  Result Date: 02/07/2020 CLINICAL DATA:  Acute respiratory failure. Pneumonia due to COVID-19. EXAM: PORTABLE CHEST 1 VIEW COMPARISON:  Radiograph yesterday. FINDINGS: Progressive heterogeneous bilateral airspace opacities, confluent in the lower lobes. Heart size grossly normal but obscured by adjacent airspace disease. Overall low lung volumes. No evidence of pneumomediastinum or pneumothorax. Soft tissue attenuation from habitus limits assessment. IMPRESSION: Bilateral COVID pneumonia which is progressed from yesterday. Low lung volumes persist. Electronically Signed   By: Keith Rake M.D.   On: 02/07/2020 15:36   DG Chest Port 1 View  Result Date: 02/06/2020 CLINICAL DATA:  Sepsis.  Encephalopathy. EXAM: PORTABLE CHEST 1 VIEW COMPARISON:  None. FINDINGS: Multifocal airspace opacity throughout both  lungs. Small pleural effusions. No pneumothorax. IMPRESSION: Multifocal bilateral airspace opacities concerning for multifocal infection. Electronically Signed   By: Ulyses Jarred M.D.   On: 02/06/2020 19:36   DG Abd Portable 1V  Result Date: 02/29/2020 CLINICAL DATA:  Feeding tube placement EXAM: PORTABLE ABDOMEN - 1 VIEW COMPARISON:  1 day prior FINDINGS: Feeding tube terminates  at the distal stomach or entering the duodenal bulb. Contrast within normal caliber colon. Right hemidiaphragm elevation. Bibasilar airspace disease. IMPRESSION: Feeding tube at the distal stomach or duodenal bulb. Electronically Signed   By: Abigail Miyamoto M.D.   On: 02/29/2020 12:03   DG Abd Portable 1V  Result Date: 02/28/2020 CLINICAL DATA:  OG tube placement. EXAM: PORTABLE ABDOMEN - 1 VIEW COMPARISON:  None. FINDINGS: Small bore feeding tube is noted with tip overlying the duodenum versus distal stomach. No dilated bowel loops are noted within the mid-UPPER abdomen. Contrast is noted within the colon, from swallowing function study performed yesterday. IMPRESSION: Small bore feeding tube with tip overlying the duodenum/distal stomach. No dilated bowel loops identified on this exam. Electronically Signed   By: Margarette Canada M.D.   On: 02/28/2020 17:55   DG Swallowing Func-Speech Pathology  Result Date: 02/27/2020 Objective Swallowing Evaluation: Type of Study: MBS-Modified Barium Swallow Study  Patient Details Name: EYVA CALIFANO MRN: [id number] Date of Birth: October 12, 1980 Today's Date: 02/27/2020 Time: SLP Start Time (ACUTE ONLY): 1358 -SLP Stop Time (ACUTE ONLY): 1420 SLP Time Calculation (min) (ACUTE ONLY): 22 min Past Medical History: No past medical history on file. Past Surgical History: No past surgical history on file. HPI: Pt is a 40 year old female with medical history significant for obesity.  She presented to [facility] ED on 12/15 with 24 to 48 hours of AMS following exposure to her COVID+ son. CXR showed diffuse bilateral airspace disease, she was confused and agitated. Physical restraints applied on admission and Precedex infusion given.  She required 100% nonrebreather mask and was transferred to [facility] for further intervention. CXR 12/29: Improved bilateral ventilation with residual confluent bibasilar opacity. CT head negative. ETT 12/17-trach 12/28. Stat CT head completed on 1/3  due to increased weakness noted by PT and was negative.  No data recorded Assessment / Plan / Recommendation CHL IP CLINICAL IMPRESSIONS 02/27/2020 Clinical Impression Pt demonstrates a moderate oropharyngeal dysphagia likely secondry to prolonged intubation with decreased glottic closure. Pt has adequate oral phase (though no upper dentition) and timely swallow, though there is silent aspiration during the swallow with thin and nectar thick liquids regardless of bolus size and despite a chin tuck. Pt was not wearing her PMSV during this exam (pt COVID positive and PMSV left in room) and Cortrak was also in place. Suspect that pt may be able to quickly upgrade to nectar when cortrak comes out and pt has PMSV in place to aid in cough/throat clear response. For now pt able to initaite a dys 2/honey thick diet. WIll f/u for tolerance. SLP Visit Diagnosis Dysphagia, unspecified (R13.10) Attention and concentration deficit following -- Frontal lobe and executive function deficit following -- Impact on safety and function Moderate aspiration risk   CHL IP TREATMENT RECOMMENDATION 02/27/2020 Treatment Recommendations Therapy as outlined in treatment plan below   Prognosis 02/27/2020 Prognosis for Safe Diet Advancement Good Barriers to Reach Goals -- Barriers/Prognosis Comment -- CHL IP DIET RECOMMENDATION 02/27/2020 SLP Diet Recommendations Dysphagia 2 (Fine chop) solids;Honey thick liquids Liquid Administration via Cup;Straw Medication Administration Whole meds with puree Compensations Slow rate;Small sips/bites  Postural Changes Seated upright at 90 degrees   CHL IP OTHER RECOMMENDATIONS 02/27/2020 Recommended Consults -- Oral Care Recommendations -- Other Recommendations Place PMSV during PO intake;Have oral suction available   CHL IP FOLLOW UP RECOMMENDATIONS 02/27/2020 Follow up Recommendations Inpatient Rehab   CHL IP FREQUENCY AND DURATION 02/27/2020 Speech Therapy Frequency (ACUTE ONLY) min 2x/week Treatment Duration 2 weeks       CHL IP ORAL PHASE 02/27/2020 Oral Phase Impaired Oral - Pudding Teaspoon -- Oral - Pudding Cup -- Oral - Honey Teaspoon WFL Oral - Honey Cup WFL Oral - Nectar Teaspoon WFL Oral - Nectar Cup WFL Oral - Nectar Straw WFL Oral - Thin Teaspoon -- Oral - Thin Cup WFL Oral - Thin Straw WFL Oral - Puree WFL Oral - Mech Soft -- Oral - Regular -- Oral - Multi-Consistency -- Oral - Pill -- Oral Phase - Comment --  CHL IP PHARYNGEAL PHASE 02/27/2020 Pharyngeal Phase Impaired Pharyngeal- Pudding Teaspoon -- Pharyngeal -- Pharyngeal- Pudding Cup -- Pharyngeal -- Pharyngeal- Honey Teaspoon -- Pharyngeal -- Pharyngeal- Honey Cup WFL Pharyngeal -- Pharyngeal- Nectar Teaspoon -- Pharyngeal -- Pharyngeal- Nectar Cup Penetration/Aspiration during swallow;Reduced airway/laryngeal closure Pharyngeal Material enters airway, passes BELOW cords without attempt by patient to eject out (silent aspiration) Pharyngeal- Nectar Straw Penetration/Aspiration during swallow;Reduced airway/laryngeal closure Pharyngeal Material enters airway, passes BELOW cords without attempt by patient to eject out (silent aspiration) Pharyngeal- Thin Teaspoon -- Pharyngeal -- Pharyngeal- Thin Cup Penetration/Aspiration during swallow Pharyngeal Material enters airway, passes BELOW cords without attempt by patient to eject out (silent aspiration) Pharyngeal- Thin Straw Penetration/Aspiration during swallow Pharyngeal Material enters airway, passes BELOW cords without attempt by patient to eject out (silent aspiration) Pharyngeal- Puree WFL Pharyngeal -- Pharyngeal- Mechanical Soft WFL Pharyngeal -- Pharyngeal- Regular -- Pharyngeal -- Pharyngeal- Multi-consistency -- Pharyngeal -- Pharyngeal- Pill -- Pharyngeal -- Pharyngeal Comment --  No flowsheet data found. DeBlois, Katherene Ponto 02/27/2020, 2:47 PM              EEG adult  Result Date: 02/17/2020 Lora Havens, MD     02/17/2020  3:05 PM Patient Name: LATORIE MONTESANO MRN: [id number] Epilepsy Attending:  Lora Havens Referring Physician/Provider: Dr Brand Males Date: 02/17/2020 Duration: 24.30 mins Patient history: 40yo F with ams in setting of sepsis and Covid. EEG to evaluate for seizure Level of alertness:  comatose AEDs during EEG study: Versed Technical aspects: This EEG study was done with scalp electrodes positioned according to the 10-20 International system of electrode placement. Electrical activity was acquired at a sampling rate of 500Hz  and reviewed with a high frequency filter of 70Hz  and a low frequency filter of 1Hz . EEG data were recorded continuously and digitally stored. Description: EEG showed continuous generalized 3 to 6 Hz theta-delta slowing admixed with an excessive amount of 15 to 18 Hz beta activity distributed symmetrically and diffusely.  Hyperventilation and photic stimulation were not performed.   ABNORMALITY -Excessive beta, generalized -Continuous slow, generalized IMPRESSION: This study is suggestive of severe diffuse encephalopathy, nonspecific etiology but likely related to sedation. No seizures or epileptiform discharges were seen throughout the recording. Lora Havens   ECHOCARDIOGRAM COMPLETE  Result Date: 02/12/2020    ECHOCARDIOGRAM REPORT   Patient Name:   JAMISEN HAWES Date of Exam: 02/12/2020 Medical Rec #:  [id number]        Height:       63.0 in Accession #:    [id number]       Weight:  240.5 lb Date of Birth:  1980/07/01        BSA:          2.091 m Patient Age:    31 years         BP:           99/70 mmHg Patient Gender: F                HR:           54 bpm. Exam Location:  Inpatient Procedure: 2D Echo, Cardiac Doppler and Color Doppler Indications:    Acute respiratory distress R06.03  History:        Patient has no prior history of Echocardiogram examinations.  Sonographer:    Clayton Lefort RDCS (AE) Referring Phys: [id number]  Sonographer Comments: Image acquisition challenging due to patient body habitus. IMPRESSIONS  1. Left  ventricular ejection fraction, by estimation, is 55 to 60%. The left ventricle has normal function. The left ventricle has no regional wall motion abnormalities. Left ventricular diastolic parameters were normal.  2. Right ventricular systolic function is normal. The right ventricular size is normal. The estimated right ventricular systolic pressure is 51.0 mmHg.  3. The mitral valve is grossly normal. Trivial mitral valve regurgitation.  4. The aortic valve is tricuspid. Aortic valve regurgitation is not visualized.  5. The inferior vena cava is normal in size with greater than 50% respiratory variability, suggesting right atrial pressure of 3 mmHg. Conclusion(s)/Recommendation(s): Normal biventricular function without evidence of hemodynamically significant valvular heart disease. FINDINGS  Left Ventricle: Left ventricular ejection fraction, by estimation, is 55 to 60%. The left ventricle has normal function. The left ventricle has no regional wall motion abnormalities. The left ventricular internal cavity size was normal in size. There is  no left ventricular hypertrophy. Left ventricular diastolic parameters were normal. Right Ventricle: The right ventricular size is normal. No increase in right ventricular wall thickness. Right ventricular systolic function is normal. The tricuspid regurgitant velocity is 2.26 m/s, and with an assumed right atrial pressure of 3 mmHg, the estimated right ventricular systolic pressure is 25.8 mmHg. Left Atrium: Left atrial size was normal in size. Right Atrium: Right atrial size was normal in size. Pericardium: There is no evidence of pericardial effusion. Mitral Valve: The mitral valve is grossly normal. Trivial mitral valve regurgitation. Tricuspid Valve: The tricuspid valve is grossly normal. Tricuspid valve regurgitation is trivial. Aortic Valve: The aortic valve is tricuspid. Aortic valve regurgitation is not visualized. Aortic valve mean gradient measures 3.0 mmHg. Aortic  valve peak gradient measures 5.8 mmHg. Aortic valve area, by VTI measures 2.60 cm. Pulmonic Valve: The pulmonic valve was grossly normal. Pulmonic valve regurgitation is trivial. Aorta: The aortic root and ascending aorta are structurally normal, with no evidence of dilitation. Venous: The inferior vena cava is normal in size with greater than 50% respiratory variability, suggesting right atrial pressure of 3 mmHg. IAS/Shunts: No atrial level shunt detected by color flow Doppler.  LEFT VENTRICLE PLAX 2D LVIDd:         5.20 cm  Diastology LVIDs:         3.80 cm  LV e' medial:    9.90 cm/s LV PW:         1.30 cm  LV E/e' medial:  7.5 LV IVS:        1.00 cm  LV e' lateral:   10.10 cm/s LVOT diam:     2.20 cm  LV E/e' lateral: 7.4 LV  SV:         67 LV SV Index:   32 LVOT Area:     3.80 cm  RIGHT VENTRICLE             IVC RV Basal diam:  2.60 cm     IVC diam: 1.10 cm RV S prime:     11.10 cm/s TAPSE (M-mode): 1.9 cm LEFT ATRIUM             Index       RIGHT ATRIUM           Index LA diam:        2.90 cm 1.39 cm/m  RA Area:     14.60 cm LA Vol (A2C):   49.8 ml 23.81 ml/m RA Volume:   35.90 ml  17.17 ml/m LA Vol (A4C):   37.8 ml 18.07 ml/m LA Biplane Vol: 46.4 ml 22.19 ml/m  AORTIC VALVE AV Area (Vmax):    2.65 cm AV Area (Vmean):   2.80 cm AV Area (VTI):     2.60 cm AV Vmax:           120.00 cm/s AV Vmean:          76.100 cm/s AV VTI:            0.259 m AV Peak Grad:      5.8 mmHg AV Mean Grad:      3.0 mmHg LVOT Vmax:         83.60 cm/s LVOT Vmean:        56.100 cm/s LVOT VTI:          0.177 m LVOT/AV VTI ratio: 0.68  AORTA Ao Root diam: 3.30 cm Ao Asc diam:  2.60 cm MITRAL VALVE               TRICUSPID VALVE MV Area (PHT): 3.21 cm    TR Peak grad:   20.4 mmHg MV Decel Time: 236 msec    TR Vmax:        226.00 cm/s MV E velocity: 74.60 cm/s MV A velocity: 40.70 cm/s  SHUNTS MV E/A ratio:  1.83        Systemic VTI:  0.18 m                            Systemic Diam: 2.20 cm Lyman Bishop MD Electronically signed  by Lyman Bishop MD Signature Date/Time: 02/12/2020/6:17:29 PM    Final    CT HEAD CODE STROKE WO CONTRAST`  Result Date: 02/25/2020 CLINICAL DATA:  Code stroke.  Left-sided weakness EXAM: CT HEAD WITHOUT CONTRAST TECHNIQUE: Contiguous axial images were obtained from the base of the skull through the vertex without intravenous contrast. COMPARISON:  None. FINDINGS: Brain: There is no acute intracranial hemorrhage, mass effect, or edema. Gray-white differentiation is preserved. No extra-axial collection. Ventricles and sulci are normal in size and configuration. Vascular: No hyperdense vessel. Skull: Unremarkable. Sinuses/Orbits: Nonspecific paranasal sinus opacification primarily involving right maxillary and both sphenoid sinuses. There is thinning or dehiscence of the inferior right maxillary sinus wall. Orbits are unremarkable. Other: Minimal mastoid opacification. ASPECTS ([place] Stroke Program Early CT Score) - Ganglionic level infarction (caudate, lentiform nuclei, internal capsule, insula, M1-M3 cortex): 7 - Supraganglionic infarction (M4-M6 cortex): 3 Total score (0-10 with 10 being normal): 10 IMPRESSION: There is no acute intracranial hemorrhage or evidence of acute infarction. ASPECT score is 10. These results were communicated to Dr. Lorrin Goodell at  12:15 pm on 02/25/2020 by text page via the [facility] messaging system. Electronically Signed   By: Macy Mis M.D.   On: 02/25/2020 12:36   VAS [place] LOWER EXTREMITY VENOUS (DVT)  Result Date: 02/18/2020  Lower Venous DVT Study Indications: Covid-19, elevated D-Dimer.  Comparison Study: No prior study Performing Technologist: Sharion Dove RVS  Examination Guidelines: A complete evaluation includes B-mode imaging, spectral Doppler, color Doppler, and power Doppler as needed of all accessible portions of each vessel. Bilateral testing is considered an integral part of a complete examination. Limited examinations for reoccurring indications may be performed  as noted. The reflux portion of the exam is performed with the patient in reverse Trendelenburg.  +---------+---------------+---------+-----------+----------+--------------+  RIGHT     Compressibility Phasicity Spontaneity Properties Thrombus Aging  +---------+---------------+---------+-----------+----------+--------------+  CFV       Full            Yes       Yes                                    +---------+---------------+---------+-----------+----------+--------------+  SFJ       Full                                                             +---------+---------------+---------+-----------+----------+--------------+  FV Prox   Full                                                             +---------+---------------+---------+-----------+----------+--------------+  FV Mid    Full                                                             +---------+---------------+---------+-----------+----------+--------------+  FV Distal Full                                                             +---------+---------------+---------+-----------+----------+--------------+  PFV       Full                                                             +---------+---------------+---------+-----------+----------+--------------+  POP       Full            Yes       Yes                                    +---------+---------------+---------+-----------+----------+--------------+  PTV       Full                                                             +---------+---------------+---------+-----------+----------+--------------+  PERO      Full                                                             +---------+---------------+---------+-----------+----------+--------------+   +---------+---------------+---------+-----------+----------+--------------+  LEFT      Compressibility Phasicity Spontaneity Properties Thrombus Aging  +---------+---------------+---------+-----------+----------+--------------+  CFV       Full             Yes       Yes                                    +---------+---------------+---------+-----------+----------+--------------+  SFJ       Full                                                             +---------+---------------+---------+-----------+----------+--------------+  FV Prox   Full                                                             +---------+---------------+---------+-----------+----------+--------------+  FV Mid    Full                                                             +---------+---------------+---------+-----------+----------+--------------+  FV Distal Full                                                             +---------+---------------+---------+-----------+----------+--------------+  PFV       Full                                                             +---------+---------------+---------+-----------+----------+--------------+  POP       Full            Yes       Yes                                    +---------+---------------+---------+-----------+----------+--------------+  PTV       Full                                                             +---------+---------------+---------+-----------+----------+--------------+  PERO      Full                                                             +---------+---------------+---------+-----------+----------+--------------+     Summary: BILATERAL: - No evidence of deep vein thrombosis seen in the lower extremities, bilaterally. -No evidence of popliteal cyst, bilaterally.   *See table(s) above for measurements and observations. Electronically signed by Ruta Hinds MD on 02/18/2020 at 5:24:02 PM.    Final

## 2020-03-02 NOTE — Plan of Care (Signed)
  Problem: Education: Goal: Knowledge of General Education information will improve Description: Including pain rating scale, medication(s)/side effects and non-pharmacologic comfort measures Outcome: Progressing   Problem: Health Behavior/Discharge Planning: Goal: Ability to manage health-related needs will improve Outcome: Progressing   Problem: Clinical Measurements: Goal: Ability to maintain clinical measurements within normal limits will improve Outcome: Progressing Goal: Will remain free from infection Outcome: Progressing Goal: Diagnostic test results will improve Outcome: Progressing Goal: Respiratory complications will improve Outcome: Progressing Goal: Cardiovascular complication will be avoided Outcome: Progressing   Problem: Activity: Goal: Risk for activity intolerance will decrease Outcome: Progressing   Problem: Nutrition: Goal: Adequate nutrition will be maintained Outcome: Progressing   Problem: Coping: Goal: Level of anxiety will decrease Outcome: Progressing   Problem: Elimination: Goal: Will not experience complications related to bowel motility Outcome: Progressing Goal: Will not experience complications related to urinary retention Outcome: Progressing   Problem: Pain Managment: Goal: General experience of comfort will improve Outcome: Progressing   Problem: Safety: Goal: Ability to remain free from injury will improve Outcome: Progressing   Problem: Skin Integrity: Goal: Risk for impaired skin integrity will decrease Outcome: Progressing   Problem: Education: Goal: Knowledge of risk factors and measures for prevention of condition will improve Outcome: Progressing   Problem: Coping: Goal: Psychosocial and spiritual needs will be supported Outcome: Progressing   Problem: Respiratory: Goal: Will maintain a patent airway Outcome: Progressing Goal: Complications related to the disease process, condition or treatment will be avoided or  minimized Outcome: Progressing   Problem: Safety: Goal: Non-violent Restraint(s) Outcome: Progressing

## 2020-03-03 DIAGNOSIS — G9341 Metabolic encephalopathy: Secondary | ICD-10-CM | POA: Diagnosis not present

## 2020-03-03 DIAGNOSIS — J9601 Acute respiratory failure with hypoxia: Secondary | ICD-10-CM | POA: Diagnosis not present

## 2020-03-03 LAB — COMPREHENSIVE METABOLIC PANEL
ALT: 54 U/L — ABNORMAL HIGH (ref 0–44)
AST: 31 U/L (ref 15–41)
Albumin: 2.6 g/dL — ABNORMAL LOW (ref 3.5–5.0)
Alkaline Phosphatase: 76 U/L (ref 38–126)
Anion gap: 12 (ref 5–15)
BUN: 16 mg/dL (ref 6–20)
CO2: 26 mmol/L (ref 22–32)
Calcium: 8.4 mg/dL — ABNORMAL LOW (ref 8.9–10.3)
Chloride: 97 mmol/L — ABNORMAL LOW (ref 98–111)
Creatinine, Ser: 0.6 mg/dL (ref 0.44–1.00)
GFR, Estimated: >60 mL/min (ref >60)
Glucose, Bld: 126 mg/dL — ABNORMAL HIGH (ref 70–99)
Potassium: 3.6 mmol/L (ref 3.5–5.1)
Sodium: 135 mmol/L (ref 135–145)
Total Bilirubin: 0.6 mg/dL (ref 0.3–1.2)
Total Protein: 6.6 g/dL (ref 6.5–8.1)

## 2020-03-03 LAB — GLUCOSE, CAPILLARY
Glucose-Capillary: 132 mg/dL — ABNORMAL HIGH (ref 70–99)
Glucose-Capillary: 98 mg/dL (ref 70–99)
Glucose-Capillary: 105 mg/dL — ABNORMAL HIGH (ref 70–99)
Glucose-Capillary: 82 mg/dL (ref 70–99)
Glucose-Capillary: 105 mg/dL — ABNORMAL HIGH (ref 70–99)

## 2020-03-03 LAB — CBC
HCT: 29 % — ABNORMAL LOW (ref 36.0–46.0)
Hemoglobin: 9.8 g/dL — ABNORMAL LOW (ref 12.0–15.0)
MCH: 31 pg (ref 26.0–34.0)
MCHC: 33.8 g/dL (ref 30.0–36.0)
MCV: 91.8 fL (ref 80.0–100.0)
Platelets: 254 10*3/uL (ref 150–400)
RBC: 3.16 MIL/uL — ABNORMAL LOW (ref 3.87–5.11)
RDW: 13.5 % (ref 11.5–15.5)
WBC: 6.9 10*3/uL (ref 4.0–10.5)
nRBC: 0.3 % — ABNORMAL HIGH (ref 0.0–0.2)

## 2020-03-03 MED ORDER — RISPERIDONE 1 MG PO TABS
1.0000 mg | ORAL_TABLET | Freq: Every day | ORAL | Status: DC
  Administered 2020-03-03: 1 mg via ORAL
  Filled 2020-03-03: qty 1

## 2020-03-03 MED ORDER — ALPRAZOLAM 0.5 MG PO TABS
1.0000 mg | ORAL_TABLET | Freq: Three times a day (TID) | ORAL | Status: DC | PRN
  Administered 2020-03-03 – 2020-03-05 (×7): 1 mg via ORAL
  Filled 2020-03-03 (×7): qty 2

## 2020-03-03 MED ORDER — CLOBETASOL PROPIONATE 0.05 % EX CREA
1.0000 "application " | TOPICAL_CREAM | Freq: Two times a day (BID) | CUTANEOUS | Status: DC
  Administered 2020-03-03: 1 via TOPICAL
  Filled 2020-03-03: qty 15

## 2020-03-03 MED ORDER — HYDROMORPHONE HCL 2 MG PO TABS
1.0000 mg | ORAL_TABLET | Freq: Three times a day (TID) | ORAL | Status: DC | PRN
  Administered 2020-03-03 – 2020-03-04 (×3): 1 mg via ORAL
  Filled 2020-03-03 (×3): qty 1

## 2020-03-03 MED ORDER — METHADONE HCL 10 MG PO TABS
5.0000 mg | ORAL_TABLET | Freq: Two times a day (BID) | ORAL | Status: DC
  Administered 2020-03-03 (×2): 5 mg via ORAL
  Filled 2020-03-03 (×2): qty 1

## 2020-03-03 NOTE — Progress Notes (Signed)
Inpatient Rehabilitation Admissions Coordinator   I met at bedside with patient and her Mom.Patient is progressing well with therapy. I await Insurance authorization for a possible [facility] admit this week  Danne Baxter, RN, MSN Rehab Admissions Coordinator [phone] 03/03/2020 1:06 PM

## 2020-03-03 NOTE — Progress Notes (Signed)
Physical Therapy Treatment Patient Details Name: Belinda [name] MRN: [id number] DOB: 12-Jun-1980 Today's Date: 03/03/2020    History of Present Illness 40 yo admitted 12/15 with AMS, fever, ARDS due to Covid 19 (+) and UTI. Intubated 12/17, trach 12/28. Pt with initial agitation/ delirium during admission. 1/3 pt with impaired balance and function with CT negative for stroke. PMhx: obesity    PT Comments    Pt very eager to get up and walk with therapy and requests to walk in hallway. Pt already up in recliner so wheeled to hallway for straight line ambulation. Pt requires minAx2 for initial sit>stand from recliner and min A x1 for physical assist and additional assist for close chair follow and lines and leads. Pt walks 8 feet and then requires seated rest break.  On second attempt able to stand with min guard assist and ambulate 10 feet. Pt ambulates without Passy Muir Valve to maximize oxygenation and is able to maintain oxygenation on 4L O2 FiO2 28%O2. Pt continues to be very motivated to return to PLOF, and needs PT, OT and SLP to achieve making her a perfect candidate for CIR level rehabilitation. PT will continue to follow acutely.   Follow Up Recommendations  CIR;Supervision/Assistance - 24 hour     Equipment Recommendations  Rolling walker with 5" wheels;3in1 (PT)       Precautions / Restrictions Precautions Precautions: Fall Precaution Comments: trach, cortrak, watch HR Restrictions Weight Bearing Restrictions: No    Mobility  Bed Mobility               General bed mobility comments: Pt OOB in recliner at beginning and end of session  Transfers Overall transfer level: Needs assistance Equipment used: Rolling walker (2 wheeled) Transfers: Sit to/from Stand Sit to Stand: Min assist         General transfer comment: min +2 assist to stand from recliner initially, minguard for additional standing with repeated cues for hand  placement  Ambulation/Gait Ambulation/Gait assistance: Min assist;+2 safety/equipment (close chair follow) Gait Distance (Feet): 10 Feet (1x8, 1x10) Assistive device: Rolling walker (2 wheeled) Gait Pattern/deviations: Step-to pattern;Trunk flexed;Narrow base of support Gait velocity: slowed Gait velocity interpretation: <1.31 ft/sec, indicative of household ambulator General Gait Details: min A for steadying, vc for upright posture and proximity to RW, pt with increased LE fatigue with distance       Balance Overall balance assessment: Needs assistance Sitting-balance support: Feet supported;Bilateral upper extremity supported Sitting balance-Leahy Scale: Fair   Postural control: Right lateral lean Standing balance support: Bilateral upper extremity supported Standing balance-Leahy Scale: Poor Standing balance comment: reliant on external support, right lean with gait                            Cognition Arousal/Alertness: Awake/alert Behavior During Therapy: WFL for tasks assessed/performed;Anxious Overall Cognitive Status: Impaired/Different from baseline Area of Impairment: Safety/judgement;Awareness                         Safety/Judgement: Decreased awareness of safety;Decreased awareness of deficits Awareness: Emergent   General Comments: cognition continues to improve. good awareness of anxiety, relaxation methods coached         General Comments General comments (skin integrity, edema, etc.): 4L FiO2 at 28%O2, HR max 120s with ambulation      Pertinent Vitals/Pain Pain Assessment: No/denies pain Pain Score: 0-No pain Pain Intervention(s): Monitored during session;Repositioned  PT Goals (current goals can now be found in the care plan section) Acute Rehab PT Goals Patient Stated Goal: to get stronger and go to rehab PT Goal Formulation: Patient unable to participate in goal setting Time For Goal Achievement:  03/06/20 Potential to Achieve Goals: Fair Progress towards PT goals: Progressing toward goals    Frequency    Min 3X/week      PT Plan Current plan remains appropriate    Co-evaluation PT/OT/SLP Co-Evaluation/Treatment: Yes Reason for Co-Treatment: Complexity of the patient's impairments (multi-system involvement);For patient/therapist safety PT goals addressed during session: Mobility/safety with mobility;Proper use of DME OT goals addressed during session: ADL's and self-care;Strengthening/ROM      AM-PAC PT "6 Clicks" Mobility   Outcome Measure  Help needed turning from your back to your side while in a flat bed without using bedrails?: A Little Help needed moving from lying on your back to sitting on the side of a flat bed without using bedrails?: A Little Help needed moving to and from a bed to a chair (including a wheelchair)?: A Little Help needed standing up from a chair using your arms (e.g., wheelchair or bedside chair)?: A Little Help needed to walk in hospital room?: A Little Help needed climbing 3-5 steps with a railing? : Total 6 Click Score: 16    End of Session Equipment Utilized During Treatment: Gait belt Activity Tolerance: Patient tolerated treatment well Patient left: in chair;with call bell/phone within reach;with chair alarm set Nurse Communication: Mobility status PT Visit Diagnosis: Other abnormalities of gait and mobility (R26.89);Muscle weakness (generalized) (M62.81);Other symptoms and signs involving the nervous system (D22.025)     Time: 4270-6237 PT Time Calculation (min) (ACUTE ONLY): 28 min  Charges:  $Gait Training: 8-22 mins                     Halie Gass B. Migdalia Dk PT, DPT Acute Rehabilitation Services Pager [phone] Office [phone]    [place] 03/03/2020, 1:57 PM

## 2020-03-03 NOTE — Progress Notes (Signed)
Occupational Therapy Treatment Patient Details Name: Belinda [name] MRN: [id number] DOB: 1980-08-23 Today's Date: 03/03/2020    History of present illness 40 yo admitted 12/15 with AMS, fever, ARDS due to Covid 19 (+) and UTI. Intubated 12/17, trach 12/28. Pt with initial agitation/ delirium during admission. 1/3 pt with impaired balance and function with CT negative for stroke. PMhx: obesity   OT comments  Pt progressing very well with therapy today. Pt extremely motivated and was able to walk in hallway (started in hallway). Pt educated and utilized IT trainer for anxiety. Pt min A +2 for initial stand, and progressed to min guard assist, vc for safe hand placement. Activity tolerance decreased especially with reference to ADL - CIR continues to be essential to maximize safety and independence in ADL and functional transfers.    Follow Up Recommendations  CIR;Supervision/Assistance - 24 hour    Equipment Recommendations  3 in 1 bedside commode;Other (comment)    Recommendations for Other Services Rehab consult    Precautions / Restrictions Precautions Precautions: Fall Precaution Comments: trach, cortrak, watch HR Restrictions Weight Bearing Restrictions: No       Mobility Bed Mobility               General bed mobility comments: Pt OOB in recliner at beginning and end of session  Transfers Overall transfer level: Needs assistance Equipment used: Rolling walker (2 wheeled) Transfers: Sit to/from Stand Sit to Stand: Min assist         General transfer comment: min +2 assist to stand from recliner initially, minguard for additional standing with repeated cues for hand placement    Balance Overall balance assessment: Needs assistance Sitting-balance support: Feet supported;Bilateral upper extremity supported Sitting balance-Leahy Scale: Fair   Postural control: Right lateral lean Standing balance support: Bilateral upper extremity  supported Standing balance-Leahy Scale: Poor Standing balance comment: reliant on external support, right lean with gait                           ADL either performed or assessed with clinical judgement   ADL Overall ADL's : Needs assistance/impaired     Grooming: Set up;Sitting Grooming Details (indicate cue type and reason): in reclienr                 Toilet Transfer: Minimal assistance;Stand-pivot Toilet Transfer Details (indicate cue type and reason): has been using BSC with RN in addition to therapy! Toileting- Clothing Manipulation and Hygiene: Moderate assistance;Sit to/from stand       Functional mobility during ADLs: Minimal assistance;Rolling walker;Cueing for safety       Vision       Perception     Praxis      Cognition Arousal/Alertness: Awake/alert Behavior During Therapy: WFL for tasks assessed/performed;Anxious Overall Cognitive Status: Impaired/Different from baseline Area of Impairment: Safety/judgement;Awareness                         Safety/Judgement: Decreased awareness of safety;Decreased awareness of deficits Awareness: Emergent   General Comments: cognition continues to improve. good awareness of anxiety, relaxation methods coached        Exercises     Shoulder Instructions       General Comments 4L FiO2 at 28%    Pertinent Vitals/ Pain       Pain Assessment: No/denies pain Faces Pain Scale: Hurts a little bit Pain Location: stomach - pt describes as "gas pain" Pain  Descriptors / Indicators: Other (Comment) ("gas pain") Pain Intervention(s): Monitored during session;Repositioned;Utilized relaxation techniques  Home Living                                          Prior Functioning/Environment              Frequency  Min 3X/week        Progress Toward Goals  OT Goals(current goals can now be found in the care plan section)  Progress towards OT goals: Progressing toward  goals  Acute Rehab OT Goals Patient Stated Goal: to get stronger and go to rehab OT Goal Formulation: With patient Time For Goal Achievement: 03/10/20 Potential to Achieve Goals: Good  Plan Discharge plan remains appropriate;Frequency needs to be updated    Co-evaluation    PT/OT/SLP Co-Evaluation/Treatment: Yes Reason for Co-Treatment: Complexity of the patient's impairments (multi-system involvement);For patient/therapist safety;To address functional/ADL transfers PT goals addressed during session: Mobility/safety with mobility;Proper use of DME OT goals addressed during session: ADL's and self-care;Strengthening/ROM      AM-PAC OT "6 Clicks" Daily Activity     Outcome Measure   Help from another person eating meals?: A Little Help from another person taking care of personal grooming?: A Little Help from another person toileting, which includes using toliet, bedpan, or urinal?: A Lot Help from another person bathing (including washing, rinsing, drying)?: A Lot Help from another person to put on and taking off regular upper body clothing?: A Little Help from another person to put on and taking off regular lower body clothing?: A Lot 6 Click Score: 15    End of Session Equipment Utilized During Treatment: Gait belt;Rolling walker;Oxygen (28% FiO2 with 4L)  OT Visit Diagnosis: Unsteadiness on feet (R26.81);Other abnormalities of gait and mobility (R26.89);Muscle weakness (generalized) (M62.81);Low vision, both eyes (H54.2);Other symptoms and signs involving the nervous system (R29.898);Other symptoms and signs involving cognitive function;Pain   Activity Tolerance Patient tolerated treatment well   Patient Left in chair;with call bell/phone within reach;with chair alarm set   Nurse Communication Mobility status        Time: 8110-3159 OT Time Calculation (min): 32 min  Charges: OT General Charges $OT Visit: 1 Visit OT Treatments $Therapeutic Activity: 8-22 mins [place] Pager: [phone] Office: [place] 03/03/2020, 1:45 PM

## 2020-03-03 NOTE — Plan of Care (Signed)
°  Problem: Education: Goal: Knowledge of General Education information will improve Description: Including pain rating scale, medication(s)/side effects and non-pharmacologic comfort measures 03/03/2020 1421 by Shanon Ace, RN Outcome: Progressing 03/03/2020 1421 by Shanon Ace, RN Outcome: Progressing   Problem: Health Behavior/Discharge Planning: Goal: Ability to manage health-related needs will improve 03/03/2020 1421 by Shanon Ace, RN Outcome: Progressing 03/03/2020 1421 by Shanon Ace, RN Outcome: Progressing   Problem: Clinical Measurements: Goal: Ability to maintain clinical measurements within normal limits will improve 03/03/2020 1421 by Shanon Ace, RN Outcome: Progressing 03/03/2020 1421 by Shanon Ace, RN Outcome: Progressing Goal: Will remain free from infection 03/03/2020 1421 by Shanon Ace, RN Outcome: Progressing 03/03/2020 1421 by Shanon Ace, RN Outcome: Progressing Goal: Diagnostic test results will improve 03/03/2020 1421 by Shanon Ace, RN Outcome: Progressing 03/03/2020 1421 by Shanon Ace, RN Outcome: Progressing Goal: Respiratory complications will improve 03/03/2020 1421 by Shanon Ace, RN Outcome: Progressing 03/03/2020 1421 by Shanon Ace, RN Outcome: Progressing Goal: Cardiovascular complication will be avoided 03/03/2020 1421 by Shanon Ace, RN Outcome: Progressing 03/03/2020 1421 by Shanon Ace, RN Outcome: Progressing   Problem: Activity: Goal: Risk for activity intolerance will decrease 03/03/2020 1421 by Shanon Ace, RN Outcome: Progressing 03/03/2020 1421 by Shanon Ace, RN Outcome: Progressing   Problem: Nutrition: Goal: Adequate nutrition will be maintained 03/03/2020 1421 by Shanon Ace, RN Outcome: Progressing 03/03/2020 1421 by Shanon Ace, RN Outcome: Progressing   Problem: Coping: Goal: Level of anxiety will decrease 03/03/2020 1421 by Shanon Ace, RN Outcome: Progressing 03/03/2020 1421 by Shanon Ace, RN Outcome: Progressing   Problem: Elimination: Goal: Will not experience complications related to bowel motility 03/03/2020 1421 by Shanon Ace, RN Outcome: Progressing 03/03/2020 1421 by Shanon Ace, RN Outcome: Progressing Goal: Will not experience complications related to urinary retention 03/03/2020 1421 by Shanon Ace, RN Outcome: Progressing 03/03/2020 1421 by Shanon Ace, RN Outcome: Progressing   Problem: Pain Managment: Goal: General experience of comfort will improve 03/03/2020 1421 by Shanon Ace, RN Outcome: Progressing 03/03/2020 1421 by Shanon Ace, RN Outcome: Progressing   Problem: Safety: Goal: Ability to remain free from injury will improve 03/03/2020 1421 by Shanon Ace, RN Outcome: Progressing 03/03/2020 1421 by Shanon Ace, RN Outcome: Progressing   Problem: Skin Integrity: Goal: Risk for impaired skin integrity will decrease 03/03/2020 1421 by Shanon Ace, RN Outcome: Progressing 03/03/2020 1421 by Shanon Ace, RN Outcome: Progressing   Problem: Education: Goal: Knowledge of risk factors and measures for prevention of condition will improve 03/03/2020 1421 by Shanon Ace, RN Outcome: Progressing 03/03/2020 1421 by Shanon Ace, RN Outcome: Progressing   Problem: Coping: Goal: Psychosocial and spiritual needs will be supported 03/03/2020 1421 by Shanon Ace, RN Outcome: Progressing 03/03/2020 1421 by Shanon Ace, RN Outcome: Progressing   Problem: Respiratory: Goal: Will maintain a patent airway 03/03/2020 1421 by Shanon Ace, RN Outcome: Progressing 03/03/2020 1421 by Shanon Ace, RN Outcome: Progressing Goal: Complications related to the disease process, condition or treatment will be avoided or minimized 03/03/2020 1421 by Shanon Ace, RN Outcome: Progressing 03/03/2020 1421 by Shanon Ace, RN Outcome: Progressing   Problem: Safety: Goal: Non-violent Restraint(s) 03/03/2020 1421 by Shanon Ace,  RN Outcome: Progressing 03/03/2020 1421 by Shanon Ace, RN Outcome: Progressing

## 2020-03-03 NOTE — Progress Notes (Signed)
PROGRESS NOTE                                                                             PROGRESS NOTE                                                                                                                                                                                                             Patient Demographics:    Belinda [name], is a 40 y.o. female, DOB - 06/21/1980, ELF:[id number]  Outpatient Primary MD for the patient is Kathyrn Drown, MD    LOS - 86  Admit date - 02/06/2020    Chief Complaint  Patient presents with   Altered Mental Status       Brief Narrative    39/F with history of anxiety and GERD presented to [facility], ER on 12/15 with severe hypoxic respiratory failure, ARDS and metabolic encephalopathy secondary to Covid she was intubated and admitted to the ICU  -Subsequently has completed Covid treatment, had prolonged respiratory failure and eventually had a tracheostomy  -During hospitalization there was concern for opiate versus benzo withdrawal, she was started on methadone and scheduled Dilaudid by CCM team  -Now stable weaned off ventilator, on tube feeds  Via cortrak    Subjective:   -Feels okay, tells me that she hurts all all over, also complains of severe anxiety   Assessment  & Plan :   Acute hypoxic respiratory failure/ARDS/status post tracheostomy  -Prolonged respiratory failure secondary to ARDS from Covid -Underwent tracheostomy placement on 12/28 -Subsequently has been weaned off the ventilator -Also treated for aspiration pneumonia -Pulmonary to follow for trach management  Pneumonia due to COVID-19  -Completed treatment with IV remdesivir, steroids and baricitinib -Came off Covid isolation on 1/6  Klebsiella urinary tract infection with sepsis syndrome suspected aspiration pneumonia  Vs VAP -Completed Unasyn course on 12/20  -Resp Cx MSSA on respiratory culture, completed cefazolin  02/23/18/2022 -Stable off antibiotics at this time  Acute metabolic encephalopathy -Improved, this is secondary  to Covid pneumonia, prolonged respiratory failure, meds, possibly also component of benzo withdrawal  -She started on Risperdal in the ICU, will cut this down  -Resume Xanax per home regimen   Anxiety Polypharmacy -At home on Xanax 4 times daily as needed, per chart review Father reported using Suboxone at home -Patient denies chronic Suboxone use, she admits having used it once or twice -She was started on scheduled methadone per CCM along with scheduled hydromorphone -Will taper down methadone and p.o. Dilaudid -DC Klonopin, restart Xanax  Volume overload ECHO normal 02/12/20.  -Diuresed with IV Lasix early this admission, now euvolemic continue oral Lasix  Hypophosphatemia Replete as needed  Anemia of critical illness Hemoglobin stable.  No evidence of overt bleeding. Anemia panel reviewed. Folate was 18.9, B12 590, ferritin 630.  Nutrition/dysphagia -SLP following, continue dysphagia 2 diet, nectar thick liquids -Also on tube feeds, bolus attempt to wean this off soon  Prediabetes with Hyperglycemia HbA1c 5.8.   -CBGs high due to tube feeds, currently stable on Levemir and NovoLog, will cut this down as tube feeds are weaned off  Hypernatrmia -Resolved  Concern for pituitary lesion This was incidentally noted on MRI brain.  Will need a dedicated pituitary protocol MRI when patient is more stable.  Thyroid function tests were normal: TSH 3.45 and free T4 1.03.   Luteinizing hormone level noted to be 0.6.  Difficult to clinically interpret this level at this time.   Prolactin level significantly elevated at 115.  Normal range is 4.8-23.3.  Patient however noted to be on Risperdal which can increase the level of prolactin.   IGF-I/Somatomedin-C level was 121 which is in the normal range, plasma ACTH also within normal range -Needs endocrinology  follow-up   DVT prophylaxis: Lovenox CODE STATUS: Full code Family communication: No family at bedside, will update father Disposition: Will likely need to go to rehabilitation when medically stable.  Status is: Inpatient  Remains inpatient appropriate because:IV treatments appropriate due to intensity of illness or inability to take PO and Inpatient level of care appropriate due to severity of illness   Dispo: The patient is from: Home              Anticipated d/c is to: CIR              Anticipated d/c date is: > 3 days              Patient currently is not medically stable to d/c.      Consults  :  PCCM, Neurology  Procedures  :  Trach     Inpatient Medications  Scheduled Meds:  chlorhexidine gluconate (MEDLINE KIT)  15 mL Mouth Rinse BID   Chlorhexidine Gluconate Cloth  6 each Topical Daily   enoxaparin (LOVENOX) injection  0.5 mg/kg Subcutaneous Q24H   feeding supplement  237 mL Oral BID BM   feeding supplement (PROSource TF)  45 mL Per Tube TID   feeding supplement (VITAL 1.5 CAL)  1,000 mL Per Tube Q24H   furosemide  20 mg Oral Daily   insulin aspart  0-20 Units Subcutaneous Q4H   insulin detemir  5 Units Subcutaneous BID   mouth rinse  15 mL Mouth Rinse 10 times per day   melatonin  3 mg Oral QHS   methadone  5 mg Oral Q12H   multivitamin with minerals  1 tablet Oral Daily   pantoprazole  40 mg Oral BID AC   polyethylene glycol  17 g Oral Daily  risperiDONE  1 mg Oral QHS   thiamine  100 mg Oral Daily   Continuous Infusions:  sodium chloride Stopped (02/25/20 0849)   lactated ringers 10 mL/hr at 02/08/20 1118   PRN Meds:.sodium chloride, acetaminophen, ALPRAZolam, docusate, guaiFENesin, HYDROmorphone, ipratropium-albuterol, ondansetron (ZOFRAN) IV, Resource ThickenUp Clear, simethicone  Antibiotics  :    Anti-infectives (From admission, onward)   Start     Dose/Rate Route Frequency Ordered Stop   02/18/20 1200  ceFAZolin (ANCEF)  IVPB 2g/100 mL premix        2 g 200 mL/hr over 30 Minutes Intravenous Every 8 hours 02/18/20 1104 02/23/20 2044   02/17/20 2000  vancomycin (VANCOCIN) IVPB 1000 mg/200 mL premix  Status:  Discontinued        1,000 mg 200 mL/hr over 60 Minutes Intravenous Every 8 hours 02/17/20 1146 02/18/20 1104   02/17/20 1345  vancomycin (VANCOREADY) IVPB 2000 mg/400 mL        2,000 mg 200 mL/hr over 120 Minutes Intravenous  Once 02/17/20 1257 02/17/20 1752   02/17/20 1245  vancomycin (VANCOCIN) 2,000 mg in sodium chloride 0.9 % 500 mL IVPB  Status:  Discontinued        2,000 mg 250 mL/hr over 120 Minutes Intravenous  Once 02/17/20 1146 02/17/20 1445   02/07/20 1600  Ampicillin-Sulbactam (UNASYN) 3 g in sodium chloride 0.9 % 100 mL IVPB        3 g 200 mL/hr over 30 Minutes Intravenous Every 6 hours 02/07/20 1513 02/11/20 2145   02/07/20 1000  remdesivir 100 mg in sodium chloride 0.9 % 100 mL IVPB        100 mg 200 mL/hr over 30 Minutes Intravenous Daily 02/06/20 2132 02/10/20 1103   02/07/20 0800  vancomycin (VANCOCIN) IVPB 750 mg/150 ml premix  Status:  Discontinued        750 mg 150 mL/hr over 60 Minutes Intravenous Every 12 hours 02/06/20 2023 02/07/20 1510   02/07/20 0400  ceFEPIme (MAXIPIME) 2 g in sodium chloride 0.9 % 100 mL IVPB  Status:  Discontinued        2 g 200 mL/hr over 30 Minutes Intravenous Every 8 hours 02/06/20 2023 02/07/20 1510   02/06/20 2145  remdesivir 100 mg in sodium chloride 0.9 % 100 mL IVPB        100 mg 200 mL/hr over 30 Minutes Intravenous Every 30 min 02/06/20 2132 02/06/20 2305   02/06/20 1915  vancomycin (VANCOREADY) IVPB 2000 mg/400 mL        2,000 mg 200 mL/hr over 120 Minutes Intravenous  Once 02/06/20 1907 02/06/20 2250   02/06/20 1845  ceFEPIme (MAXIPIME) 2 g in sodium chloride 0.9 % 100 mL IVPB        2 g 200 mL/hr over 30 Minutes Intravenous  Once 02/06/20 1834 02/06/20 2056   02/06/20 1845  metroNIDAZOLE (FLAGYL) IVPB 500 mg        500 mg 100 mL/hr over  60 Minutes Intravenous  Once 02/06/20 1834 02/06/20 2204   02/06/20 1845  vancomycin (VANCOCIN) IVPB 1000 mg/200 mL premix  Status:  Discontinued        1,000 mg 200 mL/hr over 60 Minutes Intravenous  Once 02/06/20 1834 02/06/20 1907       Domenic Polite M.D on 03/03/2020 at 10:23 AM  To page go to [email or web address]   [facility] -  Office  [phone]     Objective:   Vitals:   03/03/20 2633 03/03/20 3545 03/03/20 6256 03/03/20 3893  BP:  95/62  94/68  Pulse: 91 100 (!) 109 100  Resp: _0 Temp:  98.2 F (36.8 C)  98.4 F (36.9 C)  TempSrc:  Oral    SpO2: 91% 92% 93% 92%  Weight:  98.3 kg    Height:        Wt Readings from Last 3 Encounters:  03/03/20 98.3 kg     Intake/Output Summary (Last 24 hours) at 03/03/2020 1023 Last data filed at 03/02/2020 2300 Gross per 24 hour  Intake --  Output 1800 ml  Net -1800 ml     Physical Exam  General: Anxious obese young female sitting up in the recliner, awake alert oriented to self and place HEENT: Tracheostomy with trach collar CVS: S1-S2, regular rate rhythm Lungs: Few scattered rhonchi and conducted upper airway sounds Abdomen: Obese, soft, nontender, bowel sounds present Extremities: No edema  Neuro: Moves all extremities, no localizing signs   Data Review:    CBC Recent Labs  Lab 02/28/20 0614 02/29/20 0029 03/01/20 0044 03/02/20 0041 03/03/20 0026  WBC 7.7 8.2 9.3 8.3 6.9  HGB 10.6* 10.5* 10.2* 9.6* 9.8*  HCT 32.6* 33.1* 30.5* 29.5* 29.0*  PLT 248 223 215 196 254  MCV 95.9 94.6 92.4 91.6 91.8  MCH 31.2 30.0 30.9 29.8 31.0  MCHC 32.5 31.7 33.4 32.5 33.8  RDW 13.7 13.7 13.5 13.5 13.5    Recent Labs  Lab 02/26/20 0400 02/27/20 0350 02/28/20 0614 02/29/20 0029 03/01/20 0044 03/02/20 0041 03/03/20 0026  NA 146*   < > 143 136 134* 134* 135  K 4.1   < > 4.0 3.5 3.7 3.8 3.6  CL 104   < > 98 95* 96* 98 97*  CO2 29   < > _1 GLUCOSE 189*   < > 96 105* 128* 147* 126*  BUN  49*   < > 39* 28* 24* 17 16  CREATININE 0.81   < > 0.79 0.75 0.67 0.70 0.60  CALCIUM 9.5   < > 9.2 8.9 8.5* 8.3* 8.4*  AST  --    < > 62* 63* 45* 35 31  ALT  --    < > 116* 109* 88* 67* 54*  ALKPHOS  --    < > 100 94 88 79 76  BILITOT  --    < > 0.8 1.0 0.8 0.6 0.6  ALBUMIN  --    < > 3.1* 3.0* 2.7* 2.8* 2.6*  MG 2.7*  --   --   --  2.2  --   --   TSH  --   --  3.450  --   --   --   --    < > = values in this interval not displayed.      Lab Results  Component Value Date   HGBA1C 5.8 (H) 02/08/2020    Micro Results No results found for this or any previous visit (from the past 240 hour(s)).  Radiology Reports DG Chest 1 View  Result Date: 02/08/2020 CLINICAL DATA:  OG tube placement. EXAM: CHEST  1 VIEW COMPARISON:  Chest x-ray 02/07/2020. FINDINGS: 539 hours. Bilateral airspace disease noted, left greater than right. Endotracheal tube tip is approximately 4.3 cm above the base of the carina. Left IJ central line tip overlies the innominate vein confluence. NG tube tip is positioned in the stomach with the proximal side port below the GE junction. Telemetry leads overlie the chest. IMPRESSION: NG  tube tip is in the stomach with proximal side port of the tube below the GE junction. Electronically Signed   By: Misty Stanley M.D.   On: 02/08/2020 05:54   CT HEAD WO CONTRAST  Result Date: 02/18/2020 CLINICAL DATA:  Delirium. Altered mental status. EXAM: CT HEAD WITHOUT CONTRAST TECHNIQUE: Contiguous axial images were obtained from the base of the skull through the vertex without intravenous contrast. COMPARISON:  CT head without contrast 09/01/2017 at [facility] FINDINGS: Brain: No acute infarct, hemorrhage, or mass lesion is present. No significant white matter lesions are present. The ventricles are of normal size. No significant extraaxial fluid collection is present. The brainstem and cerebellum are within normal limits. Vascular: No hyperdense vessel or unexpected  calcification. Skull: Calvarium is intact. No focal lytic or blastic lesions are present. No significant extracranial soft tissue lesion is present. Sinuses/Orbits: Patient is intubated. Fluid level is present in the right maxillary sinus. Fluid is present in the nasopharynx. Fluid is present in the sphenoid sinuses and posterior ethmoid air cells bilaterally. Left nasogastric tube is noted. Minimal mastoid fluid is present inferiorly on both sides. IMPRESSION: 1. Normal CT appearance of the brain. 2. Fluid in the paranasal sinuses and nasopharynx likely related to intubation. 3. Minimal mastoid fluid inferiorly on both sides. Electronically Signed   By: San Morelle M.D.   On: 02/18/2020 02:16   CT Head Wo Contrast  Result Date: 02/06/2020 CLINICAL DATA:  Altered mental status.  Recent tooth extraction. EXAM: CT HEAD WITHOUT CONTRAST TECHNIQUE: Contiguous axial images were obtained from the base of the skull through the vertex without intravenous contrast. COMPARISON:  Head CT 09/01/2017 FINDINGS: Brain: No intracranial hemorrhage, mass effect, or midline shift. No hydrocephalus. The basilar cisterns are patent. No evidence of territorial infarct or acute ischemia. No extra-axial or intracranial fluid collection. Vascular: No hyperdense vessel or unexpected calcification. Skull: No fracture or focal lesion. Sinuses/Orbits: Mucosal thickening of the right greater than left maxillary sinus. Scattered mucosal thickening throughout ethmoid air cells. Patient appears edentulous of upper teeth, presumed prior periapical lucency in the right upper molars, contiguous with the right maxillary sinus. No acute orbital abnormality. Mastoid air cells are clear. Other: None. IMPRESSION: 1. No acute intracranial abnormality. 2. Paranasal sinus disease. 3. Patient appears edentulous of upper teeth, presumed prior periapical lucency in the right upper molars, contiguous with the right maxillary sinus. CLINICAL DATA:   Altered mental status. Recent tooth extraction. EXAM: CT HEAD WITHOUT CONTRAST TECHNIQUE: Contiguous axial images were obtained from the base of the skull through the vertex without intravenous contrast. COMPARISON:  Head CT 09/01/2017 FINDINGS: Brain: No intracranial hemorrhage, mass effect, or midline shift. No hydrocephalus. The basilar cisterns are patent. No evidence of territorial infarct or acute ischemia. No extra-axial or intracranial fluid collection. Vascular: No hyperdense vessel or unexpected calcification. Skull: No fracture or focal lesion. Sinuses/Orbits: Mucosal thickening of the right greater than left maxillary sinus. Scattered mucosal thickening throughout ethmoid air cells. Patient appears edentulous of upper teeth, presumed prior periapical lucency in the right upper molars, contiguous with the right maxillary sinus. No acute orbital abnormality. Mastoid air cells are clear. Other: None. IMPRESSION: 1. No acute intracranial abnormality. 2. Paranasal sinus mucosal thickening. 3. Patient appears edentulous of upper teeth, presumed prior periodontal disease in the right upper molars, contiguous with the right maxillary sinus. Electronically Signed   By: Keith Rake M.D.   On: 02/06/2020 22:18   MR BRAIN WO CONTRAST  Result Date: 02/25/2020  CLINICAL DATA:  Neuro deficit, acute, stroke suspected. Additional history provided: COVID positive. EXAM: MRI HEAD WITHOUT CONTRAST TECHNIQUE: Multiplanar, multiecho pulse sequences of the brain and surrounding structures were obtained without intravenous contrast. COMPARISON:  Noncontrast head CT 02/25/2020. FINDINGS: Brain: Intermittently motion degraded exam. Most notably, there is moderate motion degradation of the axial T2/FLAIR sequence. Small focus of chronic cortical encephalomalacia within the anterolateral right frontal lobe (for instance as seen on series 7, images 14-16). 2 x 10 mm (AP x TV) T1 hyperintense lesion within the mid to posterior  pituitary gland (for instance as seen on series 10, image 36). No focal parenchymal signal abnormality is identified elsewhere. There is no acute infarct. No extra-axial fluid collection. No midline shift. Vascular: Expected proximal arterial flow voids. Skull and upper cervical spine: No focal marrow lesion. Sinuses/Orbits: Visualized orbits show no acute finding. Paranasal sinus disease. Most notably, there is extensive partial opacification of the bilateral sphenoid sinuses and moderate right maxillary sinus mucosal thickening. Other: Small bilateral mastoid effusions. IMPRESSION: 1. Intermittently motion degraded exam. 2. No evidence of acute infarct. 3. Small focus of chronic cortical encephalomalacia within the anterolateral right frontal lobe. 4. 2 x 10 mm T1 hyperintense lesion within the mid-to-posterior pituitary gland. 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 However, a cystic adenoma or adenoma with prior hemorrhage cannot be excluded. Correlate with relevant laboratory values. A pituitary protocol brain MRI may be obtained for further characterization, if clinically warranted. 5. Otherwise unremarkable non-contrast MRI appearance of the brain. 6. Paranasal sinus disease, most notably severe bilateral sphenoid sinusitis. 7. Small bilateral mastoid effusions. Electronically Signed   By: Kellie Simmering DO   On: 02/25/2020 17:45   DG Chest Port 1 View  Result Date: 02/20/2020 CLINICAL DATA:  40 year old female COVID-19. EXAM: PORTABLE CHEST 1 VIEW COMPARISON:  Portable chest 02/19/2020 and earlier. FINDINGS: Portable AP semi upright view at 0448 hours. Stable tracheostomy tube, left IJ approach central line and visible enteric tube. Larger lung volumes and improved ventilation. Largely regressed left upper lung opacity since yesterday. Residual confluent bibasilar opacity, including suspected left lower lobe collapse or consolidation. No pneumothorax. No definite pleural effusion.  No acute osseous abnormality identified. Paucity of bowel gas in the upper abdomen. IMPRESSION: 1. Improved bilateral ventilation with residual confluent bibasilar opacity. 2. No new cardiopulmonary abnormality identified. 3.  Stable lines and tubes. Electronically Signed   By: Genevie Ann M.D.   On: 02/20/2020 07:03   DG Chest Port 1 View  Result Date: 02/19/2020 CLINICAL DATA:  Post tracheostomy, COVID positive EXAM: PORTABLE CHEST 1 VIEW COMPARISON:  Earlier same day FINDINGS: Tracheostomy device is present approximately 5 cm above the carina. Left IJ central line and enteric tube are again identified. Persistent bilateral pulmonary opacities similar to the prior study. Probable small pleural effusions. No pneumothorax. Similar cardiomediastinal contours. IMPRESSION: New tracheostomy device. Otherwise stable lines and tubes. Similar bilateral pulmonary opacities. Electronically Signed   By: Macy Mis M.D.   On: 02/19/2020 16:48   DG Chest Port 1 View  Result Date: 02/19/2020 CLINICAL DATA:  Hypoxia.  Respiratory failure.  COVID-19. EXAM: PORTABLE CHEST 1 VIEW COMPARISON:  02/17/2020. FINDINGS: Endotracheal tube, feeding tube, left IJ line in stable position. Cardiomegaly with pulmonary venous congestion. Bilateral interstitial infiltrates again noted. Bibasilar atelectasis. Small bilateral pleural effusions again noted. No pneumothorax. IMPRESSION: 1. Lines and tubes in stable position. 2. Cardiomegaly with pulmonary venous congestion. 3. Bilateral interstitial infiltrates again noted. Bibasilar atelectasis.  Small bilateral pleural effusions again noted. Electronically Signed   By: Marcello Moores  Register   On: 02/19/2020 05:19   DG CHEST PORT 1 VIEW  Result Date: 02/17/2020 CLINICAL DATA:  Intubation EXAM: PORTABLE CHEST 1 VIEW COMPARISON:  02/16/2020 FINDINGS: No significant interval change in AP portable chest radiograph, with diffuse bilateral interstitial heterogeneous airspace opacity, most  conspicuous at the lung bases. Mild cardiomegaly. Support apparatus is unchanged including endotracheal tube, esophagogastric tube, and left neck vascular catheter. IMPRESSION: 1. No significant interval change in AP portable chest radiograph, with diffuse bilateral interstitial heterogeneous airspace opacity, most conspicuous at the lung bases. Findings are consistent with multifocal infection, edema, and/or ARDS. 2.  Unchanged support apparatus. Electronically Signed   By: Eddie Candle M.D.   On: 02/17/2020 11:26   DG CHEST PORT 1 VIEW  Result Date: 02/16/2020 CLINICAL DATA:  Altered mental status EXAM: PORTABLE CHEST 1 VIEW COMPARISON:  Yesterday FINDINGS: Endotracheal tube with tip halfway between the clavicular heads and carina. The feeding tube at least reaches the diaphragm. Left IJ line with tip at the brachiocephalic SVC confluence. Extensive bilateral pneumonia. Cardiomegaly. No visible air leak. IMPRESSION: Stable hardware positioning and extensive pneumonia. Electronically Signed   By: Monte Fantasia M.D.   On: 02/16/2020 07:08   DG CHEST PORT 1 VIEW  Result Date: 02/15/2020 CLINICAL DATA:  COVID positive.  Ventilator dependence. EXAM: PORTABLE CHEST 1 VIEW COMPARISON:  02/14/2020 FINDINGS: 0531 hours. Endotracheal tube tip is 2.7 cm above the base of the carina. A feeding tube passes into the stomach although the distal tip position is not included on the film. Left IJ central line tip overlies the innominate vein confluence. Stable asymmetric elevation right hemidiaphragm. Relatively diffuse bilateral airspace disease again noted with mid and lower lung predominance. The cardio pericardial silhouette is enlarged. Telemetry leads overlie the chest. IMPRESSION: No substantial interval change in exam. Electronically Signed   By: Misty Stanley M.D.   On: 02/15/2020 09:02   DG CHEST PORT 1 VIEW  Result Date: 02/14/2020 CLINICAL DATA:  ETT EXAM: PORTABLE CHEST 1 VIEW COMPARISON:  Radiograph  02/13/2020 FINDINGS: *Endotracheal tube tip low in the trachea, 2 cm from the carina. Consider retraction 1-2 cm to the mid trachea. *Transesophageal tube tip terminates near the region of the gastric antrum/duodenal bulb. *Left upper IJ approach central venous catheter tip terminates near the left brachiocephalic-caval confluence. Persistent diffuse heterogeneous opacities throughout both lungs in a mid to lower lung predominance, not significantly changed from 1 day prior. Cardiomegaly is stable. Indistinct vascularity. No pneumothorax. Obscuration of the hemidiaphragms may suggest some layering effusion. No acute osseous or soft tissue abnormality. Telemetry leads overlie the chest. IMPRESSION: 1. Endotracheal tube tip low in the trachea, 2 cm from the carina. Consider retraction 1-2 cm to the mid trachea. 2. Stable diffuse heterogeneous opacities throughout both lungs in a mid to lower lung predominance. Could reflect a combination of edema and infection with likely layering bilateral effusions. These results will be called to the ordering clinician or representative by the Radiologist Assistant, and communication documented in the PACS or [employer]. Electronically Signed   By: Lovena Le M.D.   On: 02/14/2020 05:39   DG CHEST PORT 1 VIEW  Result Date: 02/13/2020 CLINICAL DATA:  Hypoxia EXAM: PORTABLE CHEST 1 VIEW COMPARISON:  February 12, 2020 FINDINGS: Endotracheal tube tip is 1.7 cm above the carina. Enteric tube tip is below the diaphragm. Central catheter tip is in the left innominate vein near the  junction with the superior vena cava. No pneumothorax. There is airspace opacity in both mid and lower lung regions with small pleural effusions bilaterally. Heart is enlarged with pulmonary vascularity normal, stable. No adenopathy. No bone lesions. IMPRESSION: Tube and catheter positions as described without pneumothorax. Persistent airspace opacity in the mid and lower lung regions with bilateral  pleural effusions. Cardiomegaly. Overall appearance raises concern for a degree of congestive heart failure. There may well be superimposed pneumonia in the lung bases. Both edema and pneumonia may present concurrently. Electronically Signed   By: Lowella Grip III M.D.   On: 02/13/2020 08:03   DG CHEST PORT 1 VIEW  Result Date: 02/12/2020 CLINICAL DATA:  Endotracheal tube placement. EXAM: PORTABLE CHEST 1 VIEW COMPARISON:  02/11/2020. FINDINGS: Endotracheal tube and feeding tube in stable position. Left IJ line stable position. Cardiomegaly. Low lung volumes with bibasilar atelectasis. Diffuse bilateral pulmonary infiltrates/edema again noted. Small bilateral pleural effusions again noted. Chest is unchanged from prior exam. No pneumothorax. IMPRESSION: 1. Lines and tubes in stable position. 2. Cardiomegaly with persistent bilateral pulmonary infiltrates/edema and small bilateral pleural effusions. 3. Low lung volumes with bibasilar atelectasis. Chest is unchanged from prior exam. Electronically Signed   By: Marcello Moores  Register   On: 02/12/2020 05:21   DG CHEST PORT 1 VIEW  Result Date: 02/11/2020 CLINICAL DATA:  Hypoxia EXAM: PORTABLE CHEST 1 VIEW COMPARISON:  February 08, 2020 FINDINGS: Endotracheal tube tip is 3.0 cm above the carina. Enteric tube tip is below the diaphragm. Central catheter tip is in the superior vena cava slightly beyond the junction with the left innominate vein. No pneumothorax. There is cardiomegaly with pulmonary venous hypertension. There is multifocal airspace opacity throughout the lungs bilaterally with equivocal pleural effusions bilaterally. No bone lesions. No adenopathy appreciable. IMPRESSION: Tube and catheter positions as described without pneumothorax. Cardiomegaly with pulmonary vascular congestion. Small pleural effusions. Multifocal airspace opacity may represent pulmonary edema or multifocal pneumonia. Both entities may be present concurrently. Appearance overall  similar to recent study. Electronically Signed   By: Lowella Grip III M.D.   On: 02/11/2020 08:51   DG Chest Port 1 View  Result Date: 02/07/2020 CLINICAL DATA:  Acute respiratory failure. Pneumonia due to COVID-19. EXAM: PORTABLE CHEST 1 VIEW COMPARISON:  Radiograph yesterday. FINDINGS: Progressive heterogeneous bilateral airspace opacities, confluent in the lower lobes. Heart size grossly normal but obscured by adjacent airspace disease. Overall low lung volumes. No evidence of pneumomediastinum or pneumothorax. Soft tissue attenuation from habitus limits assessment. IMPRESSION: Bilateral COVID pneumonia which is progressed from yesterday. Low lung volumes persist. Electronically Signed   By: Keith Rake M.D.   On: 02/07/2020 15:36   DG Chest Port 1 View  Result Date: 02/06/2020 CLINICAL DATA:  Sepsis.  Encephalopathy. EXAM: PORTABLE CHEST 1 VIEW COMPARISON:  None. FINDINGS: Multifocal airspace opacity throughout both lungs. Small pleural effusions. No pneumothorax. IMPRESSION: Multifocal bilateral airspace opacities concerning for multifocal infection. Electronically Signed   By: Ulyses Jarred M.D.   On: 02/06/2020 19:36   DG Abd Portable 1V  Result Date: 02/29/2020 CLINICAL DATA:  Feeding tube placement EXAM: PORTABLE ABDOMEN - 1 VIEW COMPARISON:  1 day prior FINDINGS: Feeding tube terminates at the distal stomach or entering the duodenal bulb. Contrast within normal caliber colon. Right hemidiaphragm elevation. Bibasilar airspace disease. IMPRESSION: Feeding tube at the distal stomach or duodenal bulb. Electronically Signed   By: Abigail Miyamoto M.D.   On: 02/29/2020 12:03   DG Abd Portable 1V  Result Date:  02/28/2020 CLINICAL DATA:  OG tube placement. EXAM: PORTABLE ABDOMEN - 1 VIEW COMPARISON:  None. FINDINGS: Small bore feeding tube is noted with tip overlying the duodenum versus distal stomach. No dilated bowel loops are noted within the mid-UPPER abdomen. Contrast is noted within the  colon, from swallowing function study performed yesterday. IMPRESSION: Small bore feeding tube with tip overlying the duodenum/distal stomach. No dilated bowel loops identified on this exam. Electronically Signed   By: Margarette Canada M.D.   On: 02/28/2020 17:55   DG Swallowing Func-Speech Pathology  Result Date: 02/27/2020 Objective Swallowing Evaluation: Type of Study: MBS-Modified Barium Swallow Study  Patient Details Name: MECHELE KITTLESON MRN: [id number] Date of Birth: 01-11-1981 Today's Date: 02/27/2020 Time: SLP Start Time (ACUTE ONLY): 1358 -SLP Stop Time (ACUTE ONLY): 1420 SLP Time Calculation (min) (ACUTE ONLY): 22 min Past Medical History: No past medical history on file. Past Surgical History: No past surgical history on file. HPI: Pt is a 40 year old female with medical history significant for obesity.  She presented to [facility] ED on 12/15 with 24 to 48 hours of AMS following exposure to her COVID+ son. CXR showed diffuse bilateral airspace disease, she was confused and agitated. Physical restraints applied on admission and Precedex infusion given.  She required 100% nonrebreather mask and was transferred to [facility] for further intervention. CXR 12/29: Improved bilateral ventilation with residual confluent bibasilar opacity. CT head negative. ETT 12/17-trach 12/28. Stat CT head completed on 1/3 due to increased weakness noted by PT and was negative.  No data recorded Assessment / Plan / Recommendation CHL IP CLINICAL IMPRESSIONS 02/27/2020 Clinical Impression Pt demonstrates a moderate oropharyngeal dysphagia likely secondry to prolonged intubation with decreased glottic closure. Pt has adequate oral phase (though no upper dentition) and timely swallow, though there is silent aspiration during the swallow with thin and nectar thick liquids regardless of bolus size and despite a chin tuck. Pt was not wearing her PMSV during this exam (pt COVID positive and PMSV left in room) and Cortrak was also  in place. Suspect that pt may be able to quickly upgrade to nectar when cortrak comes out and pt has PMSV in place to aid in cough/throat clear response. For now pt able to initaite a dys 2/honey thick diet. WIll f/u for tolerance. SLP Visit Diagnosis Dysphagia, unspecified (R13.10) Attention and concentration deficit following -- Frontal lobe and executive function deficit following -- Impact on safety and function Moderate aspiration risk   CHL IP TREATMENT RECOMMENDATION 02/27/2020 Treatment Recommendations Therapy as outlined in treatment plan below   Prognosis 02/27/2020 Prognosis for Safe Diet Advancement Good Barriers to Reach Goals -- Barriers/Prognosis Comment -- CHL IP DIET RECOMMENDATION 02/27/2020 SLP Diet Recommendations Dysphagia 2 (Fine chop) solids;Honey thick liquids Liquid Administration via Cup;Straw Medication Administration Whole meds with puree Compensations Slow rate;Small sips/bites Postural Changes Seated upright at 90 degrees   CHL IP OTHER RECOMMENDATIONS 02/27/2020 Recommended Consults -- Oral Care Recommendations -- Other Recommendations Place PMSV during PO intake;Have oral suction available   CHL IP FOLLOW UP RECOMMENDATIONS 02/27/2020 Follow up Recommendations Inpatient Rehab   CHL IP FREQUENCY AND DURATION 02/27/2020 Speech Therapy Frequency (ACUTE ONLY) min 2x/week Treatment Duration 2 weeks      CHL IP ORAL PHASE 02/27/2020 Oral Phase Impaired Oral - Pudding Teaspoon -- Oral - Pudding Cup -- Oral - Honey Teaspoon WFL Oral - Honey Cup WFL Oral - Nectar Teaspoon WFL Oral - Nectar Cup WFL Oral - Nectar Straw WFL Oral -  Thin Teaspoon -- Oral - Thin Cup WFL Oral - Thin Straw WFL Oral - Puree WFL Oral - Mech Soft -- Oral - Regular -- Oral - Multi-Consistency -- Oral - Pill -- Oral Phase - Comment --  CHL IP PHARYNGEAL PHASE 02/27/2020 Pharyngeal Phase Impaired Pharyngeal- Pudding Teaspoon -- Pharyngeal -- Pharyngeal- Pudding Cup -- Pharyngeal -- Pharyngeal- Honey Teaspoon -- Pharyngeal -- Pharyngeal-  Honey Cup WFL Pharyngeal -- Pharyngeal- Nectar Teaspoon -- Pharyngeal -- Pharyngeal- Nectar Cup Penetration/Aspiration during swallow;Reduced airway/laryngeal closure Pharyngeal Material enters airway, passes BELOW cords without attempt by patient to eject out (silent aspiration) Pharyngeal- Nectar Straw Penetration/Aspiration during swallow;Reduced airway/laryngeal closure Pharyngeal Material enters airway, passes BELOW cords without attempt by patient to eject out (silent aspiration) Pharyngeal- Thin Teaspoon -- Pharyngeal -- Pharyngeal- Thin Cup Penetration/Aspiration during swallow Pharyngeal Material enters airway, passes BELOW cords without attempt by patient to eject out (silent aspiration) Pharyngeal- Thin Straw Penetration/Aspiration during swallow Pharyngeal Material enters airway, passes BELOW cords without attempt by patient to eject out (silent aspiration) Pharyngeal- Puree WFL Pharyngeal -- Pharyngeal- Mechanical Soft WFL Pharyngeal -- Pharyngeal- Regular -- Pharyngeal -- Pharyngeal- Multi-consistency -- Pharyngeal -- Pharyngeal- Pill -- Pharyngeal -- Pharyngeal Comment --  No flowsheet data found. DeBlois, Katherene Ponto 02/27/2020, 2:47 PM              EEG adult  Result Date: 02/17/2020 Lora Havens, MD     02/17/2020  3:05 PM Patient Name: QUANTA ROBERTSHAW MRN: [id number] Epilepsy Attending: Lora Havens Referring Physician/Provider: Dr Brand Males Date: 02/17/2020 Duration: 24.30 mins Patient history: 40yo F with ams in setting of sepsis and Covid. EEG to evaluate for seizure Level of alertness:  comatose AEDs during EEG study: Versed Technical aspects: This EEG study was done with scalp electrodes positioned according to the 10-20 International system of electrode placement. Electrical activity was acquired at a sampling rate of 500Hz and reviewed with a high frequency filter of 70Hz and a low frequency filter of 1Hz. EEG data were recorded continuously and digitally stored.  Description: EEG showed continuous generalized 3 to 6 Hz theta-delta slowing admixed with an excessive amount of 15 to 18 Hz beta activity distributed symmetrically and diffusely.  Hyperventilation and photic stimulation were not performed.   ABNORMALITY -Excessive beta, generalized -Continuous slow, generalized IMPRESSION: This study is suggestive of severe diffuse encephalopathy, nonspecific etiology but likely related to sedation. No seizures or epileptiform discharges were seen throughout the recording. Lora Havens   ECHOCARDIOGRAM COMPLETE  Result Date: 02/12/2020    ECHOCARDIOGRAM REPORT   Patient Name:   KEVYN WENGERT Date of Exam: 02/12/2020 Medical Rec #:  [id number]        Height:       63.0 in Accession #:    [id number]       Weight:       240.5 lb Date of Birth:  [birth year]        BSA:          2.091 m Patient Age:    85 years         BP:           99/70 mmHg Patient Gender: F                HR:           54 bpm. Exam Location:  Inpatient Procedure: 2D Echo, Cardiac Doppler and Color Doppler Indications:    Acute respiratory distress R06.03  History:  Patient has no prior history of Echocardiogram examinations.  Sonographer:    Clayton Lefort RDCS (AE) Referring Phys: [id number] [facility]  Sonographer Comments: Image acquisition challenging due to patient body habitus. IMPRESSIONS  1. Left ventricular ejection fraction, by estimation, is 55 to 60%. The left ventricle has normal function. The left ventricle has no regional wall motion abnormalities. Left ventricular diastolic parameters were normal.  2. Right ventricular systolic function is normal. The right ventricular size is normal. The estimated right ventricular systolic pressure is 83.3 mmHg.  3. The mitral valve is grossly normal. Trivial mitral valve regurgitation.  4. The aortic valve is tricuspid. Aortic valve regurgitation is not visualized.  5. The inferior vena cava is normal in size with greater than 50% respiratory  variability, suggesting right atrial pressure of 3 mmHg. Conclusion(s)/Recommendation(s): Normal biventricular function without evidence of hemodynamically significant valvular heart disease. FINDINGS  Left Ventricle: Left ventricular ejection fraction, by estimation, is 55 to 60%. The left ventricle has normal function. The left ventricle has no regional wall motion abnormalities. The left ventricular internal cavity size was normal in size. There is  no left ventricular hypertrophy. Left ventricular diastolic parameters were normal. Right Ventricle: The right ventricular size is normal. No increase in right ventricular wall thickness. Right ventricular systolic function is normal. The tricuspid regurgitant velocity is 2.26 m/s, and with an assumed right atrial pressure of 3 mmHg, the estimated right ventricular systolic pressure is 82.5 mmHg. Left Atrium: Left atrial size was normal in size. Right Atrium: Right atrial size was normal in size. Pericardium: There is no evidence of pericardial effusion. Mitral Valve: The mitral valve is grossly normal. Trivial mitral valve regurgitation. Tricuspid Valve: The tricuspid valve is grossly normal. Tricuspid valve regurgitation is trivial. Aortic Valve: The aortic valve is tricuspid. Aortic valve regurgitation is not visualized. Aortic valve mean gradient measures 3.0 mmHg. Aortic valve peak gradient measures 5.8 mmHg. Aortic valve area, by VTI measures 2.60 cm. Pulmonic Valve: The pulmonic valve was grossly normal. Pulmonic valve regurgitation is trivial. Aorta: The aortic root and ascending aorta are structurally normal, with no evidence of dilitation. Venous: The inferior vena cava is normal in size with greater than 50% respiratory variability, suggesting right atrial pressure of 3 mmHg. IAS/Shunts: No atrial level shunt detected by color flow Doppler.  LEFT VENTRICLE PLAX 2D LVIDd:         5.20 cm  Diastology LVIDs:         3.80 cm  LV e' medial:    9.90 cm/s LV PW:          1.30 cm  LV E/e' medial:  7.5 LV IVS:        1.00 cm  LV e' lateral:   10.10 cm/s LVOT diam:     2.20 cm  LV E/e' lateral: 7.4 LV SV:         67 LV SV Index:   32 LVOT Area:     3.80 cm  RIGHT VENTRICLE             IVC RV Basal diam:  2.60 cm     IVC diam: 1.10 cm RV S prime:     11.10 cm/s TAPSE (M-mode): 1.9 cm LEFT ATRIUM             Index       RIGHT ATRIUM           Index LA diam:        2.90 cm 1.39 cm/m  RA Area:     14.60 cm LA Vol (A2C):   49.8 ml 23.81 ml/m RA Volume:   35.90 ml  17.17 ml/m LA Vol (A4C):   37.8 ml 18.07 ml/m LA Biplane Vol: 46.4 ml 22.19 ml/m  AORTIC VALVE AV Area (Vmax):    2.65 cm AV Area (Vmean):   2.80 cm AV Area (VTI):     2.60 cm AV Vmax:           120.00 cm/s AV Vmean:          76.100 cm/s AV VTI:            0.259 m AV Peak Grad:      5.8 mmHg AV Mean Grad:      3.0 mmHg LVOT Vmax:         83.60 cm/s LVOT Vmean:        56.100 cm/s LVOT VTI:          0.177 m LVOT/AV VTI ratio: 0.68  AORTA Ao Root diam: 3.30 cm Ao Asc diam:  2.60 cm MITRAL VALVE               TRICUSPID VALVE MV Area (PHT): 3.21 cm    TR Peak grad:   20.4 mmHg MV Decel Time: 236 msec    TR Vmax:        226.00 cm/s MV E velocity: 74.60 cm/s MV A velocity: 40.70 cm/s  SHUNTS MV E/A ratio:  1.83        Systemic VTI:  0.18 m                            Systemic Diam: 2.20 cm Lyman Bishop MD Electronically signed by Lyman Bishop MD Signature Date/Time: 02/12/2020/6:17:29 PM    Final    CT HEAD CODE STROKE WO CONTRAST`  Result Date: 02/25/2020 CLINICAL DATA:  Code stroke.  Left-sided weakness EXAM: CT HEAD WITHOUT CONTRAST TECHNIQUE: Contiguous axial images were obtained from the base of the skull through the vertex without intravenous contrast. COMPARISON:  None. FINDINGS: Brain: There is no acute intracranial hemorrhage, mass effect, or edema. Gray-white differentiation is preserved. No extra-axial collection. Ventricles and sulci are normal in size and configuration. Vascular: No hyperdense vessel.  Skull: Unremarkable. Sinuses/Orbits: Nonspecific paranasal sinus opacification primarily involving right maxillary and both sphenoid sinuses. There is thinning or dehiscence of the inferior right maxillary sinus wall. Orbits are unremarkable. Other: Minimal mastoid opacification. ASPECTS ([place] Stroke Program Early CT Score) - Ganglionic level infarction (caudate, lentiform nuclei, internal capsule, insula, M1-M3 cortex): 7 - Supraganglionic infarction (M4-M6 cortex): 3 Total score (0-10 with 10 being normal): 10 IMPRESSION: There is no acute intracranial hemorrhage or evidence of acute infarction. ASPECT score is 10. These results were communicated to Dr. Lorrin Goodell at 12:15 pm on 02/25/2020 by text page via the [facility] messaging system. Electronically Signed   By: Macy Mis M.D.   On: 02/25/2020 12:36   VAS [place] LOWER EXTREMITY VENOUS (DVT)  Result Date: 02/18/2020  Lower Venous DVT Study Indications: Covid-19, elevated D-Dimer.  Comparison Study: No prior study Performing Technologist: Sharion Dove RVS  Examination Guidelines: A complete evaluation includes B-mode imaging, spectral Doppler, color Doppler, and power Doppler as needed of all accessible portions of each vessel. Bilateral testing is considered an integral part of a complete examination. Limited examinations for reoccurring indications may be performed as noted. The reflux portion of the exam is performed with the patient  in reverse Trendelenburg.  +---------+---------------+---------+-----------+----------+--------------+  RIGHT     Compressibility Phasicity Spontaneity Properties Thrombus Aging  +---------+---------------+---------+-----------+----------+--------------+  CFV       Full            Yes       Yes                                    +---------+---------------+---------+-----------+----------+--------------+  SFJ       Full                                                              +---------+---------------+---------+-----------+----------+--------------+  FV Prox   Full                                                             +---------+---------------+---------+-----------+----------+--------------+  FV Mid    Full                                                             +---------+---------------+---------+-----------+----------+--------------+  FV Distal Full                                                             +---------+---------------+---------+-----------+----------+--------------+  PFV       Full                                                             +---------+---------------+---------+-----------+----------+--------------+  POP       Full            Yes       Yes                                    +---------+---------------+---------+-----------+----------+--------------+  PTV       Full                                                             +---------+---------------+---------+-----------+----------+--------------+  PERO      Full                                                             +---------+---------------+---------+-----------+----------+--------------+   +---------+---------------+---------+-----------+----------+--------------+  LEFT      Compressibility Phasicity Spontaneity Properties Thrombus Aging  +---------+---------------+---------+-----------+----------+--------------+  CFV       Full            Yes       Yes                                    +---------+---------------+---------+-----------+----------+--------------+  SFJ       Full                                                             +---------+---------------+---------+-----------+----------+--------------+  FV Prox   Full                                                             +---------+---------------+---------+-----------+----------+--------------+  FV Mid    Full                                                              +---------+---------------+---------+-----------+----------+--------------+  FV Distal Full                                                             +---------+---------------+---------+-----------+----------+--------------+  PFV       Full                                                             +---------+---------------+---------+-----------+----------+--------------+  POP       Full            Yes       Yes                                    +---------+---------------+---------+-----------+----------+--------------+  PTV       Full                                                             +---------+---------------+---------+-----------+----------+--------------+  PERO      Full                                                             +---------+---------------+---------+-----------+----------+--------------+  Summary: BILATERAL: - No evidence of deep vein thrombosis seen in the lower extremities, bilaterally. -No evidence of popliteal cyst, bilaterally.   *See table(s) above for measurements and observations. Electronically signed by Ruta Hinds MD on 02/18/2020 at 5:24:02 PM.    Final

## 2020-03-03 NOTE — Progress Notes (Signed)
°  Speech Language Pathology Treatment: Dysphagia;[place] Speaking valve  Patient Details Name: Belinda [name] MRN: [id number] DOB: Jul 18, 1980 Today's Date: 03/03/2020 Time: 2334-3568 SLP Time Calculation (min) (ACUTE ONLY): 14 min  Assessment / Plan / Recommendation Clinical Impression  Pt had PMV in place upon SLP arrival and phonating through short phrases and sentences. Voice is dysphonic but with Min-Mod cues for increased breath support and chunking phrases, she can increase her volume and intelligibility well. Pt was very pleased with her results. She consumed very small amounts of POs today because she reported having "gas pain." She took only a few spoonfuls of nectar and honey thick liquids. She declined even very small bites of softened foods because of her missing dentition, and said that she did not want to upgrade beyond Dys 2 until she could pick up her partial dentures from the dentist. No diet changes today in light of this and limited visibility with advanced liquids. Given improvements in vocal quality as well, pt may benefit from [facility] soon for potential advancement.    HPI HPI: Pt is a 40 year old female with medical history significant for obesity.  She presented to [facility] ED on 12/15 with 24 to 48 hours of AMS following exposure to her COVID+ son. CXR showed diffuse bilateral airspace disease, she was confused and agitated. Physical restraints applied on admission and Precedex infusion given.  She required 100% nonrebreather mask and was transferred to [facility] for further intervention. CXR 12/29: Improved bilateral ventilation with residual confluent bibasilar opacity. CT head negative. ETT 12/17-trach 12/28. Stat CT head completed on 1/3 due to increased weakness noted by PT and was negative.      SLP Plan  Continue with current plan of care       Recommendations  Diet recommendations: Dysphagia 2 (fine chop);Honey-thick liquid Liquids provided via:  Cup;Straw Medication Administration: Whole meds with puree Supervision: Patient able to self feed Compensations: Slow rate;Small sips/bites      Patient may use Passy-Muir Speech Valve: During all therapies with supervision;Intermittently with supervision PMSV Supervision: Full MD: Please consider changing trach tube to : Smaller size;Cuffless         Oral Care Recommendations: Oral care BID Follow up Recommendations: Inpatient Rehab SLP Visit Diagnosis: Dysphagia, unspecified (R13.10) Plan: Continue with current plan of care       GO                Osie Bond., M.A. [facility] Pager [phone] Office [phone]  03/03/2020, 9:51 AM

## 2020-03-03 NOTE — Progress Notes (Signed)
Inpatient Rehabilitation Admissions Coordinator  I have received insurance approval for CIR admit, bu no bed available today. We will follow up with bed availability.  Danne Baxter, RN, MSN Rehab Admissions Coordinator [phone] 03/03/2020 3:04 PM

## 2020-03-04 ENCOUNTER — Encounter (HOSPITAL_COMMUNITY): Payer: Self-pay | Admitting: Pulmonary Disease

## 2020-03-04 DIAGNOSIS — J9601 Acute respiratory failure with hypoxia: Secondary | ICD-10-CM | POA: Diagnosis not present

## 2020-03-04 DIAGNOSIS — G9341 Metabolic encephalopathy: Secondary | ICD-10-CM | POA: Diagnosis not present

## 2020-03-04 LAB — CBC
HCT: 31 % — ABNORMAL LOW (ref 36.0–46.0)
Hemoglobin: 9.9 g/dL — ABNORMAL LOW (ref 12.0–15.0)
MCH: 29.9 pg (ref 26.0–34.0)
MCHC: 31.9 g/dL (ref 30.0–36.0)
MCV: 93.7 fL (ref 80.0–100.0)
Platelets: 309 10*3/uL (ref 150–400)
RBC: 3.31 MIL/uL — ABNORMAL LOW (ref 3.87–5.11)
RDW: 13.7 % (ref 11.5–15.5)
WBC: 7.1 10*3/uL (ref 4.0–10.5)
nRBC: 0.3 % — ABNORMAL HIGH (ref 0.0–0.2)

## 2020-03-04 LAB — BASIC METABOLIC PANEL
Anion gap: 12 (ref 5–15)
BUN: 16 mg/dL (ref 6–20)
CO2: 25 mmol/L (ref 22–32)
Calcium: 8.6 mg/dL — ABNORMAL LOW (ref 8.9–10.3)
Chloride: 98 mmol/L (ref 98–111)
Creatinine, Ser: 0.61 mg/dL (ref 0.44–1.00)
GFR, Estimated: >60 mL/min (ref >60)
Glucose, Bld: 125 mg/dL — ABNORMAL HIGH (ref 70–99)
Potassium: 3.7 mmol/L (ref 3.5–5.1)
Sodium: 135 mmol/L (ref 135–145)

## 2020-03-04 LAB — GLUCOSE, CAPILLARY
Glucose-Capillary: 103 mg/dL — ABNORMAL HIGH (ref 70–99)
Glucose-Capillary: 104 mg/dL — ABNORMAL HIGH (ref 70–99)
Glucose-Capillary: 118 mg/dL — ABNORMAL HIGH (ref 70–99)
Glucose-Capillary: 123 mg/dL — ABNORMAL HIGH (ref 70–99)
Glucose-Capillary: 131 mg/dL — ABNORMAL HIGH (ref 70–99)
Glucose-Capillary: 78 mg/dL (ref 70–99)

## 2020-03-04 MED ORDER — VITAL 1.5 CAL PO LIQD
780.0000 mL | ORAL | Status: DC
  Administered 2020-03-04: 780 mL
  Filled 2020-03-04 (×2): qty 948

## 2020-03-04 MED ORDER — METHADONE HCL 10 MG PO TABS
10.0000 mg | ORAL_TABLET | Freq: Two times a day (BID) | ORAL | Status: DC
Start: 2020-03-04 — End: 2020-03-05
  Administered 2020-03-04 – 2020-03-05 (×2): 10 mg via ORAL
  Filled 2020-03-04 (×2): qty 1

## 2020-03-04 MED ORDER — METHADONE HCL 10 MG PO TABS
5.0000 mg | ORAL_TABLET | Freq: Every day | ORAL | Status: DC
  Administered 2020-03-04: 5 mg via ORAL
  Filled 2020-03-04: qty 1

## 2020-03-04 MED ORDER — METHADONE HCL 10 MG PO TABS
5.0000 mg | ORAL_TABLET | Freq: Once | ORAL | Status: AC
Start: 2020-03-04 — End: 2020-03-04
  Administered 2020-03-04: 5 mg via ORAL
  Filled 2020-03-04: qty 1

## 2020-03-04 NOTE — Progress Notes (Signed)
PROGRESS NOTE                                                                             PROGRESS NOTE                                                                                                                                                                                                             Patient Demographics:    Belinda [name], is a 40 y.o. female, DOB - 04/03/1980, ZOX:[id number]  Outpatient Primary MD for the patient is Kathyrn Drown, MD    LOS - 34  Admit date - 02/06/2020    Chief Complaint  Patient presents with   Altered Mental Status       Brief Narrative    39/F with history of anxiety and GERD presented to [facility], ER on 12/15 with severe hypoxic respiratory failure, ARDS and metabolic encephalopathy secondary to Covid she was intubated and admitted to the ICU  -Subsequently has completed Covid treatment, had prolonged respiratory failure and eventually had a tracheostomy on 12/28 -During hospitalization there was concern for opiate versus benzo withdrawal, she was started on methadone and scheduled Dilaudid by CCM team  -Now stable on trach collar, weaned off ventilator, on tube feeds  Via cortrak    Subjective:   -Multiple complaints today, complains of anxiety, hurts all over, did not sleep, multiple bowel movements   Assessment  & Plan :   Acute hypoxic respiratory failure/ARDS/status post tracheostomy  -Prolonged respiratory failure secondary to ARDS from Covid -Underwent tracheostomy placement on 12/28 -Subsequently has been weaned off the ventilator -Also treated for aspiration pneumonia -Will ask pulmonary to reevaluate for trach management, possibly downsize -SLP following, recommended dysphagia 2 diet, oral intake has been very poor,  Currently on tube feeds at night  Pneumonia due to COVID-19  -Completed treatment with IV remdesivir, steroids and baricitinib -Came off Covid isolation on  1/6  Klebsiella urinary tract infection with sepsis syndrome suspected aspiration pneumonia  Vs VAP -Completed Unasyn course on 12/20  -  Resp Cx MSSA on respiratory culture, completed cefazolin 02/23/18/2022 -Stable off antibiotics at this time  Acute metabolic encephalopathy -Improved, this is secondary to Covid pneumonia, prolonged respiratory failure, meds, possibly also component of benzo withdrawal  -Started on Risperdal in the ICU for agitation, reports involuntary jerks now possibly extrapyramidal symptoms, will taper off  -Resumed Xanax per home regimen  -Started methadone in the ICU, see below  Anxiety Polypharmacy -At home on Xanax 4 times daily as needed, per chart review Father reported using Suboxone, d/w mother-"reports on so going suboxone atleast for 1 year low-dose -She was started on scheduled methadone per CCM along with scheduled hydromorphone -Continue methadone and oral Dilaudid, will attempt to wean down Dilaudid as tolerated  Volume overload ECHO normal 02/12/20.  -Diuresed with IV Lasix early this admission, now euvolemic continue oral Lasix  Hypophosphatemia Replete as needed  Anemia of critical illness Hemoglobin stable.  No evidence of overt bleeding. Anemia panel reviewed. Folate was 18.9, B12 590, ferritin 630.  Nutrition/dysphagia -SLP following, continue dysphagia 2 diet, nectar thick liquids -Gradually wean off tube feeds and advance diet as tolerated  Prediabetes with Hyperglycemia HbA1c 5.8.   -CBGs high due to tube feeds, currently stable on Levemir and NovoLog, will cut this down as tube feeds are weaned off  Hypernatrmia -Resolved  Concern for pituitary lesion This was incidentally noted on MRI brain.  Will need a dedicated pituitary protocol MRI when patient is more stable.  Thyroid function tests were normal: TSH 3.45 and free T4 1.03.   Luteinizing hormone level noted to be 0.6.  Difficult to clinically interpret this level at this  time.   Prolactin level significantly elevated at 115.  Normal range is 4.8-23.3.  Patient however noted to be on Risperdal which can increase the level of prolactin.   IGF-I/Somatomedin-C level was 121 which is in the normal range, plasma ACTH also within normal range -Needs endocrinology follow-up   DVT prophylaxis: Lovenox CODE STATUS: Full code Family communication: Discussed with mother at bedside Disposition: Will likely need to go to rehabilitation when medically stable.  Status is: Inpatient  Remains inpatient appropriate because:IV treatments appropriate due to intensity of illness or inability to take PO and Inpatient level of care appropriate due to severity of illness   Dispo: The patient is from: Home              Anticipated d/c is to: CIR              Anticipated d/c date is: Likely 48 hours              Patient currently is not medically stable to d/c.   Consults  :  PCCM, Neurology  Procedures  :  Trach  Inpatient Medications  Scheduled Meds:  chlorhexidine gluconate (MEDLINE KIT)  15 mL Mouth Rinse BID   Chlorhexidine Gluconate Cloth  6 each Topical Daily   clobetasol cream  1 application Topical BID   enoxaparin (LOVENOX) injection  0.5 mg/kg Subcutaneous Q24H   feeding supplement  237 mL Oral BID BM   feeding supplement (PROSource TF)  45 mL Per Tube TID   feeding supplement (VITAL 1.5 CAL)  1,000 mL Per Tube Q24H   furosemide  20 mg Oral Daily   insulin aspart  0-20 Units Subcutaneous Q4H   insulin detemir  5 Units Subcutaneous BID   mouth rinse  15 mL Mouth Rinse 10 times per day   melatonin  3 mg Oral QHS  methadone  10 mg Oral Q12H   multivitamin with minerals  1 tablet Oral Daily   pantoprazole  40 mg Oral BID AC   thiamine  100 mg Oral Daily   Continuous Infusions:  sodium chloride Stopped (02/25/20 0849)   lactated ringers 10 mL/hr at 02/08/20 1118   PRN Meds:.sodium chloride, acetaminophen, ALPRAZolam, guaiFENesin,  HYDROmorphone, ipratropium-albuterol, ondansetron (ZOFRAN) IV, Resource ThickenUp Clear  Antibiotics  :    Anti-infectives (From admission, onward)   Start     Dose/Rate Route Frequency Ordered Stop   02/18/20 1200  ceFAZolin (ANCEF) IVPB 2g/100 mL premix        2 g 200 mL/hr over 30 Minutes Intravenous Every 8 hours 02/18/20 1104 02/23/20 2044   02/17/20 2000  vancomycin (VANCOCIN) IVPB 1000 mg/200 mL premix  Status:  Discontinued        1,000 mg 200 mL/hr over 60 Minutes Intravenous Every 8 hours 02/17/20 1146 02/18/20 1104   02/17/20 1345  vancomycin (VANCOREADY) IVPB 2000 mg/400 mL        2,000 mg 200 mL/hr over 120 Minutes Intravenous  Once 02/17/20 1257 02/17/20 1752   02/17/20 1245  vancomycin (VANCOCIN) 2,000 mg in sodium chloride 0.9 % 500 mL IVPB  Status:  Discontinued        2,000 mg 250 mL/hr over 120 Minutes Intravenous  Once 02/17/20 1146 02/17/20 1445   02/07/20 1600  Ampicillin-Sulbactam (UNASYN) 3 g in sodium chloride 0.9 % 100 mL IVPB        3 g 200 mL/hr over 30 Minutes Intravenous Every 6 hours 02/07/20 1513 02/11/20 2145   02/07/20 1000  remdesivir 100 mg in sodium chloride 0.9 % 100 mL IVPB        100 mg 200 mL/hr over 30 Minutes Intravenous Daily 02/06/20 2132 02/10/20 1103   02/07/20 0800  vancomycin (VANCOCIN) IVPB 750 mg/150 ml premix  Status:  Discontinued        750 mg 150 mL/hr over 60 Minutes Intravenous Every 12 hours 02/06/20 2023 02/07/20 1510   02/07/20 0400  ceFEPIme (MAXIPIME) 2 g in sodium chloride 0.9 % 100 mL IVPB  Status:  Discontinued        2 g 200 mL/hr over 30 Minutes Intravenous Every 8 hours 02/06/20 2023 02/07/20 1510   02/06/20 2145  remdesivir 100 mg in sodium chloride 0.9 % 100 mL IVPB        100 mg 200 mL/hr over 30 Minutes Intravenous Every 30 min 02/06/20 2132 02/06/20 2305   02/06/20 1915  vancomycin (VANCOREADY) IVPB 2000 mg/400 mL        2,000 mg 200 mL/hr over 120 Minutes Intravenous  Once 02/06/20 1907 02/06/20 2250    02/06/20 1845  ceFEPIme (MAXIPIME) 2 g in sodium chloride 0.9 % 100 mL IVPB        2 g 200 mL/hr over 30 Minutes Intravenous  Once 02/06/20 1834 02/06/20 2056   02/06/20 1845  metroNIDAZOLE (FLAGYL) IVPB 500 mg        500 mg 100 mL/hr over 60 Minutes Intravenous  Once 02/06/20 1834 02/06/20 2204   02/06/20 1845  vancomycin (VANCOCIN) IVPB 1000 mg/200 mL premix  Status:  Discontinued        1,000 mg 200 mL/hr over 60 Minutes Intravenous  Once 02/06/20 1834 02/06/20 1907       Domenic Polite M.D on 03/04/2020 at 12:02 PM  To page go to [email or web address]   [facility] -  Office  [phone]  Objective:   Vitals:   03/04/20 0550 03/04/20 0818 03/04/20 0819 03/04/20 1140  BP:  108/72    Pulse:      Resp: (!) 28 (!) 23    Temp:  98.7 F (37.1 C)    TempSrc:  Oral    SpO2:  99% 99% 99%  Weight: 96.4 kg     Height:        Wt Readings from Last 3 Encounters:  03/04/20 96.4 kg     Intake/Output Summary (Last 24 hours) at 03/04/2020 1202 Last data filed at 03/04/2020 0600 Gross per 24 hour  Intake 1080 ml  Output --  Net 1080 ml     Physical Exam  General: Anxious obese female sitting in bed, x3  HEENT: Tracheostomy with trach collar  CVS: S1S2/RRR Lungs:  scattered Rhonchi, conducted upper lung sounds Abd:  soft, nontender, bowel sounds present Extremities: No edema Neuro: Moves all extremities, no localizing signs   Data Review:    CBC Recent Labs  Lab 02/29/20 0029 03/01/20 0044 03/02/20 0041 03/03/20 0026 03/04/20 0013  WBC 8.2 9.3 8.3 6.9 7.1  HGB 10.5* 10.2* 9.6* 9.8* 9.9*  HCT 33.1* 30.5* 29.5* 29.0* 31.0*  PLT 223 215 196 254 309  MCV 94.6 92.4 91.6 91.8 93.7  MCH 30.0 30.9 29.8 31.0 29.9  MCHC 31.7 33.4 32.5 33.8 31.9  RDW 13.7 13.5 13.5 13.5 13.7    Recent Labs  Lab 02/28/20 0614 02/29/20 0029 03/01/20 0044 03/02/20 0041 03/03/20 0026 03/04/20 0013  NA 143 136 134* 134* 135 135  K 4.0 3.5 3.7 3.8 3.6 3.7  CL 98 95* 96* 98  97* 98  CO2 _0 GLUCOSE 96 105* 128* 147* 126* 125*  BUN 39* 28* 24* _1 CREATININE 0.79 0.75 0.67 0.70 0.60 0.61  CALCIUM 9.2 8.9 8.5* 8.3* 8.4* 8.6*  AST 62* 63* 45* 35 31  --   ALT 116* 109* 88* 67* 54*  --   ALKPHOS 100 94 88 79 76  --   BILITOT 0.8 1.0 0.8 0.6 0.6  --   ALBUMIN 3.1* 3.0* 2.7* 2.8* 2.6*  --   MG  --   --  2.2  --   --   --   TSH 3.450  --   --   --   --   --       Lab Results  Component Value Date   HGBA1C 5.8 (H) 02/08/2020    Micro Results No results found for this or any previous visit (from the past 240 hour(s)).  Radiology Reports DG Chest 1 View  Result Date: 02/08/2020 CLINICAL DATA:  OG tube placement. EXAM: CHEST  1 VIEW COMPARISON:  Chest x-ray 02/07/2020. FINDINGS: 539 hours. Bilateral airspace disease noted, left greater than right. Endotracheal tube tip is approximately 4.3 cm above the base of the carina. Left IJ central line tip overlies the innominate vein confluence. NG tube tip is positioned in the stomach with the proximal side port below the GE junction. Telemetry leads overlie the chest. IMPRESSION: NG tube tip is in the stomach with proximal side port of the tube below the GE junction. Electronically Signed   By: Misty Stanley M.D.   On: 02/08/2020 05:54   CT HEAD WO CONTRAST  Result Date: 02/18/2020 CLINICAL DATA:  Delirium. Altered mental status. EXAM: CT HEAD WITHOUT CONTRAST TECHNIQUE: Contiguous axial images were obtained from the base of the skull through the vertex  without intravenous contrast. COMPARISON:  CT head without contrast 09/01/2017 at [facility]. FINDINGS: Brain: No acute infarct, hemorrhage, or mass lesion is present. No significant white matter lesions are present. The ventricles are of normal size. No significant extraaxial fluid collection is present. The brainstem and cerebellum are within normal limits. Vascular: No hyperdense vessel or unexpected calcification. Skull: Calvarium is  intact. No focal lytic or blastic lesions are present. No significant extracranial soft tissue lesion is present. Sinuses/Orbits: Patient is intubated. Fluid level is present in the right maxillary sinus. Fluid is present in the nasopharynx. Fluid is present in the sphenoid sinuses and posterior ethmoid air cells bilaterally. Left nasogastric tube is noted. Minimal mastoid fluid is present inferiorly on both sides. IMPRESSION: 1. Normal CT appearance of the brain. 2. Fluid in the paranasal sinuses and nasopharynx likely related to intubation. 3. Minimal mastoid fluid inferiorly on both sides. Electronically Signed   By: San Morelle M.D.   On: 02/18/2020 02:16   CT Head Wo Contrast  Result Date: 02/06/2020 CLINICAL DATA:  Altered mental status.  Recent tooth extraction. EXAM: CT HEAD WITHOUT CONTRAST TECHNIQUE: Contiguous axial images were obtained from the base of the skull through the vertex without intravenous contrast. COMPARISON:  Head CT 09/01/2017 FINDINGS: Brain: No intracranial hemorrhage, mass effect, or midline shift. No hydrocephalus. The basilar cisterns are patent. No evidence of territorial infarct or acute ischemia. No extra-axial or intracranial fluid collection. Vascular: No hyperdense vessel or unexpected calcification. Skull: No fracture or focal lesion. Sinuses/Orbits: Mucosal thickening of the right greater than left maxillary sinus. Scattered mucosal thickening throughout ethmoid air cells. Patient appears edentulous of upper teeth, presumed prior periapical lucency in the right upper molars, contiguous with the right maxillary sinus. No acute orbital abnormality. Mastoid air cells are clear. Other: None. IMPRESSION: 1. No acute intracranial abnormality. 2. Paranasal sinus disease. 3. Patient appears edentulous of upper teeth, presumed prior periapical lucency in the right upper molars, contiguous with the right maxillary sinus. CLINICAL DATA:  Altered mental status. Recent tooth  extraction. EXAM: CT HEAD WITHOUT CONTRAST TECHNIQUE: Contiguous axial images were obtained from the base of the skull through the vertex without intravenous contrast. COMPARISON:  Head CT 09/01/2017 FINDINGS: Brain: No intracranial hemorrhage, mass effect, or midline shift. No hydrocephalus. The basilar cisterns are patent. No evidence of territorial infarct or acute ischemia. No extra-axial or intracranial fluid collection. Vascular: No hyperdense vessel or unexpected calcification. Skull: No fracture or focal lesion. Sinuses/Orbits: Mucosal thickening of the right greater than left maxillary sinus. Scattered mucosal thickening throughout ethmoid air cells. Patient appears edentulous of upper teeth, presumed prior periapical lucency in the right upper molars, contiguous with the right maxillary sinus. No acute orbital abnormality. Mastoid air cells are clear. Other: None. IMPRESSION: 1. No acute intracranial abnormality. 2. Paranasal sinus mucosal thickening. 3. Patient appears edentulous of upper teeth, presumed prior periodontal disease in the right upper molars, contiguous with the right maxillary sinus. Electronically Signed   By: Keith Rake M.D.   On: 02/06/2020 22:18   MR BRAIN WO CONTRAST  Result Date: 02/25/2020 CLINICAL DATA:  Neuro deficit, acute, stroke suspected. Additional history provided: COVID positive. EXAM: MRI HEAD WITHOUT CONTRAST TECHNIQUE: Multiplanar, multiecho pulse sequences of the brain and surrounding structures were obtained without intravenous contrast. COMPARISON:  Noncontrast head CT 02/25/2020. FINDINGS: Brain: Intermittently motion degraded exam. Most notably, there is moderate motion degradation of the axial T2/FLAIR sequence. Small focus of chronic cortical encephalomalacia within the anterolateral right  frontal lobe (for instance as seen on series 7, images 14-16). 2 x 10 mm (AP x TV) T1 hyperintense lesion within the mid to posterior pituitary gland (for instance as  seen on series 10, image 36). No focal parenchymal signal abnormality is identified elsewhere. There is no acute infarct. No extra-axial fluid collection. No midline shift. Vascular: Expected proximal arterial flow voids. Skull and upper cervical spine: No focal marrow lesion. Sinuses/Orbits: Visualized orbits show no acute finding. Paranasal sinus disease. Most notably, there is extensive partial opacification of the bilateral sphenoid sinuses and moderate right maxillary sinus mucosal thickening. Other: Small bilateral mastoid effusions. IMPRESSION: 1. Intermittently motion degraded exam. 2. No evidence of acute infarct. 3. Small focus of chronic cortical encephalomalacia within the anterolateral right frontal lobe. 4. 2 x 10 mm T1 hyperintense lesion within the mid-to-posterior pituitary gland. 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 However, a cystic adenoma or adenoma with prior hemorrhage cannot be excluded. Correlate with relevant laboratory values. A pituitary protocol brain MRI may be obtained for further characterization, if clinically warranted. 5. Otherwise unremarkable non-contrast MRI appearance of the brain. 6. Paranasal sinus disease, most notably severe bilateral sphenoid sinusitis. 7. Small bilateral mastoid effusions. Electronically Signed   By: Kellie Simmering DO   On: 02/25/2020 17:45   DG Chest Port 1 View  Result Date: 02/20/2020 CLINICAL DATA:  40 year old female COVID-58. EXAM: PORTABLE CHEST 1 VIEW COMPARISON:  Portable chest 02/19/2020 and earlier. FINDINGS: Portable AP semi upright view at 0448 hours. Stable tracheostomy tube, left IJ approach central line and visible enteric tube. Larger lung volumes and improved ventilation. Largely regressed left upper lung opacity since yesterday. Residual confluent bibasilar opacity, including suspected left lower lobe collapse or consolidation. No pneumothorax. No definite pleural effusion. No acute osseous abnormality  identified. Paucity of bowel gas in the upper abdomen. IMPRESSION: 1. Improved bilateral ventilation with residual confluent bibasilar opacity. 2. No new cardiopulmonary abnormality identified. 3.  Stable lines and tubes. Electronically Signed   By: Genevie Ann M.D.   On: 02/20/2020 07:03   DG Chest Port 1 View  Result Date: 02/19/2020 CLINICAL DATA:  Post tracheostomy, COVID positive EXAM: PORTABLE CHEST 1 VIEW COMPARISON:  Earlier same day FINDINGS: Tracheostomy device is present approximately 5 cm above the carina. Left IJ central line and enteric tube are again identified. Persistent bilateral pulmonary opacities similar to the prior study. Probable small pleural effusions. No pneumothorax. Similar cardiomediastinal contours. IMPRESSION: New tracheostomy device. Otherwise stable lines and tubes. Similar bilateral pulmonary opacities. Electronically Signed   By: Macy Mis M.D.   On: 02/19/2020 16:48   DG Chest Port 1 View  Result Date: 02/19/2020 CLINICAL DATA:  Hypoxia.  Respiratory failure.  COVID-19. EXAM: PORTABLE CHEST 1 VIEW COMPARISON:  02/17/2020. FINDINGS: Endotracheal tube, feeding tube, left IJ line in stable position. Cardiomegaly with pulmonary venous congestion. Bilateral interstitial infiltrates again noted. Bibasilar atelectasis. Small bilateral pleural effusions again noted. No pneumothorax. IMPRESSION: 1. Lines and tubes in stable position. 2. Cardiomegaly with pulmonary venous congestion. 3. Bilateral interstitial infiltrates again noted. Bibasilar atelectasis. Small bilateral pleural effusions again noted. Electronically Signed   By: Marcello Moores  Register   On: 02/19/2020 05:19   DG CHEST PORT 1 VIEW  Result Date: 02/17/2020 CLINICAL DATA:  Intubation EXAM: PORTABLE CHEST 1 VIEW COMPARISON:  02/16/2020 FINDINGS: No significant interval change in AP portable chest radiograph, with diffuse bilateral interstitial heterogeneous airspace opacity, most conspicuous at the lung bases. Mild  cardiomegaly.  Support apparatus is unchanged including endotracheal tube, esophagogastric tube, and left neck vascular catheter. IMPRESSION: 1. No significant interval change in AP portable chest radiograph, with diffuse bilateral interstitial heterogeneous airspace opacity, most conspicuous at the lung bases. Findings are consistent with multifocal infection, edema, and/or ARDS. 2.  Unchanged support apparatus. Electronically Signed   By: Eddie Candle M.D.   On: 02/17/2020 11:26   DG CHEST PORT 1 VIEW  Result Date: 02/16/2020 CLINICAL DATA:  Altered mental status EXAM: PORTABLE CHEST 1 VIEW COMPARISON:  Yesterday FINDINGS: Endotracheal tube with tip halfway between the clavicular heads and carina. The feeding tube at least reaches the diaphragm. Left IJ line with tip at the brachiocephalic SVC confluence. Extensive bilateral pneumonia. Cardiomegaly. No visible air leak. IMPRESSION: Stable hardware positioning and extensive pneumonia. Electronically Signed   By: Monte Fantasia M.D.   On: 02/16/2020 07:08   DG CHEST PORT 1 VIEW  Result Date: 02/15/2020 CLINICAL DATA:  COVID positive.  Ventilator dependence. EXAM: PORTABLE CHEST 1 VIEW COMPARISON:  02/14/2020 FINDINGS: 0531 hours. Endotracheal tube tip is 2.7 cm above the base of the carina. A feeding tube passes into the stomach although the distal tip position is not included on the film. Left IJ central line tip overlies the innominate vein confluence. Stable asymmetric elevation right hemidiaphragm. Relatively diffuse bilateral airspace disease again noted with mid and lower lung predominance. The cardio pericardial silhouette is enlarged. Telemetry leads overlie the chest. IMPRESSION: No substantial interval change in exam. Electronically Signed   By: Misty Stanley M.D.   On: 02/15/2020 09:02   DG CHEST PORT 1 VIEW  Result Date: 02/14/2020 CLINICAL DATA:  ETT EXAM: PORTABLE CHEST 1 VIEW COMPARISON:  Radiograph 02/13/2020 FINDINGS: *Endotracheal  tube tip low in the trachea, 2 cm from the carina. Consider retraction 1-2 cm to the mid trachea. *Transesophageal tube tip terminates near the region of the gastric antrum/duodenal bulb. *Left upper IJ approach central venous catheter tip terminates near the left brachiocephalic-caval confluence. Persistent diffuse heterogeneous opacities throughout both lungs in a mid to lower lung predominance, not significantly changed from 1 day prior. Cardiomegaly is stable. Indistinct vascularity. No pneumothorax. Obscuration of the hemidiaphragms may suggest some layering effusion. No acute osseous or soft tissue abnormality. Telemetry leads overlie the chest. IMPRESSION: 1. Endotracheal tube tip low in the trachea, 2 cm from the carina. Consider retraction 1-2 cm to the mid trachea. 2. Stable diffuse heterogeneous opacities throughout both lungs in a mid to lower lung predominance. Could reflect a combination of edema and infection with likely layering bilateral effusions. These results will be called to the ordering clinician or representative by the Radiologist Assistant, and communication documented in the PACS or [employer]. Electronically Signed   By: Lovena Le M.D.   On: 02/14/2020 05:39   DG CHEST PORT 1 VIEW  Result Date: 02/13/2020 CLINICAL DATA:  Hypoxia EXAM: PORTABLE CHEST 1 VIEW COMPARISON:  February 12, 2020 FINDINGS: Endotracheal tube tip is 1.7 cm above the carina. Enteric tube tip is below the diaphragm. Central catheter tip is in the left innominate vein near the junction with the superior vena cava. No pneumothorax. There is airspace opacity in both mid and lower lung regions with small pleural effusions bilaterally. Heart is enlarged with pulmonary vascularity normal, stable. No adenopathy. No bone lesions. IMPRESSION: Tube and catheter positions as described without pneumothorax. Persistent airspace opacity in the mid and lower lung regions with bilateral pleural effusions. Cardiomegaly.  Overall appearance raises concern for a  degree of congestive heart failure. There may well be superimposed pneumonia in the lung bases. Both edema and pneumonia may present concurrently. Electronically Signed   By: Lowella Grip III M.D.   On: 02/13/2020 08:03   DG CHEST PORT 1 VIEW  Result Date: 02/12/2020 CLINICAL DATA:  Endotracheal tube placement. EXAM: PORTABLE CHEST 1 VIEW COMPARISON:  02/11/2020. FINDINGS: Endotracheal tube and feeding tube in stable position. Left IJ line stable position. Cardiomegaly. Low lung volumes with bibasilar atelectasis. Diffuse bilateral pulmonary infiltrates/edema again noted. Small bilateral pleural effusions again noted. Chest is unchanged from prior exam. No pneumothorax. IMPRESSION: 1. Lines and tubes in stable position. 2. Cardiomegaly with persistent bilateral pulmonary infiltrates/edema and small bilateral pleural effusions. 3. Low lung volumes with bibasilar atelectasis. Chest is unchanged from prior exam. Electronically Signed   By: Marcello Moores  Register   On: 02/12/2020 05:21   DG CHEST PORT 1 VIEW  Result Date: 02/11/2020 CLINICAL DATA:  Hypoxia EXAM: PORTABLE CHEST 1 VIEW COMPARISON:  February 08, 2020 FINDINGS: Endotracheal tube tip is 3.0 cm above the carina. Enteric tube tip is below the diaphragm. Central catheter tip is in the superior vena cava slightly beyond the junction with the left innominate vein. No pneumothorax. There is cardiomegaly with pulmonary venous hypertension. There is multifocal airspace opacity throughout the lungs bilaterally with equivocal pleural effusions bilaterally. No bone lesions. No adenopathy appreciable. IMPRESSION: Tube and catheter positions as described without pneumothorax. Cardiomegaly with pulmonary vascular congestion. Small pleural effusions. Multifocal airspace opacity may represent pulmonary edema or multifocal pneumonia. Both entities may be present concurrently. Appearance overall similar to recent study.  Electronically Signed   By: Lowella Grip III M.D.   On: 02/11/2020 08:51   DG Chest Port 1 View  Result Date: 02/07/2020 CLINICAL DATA:  Acute respiratory failure. Pneumonia due to COVID-19. EXAM: PORTABLE CHEST 1 VIEW COMPARISON:  Radiograph yesterday. FINDINGS: Progressive heterogeneous bilateral airspace opacities, confluent in the lower lobes. Heart size grossly normal but obscured by adjacent airspace disease. Overall low lung volumes. No evidence of pneumomediastinum or pneumothorax. Soft tissue attenuation from habitus limits assessment. IMPRESSION: Bilateral COVID pneumonia which is progressed from yesterday. Low lung volumes persist. Electronically Signed   By: Keith Rake M.D.   On: 02/07/2020 15:36   DG Chest Port 1 View  Result Date: 02/06/2020 CLINICAL DATA:  Sepsis.  Encephalopathy. EXAM: PORTABLE CHEST 1 VIEW COMPARISON:  None. FINDINGS: Multifocal airspace opacity throughout both lungs. Small pleural effusions. No pneumothorax. IMPRESSION: Multifocal bilateral airspace opacities concerning for multifocal infection. Electronically Signed   By: Ulyses Jarred M.D.   On: 02/06/2020 19:36   DG Abd Portable 1V  Result Date: 02/29/2020 CLINICAL DATA:  Feeding tube placement EXAM: PORTABLE ABDOMEN - 1 VIEW COMPARISON:  1 day prior FINDINGS: Feeding tube terminates at the distal stomach or entering the duodenal bulb. Contrast within normal caliber colon. Right hemidiaphragm elevation. Bibasilar airspace disease. IMPRESSION: Feeding tube at the distal stomach or duodenal bulb. Electronically Signed   By: Abigail Miyamoto M.D.   On: 02/29/2020 12:03   DG Abd Portable 1V  Result Date: 02/28/2020 CLINICAL DATA:  OG tube placement. EXAM: PORTABLE ABDOMEN - 1 VIEW COMPARISON:  None. FINDINGS: Small bore feeding tube is noted with tip overlying the duodenum versus distal stomach. No dilated bowel loops are noted within the mid-UPPER abdomen. Contrast is noted within the colon, from swallowing  function study performed yesterday. IMPRESSION: Small bore feeding tube with tip overlying the duodenum/distal stomach. No dilated bowel  loops identified on this exam. Electronically Signed   By: Margarette Canada M.D.   On: 02/28/2020 17:55   DG Swallowing Func-Speech Pathology  Result Date: 02/27/2020 Objective Swallowing Evaluation: Type of Study: MBS-Modified Barium Swallow Study  Patient Details Name: ALEIAH MOHAMMED MRN: [id number] Date of Birth: Nov 01, 1980 Today's Date: 02/27/2020 Time: SLP Start Time (ACUTE ONLY): 1358 -SLP Stop Time (ACUTE ONLY): 1420 SLP Time Calculation (min) (ACUTE ONLY): 22 min Past Medical History: No past medical history on file. Past Surgical History: No past surgical history on file. HPI: Pt is a 40 year old female with medical history significant for obesity.  She presented to [facility] ED on 12/15 with 24 to 48 hours of AMS following exposure to her COVID+ son. CXR showed diffuse bilateral airspace disease, she was confused and agitated. Physical restraints applied on admission and Precedex infusion given.  She required 100% nonrebreather mask and was transferred to [facility] for further intervention. CXR 12/29: Improved bilateral ventilation with residual confluent bibasilar opacity. CT head negative. ETT 12/17-trach 12/28. Stat CT head completed on 1/3 due to increased weakness noted by PT and was negative.  No data recorded Assessment / Plan / Recommendation CHL IP CLINICAL IMPRESSIONS 02/27/2020 Clinical Impression Pt demonstrates a moderate oropharyngeal dysphagia likely secondry to prolonged intubation with decreased glottic closure. Pt has adequate oral phase (though no upper dentition) and timely swallow, though there is silent aspiration during the swallow with thin and nectar thick liquids regardless of bolus size and despite a chin tuck. Pt was not wearing her PMSV during this exam (pt COVID positive and PMSV left in room) and Cortrak was also in place. Suspect that  pt may be able to quickly upgrade to nectar when cortrak comes out and pt has PMSV in place to aid in cough/throat clear response. For now pt able to initaite a dys 2/honey thick diet. WIll f/u for tolerance. SLP Visit Diagnosis Dysphagia, unspecified (R13.10) Attention and concentration deficit following -- Frontal lobe and executive function deficit following -- Impact on safety and function Moderate aspiration risk   CHL IP TREATMENT RECOMMENDATION 02/27/2020 Treatment Recommendations Therapy as outlined in treatment plan below   Prognosis 02/27/2020 Prognosis for Safe Diet Advancement Good Barriers to Reach Goals -- Barriers/Prognosis Comment -- CHL IP DIET RECOMMENDATION 02/27/2020 SLP Diet Recommendations Dysphagia 2 (Fine chop) solids;Honey thick liquids Liquid Administration via Cup;Straw Medication Administration Whole meds with puree Compensations Slow rate;Small sips/bites Postural Changes Seated upright at 90 degrees   CHL IP OTHER RECOMMENDATIONS 02/27/2020 Recommended Consults -- Oral Care Recommendations -- Other Recommendations Place PMSV during PO intake;Have oral suction available   CHL IP FOLLOW UP RECOMMENDATIONS 02/27/2020 Follow up Recommendations Inpatient Rehab   CHL IP FREQUENCY AND DURATION 02/27/2020 Speech Therapy Frequency (ACUTE ONLY) min 2x/week Treatment Duration 2 weeks      CHL IP ORAL PHASE 02/27/2020 Oral Phase Impaired Oral - Pudding Teaspoon -- Oral - Pudding Cup -- Oral - Honey Teaspoon WFL Oral - Honey Cup WFL Oral - Nectar Teaspoon WFL Oral - Nectar Cup WFL Oral - Nectar Straw WFL Oral - Thin Teaspoon -- Oral - Thin Cup WFL Oral - Thin Straw WFL Oral - Puree WFL Oral - Mech Soft -- Oral - Regular -- Oral - Multi-Consistency -- Oral - Pill -- Oral Phase - Comment --  CHL IP PHARYNGEAL PHASE 02/27/2020 Pharyngeal Phase Impaired Pharyngeal- Pudding Teaspoon -- Pharyngeal -- Pharyngeal- Pudding Cup -- Pharyngeal -- Pharyngeal- Honey Teaspoon -- Pharyngeal -- Pharyngeal- Honey  Cup [facility] Pharyngeal  -- Pharyngeal- Nectar Teaspoon -- Pharyngeal -- Pharyngeal- Nectar Cup Penetration/Aspiration during swallow;Reduced airway/laryngeal closure Pharyngeal Material enters airway, passes BELOW cords without attempt by patient to eject out (silent aspiration) Pharyngeal- Nectar Straw Penetration/Aspiration during swallow;Reduced airway/laryngeal closure Pharyngeal Material enters airway, passes BELOW cords without attempt by patient to eject out (silent aspiration) Pharyngeal- Thin Teaspoon -- Pharyngeal -- Pharyngeal- Thin Cup Penetration/Aspiration during swallow Pharyngeal Material enters airway, passes BELOW cords without attempt by patient to eject out (silent aspiration) Pharyngeal- Thin Straw Penetration/Aspiration during swallow Pharyngeal Material enters airway, passes BELOW cords without attempt by patient to eject out (silent aspiration) Pharyngeal- Puree WFL Pharyngeal -- Pharyngeal- Mechanical Soft WFL Pharyngeal -- Pharyngeal- Regular -- Pharyngeal -- Pharyngeal- Multi-consistency -- Pharyngeal -- Pharyngeal- Pill -- Pharyngeal -- Pharyngeal Comment --  No flowsheet data found. DeBlois, Katherene Ponto 02/27/2020, 2:47 PM              EEG adult  Result Date: 02/17/2020 Lora Havens, MD     02/17/2020  3:05 PM Patient Name: SYLVA OVERLEY MRN: [id number] Epilepsy Attending: Lora Havens Referring Physician/Provider: Dr Brand Males Date: 02/17/2020 Duration: 24.30 mins Patient history: 40yo F with ams in setting of sepsis and Covid. EEG to evaluate for seizure Level of alertness:  comatose AEDs during EEG study: Versed Technical aspects: This EEG study was done with scalp electrodes positioned according to the 10-20 International system of electrode placement. Electrical activity was acquired at a sampling rate of _0  and reviewed with a high frequency filter of _1  and a low frequency filter of _2 . EEG data were recorded continuously and digitally stored. Description: EEG showed  continuous generalized 3 to 6 Hz theta-delta slowing admixed with an excessive amount of 15 to 18 Hz beta activity distributed symmetrically and diffusely.  Hyperventilation and photic stimulation were not performed.   ABNORMALITY -Excessive beta, generalized -Continuous slow, generalized IMPRESSION: This study is suggestive of severe diffuse encephalopathy, nonspecific etiology but likely related to sedation. No seizures or epileptiform discharges were seen throughout the recording. Lora Havens   ECHOCARDIOGRAM COMPLETE  Result Date: 02/12/2020    ECHOCARDIOGRAM REPORT   Patient Name:   TERESA NICODEMUS Date of Exam: 02/12/2020 Medical Rec #:  [id number]        Height:       63.0 in Accession #:    [id number]       Weight:       240.5 lb Date of Birth:  1980-11-16        BSA:          2.091 m Patient Age:    46 years         BP:           99/70 mmHg Patient Gender: F                HR:           54 bpm. Exam Location:  Inpatient Procedure: 2D Echo, Cardiac Doppler and Color Doppler Indications:    Acute respiratory distress R06.03  History:        Patient has no prior history of Echocardiogram examinations.  Sonographer:    Clayton Lefort RDCS (AE) Referring Phys: [id number]  Sonographer Comments: Image acquisition challenging due to patient body habitus. IMPRESSIONS  1. Left ventricular ejection fraction, by estimation, is 55 to 60%. The left ventricle has normal function. The left ventricle has no regional wall motion abnormalities. Left ventricular  diastolic parameters were normal.  2. Right ventricular systolic function is normal. The right ventricular size is normal. The estimated right ventricular systolic pressure is 16.1 mmHg.  3. The mitral valve is grossly normal. Trivial mitral valve regurgitation.  4. The aortic valve is tricuspid. Aortic valve regurgitation is not visualized.  5. The inferior vena cava is normal in size with greater than 50% respiratory variability, suggesting right  atrial pressure of 3 mmHg. Conclusion(s)/Recommendation(s): Normal biventricular function without evidence of hemodynamically significant valvular heart disease. FINDINGS  Left Ventricle: Left ventricular ejection fraction, by estimation, is 55 to 60%. The left ventricle has normal function. The left ventricle has no regional wall motion abnormalities. The left ventricular internal cavity size was normal in size. There is  no left ventricular hypertrophy. Left ventricular diastolic parameters were normal. Right Ventricle: The right ventricular size is normal. No increase in right ventricular wall thickness. Right ventricular systolic function is normal. The tricuspid regurgitant velocity is 2.26 m/s, and with an assumed right atrial pressure of 3 mmHg, the estimated right ventricular systolic pressure is 09.6 mmHg. Left Atrium: Left atrial size was normal in size. Right Atrium: Right atrial size was normal in size. Pericardium: There is no evidence of pericardial effusion. Mitral Valve: The mitral valve is grossly normal. Trivial mitral valve regurgitation. Tricuspid Valve: The tricuspid valve is grossly normal. Tricuspid valve regurgitation is trivial. Aortic Valve: The aortic valve is tricuspid. Aortic valve regurgitation is not visualized. Aortic valve mean gradient measures 3.0 mmHg. Aortic valve peak gradient measures 5.8 mmHg. Aortic valve area, by VTI measures 2.60 cm. Pulmonic Valve: The pulmonic valve was grossly normal. Pulmonic valve regurgitation is trivial. Aorta: The aortic root and ascending aorta are structurally normal, with no evidence of dilitation. Venous: The inferior vena cava is normal in size with greater than 50% respiratory variability, suggesting right atrial pressure of 3 mmHg. IAS/Shunts: No atrial level shunt detected by color flow Doppler.  LEFT VENTRICLE PLAX 2D LVIDd:         5.20 cm  Diastology LVIDs:         3.80 cm  LV e' medial:    9.90 cm/s LV PW:         1.30 cm  LV E/e'  medial:  7.5 LV IVS:        1.00 cm  LV e' lateral:   10.10 cm/s LVOT diam:     2.20 cm  LV E/e' lateral: 7.4 LV SV:         67 LV SV Index:   32 LVOT Area:     3.80 cm  RIGHT VENTRICLE             IVC RV Basal diam:  2.60 cm     IVC diam: 1.10 cm RV S prime:     11.10 cm/s TAPSE (M-mode): 1.9 cm LEFT ATRIUM             Index       RIGHT ATRIUM           Index LA diam:        2.90 cm 1.39 cm/m  RA Area:     14.60 cm LA Vol (A2C):   49.8 ml 23.81 ml/m RA Volume:   35.90 ml  17.17 ml/m LA Vol (A4C):   37.8 ml 18.07 ml/m LA Biplane Vol: 46.4 ml 22.19 ml/m  AORTIC VALVE AV Area (Vmax):    2.65 cm AV Area (Vmean):   2.80 cm AV Area (  VTI):     2.60 cm AV Vmax:           120.00 cm/s AV Vmean:          76.100 cm/s AV VTI:            0.259 m AV Peak Grad:      5.8 mmHg AV Mean Grad:      3.0 mmHg LVOT Vmax:         83.60 cm/s LVOT Vmean:        56.100 cm/s LVOT VTI:          0.177 m LVOT/AV VTI ratio: 0.68  AORTA Ao Root diam: 3.30 cm Ao Asc diam:  2.60 cm MITRAL VALVE               TRICUSPID VALVE MV Area (PHT): 3.21 cm    TR Peak grad:   20.4 mmHg MV Decel Time: 236 msec    TR Vmax:        226.00 cm/s MV E velocity: 74.60 cm/s MV A velocity: 40.70 cm/s  SHUNTS MV E/A ratio:  1.83        Systemic VTI:  0.18 m                            Systemic Diam: 2.20 cm Lyman Bishop MD Electronically signed by Lyman Bishop MD Signature Date/Time: 02/12/2020/6:17:29 PM    Final    CT HEAD CODE STROKE WO CONTRAST`  Result Date: 02/25/2020 CLINICAL DATA:  Code stroke.  Left-sided weakness EXAM: CT HEAD WITHOUT CONTRAST TECHNIQUE: Contiguous axial images were obtained from the base of the skull through the vertex without intravenous contrast. COMPARISON:  None. FINDINGS: Brain: There is no acute intracranial hemorrhage, mass effect, or edema. Gray-white differentiation is preserved. No extra-axial collection. Ventricles and sulci are normal in size and configuration. Vascular: No hyperdense vessel. Skull: Unremarkable.  Sinuses/Orbits: Nonspecific paranasal sinus opacification primarily involving right maxillary and both sphenoid sinuses. There is thinning or dehiscence of the inferior right maxillary sinus wall. Orbits are unremarkable. Other: Minimal mastoid opacification. ASPECTS (Matthews Stroke Program Early CT Score) - Ganglionic level infarction (caudate, lentiform nuclei, internal capsule, insula, M1-M3 cortex): 7 - Supraganglionic infarction (M4-M6 cortex): 3 Total score (0-10 with 10 being normal): 10 IMPRESSION: There is no acute intracranial hemorrhage or evidence of acute infarction. ASPECT score is 10. These results were communicated to Dr. Lorrin Goodell at 12:15 pm on 02/25/2020 by text page via the [facility] messaging system. Electronically Signed   By: Macy Mis M.D.   On: 02/25/2020 12:36   VAS [place] LOWER EXTREMITY VENOUS (DVT)  Result Date: 02/18/2020  Lower Venous DVT Study Indications: Covid-19, elevated D-Dimer.  Comparison Study: No prior study Performing Technologist: Sharion Dove RVS  Examination Guidelines: A complete evaluation includes B-mode imaging, spectral Doppler, color Doppler, and power Doppler as needed of all accessible portions of each vessel. Bilateral testing is considered an integral part of a complete examination. Limited examinations for reoccurring indications may be performed as noted. The reflux portion of the exam is performed with the patient in reverse Trendelenburg.  +---------+---------------+---------+-----------+----------+--------------+  RIGHT     Compressibility Phasicity Spontaneity Properties Thrombus Aging  +---------+---------------+---------+-----------+----------+--------------+  CFV       Full            Yes       Yes                                    +---------+---------------+---------+-----------+----------+--------------+  SFJ       Full                                                              +---------+---------------+---------+-----------+----------+--------------+  FV Prox   Full                                                             +---------+---------------+---------+-----------+----------+--------------+  FV Mid    Full                                                             +---------+---------------+---------+-----------+----------+--------------+  FV Distal Full                                                             +---------+---------------+---------+-----------+----------+--------------+  PFV       Full                                                             +---------+---------------+---------+-----------+----------+--------------+  POP       Full            Yes       Yes                                    +---------+---------------+---------+-----------+----------+--------------+  PTV       Full                                                             +---------+---------------+---------+-----------+----------+--------------+  PERO      Full                                                             +---------+---------------+---------+-----------+----------+--------------+   +---------+---------------+---------+-----------+----------+--------------+  LEFT      Compressibility Phasicity Spontaneity Properties Thrombus Aging  +---------+---------------+---------+-----------+----------+--------------+  CFV       Full            Yes       Yes                                    +---------+---------------+---------+-----------+----------+--------------+  SFJ       Full                                                             +---------+---------------+---------+-----------+----------+--------------+  FV Prox   Full                                                             +---------+---------------+---------+-----------+----------+--------------+  FV Mid    Full                                                              +---------+---------------+---------+-----------+----------+--------------+  FV Distal Full                                                             +---------+---------------+---------+-----------+----------+--------------+  PFV       Full                                                             +---------+---------------+---------+-----------+----------+--------------+  POP       Full            Yes       Yes                                    +---------+---------------+---------+-----------+----------+--------------+  PTV       Full                                                             +---------+---------------+---------+-----------+----------+--------------+  PERO      Full                                                             +---------+---------------+---------+-----------+----------+--------------+     Summary: BILATERAL: - No evidence of deep vein thrombosis seen in the lower extremities, bilaterally. -No evidence of popliteal cyst, bilaterally.   *See table(s) above for measurements and observations. Electronically signed by Ruta Hinds MD on 02/18/2020 at 5:24:02 PM.    Final

## 2020-03-04 NOTE — H&P (Signed)
Physical Medicine and Rehabilitation Admission H&P    Chief Complaint  Patient presents with  . Debility due to Covid 19 ARDS and withdrawal    HPI: Belinda [name]. Friese is a 40 year old female with history of anxiety d/o, morbid obesity, GERD who was admitted on 01/27/20 via [facility] with hypoxia, confusion and delirium wht hypotension due to Covid 19 PNA.  History taken from chart review and patient due to cognition.  Patient was sedated with precedex due to agitation, intubated and treated with Covid protocol-Remdesiver, barcitinib, Solumedrol and antibiotics. Hospital course significant for issues with agitation and combativeness (required fentanyl, Precedex, ketamine, versed)  as well as worsening of respiratory status due to ARDS requiring intubation. CT head unremarkable for acute intracranial process on 12/15 and again on 12/27.  UDS positive for benzo's and opiates. She was started on methadone on 02/16/2020 due to concerns of drug withdrawal and on 12/26 family (confirmed with patient) reported that patient had been taking her fianc's Suboxone for the past year and ( hx of accidental ibuprofen/ETOH OD in 2007--question "another substance" per mother)  Hospital course further complicated by acute lower UTI.  Klebsiella UTI with sepsis treated with Unasyn.  Neurology consulted for input on refractory delirium and encephalopathy, which was felt to be multifactorial. EEG done negative for seizures and showed severe diffuse encephalopathy.  She developed ARDS with CAP v/s aspiration and underwent tracheostomy on 02/19/2020 by Dr. Tamala Julian.  She was being weaned to ATC and on 02/25/2020 found to have disconjugate gaze with question of Left> right sided weakness.  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  QTc being monitored while on methadone with as needed Haldol.  Mentation improving and scheduled hydromorphone and methadone being weaned off. Risperdal DC'd due to concerns of tremors/jerks. Xanax resumed per home regimen but she was reporting  diarrhea with attempts to wean dilaudid and increased to 1 mg tid and methadone increased to 10 mg bid on 03/04/2020 fluid overload treated with intermittent IV diuresis and now on oral lasix. Tolerating dysphagia 2 with honey liquids but continues on nocturnal tube feeds due to poor intake. PCCM following for input and recommended downsize to cuffless #6 but patient wanted to wait until today due to anxiety. Therapy has been ongoing and patient limited by decreased activity tolerance, hypoxia with activity,  weakness with flexed posture, narrow base of support with unsteadiness and LE fatigue with ambulation. CIR recommended due to functional deficits in mobility and ADLs.  Please see preadmission assessment from earlier today as well.  Review of Systems  Constitutional: Positive for malaise/fatigue. Negative for chills and fever.  HENT: Positive for sinus pain. Negative for hearing loss.   Eyes: Positive for double vision (intermittently).  Respiratory: Positive for cough and shortness of breath (with anxiety/activity).   Cardiovascular: Negative for chest pain and palpitations.  Gastrointestinal: Positive for heartburn and nausea.  Genitourinary: Negative for dysuria and urgency.  Musculoskeletal: Positive for myalgias.  Skin: Negative for itching and rash.  Neurological: Positive for tremors, speech change and weakness. Negative for dizziness, sensory change and headaches.  Psychiatric/Behavioral: Positive for depression. The patient is nervous/anxious.   All other systems reviewed and are negative.   Past Medical History:  Diagnosis Date  . Anxiety disorder   . Fatty liver   .  GAD (generalized anxiety  disorder)    with panic attacks  . GERD (gastroesophageal reflux disease)   . Morbid obesity ([place])   . Narcotic drug use   . OCD (obsessive compulsive disorder)   . Psoriasis      Past Surgical History:  Procedure Laterality Date  . DILATION AND CURETTAGE OF UTERUS    . ESOPHAGOGASTRODUODENOSCOPY  07/2018     Family History  Problem Relation Age of Onset  . High blood pressure Father   . Stroke Father   . High blood pressure Brother      Social History: Lives with fiance and 39 year old. 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 She used to smoke 1/2 PPD and does not use any somokless tobacco. She denies any alcohol use. She denies any IV or other illicit drug abuse.     Allergies  Allergen Reactions  . Buspar [Buspirone]     Mania/insomnia  . Celexa [Citalopram]     mania  . Doxycycline Diarrhea    diarrhea  . Wellbutrin [Bupropion]     insomnia  . Zoloft [Sertraline]     mania     Medications Prior to Admission  Medication Sig Dispense Refill  . albuterol (VENTOLIN HFA) 108 (90 Base) MCG/ACT inhaler Inhale 2 puffs into the lungs every 6 (six) hours as needed for wheezing or shortness of breath.    . ALPRAZolam (XANAX) 1 MG tablet Take 1 mg by mouth 4 (four) times daily as needed for anxiety.    . cetirizine (ZYRTEC) 10 MG tablet Take 10 mg by mouth daily.    . clobetasol (TEMOVATE) 0.05 % external solution Apply 1 application topically 2 (two) times daily as needed (skin irritation).    Marland Kitchen dexlansoprazole (DEXILANT) 60 MG capsule Take 60 mg by mouth daily.    . famotidine (PEPCID) 40 MG tablet Take 40 mg by mouth daily.    . fluticasone (FLONASE) 50 MCG/ACT nasal spray Place 2 sprays into both nostrils daily.    . fluticasone (FLOVENT HFA) 110 MCG/ACT inhaler Inhale 1 puff into the lungs 2 (two) times daily.    Marland Kitchen linaclotide (LINZESS) 290 MCG CAPS capsule Take 290 mcg by mouth daily before breakfast.    . cefdinir (OMNICEF) 300 MG capsule Take 300 mg by mouth 2  (two) times daily. For 10 days (Patient not taking: Reported on 02/08/2020)      Drug Regimen Review  Drug regimen was reviewed and remains appropriate with no significant issues identified  Home: Home Living Family/patient expects to be discharged to:: Private residence Living Arrangements: Children Available Help at Discharge: Family,Available 24 hours/day Type of Home: Apartment Home Access: Stairs to enter Entrance Stairs-Rails: None Home Layout: One level Bathroom Shower/Tub: Multimedia programmer: Handicapped height Bathroom Accessibility: Yes Additional Comments: pt unable to provide home setup or PLOF and no family present  Lives With: Other (Comment)   Functional History: Prior Function Level of Independence: Independent  Functional Status:  Mobility: Bed Mobility Overal bed mobility: Needs Assistance Bed Mobility: Supine to Sit Rolling: Min assist,+2 for safety/equipment Supine to sit: HOB elevated,Min assist Sit to supine: Supervision General bed mobility comments: Pt OOB in recliner at beginning and end of session Transfers Overall transfer level: Needs assistance Equipment used: Rolling walker (2 wheeled) Transfers: Sit to/from Stand Sit to Stand: Min assist Stand pivot transfers: Mod assist,+2 physical assistance General transfer comment: min +2 assist to stand from recliner initially, minguard for additional standing with  repeated cues for hand placement Ambulation/Gait Ambulation/Gait assistance: Min assist,+2 safety/equipment (close chair follow) Gait Distance (Feet): 10 Feet (1x8, 1x10) Assistive device: Rolling walker (2 wheeled) Gait Pattern/deviations: Step-to pattern,Trunk flexed,Narrow base of support General Gait Details: min A for steadying, vc for upright posture and proximity to RW, pt with increased LE fatigue with distance Gait velocity: slowed Gait velocity interpretation: <1.31 ft/sec, indicative of household ambulator     ADL: ADL Overall ADL's : Needs assistance/impaired Eating/Feeding: Supervision/ safety,Set up,Sitting (modifeid diet with PMSV) Grooming: Set up,Sitting Grooming Details (indicate cue type and reason): in reclienr Upper Body Bathing: Minimal assistance,Sitting Lower Body Bathing: Moderate assistance,Sit to/from stand Upper Body Dressing : Minimal assistance,Sitting Lower Body Dressing: Moderate assistance,Sit to/from stand Lower Body Dressing Details (indicate cue type and reason): able to donn socks using figure four position in bed Toilet Transfer: Minimal assistance,Stand-pivot Toilet Transfer Details (indicate cue type and reason): has been using BSC with RN in addition to therapy! Toileting- Clothing Manipulation and Hygiene: Moderate assistance,Sit to/from stand Functional mobility during ADLs: Minimal assistance,Rolling walker,Cueing for safety General ADL Comments: increased time spent helping pt comb through her matted hair  Cognition: Cognition Overall Cognitive Status: Impaired/Different from baseline Orientation Level: Oriented X4 Cognition Arousal/Alertness: Awake/alert Behavior During Therapy: WFL for tasks assessed/performed,Anxious Overall Cognitive Status: Impaired/Different from baseline Area of Impairment: Safety/judgement,Awareness Orientation Level: Disoriented to,Time Current Attention Level: Sustained Memory: Decreased recall of precautions,Decreased short-term memory Following Commands: Follows one step commands consistently Safety/Judgement: Decreased awareness of safety,Decreased awareness of deficits Awareness: Emergent Problem Solving: Slow processing General Comments: cognition continues to improve. good awareness of anxiety, relaxation methods coached  Physical Exam: Blood pressure 119/77, pulse (!) 114, temperature 98.8 F (37.1 C), temperature source Oral, resp. rate (!) 27, height 5' 3"  (1.6 m), weight 96.4 kg, last menstrual period  02/29/2020, SpO2 97 %. Physical Exam Vitals reviewed.  Constitutional:      Appearance: She is obese.     Comments: Obese female. Lying in bed with PMSV in place.   HENT:     Head: Normocephalic and atraumatic.     Nose: Nose normal.  Eyes:     General:        Right eye: No discharge.        Left eye: No discharge.     Extraocular Movements: Extraocular movements intact.  Neck:     Comments: + Trach with PMV and trach collar Cardiovascular:     Rate and Rhythm: Regular rhythm. Tachycardia present.  Pulmonary:     Effort: Pulmonary effort is normal. No respiratory distress.     Breath sounds: No stridor.  Abdominal:     General: Abdomen is flat. Bowel sounds are normal. There is no distension.  Musculoskeletal:     Comments: No edema or tenderness in extremities  Skin:    General: Skin is warm and dry.  Neurological:     Mental Status: She is alert.     Comments: Alert and oriented, except for date of month Sensation intact to light touch Motor: 4 -/5 throughout   Psychiatric:        Mood and Affect: Mood is anxious.        Cognition and Memory: Cognition is impaired.     Results for orders placed or performed during the hospital encounter of 02/06/20 (from the past 48 hour(s))  Glucose, capillary     Status: Abnormal   Collection Time: 03/03/20 11:56 AM  Result Value Ref Range   Glucose-Capillary 105 (H)  70 - 99 mg/dL    Comment: Glucose reference range applies only to samples taken after fasting for at least 8 hours.  Glucose, capillary     Status: None   Collection Time: 03/03/20  4:16 PM  Result Value Ref Range   Glucose-Capillary 82 70 - 99 mg/dL    Comment: Glucose reference range applies only to samples taken after fasting for at least 8 hours.  Glucose, capillary     Status: Abnormal   Collection Time: 03/03/20  8:17 PM  Result Value Ref Range   Glucose-Capillary 105 (H) 70 - 99 mg/dL    Comment: Glucose reference range applies only to samples taken after  fasting for at least 8 hours.  CBC     Status: Abnormal   Collection Time: 03/04/20 12:13 AM  Result Value Ref Range   WBC 7.1 4.0 - 10.5 K/uL   RBC 3.31 (L) 3.87 - 5.11 MIL/uL   Hemoglobin 9.9 (L) 12.0 - 15.0 g/dL   HCT 31.0 (L) 36.0 - 46.0 %   MCV 93.7 80.0 - 100.0 fL   MCH 29.9 26.0 - 34.0 pg   MCHC 31.9 30.0 - 36.0 g/dL   RDW 13.7 11.5 - 15.5 %   Platelets 309 150 - 400 K/uL   nRBC 0.3 (H) 0.0 - 0.2 %    Comment: Performed at [facility] [address], [place], [place] [address]  Basic metabolic panel     Status: Abnormal   Collection Time: 03/04/20 12:13 AM  Result Value Ref Range   Sodium 135 135 - 145 mmol/L   Potassium 3.7 3.5 - 5.1 mmol/L   Chloride 98 98 - 111 mmol/L   CO2 25 22 - 32 mmol/L   Glucose, Bld 125 (H) 70 - 99 mg/dL    Comment: Glucose reference range applies only to samples taken after fasting for at least 8 hours.   BUN 16 6 - 20 mg/dL   Creatinine, Ser 0.61 0.44 - 1.00 mg/dL   Calcium 8.6 (L) 8.9 - 10.3 mg/dL   GFR, Estimated >60 >60 mL/min    Comment: (NOTE) Calculated using the CKD-EPI Creatinine Equation (2021)    Anion gap 12 5 - 15    Comment: Performed at [facility] [address], [place], [place] [address]  Glucose, capillary     Status: Abnormal   Collection Time: 03/04/20 12:30 AM  Result Value Ref Range   Glucose-Capillary 123 (H) 70 - 99 mg/dL    Comment: Glucose reference range applies only to samples taken after fasting for at least 8 hours.  Glucose, capillary     Status: Abnormal   Collection Time: 03/04/20  4:09 AM  Result Value Ref Range   Glucose-Capillary 131 (H) 70 - 99 mg/dL    Comment: Glucose reference range applies only to samples taken after fasting for at least 8 hours.  Glucose, capillary     Status: Abnormal   Collection Time: 03/04/20  8:04 AM  Result Value Ref Range   Glucose-Capillary 104 (H) 70 - 99 mg/dL    Comment: Glucose reference range applies only to samples taken after fasting for at least  8 hours.  Glucose, capillary     Status: Abnormal   Collection Time: 03/04/20 11:34 AM  Result Value Ref Range   Glucose-Capillary 118 (H) 70 - 99 mg/dL    Comment: Glucose reference range applies only to samples taken after fasting for at least 8 hours.   Comment  1 Notify RN   Glucose, capillary     Status: None   Collection Time: 03/04/20  4:24 PM  Result Value Ref Range   Glucose-Capillary 78 70 - 99 mg/dL    Comment: Glucose reference range applies only to samples taken after fasting for at least 8 hours.  Glucose, capillary     Status: Abnormal   Collection Time: 03/04/20  8:12 PM  Result Value Ref Range   Glucose-Capillary 103 (H) 70 - 99 mg/dL    Comment: Glucose reference range applies only to samples taken after fasting for at least 8 hours.  Glucose, capillary     Status: Abnormal   Collection Time: 03/05/20 12:03 AM  Result Value Ref Range   Glucose-Capillary 123 (H) 70 - 99 mg/dL    Comment: Glucose reference range applies only to samples taken after fasting for at least 8 hours.  Glucose, capillary     Status: Abnormal   Collection Time: 03/05/20  4:15 AM  Result Value Ref Range   Glucose-Capillary 101 (H) 70 - 99 mg/dL    Comment: Glucose reference range applies only to samples taken after fasting for at least 8 hours.  Glucose, capillary     Status: Abnormal   Collection Time: 03/05/20  9:42 AM  Result Value Ref Range   Glucose-Capillary 117 (H) 70 - 99 mg/dL    Comment: Glucose reference range applies only to samples taken after fasting for at least 8 hours.   No results found.     Medical Problem List and Plan: 1.  Decreased activity tolerance, hypoxia with activity, generalized weakness with flexed posture, narrow base of support with unsteadiness and LE fatigue with ambulation secondary to CIM.  -patient may not shower  -ELOS/Goals: 15-18 days/supervision  Admit to CIR 2.  Antithrombotics: -DVT/anticoagulation:  Pharmaceutical: Lovenox  -antiplatelet  therapy: NA 3. Substance abuse/Pain Management: On dilaudid and methadone-->is not ready to wean yet.   --would like to get back to Xanax 1 mg qid   Plan to wean 4. Mood: LCSW to follow for evaluation and support.   Will request neuropsych evaluation  -antipsychotic agents: N/A--Risperdal d/c  5. Neuropsych: This patient is not fully capable of making decisions on her own behalf. 6. Skin/Wound Care: Routine pressure relief measures.  7. Fluids/Electrolytes/Nutrition: Monitor I/O.   CMP ordered for tomorrow 8. Anxiety disorder: Question of Bipolar d/o-->patient denies and reports mania due to multiple psych medications. Does not want to try buspar or klonopin-->prefers to get back to "home regimen".    See #4 9. VDRF secondary to Covid 19 PNA/sepsis: Last course of antibiotics for MSSA VAP completed 01/02. Trach to be downsized to CFS #6.  10. GERD: Protonix bid not as effective and would like to get back on dexilant 11. Hyperglycemia due to tube feeds: Hgb A1C-5.8.  12. Dysphagia: Continue dysphagia #2, honey liquids. TF adjusted to HS yesterday. Will start calorie count to monitor intake. AKI resolved with water flushes--need to encourage thickened fluids intake.   Monitor with increased mobility  Bary Leriche, PA-C 03/05/2020  I have personally performed a face to face diagnostic evaluation, including, but not limited to relevant history and physical exam findings, of this patient and developed relevant assessment and plan.  Additionally, I have reviewed and concur with the physician assistant's documentation above.  Delice Lesch, MD, ABPMR

## 2020-03-04 NOTE — Plan of Care (Signed)
°  Problem: Education: Goal: Knowledge of General Education information will improve Description: Including pain rating scale, medication(s)/side effects and non-pharmacologic comfort measures Outcome: Progressing   Problem: Clinical Measurements: Goal: Ability to maintain clinical measurements within normal limits will improve Outcome: Progressing   Problem: Clinical Measurements: Goal: Diagnostic test results will improve Outcome: Progressing   Problem: Clinical Measurements: Goal: Respiratory complications will improve Outcome: Progressing   Problem: Activity: Goal: Risk for activity intolerance will decrease Outcome: Progressing   Problem: Nutrition: Goal: Adequate nutrition will be maintained Outcome: Progressing   Problem: Pain Managment: Goal: General experience of comfort will improve Outcome: Progressing   Problem: Education: Goal: Knowledge of risk factors and measures for prevention of condition will improve Outcome: Progressing   Problem: Respiratory: Goal: Will maintain a patent airway Outcome: Progressing   Problem: Respiratory: Goal: Complications related to the disease process, condition or treatment will be avoided or minimized Outcome: Progressing

## 2020-03-04 NOTE — Progress Notes (Addendum)
Inpatient Rehab Admissions Coordinator:   I met with this Pt. For ongoing discussion regarding possible CIR admit. She remains interested. I have insurance auth but do not have a bed available for pt. Today. Will follow up tomorrow for potential admit tomorrow    Clemens Catholic, [place], [place] Admissions Coordinator  [phone] (celll) [phone] (office)

## 2020-03-04 NOTE — Progress Notes (Signed)
NAME:  Belinda [name], MRN:  [id number], DOB:  1981/02/06, LOS: 25 ADMISSION DATE:  02/06/2020, CONSULTATION DATE:  12/16 REFERRING MD:  Dr Broadus John, CHIEF COMPLAINT:  Tracheostomy status   Brief History:  40 year old female s/p long ICU course due to COVID-19 ARDS. Now she is status post tracheostomy and has been liberated from the mechanical ventilator. PCCM has been consulted for ongoing trach management.    Past Medical History:   has a past medical history of Anxiety disorder, Bipolar disorder ([place]), Fatty liver, GAD (generalized anxiety disorder), GERD (gastroesophageal reflux disease), Morbid obesity ([place]), Narcotic drug use, OCD (obsessive compulsive disorder), and Psoriasis.   Significant Hospital Events:  12/15: presented to the emergency room, hypoxic, encephalopathic, Covid positive.C Started on supplemental oxygen, IV Solu-Medrol, IV remdesivir, and baricitinib.  Also started empirically on cefepime  and vancomycin to cover for potential bacterial pneumonia 12/16: multiple reports of worsening confusion, intermittent combativeness, attempts to bite staff.  Not able to take oral medications.  Started on Precedex.  Transferred to [facility]. Changed abx to unasyn, added low dose ativan as pt chronically on xanax.  12/17: Patient more hypoxic, requiring more sedation. Decision made to proceed with intubation.  Intubated, left IJ catheter placed.  PF ratio only 88.  Core track ordered.  Neuromuscular blockade initiated.  Prone protocol initiated. 12/19: No overnight events but this morning hypertensive and tachycardic. EKG personally reviewed, sinus tachycardia with normal axis. BIS values are low in the 30s. She is still on continuous paralytics.  12/20: neuromuscular blockade discontinued yesterday.  At this point in time patient is extremely agitated on the ventilator despite Dilaudid infusion Versed infusion, oxycodone schedule, clonopoin scheduloed,  and phenobarb once daily at night.  She  is on prednisone and Barcitinib. On vent 50%. AFebrikle 12/21: cxr horrible. 40% fio2, pulse ox 90%,.  On dilaudid gtt, oxycdone po, precedex gtt, versed gt and klonopin,  On phenobarb QHS x 2 night.  On haldol schedulesd x 1 dayt.   -. RASS -4 but easily goes to +2 per RN. On TF.  K 2.9 while on 46mkcl daily. + 4L volume overloa 12/22 - 40% fio2. On dilaudid gtt, [precedex gtt, versed gtt. On oxy and klonopin. Still very agitated On TF. -2L since admit 12/23 -> stil with significant intermittent agitation. On 30% fio2, Now on diprivan gtt, precedex gtt, fent gtt, versed gtt, levophed gtt. On vent, 30% fio2 12/24   - ? Low grade fever. 30% fio2 on vent. On fent gtt, prededex gtt, ketamine gtt, versed gtt . Also on levophed gtt. Some breakthrough agitation per RN but much better overall. Off diprivan gtt 12/26: On fent gtt, precedex gtt, ketamnie gtt, versed gtt. On TF. On free water.  Low grade fever + . On vent  - 30 12/28:Tracheostomy-paralytics off  Consults:    Procedures:  Intubation 12/17 > 12/28 Left IJ triple-lumen catheter 12/17 > Trach (DS) 12/28 >>   Significant Diagnostic Tests:    Micro Data:    Antimicrobials:     Interim History / Subjective:    Objective   Blood pressure 118/82, pulse (!) 112, temperature 98.3 F (36.8 C), temperature source Oral, resp. rate (!) 24, height 5' 3"  (1.6 m), weight 96.4 kg, last menstrual period 02/29/2020, SpO2 99 %.    FiO2 (%):  [23 %-28 %] 28 %   Intake/Output Summary (Last 24 hours) at 03/04/2020 1329 Last data filed at 03/04/2020 0600 Gross per 24 hour  Intake 1080 ml  Output --  Net 1080 ml   Filed Weights   03/02/20 0408 03/03/20 0358 03/04/20 0550  Weight: 96.8 kg 98.3 kg 96.4 kg    Examination: General: overweight middle aged female in NAD HENT: [place]/AT, #6 cuffed Shiley in place site clean and dry.  Lungs: Rhonchi that clear with coughing. No distress.  Cardiovascular: Tachy, regular, no MRG Abdomen: Soft,  non-tender, non-distended Extremities: No acute deformity, No edema.  Neuro: Alert, oriented, non-focal. Anxious.    Assessment & Plan:   Tracheostomy Status: secondary to COVID-19 ARDS. #6 cuff placed 12/28. She has been off the vent 24/7 since 11/3. She is currently requiring 28% FiO2 on aerosolized trach collar and has been eating a mechanical soft diet.  - Will plan to downsize to 6 cuffless Shiley. I offered this to her today, but due to anxiety she would prefer to wait until tomorrow.  - Hopefully this will help her advance her diet as well.  - I see no reason at this time she should not be able to work towards decannulation in the coming week or two.  - Supplemental oxygen as needed to maintain O2 sats greater than 90%   Best practice (evaluated daily)  Diet: Per Primary Pain/Anxiety/Delirium protocol (if indicated): NA VAP protocol (if indicated): NA DVT prophylaxis: Per Primary GI prophylaxis: Per Primary Glucose control: Per Primary Mobility: Per Primary Disposition:PCU  Goals of Care:  Code Status: FULL    Georgann Housekeeper, AGACNP-BC [place]  See Amion for personal pager PCCM on call pager [phone]  03/04/2020 2:22 PM

## 2020-03-04 NOTE — Progress Notes (Signed)
Nutrition Follow-up  DOCUMENTATION CODES:   Obesity unspecified  INTERVENTION:   Continue nocturnal tube feeds via Cortrak: - Decrease Vital 1.5 to 65 ml/hr x 12 hours from 1800 to 0600 (total of 780 ml) - Continue ProSource TF 45 ml TID - Free water flushes of 200 ml q 6 hours  Nocturnal tube feeding regimen provides 1290 kcal, 86 grams of protein, and 596 ml of H2O (meets 59% of kcal needs and 75% of protein needs).  Total free water with flushes: 1396 ml  - Continue MVI with minerals daily per tube  - Magic Cup TID with meals, each supplement provides 290 kcal and 9 grams of protein (orange cream flavor)  - Ensure Enlive po BID, each supplement provides 350 kcal and 20 grams of protein, thickened to honey-thick consistency (vanilla flavor)  - Encourage PO intake  NUTRITION DIAGNOSIS:   Inadequate oral intake related to poor appetite,dysphagia as evidenced by meal completion < 50%.  Ongoing, being addressed via supplements and nocturnal tube feeds  GOAL:   Patient will meet greater than or equal to 90% of their needs  Progressing  MONITOR:   PO intake,Diet advancement,Supplement acceptance,Labs,Weight trends,TF tolerance,I & O's  REASON FOR ASSESSMENT:   Consult,Ventilator Enteral/tube feeding initiation and management  ASSESSMENT:   40 yo female admitted with severe COVID-19 pneumonia with ARDS requiring intubation. No PMH other than childhood asthma, anxiety  12/15 - [facility] with ARDS, COVID+ 12/16 - transferred to [facility] 12/17 - Cortrak placed, intubated 12/26 - new info obtained: pt on suboxone with hx of opioid OD in 2018 12/28 - trach placed, off paralytics 01/05 - MBS, diet advanced to dysphagia 2 with honey-thick liquids 01/06 - tolerating TC for 48 hours, transferred out of ICU 01/07 - Cortrak replaced, tip gastric  Spoke with pt and pt's mother at bedside. Pt reports that she did not eat breakfast this morning due to episodes of diarrhea.  Pt reports that she does plan on eating lunch.  Pt eating some of the Magic Cups but not at each meal. Pt not receiving Ensure Enlive supplements per her report. Ensure Enlive ordered BID.  Discussed with pt the need to improve PO intake prior to discontinuing nocturnal tube feeding. Pt in agreement with plan. Pt states that she is scared to have the Cortrak removed because she is not sure that she can eat enough.  Admit weight: 106.2 kg Current weight: 96.4 kg  Current TF: Vital 1.5 @ 80 ml/hr x 12 hours, ProSource TF 45 ml TID  Meal Completion: 5-90% x last 8 documented meals (recent meal completion data missing)  Medications reviewed and include: Ensure Enlive BID, lasix, SSI q 4 hours, levemir 5 units BID, methadone, MVI with minerals, protonix, miralax, thiamine  Labs reviewed: hemoglobin 9.9 CBG's: 82-131 x 24 hours  Diet Order:   Diet Order            DIET DYS 2 Room service appropriate? Yes; Fluid consistency: Honey Thick  Diet effective now                 EDUCATION NEEDS:   Not appropriate for education at this time  Skin:  Skin Assessment: Reviewed RN Assessment  Last BM:  03/04/20 large type 4  Height:   Ht Readings from Last 1 Encounters:  02/06/20 5' 3"  (1.6 m)    Weight:   Wt Readings from Last 1 Encounters:  03/04/20 96.4 kg    BMI:  Body mass index is 37.65  kg/m.  Estimated Nutritional Needs:   Kcal:  2200-2400  Protein:  115-130 grams  Fluid:  >/= 2 L    Gustavus Bryant, MS, RD, LDN Inpatient Clinical Dietitian Please see AMiON for contact information.

## 2020-03-05 ENCOUNTER — Encounter: Payer: Self-pay | Admitting: Nurse Practitioner

## 2020-03-05 ENCOUNTER — Inpatient Hospital Stay (HOSPITAL_COMMUNITY)
Admission: RE | Admit: 2020-03-05 | Payer: Medicaid Other | Source: Intra-hospital | Admitting: Physical Medicine & Rehabilitation

## 2020-03-05 ENCOUNTER — Encounter (HOSPITAL_COMMUNITY): Payer: Self-pay | Admitting: Physical Medicine & Rehabilitation

## 2020-03-05 ENCOUNTER — Other Ambulatory Visit: Payer: Self-pay

## 2020-03-05 ENCOUNTER — Inpatient Hospital Stay (HOSPITAL_COMMUNITY)
Admission: RE | Admit: 2020-03-05 | Discharge: 2020-03-18 | DRG: 092 | Disposition: A | Discharge status: Home / Self Care | Payer: Medicaid Other | Source: Intra-hospital | Attending: Physical Medicine & Rehabilitation | Admitting: Physical Medicine & Rehabilitation

## 2020-03-05 DIAGNOSIS — J9611 Chronic respiratory failure with hypoxia: Secondary | ICD-10-CM | POA: Diagnosis present

## 2020-03-05 DIAGNOSIS — G4700 Insomnia, unspecified: Secondary | ICD-10-CM | POA: Diagnosis present

## 2020-03-05 DIAGNOSIS — Z8249 Family history of ischemic heart disease and other diseases of the circulatory system: Secondary | ICD-10-CM

## 2020-03-05 DIAGNOSIS — Z930 Tracheostomy status: Secondary | ICD-10-CM

## 2020-03-05 DIAGNOSIS — G9389 Other specified disorders of brain: Secondary | ICD-10-CM | POA: Diagnosis present

## 2020-03-05 DIAGNOSIS — J9601 Acute respiratory failure with hypoxia: Secondary | ICD-10-CM | POA: Diagnosis not present

## 2020-03-05 DIAGNOSIS — R3915 Urgency of urination: Secondary | ICD-10-CM | POA: Diagnosis present

## 2020-03-05 DIAGNOSIS — R5381 Other malaise: Secondary | ICD-10-CM | POA: Diagnosis not present

## 2020-03-05 DIAGNOSIS — Z87891 Personal history of nicotine dependence: Secondary | ICD-10-CM | POA: Diagnosis not present

## 2020-03-05 DIAGNOSIS — R7401 Elevation of levels of liver transaminase levels: Secondary | ICD-10-CM | POA: Diagnosis present

## 2020-03-05 DIAGNOSIS — R7989 Other specified abnormal findings of blood chemistry: Secondary | ICD-10-CM

## 2020-03-05 DIAGNOSIS — E221 Hyperprolactinemia: Secondary | ICD-10-CM | POA: Diagnosis present

## 2020-03-05 DIAGNOSIS — R1310 Dysphagia, unspecified: Secondary | ICD-10-CM | POA: Diagnosis not present

## 2020-03-05 DIAGNOSIS — G6281 Critical illness polyneuropathy: Secondary | ICD-10-CM | POA: Diagnosis not present

## 2020-03-05 DIAGNOSIS — R7303 Prediabetes: Secondary | ICD-10-CM | POA: Diagnosis present

## 2020-03-05 DIAGNOSIS — Z6841 Body Mass Index (BMI) 40.0 and over, adult: Secondary | ICD-10-CM

## 2020-03-05 DIAGNOSIS — E876 Hypokalemia: Secondary | ICD-10-CM | POA: Diagnosis present

## 2020-03-05 DIAGNOSIS — F1910 Other psychoactive substance abuse, uncomplicated: Secondary | ICD-10-CM | POA: Diagnosis not present

## 2020-03-05 DIAGNOSIS — U099 Post covid-19 condition, unspecified: Secondary | ICD-10-CM | POA: Diagnosis present

## 2020-03-05 DIAGNOSIS — G7281 Critical illness myopathy: Secondary | ICD-10-CM | POA: Diagnosis not present

## 2020-03-05 DIAGNOSIS — K219 Gastro-esophageal reflux disease without esophagitis: Secondary | ICD-10-CM | POA: Diagnosis present

## 2020-03-05 DIAGNOSIS — R350 Frequency of micturition: Secondary | ICD-10-CM | POA: Diagnosis not present

## 2020-03-05 DIAGNOSIS — F064 Anxiety disorder due to known physiological condition: Secondary | ICD-10-CM | POA: Diagnosis not present

## 2020-03-05 DIAGNOSIS — F1110 Opioid abuse, uncomplicated: Secondary | ICD-10-CM | POA: Diagnosis present

## 2020-03-05 DIAGNOSIS — G894 Chronic pain syndrome: Secondary | ICD-10-CM | POA: Diagnosis present

## 2020-03-05 DIAGNOSIS — R7309 Other abnormal glucose: Secondary | ICD-10-CM | POA: Diagnosis not present

## 2020-03-05 DIAGNOSIS — R739 Hyperglycemia, unspecified: Secondary | ICD-10-CM | POA: Diagnosis not present

## 2020-03-05 DIAGNOSIS — G2581 Restless legs syndrome: Secondary | ICD-10-CM | POA: Diagnosis present

## 2020-03-05 DIAGNOSIS — D62 Acute posthemorrhagic anemia: Secondary | ICD-10-CM | POA: Diagnosis present

## 2020-03-05 DIAGNOSIS — F411 Generalized anxiety disorder: Secondary | ICD-10-CM | POA: Diagnosis present

## 2020-03-05 DIAGNOSIS — G9341 Metabolic encephalopathy: Secondary | ICD-10-CM | POA: Diagnosis not present

## 2020-03-05 DIAGNOSIS — Z823 Family history of stroke: Secondary | ICD-10-CM

## 2020-03-05 DIAGNOSIS — U071 COVID-19: Secondary | ICD-10-CM | POA: Diagnosis not present

## 2020-03-05 DIAGNOSIS — J1282 Pneumonia due to coronavirus disease 2019: Secondary | ICD-10-CM | POA: Diagnosis not present

## 2020-03-05 LAB — GLUCOSE, CAPILLARY
Glucose-Capillary: 101 mg/dL — ABNORMAL HIGH (ref 70–99)
Glucose-Capillary: 117 mg/dL — ABNORMAL HIGH (ref 70–99)
Glucose-Capillary: 123 mg/dL — ABNORMAL HIGH (ref 70–99)
Glucose-Capillary: 144 mg/dL — ABNORMAL HIGH (ref 70–99)
Glucose-Capillary: 72 mg/dL (ref 70–99)
Glucose-Capillary: 73 mg/dL (ref 70–99)

## 2020-03-05 MED ORDER — FUROSEMIDE 20 MG PO TABS: 20.0000 mg | ORAL_TABLET | Freq: Every day | ORAL | Status: DC

## 2020-03-05 MED ORDER — FUROSEMIDE 20 MG PO TABS
20.0000 mg | ORAL_TABLET | Freq: Every day | ORAL | Status: DC
  Administered 2020-03-06 – 2020-03-10 (×5): 20 mg via ORAL
  Filled 2020-03-05 (×5): qty 1

## 2020-03-05 MED ORDER — SENNOSIDES-DOCUSATE SODIUM 8.6-50 MG PO TABS: 1.0000 | ORAL_TABLET | Freq: Every evening | ORAL | Status: DC | PRN

## 2020-03-05 MED ORDER — INSULIN DETEMIR 100 UNIT/ML ~~LOC~~ SOLN
5.0000 [IU] | Freq: Two times a day (BID) | SUBCUTANEOUS | Status: DC
  Administered 2020-03-05 – 2020-03-14 (×17): 5 [IU] via SUBCUTANEOUS
  Filled 2020-03-05 (×21): qty 0.05

## 2020-03-05 MED ORDER — HYDROMORPHONE HCL 2 MG PO TABS
1.0000 mg | ORAL_TABLET | Freq: Two times a day (BID) | ORAL | Status: DC | PRN
  Administered 2020-03-05: 1 mg via ORAL
  Filled 2020-03-05 (×2): qty 1

## 2020-03-05 MED ORDER — HYDROMORPHONE HCL 2 MG PO TABS: 1.0000 mg | ORAL_TABLET | Freq: Two times a day (BID) | ORAL | Status: DC | PRN

## 2020-03-05 MED ORDER — ONDANSETRON HCL 4 MG/2ML IJ SOLN: 4.0000 mg | Freq: Four times a day (QID) | INTRAMUSCULAR | Status: DC | PRN

## 2020-03-05 MED ORDER — DIPHENHYDRAMINE HCL 12.5 MG/5ML PO ELIX
12.5000 mg | ORAL_SOLUTION | Freq: Four times a day (QID) | ORAL | Status: DC | PRN
  Filled 2020-03-05: qty 10

## 2020-03-05 MED ORDER — CHLORHEXIDINE GLUCONATE 0.12% ORAL RINSE (MEDLINE KIT)
15.0000 mL | Freq: Two times a day (BID) | OROMUCOSAL | Status: DC
  Administered 2020-03-05 – 2020-03-11 (×5): 15 mL via OROMUCOSAL

## 2020-03-05 MED ORDER — METHADONE HCL 10 MG PO TABS: 10.0000 mg | ORAL_TABLET | Freq: Two times a day (BID) | ORAL | 0 refills | Status: DC

## 2020-03-05 MED ORDER — BISACODYL 10 MG RE SUPP: 10.0000 mg | Freq: Every day | RECTAL | Status: DC | PRN

## 2020-03-05 MED ORDER — INSULIN ASPART 100 UNIT/ML ~~LOC~~ SOLN
0.0000 [IU] | SUBCUTANEOUS | Status: DC
  Administered 2020-03-06 – 2020-03-16 (×5): 3 [IU] via SUBCUTANEOUS

## 2020-03-05 MED ORDER — PROCHLORPERAZINE MALEATE 5 MG PO TABS
5.0000 mg | ORAL_TABLET | Freq: Four times a day (QID) | ORAL | Status: DC | PRN
  Administered 2020-03-07: 5 mg via ORAL
  Filled 2020-03-05: qty 1

## 2020-03-05 MED ORDER — METOPROLOL TARTRATE 12.5 MG HALF TABLET
12.5000 mg | ORAL_TABLET | Freq: Two times a day (BID) | ORAL | Status: DC
  Administered 2020-03-05: 12.5 mg via ORAL
  Filled 2020-03-05: qty 1

## 2020-03-05 MED ORDER — PROCHLORPERAZINE 25 MG RE SUPP: 12.5000 mg | Freq: Four times a day (QID) | RECTAL | Status: DC | PRN

## 2020-03-05 MED ORDER — ALUM & MAG HYDROXIDE-SIMETH 200-200-20 MG/5ML PO SUSP: 30.0000 mL | ORAL | Status: DC | PRN

## 2020-03-05 MED ORDER — ALPRAZOLAM 0.5 MG PO TABS
1.0000 mg | ORAL_TABLET | Freq: Three times a day (TID) | ORAL | Status: DC | PRN
  Administered 2020-03-05 – 2020-03-06 (×3): 1 mg via ORAL
  Filled 2020-03-05 (×4): qty 2

## 2020-03-05 MED ORDER — TRAZODONE HCL 50 MG PO TABS
25.0000 mg | ORAL_TABLET | Freq: Every evening | ORAL | Status: DC | PRN
  Administered 2020-03-06 – 2020-03-12 (×3): 50 mg via ORAL
  Filled 2020-03-05 (×6): qty 1

## 2020-03-05 MED ORDER — FLEET ENEMA 7-19 GM/118ML RE ENEM: 1.0000 | ENEMA | Freq: Once | RECTAL | Status: DC | PRN

## 2020-03-05 MED ORDER — MELATONIN 3 MG PO TABS
3.0000 mg | ORAL_TABLET | Freq: Every day | ORAL | Status: DC
  Administered 2020-03-05 – 2020-03-17 (×12): 3 mg via ORAL
  Filled 2020-03-05 (×12): qty 1

## 2020-03-05 MED ORDER — HYDROMORPHONE HCL 2 MG PO TABS: 1.0000 mg | ORAL_TABLET | Freq: Two times a day (BID) | ORAL | 0 refills | Status: DC | PRN

## 2020-03-05 MED ORDER — ORAL CARE MOUTH RINSE
15.0000 mL | OROMUCOSAL | Status: DC
  Administered 2020-03-05 – 2020-03-08 (×22): 15 mL via OROMUCOSAL

## 2020-03-05 MED ORDER — ACETAMINOPHEN 325 MG PO TABS
325.0000 mg | ORAL_TABLET | ORAL | Status: DC | PRN
  Administered 2020-03-05 – 2020-03-18 (×29): 650 mg via ORAL
  Filled 2020-03-05 (×34): qty 2

## 2020-03-05 MED ORDER — PROCHLORPERAZINE EDISYLATE 10 MG/2ML IJ SOLN: 5.0000 mg | Freq: Four times a day (QID) | INTRAMUSCULAR | Status: DC | PRN

## 2020-03-05 MED ORDER — METOPROLOL TARTRATE 25 MG PO TABS: 12.5000 mg | ORAL_TABLET | Freq: Two times a day (BID) | ORAL | Status: DC

## 2020-03-05 MED ORDER — PANTOPRAZOLE SODIUM 40 MG PO TBEC
40.0000 mg | DELAYED_RELEASE_TABLET | Freq: Two times a day (BID) | ORAL | Status: DC
  Administered 2020-03-05 – 2020-03-18 (×26): 40 mg via ORAL
  Filled 2020-03-05 (×27): qty 1

## 2020-03-05 MED ORDER — METHADONE HCL 10 MG PO TABS
10.0000 mg | ORAL_TABLET | Freq: Two times a day (BID) | ORAL | Status: DC
  Administered 2020-03-05 – 2020-03-18 (×26): 10 mg via ORAL
  Filled 2020-03-05 (×26): qty 1

## 2020-03-05 MED ORDER — CHLORHEXIDINE GLUCONATE CLOTH 2 % EX PADS: 6.0000 | MEDICATED_PAD | Freq: Every day | CUTANEOUS | Status: DC

## 2020-03-05 MED ORDER — CLOBETASOL PROPIONATE 0.05 % EX CREA
1.0000 "application " | TOPICAL_CREAM | Freq: Two times a day (BID) | CUTANEOUS | Status: DC
  Administered 2020-03-05 – 2020-03-18 (×14): 1 via TOPICAL
  Filled 2020-03-05: qty 15

## 2020-03-05 MED ORDER — GUAIFENESIN 100 MG/5ML PO SOLN
5.0000 mL | ORAL | Status: DC | PRN
  Filled 2020-03-05: qty 5

## 2020-03-05 MED ORDER — METOPROLOL TARTRATE 12.5 MG HALF TABLET
12.5000 mg | ORAL_TABLET | Freq: Two times a day (BID) | ORAL | Status: DC
  Administered 2020-03-05 – 2020-03-18 (×26): 12.5 mg via ORAL
  Filled 2020-03-05 (×26): qty 1

## 2020-03-05 MED ORDER — ENOXAPARIN SODIUM 40 MG/0.4ML ~~LOC~~ SOLN
40.0000 mg | SUBCUTANEOUS | Status: DC
  Administered 2020-03-05 – 2020-03-17 (×13): 40 mg via SUBCUTANEOUS
  Filled 2020-03-05 (×13): qty 0.4

## 2020-03-05 MED ORDER — THIAMINE HCL 100 MG PO TABS
100.0000 mg | ORAL_TABLET | Freq: Every day | ORAL | Status: DC
  Administered 2020-03-06 – 2020-03-18 (×13): 100 mg via ORAL
  Filled 2020-03-05 (×13): qty 1

## 2020-03-05 MED ORDER — ALPRAZOLAM 1 MG PO TABS: 1.0000 mg | ORAL_TABLET | Freq: Three times a day (TID) | ORAL | 0 refills | Status: DC | PRN

## 2020-03-05 MED ORDER — ADULT MULTIVITAMIN W/MINERALS CH
1.0000 | ORAL_TABLET | Freq: Every day | ORAL | Status: DC
  Administered 2020-03-06 – 2020-03-17 (×12): 1 via ORAL
  Filled 2020-03-05 (×13): qty 1

## 2020-03-05 MED ORDER — RESOURCE THICKENUP CLEAR PO POWD
ORAL | Status: DC | PRN
  Filled 2020-03-05 (×2): qty 125

## 2020-03-05 NOTE — H&P (Signed)
Physical Medicine and Rehabilitation Admission H&P    Chief Complaint  Patient presents with  . Debility due to Covid 19 ARDS and withdrawal    HPI: Belinda Sanderlin. [name] is a 40 year old female with history of anxiety d/o, morbid obesity, GERD who was admitted on 01/27/20 via [facility] with hypoxia, confusion and delirium wht hypotension due to Covid 19 PNA.  History taken from chart review and patient due to cognition.  Patient was sedated with precedex due to agitation, intubated and treated with Covid protocol-Remdesiver, barcitinib, Solumedrol and antibiotics. Hospital course significant for issues with agitation and combativeness (required fentanyl, Precedex, ketamine, versed)  as well as worsening of respiratory status due to ARDS requiring intubation. CT head unremarkable for acute intracranial process on 12/15 and again on 12/27.  UDS positive for benzo's and opiates. She was started on methadone on 02/16/2020 due to concerns of drug withdrawal and on 12/26 family (confirmed with patient) reported that patient had been taking her fianc's Suboxone for the past year and ( hx of accidental ibuprofen/ETOH OD in 2007--question "another substance" per mother)  Hospital course further complicated by acute lower UTI.  Klebsiella UTI with sepsis treated with Unasyn.  Neurology consulted for input on refractory delirium and encephalopathy, which was felt to be multifactorial. EEG done negative for seizures and showed severe diffuse encephalopathy.  She developed ARDS with CAP v/s aspiration and underwent tracheostomy on 02/19/2020 by Dr. Tamala Julian.  She was being weaned to ATC and on 02/25/2020 found to have disconjugate gaze with question of Left> right sided weakness.  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  QTc being monitored while on methadone with as needed Haldol.  Mentation improving and scheduled hydromorphone and methadone being weaned off. Risperdal DC'd due to concerns of tremors/jerks. Xanax resumed per home regimen but she was reporting  diarrhea with attempts to wean dilaudid and increased to 1 mg tid and methadone increased to 10 mg bid on 03/04/2020 fluid overload treated with intermittent IV diuresis and now on oral lasix. Tolerating dysphagia 2 with honey liquids but continues on nocturnal tube feeds due to poor intake. PCCM following for input and recommended downsize to cuffless #6 but patient wanted to wait until today due to anxiety. Therapy has been ongoing and patient limited by decreased activity tolerance, hypoxia with activity,  weakness with flexed posture, narrow base of support with unsteadiness and LE fatigue with ambulation. CIR recommended due to functional deficits in mobility and ADLs.  Please see preadmission assessment from earlier today as well.  Review of Systems  Constitutional: Positive for malaise/fatigue. Negative for chills and fever.  HENT: Positive for sinus pain. Negative for hearing loss.   Eyes: Positive for double vision (intermittently).  Respiratory: Positive for cough and shortness of breath (with anxiety/activity).   Cardiovascular: Negative for chest pain and palpitations.  Gastrointestinal: Positive for heartburn and nausea.  Genitourinary: Negative for dysuria and urgency.  Musculoskeletal: Positive for myalgias.  Skin: Negative for itching and rash.  Neurological: Positive for tremors, speech change and weakness. Negative for dizziness, sensory change and headaches.  Psychiatric/Behavioral: Positive for depression. The patient is nervous/anxious.   All other systems reviewed and are negative.   Past Medical History:  Diagnosis Date  . Anxiety disorder   . Fatty liver   .  GAD (generalized anxiety  disorder)    with panic attacks  . GERD (gastroesophageal reflux disease)   . Morbid obesity ([place])   . Narcotic drug use   . OCD (obsessive compulsive disorder)   . Psoriasis      Past Surgical History:  Procedure Laterality Date  . DILATION AND CURETTAGE OF UTERUS    . ESOPHAGOGASTRODUODENOSCOPY  07/2018     Family History  Problem Relation Age of Onset  . High blood pressure Father   . Stroke Father   . High blood pressure Brother      Social History: Lives with fiance and 39 year old. 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 She used to smoke 1/2 PPD and does not use any somokless tobacco. She denies any alcohol use. She denies any IV or other illicit drug abuse.     Allergies  Allergen Reactions  . Buspar [Buspirone]     Mania/insomnia  . Celexa [Citalopram]     mania  . Doxycycline Diarrhea    diarrhea  . Wellbutrin [Bupropion]     insomnia  . Zoloft [Sertraline]     mania     Medications Prior to Admission  Medication Sig Dispense Refill  . albuterol (VENTOLIN HFA) 108 (90 Base) MCG/ACT inhaler Inhale 2 puffs into the lungs every 6 (six) hours as needed for wheezing or shortness of breath.    . ALPRAZolam (XANAX) 1 MG tablet Take 1 mg by mouth 4 (four) times daily as needed for anxiety.    . cetirizine (ZYRTEC) 10 MG tablet Take 10 mg by mouth daily.    . clobetasol (TEMOVATE) 0.05 % external solution Apply 1 application topically 2 (two) times daily as needed (skin irritation).    Marland Kitchen dexlansoprazole (DEXILANT) 60 MG capsule Take 60 mg by mouth daily.    . famotidine (PEPCID) 40 MG tablet Take 40 mg by mouth daily.    . fluticasone (FLONASE) 50 MCG/ACT nasal spray Place 2 sprays into both nostrils daily.    . fluticasone (FLOVENT HFA) 110 MCG/ACT inhaler Inhale 1 puff into the lungs 2 (two) times daily.    Marland Kitchen linaclotide (LINZESS) 290 MCG CAPS capsule Take 290 mcg by mouth daily before breakfast.    . cefdinir (OMNICEF) 300 MG capsule Take 300 mg by mouth 2  (two) times daily. For 10 days (Patient not taking: Reported on 02/08/2020)      Drug Regimen Review  Drug regimen was reviewed and remains appropriate with no significant issues identified  Home: Home Living Family/patient expects to be discharged to:: Private residence Living Arrangements: Children Available Help at Discharge: Family,Available 24 hours/day Type of Home: Apartment Home Access: Stairs to enter Entrance Stairs-Rails: None Home Layout: One level Bathroom Shower/Tub: Multimedia programmer: Handicapped height Bathroom Accessibility: Yes Additional Comments: pt unable to provide home setup or PLOF and no family present  Lives With: Other (Comment)   Functional History: Prior Function Level of Independence: Independent  Functional Status:  Mobility: Bed Mobility Overal bed mobility: Needs Assistance Bed Mobility: Supine to Sit Rolling: Min assist,+2 for safety/equipment Supine to sit: HOB elevated,Min assist Sit to supine: Supervision General bed mobility comments: Pt OOB in recliner at beginning and end of session Transfers Overall transfer level: Needs assistance Equipment used: Rolling walker (2 wheeled) Transfers: Sit to/from Stand Sit to Stand: Min assist Stand pivot transfers: Mod assist,+2 physical assistance General transfer comment: min +2 assist to stand from recliner initially, minguard for additional standing with  repeated cues for hand placement Ambulation/Gait Ambulation/Gait assistance: Min assist,+2 safety/equipment (close chair follow) Gait Distance (Feet): 10 Feet (1x8, 1x10) Assistive device: Rolling walker (2 wheeled) Gait Pattern/deviations: Step-to pattern,Trunk flexed,Narrow base of support General Gait Details: min A for steadying, vc for upright posture and proximity to RW, pt with increased LE fatigue with distance Gait velocity: slowed Gait velocity interpretation: <1.31 ft/sec, indicative of household ambulator     ADL: ADL Overall ADL's : Needs assistance/impaired Eating/Feeding: Supervision/ safety,Set up,Sitting (modifeid diet with PMSV) Grooming: Set up,Sitting Grooming Details (indicate cue type and reason): in reclienr Upper Body Bathing: Minimal assistance,Sitting Lower Body Bathing: Moderate assistance,Sit to/from stand Upper Body Dressing : Minimal assistance,Sitting Lower Body Dressing: Moderate assistance,Sit to/from stand Lower Body Dressing Details (indicate cue type and reason): able to donn socks using figure four position in bed Toilet Transfer: Minimal assistance,Stand-pivot Toilet Transfer Details (indicate cue type and reason): has been using BSC with RN in addition to therapy! Toileting- Clothing Manipulation and Hygiene: Moderate assistance,Sit to/from stand Functional mobility during ADLs: Minimal assistance,Rolling walker,Cueing for safety General ADL Comments: increased time spent helping pt comb through her matted hair  Cognition: Cognition Overall Cognitive Status: Impaired/Different from baseline Orientation Level: Oriented X4 Cognition Arousal/Alertness: Awake/alert Behavior During Therapy: WFL for tasks assessed/performed,Anxious Overall Cognitive Status: Impaired/Different from baseline Area of Impairment: Safety/judgement,Awareness Orientation Level: Disoriented to,Time Current Attention Level: Sustained Memory: Decreased recall of precautions,Decreased short-term memory Following Commands: Follows one step commands consistently Safety/Judgement: Decreased awareness of safety,Decreased awareness of deficits Awareness: Emergent Problem Solving: Slow processing General Comments: cognition continues to improve. good awareness of anxiety, relaxation methods coached  Physical Exam: Blood pressure 119/77, pulse (!) 114, temperature 98.8 F (37.1 C), temperature source Oral, resp. rate (!) 27, height 5' 3"  (1.6 m), weight 96.4 kg, last menstrual period  02/29/2020, SpO2 97 %. Physical Exam Vitals reviewed.  Constitutional:      Appearance: She is obese.     Comments: Obese female. Lying in bed with PMSV in place.   HENT:     Head: Normocephalic and atraumatic.     Nose: Nose normal.  Eyes:     General:        Right eye: No discharge.        Left eye: No discharge.     Extraocular Movements: Extraocular movements intact.  Neck:     Comments: + Trach with PMV and trach collar Cardiovascular:     Rate and Rhythm: Regular rhythm. Tachycardia present.  Pulmonary:     Effort: Pulmonary effort is normal. No respiratory distress.     Breath sounds: No stridor.  Abdominal:     General: Abdomen is flat. Bowel sounds are normal. There is no distension.  Musculoskeletal:     Comments: No edema or tenderness in extremities  Skin:    General: Skin is warm and dry.  Neurological:     Mental Status: She is alert.     Comments: Alert and oriented, except for date of month Sensation intact to light touch Motor: 4 -/5 throughout   Psychiatric:        Mood and Affect: Mood is anxious.        Cognition and Memory: Cognition is impaired.     Results for orders placed or performed during the hospital encounter of 02/06/20 (from the past 48 hour(s))  Glucose, capillary     Status: Abnormal   Collection Time: 03/03/20 11:56 AM  Result Value Ref Range   Glucose-Capillary 105 (H)  70 - 99 mg/dL    Comment: Glucose reference range applies only to samples taken after fasting for at least 8 hours.  Glucose, capillary     Status: None   Collection Time: 03/03/20  4:16 PM  Result Value Ref Range   Glucose-Capillary 82 70 - 99 mg/dL    Comment: Glucose reference range applies only to samples taken after fasting for at least 8 hours.  Glucose, capillary     Status: Abnormal   Collection Time: 03/03/20  8:17 PM  Result Value Ref Range   Glucose-Capillary 105 (H) 70 - 99 mg/dL    Comment: Glucose reference range applies only to samples taken after  fasting for at least 8 hours.  CBC     Status: Abnormal   Collection Time: 03/04/20 12:13 AM  Result Value Ref Range   WBC 7.1 4.0 - 10.5 K/uL   RBC 3.31 (L) 3.87 - 5.11 MIL/uL   Hemoglobin 9.9 (L) 12.0 - 15.0 g/dL   HCT 31.0 (L) 36.0 - 46.0 %   MCV 93.7 80.0 - 100.0 fL   MCH 29.9 26.0 - 34.0 pg   MCHC 31.9 30.0 - 36.0 g/dL   RDW 13.7 11.5 - 15.5 %   Platelets 309 150 - 400 K/uL   nRBC 0.3 (H) 0.0 - 0.2 %    Comment: Performed at [facility] [address], [place], [place] [address]  Basic metabolic panel     Status: Abnormal   Collection Time: 03/04/20 12:13 AM  Result Value Ref Range   Sodium 135 135 - 145 mmol/L   Potassium 3.7 3.5 - 5.1 mmol/L   Chloride 98 98 - 111 mmol/L   CO2 25 22 - 32 mmol/L   Glucose, Bld 125 (H) 70 - 99 mg/dL    Comment: Glucose reference range applies only to samples taken after fasting for at least 8 hours.   BUN 16 6 - 20 mg/dL   Creatinine, Ser 0.61 0.44 - 1.00 mg/dL   Calcium 8.6 (L) 8.9 - 10.3 mg/dL   GFR, Estimated >60 >60 mL/min    Comment: (NOTE) Calculated using the CKD-EPI Creatinine Equation (2021)    Anion gap 12 5 - 15    Comment: Performed at [facility] [address], [place], [place] [address]  Glucose, capillary     Status: Abnormal   Collection Time: 03/04/20 12:30 AM  Result Value Ref Range   Glucose-Capillary 123 (H) 70 - 99 mg/dL    Comment: Glucose reference range applies only to samples taken after fasting for at least 8 hours.  Glucose, capillary     Status: Abnormal   Collection Time: 03/04/20  4:09 AM  Result Value Ref Range   Glucose-Capillary 131 (H) 70 - 99 mg/dL    Comment: Glucose reference range applies only to samples taken after fasting for at least 8 hours.  Glucose, capillary     Status: Abnormal   Collection Time: 03/04/20  8:04 AM  Result Value Ref Range   Glucose-Capillary 104 (H) 70 - 99 mg/dL    Comment: Glucose reference range applies only to samples taken after fasting for at least  8 hours.  Glucose, capillary     Status: Abnormal   Collection Time: 03/04/20 11:34 AM  Result Value Ref Range   Glucose-Capillary 118 (H) 70 - 99 mg/dL    Comment: Glucose reference range applies only to samples taken after fasting for at least 8 hours.   Comment  1 Notify RN   Glucose, capillary     Status: None   Collection Time: 03/04/20  4:24 PM  Result Value Ref Range   Glucose-Capillary 78 70 - 99 mg/dL    Comment: Glucose reference range applies only to samples taken after fasting for at least 8 hours.  Glucose, capillary     Status: Abnormal   Collection Time: 03/04/20  8:12 PM  Result Value Ref Range   Glucose-Capillary 103 (H) 70 - 99 mg/dL    Comment: Glucose reference range applies only to samples taken after fasting for at least 8 hours.  Glucose, capillary     Status: Abnormal   Collection Time: 03/05/20 12:03 AM  Result Value Ref Range   Glucose-Capillary 123 (H) 70 - 99 mg/dL    Comment: Glucose reference range applies only to samples taken after fasting for at least 8 hours.  Glucose, capillary     Status: Abnormal   Collection Time: 03/05/20  4:15 AM  Result Value Ref Range   Glucose-Capillary 101 (H) 70 - 99 mg/dL    Comment: Glucose reference range applies only to samples taken after fasting for at least 8 hours.  Glucose, capillary     Status: Abnormal   Collection Time: 03/05/20  9:42 AM  Result Value Ref Range   Glucose-Capillary 117 (H) 70 - 99 mg/dL    Comment: Glucose reference range applies only to samples taken after fasting for at least 8 hours.   No results found.     Medical Problem List and Plan: 1.  Decreased activity tolerance, hypoxia with activity, generalized weakness with flexed posture, narrow base of support with unsteadiness and LE fatigue with ambulation secondary to CIM.  -patient may not shower  -ELOS/Goals: 15-18 days/supervision  Admit to CIR 2.  Antithrombotics: -DVT/anticoagulation:  Pharmaceutical: Lovenox  -antiplatelet  therapy: NA 3. Substance abuse/Pain Management: On dilaudid and methadone-->is not ready to wean yet.   --would like to get back to Xanax 1 mg qid   Plan to wean 4. Mood: LCSW to follow for evaluation and support.   Will request neuropsych evaluation  -antipsychotic agents: N/A--Risperdal d/c  5. Neuropsych: This patient is not fully capable of making decisions on her own behalf. 6. Skin/Wound Care: Routine pressure relief measures.  7. Fluids/Electrolytes/Nutrition: Monitor I/O.   CMP ordered for tomorrow 8. Anxiety disorder: Question of Bipolar d/o-->patient denies and reports mania due to multiple psych medications. Does not want to try buspar or klonopin-->prefers to get back to "home regimen".    See #4 9. VDRF secondary to Covid 19 PNA/sepsis: Last course of antibiotics for MSSA VAP completed 01/02. Trach to be downsized to CFS #6.  10. GERD: Protonix bid not as effective and would like to get back on dexilant 11. Hyperglycemia due to tube feeds: Hgb A1C-5.8.  12. Dysphagia: Continue dysphagia #2, honey liquids. TF adjusted to HS yesterday. Will start calorie count to monitor intake. AKI resolved with water flushes--need to encourage thickened fluids intake.   Monitor with increased mobility  Bary Leriche, PA-C 03/05/2020  I have personally performed a face to face diagnostic evaluation, including, but not limited to relevant history and physical exam findings, of this patient and developed relevant assessment and plan.  Additionally, I have reviewed and concur with the physician assistant's documentation above.  Delice Lesch, MD, ABPMR  The patient's status has not changed. Any changes from the pre-admission screening or documentation from the acute chart are noted above.   Belinda Johannesen  Posey Pronto, MD, ABPMR

## 2020-03-05 NOTE — Progress Notes (Signed)
  Speech Language Pathology Treatment: Dysphagia  Patient Details Name: Belinda [name] MRN: [id number] DOB: 06/11/1980 Today's Date: 03/05/2020 Time: 4403-4742 SLP Time Calculation (min) (ACUTE ONLY): 13 min  Assessment / Plan / Recommendation Clinical Impression  Pt was very anxious today about her impending trach change, so education and reassurance were provided. She still participated in dysphagia tx, drinking honey thick liquids via straw as well as advanced trials of thin liquids. She initially only wanted to try water via small spoonfuls, but with encouragement did take one cup sip. No overt s/s of aspiration noted (although on previous MBS, aspiration was silent). Discussed with pt that we need to be able to challenge her more with thin liquids to try to advance her, and she feels more comfortable trying this during repeat MBS. Will tentatively plan for next date to allow time for trach change first today.    HPI HPI: Pt is a 40 year old female with medical history significant for obesity.  She presented to [facility] ED on 12/15 with 24 to 48 hours of AMS following exposure to her COVID+ son. CXR showed diffuse bilateral airspace disease, she was confused and agitated. Physical restraints applied on admission and Precedex infusion given.  She required 100% nonrebreather mask and was transferred to [facility] for further intervention. CXR 12/29: Improved bilateral ventilation with residual confluent bibasilar opacity. CT head negative. ETT 12/17-trach 12/28. Stat CT head completed on 1/3 due to increased weakness noted by PT and was negative.      SLP Plan  MBS       Recommendations  Diet recommendations: Dysphagia 2 (fine chop);Honey-thick liquid Liquids provided via: Cup;Straw Medication Administration: Whole meds with puree Supervision: Patient able to self feed Compensations: Slow rate;Small sips/bites Postural Changes and/or Swallow Maneuvers: Seated upright 90  degrees      Patient may use Passy-Muir Speech Valve: During all therapies with supervision;Intermittently with supervision PMSV Supervision: Full         Oral Care Recommendations: Oral care BID Follow up Recommendations: Inpatient Rehab SLP Visit Diagnosis: Dysphagia, unspecified (R13.10) Plan: MBS       GO                Osie Bond., M.A. [facility] Pager [phone] Office [phone]  03/05/2020, 11:13 AM

## 2020-03-05 NOTE — Progress Notes (Signed)
Physical Therapy Treatment Patient Details Name: Belinda [name] MRN: [id number] DOB: Jul 05, 1980 Today's Date: 03/05/2020    History of Present Illness 40 yo admitted 12/15 with AMS, fever, ARDS due to Covid 19 (+) and UTI. Intubated 12/17, trach 12/28. Pt with initial agitation/ delirium during admission. 1/3 pt with impaired balance and function with CT negative for stroke. PMhx: obesity    PT Comments    Pt talking with SLP about trach replacement scheduled for today. Pt is very anxious. SLP encouraging working with PT to shift her focus. Pt agreeable however anxious throughout session. Pt , however agreeable to walking with therapy to shift her focus. Pt request use of BSC prior to ambulation and is able to stand pivot with min guard. Pt asks for wet wash cloth for pericare. PT encouraged independence with task and pt very proud of being able to perform independently. Pt is min A -min guard for sit<>stand from recliner during hallway ambulation. Pt able to progress ambulation however continues to require max encouragement for level of independence due to pt anxiety. Continually, pointing out progress made. Pt will likely discharge to CIR today and will be a wonderful pt given her work ethic, however will require encouragement for pushing outside her comfort zone.     Follow Up Recommendations  CIR;Supervision/Assistance - 24 hour     Equipment Recommendations  Rolling walker with 5" wheels;3in1 (PT)    Recommendations for Other Services       Precautions / Restrictions Precautions Precautions: Fall Precaution Comments: trach, cortrak, watch HR Restrictions Weight Bearing Restrictions: No    Mobility  Bed Mobility               General bed mobility comments: Pt OOB in recliner at beginning and end of session  Transfers Overall transfer level: Needs assistance Equipment used: Rolling walker (2 wheeled);None Transfers: Sit to/from [employer] to  Stand: Min guard;Min assist Stand pivot transfers: Min guard       General transfer comment: min guard for stand pivot to [facility], heavy UE support on furniture for support in making pivot, min A for steadying with initial sit <>stand from recliner for ambulation, continues to need cuing for hand placement for power up, min guard for additional 2x sit<>stand for ambulation  Ambulation/Gait Ambulation/Gait assistance: Min assist;+2 safety/equipment (close chair follow) Gait Distance (Feet): 15 Feet (1x10, 1x12, 1x15) Assistive device: Rolling walker (2 wheeled) Gait Pattern/deviations: Trunk flexed;Step-through pattern Gait velocity: slowed Gait velocity interpretation: <1.31 ft/sec, indicative of household ambulator General Gait Details: contact guard assist for pt reassurance, initial bout pt with decreased confidence in strength, needing to "try out" strength prior to weight shift to stance, R LE>L LE, on additional bouts of ambulation increased confidence and improved gait. pt very pleased with progress         Balance Overall balance assessment: Needs assistance Sitting-balance support: Feet supported;Bilateral upper extremity supported Sitting balance-Leahy Scale: Fair   Postural control: Right lateral lean Standing balance support: Bilateral upper extremity supported Standing balance-Leahy Scale: Poor Standing balance comment: reliant on external support, right lean with gait                            Cognition Arousal/Alertness: Awake/alert Behavior During Therapy: Anxious Overall Cognitive Status: Impaired/Different from baseline Area of Impairment: Safety/judgement;Awareness  Safety/Judgement: Decreased awareness of deficits Awareness: Emergent   General Comments: pt very anxious about trach change, worried it will be painful, or not go well, constant redirect back to task at hand         General Comments General comments  (skin integrity, edema, etc.): 5L FiO2 28%O2 for ambulation, SaO2>92%O2 with ambulation. pt with increase RR 30, improves with removal of mask, max HR 136 bpm      Pertinent Vitals/Pain Pain Assessment: Faces Faces Pain Scale: No hurt           PT Goals (current goals can now be found in the care plan section) Acute Rehab PT Goals Patient Stated Goal: to get stronger and go to rehab PT Goal Formulation: Patient unable to participate in goal setting Time For Goal Achievement: 03/06/20 Potential to Achieve Goals: Fair Progress towards PT goals: Progressing toward goals    Frequency    Min 3X/week      PT Plan Current plan remains appropriate       AM-PAC PT "6 Clicks" Mobility   Outcome Measure  Help needed turning from your back to your side while in a flat bed without using bedrails?: A Little Help needed moving from lying on your back to sitting on the side of a flat bed without using bedrails?: A Little Help needed moving to and from a bed to a chair (including a wheelchair)?: A Little Help needed standing up from a chair using your arms (e.g., wheelchair or bedside chair)?: A Little Help needed to walk in hospital room?: A Little Help needed climbing 3-5 steps with a railing? : Total 6 Click Score: 16    End of Session Equipment Utilized During Treatment: Gait belt Activity Tolerance: Patient tolerated treatment well Patient left: in chair;with call bell/phone within reach;with chair alarm set Nurse Communication: Mobility status PT Visit Diagnosis: Other abnormalities of gait and mobility (R26.89);Muscle weakness (generalized) (M62.81);Other symptoms and signs involving the nervous system (E31.540)     Time: 0867-6195 PT Time Calculation (min) (ACUTE ONLY): 39 min  Charges:  $Gait Training: 8-22 mins $Therapeutic Activity: 8-22 mins                     Radhika Dershem B. Migdalia Dk PT, DPT Acute Rehabilitation Services Pager [phone] Office [phone]    [place] 03/05/2020, 2:10 PM

## 2020-03-05 NOTE — Progress Notes (Signed)
PCCM INTERVAL PROGRESS NOTE   #6 cuffed trach replaced with # 6 shiley cuffless. ETCO2 deviced confirmed placement. Patient tolerated well.  Minor trach site bleeding. EBL < 10 mL Bilateral breath sounds ++   Georgann Housekeeper, AGACNP-BC [place] for personal pager PCCM on call pager [phone]  03/05/2020 1:47 PM

## 2020-03-05 NOTE — Progress Notes (Signed)
PMR Admission Coordinator Pre-Admission Assessment  Patient: Belinda [name] is an 39 y.o., female MRN: [id number] DOB: 05/15/1980 Height: 5' 3" (160 cm) Weight: 96.4 kg  Insurance Information HMO:     PPO:      PCP:      IPA:      80/20:      OTHER:  PRIMARY: [employer] Medicaid       Policy#: [id number]      Subscriber: Pt.  CM Name: Azel     Phone#: [phone]  Fax#:[phone] Pre-Cert#: [id number]      Employer: Benefits:  Phone #: :[phone]         Name:  Eff. Date:08/23/2019     Deduct: $0      Out of Pocket Max: $0     Life Max: n/a CIR: $0 copay     SNF: $0 copay, limited by medical necessity review.  Outpatient: $3 copay  Home Health: $0 copay, limited by medical necessity review.  DME: $0 copay, limited by medical necessity review.  Providers: in network  SECONDARY: n/a  Financial Counselor: n/a      Phone#:   The "Data Collection Information Summary" for patients in Inpatient Rehabilitation Facilities with attached "Privacy Act Statement-Health Care Records" was provided and verbally reviewed with: N/A  Emergency Contact Information Contact Information    Name Relation Home Work Mobile   Hollinghead, Donnie Father   [phone]   Hollinghead, Diane Mother [phone]     blake, donnie Son [phone]     Capurro, bret Son   [phone]      Current Medical History  Patient Admitting Diagnosis: COVID, ARDS History of Present Illness:  HPI: Belinda [name] is a 39 year old female with history of anxiety d/o, morbid obesity, GERD who was admitted on 01/27/20 via [facility] with hypoxia, confusion and delirium wht hypotension due to Covid 19 PNA. She was sedated with precedex due to agitation, intubated and treated with Covid protocol-Remdesiver, barcitinib, Solumedrol and antibiotics. Hospital course significant for issues with combativeness (required fentanyl, precedex, ketamine, versed)  as well as worsening of respiratory status due to ARDS requiring intubation. CT  head negative 12/15 and 12/27. UDS positive for benzo's and opiates. She was started on methadone 12/25 due to concerns of drug withdrawal and on 12/26 family (confirmed with patient) reported that patient had been taking her fiance's Suboxone for the past year and ( hx of accidental ibuprofen/ETOH OD in 2007--question "another substance" per mother)  Klebsiella UTI with sepsis treated with Unasyn. Neurology consulted for input on refractory delirium and encephalopathy felt to be multifactorial.  EEG done negative for seizures and showed severe diffuse encephalopathy.  She developed ARDS  With CAP v/s aspiration and underwent tracheostomy on 12/28 by Dr. Smith.  She was being weaned to ATC and on 01/03 found to have dysconjugate gaze with question of Left> right sided weakness.  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   Mentation improving and scheduled hydromorphone and methadone being weaned off. Risperdal d/c due to concerns of tremors/jerks. Xanax resumed per home regimen but she was reporting  diarrhea with attempts to wean dilaudid and increased to 1 mg tid and methadone increased to 10 mg bid on 01/11. Fluid overload treated with   intermittent IV diuresis and now on oral lasix. Tolerating dysphagia 2 with honey liquids but continues on nocturnal tube feeds due to poor intake. PCCM following for input and recommended downsize to cuffless #6 but patient wanted to wait till today due to anxiety. Therapy has been ongoing and patient limited by decreased activity tolerance, hypoxia with activity,  weakness with flexed posture, narrow base of support with unsteadiness and LE fatigue with ambulation. CIR  recommended due to functional deficits in mobility and ADLs.  Patient's medical record from [facility] has been reviewed by the rehabilitation admission coordinator and physician.  Past Medical History  Past Medical History:  Diagnosis Date  . Anxiety disorder   . Fatty liver   . GAD (generalized anxiety disorder)    with panic attacks  . GERD (gastroesophageal reflux disease)   . Morbid obesity (HCC)   . Narcotic drug use   . OCD (obsessive compulsive disorder)   . Psoriasis     Family History   family history includes High blood pressure in her brother and father; Stroke in her father.  Prior Rehab/Hospitalizations Has the patient had prior rehab or hospitalizations prior to admission? No  Has the patient had major surgery during 100 days prior to admission? Yes   Current Medications  Current Facility-Administered Medications:  .  0.9 %  sodium chloride infusion, , Intravenous, PRN, Marshall, Jessica, DO, Paused at 02/25/20 0849 .  acetaminophen (TYLENOL) tablet 650 mg, 650 mg, Oral, Q6H PRN, Krishnan, Gokul, MD, 650 mg at 03/04/20 2206 .  ALPRAZolam (XANAX) tablet 1 mg, 1 mg, Oral, TID PRN, Joseph, Preetha, MD, 1 mg at 03/05/20 0155 .  chlorhexidine gluconate (MEDLINE KIT) (PERIDEX) 0.12 % solution 15 mL, 15 mL, Mouth Rinse, BID, Mannam, Praveen, MD, 15 mL at 03/04/20 2059 .  Chlorhexidine Gluconate Cloth 2 % PADS 6 each, 6 each, Topical, Daily, Babcock, Peter E, NP, 6 each at 03/01/20 0849 .  clobetasol cream (TEMOVATE) 0.05 % 1 application, 1 application, Topical, BID, Joseph, Preetha, MD, 1 application at 03/03/20 2138 .  enoxaparin (LOVENOX) injection 47.5 mg, 0.5 mg/kg, Subcutaneous, Q24H, Icard, Bradley L, DO, 47.5 mg at 03/04/20 2118 .  feeding supplement (ENSURE ENLIVE / ENSURE PLUS) liquid 237 mL, 237 mL, Oral, BID BM, Krishnan, Gokul, MD, 237 mL at 03/01/20 1020 .  feeding supplement (VITAL 1.5 CAL) liquid 780 mL, 780 mL, Per Tube, Q24H, Joseph,  Preetha, MD, Last Rate: 65 mL/hr at 03/04/20 1846, 780 mL at 03/04/20 1846 .  furosemide (LASIX) tablet 20 mg, 20 mg, Oral, Daily, Krishnan, Gokul, MD, 20 mg at 03/05/20 0929 .  guaiFENesin (ROBITUSSIN) 100 MG/5ML solution 100 mg, 5 mL, Oral, Q4H PRN, Krishnan, Gokul, MD, 100 mg at 03/04/20 1710 .  HYDROmorphone (DILAUDID) tablet 1 mg, 1 mg, Oral, BID PRN, Joseph, Preetha, MD .  insulin aspart (novoLOG) injection 0-20 Units, 0-20 Units, Subcutaneous, Q4H, Babcock, Peter E, NP, 3 Units at 03/05/20 0032 .  insulin detemir (LEVEMIR) injection 5 Units, 5 Units, Subcutaneous, BID, Krishnan, Gokul, MD, 5 Units at 03/05/20 0928 .  ipratropium-albuterol (DUONEB) 0.5-2.5 (3) MG/3ML nebulizer solution 3 mL, 3 mL, Nebulization, Q6H PRN, Marshall, Jessica, DO .  lactated ringers infusion, , Intravenous, Continuous, Babcock, Peter E, NP, Last Rate: 10 mL/hr at 02/08/20 1118, Infusion Verify at 02/08/20 1118 .  MEDLINE mouth rinse, 15 mL, Mouth Rinse, 10 times per day, Mannam, Praveen, MD, 15 mL at 03/05/20 0930 .  melatonin tablet 3 mg, 3 mg, Oral,   QHS, Krishnan, Gokul, MD, 3 mg at 03/04/20 2117 .  methadone (DOLOPHINE) tablet 10 mg, 10 mg, Oral, Q12H, Joseph, Preetha, MD, 10 mg at 03/05/20 0928 .  multivitamin with minerals tablet 1 tablet, 1 tablet, Oral, Daily, Krishnan, Gokul, MD, 1 tablet at 03/05/20 0929 .  ondansetron (ZOFRAN) injection 4 mg, 4 mg, Intravenous, Q6H PRN, Krishnan, Gokul, MD, 4 mg at 03/03/20 2153 .  pantoprazole (PROTONIX) EC tablet 40 mg, 40 mg, Oral, BID AC, Vann, Jessica U, DO, 40 mg at 03/05/20 0600 .  Resource ThickenUp Clear, , Oral, PRN, Krishnan, Gokul, MD, Given at 02/27/20 2005 .  thiamine tablet 100 mg, 100 mg, Oral, Daily, Krishnan, Gokul, MD, 100 mg at 03/05/20 0929  Patients Current Diet:  Diet Order            DIET DYS 2 Room service appropriate? Yes; Fluid consistency: Honey Thick  Diet effective now                 Precautions /  Restrictions Precautions Precautions: Fall Precaution Comments: trach, cortrak, watch HR Restrictions Weight Bearing Restrictions: No   Has the patient had 2 or more falls or a fall with injury in the past year? No  Prior Activity Level Community (5-7x/wk): Pt. was active in the community PTA  Prior Functional Level Self Care: Did the patient need help bathing, dressing, using the toilet or eating? Independent  Indoor Mobility: Did the patient need assistance with walking from room to room (with or without device)? Independent  Stairs: Did the patient need assistance with internal or external stairs (with or without device)? Independent  Functional Cognition: Did the patient need help planning regular tasks such as shopping or remembering to take medications? Independent  Home Assistive Devices / Equipment Home Assistive Devices/Equipment: None  Prior Device Use: Indicate devices/aids used by the patient prior to current illness, exacerbation or injury? None of the above  Current Functional Level Cognition  Overall Cognitive Status: Impaired/Different from baseline Current Attention Level: Sustained Orientation Level: Oriented X4 Following Commands: Follows one step commands consistently Safety/Judgement: Decreased awareness of safety,Decreased awareness of deficits General Comments: cognition continues to improve. good awareness of anxiety, relaxation methods coached    Extremity Assessment (includes Sensation/Coordination)  Upper Extremity Assessment: Generalized weakness RUE Deficits / Details: shoulder 2+/5 - ablet o lift against gravity through full ROM; elbow @ 3/5; wirst/hand @ 3/5. poor in-hand manipulation skills due to weakness, however able to manipulate toothette; appears stronger than L RUE Coordination: decreased fine motor,decreased gross motor LUE Deficits / Details: Pt appears to have more difficulty with in-hand manipulation skills with L hand and reports  that it "feels different"strength similar to R LUE Sensation: decreased light touch (pt reprots feels "different") LUE Coordination: decreased fine motor,decreased gross motor  Lower Extremity Assessment: Defer to PT evaluation    ADLs  Overall ADL's : Needs assistance/impaired Eating/Feeding: Supervision/ safety,Set up,Sitting (modifeid diet with PMSV) Grooming: Set up,Sitting Grooming Details (indicate cue type and reason): in reclienr Upper Body Bathing: Minimal assistance,Sitting Lower Body Bathing: Moderate assistance,Sit to/from stand Upper Body Dressing : Minimal assistance,Sitting Lower Body Dressing: Moderate assistance,Sit to/from stand Lower Body Dressing Details (indicate cue type and reason): able to donn socks using figure four position in bed Toilet Transfer: Minimal assistance,Stand-pivot Toilet Transfer Details (indicate cue type and reason): has been using BSC with RN in addition to therapy! Toileting- Clothing Manipulation and Hygiene: Moderate assistance,Sit to/from stand Functional mobility during ADLs: Minimal assistance,Rolling walker,Cueing for safety General   ADL Comments: increased time spent helping pt comb through her matted hair    Mobility  Overal bed mobility: Needs Assistance Bed Mobility: Supine to Sit Rolling: Min assist,+2 for safety/equipment Supine to sit: HOB elevated,Min assist Sit to supine: Supervision General bed mobility comments: Pt OOB in recliner at beginning and end of session    Transfers  Overall transfer level: Needs assistance Equipment used: Rolling walker (2 wheeled) Transfers: Sit to/from Stand Sit to Stand: Min assist Stand pivot transfers: Mod assist,+2 physical assistance General transfer comment: min +2 assist to stand from recliner initially, minguard for additional standing with repeated cues for hand placement    Ambulation / Gait / Stairs / Wheelchair Mobility  Ambulation/Gait Ambulation/Gait assistance: Min assist,+2  safety/equipment (close chair follow) Gait Distance (Feet): 10 Feet (1x8, 1x10) Assistive device: Rolling walker (2 wheeled) Gait Pattern/deviations: Step-to pattern,Trunk flexed,Narrow base of support General Gait Details: min A for steadying, vc for upright posture and proximity to RW, pt with increased LE fatigue with distance Gait velocity: slowed Gait velocity interpretation: <1.31 ft/sec, indicative of household ambulator    Posture / Balance Dynamic Sitting Balance Sitting balance - Comments: R bias; poor midline orientation Balance Overall balance assessment: Needs assistance Sitting-balance support: Feet supported,Bilateral upper extremity supported Sitting balance-Leahy Scale: Fair Sitting balance - Comments: R bias; poor midline orientation Postural control: Right lateral lean Standing balance support: Bilateral upper extremity supported Standing balance-Leahy Scale: Poor Standing balance comment: reliant on external support, right lean with gait    Special needs/care consideration Oxygen: on 8 L via trach collar, Skin: serous blister on R arm, ecchymosis on bilateral upper and lower extremities.  Special service needs: Pt. With Coretrak and #6 cuffed trach, tolerating full deflation   Previous Home Environment (from acute therapy documentation) Living Arrangements: Children  Lives With: Other (Comment) Available Help at Discharge: Family,Available 24 hours/day Type of Home: Apartment Home Layout: One level Home Access: Stairs to enter Entrance Stairs-Rails: None Bathroom Shower/Tub: Walk-in shower Bathroom Toilet: Handicapped height Bathroom Accessibility: Yes How Accessible: Accessible via walker,Accessible via wheelchair Home Care Services: No Additional Comments: pt unable to provide home setup or PLOF and no family present  Discharge Living Setting Plans for Discharge Living Setting: Patient's home Type of Home at Discharge: Apartment Discharge Home Layout:  One level Discharge Home Access: Level entry Discharge Bathroom Shower/Tub: Walk-in shower Discharge Bathroom Toilet: Handicapped height Discharge Bathroom Accessibility: Yes How Accessible: Accessible via walker,Accessible via wheelchair Does the patient have any problems obtaining your medications?: No  Social/Family/Support Systems Patient Roles: Parent Contact Information: [phone] Anticipated Caregiver: Diane Hollinghead (mother) Anticipated Caregiver's Contact Information: [phone] Ability/Limitations of Caregiver: Can provide Min A Caregiver Availability: 24/7 Discharge Plan Discussed with Primary Caregiver: Yes Is Caregiver In Agreement with Plan?: Yes Does Caregiver/Family have Issues with Lodging/Transportation while Pt is in Rehab?: No  Goals Patient/Family Goal for Rehab: PT/SLP/OT Supervision Expected length of stay: 10-12 days Pt/Family Agrees to Admission and willing to participate: Yes Program Orientation Provided & Reviewed with Pt/Caregiver Including Roles  & Responsibilities: Yes  Decrease burden of Care through IP rehab admission: Specialzed equipment needs, Decannulation, Diet advancement, Decrease number of caregivers and Patient/family education  Possible need for SNF placement upon discharge: not anticipated  Patient Condition: I have reviewed medical records from [facility], spoken with CM, and patient and family member. I met with patient at the bedside and discussed via phone for inpatient rehabilitation assessment.  Patient will benefit from ongoing PT, OT and   SLP, can actively participate in 3 hours of therapy a day 5 days of the week, and can make measurable gains during the admission.  Patient will also benefit from the coordinated team approach during an Inpatient Acute Rehabilitation admission.  The patient will receive intensive therapy as well as Rehabilitation physician, nursing, social worker, and care management  interventions.  Due to safety, skin/wound care, disease management, medication administration, pain management and patient education the patient requires 24 hour a day rehabilitation nursing.  The patient is currently min A +2  with mobility and basic ADLs.  Discharge setting and therapy post discharge at home with home health is anticipated.  Patient has agreed to participate in the Acute Inpatient Rehabilitation Program and will admit tomorrow.  Preadmission Screen Completed By:  Kijuan Gallicchio B Shantrell Placzek, 03/05/2020 11:40 AM ______________________________________________________________________   Discussed status with Dr. Patel  on 03/05/20 at  9:30 and received approval for admission today.  Admission Coordinator:  Amneet Cendejas B Yina Riviere, CCC-SLP, time 11:40 /Date 03/05/20  Assessment/Plan: Diagnosis: Covid debility 1. Does the need for close, 24 hr/day Medical supervision in concert with the patient's rehab needs make it unreasonable for this patient to be served in a less intensive setting? Yes 2. Co-Morbidities requiring supervision/potential complications: anxiety disorder (ensure anxiety and resulting apprehension do not limit functional progress; consider prn medications if warranted), morbid obesity, GERD, delirium, tachypnea (monitor RR and O2 Sats with increased physical exertion), Tachycardia (monitor in accordance with pain and increasing activity), dysphagia (advance diet as tolerated) 3. Due to safety, disease management, pain management and patient education, does the patient require 24 hr/day rehab nursing? Yes 4. Does the patient require coordinated care of a physician, rehab nurse, PT, OT, and SLP to address physical and functional deficits in the context of the above medical diagnosis(es)? Yes Addressing deficits in the following areas: balance, endurance, locomotion, strength, transferring, bathing, dressing, toileting, speech, swallowing and psychosocial support 5. Can the patient actively  participate in an intensive therapy program of at least 3 hrs of therapy 5 days a week? Yes 6. The potential for patient to make measurable gains while on inpatient rehab is excellent and good 7. Anticipated functional outcomes upon discharge from inpatient rehab: supervision PT, supervision OT, modified independent SLP 8. Estimated rehab length of stay to reach the above functional goals is: 10-14 days. 9. Anticipated discharge destination: Home 10. Overall Rehab/Functional Prognosis: excellent   MD Signature: Ankit Patel, MD, ABPMR 

## 2020-03-05 NOTE — Discharge Summary (Addendum)
Physician Discharge Summary  ARSHIA SPELLMAN KWI:[id number] DOB: Nov 19, 1980 DOA: 02/06/2020  PCP: Kathyrn Drown, MD  Admit date: 02/06/2020 Discharge date: 03/05/2020  Time spent: 45 minutes  Recommendations for Outpatient Follow-up:  Pulmonary to follow for trach management in [place] off tube feeds as diet advanced Will need psych follow-up after discharge Needs outpatient endocrinology evaluation for incidentally noted pituitary lesion  Discharge Diagnoses:  Acute hypoxic respiratory failure Status post tracheostomy ARDS COVID-19 pneumonia Polysubstance abuse Acute metabolic encephalopathy Anxiety Pituitary lesion   Hypokalemia   Hypoalbuminemia   Elevated liver enzymes   COVID-19   Acute respiratory failure [facility])   Discharge Condition: Stable  Diet recommendation: Dysphagia 2 diet, honey thick liquids  Filed Weights   03/03/20 0358 03/04/20 0550 03/05/20 0359  Weight: 98.3 kg 96.4 kg 96.4 kg    History of present illness:  39/F with history of anxiety and GERD presented to [facility], ER on 12/15 with severe hypoxic respiratory failure, ARDS and metabolic encephalopathy secondary to Covid she was intubated and admitted to the ICU  -Subsequently has completed Covid treatment, had prolonged respiratory failure and eventually had a tracheostomy on 12/28 -During hospitalization there was concern for opiate versus benzo withdrawal, she was started on methadone and scheduled Dilaudid by CCM team  -Now stable on trach collar, weaned off ventilator, on tube feeds  Via cortrak  Hospital Course:   Acute hypoxic respiratory failure/ARDS/status post tracheostomy  -Prolonged respiratory failure secondary to ARDS from Covid -Underwent tracheostomy placement on 12/28 -Subsequently has been weaned off the ventilator and stable on trach collar since 1/3 -Also treated for aspiration pneumonia -Pulmonary following, plan to downsize to a cuffless Shiley tube today , hopefully  can be decannulated next week -SLP following, recommended dysphagia 2 diet, oral intake has been very poor,  Currently on tube feeds at night, slowly taper off tube feeds as diet advanced -She will be discharged to CIR for rehab today  Pneumonia due to COVID-19  -Completed treatment with IV remdesivir, steroids and baricitinib -Came off Covid isolation on 1/6  Klebsiella urinary tract infection with sepsis syndrome suspected aspiration pneumonia  Vs VAP -Completed Unasyn course on 12/20  -Resp CxMSSA on respiratory culture, completed cefazolin 02/23/18/2022 -Stable off antibiotics at this time  Acute metabolic encephalopathy Vent dyssynchrony, agitation -Improved, this is secondary to Covid pneumonia, prolonged respiratory failure, meds, possibly also component of benzo, opiate withdrawal -Started on Risperdal in the ICU for agitation, subsequently started reporting involuntary jerks and twitches,  possibly extrapyramidal symptoms, I tapered her off risperidone -Resumed Xanax per home regimen  -Started methadone in the ICU, see below  Anxiety Polypharmacy -At home on Xanax 4 times daily as needed, patient's mother reported ongoing Suboxone use for at least 1 year -She was started on scheduled methadone per CCM along with scheduled hydromorphone -Continue methadone, will attempt to wean down Dilaudid as tolerated  Volume overload ECHO normal 02/12/20.  -Diuresed with IV Lasix early this admission, now euvolemic continue oral Lasix  Anemia of critical illness Hemoglobin stable.  No evidence of overt bleeding. Anemia panel reviewed. Folate was 18.9, B12 590, ferritin 630.  Nutrition/dysphagia -SLP following, continue dysphagia 2 diet, nectar thick liquids -Gradually wean off tube feeds and advance diet as tolerated  Prediabetes with Hyperglycemia HbA1c 5.8.   -CBGs high due to tube feeds, currently stable on Levemir and NovoLog, will cut this down as tube feeds are weaned  off  Hypernatrmia -Resolved  Concern for pituitary lesion This was  incidentally noted on MRI brain.  Will need a dedicated pituitary protocol MRI when patient is more stable.  Thyroid function tests were normal: TSH 3.45 and free T4 1.03.   Luteinizing hormone level noted to be 0.6.  Difficult to clinically interpret this level at this time.   Prolactin level significantly elevated at 115.  -Patient however had been on Risperdal while in ICU which can increase the level of prolactin.   IGF-I/Somatomedin-C level was 121 which is in the normal range, plasma ACTH also within normal range -Needs endocrinology follow-up after discharge   Discharge Exam: Vitals:   03/05/20 0858 03/05/20 1139  BP: 119/77 110/78  Pulse: (!) 114 (!) 119  Resp: (!) 27 (!) 26  Temp: 98.8 F (37.1 C) 98.5 F (36.9 C)  SpO2: 97% 96%    General: Awake alert, anxious, no distress otherwise Cardiovascular: S1-S2, regular rhythm Respiratory: Few scattered rhonchi and conducted upper airway sounds  Discharge Instructions    Allergies as of 03/05/2020      Reactions   Buspar [buspirone]    Mania/insomnia   Celexa [citalopram]    mania   Doxycycline Diarrhea   diarrhea   Wellbutrin [bupropion]    insomnia   Zoloft [sertraline]    mania      Medication List    STOP taking these medications   cefdinir 300 MG capsule Commonly known as: OMNICEF     TAKE these medications   albuterol 108 (90 Base) MCG/ACT inhaler Commonly known as: VENTOLIN HFA Inhale 2 puffs into the lungs every 6 (six) hours as needed for wheezing or shortness of breath.   ALPRAZolam 1 MG tablet Commonly known as: XANAX Take 1 tablet (1 mg total) by mouth 3 (three) times daily as needed for anxiety. What changed: when to take this   cetirizine 10 MG tablet Commonly known as: ZYRTEC Take 10 mg by mouth daily.   clobetasol 0.05 % external solution Commonly known as: TEMOVATE Apply 1 application topically 2 (two) times  daily as needed (skin irritation).   Dexilant 60 MG capsule Generic drug: dexlansoprazole Take 60 mg by mouth daily.   famotidine 40 MG tablet Commonly known as: PEPCID Take 40 mg by mouth daily.   fluticasone 110 MCG/ACT inhaler Commonly known as: FLOVENT HFA Inhale 1 puff into the lungs 2 (two) times daily.   fluticasone 50 MCG/ACT nasal spray Commonly known as: FLONASE Place 2 sprays into both nostrils daily.   furosemide 20 MG tablet Commonly known as: LASIX Take 1 tablet (20 mg total) by mouth daily. Start taking on: March 06, 2020   HYDROmorphone 2 MG tablet Commonly known as: DILAUDID Take 0.5 tablets (1 mg total) by mouth 2 (two) times daily as needed for severe pain or moderate pain.   linaclotide 290 MCG Caps capsule Commonly known as: LINZESS Take 290 mcg by mouth daily before breakfast.   methadone 10 MG tablet Commonly known as: DOLOPHINE Take 1 tablet (10 mg total) by mouth every 12 (twelve) hours.   metoprolol tartrate 25 MG tablet Commonly known as: LOPRESSOR Take 0.5 tablets (12.5 mg total) by mouth 2 (two) times daily.      Allergies  Allergen Reactions  . Buspar [Buspirone]     Mania/insomnia  . Celexa [Citalopram]     mania  . Doxycycline Diarrhea    diarrhea  . Wellbutrin [Bupropion]     insomnia  . Zoloft [Sertraline]     mania      The results of  significant diagnostics from this hospitalization (including imaging, microbiology, ancillary and laboratory) are listed below for reference.    Significant Diagnostic Studies: DG Chest 1 View  Result Date: 02/08/2020 CLINICAL DATA:  OG tube placement. EXAM: CHEST  1 VIEW COMPARISON:  Chest x-ray 02/07/2020. FINDINGS: 539 hours. Bilateral airspace disease noted, left greater than right. Endotracheal tube tip is approximately 4.3 cm above the base of the carina. Left IJ central line tip overlies the innominate vein confluence. NG tube tip is positioned in the stomach with the proximal  side port below the GE junction. Telemetry leads overlie the chest. IMPRESSION: NG tube tip is in the stomach with proximal side port of the tube below the GE junction. Electronically Signed   By: Misty Stanley M.D.   On: 02/08/2020 05:54   CT HEAD WO CONTRAST  Result Date: 02/18/2020 CLINICAL DATA:  Delirium. Altered mental status. EXAM: CT HEAD WITHOUT CONTRAST TECHNIQUE: Contiguous axial images were obtained from the base of the skull through the vertex without intravenous contrast. COMPARISON:  CT head without contrast 09/01/2017 at [facility]. FINDINGS: Brain: No acute infarct, hemorrhage, or mass lesion is present. No significant white matter lesions are present. The ventricles are of normal size. No significant extraaxial fluid collection is present. The brainstem and cerebellum are within normal limits. Vascular: No hyperdense vessel or unexpected calcification. Skull: Calvarium is intact. No focal lytic or blastic lesions are present. No significant extracranial soft tissue lesion is present. Sinuses/Orbits: Patient is intubated. Fluid level is present in the right maxillary sinus. Fluid is present in the nasopharynx. Fluid is present in the sphenoid sinuses and posterior ethmoid air cells bilaterally. Left nasogastric tube is noted. Minimal mastoid fluid is present inferiorly on both sides. IMPRESSION: 1. Normal CT appearance of the brain. 2. Fluid in the paranasal sinuses and nasopharynx likely related to intubation. 3. Minimal mastoid fluid inferiorly on both sides. Electronically Signed   By: San Morelle M.D.   On: 02/18/2020 02:16   CT Head Wo Contrast  Result Date: 02/06/2020 CLINICAL DATA:  Altered mental status.  Recent tooth extraction. EXAM: CT HEAD WITHOUT CONTRAST TECHNIQUE: Contiguous axial images were obtained from the base of the skull through the vertex without intravenous contrast. COMPARISON:  Head CT 09/01/2017 FINDINGS: Brain: No intracranial hemorrhage, mass  effect, or midline shift. No hydrocephalus. The basilar cisterns are patent. No evidence of territorial infarct or acute ischemia. No extra-axial or intracranial fluid collection. Vascular: No hyperdense vessel or unexpected calcification. Skull: No fracture or focal lesion. Sinuses/Orbits: Mucosal thickening of the right greater than left maxillary sinus. Scattered mucosal thickening throughout ethmoid air cells. Patient appears edentulous of upper teeth, presumed prior periapical lucency in the right upper molars, contiguous with the right maxillary sinus. No acute orbital abnormality. Mastoid air cells are clear. Other: None. IMPRESSION: 1. No acute intracranial abnormality. 2. Paranasal sinus disease. 3. Patient appears edentulous of upper teeth, presumed prior periapical lucency in the right upper molars, contiguous with the right maxillary sinus. CLINICAL DATA:  Altered mental status. Recent tooth extraction. EXAM: CT HEAD WITHOUT CONTRAST TECHNIQUE: Contiguous axial images were obtained from the base of the skull through the vertex without intravenous contrast. COMPARISON:  Head CT 09/01/2017 FINDINGS: Brain: No intracranial hemorrhage, mass effect, or midline shift. No hydrocephalus. The basilar cisterns are patent. No evidence of territorial infarct or acute ischemia. No extra-axial or intracranial fluid collection. Vascular: No hyperdense vessel or unexpected calcification. Skull: No fracture or focal lesion. Sinuses/Orbits: Mucosal thickening  of the right greater than left maxillary sinus. Scattered mucosal thickening throughout ethmoid air cells. Patient appears edentulous of upper teeth, presumed prior periapical lucency in the right upper molars, contiguous with the right maxillary sinus. No acute orbital abnormality. Mastoid air cells are clear. Other: None. IMPRESSION: 1. No acute intracranial abnormality. 2. Paranasal sinus mucosal thickening. 3. Patient appears edentulous of upper teeth, presumed  prior periodontal disease in the right upper molars, contiguous with the right maxillary sinus. Electronically Signed   By: Keith Rake M.D.   On: 02/06/2020 22:18   MR BRAIN WO CONTRAST  Result Date: 02/25/2020 CLINICAL DATA:  Neuro deficit, acute, stroke suspected. Additional history provided: COVID positive. EXAM: MRI HEAD WITHOUT CONTRAST TECHNIQUE: Multiplanar, multiecho pulse sequences of the brain and surrounding structures were obtained without intravenous contrast. COMPARISON:  Noncontrast head CT 02/25/2020. FINDINGS: Brain: Intermittently motion degraded exam. Most notably, there is moderate motion degradation of the axial T2/FLAIR sequence. Small focus of chronic cortical encephalomalacia within the anterolateral right frontal lobe (for instance as seen on series 7, images 14-16). 2 x 10 mm (AP x TV) T1 hyperintense lesion within the mid to posterior pituitary gland (for instance as seen on series 10, image 36). No focal parenchymal signal abnormality is identified elsewhere. There is no acute infarct. No extra-axial fluid collection. No midline shift. Vascular: Expected proximal arterial flow voids. Skull and upper cervical spine: No focal marrow lesion. Sinuses/Orbits: Visualized orbits show no acute finding. Paranasal sinus disease. Most notably, there is extensive partial opacification of the bilateral sphenoid sinuses and moderate right maxillary sinus mucosal thickening. Other: Small bilateral mastoid effusions. IMPRESSION: 1. Intermittently motion degraded exam. 2. No evidence of acute infarct. 3. Small focus of chronic cortical encephalomalacia within the anterolateral right frontal lobe. 4. 2 x 10 mm T1 hyperintense lesion within the mid-to-posterior pituitary gland. 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 However, a cystic adenoma or adenoma with prior hemorrhage cannot be excluded. Correlate with relevant laboratory values. A pituitary protocol brain MRI may be  obtained for further characterization, if clinically warranted. 5. Otherwise unremarkable non-contrast MRI appearance of the brain. 6. Paranasal sinus disease, most notably severe bilateral sphenoid sinusitis. 7. Small bilateral mastoid effusions. Electronically Signed   By: Kellie Simmering DO   On: 02/25/2020 17:45   DG Chest Port 1 View  Result Date: 02/20/2020 CLINICAL DATA:  40 year old female COVID-27. EXAM: PORTABLE CHEST 1 VIEW COMPARISON:  Portable chest 02/19/2020 and earlier. FINDINGS: Portable AP semi upright view at 0448 hours. Stable tracheostomy tube, left IJ approach central line and visible enteric tube. Larger lung volumes and improved ventilation. Largely regressed left upper lung opacity since yesterday. Residual confluent bibasilar opacity, including suspected left lower lobe collapse or consolidation. No pneumothorax. No definite pleural effusion. No acute osseous abnormality identified. Paucity of bowel gas in the upper abdomen. IMPRESSION: 1. Improved bilateral ventilation with residual confluent bibasilar opacity. 2. No new cardiopulmonary abnormality identified. 3.  Stable lines and tubes. Electronically Signed   By: Genevie Ann M.D.   On: 02/20/2020 07:03   DG Chest Port 1 View  Result Date: 02/19/2020 CLINICAL DATA:  Post tracheostomy, COVID positive EXAM: PORTABLE CHEST 1 VIEW COMPARISON:  Earlier same day FINDINGS: Tracheostomy device is present approximately 5 cm above the carina. Left IJ central line and enteric tube are again identified. Persistent bilateral pulmonary opacities similar to the prior study. Probable small pleural effusions. No pneumothorax. Similar cardiomediastinal contours. IMPRESSION: New tracheostomy device.  Otherwise stable lines and tubes. Similar bilateral pulmonary opacities. Electronically Signed   By: Macy Mis M.D.   On: 02/19/2020 16:48   DG Chest Port 1 View  Result Date: 02/19/2020 CLINICAL DATA:  Hypoxia.  Respiratory failure.  COVID-19.  EXAM: PORTABLE CHEST 1 VIEW COMPARISON:  02/17/2020. FINDINGS: Endotracheal tube, feeding tube, left IJ line in stable position. Cardiomegaly with pulmonary venous congestion. Bilateral interstitial infiltrates again noted. Bibasilar atelectasis. Small bilateral pleural effusions again noted. No pneumothorax. IMPRESSION: 1. Lines and tubes in stable position. 2. Cardiomegaly with pulmonary venous congestion. 3. Bilateral interstitial infiltrates again noted. Bibasilar atelectasis. Small bilateral pleural effusions again noted. Electronically Signed   By: Marcello Moores  Register   On: 02/19/2020 05:19   DG CHEST PORT 1 VIEW  Result Date: 02/17/2020 CLINICAL DATA:  Intubation EXAM: PORTABLE CHEST 1 VIEW COMPARISON:  02/16/2020 FINDINGS: No significant interval change in AP portable chest radiograph, with diffuse bilateral interstitial heterogeneous airspace opacity, most conspicuous at the lung bases. Mild cardiomegaly. Support apparatus is unchanged including endotracheal tube, esophagogastric tube, and left neck vascular catheter. IMPRESSION: 1. No significant interval change in AP portable chest radiograph, with diffuse bilateral interstitial heterogeneous airspace opacity, most conspicuous at the lung bases. Findings are consistent with multifocal infection, edema, and/or ARDS. 2.  Unchanged support apparatus. Electronically Signed   By: Eddie Candle M.D.   On: 02/17/2020 11:26   DG CHEST PORT 1 VIEW  Result Date: 02/16/2020 CLINICAL DATA:  Altered mental status EXAM: PORTABLE CHEST 1 VIEW COMPARISON:  Yesterday FINDINGS: Endotracheal tube with tip halfway between the clavicular heads and carina. The feeding tube at least reaches the diaphragm. Left IJ line with tip at the brachiocephalic SVC confluence. Extensive bilateral pneumonia. Cardiomegaly. No visible air leak. IMPRESSION: Stable hardware positioning and extensive pneumonia. Electronically Signed   By: Monte Fantasia M.D.   On: 02/16/2020 07:08   DG  CHEST PORT 1 VIEW  Result Date: 02/15/2020 CLINICAL DATA:  COVID positive.  Ventilator dependence. EXAM: PORTABLE CHEST 1 VIEW COMPARISON:  02/14/2020 FINDINGS: 0531 hours. Endotracheal tube tip is 2.7 cm above the base of the carina. A feeding tube passes into the stomach although the distal tip position is not included on the film. Left IJ central line tip overlies the innominate vein confluence. Stable asymmetric elevation right hemidiaphragm. Relatively diffuse bilateral airspace disease again noted with mid and lower lung predominance. The cardio pericardial silhouette is enlarged. Telemetry leads overlie the chest. IMPRESSION: No substantial interval change in exam. Electronically Signed   By: Misty Stanley M.D.   On: 02/15/2020 09:02   DG CHEST PORT 1 VIEW  Result Date: 02/14/2020 CLINICAL DATA:  ETT EXAM: PORTABLE CHEST 1 VIEW COMPARISON:  Radiograph 02/13/2020 FINDINGS: *Endotracheal tube tip low in the trachea, 2 cm from the carina. Consider retraction 1-2 cm to the mid trachea. *Transesophageal tube tip terminates near the region of the gastric antrum/duodenal bulb. *Left upper IJ approach central venous catheter tip terminates near the left brachiocephalic-caval confluence. Persistent diffuse heterogeneous opacities throughout both lungs in a mid to lower lung predominance, not significantly changed from 1 day prior. Cardiomegaly is stable. Indistinct vascularity. No pneumothorax. Obscuration of the hemidiaphragms may suggest some layering effusion. No acute osseous or soft tissue abnormality. Telemetry leads overlie the chest. IMPRESSION: 1. Endotracheal tube tip low in the trachea, 2 cm from the carina. Consider retraction 1-2 cm to the mid trachea. 2. Stable diffuse heterogeneous opacities throughout both lungs in a mid to lower lung predominance.  Could reflect a combination of edema and infection with likely layering bilateral effusions. These results will be called to the ordering clinician  or representative by the Radiologist Assistant, and communication documented in the PACS or [employer]. Electronically Signed   By: Lovena Le M.D.   On: 02/14/2020 05:39   DG CHEST PORT 1 VIEW  Result Date: 02/13/2020 CLINICAL DATA:  Hypoxia EXAM: PORTABLE CHEST 1 VIEW COMPARISON:  February 12, 2020 FINDINGS: Endotracheal tube tip is 1.7 cm above the carina. Enteric tube tip is below the diaphragm. Central catheter tip is in the left innominate vein near the junction with the superior vena cava. No pneumothorax. There is airspace opacity in both mid and lower lung regions with small pleural effusions bilaterally. Heart is enlarged with pulmonary vascularity normal, stable. No adenopathy. No bone lesions. IMPRESSION: Tube and catheter positions as described without pneumothorax. Persistent airspace opacity in the mid and lower lung regions with bilateral pleural effusions. Cardiomegaly. Overall appearance raises concern for a degree of congestive heart failure. There may well be superimposed pneumonia in the lung bases. Both edema and pneumonia may present concurrently. Electronically Signed   By: Lowella Grip III M.D.   On: 02/13/2020 08:03   DG CHEST PORT 1 VIEW  Result Date: 02/12/2020 CLINICAL DATA:  Endotracheal tube placement. EXAM: PORTABLE CHEST 1 VIEW COMPARISON:  02/11/2020. FINDINGS: Endotracheal tube and feeding tube in stable position. Left IJ line stable position. Cardiomegaly. Low lung volumes with bibasilar atelectasis. Diffuse bilateral pulmonary infiltrates/edema again noted. Small bilateral pleural effusions again noted. Chest is unchanged from prior exam. No pneumothorax. IMPRESSION: 1. Lines and tubes in stable position. 2. Cardiomegaly with persistent bilateral pulmonary infiltrates/edema and small bilateral pleural effusions. 3. Low lung volumes with bibasilar atelectasis. Chest is unchanged from prior exam. Electronically Signed   By: Marcello Moores  Register   On: 02/12/2020  05:21   DG CHEST PORT 1 VIEW  Result Date: 02/11/2020 CLINICAL DATA:  Hypoxia EXAM: PORTABLE CHEST 1 VIEW COMPARISON:  February 08, 2020 FINDINGS: Endotracheal tube tip is 3.0 cm above the carina. Enteric tube tip is below the diaphragm. Central catheter tip is in the superior vena cava slightly beyond the junction with the left innominate vein. No pneumothorax. There is cardiomegaly with pulmonary venous hypertension. There is multifocal airspace opacity throughout the lungs bilaterally with equivocal pleural effusions bilaterally. No bone lesions. No adenopathy appreciable. IMPRESSION: Tube and catheter positions as described without pneumothorax. Cardiomegaly with pulmonary vascular congestion. Small pleural effusions. Multifocal airspace opacity may represent pulmonary edema or multifocal pneumonia. Both entities may be present concurrently. Appearance overall similar to recent study. Electronically Signed   By: Lowella Grip III M.D.   On: 02/11/2020 08:51   DG Chest Port 1 View  Result Date: 02/07/2020 CLINICAL DATA:  Acute respiratory failure. Pneumonia due to COVID-19. EXAM: PORTABLE CHEST 1 VIEW COMPARISON:  Radiograph yesterday. FINDINGS: Progressive heterogeneous bilateral airspace opacities, confluent in the lower lobes. Heart size grossly normal but obscured by adjacent airspace disease. Overall low lung volumes. No evidence of pneumomediastinum or pneumothorax. Soft tissue attenuation from habitus limits assessment. IMPRESSION: Bilateral COVID pneumonia which is progressed from yesterday. Low lung volumes persist. Electronically Signed   By: Keith Rake M.D.   On: 02/07/2020 15:36   DG Chest Port 1 View  Result Date: 02/06/2020 CLINICAL DATA:  Sepsis.  Encephalopathy. EXAM: PORTABLE CHEST 1 VIEW COMPARISON:  None. FINDINGS: Multifocal airspace opacity throughout both lungs. Small pleural effusions. No pneumothorax. IMPRESSION: Multifocal bilateral  airspace opacities concerning  for multifocal infection. Electronically Signed   By: Ulyses Jarred M.D.   On: 02/06/2020 19:36   DG Abd Portable 1V  Result Date: 02/29/2020 CLINICAL DATA:  Feeding tube placement EXAM: PORTABLE ABDOMEN - 1 VIEW COMPARISON:  1 day prior FINDINGS: Feeding tube terminates at the distal stomach or entering the duodenal bulb. Contrast within normal caliber colon. Right hemidiaphragm elevation. Bibasilar airspace disease. IMPRESSION: Feeding tube at the distal stomach or duodenal bulb. Electronically Signed   By: Abigail Miyamoto M.D.   On: 02/29/2020 12:03   DG Abd Portable 1V  Result Date: 02/28/2020 CLINICAL DATA:  OG tube placement. EXAM: PORTABLE ABDOMEN - 1 VIEW COMPARISON:  None. FINDINGS: Small bore feeding tube is noted with tip overlying the duodenum versus distal stomach. No dilated bowel loops are noted within the mid-UPPER abdomen. Contrast is noted within the colon, from swallowing function study performed yesterday. IMPRESSION: Small bore feeding tube with tip overlying the duodenum/distal stomach. No dilated bowel loops identified on this exam. Electronically Signed   By: Margarette Canada M.D.   On: 02/28/2020 17:55   DG Swallowing Func-Speech Pathology  Result Date: 02/27/2020 Objective Swallowing Evaluation: Type of Study: MBS-Modified Barium Swallow Study  Patient Details Name: SHANIQWA HORSMAN MRN: [id number] Date of Birth: 24-Jul-1980 Today's Date: 02/27/2020 Time: SLP Start Time (ACUTE ONLY): 1358 -SLP Stop Time (ACUTE ONLY): 1420 SLP Time Calculation (min) (ACUTE ONLY): 22 min Past Medical History: No past medical history on file. Past Surgical History: No past surgical history on file. HPI: Pt is a 40 year old female with medical history significant for obesity.  She presented to [facility] ED on 12/15 with 24 to 48 hours of AMS following exposure to her COVID+ son. CXR showed diffuse bilateral airspace disease, she was confused and agitated. Physical restraints applied on admission and  Precedex infusion given.  She required 100% nonrebreather mask and was transferred to [facility] for further intervention. CXR 12/29: Improved bilateral ventilation with residual confluent bibasilar opacity. CT head negative. ETT 12/17-trach 12/28. Stat CT head completed on 1/3 due to increased weakness noted by PT and was negative.  No data recorded Assessment / Plan / Recommendation CHL IP CLINICAL IMPRESSIONS 02/27/2020 Clinical Impression Pt demonstrates a moderate oropharyngeal dysphagia likely secondry to prolonged intubation with decreased glottic closure. Pt has adequate oral phase (though no upper dentition) and timely swallow, though there is silent aspiration during the swallow with thin and nectar thick liquids regardless of bolus size and despite a chin tuck. Pt was not wearing her PMSV during this exam (pt COVID positive and PMSV left in room) and Cortrak was also in place. Suspect that pt may be able to quickly upgrade to nectar when cortrak comes out and pt has PMSV in place to aid in cough/throat clear response. For now pt able to initaite a dys 2/honey thick diet. WIll f/u for tolerance. SLP Visit Diagnosis Dysphagia, unspecified (R13.10) Attention and concentration deficit following -- Frontal lobe and executive function deficit following -- Impact on safety and function Moderate aspiration risk   CHL IP TREATMENT RECOMMENDATION 02/27/2020 Treatment Recommendations Therapy as outlined in treatment plan below   Prognosis 02/27/2020 Prognosis for Safe Diet Advancement Good Barriers to Reach Goals -- Barriers/Prognosis Comment -- CHL IP DIET RECOMMENDATION 02/27/2020 SLP Diet Recommendations Dysphagia 2 (Fine chop) solids;Honey thick liquids Liquid Administration via Cup;Straw Medication Administration Whole meds with puree Compensations Slow rate;Small sips/bites Postural Changes Seated upright at 90 degrees  CHL IP OTHER RECOMMENDATIONS 02/27/2020 Recommended Consults -- Oral Care Recommendations -- Other  Recommendations Place PMSV during PO intake;Have oral suction available   CHL IP FOLLOW UP RECOMMENDATIONS 02/27/2020 Follow up Recommendations Inpatient Rehab   CHL IP FREQUENCY AND DURATION 02/27/2020 Speech Therapy Frequency (ACUTE ONLY) min 2x/week Treatment Duration 2 weeks      CHL IP ORAL PHASE 02/27/2020 Oral Phase Impaired Oral - Pudding Teaspoon -- Oral - Pudding Cup -- Oral - Honey Teaspoon WFL Oral - Honey Cup WFL Oral - Nectar Teaspoon WFL Oral - Nectar Cup WFL Oral - Nectar Straw WFL Oral - Thin Teaspoon -- Oral - Thin Cup WFL Oral - Thin Straw WFL Oral - Puree WFL Oral - Mech Soft -- Oral - Regular -- Oral - Multi-Consistency -- Oral - Pill -- Oral Phase - Comment --  CHL IP PHARYNGEAL PHASE 02/27/2020 Pharyngeal Phase Impaired Pharyngeal- Pudding Teaspoon -- Pharyngeal -- Pharyngeal- Pudding Cup -- Pharyngeal -- Pharyngeal- Honey Teaspoon -- Pharyngeal -- Pharyngeal- Honey Cup WFL Pharyngeal -- Pharyngeal- Nectar Teaspoon -- Pharyngeal -- Pharyngeal- Nectar Cup Penetration/Aspiration during swallow;Reduced airway/laryngeal closure Pharyngeal Material enters airway, passes BELOW cords without attempt by patient to eject out (silent aspiration) Pharyngeal- Nectar Straw Penetration/Aspiration during swallow;Reduced airway/laryngeal closure Pharyngeal Material enters airway, passes BELOW cords without attempt by patient to eject out (silent aspiration) Pharyngeal- Thin Teaspoon -- Pharyngeal -- Pharyngeal- Thin Cup Penetration/Aspiration during swallow Pharyngeal Material enters airway, passes BELOW cords without attempt by patient to eject out (silent aspiration) Pharyngeal- Thin Straw Penetration/Aspiration during swallow Pharyngeal Material enters airway, passes BELOW cords without attempt by patient to eject out (silent aspiration) Pharyngeal- Puree WFL Pharyngeal -- Pharyngeal- Mechanical Soft WFL Pharyngeal -- Pharyngeal- Regular -- Pharyngeal -- Pharyngeal- Multi-consistency -- Pharyngeal -- Pharyngeal-  Pill -- Pharyngeal -- Pharyngeal Comment --  No flowsheet data found. DeBlois, Katherene Ponto 02/27/2020, 2:47 PM              EEG adult  Result Date: 02/17/2020 Lora Havens, MD     02/17/2020  3:05 PM Patient Name: Belinda [name] MRN: [id number] Epilepsy Attending: Lora Havens Referring Physician/Provider: Dr Brand Males Date: 02/17/2020 Duration: 24.30 mins Patient history: 40yo F with ams in setting of sepsis and Covid. EEG to evaluate for seizure Level of alertness:  comatose AEDs during EEG study: Versed Technical aspects: This EEG study was done with scalp electrodes positioned according to the 10-20 International system of electrode placement. Electrical activity was acquired at a sampling rate of 500Hz  and reviewed with a high frequency filter of 70Hz  and a low frequency filter of 1Hz . EEG data were recorded continuously and digitally stored. Description: EEG showed continuous generalized 3 to 6 Hz theta-delta slowing admixed with an excessive amount of 15 to 18 Hz beta activity distributed symmetrically and diffusely.  Hyperventilation and photic stimulation were not performed.   ABNORMALITY -Excessive beta, generalized -Continuous slow, generalized IMPRESSION: This study is suggestive of severe diffuse encephalopathy, nonspecific etiology but likely related to sedation. No seizures or epileptiform discharges were seen throughout the recording. Lora Havens   ECHOCARDIOGRAM COMPLETE  Result Date: 02/12/2020    ECHOCARDIOGRAM REPORT   Patient Name:   NAZLI PENN Date of Exam: 02/12/2020 Medical Rec #:  [id number]        Height:       63.0 in Accession #:    [id number]       Weight:       240.5 lb Date of Birth:  [birth year]        BSA:          2.091 m Patient Age:    39 years         BP:           99/70 mmHg Patient Gender: F                HR:           54 bpm. Exam Location:  Inpatient Procedure: 2D Echo, Cardiac Doppler and Color Doppler Indications:    Acute respiratory  distress R06.03  History:        Patient has no prior history of Echocardiogram examinations.  Sonographer:    Clayton Lefort RDCS (AE) Referring Phys: [id number]  Sonographer Comments: Image acquisition challenging due to patient body habitus. IMPRESSIONS  1. Left ventricular ejection fraction, by estimation, is 55 to 60%. The left ventricle has normal function. The left ventricle has no regional wall motion abnormalities. Left ventricular diastolic parameters were normal.  2. Right ventricular systolic function is normal. The right ventricular size is normal. The estimated right ventricular systolic pressure is 66.0 mmHg.  3. The mitral valve is grossly normal. Trivial mitral valve regurgitation.  4. The aortic valve is tricuspid. Aortic valve regurgitation is not visualized.  5. The inferior vena cava is normal in size with greater than 50% respiratory variability, suggesting right atrial pressure of 3 mmHg. Conclusion(s)/Recommendation(s): Normal biventricular function without evidence of hemodynamically significant valvular heart disease. FINDINGS  Left Ventricle: Left ventricular ejection fraction, by estimation, is 55 to 60%. The left ventricle has normal function. The left ventricle has no regional wall motion abnormalities. The left ventricular internal cavity size was normal in size. There is  no left ventricular hypertrophy. Left ventricular diastolic parameters were normal. Right Ventricle: The right ventricular size is normal. No increase in right ventricular wall thickness. Right ventricular systolic function is normal. The tricuspid regurgitant velocity is 2.26 m/s, and with an assumed right atrial pressure of 3 mmHg, the estimated right ventricular systolic pressure is 63.0 mmHg. Left Atrium: Left atrial size was normal in size. Right Atrium: Right atrial size was normal in size. Pericardium: There is no evidence of pericardial effusion. Mitral Valve: The mitral valve is grossly normal.  Trivial mitral valve regurgitation. Tricuspid Valve: The tricuspid valve is grossly normal. Tricuspid valve regurgitation is trivial. Aortic Valve: The aortic valve is tricuspid. Aortic valve regurgitation is not visualized. Aortic valve mean gradient measures 3.0 mmHg. Aortic valve peak gradient measures 5.8 mmHg. Aortic valve area, by VTI measures 2.60 cm. Pulmonic Valve: The pulmonic valve was grossly normal. Pulmonic valve regurgitation is trivial. Aorta: The aortic root and ascending aorta are structurally normal, with no evidence of dilitation. Venous: The inferior vena cava is normal in size with greater than 50% respiratory variability, suggesting right atrial pressure of 3 mmHg. IAS/Shunts: No atrial level shunt detected by color flow Doppler.  LEFT VENTRICLE PLAX 2D LVIDd:         5.20 cm  Diastology LVIDs:         3.80 cm  LV e' medial:    9.90 cm/s LV PW:         1.30 cm  LV E/e' medial:  7.5 LV IVS:        1.00 cm  LV e' lateral:   10.10 cm/s LVOT diam:     2.20 cm  LV E/e' lateral: 7.4 LV SV:  67 LV SV Index:   32 LVOT Area:     3.80 cm  RIGHT VENTRICLE             IVC RV Basal diam:  2.60 cm     IVC diam: 1.10 cm RV S prime:     11.10 cm/s TAPSE (M-mode): 1.9 cm LEFT ATRIUM             Index       RIGHT ATRIUM           Index LA diam:        2.90 cm 1.39 cm/m  RA Area:     14.60 cm LA Vol (A2C):   49.8 ml 23.81 ml/m RA Volume:   35.90 ml  17.17 ml/m LA Vol (A4C):   37.8 ml 18.07 ml/m LA Biplane Vol: 46.4 ml 22.19 ml/m  AORTIC VALVE AV Area (Vmax):    2.65 cm AV Area (Vmean):   2.80 cm AV Area (VTI):     2.60 cm AV Vmax:           120.00 cm/s AV Vmean:          76.100 cm/s AV VTI:            0.259 m AV Peak Grad:      5.8 mmHg AV Mean Grad:      3.0 mmHg LVOT Vmax:         83.60 cm/s LVOT Vmean:        56.100 cm/s LVOT VTI:          0.177 m LVOT/AV VTI ratio: 0.68  AORTA Ao Root diam: 3.30 cm Ao Asc diam:  2.60 cm MITRAL VALVE               TRICUSPID VALVE MV Area (PHT): 3.21 cm     TR Peak grad:   20.4 mmHg MV Decel Time: 236 msec    TR Vmax:        226.00 cm/s MV E velocity: 74.60 cm/s MV A velocity: 40.70 cm/s  SHUNTS MV E/A ratio:  1.83        Systemic VTI:  0.18 m                            Systemic Diam: 2.20 cm Lyman Bishop MD Electronically signed by Lyman Bishop MD Signature Date/Time: 02/12/2020/6:17:29 PM    Final    CT HEAD CODE STROKE WO CONTRAST`  Result Date: 02/25/2020 CLINICAL DATA:  Code stroke.  Left-sided weakness EXAM: CT HEAD WITHOUT CONTRAST TECHNIQUE: Contiguous axial images were obtained from the base of the skull through the vertex without intravenous contrast. COMPARISON:  None. FINDINGS: Brain: There is no acute intracranial hemorrhage, mass effect, or edema. Gray-white differentiation is preserved. No extra-axial collection. Ventricles and sulci are normal in size and configuration. Vascular: No hyperdense vessel. Skull: Unremarkable. Sinuses/Orbits: Nonspecific paranasal sinus opacification primarily involving right maxillary and both sphenoid sinuses. There is thinning or dehiscence of the inferior right maxillary sinus wall. Orbits are unremarkable. Other: Minimal mastoid opacification. ASPECTS ([place] Stroke Program Early CT Score) - Ganglionic level infarction (caudate, lentiform nuclei, internal capsule, insula, M1-M3 cortex): 7 - Supraganglionic infarction (M4-M6 cortex): 3 Total score (0-10 with 10 being normal): 10 IMPRESSION: There is no acute intracranial hemorrhage or evidence of acute infarction. ASPECT score is 10. These results were communicated to Dr. Lorrin Goodell at 12:15 pm on 02/25/2020 by text page via the  [employer] messaging system. Electronically Signed   By: Macy Mis M.D.   On: 02/25/2020 12:36   VAS [place] LOWER EXTREMITY VENOUS (DVT)  Result Date: 02/18/2020  Lower Venous DVT Study Indications: Covid-19, elevated D-Dimer.  Comparison Study: No prior study Performing Technologist: Sharion Dove RVS  Examination Guidelines: A complete  evaluation includes B-mode imaging, spectral Doppler, color Doppler, and power Doppler as needed of all accessible portions of each vessel. Bilateral testing is considered an integral part of a complete examination. Limited examinations for reoccurring indications may be performed as noted. The reflux portion of the exam is performed with the patient in reverse Trendelenburg.  +---------+---------------+---------+-----------+----------+--------------+ RIGHT    CompressibilityPhasicitySpontaneityPropertiesThrombus Aging +---------+---------------+---------+-----------+----------+--------------+ CFV      Full           Yes      Yes                                 +---------+---------------+---------+-----------+----------+--------------+ SFJ      Full                                                        +---------+---------------+---------+-----------+----------+--------------+ FV Prox  Full                                                        +---------+---------------+---------+-----------+----------+--------------+ FV Mid   Full                                                        +---------+---------------+---------+-----------+----------+--------------+ FV DistalFull                                                        +---------+---------------+---------+-----------+----------+--------------+ PFV      Full                                                        +---------+---------------+---------+-----------+----------+--------------+ POP      Full           Yes      Yes                                 +---------+---------------+---------+-----------+----------+--------------+ PTV      Full                                                        +---------+---------------+---------+-----------+----------+--------------+  PERO     Full                                                         +---------+---------------+---------+-----------+----------+--------------+   +---------+---------------+---------+-----------+----------+--------------+ LEFT     CompressibilityPhasicitySpontaneityPropertiesThrombus Aging +---------+---------------+---------+-----------+----------+--------------+ CFV      Full           Yes      Yes                                 +---------+---------------+---------+-----------+----------+--------------+ SFJ      Full                                                        +---------+---------------+---------+-----------+----------+--------------+ FV Prox  Full                                                        +---------+---------------+---------+-----------+----------+--------------+ FV Mid   Full                                                        +---------+---------------+---------+-----------+----------+--------------+ FV DistalFull                                                        +---------+---------------+---------+-----------+----------+--------------+ PFV      Full                                                        +---------+---------------+---------+-----------+----------+--------------+ POP      Full           Yes      Yes                                 +---------+---------------+---------+-----------+----------+--------------+ PTV      Full                                                        +---------+---------------+---------+-----------+----------+--------------+ PERO     Full                                                        +---------+---------------+---------+-----------+----------+--------------+  Summary: BILATERAL: - No evidence of deep vein thrombosis seen in the lower extremities, bilaterally. -No evidence of popliteal cyst, bilaterally.   *See table(s) above for measurements and observations. Electronically signed by Ruta Hinds MD on 02/18/2020 at 5:24:02  PM.    Final     Microbiology: No results found for this or any previous visit (from the past 240 hour(s)).   Labs: Basic Metabolic Panel: Recent Labs  Lab 02/29/20 0029 03/01/20 0044 03/02/20 0041 03/03/20 0026 03/04/20 0013  NA 136 134* 134* 135 135  K 3.5 3.7 3.8 3.6 3.7  CL 95* 96* 98 97* 98  CO2 28 26 23 26 25   GLUCOSE 105* 128* 147* 126* 125*  BUN 28* 24* 17 16 16   CREATININE 0.75 0.67 0.70 0.60 0.61  CALCIUM 8.9 8.5* 8.3* 8.4* 8.6*  MG  --  2.2  --   --   --    Liver Function Tests: Recent Labs  Lab 02/28/20 0614 02/29/20 0029 03/01/20 0044 03/02/20 0041 03/03/20 0026  AST 62* 63* 45* 35 31  ALT 116* 109* 88* 67* 54*  ALKPHOS 100 94 88 79 76  BILITOT 0.8 1.0 0.8 0.6 0.6  PROT 7.5 7.4 6.9 6.7 6.6  ALBUMIN 3.1* 3.0* 2.7* 2.8* 2.6*   No results for input(s): LIPASE, AMYLASE in the last 168 hours. No results for input(s): AMMONIA in the last 168 hours. CBC: Recent Labs  Lab 02/29/20 0029 03/01/20 0044 03/02/20 0041 03/03/20 0026 03/04/20 0013  WBC 8.2 9.3 8.3 6.9 7.1  HGB 10.5* 10.2* 9.6* 9.8* 9.9*  HCT 33.1* 30.5* 29.5* 29.0* 31.0*  MCV 94.6 92.4 91.6 91.8 93.7  PLT 223 215 196 254 309   Cardiac Enzymes: No results for input(s): CKTOTAL, CKMB, CKMBINDEX, TROPONINI in the last 168 hours. BNP: BNP (last 3 results) No results for input(s): BNP in the last 8760 hours.  ProBNP (last 3 results) No results for input(s): PROBNP in the last 8760 hours.  CBG: Recent Labs  Lab 03/04/20 2012 03/05/20 0003 03/05/20 0415 03/05/20 0942 03/05/20 1136  GLUCAP 103* 123* 101* 117* 144*       Signed:  Domenic Polite MD.  [facility] 03/05/2020, 11:49 AM

## 2020-03-05 NOTE — IPOC Note (Signed)
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     Functional Problem List: Nursing Skin Integrity,Sensory,Safety,Perception,Endurance,Medication Management,Motor  PT Balance,Behavior,Endurance,Pain,Safety  OT Balance,Cognition,Endurance,Motor,Pain,Safety  SLP Cognition,Safety  TR         Basic ADL's: OT Grooming,Bathing,Dressing,Toileting     Advanced  ADL's: OT Simple Meal Preparation,Light Housekeeping     Transfers: PT Bed Mobility,Bed to [employer]  OT Toilet,Tub/Shower     Locomotion: PT Ambulation,Stairs     Additional Impairments: OT    SLP Swallowing,Communication      TR      Anticipated Outcomes Item Anticipated Outcome  Self Feeding    Swallowing  mod I swallow safety/precautions with PO diet   Basic self-care  mod I  Toileting  mod I   Bathroom Transfers mod I  Bowel/Bladder  remain continent of bowel/bladder with mod I  Transfers  mod I  Locomotion  supervision  Communication  mod I for speech/voice  Cognition  supervision complex level, mod I basic  Pain  no complain of pain  Safety/Judgment  able to call foe help and express need   Therapy Plan: PT Intensity: Minimum of 1-2 x/day ,45 to 90 minutes PT Frequency: 5 out of 7 days PT Duration Estimated Length of Stay: 2 weeks OT Intensity: Minimum of 1-2 x/day, 45 to 90 minutes OT Frequency: 5 out of 7 days OT Duration/Estimated Length of Stay: 2 weeks SLP Intensity: Minumum of 1-2 x/day, 30 to 90 minutes SLP Frequency: 3 to 5 out of 7 days SLP Duration/Estimated Length of Stay: 2 weeks    Team Interventions: Nursing  Interventions Patient/Family Education,Disease Management/Prevention,Skin Care/Wound Dentist  PT interventions Ambulation/gait training,Discharge planning,Functional mobility training,Psychosocial support,Therapeutic Activities,Visual/perceptual remediation/compensation,Wheelchair propulsion/positioning,Skin care/wound Academic librarian re-education,Disease management/prevention,Balance/vestibular training,Cognitive remediation/compensation,Pain Education administrator instruction,Splinting/orthotics,UE/LE Strength taining/ROM,UE/LE Coordination activities,Stair training,Patient/family Social research officer, government stimulation,Community reintegration  OT Interventions Balance/vestibular training,DME/adaptive equipment instruction,Patient/family education,Therapeutic Activities,Wheelchair propulsion/positioning,Cognitive remediation/compensation,Psychosocial support,Therapeutic Exercise,Functional mobility training,Self Care/advanced ADL retraining,UE/LE Strength taining/ROM,Discharge planning,Neuromuscular re-education,UE/LE Microbiologist  SLP Interventions Cognitive remediation/compensation,Dysphagia/aspiration precaution training,Speech/Language facilitation,Medication managment,Environmental controls,Patient/family education,Functional tasks  TR Interventions    SW/CM Interventions Discharge Planning,Psychosocial Support,Patient/Family Education   Barriers to Discharge MD  Medical stability, Trach, Weight, Behavior, Nutritional means, New oxygen and Chronic pain, substance abuse  Nursing      PT Medication compliance,Inaccessible home environment,New oxygen,Weight very anxious  OT Trach,Medication compliance    SLP Trach,Decreased caregiver support Patient reported she lives at home with her 22 y.o. son  SW Other (comments) Medicaid can not get home health  services   Team Discharge Planning: Destination: PT-Home ,OT- Home , SLP-Home Projected Follow-up: PT-Home health PT, OT-  Home health OT, SLP-Outpatient SLP,Home Health SLP,24 hour supervision/assistance Projected Equipment Needs: PT-To be determined, OT- To be determined, SLP-Other (comment) (PMV if not decannulated prior to discharge) Equipment Details: PT- , OT-  Patient/family involved in discharge planning: PT- Patient,  OT-Patient, SLP-Patient  MD ELOS: 10-14 days. Medical Rehab Prognosis:  Good Assessment: 40 year old female with history of anxiety d/o, morbid obesity, GERD who was admitted on 01/27/20 via [facility] with hypoxia, confusion and delirium wht hypotension due to Covid 19 PNA.  Patient was sedated with precedex due  to agitation, intubated and treated with Covid protocol-Remdesiver, barcitinib, Solumedrol and antibiotics. Hospital course significant for issues with agitation and combativeness (required fentanyl, Precedex, ketamine, versed)  as well as worsening of respiratory status due to ARDS requiring intubation. CT head unremarkable for acute intracranial process on 12/15 and again on 12/27.  UDS positive for benzo's and opiates. She was started on methadone on 02/16/2020 due to concerns of drug withdrawal and on 12/26 family (confirmed with patient) reported that patient had been taking her fianc's Suboxone for the past year and ( hx of accidental ibuprofen/ETOH OD in 2007--question "another substance" per mother). Hospital course further complicated by acute lower UTI.  Klebsiella UTI with sepsis treated with Unasyn.  Neurology consulted for input on refractory delirium and encephalopathy, which was felt to be multifactorial. EEG done negative for seizures and showed severe diffuse encephalopathy.  She developed ARDS with CAP v/s aspiration and underwent tracheostomy on 02/19/2020 by Dr. Tamala Julian.  She was being weaned to ATC and on 02/25/2020 found to have disconjugate gaze with question  of Left> right sided weakness.  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  QTc being monitored while on methadone with as needed Haldol. Mentation improving and scheduled hydromorphone and methadone being weaned off. Risperdal DC'd due to concerns of tremors/jerks. Xanax resumed per home regimen but she was reporting  diarrhea with attempts to wean dilaudid and increased to 1 mg tid and methadone increased to 10 mg bid on 03/04/2020 fluid overload treated with intermittent IV diuresis and now on oral lasix. Tolerating dysphagia 2 with honey liquids but continues on nocturnal tube feeds due to poor intake. PCCM following for input and recommended downsize to cuffless #6 but patient wanted to wait until today due to anxiety. Therapy has been ongoing and patient limited by decreased activity tolerance, hypoxia with activity,  weakness with flexed posture, narrow base of support with unsteadiness and LE fatigue with ambulation.  We will set goals for Mod I with PT/OT and Mod I/Supervision with SLP.  Due to the current state of emergency, patients may not be receiving their 3-hours of Medicare-mandated therapy.  See Team Conference Notes for weekly updates to the plan of care

## 2020-03-05 NOTE — Progress Notes (Addendum)
Inpatient Rehabilitation Medication Review by a Pharmacist  A complete drug regimen review was completed for this patient to identify any potential clinically significant medication issues.  Clinically significant medication issues were identified:  yes   Type of Medication Issue Identified Description of Issue Urgent (address now) Non-Urgent (address on AM team rounds) Plan Plan Accepted by Provider? (Yes / No / Pending AM Rounds)  Drug Interaction(s) (clinically significant)       Duplicate Therapy       Allergy       No Medication Administration End Date       Incorrect Dose       Additional Drug Therapy Needed  Dr. Broadus John started Metoprolol tartrate 12.5 mg PO BID on 03/05/20.   Not yet resumed on admission to CIR. Dilaudid 68m BID moderate to severe pain not resumed. (last dose given on 03/04/20 @ 03:58).  Urgent: metoprolol tartrate and dilaudid.  Nonurgent:   Linaclotide (Linzess),  fluticasone inhaler and fluticasone nasa spray- [facility]  [facility].     Yes,  PReesa Chew PA-C gave order to resume Metoprolol tartrate 12.5 mg PO BID and Dilaudid 197mpo BID prn moderate to severe pain.  Other         Name of provider notified for urgent issues identified:  Dr. AnJamse Arnnd PaReesa ChewPA-C  Provider Method of Notification:  secure chat message sent   For non-urgent medication issues to be resolved on team rounds tomorrow morning a [facility] Secure Chat Handoff was sent to:    Linaclotide (Linzess),  fluticasone inhaler and fluticasone nasa spray  were recommended to resume on discharge by Dr. JoBroadus John  Not resumed on CIR admission.    Pharmacist comments:  Dr. JoBroadus Johntarted Metoprolol 12.5 mg PO BID on 03/05/20.    Not yet resumed on admission to CIR. Dilaudid 28m2mID moderate to severe pain not resumed. (last dose given on 03/04/20 @ 03:58).   Time spent performing this drug regimen review (minutes):  15 minutes   RutNicole CellaPhNorth Carolinainical  Pharmacist 03/05/2020 5:25 PM

## 2020-03-05 NOTE — Progress Notes (Signed)
Patients trach changed out for a #6 shiley uncuffed. Color change noted on CO2 detector noted. BBS heard. Patient tolerated well.

## 2020-03-05 NOTE — Progress Notes (Signed)
Inpatient Rehab Admissions Coordinator:   I have a CIR bed for this Pt. Today and will plan to admit. Trach changes can be completed on CIR and are not a barrier to CIR admit.  RN may call report to [phone].  Clemens Catholic, [place], [place] Admissions Coordinator  [phone] ([place]) [phone] (office)

## 2020-03-05 NOTE — Plan of Care (Signed)
  Problem: Education: Goal: Knowledge of General Education information will improve Description: Including pain rating scale, medication(s)/side effects and non-pharmacologic comfort measures Outcome: Adequate for Discharge   Problem: Health Behavior/Discharge Planning: Goal: Ability to manage health-related needs will improve Outcome: Adequate for Discharge   Problem: Clinical Measurements: Goal: Ability to maintain clinical measurements within normal limits will improve Outcome: Adequate for Discharge Goal: Will remain free from infection Outcome: Adequate for Discharge Goal: Diagnostic test results will improve Outcome: Adequate for Discharge Goal: Respiratory complications will improve Outcome: Adequate for Discharge Goal: Cardiovascular complication will be avoided Outcome: Adequate for Discharge   Problem: Activity: Goal: Risk for activity intolerance will decrease Outcome: Adequate for Discharge   Problem: Nutrition: Goal: Adequate nutrition will be maintained Outcome: Adequate for Discharge   Problem: Coping: Goal: Level of anxiety will decrease Outcome: Adequate for Discharge   Problem: Elimination: Goal: Will not experience complications related to bowel motility Outcome: Adequate for Discharge Goal: Will not experience complications related to urinary retention Outcome: Adequate for Discharge   Problem: Pain Managment: Goal: General experience of comfort will improve Outcome: Adequate for Discharge   Problem: Safety: Goal: Ability to remain free from injury will improve Outcome: Adequate for Discharge   Problem: Skin Integrity: Goal: Risk for impaired skin integrity will decrease Outcome: Adequate for Discharge   Problem: Education: Goal: Knowledge of risk factors and measures for prevention of condition will improve Outcome: Adequate for Discharge   Problem: Coping: Goal: Psychosocial and spiritual needs will be supported Outcome: Adequate for  Discharge   Problem: Respiratory: Goal: Will maintain a patent airway Outcome: Adequate for Discharge Goal: Complications related to the disease process, condition or treatment will be avoided or minimized Outcome: Adequate for Discharge   Problem: Safety: Goal: Non-violent Restraint(s) Outcome: Adequate for Discharge

## 2020-03-06 ENCOUNTER — Inpatient Hospital Stay (HOSPITAL_COMMUNITY): Payer: Medicaid Other | Admitting: Occupational Therapy

## 2020-03-06 ENCOUNTER — Inpatient Hospital Stay (HOSPITAL_COMMUNITY): Payer: Medicaid Other | Admitting: Speech Pathology

## 2020-03-06 ENCOUNTER — Inpatient Hospital Stay (HOSPITAL_COMMUNITY): Payer: Medicaid Other | Admitting: Physical Therapy

## 2020-03-06 DIAGNOSIS — R5381 Other malaise: Secondary | ICD-10-CM

## 2020-03-06 DIAGNOSIS — Z930 Tracheostomy status: Secondary | ICD-10-CM

## 2020-03-06 DIAGNOSIS — F1910 Other psychoactive substance abuse, uncomplicated: Secondary | ICD-10-CM

## 2020-03-06 DIAGNOSIS — R1310 Dysphagia, unspecified: Secondary | ICD-10-CM

## 2020-03-06 DIAGNOSIS — D62 Acute posthemorrhagic anemia: Secondary | ICD-10-CM

## 2020-03-06 DIAGNOSIS — R739 Hyperglycemia, unspecified: Secondary | ICD-10-CM

## 2020-03-06 DIAGNOSIS — G7281 Critical illness myopathy: Principal | ICD-10-CM

## 2020-03-06 DIAGNOSIS — F064 Anxiety disorder due to known physiological condition: Secondary | ICD-10-CM

## 2020-03-06 DIAGNOSIS — G894 Chronic pain syndrome: Secondary | ICD-10-CM

## 2020-03-06 DIAGNOSIS — R7401 Elevation of levels of liver transaminase levels: Secondary | ICD-10-CM

## 2020-03-06 LAB — COMPREHENSIVE METABOLIC PANEL
ALT: 45 U/L — ABNORMAL HIGH (ref 0–44)
AST: 23 U/L (ref 15–41)
Albumin: 2.9 g/dL — ABNORMAL LOW (ref 3.5–5.0)
Alkaline Phosphatase: 79 U/L (ref 38–126)
Anion gap: 12 (ref 5–15)
BUN: 10 mg/dL (ref 6–20)
CO2: 24 mmol/L (ref 22–32)
Calcium: 9.1 mg/dL (ref 8.9–10.3)
Chloride: 101 mmol/L (ref 98–111)
Creatinine, Ser: 0.63 mg/dL (ref 0.44–1.00)
GFR, Estimated: >60 mL/min (ref >60)
Glucose, Bld: 100 mg/dL — ABNORMAL HIGH (ref 70–99)
Potassium: 3.8 mmol/L (ref 3.5–5.1)
Sodium: 137 mmol/L (ref 135–145)
Total Bilirubin: 0.5 mg/dL (ref 0.3–1.2)
Total Protein: 6.9 g/dL (ref 6.5–8.1)

## 2020-03-06 LAB — CBC WITH DIFFERENTIAL/PLATELET
Abs Immature Granulocytes: 0.02 10*3/uL (ref 0.00–0.07)
Basophils Absolute: 0 10*3/uL (ref 0.0–0.1)
Basophils Relative: 0 %
Eosinophils Absolute: 0.2 10*3/uL (ref 0.0–0.5)
Eosinophils Relative: 3 %
HCT: 32.6 % — ABNORMAL LOW (ref 36.0–46.0)
Hemoglobin: 10.4 g/dL — ABNORMAL LOW (ref 12.0–15.0)
Immature Granulocytes: 0 %
Lymphocytes Relative: 47 %
Lymphs Abs: 3.1 10*3/uL (ref 0.7–4.0)
MCH: 30 pg (ref 26.0–34.0)
MCHC: 31.9 g/dL (ref 30.0–36.0)
MCV: 93.9 fL (ref 80.0–100.0)
Monocytes Absolute: 0.7 10*3/uL (ref 0.1–1.0)
Monocytes Relative: 10 %
Neutro Abs: 2.7 10*3/uL (ref 1.7–7.7)
Neutrophils Relative %: 40 %
Platelets: 323 10*3/uL (ref 150–400)
RBC: 3.47 MIL/uL — ABNORMAL LOW (ref 3.87–5.11)
RDW: 14.1 % (ref 11.5–15.5)
WBC: 6.7 10*3/uL (ref 4.0–10.5)
nRBC: 0 % (ref 0.0–0.2)

## 2020-03-06 LAB — GLUCOSE, CAPILLARY
Glucose-Capillary: 112 mg/dL — ABNORMAL HIGH (ref 70–99)
Glucose-Capillary: 117 mg/dL — ABNORMAL HIGH (ref 70–99)
Glucose-Capillary: 127 mg/dL — ABNORMAL HIGH (ref 70–99)
Glucose-Capillary: 75 mg/dL (ref 70–99)
Glucose-Capillary: 86 mg/dL (ref 70–99)
Glucose-Capillary: 98 mg/dL (ref 70–99)

## 2020-03-06 MED ORDER — TRAMADOL HCL 50 MG PO TABS
50.0000 mg | ORAL_TABLET | Freq: Four times a day (QID) | ORAL | Status: DC | PRN
  Administered 2020-03-06 – 2020-03-16 (×35): 50 mg via ORAL
  Filled 2020-03-06 (×39): qty 1

## 2020-03-06 MED ORDER — VITAL 1.5 CAL PO LIQD
750.0000 mL | ORAL | Status: DC
  Administered 2020-03-06: 750 mL
  Filled 2020-03-06: qty 948

## 2020-03-06 MED ORDER — FREE WATER
200.0000 mL | Freq: Three times a day (TID) | Status: DC
  Administered 2020-03-06 – 2020-03-11 (×16): 200 mL

## 2020-03-06 MED ORDER — MAGIC MOUTHWASH W/LIDOCAINE
10.0000 mL | Freq: Four times a day (QID) | ORAL | Status: DC
  Filled 2020-03-06 (×41): qty 10

## 2020-03-06 MED ORDER — ALPRAZOLAM 0.5 MG PO TABS
1.0000 mg | ORAL_TABLET | Freq: Three times a day (TID) | ORAL | Status: DC | PRN
  Administered 2020-03-06 – 2020-03-18 (×35): 1 mg via ORAL
  Filled 2020-03-06 (×36): qty 2

## 2020-03-06 MED ORDER — HYDROMORPHONE HCL 2 MG PO TABS
1.0000 mg | ORAL_TABLET | Freq: Two times a day (BID) | ORAL | Status: DC | PRN
  Administered 2020-03-06: 1 mg via ORAL
  Filled 2020-03-06 (×3): qty 1

## 2020-03-06 NOTE — Progress Notes (Signed)
[facility] Individual Statement of Services  Patient Name:  Belinda [name]  Date:  03/06/2020  Welcome to the [facility].  Our goal is to provide you with an individualized program based on your diagnosis and situation, designed to meet your specific needs.  With this comprehensive rehabilitation program, you will be expected to participate in at least 3 hours of rehabilitation therapies Monday-Friday, with modified therapy programming on the weekends.  Your rehabilitation program will include the following services:  Physical Therapy (PT), Occupational Therapy (OT), Speech Therapy (ST), 24 hour per day rehabilitation nursing, Therapeutic Recreaction (TR), Neuropsychology, Care Coordinator, Rehabilitation Medicine, Nutrition Services and Pharmacy Services  Weekly team conferences will be held on Wednesday to discuss your progress.  Your Inpatient Rehabilitation Care Coordinator will talk with you frequently to get your input and to update you on team discussions.  Team conferences with you and your family in attendance may also be held.  Expected length of stay: 12-14 days  Overall anticipated outcome: independent to supervision with cues  Depending on your progress and recovery, your program may change. Your Inpatient Rehabilitation Care Coordinator will coordinate services and will keep you informed of any changes. Your Inpatient Rehabilitation Care Coordinator's name and contact numbers are listed  below.  The following services may also be recommended but are not provided by the [facility] will be made to provide these services after discharge if needed.  Arrangements include referral to agencies that provide these services.  Your insurance has been verified to be:  Medicaid Your primary doctor is:  Development worker, international aid  Pertinent information will be shared with your doctor and your insurance company.  Inpatient Rehabilitation Care Coordinator:  Ovidio Kin, [place] or Emilia Beck  Information discussed with and copy given to patient by: Elease Hashimoto, 03/06/2020, 12:46 PM

## 2020-03-06 NOTE — Progress Notes (Signed)
Inpatient Rehabilitation Care Coordinator Assessment and Plan Patient Details  Name: Belinda [name] MRN: [id number] Date of Birth: 1980-07-12  Today's Date: 03/06/2020  Hospital Problems: Principal Problem:   Critical illness myopathy Active Problems:   Debility  Past Medical History:  Past Medical History:  Diagnosis Date  . Anxiety    compulsive worring, phobia  . Anxiety   . Anxiety disorder   . Asthma   . Bipolar affective ([place])   . Fatty liver 11/14/2018  . Fatty liver   . GAD (generalized anxiety disorder)    with panic attacks  . GERD (gastroesophageal reflux disease)   . Morbid obesity ([place])   . Narcotic addiction ([place])   . Narcotic drug use   . OCD (obsessive compulsive disorder)   . Psoriasis   . Reactive airways dysfunction syndrome ([place])   . Vaginal Pap smear, abnormal    Past Surgical History:  Past Surgical History:  Procedure Laterality Date  . CESAREAN SECTION     twice  . COLPOSCOPY    . DILATION AND CURETTAGE OF UTERUS    . ESOPHAGOGASTRODUODENOSCOPY  07/2018  . ESOPHAGOGASTRODUODENOSCOPY (EGD) WITH PROPOFOL N/A 08/03/2018   Procedure: ESOPHAGOGASTRODUODENOSCOPY (EGD) WITH PROPOFOL;  Surgeon: Daneil Dolin, MD;  Location: [facility];  Service: Endoscopy;  Laterality: N/A;  11:00am  . PID     Social History:  reports that she has been smoking cigarettes. She has been smoking about 0.50 packs per day. She has never used smokeless tobacco. She reports that she does not drink alcohol and does not use drugs.  Family / Support Systems Marital Status: Separated Patient Roles: Partner,Parent Spouse/Significant Other: Lennette Bihari [phone] Children: 41 and 84 yo son's Other Supports: Diane Hollinghead-Mom [phone] Anticipated Caregiver: Diane Ability/Limitations of Caregiver: Can assist doesn't work-in good health Caregiver Availability: 24/7 Family Dynamics: Close knit with both son's and parents. She has friends who have checked on her also  since being in the hospital. 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 Mom to assist also will be included   Abuse/Neglect Abuse/Neglect Assessment Can Be Completed: Yes Physical Abuse: Denies Verbal Abuse: Denies Sexual Abuse: Denies Exploitation of patient/patient's resources: Denies Self-Neglect: Denies  Emotional Status Pt's affect, behavior and adjustment status: Pt expresses she has always been independent and taken care of herself and wants to get back to this. Mom reports she wants to do for herself and not ask for help from others. Recent Psychosocial Issues: other health issues has a PCP and feels he manages her other health issues Psychiatric History: History of OCD and anxiety takes medications for this and finds it helpful. Feels more anxious now with her trach and NG tube they make her nervous. Will ask neruo-psych to see while here. Needs to address substance abuse issues also. Will need to see pt alone to ask about since Mom is present Substance Abuse History: According to chart was taking fiance's suboxone prior to admission can not address with Mom in the room. Will address when can meet with pt alone.  Patient / Family Perceptions, Expectations & Goals Pt/Family understanding of illness & functional limitations: Pt and Mom  can explain her COVID and issues as result of catching it. She does talk with the MD's involved and feels she has a good understanding of her plan going forward. She hopes to have her NG tube out soon it bothers her. Premorbid  pt/family roles/activities: Mom, grandmother, home maker, friend, daughter, etc Anticipated changes in roles/activities/participation: resume Pt/family expectations/goals: Pt states: " I want to get back to my independent level,and both of these tubes out."  Mom states: " We are happy with how well she is doing now."  [employer]: None Premorbid Home Care/DME Agencies: None Transportation available at discharge: Self, will rely upon family until she can drive again Resource referrals recommended: Neuropsychology  Discharge Planning Living Arrangements: Children,Spouse/significant other Support Systems: Spouse/significant other,Children,Parent,Other relatives,Friends/neighbors Type of Residence: Private residence Insurance Resources: [employer] (specify county) Museum/gallery curator Resources: Family Support Financial Screen Referred: No Living Expenses: Education officer, community Management: Significant Other Does the patient have any problems obtaining your medications?: No Home Management: Self Patient/Family Preliminary Plans: Return to her home with fiance and her 56 yo son. Mom will come in and assist her. Fiance is a long distance truck driver and only home 2x month. Son goes to [employer] in [place]. Team evaluating her and setting goals. Will work on discharge plans. Care Coordinator Barriers to Discharge: Other (comments) Care Coordinator Barriers to Discharge Comments: Medicaid can not get home health services Care Coordinator Anticipated Follow Up Needs: HH/OP  Clinical Impression Pleasant but anxious female who is willing to work hard in therapies to recover from [place]. Her Mom is present in the room and supportive, she will be assisting daughter at discharge. Will address substance abuse issues when can evaluate pt alone. Have placed on neuro-psych list to be seen for coping and substance issues.  Elease Hashimoto 03/06/2020, 12:44 PM

## 2020-03-06 NOTE — Evaluation (Addendum)
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 Anxiety    compulsive worring, phobia  . Anxiety   . Anxiety disorder   . Asthma   . Bipolar affective ([place])   . Fatty liver 11/14/2018  . Fatty liver   . GAD (generalized anxiety disorder)    with panic attacks  . GERD (gastroesophageal reflux disease)   . Morbid obesity ([place])   . Narcotic addiction ([place])   . Narcotic drug use   . OCD (obsessive compulsive disorder)   . Psoriasis   . Reactive airways dysfunction syndrome ([place])   . Vaginal Pap smear, abnormal    Past Surgical History:  Past Surgical History:  Procedure Laterality Date  . CESAREAN SECTION     twice  . COLPOSCOPY    . DILATION AND CURETTAGE OF UTERUS    . ESOPHAGOGASTRODUODENOSCOPY  07/2018  . ESOPHAGOGASTRODUODENOSCOPY (EGD) WITH PROPOFOL N/A 08/03/2018   Procedure: ESOPHAGOGASTRODUODENOSCOPY (EGD) WITH PROPOFOL;  Surgeon: Daneil Dolin, MD;  Location: [facility];  Service: Endoscopy;  Laterality: N/A;  11:00am  . PID      Assessment & Plan Clinical Impression:  Belinda [name]. Belinda [name] is a 40 year old female with history of anxiety d/o, morbid obesity, GERD who was admitted on 01/27/20 via [facility] with hypoxia, confusion and delirium wht hypotension due to Covid 19 PNA.  History taken from chart review and patient due to  cognition.  Patient was sedated with precedex due to agitation, intubated and treated with Covid protocol-Remdesiver, barcitinib, Solumedrol and antibiotics. Hospital course significant for issues with agitation and combativeness (required fentanyl, Precedex, ketamine, versed)  as well as worsening of respiratory status due to ARDS requiring intubation. CT head unremarkable for acute intracranial process on 12/15 and again on 12/27.  UDS positive for benzo's and opiates. She was started on methadone on 02/16/2020 due to concerns of drug withdrawal and on 12/26 family (confirmed with patient) reported that patient had been taking her fianc's Suboxone for the past year and ( hx of accidental ibuprofen/ETOH OD in 2007--question "another substance" per mother)  Hospital course further complicated by acute lower UTI.  Klebsiella UTI with sepsis treated with Unasyn.  Neurology consulted for input on refractory delirium and encephalopathy, which was felt to be multifactorial. EEG done negative for seizures and showed severe diffuse encephalopathy.  She developed ARDS with CAP v/s aspiration and underwent tracheostomy on 02/19/2020 by Dr. Tamala Julian.  She was being weaned to ATC and on 02/25/2020 found to have disconjugate gaze with question of Left> right sided weakness.  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  QTc being monitored while on methadone with as needed Haldol.  Mentation improving and scheduled hydromorphone and methadone being weaned off. Risperdal DC'd due to concerns of tremors/jerks. Xanax resumed per home regimen but she was reporting  diarrhea with attempts to wean dilaudid and increased to 1 mg tid and methadone increased to 10 mg bid on 03/04/2020 fluid  overload treated with intermittent IV diuresis and now on oral lasix. Tolerating dysphagia 2 with honey liquids but continues on nocturnal tube feeds due to poor intake. PCCM following for input and recommended downsize to cuffless #6 but patient wanted to wait until today due to anxiety. Therapy has been ongoing and patient limited by decreased activity tolerance, hypoxia with activity,  weakness with flexed posture, narrow base of support with unsteadiness and LE fatigue with ambulation.  Patient transferred to CIR on 03/05/2020 .    Patient currently requires min with basic self-care skills secondary to muscle weakness, decreased cardiorespiratoy endurance, decreased coordination, decreased problem solving and decreased safety awareness and decreased sitting balance, decreased standing balance and decreased balance strategies.  Prior to hospitalization, patient could complete ADLs and IADLs with independent .  Patient will benefit from skilled intervention to increase independence with basic self-care skills prior to discharge home independently.  Anticipate patient will require intermittent supervision and follow up home health.  OT - End of Session Activity Tolerance: Tolerates 30+ min activity with multiple rests Endurance Deficit: Yes Endurance Deficit Description: pt requires intermittent RBs during sinkside self care OT Assessment Rehab Potential (ACUTE ONLY): Good OT Barriers to Discharge: Trach;Medication compliance OT Patient demonstrates impairments in the following area(s): Balance;Cognition;Endurance;Motor;Pain;Safety OT Basic ADL's Functional Problem(s): Grooming;Bathing;Dressing;Toileting OT Advanced ADL's Functional Problem(s): Simple Meal Preparation;Light Housekeeping OT Transfers Functional Problem(s): Toilet;Tub/Shower OT Plan OT Intensity: Minimum of 1-2 x/day, 45 to 90 minutes OT Frequency: 5 out of 7 days OT Duration/Estimated Length of Stay: 2 weeks OT  Treatment/Interventions: Balance/vestibular training;DME/adaptive equipment instruction;Patient/family education;Therapeutic Activities;Wheelchair propulsion/positioning;Cognitive remediation/compensation;Psychosocial support;Therapeutic Exercise;Functional mobility training;Self Care/advanced ADL retraining;UE/LE Strength taining/ROM;Discharge planning;Neuromuscular re-education;UE/LE Coordination activities;Disease mangement/prevention;Pain management OT Basic Self-Care Anticipated Outcome(s): mod I OT Toileting Anticipated Outcome(s): mod I OT Bathroom Transfers Anticipated Outcome(s): mod I OT Recommendation Recommendations for Other Services: Therapeutic Recreation consult Therapeutic Recreation Interventions: Stress management Patient destination: Home Follow Up Recommendations: Home health OT Equipment Recommended: To be determined   OT Evaluation Precautions/Restrictions  Precautions Precautions: Fall Precaution Comments: trach, cortrak, watch HR, monitor BP Restrictions Weight Bearing Restrictions: No General Chart Reviewed: Yes Pain Pain Assessment Pain Scale: 0-10 Pain Score: 5  Pain Type: Acute pain Pain Location: Throat Pain Orientation: Anterior Pain Descriptors / Indicators: Aching;Discomfort;Sore Pain Onset: On-going Pain Intervention(s): RN made aware Home Living/Prior [place] expects to be discharged to:: Private residence Living Arrangements: Children,Spouse/significant other Available Help at Discharge: Family,Available 24 hours/day (Simultaneous filing. User may not have seen previous data.) Type of Home: Apartment (Simultaneous filing. User may not have seen previous data.) Home Access: Level entry Entrance Stairs-Rails: None Home Layout: One level Bathroom Shower/Tub: [employer] Accessibility: Yes  Lives With: Son (Simultaneous filing. 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 User may not have seen previous data.) Arousal/Alertness: Awake/alert (Simultaneous filing. User may not have seen previous data.) Orientation Level: Person;Place;Situation Person: Oriented Place: Oriented Situation: Oriented Year: 2022 Month: January Day of Week: Correct Memory: Impaired (Simultaneous filing. User may not have seen previous data.) Memory Impairment: Decreased recall of new information;Retrieval deficit Immediate Memory Recall: Sheila Oats, Bed Memory Recall Sock: Without Cue Memory Recall Blue: Without Cue Memory Recall Bed: Without Cue Attention: Sustained (Simultaneous filing. User may not have seen previous data.) Sustained Attention: Impaired (Simultaneous filing. User may not have seen previous data.) Sustained Attention Impairment: Verbal basic;Verbal complex Awareness: Appears intact Problem Solving: Appears intact Executive Function: Sequencing;Reasoning Reasoning: Impaired Reasoning Impairment: Verbal basic;Functional basic Sequencing: Impaired Sequencing Impairment: Verbal basic;Functional basic Safety/Judgment: Impaired (Simultaneous filing. User may not have seen previous data.) Sensation Sensation Light Touch: Appears Intact Hot/Cold: Appears Intact Proprioception: Appears Intact Stereognosis: Not tested Coordination Gross Motor  Movements are Fluid and Coordinated: Yes Fine Motor Movements are Fluid and Coordinated: No (tremor bilateral hands) Motor  Motor Motor: Within Functional Limits  Trunk/Postural Assessment  Cervical Assessment Cervical Assessment: Exceptions to [facility] (forward head) Thoracic Assessment Thoracic Assessment: Exceptions to [facility] (rounded shoulders) Lumbar Assessment Lumbar Assessment: Exceptions to [facility] (mild posterior pelvic tilt) Postural Control Postural Control: Within Functional Limits  Balance Balance Balance Assessed: Yes Static Sitting Balance Static Sitting - Level of Assistance: 5: Stand by assistance Dynamic Sitting Balance Dynamic Sitting - Level of Assistance: 5: Stand by assistance Static Standing Balance Static Standing - Level of Assistance: 5: Stand by assistance Dynamic Standing Balance Dynamic Standing - Level of Assistance: 4: Min assist Extremity/Trunk Assessment RUE Assessment RUE Assessment: Exceptions to [facility] General Strength Comments: 3+/5 RUE LUE Assessment LUE Assessment: Exceptions to [facility] General Strength Comments: 3+/5 LUE  Care Tool Care Tool Self Care Eating   Eating Assist Level: Set up assist    Oral Care    Oral Care Assist Level: Set up assist    Bathing   Body parts bathed by patient: Right arm;Left arm;Chest;Abdomen;Front perineal area;Buttocks;Right upper leg;Left upper leg;Right lower leg;Left lower leg;Face     Assist Level: Contact Guard/Touching assist    Upper Body Dressing(including orthotics)   What is the patient wearing?: Hospital gown only   Assist Level: Minimal Assistance - Patient > 75%    Lower Body Dressing (excluding footwear)   What is the patient wearing?: Underwear/pull up Assist for lower body dressing: Contact Guard/Touching assist    Putting on/Taking off footwear   What is the patient wearing?: Non-skid slipper socks Assist for footwear: Minimal Assistance - Patient > 75%       Care Tool  Toileting Toileting activity   Assist for toileting: Contact Guard/Touching assist     Care Tool Bed Mobility Roll left and right activity   Roll left and right assist level: Contact Guard/Touching assist    Sit to lying activity   Sit to lying assist level: Minimal Assistance - Patient > 75%    Lying to sitting edge of bed activity   Lying to sitting edge of bed assist level: Minimal Assistance - Patient > 75%     Care Tool Transfers Sit to stand transfer   Sit to stand assist level: Minimal Assistance - Patient > 75%    Chair/bed transfer   Chair/bed transfer assist level: Minimal Assistance - Patient > 75%     Toilet transfer   Assist Level: Minimal Assistance - Patient > 75%  Care Tool Cognition Expression of Ideas and Wants Expression of Ideas and Wants: Without difficulty (complex and basic) - expresses complex messages without difficulty and with speech that is clear and easy to understand   Understanding Verbal and Non-Verbal Content Understanding Verbal and Non-Verbal Content: Understands (complex and basic) - clear comprehension without cues or repetitions   Memory/Recall Ability *first 3 days only Memory/Recall Ability *first 3 days only: Current season;That he or she is in a hospital/hospital unit    Refer to Care Plan for [place] OT Short Term Goal 1 (Week 1): Pt will complete toileting with supervision OT Short Term Goal 2 (Week 1): Pt will complete UB/LB bathing with supervision OT Short Term Goal 3 (Week 1): Pt will complete toilet transfer with supervision OT Short Term Goal 4 (Week 1): Pt will complete UB/LB dressing with supervision.  Recommendations for other services: Therapeutic Recreation  Stress management   Skilled Therapeutic Intervention ADL ADL Grooming: Setup Where Assessed-Grooming: Sitting at sink Upper Body Bathing: Supervision/safety Where Assessed-Upper Body Bathing: Sitting at sink Lower Body  Bathing: Contact guard Where Assessed-Lower Body Bathing: Sitting at sink;Standing at sink Upper Body Dressing: Minimal assistance Where Assessed-Upper Body Dressing: Sitting at sink Lower Body Dressing: Contact guard Where Assessed-Lower Body Dressing: Sitting at sink;Standing at sink Toileting: Contact guard Where Assessed-Toileting: Bedside Commode Toilet Transfer: Contact guard Toilet Transfer Method: Stand pivot Toilet Transfer Equipment: Bedside commode Mobility  Bed Mobility Bed Mobility: Rolling Right;Rolling Left;Supine to Sit;Sit to Supine;Sitting - Scoot to [employer] of Bed Rolling Right: Supervision/verbal cueing Rolling Left: Supervision/Verbal cueing Supine to Sit: Contact Guard/Touching assist Sit to Supine: Contact Guard/Touching assist Transfers Sit to Stand: Contact Guard/Touching assist   Skilled Intervention:  Pt sidelying in bed, PMV donned,  reports wanting to sit EOB and feeling like she "might throw up".  OT provided pt with emesis bag and pt completed sidelying to sit with CGA.  Initial evaluation and collaboration regarding OT POC completed.  Pt completed functional mobility and self care per above levels of assist needed.  Pt requesting to return to bed at end of session.  Call bell in reach and suction in reach, bed alarm on.   Discharge Criteria: Patient will be discharged from OT if patient refuses treatment 3 consecutive times without medical reason, if treatment goals not met, if there is a change in medical status, if patient makes no progress towards goals or if patient is discharged from hospital.  The above assessment, treatment plan, treatment alternatives and goals were discussed and mutually agreed upon: by patient  Ezekiel Slocumb 03/06/2020, 4:16 PM

## 2020-03-06 NOTE — Evaluation (Signed)
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 Anxiety    compulsive worring, phobia  . Anxiety   . Anxiety disorder   . Asthma   . Bipolar affective ([place])   . Fatty liver 11/14/2018  . Fatty liver   . GAD (generalized anxiety disorder)    with panic attacks  . GERD (gastroesophageal reflux disease)   . Morbid obesity ([place])   . Narcotic addiction ([place])   . Narcotic drug use   . OCD (obsessive compulsive disorder)   . Psoriasis   . Reactive airways dysfunction syndrome ([place])   . Vaginal Pap smear, abnormal    Past Surgical History:  Past Surgical History:  Procedure Laterality Date  . CESAREAN SECTION     twice  . COLPOSCOPY    . DILATION AND CURETTAGE OF UTERUS    . ESOPHAGOGASTRODUODENOSCOPY  07/2018  . ESOPHAGOGASTRODUODENOSCOPY (EGD) WITH PROPOFOL N/A 08/03/2018   Procedure: ESOPHAGOGASTRODUODENOSCOPY (EGD) WITH PROPOFOL;  Surgeon: Daneil Dolin, MD;  Location: [facility];  Service: Endoscopy;  Laterality: N/A;  11:00am  . PID      Assessment / Plan / Recommendation  Patient is a 40 y.o. year old female withhistory of anxiety d/o, morbid obesity, GERD who was admitted on 01/27/20 via [facility] with hypoxia, confusion and delirium wht hypotension due to Covid 19 PNA. History taken from chart review and patient due to cognition. Patient was sedated with precedex  due to agitation, intubated and treated with Covid protocol-Remdesiver, barcitinib, Solumedrol and antibiotics. Hospital course significant for issues with agitation and combativeness (required fentanyl, Precedex, ketamine, versed) as well as worsening of respiratory status due to ARDS requiring intubation. CT head unremarkable for acute intracranial process on 12/15 and again on 12/27. UDS positive for benzo's and opiates. She was started on methadone on 02/16/2020 due to concerns of drug withdrawal and on 12/26 family (confirmed with patient) reported that patient had been taking her fianc's Suboxone for the past year and ( hx of accidental ibuprofen/ETOH OD in 2007--question "another substance" per mother)  Hospital course further complicated by acute lower UTI. Klebsiella UTI with sepsis treated with Unasyn. Neurology consulted for input on refractory delirium and encephalopathy, which was felt to be multifactorial. EEG done negative for seizures and showed severe diffuse encephalopathy. She developed ARDS with CAP v/s aspiration and underwent tracheostomy on 02/19/2020 by Dr. Tamala Julian. She was being weaned to [facility] and on 02/25/2020 found to have disconjugate gaze with question of Left>right sided weakness. 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 QTc being monitored while on methadone with as needed Haldol.  Mentation improving and scheduled hydromorphone and methadone being weaned off. Risperdal DC'd due to concerns of tremors/jerks. Xanax resumed per home regimen but  she was reporting diarrhea with attempts to wean dilaudid and increased to 1 mg tid and methadone increased to 10 mg bid on 03/04/2020 fluid overload treated with intermittent IV diuresis and  now on oral lasix. Tolerating dysphagia 2 with honey liquids but continues on nocturnal tube feeds due to poor intake. PCCM following for input and recommended downsize to cuffless #6 but patient wanted to wait until today due to anxiety. Therapy has been ongoing and patient limited by decreased activity tolerance, hypoxia with activity, weakness with flexed posture, narrow base of support with unsteadiness and LE fatigue with ambulation. CIR recommended due to functional deficits in mobility and ADLs. Please see preadmission assessment from earlier today as well. Patient transferred to CIR on 03/05/2020 .   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 She was very anxious during evaluation and stated that she has h/o anxiety but that it is much worse since waking up in hospital during this current admission. SLP did not completed full cognitive assessment secondary to patient's anxiety. She would frequently state that she feels she would do better when her pain medications were working better. She had her trach changed yesterday from a #6 cuffed to a #6 cuffless and patient stated her throat is sore where they had to change her inner cannula. Patient's voice was clear but low vocal intensity with PMV in place. She was able to cough but not demonstrate expectoration of secretions orally, though none were observed trachaeally either. Patient exhibits impairments in short term memory, problem solving, reasoning, as well as voice impairment. She presents with a mild-moderate pharyngeal dysphagia with throat clearing and coughing noted with honey thick liquids. (unable to definitively determine if related to secretions versus PO's). MBS was completed 1/5 and recommendation at that time was for repeat MBS when Cortrak feeding tube had been removed. Unfortunately, patient has not been consuming enough  nutrition via oral intake at this time. Patiet will benefit from skilled SLP intevention to maximixe cognition, speech, voice and swallow function.  Skilled Therapeutic Interventions          Speech-language, voice, swallow evaluation  SLP Assessment  Patient will need skilled [facility] during CIR admission    Recommendations  Patient may use Passy-Muir Speech Valve: During all therapies with supervision;Intermittently with supervision PMSV Supervision: Full MD: Please consider changing trach tube to : Smaller size SLP Diet Recommendations: Dysphagia 2 (Fine chop);Honey Liquid Administration via: Cup;Straw Medication Administration: Whole meds with puree Supervision: Patient able to self feed Compensations: Slow rate;Small sips/bites Postural Changes and/or Swallow Maneuvers: Seated upright 90 degrees Oral Care Recommendations: Oral care BID Recommendations for Other Services: Neuropsych consult (Patient presents with high anxiety which she said is significantly worse than her baseline) Patient destination: Home Follow up Recommendations: Outpatient SLP;Home Health SLP;24 hour supervision/assistance Equipment Recommended: Other (comment) (PMV if not decannulated prior to discharge)    SLP Frequency 3 to 5 out of 7 days   SLP Duration  SLP Intensity  SLP Treatment/Interventions 2 weeks  Minumum of 1-2 x/day, 30 to 90 minutes  Cognitive remediation/compensation;Dysphagia/aspiration precaution training;Speech/Language facilitation;Medication managment;Environmental controls;Patient/family education;Functional tasks    Pain Pain Assessment Pain Scale: 0-10 Pain Score: 5  Pain Type: Acute pain Pain Location: Throat Pain Orientation: Anterior Pain Descriptors / Indicators: Aching;Discomfort;Sore Pain Onset: On-going Pain Intervention(s): RN made aware  Prior Functioning Cognitive/Linguistic Baseline: Information not available Type of Home: Apartment  (Simultaneous filing.  User may not have seen previous data.)  Lives With: Son (Simultaneous filing. User may not have seen previous data.) Available Help at Discharge: Family;Available 24 hours/day (Simultaneous filing. User may not have seen previous data.) Vocation: Other (Comment)  SLP Evaluation Cognition Overall Cognitive Status: Impaired/Different from baseline (Simultaneous filing. User may not have seen previous data.) Arousal/Alertness: Awake/alert (Simultaneous filing. User may not have seen previous data.) Orientation Level: Oriented X4 Attention: Sustained (Simultaneous filing. User may not have seen previous data.) Sustained Attention: Impaired (Simultaneous filing. User may not have seen previous data.) Sustained Attention Impairment: Verbal basic;Verbal complex Memory: Impaired (Simultaneous filing. User may not have seen previous data.) Memory Impairment: Decreased recall of new information;Retrieval deficit Memory Recall Sock: Without Cue Memory Recall Blue: Without Cue Memory Recall Bed: Without Cue Awareness: Appears intact Problem Solving: Appears intact Reasoning: Impaired Reasoning Impairment: Verbal basic;Functional basic Safety/Judgment: Impaired (Simultaneous filing. User may not have seen previous data.)  Comprehension Auditory Comprehension Overall Auditory Comprehension: Appears within functional limits for tasks assessed Expression Expression Primary Mode of Expression: Verbal Verbal Expression Overall Verbal Expression: Appears within functional limits for tasks assessed Written Expression Dominant Hand: Right Oral Motor Oral Motor/Sensory Function Overall Oral Motor/Sensory Function: Within functional limits Motor Speech Overall Motor Speech: Appears within functional limits for tasks assessed Respiration: Within functional limits Resonance: Within functional limits Articulation: Within functional limitis Intelligibility: Intelligible Word:  75-100% accurate Phrase: 75-100% accurate Sentence: 75-100% accurate Conversation: 75-100% accurate Motor Speech Errors: Not applicable  Care Tool Care Tool Cognition Expression of Ideas and Wants Expression of Ideas and Wants: Without difficulty (complex and basic) - expresses complex messages without difficulty and with speech that is clear and easy to understand   Understanding Verbal and Non-Verbal Content Understanding Verbal and Non-Verbal Content: Understands (complex and basic) - clear comprehension without cues or repetitions   Memory/Recall Ability *first 3 days only Memory/Recall Ability *first 3 days only: Current season;That he or she is in a hospital/hospital unit     PMSV Assessment  PMSV Trial PMSV was placed for: patient placed on herself for total of 40 minutes Able to redirect subglottic air through upper airway: Yes Able to Attain Phonation: Yes Voice Quality: Low vocal intensity Able to Expectorate Secretions: No attempts Level of Secretion Expectoration with PMSV: Not observed Breath Support for Phonation: Adequate Intelligibility: Intelligible Word: 75-100% accurate Phrase: 75-100% accurate Sentence: 75-100% accurate Conversation: 75-100% accurate Respirations During Trial:  (WNL) SpO2 During Trial:  (WNL) Pulse During Trial:  (WNL) Behavior: Alert;Cooperative  Bedside Swallowing Assessment General Date of Onset: 02/06/20 Previous Swallow Assessment: MBS 02/27/20 Diet Prior to this Study: Dysphagia 2 (chopped);Honey-thick liquids Temperature Spikes Noted: No Respiratory Status: Trach Trach Size and Type: #6;Uncuffed;With PMSV in place History of Recent Intubation: Yes Length of Intubations (days): 11 days Date extubated: 02/19/20 Behavior/Cognition: Alert;Cooperative;Pleasant mood Oral Cavity - Dentition: Adequate natural dentition Self-Feeding Abilities: Able to feed self Vision: Functional for self-feeding Patient Positioning: Upright in  bed Baseline Vocal Quality: Low vocal intensity Volitional Cough: Strong Volitional Swallow: Able to elicit  Oral Care Assessment   Ice Chips Ice chips: Not tested Thin Liquid   Nectar Thick   Honey Thick Honey Thick Liquid: Impaired Presentation: Straw;Self fed Pharyngeal Phase Impairments: Throat Clearing - Delayed Other Comments: Delayed throat clearing and cough was dry-sounding but unable to determine if related to liquid PO's or pharyngeal secretions, etc. Puree Puree: Not tested Solid Solid: Not tested BSE Assessment Risk for Aspiration Impact on safety and function: Mild aspiration  risk;Moderate aspiration risk Other Related Risk Factors: Prolonged intubation;Deconditioning  Short Term Goals: Week 1: SLP Short Term Goal 1 (Week 1): Paient will participate in repeat MBS to determine swallow function improvements and make diet recommendations SLP Short Term Goal 2 (Week 1): Patient will achieve and maintain adequate vocal intensity at conversational level with PMV in place and minA. SLP Short Term Goal 3 (Week 1): Patient will tolerate Dys 2, honey thick liquids diet without overt s/s aspiration or penetration and with minA cues. SLP Short Term Goal 4 (Week 1): Patient will participate in full evaluation of cognition. SLP Short Term Goal 5 (Week 1): Paitent will recall and demonstrate understanding of precautions, strategies, exercises learned in PT/OT/ST with minA.  Refer to Care Plan for Long Term Goals  Recommendations for other services: Neuropsych  Discharge Criteria: Patient will be discharged from SLP if patient refuses treatment 3 consecutive times without medical reason, if treatment goals not met, if there is a change in medical status, if patient makes no progress towards goals or if patient is discharged from hospital.  The above assessment, treatment plan, treatment alternatives and goals were discussed and mutually agreed upon: by patient  Sonia Baller,  MA, CCC-SLP Speech Therapy

## 2020-03-06 NOTE — Progress Notes (Signed)
[facility] PHYSICAL MEDICINE & REHABILITATION PROGRESS NOTE  Subjective/Complaints: Patient seen sitting up in bed this morning.  She states she did not sleep well overnight due to coughing after trach was downsized yesterday.  ROS: + Cough. Denies CP, SOB, N/V/D  Objective: Vital Signs: Blood pressure (!) 125/91, pulse (!) 110, temperature 98 F (36.7 C), resp. rate 19, height 4' 11"  (1.499 m), weight 105.2 kg, last menstrual period 02/29/2020, SpO2 97 %. No results found. Recent Labs    03/04/20 0013 03/06/20 0508  WBC 7.1 6.7  HGB 9.9* 10.4*  HCT 31.0* 32.6*  PLT 309 323   Recent Labs    03/04/20 0013 03/06/20 0508  NA 135 137  K 3.7 3.8  CL 98 101  CO2 25 24  GLUCOSE 125* 100*  BUN 16 10  CREATININE 0.61 0.63  CALCIUM 8.6* 9.1    Intake/Output Summary (Last 24 hours) at 03/06/2020 1302 Last data filed at 03/06/2020 0700 Gross per 24 hour  Intake 356 ml  Output -  Net 356 ml        Physical Exam: BP (!) 125/91 (BP Location: Right Arm)   Pulse (!) 110   Temp 98 F (36.7 C)   Resp 19   Ht 4' 11"  (1.499 m)   Wt 105.2 kg   LMP 02/29/2020 (Approximate)   SpO2 97%   BMI 46.84 kg/m  Constitutional: No distress . Vital signs reviewed.  Obese. HENT: Normocephalic.  Atraumatic.  + NG. Neck: +Trach with PMV and trach collar Eyes: EOMI. No discharge. Cardiovascular: No JVD.  Tachycardia. Respiratory: Normal effort.  No stridor.  Bilateral clear to auscultation. GI: Non-distended.  BS +. Skin: Warm and dry.  Intact. Psych: Anxious. Normal mood.   Musc: No edema in extremities.  No tenderness in extremities. Neuro: Alert Motor: 4-/5 throughout  Assessment/Plan: 1. Functional deficits which require 3+ hours per day of interdisciplinary therapy in a comprehensive inpatient rehab setting.  Physiatrist is providing close team supervision and 24 hour management of active medical problems listed below.  Physiatrist and rehab team continue to assess barriers to  discharge/monitor patient progress toward functional and medical goals   Care Tool:  Bathing              Bathing assist       Upper Body Dressing/Undressing Upper body dressing   What is the patient wearing?: Hospital gown only    Upper body assist      Lower Body Dressing/Undressing Lower body dressing            Lower body assist       Toileting Toileting    Toileting assist       Transfers Chair/bed transfer  Transfers assist     Chair/bed transfer assist level: Minimal Assistance - Patient > 75%     Locomotion Ambulation   Ambulation assist      Assist level: Moderate Assistance - Patient 50 - 74% Assistive device: Walker-rolling Max distance: 20'   Walk 10 feet activity   Assist     Assist level: Moderate Assistance - Patient - 50 - 74% Assistive device: Walker-rolling   Walk 50 feet activity   Assist Walk 50 feet with 2 turns activity did not occur: Safety/medical concerns         Walk 150 feet activity   Assist Walk 150 feet activity did not occur: Safety/medical concerns         Walk 10 feet on uneven surface  activity  Assist Walk 10 feet on uneven surfaces activity did not occur: Safety/medical concerns         Wheelchair     Assist Will patient use wheelchair at discharge?: Yes Type of Wheelchair: Manual Wheelchair activity did not occur: Safety/medical concerns  Wheelchair assist level:  (pt would benefit from [facility] assessment however unable to assess during inital eval 2/2 fatigue and anxiety)      Wheelchair 50 feet with 2 turns activity    Assist    Wheelchair 50 feet with 2 turns activity did not occur: Safety/medical concerns       Wheelchair 150 feet activity     Assist  Wheelchair 150 feet activity did not occur: Safety/medical concerns        Medical Problem List and Plan: 1.  Decreased activity tolerance, hypoxia with activity, generalized weakness with flexed  posture, narrow base of support with unsteadiness and LE fatigue with ambulation secondary to CIM.  Begin CIR evaluations 2.  Antithrombotics: -DVT/anticoagulation:  Pharmaceutical: Lovenox             -antiplatelet therapy: NA 3. Substance abuse/Pain Management: On dilaudid and methadone.              Tramadol added on 1/13  Plan to wean Dilaudid tomorrow 4. Mood: LCSW to follow for evaluation and support.              Will request neuropsych evaluation  See #8             -antipsychotic agents: N/A--Risperdal d/c  5. Neuropsych: This patient is not fully capable of making decisions on her own behalf. 6. Skin/Wound Care: Routine pressure relief measures.  7. Fluids/Electrolytes/Nutrition: Monitor I/O.              BMP within acceptable range on 1/13 8. Anxiety disorder: Question of Bipolar d/o-->patient denies and reports mania due to multiple psych medications. Does not want to try buspar or klonopin-->prefers to get back to "home regimen".               See #4  Will consider Celexa 9. VDRF secondary to Covid 19 PNA/sepsis: Last course of antibiotics for MSSA VAP completed 01/02. CFS #6.  10. GERD: Protonix bid not as effective and would like to get back on dexilant 11. Hyperglycemia due to tube feeds: Hgb A1C-5.8.  Mildly elevated on 1/13 12. Dysphagia: Continue dysphagia #2, honey liquids. TF adjusted to HS yesterday. Will start calorie count to monitor intake. AKI resolved with water flushes--need to encourage thickened fluids intake.   Advance diet as tolerated 13.  Transaminitis  ALT elevated on 1/13, improving  Continue to monitor 14.  Acute blood loss anemia  Hemoglobin 10.4 on 1/13  Continue to monitor  LOS: 1 days A FACE TO FACE EVALUATION WAS PERFORMED  Belinda [name] Belinda [name] 03/06/2020, 1:02 PM

## 2020-03-06 NOTE — Evaluation (Signed)
Physical Therapy Assessment and Plan  Patient Details  Name: Belinda [name] MRN: [id number] Date of Birth: Aug 23, 1980  PT Diagnosis: Abnormal posture, Abnormality of gait, Difficulty walking, Impaired cognition, Muscle weakness and Pain in at trach site and slight headache per pt Rehab Potential: Fair ELOS: 2 weeks   Today's Date: 03/06/2020 PT Individual Time: 1050-1200 PT Individual Time Calculation (min): 70 min    Hospital Problem: Principal Problem:   Critical illness myopathy Active Problems:   Debility   Chronic pain syndrome   Substance abuse ([place])   Acute blood loss anemia   Transaminitis   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 Anxiety    compulsive worring, phobia  . Anxiety   . Anxiety disorder   . Asthma   . Bipolar affective ([place])   . Fatty liver 11/14/2018  . Fatty liver   . GAD (generalized anxiety disorder)    with panic attacks  . GERD (gastroesophageal reflux disease)   . Morbid obesity ([place])   . Narcotic addiction ([place])   . Narcotic drug use   . OCD (obsessive compulsive disorder)   . Psoriasis   . Reactive airways dysfunction syndrome ([place])   . Vaginal Pap smear, abnormal    Past Surgical History:  Past Surgical History:  Procedure Laterality Date  . CESAREAN SECTION     twice  . COLPOSCOPY    . DILATION AND CURETTAGE OF UTERUS    . ESOPHAGOGASTRODUODENOSCOPY  07/2018  . ESOPHAGOGASTRODUODENOSCOPY (EGD) WITH PROPOFOL N/A 08/03/2018   Procedure: ESOPHAGOGASTRODUODENOSCOPY (EGD) WITH PROPOFOL;  Surgeon: Daneil Dolin, MD;  Location: [facility];  Service: Endoscopy;  Laterality: N/A;  11:00am  . PID      Assessment & Plan Clinical Impression: Patient is a 40 y.o. year old female withhistory of anxiety d/o, morbid obesity, GERD who was admitted on 01/27/20 via [facility] with hypoxia, confusion and delirium wht hypotension due to Covid 19 PNA.  History taken from chart review and  patient due to cognition.  Patient was sedated with precedex due to agitation, intubated and treated with Covid protocol-Remdesiver, barcitinib, Solumedrol and antibiotics. Hospital course significant for issues with agitation and combativeness (required fentanyl, Precedex, ketamine, versed)  as well as worsening of respiratory status due to ARDS requiring intubation. CT head unremarkable for acute intracranial process on 12/15 and again on 12/27.  UDS positive for benzo's and opiates. She was started on methadone on 02/16/2020 due to concerns of drug withdrawal and on 12/26 family (confirmed with patient) reported that patient had been taking her fianc's Suboxone for the past year and ( hx of accidental ibuprofen/ETOH OD in 2007--question "another substance" per mother)  Hospital course further complicated by acute lower UTI.  Klebsiella UTI with sepsis treated with Unasyn.  Neurology consulted for input on refractory delirium and encephalopathy, which was felt to be multifactorial. EEG done negative for seizures and showed severe diffuse encephalopathy.  She developed ARDS with CAP v/s aspiration and underwent tracheostomy on 02/19/2020 by Dr. Tamala Julian.  She was being weaned to ATC and on 02/25/2020 found to have disconjugate gaze with question of Left> right sided weakness.  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  QTc being monitored while on methadone with as  needed Haldol.  Mentation improving and scheduled hydromorphone and methadone being weaned off. Risperdal DC'd due to concerns of tremors/jerks. Xanax resumed per home regimen but she was reporting  diarrhea with attempts to wean dilaudid and increased to 1 mg tid and methadone increased to 10 mg bid on  03/04/2020 fluid overload treated with intermittent IV diuresis and now on oral lasix. Tolerating dysphagia 2 with honey liquids but continues on nocturnal tube feeds due to poor intake. PCCM following for input and recommended downsize to cuffless #6 but patient wanted to wait until today due to anxiety. Therapy has been ongoing and patient limited by decreased activity tolerance, hypoxia with activity,  weakness with flexed posture, narrow base of support with unsteadiness and LE fatigue with ambulation. CIR recommended due to functional deficits in mobility and ADLs.  Please see preadmission assessment from earlier today as well. Patient transferred to CIR on 03/05/2020 .   Patient currently requires mod with mobility secondary to muscle weakness, decreased cardiorespiratoy endurance and decreased oxygen support and decreased sitting balance, decreased standing balance, decreased postural control, decreased balance strategies and difficulty maintaining precautions.  Prior to hospitalization, patient was independent  with mobility and lived with Son (20 yo son) in a [place] home.  Home access is  Level entry.  Patient will benefit from skilled PT intervention to maximize safe functional mobility, minimize fall risk and decrease caregiver burden for planned discharge home with intermittent assist.  Anticipate patient will benefit from follow up [facility] at discharge.  PT - End of Session Activity Tolerance: Tolerates 30+ min activity with multiple rests Endurance Deficit: Yes PT Assessment Rehab Potential (ACUTE/IP ONLY): Fair PT Barriers to Discharge: Medication compliance;Inaccessible home environment;New oxygen;Weight PT Barriers to Discharge Comments: very anxious PT Patient demonstrates impairments in the following area(s): Balance;Behavior;Endurance;Pain;Safety PT Transfers Functional Problem(s): Bed Mobility;Bed to Chair;Car;Furniture PT Locomotion Functional Problem(s): Ambulation;Stairs PT  Plan PT Intensity: Minimum of 1-2 x/day ,45 to 90 minutes PT Frequency: 5 out of 7 days PT Duration Estimated Length of Stay: 2-3 weeks PT Treatment/Interventions: Ambulation/gait training;Discharge planning;Functional mobility training;Psychosocial support;Therapeutic Activities;Visual/perceptual remediation/compensation;Wheelchair propulsion/positioning;Skin care/wound management;Therapeutic Exercise;Neuromuscular re-education;Disease management/prevention;Balance/vestibular training;Cognitive remediation/compensation;Pain management;DME/adaptive equipment instruction;Splinting/orthotics;UE/LE Strength taining/ROM;UE/LE Coordination activities;Stair training;Patient/family education;Functional electrical stimulation;Community reintegration PT Transfers Anticipated Outcome(s): mod I PT Locomotion Anticipated Outcome(s): mod I PT Recommendation Recommendations for Other Services: Neuropsych consult;Therapeutic Recreation consult Therapeutic Recreation Interventions: Stress management Follow Up Recommendations: Home health PT Patient destination: Home Equipment Recommended: To be determined   PT Evaluation Precautions/Restrictions   General   Vital SignsTherapy Vitals Temp: 98.4 F (36.9 C) Temp Source: Oral Pulse Rate: (!) 107 Resp: (!) 21 BP: 121/80 Patient Position (if appropriate): Lying Oxygen Therapy SpO2: 96 % O2 Device: Room Air O2 Flow Rate (L/min): 5 L/min FiO2 (%): 28 % Pain   Home Living/Prior Functioning Home Living Living Arrangements: Children;Spouse/significant other Vision/Perception  Perception Perception: Within Functional Limits Praxis Praxis: Intact  Cognition Overall Cognitive Status: Impaired/Different from baseline Arousal/Alertness: Awake/alert Orientation Level: Disoriented to time;Oriented to situation;Oriented to place;Oriented to person Executive Function: Sequencing;Reasoning Reasoning: Impaired Reasoning Impairment: Verbal basic;Functional  basic Sequencing: Impaired Sequencing Impairment: Verbal basic;Functional basic Safety/Judgment: Impaired Sensation Sensation Light Touch: Appears Intact Coordination Gross Motor Movements are Fluid and Coordinated: Yes Fine Motor Movements are Fluid and Coordinated: Yes Motor  Motor Motor: Within Functional Limits   Trunk/Postural Assessment  Cervical Assessment Cervical Assessment: Exceptions to [facility] (forward head carriage) Thoracic Assessment Thoracic Assessment: Exceptions to [facility] (kyphosis) Lumbar Assessment Lumbar Assessment: Exceptions to [facility] (posterior pelvic  tilt) Postural Control Postural Control: Deficits on evaluation  Balance Balance Balance Assessed: Yes Static Sitting Balance Static Sitting - Balance Support: Feet supported;No upper extremity supported Static Sitting - Level of Assistance: 5: Stand by assistance Dynamic Sitting Balance Dynamic Sitting - Balance Support: Feet supported;No upper extremity supported Dynamic Sitting - Level of Assistance: 4: Min assist Dynamic Sitting - Balance Activities: Lateral lean/weight shifting;Forward lean/weight shifting;Reaching across midline Static Standing Balance Static Standing - Balance Support: Bilateral upper extremity supported Static Standing - Level of Assistance: 4: Min assist Dynamic Standing Balance Dynamic Standing - Balance Support: Bilateral upper extremity supported Dynamic Standing - Level of Assistance: 3: Mod assist Extremity Assessment      RLE Assessment RLE Assessment: Exceptions to [facility] General Strength Comments: grossly 3+/5 LLE Assessment LLE Assessment: Exceptions to [facility] General Strength Comments: grossly 3+/5  Care Tool Care Tool Bed Mobility Roll left and right activity   Roll left and right assist level: Contact Guard/Touching assist    Sit to lying activity   Sit to lying assist level: Minimal Assistance - Patient > 75%    Lying to sitting edge of bed activity   Lying to  sitting edge of bed assist level: Minimal Assistance - Patient > 75%     Care Tool Transfers Sit to stand transfer   Sit to stand assist level: Minimal Assistance - Patient > 75%    Chair/bed transfer   Chair/bed transfer assist level: Minimal Assistance - Patient > 75%     Toilet transfer   Assist Level: Minimal Assistance - Patient > 75%    Car transfer Car transfer activity did not occur: Safety/medical concerns        Care Tool Locomotion Ambulation   Assist level: Moderate Assistance - Patient 50 - 74% Assistive device: Walker-rolling Max distance: 20'  Walk 10 feet activity   Assist level: Moderate Assistance - Patient - 50 - 74% Assistive device: Walker-rolling   Walk 50 feet with 2 turns activity Walk 50 feet with 2 turns activity did not occur: Safety/medical concerns      Walk 150 feet activity Walk 150 feet activity did not occur: Safety/medical concerns      Walk 10 feet on uneven surfaces activity Walk 10 feet on uneven surfaces activity did not occur: Safety/medical concerns      Stairs Stair activity did not occur: Safety/medical concerns        Walk up/down 1 step activity Walk up/down 1 step or curb (drop down) activity did not occur: Safety/medical concerns     Walk up/down 4 steps activity did not occuR: Safety/medical concerns  Walk up/down 4 steps activity      Walk up/down 12 steps activity Walk up/down 12 steps activity did not occur: Safety/medical concerns      Pick up small objects from floor Pick up small object from the floor (from standing position) activity did not occur: Safety/medical concerns      Wheelchair Will patient use wheelchair at discharge?: Yes Type of Wheelchair: Manual Wheelchair activity did not occur: Safety/medical concerns Wheelchair assist level:  (pt would benefit from [facility] assessment however unable to assess during inital eval 2/2 fatigue and anxiety)    Wheel 50 feet with 2 turns activity Wheelchair 50 feet  with 2 turns activity did not occur: Safety/medical concerns    Wheel 150 feet activity Wheelchair 150 feet activity did not occur: Safety/medical concerns      Refer to Care Plan for Long Term Goals  SHORT  TERM GOAL WEEK 1 PT Short Term Goal 1 (Week 1): pt to demonstrate supine<>sit supervision PT Short Term Goal 2 (Week 1): pt to demonstrate functional transfers with LRAD CGA consistently PT Short Term Goal 3 (Week 1): pt to demonstrate ambulation with LRAD 50' CGA  Recommendations for other services: Neuropsych and Therapeutic Recreation  Stress management  Skilled Therapeutic Intervention  Evaluation completed (see details above and below) with education on PT POC and goals and individual treatment initiated with focus on  Bed mobility, transfer training, gait training, safety awareness, call light use, standing balance. pt received in bed and agreeable to therapy, mother present. Pt reported pain at trach site and slight headache. Pt reported being very motivated to participate and very pleasant throughout session however demonstrated consistently high levels of anxiety with all mobility, pain levels, and coughing however able to redirect and improve levels of anxiety with relaxation techniques throughout session. Pt also demonstrated need of rest breaks with all activity 2/2 poor activity tolerance and new need of O2. Pt had multiple questions about POC, outcomes, new need of O2, medications, and current functional status. PT educated to ask MD or nursing staff about all medication, O2 needs, and medical questions, she agreed, and PT educated pt on PT POC, functional status, and recommendations.  Pt directed in supine>sit min A-CGA, sat EOB for extra time 2/2 need of rest break and VC for breathing technique. Pt directed in x4 Sit to stand from EOB to Hand held assist at min A with VC for technique. Pt directed in static standing with each stand with Hand held assist for stability and VC for  trunk extension, breathing technique and midline positioning. Pt directed in gait training with Hand held assist for forward backward 4' each way of 8' (forward 4' and backward 4') x2 and additional 20' total forward backward pattern with seated rest break post activity and mod A grossly with each set 2/2 decreased balance and need of mod A for right pt with noted 3 LOB posteriorly and to Rt side. Pt required prolonged rest breaks post gait session but denied shortness of breath, only tired. Pt reported she needed to use restroom, BSC setup for pt to complete Stand pivot transfer with Hand held assist to/from [facility] at min A with VC for technique. Pt voided bladder and CGA for pericare. Pt returned to sitting EOB, requested to return to bed, sit>supine min A and pt left in bed, HOB elevated, mother present. All needs in reach and in good condition. Call light in hand.     Mobility Bed Mobility Bed Mobility: Rolling Right;Rolling Left;Supine to Sit;Sit to Supine;Scooting to [facility];Sitting - Scoot to [employer] of Bed Rolling Right: Contact Guard/Touching assist Rolling Left: Contact Guard/Touching assist Supine to Sit: Minimal Assistance - Patient > 75% Sitting - Scoot to Edge of Bed: Minimal Assistance - Patient > 75% Sit to Supine: Minimal Assistance - Patient > 75% Scooting to HOB: Minimal Assistance - Patient > 75% Transfers Transfers: Sit to [employer] Transfers Sit to Stand: Minimal Assistance - Patient > 75% Stand Pivot Transfers: Minimal Assistance - Patient > 75% Stand Pivot Transfer Details: Verbal cues for sequencing;Verbal cues for technique;Verbal cues for precautions/safety;Visual cues/gestures for sequencing;Verbal cues for safe use of DME/AE Transfer (Assistive device): Rolling walker Locomotion  Gait Ambulation: Yes Gait Assistance: Moderate Assistance - Patient 50-74% Gait Distance (Feet): 20 Feet Assistive device: Rolling walker Gait Assistance Details: Verbal cues for gait  pattern;Verbal cues for sequencing;Verbal cues for technique;Verbal  cues for precautions/safety Gait Assistance Details: x3 LOB posteriorly Gait Gait: Yes Gait Pattern: Impaired Gait Pattern: Step-to pattern;Poor foot clearance - right;Poor foot clearance - left;Trunk flexed;Shuffle Gait velocity: decreased Stairs / Additional Locomotion Stairs: No Wheelchair Mobility Wheelchair Mobility: Yes (pt would benefit from [facility] mobility assessment however unable to assess during initial eval 2/2 poor endurance and increased anxiety levels limiting pt's overall mobility)   Discharge Criteria: Patient will be discharged from PT if patient refuses treatment 3 consecutive times without medical reason, if treatment goals not met, if there is a change in medical status, if patient makes no progress towards goals or if patient is discharged from hospital.  The above assessment, treatment plan, treatment alternatives and goals were discussed and mutually agreed upon: by patient and by family  Junie Panning 03/06/2020, 3:06 PM

## 2020-03-06 NOTE — Progress Notes (Signed)
Inpatient Rehabilitation  Patient information reviewed and entered into eRehab system by Bransen Fassnacht M. Micholas Drumwright, M.A., CCC/SLP, PPS Coordinator.  Information including medical coding, functional ability and quality indicators will be reviewed and updated through discharge.    

## 2020-03-06 NOTE — Progress Notes (Signed)
Initial Nutrition Assessment  DOCUMENTATION CODES:   Morbid obesity  INTERVENTION:   - 48-hour calorie count ordered, RD to follow up tomorrow 1/14 to provide day 1 results  - Magic cup TID with meals, each supplement provides 290 kcal and 9 grams of protein  Nocturnal tube feeds via Cortrak: - Vital 1.5 @ 75 ml/hr x 10 hours from 2000 to 0600 (total of 750 ml) - Free water flushes of 200 ml QID  Tube feeding regimen provides 1125 kcal, 51 grams of protein, and 573 ml of H2O (meets 51% of kcal needs and 46% of protein needs).  Total free water with flushes: 1373 ml  NUTRITION DIAGNOSIS:   Inadequate oral intake related to decreased appetite,dysphagia as evidenced by meal completion < 25%.  GOAL:   Patient will meet greater than or equal to 90% of their needs  MONITOR:   PO intake,Supplement acceptance,Diet advancement,Labs,Weight trends,TF tolerance  REASON FOR ASSESSMENT:   Consult Assessment of nutrition requirement/status,Calorie Count,Enteral/tube feeding initiation and management  ASSESSMENT:   40 year old female with PMH of anxiety disorder, GERD. Pt was admitted on 01/27/20 with COVID-19 PNA. Pt with worsening of respiratory status due to ARDS requiring intubation. Pt underwent tracheostomy on 02/19/20 and was weaned to trach collar. Pt is tolerating a dysphagia 2 diet with honey-thick liquids but continues on nocturnal tube feeds via Cortrak due to poor PO intake. Admitted to CIR on 03/06/20.   RD consulted for assessment, calorie count, and tube feeding initiation and management. Pt previously on nocturnal tube feeds prior to CIR admission but no tube feeding orders in place at this time. RD will order nocturnal tube feeding to aid pt in meeting increased kcal and protein needs and stimulate appetite during the day.  A 48-hour calorie count has been ordered. Calorie count started today at breakfast meal. RD will follow up tomorrow to provide day 1  results.  Discussed pt with RN and NT. Per RN, pt did not eat breakfast this morning because she does not normally eat breakfast. Pt is highly anxious.  Met with pt and pt's mother at bedside. Pt tearful and emotional. Pt requesting to have Cortrak removed and to go home. Emotional support provided. Unable to obtain diet and weight history from pt at this time due to emotional distress. Pt's mother at bedside comforting pt.  Reviewed weight history in chart. Current weight consistent with weights from March and September of 2021.  Meal Completion: 0% x 2 documented meals  Medications reviewed and include: lasix 20 mg daily, SSI q 4 hours, levemir 5 units BID, melatonin, methadone, MVI with minerals, protonix, thiamine  Labs reviewed. CBG's: 72-127 x 24 hours  NUTRITION - FOCUSED PHYSICAL EXAM:  Unable to complete at this time. Pt very anxious and emotional.  Diet Order:   Diet Order            DIET DYS 2 Room service appropriate? Yes; Fluid consistency: Honey Thick  Diet effective now                 EDUCATION NEEDS:   Not appropriate for education at this time  Skin:  Skin Assessment: Reviewed RN Assessment  Last BM:  03/05/20  Height:   Ht Readings from Last 1 Encounters:  03/05/20 4' 11"  (1.499 m)    Weight:   Wt Readings from Last 1 Encounters:  03/05/20 105.2 kg    BMI:  Body mass index is 46.84 kg/m.  Estimated Nutritional Needs:   Kcal:  2200-2400  Protein:  110-130 grams  Fluid:  >/= 2.0 L    Gustavus Bryant, MS, RD, LDN Inpatient Clinical Dietitian Please see [facility] for contact information.

## 2020-03-07 ENCOUNTER — Inpatient Hospital Stay (HOSPITAL_COMMUNITY): Payer: Medicaid Other | Admitting: Occupational Therapy

## 2020-03-07 ENCOUNTER — Inpatient Hospital Stay (HOSPITAL_COMMUNITY): Payer: Medicaid Other | Admitting: Speech Pathology

## 2020-03-07 ENCOUNTER — Inpatient Hospital Stay (HOSPITAL_COMMUNITY): Payer: Medicaid Other

## 2020-03-07 LAB — GLUCOSE, CAPILLARY
Glucose-Capillary: 110 mg/dL — ABNORMAL HIGH (ref 70–99)
Glucose-Capillary: 110 mg/dL — ABNORMAL HIGH (ref 70–99)
Glucose-Capillary: 111 mg/dL — ABNORMAL HIGH (ref 70–99)
Glucose-Capillary: 121 mg/dL — ABNORMAL HIGH (ref 70–99)
Glucose-Capillary: 127 mg/dL — ABNORMAL HIGH (ref 70–99)
Glucose-Capillary: 87 mg/dL (ref 70–99)

## 2020-03-07 MED ORDER — VITAL 1.5 CAL PO LIQD: 900.0000 mL | ORAL | Status: DC

## 2020-03-07 MED ORDER — HYDROMORPHONE HCL 2 MG PO TABS
1.0000 mg | ORAL_TABLET | Freq: Every day | ORAL | Status: DC | PRN
  Administered 2020-03-07 – 2020-03-08 (×2): 1 mg via ORAL
  Filled 2020-03-07 (×2): qty 1

## 2020-03-07 MED ORDER — CHLORHEXIDINE GLUCONATE 0.12 % MT SOLN
OROMUCOSAL | Status: AC
  Filled 2020-03-07: qty 15

## 2020-03-07 MED ORDER — AYR SALINE NASAL NA GEL
1.0000 "application " | Freq: Four times a day (QID) | NASAL | Status: DC
  Administered 2020-03-08 – 2020-03-17 (×4): 1 via NASAL
  Filled 2020-03-07: qty 14.1

## 2020-03-07 MED ORDER — NON FORMULARY: 60.0000 mg | Freq: Two times a day (BID) | Status: DC

## 2020-03-07 MED ORDER — FEXOFENADINE HCL 60 MG PO TABS
60.0000 mg | ORAL_TABLET | Freq: Two times a day (BID) | ORAL | Status: DC
  Administered 2020-03-07 – 2020-03-18 (×22): 60 mg via ORAL
  Filled 2020-03-07 (×23): qty 1

## 2020-03-07 MED ORDER — VITAL 1.5 CAL PO LIQD
900.0000 mL | ORAL | Status: DC
  Administered 2020-03-07 – 2020-03-09 (×3): 900 mL
  Filled 2020-03-07 (×3): qty 948

## 2020-03-07 NOTE — Progress Notes (Signed)
[place] PHYSICAL MEDICINE & REHABILITATION PROGRESS NOTE  Subjective/Complaints: Patient seen sitting up in bed this morning.  Good sitting balance noted.  She states she slept well overnight.  She states she had a very long day of therapies yesterday.  Had a prolonged discussion with patient regarding weaning pain meds.  Patient would like to wait and decrease even slower and discussed due to complaints of throat pain post trach change.  Attempted to educate, especially given addition of medication yesterday.   ROS: + Throat pain.  Denies CP, SOB, N/V/D  Objective: Vital Signs: Blood pressure 107/69, pulse (!) 106, temperature 98.4 F (36.9 C), temperature source Oral, resp. rate 16, height 4' 11"  (1.499 m), weight 105.2 kg, last menstrual period 02/29/2020, SpO2 96 %. No results found. Recent Labs    03/06/20 0508  WBC 6.7  HGB 10.4*  HCT 32.6*  PLT 323   Recent Labs    03/06/20 0508  NA 137  K 3.8  CL 101  CO2 24  GLUCOSE 100*  BUN 10  CREATININE 0.63  CALCIUM 9.1    Intake/Output Summary (Last 24 hours) at 03/07/2020 1050 Last data filed at 03/07/2020 0903 Gross per 24 hour  Intake 646 ml  Output -  Net 646 ml        Physical Exam: BP 107/69 (BP Location: Left Arm)   Pulse (!) 106   Temp 98.4 F (36.9 C) (Oral)   Resp 16   Ht 4' 11"  (1.499 m)   Wt 105.2 kg   LMP 02/29/2020 (Approximate)   SpO2 96%   BMI 46.84 kg/m  Constitutional: No distress . Vital signs reviewed.  Obese. HENT: Normocephalic.  Atraumatic.  + NG. Neck: + Trach with PMV and trach collar, #6 cuffless. Eyes: EOMI. No discharge. Cardiovascular: No JVD.  RRR. Respiratory: Normal effort.  No stridor.  Bilateral clear to auscultation. GI: Non-distended.  BS +. Skin: Warm and dry.  Intact. Psych: Normal mood.  Anxious about weaning pain meds. Musc: No edema in extremities.  No tenderness in extremities. Neuro: Alert Motor: 4-/5 throughout, unchanged  Assessment/Plan: 1. Functional  deficits which require 3+ hours per day of interdisciplinary therapy in a comprehensive inpatient rehab setting.  Physiatrist is providing close team supervision and 24 hour management of active medical problems listed below.  Physiatrist and rehab team continue to assess barriers to discharge/monitor patient progress toward functional and medical goals   Care Tool:  Bathing    Body parts bathed by patient: Right arm,Left arm,Chest,Abdomen,Front perineal area,Buttocks,Right upper leg,Left upper leg,Right lower leg,Left lower leg,Face         Bathing assist Assist Level: Contact Guard/Touching assist     Upper Body Dressing/Undressing Upper body dressing   What is the patient wearing?: Hospital gown only    Upper body assist Assist Level: Minimal Assistance - Patient > 75%    Lower Body Dressing/Undressing Lower body dressing      What is the patient wearing?: Hospital gown only     Lower body assist Assist for lower body dressing: Contact Guard/Touching assist     Toileting Toileting    Toileting assist Assist for toileting: Minimal Assistance - Patient > 75%     Transfers Chair/bed transfer  Transfers assist     Chair/bed transfer assist level: Minimal Assistance - Patient > 75%     Locomotion Ambulation   Ambulation assist      Assist level: Moderate Assistance - Patient 50 - 74% Assistive device: Walker-rolling Max  distance: 20'   Walk 10 feet activity   Assist     Assist level: Moderate Assistance - Patient - 50 - 74% Assistive device: Walker-rolling   Walk 50 feet activity   Assist Walk 50 feet with 2 turns activity did not occur: Safety/medical concerns         Walk 150 feet activity   Assist Walk 150 feet activity did not occur: Safety/medical concerns         Walk 10 feet on uneven surface  activity   Assist Walk 10 feet on uneven surfaces activity did not occur: Safety/medical concerns          Wheelchair     Assist Will patient use wheelchair at discharge?: Yes Type of Wheelchair: Manual Wheelchair activity did not occur: Safety/medical concerns  Wheelchair assist level:  (pt would benefit from [facility] assessment however unable to assess during inital eval 2/2 fatigue and anxiety)      Wheelchair 50 feet with 2 turns activity    Assist    Wheelchair 50 feet with 2 turns activity did not occur: Safety/medical concerns       Wheelchair 150 feet activity     Assist  Wheelchair 150 feet activity did not occur: Safety/medical concerns        Medical Problem List and Plan: 1.  Decreased activity tolerance, hypoxia with activity, generalized weakness with flexed posture, narrow base of support with unsteadiness and LE fatigue with ambulation secondary to CIM.  Continue CIR 2.  Antithrombotics: -DVT/anticoagulation:  Pharmaceutical: Lovenox             -antiplatelet therapy: NA 3. Substance abuse/Pain Management:   On methadone.              Tramadol added on 1/13  Decrease Dilaudid.  On 1/14, continue to wean 4. Mood: LCSW to follow for evaluation and support.              Will request neuropsych evaluation  See #8             -antipsychotic agents: N/A--Risperdal d/c  5. Neuropsych: This patient is not fully capable of making decisions on her own behalf. 6. Skin/Wound Care: Routine pressure relief measures.  7. Fluids/Electrolytes/Nutrition: Monitor I/O.              BMP within acceptable range on 1/13 8. Anxiety disorder: Question of Bipolar d/o-->patient denies and reports mania due to multiple psych medications.   Does not want to try buspar or klonopin, SSRIs/SNRIs-states that nobody has an adverse reaction to them              See #4 9. VDRF secondary to Covid 19 PNA/sepsis: Last course of antibiotics for MSSA VAP completed 01/02. CFS #6.  10. GERD: Protonix bid not as effective and would like to get back on dexilant 11. Hyperglycemia due to tube  feeds: Hgb A1C-5.8.  Mildly elevated on 1/14 12. Dysphagia: Continue dysphagia #2, honey liquids. TF adjusted to HS   Started calorie count to monitor intake.   AKI resolved with water flushes--need to encourage thickened fluids intake.   Advance diet as tolerated 13.  Transaminitis  ALT elevated on 1/13, improving  Continue to monitor 14.  Acute blood loss anemia  Hemoglobin 10.4 on 1/13  Continue to monitor  LOS: 2 days A FACE TO FACE EVALUATION WAS PERFORMED  Jemari Hallum [name] 03/07/2020, 10:50 AM

## 2020-03-07 NOTE — Progress Notes (Signed)
Speech Language Pathology Daily Session Note  Patient Details  Name: Belinda [name] MRN: [id number] Date of Birth: [birth year]  Today's Date: 03/07/2020 SLP Individual Time: 1030-1314 SLP Individual Time Calculation (min): 58 min  Short Term Goals: Week 1: SLP Short Term Goal 1 (Week 1): Paient will participate in repeat MBS to determine swallow function improvements and make diet recommendations SLP Short Term Goal 2 (Week 1): Patient will achieve and maintain adequate vocal intensity at conversational level with PMV in place and minA. SLP Short Term Goal 3 (Week 1): Patient will tolerate Dys 2, honey thick liquids diet without overt s/s aspiration or penetration and with minA cues. SLP Short Term Goal 4 (Week 1): Patient will participate in full evaluation of cognition. SLP Short Term Goal 5 (Week 1): Paitent will recall and demonstrate understanding of precautions, strategies, exercises learned in PT/OT/ST with minA.  Skilled Therapeutic Interventions: Pt was seen for skilled ST targeting dysphagia and cognitive goals. Pt had PMSV donned at baseline, voice appeared stronger than it may have been yesterday, and only 1 verbal cue was required for intelligibility to repeat message with greater vocal intensity. She also demonstrated ability to donn and doff PMSV independently. Pt consumed portions of her dys 2 (minced/ground) solid lunch tray and honey thick liquids without overt s/sx. Mastication and oral clearance was efficient with Supervision A verbal cueing. SLP also administered MOCA version 7.1, which pt scored 24/30 (N=26 or >), indicative of mild deficits, primarily in attention and short term recall. Pt endorses feeling as though her short term recall is still impaired from baseline. She also has hx of anxiety, which made it difficult to determine whether level of distractibility today (required cues to stay on topic and on task) was due to anxiety or s/p acute illness. She continues to  benefit from skilled ST interventions to address dysphagia, cognition, and speech. Pt left sitting in chair with alarm set and needs within reach. Continue per current plan of care.          Pain Pain Assessment Pain Score: Asleep  Therapy/Group: Individual Therapy  Arbutus Leas 03/07/2020, 7:24 AM

## 2020-03-07 NOTE — Progress Notes (Signed)
Calorie Count Note: Day 1  48-hour calorie count ordered. Calorie count started yesterday at breakfast meal. Please see Day 1 results below.  RD will follow up on Monday, 03/10/20, to provide Day 2 results.  Diet: dysphagia 2 with honey thick liquids Supplements: - Magic Cup TID with meals, each supplement provides 290 kcal and 9 grams of protein  Day 1 (03/06/20): Breakfast: 0 kcal, 0 grams of protein (pt did not eat breakfast) Lunch: 235 kcal, 6 grams of protein Dinner: 94 kcal, 6 grams of protein  Day 1 total 24-hour intake: 329 kcal (15% of minimum estimated needs)  12 grams of protein (11% of minimum estimated needs)  Nutrition Diagnosis: Inadequate oral intake related to decreased appetite,dysphagia as evidenced by meal completion < 25%.  Goal: Patient will meet greater than or equal to 90% of their needs  Intervention:  - Continue Magic Cups TID with meals - Continue 48-hour calorie count - Continue nocturnal tube feeds via Cortrak, increase Vital 1.5 to 90 ml/hr x 10 hours from 2000 to 0600 (total of 900 ml) to provide 1350 kcal and 61 grams of protein   Gustavus Bryant, MS, RD, LDN Inpatient Clinical Dietitian Please see [facility] for contact information.

## 2020-03-07 NOTE — Progress Notes (Signed)
Occupational Therapy Session Note  Patient Details  Name: Belinda [name] MRN: [id number] Date of Birth: 1980-03-23  Today's Date: 03/07/2020 OT Individual Time: 0340-3524 OT Individual Time Calculation (min): 36 min    Short Term Goals: Week 1:  OT Short Term Goal 1 (Week 1): Pt will complete toileting with supervision OT Short Term Goal 2 (Week 1): Pt will complete UB/LB bathing with supervision OT Short Term Goal 3 (Week 1): Pt will complete toilet transfer with supervision OT Short Term Goal 4 (Week 1): Pt will complete UB/LB dressing with supervision.  Skilled Therapeutic Interventions/Progress Updates:    Pt received in bed stating she was feeling much more energetic today. Pt very active and talkative today and maintained O2 sats of 96-98% on 6L with HR 105-110. Pt eager to toilet.  Worked on stand pivot to [facility] and back to bed with only CGA.  Min to rise to stand and complete toileting and LB dressing with mod to fully adjust over hips. Set up with UB.   Pt participated extremely well. Resting in bed with alarm set and all needs met.   Therapy Documentation Precautions:  Precautions Precautions: Fall Precaution Comments: trach, cortrak, watch HR, monitor BP Restrictions Weight Bearing Restrictions: No    Vital Signs: Therapy Vitals Pulse Rate: (!) 106 Resp: 16 Oxygen Therapy SpO2: 96 % O2 Device: Tracheostomy Collar O2 Flow Rate (L/min): 5 L/min FiO2 (%): 28 % Pain: Pain Assessment Pain Score: 0-No pain   Therapy/Group: Individual Therapy  [place] 03/07/2020, 11:21 AM

## 2020-03-07 NOTE — Progress Notes (Signed)
Physical Therapy Session Note  Patient Details  Name: Belinda [name] MRN: [id number] Date of Birth: August 23, 1980  Today's Date: 03/07/2020 PT Individual Time: 1000-1100 PT Individual Time Calculation (min): 60 min   Short Term Goals: Week 1:  PT Short Term Goal 1 (Week 1): pt to demonstrate supine<>sit supervision PT Short Term Goal 2 (Week 1): pt to demonstrate functional transfers with LRAD CGA consistently PT Short Term Goal 3 (Week 1): pt to demonstrate ambulation with LRAD 50' CGA  Skilled Therapeutic Interventions/Progress Updates:    Pt greeted long sitting in bed, awake and agreeable to therapy. No reports of pain. Pt applied PMV throughout session. Pt on 5L 28% FiO2 trach collar, saturating at 98% resting. Coordinated with nursing to provide adapter for trach collar to fit to portable oxygen. Pt quite verbose during our session and continues to be anxious with all functional mobility. Stand<>pivot with CGA/minA from EOB to w/c with cues for safety approach. Connected to 6L portable oxygen during our session. W/c transport for time management to day room gym.  Gait training 2x37f (lengthy seated rest) with minA and RW. Gait deficits include short step length bilaterally and very poor foot clearance. Cues for increasing bilateral heel strike but unable to provide adequate toe clearance to achieve this. Noted trembling in her legs towards end of gait session as well and requiring mod cues for safety approach to her w/c with RW management. SPo2 reading 95-97% after each gait trial with HR b/w 105-120.   Performed repeated sit<>stands, 2x5 bouts, with minA to RW from w/c height. Cues needed for hand placement and forward weight shift as she tends to lean to far posterior during rising.   W/c transport back to her room with totalA for energy conservation. She requested to remain seated in the w/c. Retrieved a different leg rest that was better fitting to improve seated position. Reconnected  to wall O2 at 5L. She remained seated in w/c with chair alarm on and her needs within reach. Pt very pleased with progress made .  Therapy Documentation Precautions:  Precautions Precautions: Fall Precaution Comments: trach, cortrak, watch HR, monitor BP Restrictions Weight Bearing Restrictions: No  Therapy/Group: Individual Therapy  Laurren Lepkowski P Kynley Metzger PT 03/07/2020, 12:04 PM

## 2020-03-08 LAB — GLUCOSE, CAPILLARY
Glucose-Capillary: 100 mg/dL — ABNORMAL HIGH (ref 70–99)
Glucose-Capillary: 101 mg/dL — ABNORMAL HIGH (ref 70–99)
Glucose-Capillary: 105 mg/dL — ABNORMAL HIGH (ref 70–99)
Glucose-Capillary: 117 mg/dL — ABNORMAL HIGH (ref 70–99)
Glucose-Capillary: 90 mg/dL (ref 70–99)
Glucose-Capillary: 96 mg/dL (ref 70–99)

## 2020-03-08 NOTE — Progress Notes (Signed)
[place] PHYSICAL MEDICINE & REHABILITATION PROGRESS NOTE  Subjective/Complaints: Requests that current opioid medications be stopped and she be started on Suboxone. States that she does not feel comfortable on Methadone. Advised that we can wean her current medications and refer her to outpatient Suboxone clinic. She is scared of feeling withdrawal during Methadone wean so prefers not to until she can receive outpatient Suboxone.   ROS: + Throat pain.  Denies CP, SOB, N/V/D, +jitteriness this morning prior to receiving methadone.   Objective: Vital Signs: Blood pressure 118/86, pulse (!) 108, temperature 98.6 F (37 C), temperature source Oral, resp. rate 18, height 4' 11"  (1.499 m), weight 105.2 kg, last menstrual period 02/29/2020, SpO2 95 %. No results found. Recent Labs    03/06/20 0508  WBC 6.7  HGB 10.4*  HCT 32.6*  PLT 323   Recent Labs    03/06/20 0508  NA 137  K 3.8  CL 101  CO2 24  GLUCOSE 100*  BUN 10  CREATININE 0.63  CALCIUM 9.1    Intake/Output Summary (Last 24 hours) at 03/08/2020 1715 Last data filed at 03/08/2020 1300 Gross per 24 hour  Intake 320 ml  Output --  Net 320 ml        Physical Exam: BP 118/86 (BP Location: Right Arm)   Pulse (!) 108   Temp 98.6 F (37 C) (Oral)   Resp 18   Ht 4' 11"  (1.499 m)   Wt 105.2 kg   LMP 02/29/2020 (Approximate)   SpO2 95%   BMI 46.84 kg/m   Constitutional: No distress . Vital signs reviewed.  Obese. HENT: Normocephalic.  Atraumatic.  + NG. Neck: + Trach with PMV and trach collar, #6 cuffless. Cardio: Reg rate Chest: normal effort, normal rate of breathing Abd: soft, non-distended Ext: no edema Psych: Normal mood.  Anxious about weaning pain meds. Musc: No edema in extremities.  No tenderness in extremities. Neuro: Alert Motor: 4-/5 throughout, unchanged:    Assessment/Plan: 1. Functional deficits which require 3+ hours per day of interdisciplinary therapy in a comprehensive inpatient rehab  setting.  Physiatrist is providing close team supervision and 24 hour management of active medical problems listed below.  Physiatrist and rehab team continue to assess barriers to discharge/monitor patient progress toward functional and medical goals   Care Tool:  Bathing    Body parts bathed by patient: Right arm,Left arm,Chest,Abdomen,Front perineal area,Buttocks,Right upper leg,Left upper leg,Right lower leg,Left lower leg,Face         Bathing assist Assist Level: Contact Guard/Touching assist     Upper Body Dressing/Undressing Upper body dressing   What is the patient wearing?: Bra,Pull over shirt    Upper body assist Assist Level: Supervision/Verbal cueing    Lower Body Dressing/Undressing Lower body dressing      What is the patient wearing?: Pants     Lower body assist Assist for lower body dressing: Minimal Assistance - Patient > 75%     Toileting Toileting    Toileting assist Assist for toileting: Moderate Assistance - Patient 50 - 74%     Transfers Chair/bed transfer  Transfers assist     Chair/bed transfer assist level: Minimal Assistance - Patient > 75%     Locomotion Ambulation   Ambulation assist      Assist level: Minimal Assistance - Patient > 75% Assistive device: Walker-rolling Max distance: 64f   Walk 10 feet activity   Assist     Assist level: Minimal Assistance - Patient > 75% Assistive device:  Walker-rolling   Walk 50 feet activity   Assist Walk 50 feet with 2 turns activity did not occur: Safety/medical concerns         Walk 150 feet activity   Assist Walk 150 feet activity did not occur: Safety/medical concerns         Walk 10 feet on uneven surface  activity   Assist Walk 10 feet on uneven surfaces activity did not occur: Safety/medical concerns         Wheelchair     Assist Will patient use wheelchair at discharge?: Yes Type of Wheelchair: Manual Wheelchair activity did not occur:  Safety/medical concerns  Wheelchair assist level:  (pt would benefit from [facility] assessment however unable to assess during inital eval 2/2 fatigue and anxiety)      Wheelchair 50 feet with 2 turns activity    Assist    Wheelchair 50 feet with 2 turns activity did not occur: Safety/medical concerns       Wheelchair 150 feet activity     Assist  Wheelchair 150 feet activity did not occur: Safety/medical concerns        Medical Problem List and Plan: 1.  Decreased activity tolerance, hypoxia with activity, generalized weakness with flexed posture, narrow base of support with unsteadiness and LE fatigue with ambulation secondary to CIM.  Continue CIR 2.  Antithrombotics: -DVT/anticoagulation:  Pharmaceutical: Lovenox             -antiplatelet therapy: NA 3. Substance abuse/Pain Management:   On methadone.              Tramadol added on 1/13  Decrease Dilaudid.  On 1/14, continue to wean  Patient would like to be referred to Suboxone clinic outpatient given her history of opioid addiction.  4. Mood: LCSW to follow for evaluation and support.              Will request neuropsych evaluation  See #8             -antipsychotic agents: N/A--Risperdal d/c  5. Neuropsych: This patient is not fully capable of making decisions on her own behalf. 6. Skin/Wound Care: Routine pressure relief measures.  7. Fluids/Electrolytes/Nutrition: Monitor I/O.              BMP within acceptable range on 1/13 8. Anxiety disorder: Question of Bipolar d/o-->patient denies and reports mania due to multiple psych medications.   Does not want to try buspar or klonopin, SSRIs/SNRIs-states that nobody has an adverse reaction to them              See #4 9. VDRF secondary to Covid 19 PNA/sepsis: Last course of antibiotics for MSSA VAP completed 01/02. CFS #6.  10. GERD: Protonix bid not as effective and would like to get back on dexilant 11. Hyperglycemia due to tube feeds: Hgb A1C-5.8.  Mildly elevated  to 117 on 1/15- reviewed and has been as high as 142 on 1/9- continue CBG checks. Given obesity, consider discussion regarding staring metformin 272m daily for diabetes prevention 12. Dysphagia: Continue dysphagia #2, honey liquids. TF adjusted to HS   Started calorie count to monitor intake.   AKI resolved with water flushes--need to encourage thickened fluids intake.   Advance diet as tolerated 13.  Transaminitis  ALT elevated on 1/13, improving  Continue to monitor 14.  Acute blood loss anemia  Hemoglobin 10.4 on 1/13  Continue to monitor  >35 minutes spent in discussion of patient's history of opioid use disorder, advise  regarding weaning, discussion that you need certification to prescribe suboxone and we can refer her to an outpatient center, review of her CBGs, discussion of her anxiety with RN.   LOS: 3 days A FACE TO FACE EVALUATION WAS PERFORMED  Clide Deutscher Ijeoma Loor 03/08/2020, 5:15 PM

## 2020-03-09 ENCOUNTER — Inpatient Hospital Stay (HOSPITAL_COMMUNITY): Payer: Medicaid Other | Admitting: Speech Pathology

## 2020-03-09 ENCOUNTER — Inpatient Hospital Stay (HOSPITAL_COMMUNITY): Payer: Medicaid Other

## 2020-03-09 LAB — GLUCOSE, CAPILLARY
Glucose-Capillary: 100 mg/dL — ABNORMAL HIGH (ref 70–99)
Glucose-Capillary: 106 mg/dL — ABNORMAL HIGH (ref 70–99)
Glucose-Capillary: 113 mg/dL — ABNORMAL HIGH (ref 70–99)
Glucose-Capillary: 118 mg/dL — ABNORMAL HIGH (ref 70–99)
Glucose-Capillary: 84 mg/dL (ref 70–99)
Glucose-Capillary: 99 mg/dL (ref 70–99)

## 2020-03-09 MED ORDER — ROPINIROLE HCL 0.25 MG PO TABS
0.2500 mg | ORAL_TABLET | Freq: Every day | ORAL | Status: DC
  Administered 2020-03-15: 0.25 mg via ORAL
  Filled 2020-03-09 (×10): qty 1

## 2020-03-09 MED ORDER — LIDOCAINE VISCOUS HCL 2 % MT SOLN
15.0000 mL | Freq: Four times a day (QID) | OROMUCOSAL | Status: DC | PRN
  Filled 2020-03-09: qty 15

## 2020-03-09 NOTE — Progress Notes (Signed)
[place] PHYSICAL MEDICINE & REHABILITATION PROGRESS NOTE  Subjective/Complaints: Patient called 911 three times yesterday saying she was being mistreated here. Discussed with patient and she said she did so because she feels she is being taken advantage of and cannot leave due to her trach.   ROS: + Throat pain.  Denies CP, SOB, N/V/D, +jitteriness this morning prior to receiving methadone. +anxiety.  Objective: Vital Signs: Blood pressure 113/78, pulse 81, temperature 98.2 F (36.8 C), temperature source Oral, resp. rate 18, height 4' 11"  (1.499 m), weight 106 kg, last menstrual period 02/29/2020, SpO2 98 %. No results found. No results for input(s): WBC, HGB, HCT, PLT in the last 72 hours. No results for input(s): NA, K, CL, CO2, GLUCOSE, BUN, CREATININE, CALCIUM in the last 72 hours.  Intake/Output Summary (Last 24 hours) at 03/09/2020 0704 Last data filed at 03/09/2020 0500 Gross per 24 hour  Intake 3089.75 ml  Output --  Net 3089.75 ml        Physical Exam: BP 113/78 (BP Location: Left Arm)   Pulse 81   Temp 98.2 F (36.8 C) (Oral)   Resp 18   Ht 4' 11"  (1.499 m)   Wt 106 kg   LMP 02/29/2020 (Approximate)   SpO2 98%   BMI 47.20 kg/m   Constitutional: No distress . Vital signs reviewed.  Obese. HENT: Normocephalic.  Atraumatic.  + NG. Neck: + Trach with PMV and trach collar, #6 cuffless. Cardio: Reg rate Chest: normal effort, normal rate of breathing Abd: soft, non-distended Ext: no edema Psych: Normal mood.  Anxious about weaning pain meds. Musc: No edema in extremities.  No tenderness in extremities. Neuro: Alert Motor: 4-/5 throughout, unchanged:  Assessment/Plan: 1. Functional deficits which require 3+ hours per day of interdisciplinary therapy in a comprehensive inpatient rehab setting.  Physiatrist is providing close team supervision and 24 hour management of active medical problems listed below.  Physiatrist and rehab team continue to assess barriers  to discharge/monitor patient progress toward functional and medical goals   Care Tool:  Bathing    Body parts bathed by patient: Right arm,Left arm,Chest,Abdomen,Front perineal area,Buttocks,Right upper leg,Left upper leg,Right lower leg,Left lower leg,Face         Bathing assist Assist Level: Contact Guard/Touching assist     Upper Body Dressing/Undressing Upper body dressing   What is the patient wearing?: Bra,Pull over shirt    Upper body assist Assist Level: Supervision/Verbal cueing    Lower Body Dressing/Undressing Lower body dressing      What is the patient wearing?: Pants     Lower body assist Assist for lower body dressing: Minimal Assistance - Patient > 75%     Toileting Toileting    Toileting assist Assist for toileting: Moderate Assistance - Patient 50 - 74%     Transfers Chair/bed transfer  Transfers assist     Chair/bed transfer assist level: Minimal Assistance - Patient > 75%     Locomotion Ambulation   Ambulation assist      Assist level: Minimal Assistance - Patient > 75% Assistive device: Walker-rolling Max distance: 13f   Walk 10 feet activity   Assist     Assist level: Minimal Assistance - Patient > 75% Assistive device: Walker-rolling   Walk 50 feet activity   Assist Walk 50 feet with 2 turns activity did not occur: Safety/medical concerns         Walk 150 feet activity   Assist Walk 150 feet activity did not occur: Safety/medical concerns  Walk 10 feet on uneven surface  activity   Assist Walk 10 feet on uneven surfaces activity did not occur: Safety/medical concerns         Wheelchair     Assist Will patient use wheelchair at discharge?: Yes Type of Wheelchair: Manual Wheelchair activity did not occur: Safety/medical concerns  Wheelchair assist level:  (pt would benefit from [facility] assessment however unable to assess during inital eval 2/2 fatigue and anxiety)      Wheelchair 50  feet with 2 turns activity    Assist    Wheelchair 50 feet with 2 turns activity did not occur: Safety/medical concerns       Wheelchair 150 feet activity     Assist  Wheelchair 150 feet activity did not occur: Safety/medical concerns        Medical Problem List and Plan: 1.  Decreased activity tolerance, hypoxia with activity, generalized weakness with flexed posture, narrow base of support with unsteadiness and LE fatigue with ambulation secondary to CIM.  Continue CIR 2.  Antithrombotics: -DVT/anticoagulation:  Pharmaceutical: Lovenox             -antiplatelet therapy: NA 3. Substance abuse/Pain Management:   On methadone.              Tramadol added on 1/13  Decrease Dilaudid.  On 1/14, continue to wean  Patient would like to be referred to Suboxone clinic outpatient given her history of opioid addiction.   Pain is currently well controlled, continue current wean. 4. Mood: LCSW to follow for evaluation and support.              Will request neuropsych evaluation  See #8             -antipsychotic agents: N/A--Risperdal d/c  5. Neuropsych: This patient is not fully capable of making decisions on her own behalf. 6. Skin/Wound Care: Routine pressure relief measures.  7. Fluids/Electrolytes/Nutrition: Monitor I/O.              BMP within acceptable range on 1/13 8. Anxiety disorder: Question of Bipolar d/o-->patient denies and reports mania due to multiple psych medications.   Does not want to try buspar or klonopin, SSRIs/SNRIs-states that nobody has an adverse reaction to them              See #4 9. VDRF secondary to Covid 19 PNA/sepsis: Last course of antibiotics for MSSA VAP completed 01/02. CFS #6.  10. GERD: Protonix bid not as effective and would like to get back on dexilant 11. Hyperglycemia due to tube feeds: Hgb A1C-5.8.  Mildly elevated to 117 on 1/15- reviewed and has been as high as 142 on 1/9- continue CBG checks. Given obesity, consider discussion  regarding staring metformin 260m daily for diabetes prevention 12. Dysphagia: Continue dysphagia #2, honey liquids. TF adjusted to HS   Started calorie count to monitor intake.   AKI resolved with water flushes--need to encourage thickened fluids intake.   Advance diet as tolerated 13.  Transaminitis  ALT elevated on 1/13, improving  Continue to monitor 14.  Acute blood loss anemia  Hemoglobin 10.4 on 1/13  Continue to monitor 15. Insomnia: patient requests Tramadol at night to help her sleep. She reports restless leg syndrome. Discussed trialing Requip 0.22mHS to help her sleep instead and she is agreeable. 16. Withdrawal symptoms: patient reports jitteriness prior to receiving methadone in the morning, symptoms improve with methadone. She requests increase in Tramadol to help with symptoms. Discussed that this  would worsen her symptoms long term. Encouraged continued slow wean while she is in hospital with [place]. Discussed her desired to leave and what her plans are to obtain pain medication post-discharge. Encouraged follow-up at [facility] clinic and she is agreeable. She would like suboxone to be prescribed here- advised that certification is required to prescribe this medication and she expressed understanding.    LOS: 4 days A FACE TO FACE EVALUATION WAS PERFORMED  Neo Yepiz P Shunda Rabadi 03/09/2020, 7:04 AM

## 2020-03-09 NOTE — Progress Notes (Signed)
Physical Therapy Session Note  Patient Details  Name: Belinda [name] MRN: [id number] Date of Birth: [birth year]  Today's Date: 03/09/2020 PT Individual Time: 1030-1057 and 1345-1442  PT Individual Time Calculation (min): 27 min and 57 min  Short Term Goals: Week 1:  PT Short Term Goal 1 (Week 1): pt to demonstrate supine<>sit supervision PT Short Term Goal 2 (Week 1): pt to demonstrate functional transfers with LRAD CGA consistently PT Short Term Goal 3 (Week 1): pt to demonstrate ambulation with LRAD 50' CGA  Skilled Therapeutic Interventions/Progress Updates:   Treatment Session 1: 1030-1057 27 min Received pt sitting in WC, pt agreeable to therapy, and reported pain 10/10 in anterior throat. RN notified and present to adminster medications. Pt verbose throughout session and required cues to remain on task. Pt also very anxious and requesting pain medication immediately. Session with emphasis on functional mobility/transfers, generalized strengthening, dynamic standing balance/coordination, and improved activity tolerance. Pt on 5L 28% FiO2 trach collar with O2 sat 97%. O2 sat dropped to 83% with activity however, pt denied any SOB and reported the pulse ox is inaccurate sometimes. Pt performed the following exercises standing with BUE support on RW and CGA for balance: -alternating marches 2x10 -heel raises 2x8 and 1x5 (pt with BLEs trembling) but stated "this feels so good" while exercising and requested to perform more reps at end of session -seated LAQ x12 bilaterally -seated hip flexion x10 bilaterally.  Pt required multiple rest breaks throughout session due to fatigue and weakness. Concluded session with pt sitting in WC, needs within reach, and chair pad alarm on. Respiratory present to clean trach.   Treatment Session 2: 8177-1165 79 min Received pt sitting in WC, pt agreeable to therapy, and reported pain in anterior throat but reported feeling much better than this morning and  did not state pain level. Session with emphasis on functional mobility/transfers, generalized strengthening, dynamic standing balance/coordination, ambulation, and improved activity tolerance. Pt on 5L 28% FiO2 trach collar with O2 sat 97% and 6L O2 on portable tank with O2 sat >96% throughout session. Pt transferred supine<>sitting EOB with HOB slightly elevated and use of bedrails with supervision. Pt transferred bed<>WC stand<>pivot with RW and CGA and performed WC mobility 113f using BUE and supervision to therapy gym. Pt ambulated 235fwith RW and CGA/min A with total A for O2 tank management. O2 sat 96% after ambulating. Pt required extensive rest break and with numerous questions regarding COVID and healing timeline. Pt ambulated additional 2071fith RW and CGA/min A. Worked on dynamic standing balance tossing horseshoes with LUE and CGA for balance x 3 trials. Pt reported feeling weak in her arms and legs and with noticeable trembling in BLEs. Pt required multiple extended rest breaks throughout session due to fatigue and poor endurance. Pt reported holding her phone in front of her is very difficult due to her UE weakness. Pt performed toe taps to 6in step 2x10 bilaterally with BUE support and CGA/min A. Pt with decreased R hip flexion ROM with increased fatigue. Stand<>pivot mat<>WC with RW and CGA and pt [facility]. Pt transferred WC<>bed stand<>pivot with RW and sit<>supine with supervision. Concluded session with pt supine in bed, needs within reach, and bed alarm on.   Therapy Documentation Precautions:  Precautions Precautions: Fall Precaution Comments: trach, cortrak, watch HR, monitor BP Restrictions Weight Bearing Restrictions: No  Therapy/Group: Individual Therapy AnnAlfonse Alpers, DPT   03/09/2020, 7:18 AM

## 2020-03-09 NOTE — Progress Notes (Signed)
Occupational Therapy Session Note  Patient Details  Name: Belinda [name] MRN: [id number] Date of Birth: 1980/06/07  Today's Date: 03/09/2020 OT Individual Time: 8182-9937 OT Individual Time Calculation (min): 40 min    Short Term Goals: Week 1:  OT Short Term Goal 1 (Week 1): Pt will complete toileting with supervision OT Short Term Goal 2 (Week 1): Pt will complete UB/LB bathing with supervision OT Short Term Goal 3 (Week 1): Pt will complete toilet transfer with supervision OT Short Term Goal 4 (Week 1): Pt will complete UB/LB dressing with supervision.  Skilled Therapeutic Interventions/Progress Updates:    Pt sitting up in bed upon arrival with RRT present adjusting trach collar. Pt stated her mother assisted her previous night with washing up and changing clothing. Pt requested a diet cola that she could mix with thickner to honey thick consistency. Pt completed task successfully to appropriate consistency. Pt commented that her arms were "so weak." Pt perseverated on health status and became tearful Emotional support provided and therapeutic listening employed. Pt agreeable to BUE therex with unweighted ball. Chest presses and straight arm raises 3x8 each. O2 sats >92% on 6L 28%. Pt remained in bed with all needs within reach and bed alarm activated.  Therapy Documentation Precautions:  Precautions Precautions: Fall Precaution Comments: trach, cortrak, watch HR, monitor BP Restrictions Weight Bearing Restrictions: No Pain: Pain Assessment Pain Scale: 0-10 Pain Score: 4  Pain Type: Acute pain Pain Location: Neck Pain Orientation: Anterior Pain Descriptors / Indicators: Sore Pain Frequency: Constant Pain Onset: On-going Patients Stated Pain Goal: 0 Pain Intervention(s):RRT adjusted trach collar prior to therapy  Therapy/Group: Individual Therapy  Leroy Libman 03/09/2020, 8:44 AM

## 2020-03-09 NOTE — Progress Notes (Signed)
RT in for ATC check. Pt sleeping, SpO2 98% on 28% ATC, no obvious distress noted.

## 2020-03-09 NOTE — Progress Notes (Signed)
Speech Language Pathology Daily Session Note  Patient Details  Name: Belinda [name] MRN: [id number] Date of Birth: 06-Jan-1981  Today's Date: 03/09/2020 SLP Individual Time: 0950-1030 SLP Individual Time Calculation (min): 40 min  Short Term Goals: Week 1: SLP Short Term Goal 1 (Week 1): Paient will participate in repeat MBS to determine swallow function improvements and make diet recommendations SLP Short Term Goal 2 (Week 1): Patient will achieve and maintain adequate vocal intensity at conversational level with PMV in place and minA. SLP Short Term Goal 3 (Week 1): Patient will tolerate Dys 2, honey thick liquids diet without overt s/s aspiration or penetration and with minA cues. SLP Short Term Goal 4 (Week 1): Patient will participate in full evaluation of cognition. SLP Short Term Goal 5 (Week 1): Paitent will recall and demonstrate understanding of precautions, strategies, exercises learned in PT/OT/ST with minA.  Skilled Therapeutic Interventions:  Pt was seen for skilled ST targeting voice goals.  Pt was not wearing valve upon therapist's arrival but was able to donn valve independently and verbalize steps for safe valve placement and removal (ie avoiding twisting valve and/or trach hub).  Pt was fully intelligible in a quiet environment at the conversational level with no cues needed for clarification.  Pt continues to verbalize feeling overwhelmed, tired, frustrated, and anxious in light of prolonged hospitalization.  SLP provided min cues to problem solve strategies to mitigate feelings of anxiety or frustration in the moment.  Pt was left in wheelchair with call bell within reach and chair alarm set.  Continue per current plan of care.    Pain Pain Assessment Pain Scale: 0-10 Pain Score: 8  Pain Type: Acute pain Pain Location: Throat Pain Orientation: Anterior Pain Descriptors / Indicators: Aching Pain Frequency: Constant Pain Onset: On-going Patients Stated Pain Goal:  0 Pain Intervention(s): RN made aware  Therapy/Group: Individual Therapy  Belinda [name], Belinda [name] 03/09/2020, 1:47 PM

## 2020-03-10 ENCOUNTER — Inpatient Hospital Stay (HOSPITAL_COMMUNITY): Payer: Medicaid Other | Admitting: Occupational Therapy

## 2020-03-10 ENCOUNTER — Inpatient Hospital Stay (HOSPITAL_COMMUNITY): Payer: Medicaid Other

## 2020-03-10 ENCOUNTER — Encounter (HOSPITAL_COMMUNITY): Payer: Medicaid Other | Admitting: Psychology

## 2020-03-10 DIAGNOSIS — F064 Anxiety disorder due to known physiological condition: Secondary | ICD-10-CM

## 2020-03-10 DIAGNOSIS — G7281 Critical illness myopathy: Principal | ICD-10-CM

## 2020-03-10 DIAGNOSIS — D62 Acute posthemorrhagic anemia: Secondary | ICD-10-CM

## 2020-03-10 DIAGNOSIS — G6281 Critical illness polyneuropathy: Secondary | ICD-10-CM

## 2020-03-10 LAB — CBC
HCT: 33.2 % — ABNORMAL LOW (ref 36.0–46.0)
Hemoglobin: 11 g/dL — ABNORMAL LOW (ref 12.0–15.0)
MCH: 30.4 pg (ref 26.0–34.0)
MCHC: 33.1 g/dL (ref 30.0–36.0)
MCV: 91.7 fL (ref 80.0–100.0)
Platelets: 344 10*3/uL (ref 150–400)
RBC: 3.62 MIL/uL — ABNORMAL LOW (ref 3.87–5.11)
RDW: 14.2 % (ref 11.5–15.5)
WBC: 5.9 10*3/uL (ref 4.0–10.5)
nRBC: 0 % (ref 0.0–0.2)

## 2020-03-10 LAB — URINALYSIS, ROUTINE W REFLEX MICROSCOPIC
Bilirubin Urine: NEGATIVE
Glucose, UA: NEGATIVE mg/dL
Hgb urine dipstick: NEGATIVE
Ketones, ur: NEGATIVE mg/dL
Leukocytes,Ua: NEGATIVE
Nitrite: NEGATIVE
Protein, ur: NEGATIVE mg/dL
Specific Gravity, Urine: 1.027 (ref 1.005–1.030)
pH: 5 (ref 5.0–8.0)

## 2020-03-10 LAB — BASIC METABOLIC PANEL
Anion gap: 12 (ref 5–15)
BUN: 8 mg/dL (ref 6–20)
CO2: 25 mmol/L (ref 22–32)
Calcium: 9 mg/dL (ref 8.9–10.3)
Chloride: 98 mmol/L (ref 98–111)
Creatinine, Ser: 0.62 mg/dL (ref 0.44–1.00)
GFR, Estimated: >60 mL/min (ref >60)
Glucose, Bld: 106 mg/dL — ABNORMAL HIGH (ref 70–99)
Potassium: 3.9 mmol/L (ref 3.5–5.1)
Sodium: 135 mmol/L (ref 135–145)

## 2020-03-10 LAB — GLUCOSE, CAPILLARY
Glucose-Capillary: 106 mg/dL — ABNORMAL HIGH (ref 70–99)
Glucose-Capillary: 107 mg/dL — ABNORMAL HIGH (ref 70–99)
Glucose-Capillary: 69 mg/dL — ABNORMAL LOW (ref 70–99)
Glucose-Capillary: 76 mg/dL (ref 70–99)
Glucose-Capillary: 79 mg/dL (ref 70–99)
Glucose-Capillary: 97 mg/dL (ref 70–99)

## 2020-03-10 MED ORDER — FUROSEMIDE 20 MG PO TABS
10.0000 mg | ORAL_TABLET | Freq: Every day | ORAL | Status: DC
  Administered 2020-03-11 – 2020-03-18 (×8): 10 mg via ORAL
  Filled 2020-03-10 (×8): qty 1

## 2020-03-10 MED ORDER — PROSOURCE TF PO LIQD
45.0000 mL | Freq: Three times a day (TID) | ORAL | Status: DC
  Filled 2020-03-10 (×7): qty 45

## 2020-03-10 MED ORDER — JEVITY 1.5 CAL/FIBER PO LIQD
960.0000 mL | ORAL | Status: DC
  Administered 2020-03-10: 960 mL
  Filled 2020-03-10 (×4): qty 1000

## 2020-03-10 NOTE — Progress Notes (Signed)
Pt blood sugar 69 at 8 pm. She was given a soda but refused to dri nk the soda. She stated it makes her nauseous. Pt was given pudding but could only take a few bites bc she was full. Tube feed ordered to start at 7pm. Pt stated she did not want the tube feed bc she ate all day already. This nurse educated pt on need of tube feed. She agreed. Blood sugar 76 at 830. Pt refused requip, mouthwashes, temovate, and prosource. Pt was educated on each medications need.

## 2020-03-10 NOTE — Progress Notes (Signed)
Pt stated she did not want to get her tube feed this evening. She was educated on the need for her tube feed. It was discontinued and her 12am blood sugar was 79. This nurse educated pt on need for feed once again and she complied. No other concerns to report.

## 2020-03-10 NOTE — Progress Notes (Addendum)
Calorie Count Note  48 hour calorie count ordered.  Diet: dysphagia 2 with honey thick liquids Supplements: Magic cup TID with meals, each supplement provides 290 kcal and 9 grams of protein  Day 1 (03/06/20): Breakfast: 0 kcal, 0 grams of protein (pt did not eat breakfast) Lunch: 235 kcal, 6 grams of protein Dinner: 94 kcal, 6 grams of protein  Day 1 total 24-hour intake: 329 kcal (15% of minimum estimated needs)  12 grams of protein (11% of minimum estimated needs)  03/07/20 Breakfast: 0 kcals, 0 grams protein Lunch: 0 kcals, 0 grams protein Dinner: 100 kcals, 2 grams protein  Total intake: 100 kcal (5% of minimum estimated needs)  2 grams protein (2% of minimum estimated needs)  03/08/20 Breakfast: 20 kcals, 0 grams protein Lunch:51 kcals, 1 gram protein Dinner: 0 kcals, 0 grams protein  Total intake: 71 kcal (3% of minimum estimated needs)  1 gram protein (0% of minimum estimated needs)  03/09/20 Breakfast: 0 kcals, 0 grams protein Lunch:583 kcals, 23 grams protein Dinner: 0 kcals, 0 grams protein  Total intake: 583 kcal (27% of minimum estimated needs)  23 grams protein (21% of minimum estimated needs)  Average Total intake: 271 kcal (12% of minimum estimated needs)  10 grams protein (9% of minimum estimated needs)  Nutrition Dx: Inadequate oral intake related to decreased appetite,dysphagia as evidenced by meal completion < 25%; ongong  Goal: Patient will meet greater than or equal to 90% of their needs; progressing   Intervention:   -D/c calorie count -Continue nocturnal TF:   Initiate Jevity 1.5 @ 80 ml/hr via cortrak tube x 12 hours  45 ml Prostource TF TID    Tube feeding regimen provides 1560 kcal (70% of needs), 94 grams of protein, and 730 ml of H2O.   Loistine Chance, RD, LDN, [place] Registered Dietitian II Certified Diabetes Care and Education Specialist Please refer to [facility] for RD and/or RD on-call/weekend/after hours pager

## 2020-03-10 NOTE — Progress Notes (Signed)
Occupational Therapy Session Note  Patient Details  Name: Belinda [name] MRN: [id number] Date of Birth: [birth year]  Today's Date: 03/10/2020 OT Individual Time: 1015-1100 OT Individual Time Calculation (min): 45 min    Short Term Goals: Week 1:  OT Short Term Goal 1 (Week 1): Pt will complete toileting with supervision OT Short Term Goal 2 (Week 1): Pt will complete UB/LB bathing with supervision OT Short Term Goal 3 (Week 1): Pt will complete toilet transfer with supervision OT Short Term Goal 4 (Week 1): Pt will complete UB/LB dressing with supervision.  Skilled Therapeutic Interventions/Progress Updates:    Pt received sitting in w/c with c/o her throat hurting- RN aware. Pt's continuous pulse ox machine was alarming of battery error. Attempted to problem solve fixing it but ended up having to ask RN/NT to order a new one to ensure accurate read. Pt declined ADLs and had several questions re temperature regulation and sweating, initially stating she thought menopause was beginning but discussed possible COVID side effect on temperature regulation. Pt agreeable to oral care at the sink. She had several questions re safety of brushing her teeth, with reports of swishing water causing pressure in her ears. Provided several recommendations. Also discussed trach humidity and Spo2 as pt thought she could not remove humidified air. Extensive education re trach care, PMSV, capping, and humidified air. Pt stood from her w/c with CGA and completed guided AROM ranging for her reported L supraspinatus pain, which she reported relief from. Pt was passed off to SLP in room.   Therapy Documentation Precautions:  Precautions Precautions: Fall Precaution Comments: trach, cortrak, watch HR, monitor BP Restrictions Weight Bearing Restrictions: No   Therapy/Group: Individual Therapy  Curtis Sites 03/10/2020, 6:24 AM

## 2020-03-10 NOTE — Progress Notes (Signed)
Patient feeling better today.  Still c/o anxiety and pain.  Patient is concerned about her trach having an infection.  Patient stated, "Respiratory said that I might have an infection at my trach.  He said that the doctor should get a swab."  I let patient know that I will let Dr. Posey Pronto know and he will have to put an order in for use to do a swab.  Patient stated, " I understand that."  RN sent a message to Dr. Posey Pronto regarding the patients concern asking him to put in an order if he would like a swab to be done.

## 2020-03-10 NOTE — Consult Note (Signed)
Neuropsychological Consultation   Patient:   Belinda [name]   DOB:   August 04, 1980  MR Number:  [id number]  Location:  [facility] [address] C[facility] [address] [place] [id number] [place] [id number] Dept: [place]: [phone]           Date of Service:   03/10/2020  Start Time:   9 AM End Time:   10 AM  Provider/Observer:  Ilean Skill, Psy.D.       Clinical Neuropsychologist       Billing Code/Service: [phone]  Chief Complaint:    Belinda [name] is a 40 year old female with history of anxiety disorder, morbid obesity, GERD.  Patient admitted on 01/27/2020 through [facility] with hypoxia, confusion and delirium and hypotension due to COVID-19 pneumonia.  Patient was sedated due to agitation, intubated and treated with COVID protocol.  Hospital course was significant with agitation and combativeness.  Patient has a long history of Suboxone use prior to infection as well as significant benzo diazepam regimen.  Urine drug screen was positive for benzos and opiates.  Patient was started on methadone due to concerns of drug withdrawal 02/16/2020.  Cognition has been improving various efforts to wean off and change of the/methadone have been tried.  Xanax has been resumed per home regimen.  Reason for Service:  Patient was referred for neuropsychological consultation due to coping and adjustment.  Patient has significant anxiety and has been followed by her PCP in [place] for issues related to anxiety disorder for some time.  See HPI for the current mission.  HPI: Belinda [name]. Belinda [name] is a 40 year old female with history of anxiety d/o, morbid obesity, GERD who was admitted on 01/27/20 via [facility] with hypoxia, confusion and delirium wht hypotension due to Covid 19 PNA.  History taken from chart review and patient due to cognition.  Patient was sedated with precedex due to agitation, intubated and treated with Covid  protocol-Remdesiver, barcitinib, Solumedrol and antibiotics. Hospital course significant for issues with agitation and combativeness (required fentanyl, Precedex, ketamine, versed)  as well as worsening of respiratory status due to ARDS requiring intubation. CT head unremarkable for acute intracranial process on 12/15 and again on 12/27.  UDS positive for benzo's and opiates. She was started on methadone on 02/16/2020 due to concerns of drug withdrawal and on 12/26 family (confirmed with patient) reported that patient had been taking her fianc's Suboxone for the past year and ( hx of accidental ibuprofen/ETOH OD in 2007--question "another substance" per mother)  Hospital course further complicated by acute lower UTI.  Klebsiella UTI with sepsis treated with Unasyn.  Neurology consulted for input on refractory delirium and encephalopathy, which was felt to be multifactorial. EEG done negative for seizures and showed severe diffuse encephalopathy.  She developed ARDS with CAP v/s aspiration and underwent tracheostomy on 02/19/2020 by Dr. Tamala Julian.  She was being weaned to ATC and on 02/25/2020 found to have disconjugate gaze with question of Left> right sided weakness.  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  QTc being monitored while on methadone with as needed Haldol.  Mentation improving and scheduled hydromorphone and methadone being weaned off. Risperdal DC'd due to concerns of tremors/jerks. Xanax resumed per home regimen but she was reporting  diarrhea with  attempts to wean dilaudid and increased to 1 mg tid and methadone increased to 10 mg bid on 03/04/2020 fluid overload treated with intermittent IV diuresis and now on oral lasix. Tolerating dysphagia 2 with  honey liquids but continues on nocturnal tube feeds due to poor intake. PCCM following for input and recommended downsize to cuffless #6 but patient wanted to wait until today due to anxiety. Therapy has been ongoing and patient limited by decreased activity tolerance, hypoxia with activity,  weakness with flexed posture, narrow base of support with unsteadiness and LE fatigue with ambulation. CIR recommended due to functional deficits in mobility and ADLs.  Please see preadmission assessment from earlier today as well.  Current Status:  Patient was awake and alert upon entering the room with significant anxiety and some limited understanding around her substance use in the past.  Patient admits to taking Suboxone for some time to be able to stay away from other opiates and avoid withdrawals.  Patient describes significant anxiety when she is without her Suboxone and continues to take her anxiety.  Patient reports a lot of anxiety around her continued hospitalization and concern about returning home.  Behavioral Observation: Belinda [name]  presents as a 40 y.o.-year-old Right Caucasian Female who appeared her stated age. her dress was Appropriate and she was Well Groomed and her manners were Appropriate to the situation.  her participation was indicative of Appropriate and Redirectable behaviors.  There were physical disabilities noted.  she displayed an appropriate level of cooperation and motivation.     Interactions:    Active Appropriate and Redirectable  Attention:   abnormal and attention span appeared shorter than expected for age  Memory:   within normal limits; recent and remote memory intact  Visuo-spatial:  not examined  Speech (Volume):  normal  Speech:   normal; normal  Thought Process:  Tangential  Though Content:  WNL; not suicidal and not homicidal  Orientation:   person, place, time/date and  situation  Judgment:   Fair  Planning:   Poor  Affect:    Anxious  Mood:    Anxious  Insight:   Fair  Intelligence:   low  Substance Use:  There is a documented history of prescription drug abuse confirmed by the patient.    Medical History:   Past Medical History:  Diagnosis Date  . Anxiety    compulsive worring, phobia  . Anxiety   . Anxiety disorder   . Asthma   . Bipolar affective ([place])   . Fatty liver 11/14/2018  . Fatty liver   . GAD (generalized anxiety disorder)    with panic attacks  . GERD (gastroesophageal reflux disease)   . Morbid obesity ([place])   . Narcotic addiction ([place])   . Narcotic drug use   . OCD (obsessive compulsive disorder)   . Psoriasis   . Reactive airways dysfunction syndrome ([place])   . Vaginal Pap smear, abnormal          Patient Active Problem List   Diagnosis Date Noted  . Chronic pain syndrome   . Substance abuse ([place])   . Acute blood loss anemia   . Transaminitis   . 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 Debility 03/05/2020  . Status post tracheostomy ([place])   . Critical illness myopathy   . Dysphagia   . Acute respiratory failure ([place])   . Sepsis ([place]) 02/07/2020  . Metabolic encephalopathy 87/56/4332  . Hypokalemia 02/07/2020  . Hypoalbuminemia 02/07/2020  .  Elevated liver enzymes 02/07/2020  . COVID-19 02/07/2020  . Pneumonia due to COVID-19 virus 02/06/2020  . Bloating 11/07/2019  . Fatty liver 11/14/2018  . Abdominal pain, epigastric 06/29/2018  . Constipation 06/29/2018  . Elevated TSH 04/24/2018  . Weight loss counseling, encounter for 04/24/2018  . Body mass index 40.0-44.9, adult ([place]) 04/24/2018  . Encounter for gynecological examination with Papanicolaou smear of cervix 03/16/2018  . Encounter for initial prescription of contraceptive pills 03/16/2018  . Smoker 03/16/2018  . History of abnormal cervical Pap smear 03/16/2018  . Screening examination for STD (sexually transmitted disease) 03/16/2018  .  Weight gain 03/16/2018  . OCD (obsessive compulsive disorder) 09/01/2015  . GERD (gastroesophageal reflux disease) 03/24/2015  . Morbid obesity ([place]) 03/24/2015  . Hyperglycemia 03/24/2015  . Hypertriglyceridemia 03/24/2015  . Obesity 01/07/2014  . 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 Generalized anxiety disorder 05/17/2012  . Reactive airway disease 05/12/2012  . Psoriasis 05/12/2012         Psychiatric History:  Patient with significant history of anxiety, substance abuse and chronic pain symptoms.  Family Med/Psych History:  Family History  Problem Relation Age of Onset  . High blood pressure Father   . Stroke Father   . High blood pressure Brother   . Hypertension Father   . Heart attack Father   . Heart disease Maternal Grandmother   . Hypertension Brother   . Breast cancer Maternal Aunt   . Colon cancer Neg Hx     Risk of Suicide/Violence: low patient denies any suicidal or homicidal ideation.  She does have a history of reported accidental overdose with alcohol and over-the-counter medications in the past but denies it was an attempt to harm herself.  Impression/DX:  Belinda [name] is a 40 year old female with history of anxiety disorder, morbid obesity, GERD.  Patient admitted on 01/27/2020 through [facility] with hypoxia, confusion and delirium and hypotension due to COVID-19 pneumonia.  Patient was sedated due to agitation, intubated and treated with COVID protocol.  Hospital course was significant with agitation and combativeness.  Patient has a long history of Suboxone use prior to infection as well as significant benzo diazepam regimen.  Urine drug screen was positive for benzos and opiates.  Patient was started on methadone due to concerns of drug withdrawal 02/16/2020.  Cognition has been improving various efforts to wean off and change of the/methadone have been tried.  Xanax has been resumed per home regimen.  Patient was awake and alert upon entering the room  with significant anxiety and some limited understanding around her substance use in the past.  Patient admits to taking Suboxone for some time to be able to stay away from other opiates and avoid withdrawals.  Patient describes significant anxiety when she is without her Suboxone and continues to take her anxiety.  Patient reports a lot of anxiety around her continued hospitalization and concern about returning home.  Disposition/Plan:  Today we worked on coping and adjustment with significant anxiety symptoms and her recovery post COVID.  I will follow-up with patient later this week.          Electronically Signed   _______________________ Ilean Skill, Psy.D. Clinical Neuropsychologist

## 2020-03-10 NOTE — Progress Notes (Addendum)
Speech Language Pathology Daily Session Note  Patient Details  Name: Belinda [name] MRN: [id number] Date of Birth: 1981/01/07  Today's Date: 03/10/2020 SLP Individual Time: 1102-1200 SLP Individual Time Calculation (min): 58 min  Short Term Goals: Week 1: SLP Short Term Goal 1 (Week 1): Paient will participate in repeat MBS to determine swallow function improvements and make diet recommendations SLP Short Term Goal 2 (Week 1): Patient will achieve and maintain adequate vocal intensity at conversational level with PMV in place and minA. SLP Short Term Goal 3 (Week 1): Patient will tolerate Dys 2, honey thick liquids diet without overt s/s aspiration or penetration and with minA cues. SLP Short Term Goal 4 (Week 1): Patient will participate in full evaluation of cognition. SLP Short Term Goal 5 (Week 1): Paitent will recall and demonstrate understanding of precautions, strategies, exercises learned in PT/OT/ST with minA.  Skilled Therapeutic Interventions:Skilled ST services focused on education and swallow skills. OT handed off pt to SLP. Pt was explaining OT educating pt that she is currently on humidifier air and it can be removed while completing tasks. Pt had thickened diet coke to honey thick liquid, however the ice cubes still remained in the mixture. SLP provided education and demonstration how to cool liquids, remove ice and then thicken liquids. SLP thickened new cup of diet coke to honey thick texture. Pt consumed with no overt s/s aspiration. Cirtical care MD entered room and communicated with pt about plan to cap trach once NG tube has been removed and repeat MBS is completed. Pt refused dys 2 or dys 3 snack due to discomfort masticating without upper dentition and fear of swallowing large/harder textures. Pt expressed only consuming dys 1 textures on tray. SLP educated pt on functional swallow in clearing up to dys 3 textures on most recent MBS, reviewing x-ray slide, howe pt continued  to express anxeity pertaining to reduced ability to masticate chopped versus pureed textures. SLP downgraded diet to dys 1 to increase PO intake (which is needed to remove NG tube) and increase readiness for repeat MBS which was recommended after NG tube removal. SLP provided education and pt agreed. All questions answered to satisfaction. Pt was tolerating PMSV during session with O2 remaining WFL. Pt was left in room with call bell within reach and chair alarm set. SLP recommends to continue skilled services.     Pain Pain Assessment Pain Scale: 0-10 Pain Score: 0-No pain Faces Pain Scale: Hurts little more Pain Type: Acute pain Pain Location: Throat Pain Orientation: Anterior Pain Descriptors / Indicators: Sore;Throbbing Pain Frequency: Constant Pain Onset: On-going Patients Stated Pain Goal: 2 Pain Intervention(s): Medication (See eMAR) Multiple Pain Sites: No  Therapy/Group: Individual Therapy  Belinda [name]  [facility] 03/10/2020, 12:24 PM

## 2020-03-10 NOTE — Progress Notes (Addendum)
[place] PHYSICAL MEDICINE & REHABILITATION PROGRESS NOTE  Subjective/Complaints:  Pt requesting to be toileted, "I feel like I will pee in my pants" Had UTI early acute care hospital On Lasix, normal Ej fx  Pt would like to go on suboxone rather than methadone in hospital, we discussed this may not be an option, but willinquire  ROS: urinary urgency +anxiety.  Objective: Vital Signs: Blood pressure 112/77, pulse 99, temperature 98.4 F (36.9 C), temperature source Oral, resp. rate 18, height 4' 11"  (1.499 m), weight 106 kg, last menstrual period 02/29/2020, SpO2 94 %. No results found. Recent Labs    03/10/20 0453  WBC 5.9  HGB 11.0*  HCT 33.2*  PLT 344   Recent Labs    03/10/20 0453  NA 135  K 3.9  CL 98  CO2 25  GLUCOSE 106*  BUN 8  CREATININE 0.62  CALCIUM 9.0    Intake/Output Summary (Last 24 hours) at 03/10/2020 0912 Last data filed at 03/09/2020 2303 Gross per 24 hour  Intake 370 ml  Output --  Net 370 ml        Physical Exam: BP 112/77 (BP Location: Left Arm)   Pulse 99   Temp 98.4 F (36.9 C) (Oral)   Resp 18   Ht 4' 11"  (1.499 m)   Wt 106 kg   LMP 02/29/2020 (Approximate)   SpO2 94%   BMI 47.20 kg/m   Constitutional: No distress . Vital signs reviewed.  Obese. HENT: Normocephalic.  Atraumatic.  + NG. Neck: + Trach with PMV and trach collar, #6 cuffless. Cardio: Reg rate Chest: normal effort, normal rate of breathing Abd: soft, non-distended Ext: no edema Psych: Normal mood.  Anxious about weaning pain meds. Musc: No edema in extremities.  No tenderness in extremities. Neuro: Alert, reduced LT sensatin in toes  Motor: 5/5 in BUE, 4/5 quads , trace ankle DF  Assessment/Plan: 1. Functional deficits which require 3+ hours per day of interdisciplinary therapy in a comprehensive inpatient rehab setting.  Physiatrist is providing close team supervision and 24 hour management of active medical problems listed below.  Physiatrist and  rehab team continue to assess barriers to discharge/monitor patient progress toward functional and medical goals   Care Tool:  Bathing    Body parts bathed by patient: Right arm,Left arm,Chest,Abdomen,Front perineal area,Buttocks,Right upper leg,Left upper leg,Right lower leg,Left lower leg,Face         Bathing assist Assist Level: Contact Guard/Touching assist     Upper Body Dressing/Undressing Upper body dressing   What is the patient wearing?: Bra,Pull over shirt    Upper body assist Assist Level: Supervision/Verbal cueing    Lower Body Dressing/Undressing Lower body dressing      What is the patient wearing?: Pants     Lower body assist Assist for lower body dressing: Minimal Assistance - Patient > 75%     Toileting Toileting    Toileting assist Assist for toileting: Moderate Assistance - Patient 50 - 74%     Transfers Chair/bed transfer  Transfers assist     Chair/bed transfer assist level: Minimal Assistance - Patient > 75%     Locomotion Ambulation   Ambulation assist      Assist level: Minimal Assistance - Patient > 75% Assistive device: Walker-rolling Max distance: 73f   Walk 10 feet activity   Assist     Assist level: Minimal Assistance - Patient > 75% Assistive device: Walker-rolling   Walk 50 feet activity   Assist Walk 50 feet  with 2 turns activity did not occur: Safety/medical concerns         Walk 150 feet activity   Assist Walk 150 feet activity did not occur: Safety/medical concerns         Walk 10 feet on uneven surface  activity   Assist Walk 10 feet on uneven surfaces activity did not occur: Safety/medical concerns         Wheelchair     Assist Will patient use wheelchair at discharge?: Yes Type of Wheelchair: Manual Wheelchair activity did not occur: Safety/medical concerns  Wheelchair assist level:  (pt would benefit from [facility] assessment however unable to assess during inital eval 2/2 fatigue  and anxiety)      Wheelchair 50 feet with 2 turns activity    Assist    Wheelchair 50 feet with 2 turns activity did not occur: Safety/medical concerns       Wheelchair 150 feet activity     Assist  Wheelchair 150 feet activity did not occur: Safety/medical concerns        Medical Problem List and Plan: 1.  Decreased activity tolerance, hypoxia with activity, generalized weakness with flexed posture, narrow base of support with unsteadiness and LE fatigue with ambulation secondary to Critical illness neuropathy +/- CIM.  Continue CIR 2.  Antithrombotics: -DVT/anticoagulation:  Pharmaceutical: Lovenox             -antiplatelet therapy: NA 3. Substance abuse/Pain Management:   On methadone.              Tramadol added on 1/13  Decrease Dilaudid.  On 1/14, continue to wean  Patient would like to be referred to Suboxone clinic outpatient given her history of opioid addiction.   Pain is currently well controlled, continue current wean. 4. Mood: LCSW to follow for evaluation and support.              Will request neuropsych evaluation  See #8             -antipsychotic agents: N/A--Risperdal d/c  5. Neuropsych: This patient is not fully capable of making decisions on her own behalf. 6. Skin/Wound Care: Routine pressure relief measures.  7. Fluids/Electrolytes/Nutrition: Monitor I/O.              BMP within acceptable range on 1/13 8. Anxiety disorder: Question of Bipolar d/o-->patient denies and reports mania due to multiple psych medications.   Does not want to try buspar or klonopin, SSRIs/SNRIs-states that nobody has an adverse reaction to them              See #4 9. VDRF secondary to Covid 19 PNA/sepsis: Last course of antibiotics for MSSA VAP completed 01/02. CFS #6. DIscussed vaccination post hospitalization  10. GERD: Protonix bid not as effective and would like to get back on dexilant 11. Hyperglycemia due to tube feeds: Hgb A1C-5.8.  Mildly elevated to 117 on  1/15- reviewed and has been as high as 142 on 1/9- continue CBG checks. Given obesity, consider discussion regarding staring metformin 232m daily for diabetes prevention 12. Dysphagia: Continue dysphagia #2, honey liquids. TF adjusted to HS   Started calorie count to monitor intake.   AKI resolved with water flushes--need to encourage thickened fluids intake.   Advance diet as tolerated 13.  Transaminitis  ALT elevated on 1/13, improving  Continue to monitor 14.  Acute blood loss anemia  Hemoglobin 10.4 on 1/13  Continue to monitor 15. Insomnia: patient requests Tramadol at night to help her  sleep. She reports restless leg syndrome. Discussed trialing Requip 0.7m HS to help her sleep instead and she is agreeable. 16. Withdrawal symptoms: patient reports jitteriness prior to receiving methadone in the morning, symptoms improve with methadone. She requests increase in Tramadol to help with symptoms. Discussed that this would worsen her symptoms long term. Encouraged continued slow wean while she is in hospital [place] Discussed her desired to leave and what her plans are to obtain pain medication post-discharge. [facility]. She would like suboxone to be prescribed here- advised that certification is required to prescribe this medication and she expressed understanding.  17.  Urinary urgency- recheck UA C and S , reduce lasix to 148m  LOS: 5 days A FACE TO FACE EVALUATION WAS PERFORMED  AnCharlett Blake/17/2022, 9:12 AM

## 2020-03-10 NOTE — Progress Notes (Signed)
Physical Therapy Session Note  Patient Details  Name: Belinda [name] MRN: [id number] Date of Birth: December 04, 1980  Today's Date: 03/10/2020 PT Individual Time: 5621-3086 PT Individual Time Calculation (min): 43 min   Short Term Goals: Week 1:  PT Short Term Goal 1 (Week 1): pt to demonstrate supine<>sit supervision PT Short Term Goal 2 (Week 1): pt to demonstrate functional transfers with LRAD CGA consistently PT Short Term Goal 3 (Week 1): pt to demonstrate ambulation with LRAD 50' CGA  Skilled Therapeutic Interventions/Progress Updates:    Pt received long sitting in bed, awake and agreeable to therapy session. RN present for morning medications. Pt reports no pain but does endorse she has had anterior throat discomfort prior to arrival. NP arriving shortly thereafter, explaining trach progressions and weaning process as well as feeding tube weaning. Pt becoming quite anxious about all of this and benefits from calming techniques and redirection. Long sitting to short sitting EOB with supervision. Stand<>pivot with CGA from EOB to w/c. Pt connected to portable tank 6L with trach collar adapter. Resting O2 99% with HR105.  W/c transpor tto main therapy gym for time management.  Gait training 2x15f (seated rest) with CGA and RW while on portable O2. Oxygen reading 98% with HR 115 after gait. Gait deficits including short shuffling steps with heavy reliance of BUE through RW. No knee buckling or LOB during gait trials but gait speed significantly reduced.   Dynamic standing balance with rebounder using light ball, focusing on BUE coordination, reaction time, and balance. Initially she was fearful of this activity but with encouragement, she completed this quite well and surprised herself.   Pt remained seated in w/c at end of session with chair alarm on and reconnected to wall O2. All needs within reach.  Therapy Documentation Precautions:  Precautions Precautions: Fall Precaution  Comments: trach, cortrak, watch HR, monitor BP Restrictions Weight Bearing Restrictions: No General:    Therapy/Group: Individual Therapy  Markee Matera P Etana Beets  PT 03/10/2020, 7:35 AM

## 2020-03-10 NOTE — Progress Notes (Signed)
NAME:  Belinda [name], MRN:  [id number], DOB:  09/30/1980, LOS: 5 ADMISSION DATE:  03/05/2020, CONSULTATION DATE:  12/16 REFERRING MD:  Dr Broadus John, CHIEF COMPLAINT:  Tracheostomy status   Brief History:  40 year old female s/p long ICU course due to COVID-19 ARDS. Now she is status post tracheostomy and has been liberated from the mechanical ventilator. PCCM has been consulted for ongoing trach management.   Past Medical History:   has a past medical history of Anxiety, Anxiety, Anxiety disorder, Asthma, Bipolar affective ([place]), Fatty liver (11/14/2018), Fatty liver, GAD (generalized anxiety disorder), GERD (gastroesophageal reflux disease), Morbid obesity ([place]), Narcotic addiction ([place]), Narcotic drug use, OCD (obsessive compulsive disorder), Psoriasis, Reactive airways dysfunction syndrome ([place]), and Vaginal Pap smear, abnormal.  Significant Hospital Events:  12/15: presented to the emergency room, hypoxic, encephalopathic, Covid positive. Started on supplemental oxygen, IV Solu-Medrol, IV remdesivir, and baricitinib.  Also started empirically on cefepime  and vancomycin to cover for potential bacterial pneumonia 12/16: multiple reports of worsening confusion, intermittent combativeness, attempts to bite staff.  Not able to take oral medications.  Started on Precedex. Transferred to [facility]. Changed abx to unasyn, added low dose ativan as pt chronically on xanax.  12/17: Patient more hypoxic, requiring more sedation. Decision made to proceed with intubation.  Intubated, left IJ catheter placed.  PF ratio only 88.  Core track ordered.  Neuromuscular blockade initiated.  Prone protocol initiated. 12/19: No overnight events but this morning hypertensive and tachycardic. EKG personally reviewed, sinus tachycardia with normal axis. BIS values are low in the 30s. She is still on continuous paralytics.  12/20: neuromuscular blockade discontinued yesterday.  At this point in time patient is extremely agitated  on the ventilator despite Dilaudid infusion Versed infusion, oxycodone schedule, clonopoin scheduloed,  and phenobarb once daily at night.  She is on prednisone and Barcitinib. On vent 50%. AFebrikle 12/21: cxr horrible. 40% fio2, pulse ox 90%,.  On dilaudid gtt, oxycdone po, precedex gtt, versed gt and klonopin. On phenobarb QHS x 2 night.  On haldol schedulesd x 1 dayt.   -. RASS -4 but easily goes to +2 per RN. On TF.  K 2.9 while on 80mkcl daily. + 4L volume overloa 12/22 - 40% fio2. On dilaudid gtt, [precedex gtt, versed gtt. On oxy and klonopin. Still very agitated On TF. -2L since admit 12/23 -> stil with significant intermittent agitation. On 30% fio2, Now on diprivan gtt, precedex gtt, fent gtt, versed gtt, levophed gtt. On vent, 30% fio2 12/24  ? Low grade fever. 30% fio2 on vent. On fent gtt, prededex gtt, ketamine gtt, versed gtt . Also on levophed gtt. Some breakthrough agitation per RN but much better overall. Off diprivan gtt 12/26: On fent gtt, precedex gtt, ketamnie gtt, versed gtt. On TF. On free water.  Low grade fever + . On vent  - 30 12/28:Tracheostomy-paralytics off 1/12: Discharged to Inpatient rehab, t#6 shiley cuffed trach replaced by #6 cuffless shiley  Consults:    Procedures:  Intubation 12/17 > 12/28 Left IJ triple-lumen catheter 12/17 > 1/4 Trach (DS) 12/28 >>   Significant Diagnostic Tests:    Micro Data:    Antimicrobials:     Interim History / Subjective:  Sitting up on side of bed taking AM meds PT/OT at bedside to assist in ambulation  Reports anxiety at the idea of trach capping trials   Objective   Blood pressure 112/77, pulse 99, temperature 98.4 F (36.9 C), temperature source Oral, resp. rate 18,  height 4' 11"  (1.499 m), weight 106 kg, last menstrual period 02/29/2020, SpO2 94 %.    FiO2 (%):  [28 %] 28 %   Intake/Output Summary (Last 24 hours) at 03/10/2020 0743 Last data filed at 03/09/2020 2303 Gross per 24 hour  Intake 420 ml   Output --  Net 420 ml   Filed Weights   03/05/20 1557 03/09/20 0413  Weight: 105.2 kg 106 kg    Examination:  General: Pleasant slightly anxious adult female sitting up on edge of bed in NAD HEENT: [place]/AT, cortrack tube in place, MM pink/moist, PERRL,  Neuro: Alert and oriented x4, non-focal  CV: s1s2 regular rate and rhythm, no murmur, rubs, or gallops,  PULM:  Clear to ascultation bilaterally, no added breath sounds, no increased work of breathing, PM valve in place GI: soft, bowel sounds active in all 4 quadrants, non-tender, non-distended, nocturnal tube feeds Extremities: warm/dry, no edema  Skin: no rashes or lesions  Assessment & Plan:   COVID-19 ARDS no status post tracheostomy  -#6 cuff placed 12/28 downsized to cuffles #6 shiley trach 1/12 - No current obstructions at this time to work towards decannulation in the coming week or two.  Deconditioning  P: Continue to progress towards downsizing/decanultation Underlying anxiety may make capping trial difficult  Aggressive PT/OT Continue supplemental oxygen as needed  Routine trach care  Continue to encourage oral diet  Consider nocturnal feeds only to encourage more oral intake   Best practice (evaluated daily)  Diet: Per Primary Pain/Anxiety/Delirium protocol (if indicated): NA VAP protocol (if indicated): NA DVT prophylaxis: Per Primary GI prophylaxis: Per Primary Glucose control: Per Primary Mobility: Per Primary Disposition:PCU  Goals of Care:  Code Status: FULL  Johnsie Cancel, NP-C [facility] Contact / Pager information can be found on Amion  03/10/2020, 7:57 AM

## 2020-03-10 NOTE — Progress Notes (Signed)
Occupational Therapy Session Note  Patient Details  Name: Belinda [name] MRN: [id number] Date of Birth: 1980-09-18  Today's Date: 03/10/2020 OT Individual Time: 4734-0370 OT Individual Time Calculation (min): 48 min    Short Term Goals: Week 1:  OT Short Term Goal 1 (Week 1): Pt will complete toileting with supervision OT Short Term Goal 2 (Week 1): Pt will complete UB/LB bathing with supervision OT Short Term Goal 3 (Week 1): Pt will complete toilet transfer with supervision OT Short Term Goal 4 (Week 1): Pt will complete UB/LB dressing with supervision.  Skilled Therapeutic Interventions/Progress Updates:    Pt sidelying in bed, reports she would like to defer self care this session and complete tomorrow.  Pt c/o pain and soreness in left side of neck.  Soft tissue massage with trigger point release performed to left levator scapulae and upper traps to increase normal soft tissue mobility with reduced pain. Pt completed sidelying to sit and stand pivot transfer EOB to w/c with stand by assist for management of lines/tubes.  Pt agreeable to complete BUE therex and performed biceps curls, overhead triceps press, and chest press seated in w/c 2 x 12 reps of each with intermittent VCs for positioning and deep breathing.  Pt requesting back to bed at end of session and completed stand pivot w/c to EOB and sit to supine with standby assist.  Call bell in reach, bed alarm on.  Therapy Documentation Precautions:  Precautions Precautions: Fall Precaution Comments: trach, cortrak, watch HR, monitor BP Restrictions Weight Bearing Restrictions: No   Therapy/Group: Individual Therapy  Ezekiel Slocumb 03/10/2020, 3:20 PM

## 2020-03-10 NOTE — Progress Notes (Signed)
Pt refusing tube feed. She was educated on the need for the tube feed but still refusing. Concerned about blood sugar but refusing feed.

## 2020-03-11 ENCOUNTER — Inpatient Hospital Stay (HOSPITAL_COMMUNITY): Payer: Medicaid Other

## 2020-03-11 ENCOUNTER — Inpatient Hospital Stay (HOSPITAL_COMMUNITY): Payer: Medicaid Other | Admitting: Occupational Therapy

## 2020-03-11 DIAGNOSIS — R350 Frequency of micturition: Secondary | ICD-10-CM

## 2020-03-11 LAB — GLUCOSE, CAPILLARY
Glucose-Capillary: 107 mg/dL — ABNORMAL HIGH (ref 70–99)
Glucose-Capillary: 122 mg/dL — ABNORMAL HIGH (ref 70–99)
Glucose-Capillary: 83 mg/dL (ref 70–99)
Glucose-Capillary: 88 mg/dL (ref 70–99)
Glucose-Capillary: 92 mg/dL (ref 70–99)
Glucose-Capillary: 98 mg/dL (ref 70–99)

## 2020-03-11 NOTE — Progress Notes (Signed)
Occupational Therapy Session Note  Patient Details  Name: Belinda [name] MRN: [id number] Date of Birth: 04-26-1980  Today's Date: 03/11/2020 OT Individual Time: 1418-1530 OT Individual Time Calculation (min): 72 min    Short Term Goals: Week 1:  OT Short Term Goal 1 (Week 1): Pt will complete toileting with supervision OT Short Term Goal 2 (Week 1): Pt will complete UB/LB bathing with supervision OT Short Term Goal 3 (Week 1): Pt will complete toilet transfer with supervision OT Short Term Goal 4 (Week 1): Pt will complete UB/LB dressing with supervision.  Skilled Therapeutic Interventions/Progress Updates:    Pt sitting up in w/c, no c/o pain, agreeable to OT session and excited to have NG tube removed recently.  Pt requesting to bath and dress sinkside.  Pt completed all UB/LB bathing and dressing with overall setup and supervision requiring occasional cues for safety including appropriate locking of brakes prior to standing and implementation of rest breaks as needed for energy conservation.  Pt sitting up in w/c at end of session, call bell in reach, seat belt alarm on.  Pt exhibited increased activity tolerance today with improved sitting and standing balance.  Therapy Documentation Precautions:  Precautions Precautions: Fall Precaution Comments: trach, cortrak, watch HR, monitor BP Restrictions Weight Bearing Restrictions: No   Therapy/Group: Individual Therapy  Ezekiel Slocumb 03/11/2020, 4:03 PM

## 2020-03-11 NOTE — Progress Notes (Signed)
[place] PHYSICAL MEDICINE & REHABILITATION PROGRESS NOTE  Subjective/Complaints: Patient seen sitting up in bed this morning.  She states she did not sleep well overnight due to being in the hospital.  She has questions regarding trach, medication timing, trach care, insulin, Lasix, diet.  Discussed skin irritation with respiratory therapy.  She is seen by neuropsychology yesterday, notes reviewed-working on coping.  ROS: Denies CP, SOB, N/V/D  Objective: Vital Signs: Blood pressure 122/64, pulse 82, temperature 98.4 F (36.9 C), temperature source Oral, resp. rate 18, height 4' 11"  (1.499 m), weight 101 kg, last menstrual period 02/29/2020, SpO2 96 %. No results found. Recent Labs    03/10/20 0453  WBC 5.9  HGB 11.0*  HCT 33.2*  PLT 344   Recent Labs    03/10/20 0453  NA 135  K 3.9  CL 98  CO2 25  GLUCOSE 106*  BUN 8  CREATININE 0.62  CALCIUM 9.0    Intake/Output Summary (Last 24 hours) at 03/11/2020 0919 Last data filed at 03/11/2020 0730 Gross per 24 hour  Intake 615 ml  Output -  Net 615 ml        Physical Exam: BP 122/64 (BP Location: Left Arm)   Pulse 82   Temp 98.4 F (36.9 C) (Oral)   Resp 18   Ht 4' 11"  (1.499 m)   Wt 101 kg   LMP 02/29/2020 (Approximate)   SpO2 96%   BMI 44.97 kg/m   Constitutional: No distress . Vital signs reviewed.  Obese. HENT: Normocephalic.  Atraumatic.  + NG. Neck: + Trach with PMV and trach collar, #6 cuffless Eyes: EOMI. No discharge. Cardiovascular: No JVD.  RRR. Respiratory: Normal effort.  No stridor.  Bilateral clear to auscultation. GI: Non-distended.  BS +. Skin: Warm and dry.  Intact. Psych: Anxious.  Normal behavior. Musc: No edema in extremities.  No tenderness in extremities. Neuro: Alert Motor: 4 -/5 throughout  Assessment/Plan: 1. Functional deficits which require 3+ hours per day of interdisciplinary therapy in a comprehensive inpatient rehab setting.  Physiatrist is providing close team  supervision and 24 hour management of active medical problems listed below.  Physiatrist and rehab team continue to assess barriers to discharge/monitor patient progress toward functional and medical goals   Care Tool:  Bathing    Body parts bathed by patient: Right arm,Left arm,Chest,Abdomen,Front perineal area,Buttocks,Right upper leg,Left upper leg,Right lower leg,Left lower leg,Face         Bathing assist Assist Level: Contact Guard/Touching assist     Upper Body Dressing/Undressing Upper body dressing   What is the patient wearing?: Bra,Pull over shirt    Upper body assist Assist Level: Supervision/Verbal cueing    Lower Body Dressing/Undressing Lower body dressing      What is the patient wearing?: Pants     Lower body assist Assist for lower body dressing: Minimal Assistance - Patient > 75%     Toileting Toileting    Toileting assist Assist for toileting: Moderate Assistance - Patient 50 - 74%     Transfers Chair/bed transfer  Transfers assist     Chair/bed transfer assist level: Minimal Assistance - Patient > 75%     Locomotion Ambulation   Ambulation assist      Assist level: Minimal Assistance - Patient > 75% Assistive device: Walker-rolling Max distance: 35f   Walk 10 feet activity   Assist     Assist level: Minimal Assistance - Patient > 75% Assistive device: Walker-rolling   Walk 50 feet activity  Assist Walk 50 feet with 2 turns activity did not occur: Safety/medical concerns         Walk 150 feet activity   Assist Walk 150 feet activity did not occur: Safety/medical concerns         Walk 10 feet on uneven surface  activity   Assist Walk 10 feet on uneven surfaces activity did not occur: Safety/medical concerns         Wheelchair     Assist Will patient use wheelchair at discharge?: Yes Type of Wheelchair: Manual Wheelchair activity did not occur: Safety/medical concerns  Wheelchair assist level:   (pt would benefit from [facility] assessment however unable to assess during inital eval 2/2 fatigue and anxiety)      Wheelchair 50 feet with 2 turns activity    Assist    Wheelchair 50 feet with 2 turns activity did not occur: Safety/medical concerns       Wheelchair 150 feet activity     Assist  Wheelchair 150 feet activity did not occur: Safety/medical concerns        Medical Problem List and Plan: 1.  Decreased activity tolerance, hypoxia with activity, generalized weakness with flexed posture, narrow base of support with unsteadiness and LE fatigue with ambulation secondary to CIM.  Continue CIR 2.  Antithrombotics: -DVT/anticoagulation:  Pharmaceutical: Lovenox             -antiplatelet therapy: NA 3. Substance abuse/Pain Management:   On methadone, plan to wean             Tramadol added on 1/13  Dilaudid weaned and DC'd on 1/18  Patient would like to be referred to Suboxone clinic outpatient given her history of opioid addiction.   Pain is currently well controlled, continue current wean. 4. Mood: LCSW to follow for evaluation and support.              Appreciate neuropsych evaluation-working on coping  See #8             -antipsychotic agents: N/A--Risperdal d/ced  5. Neuropsych: This patient is not fully capable of making decisions on her own behalf. 6. Skin/Wound Care: Routine pressure relief measures.  7. Fluids/Electrolytes/Nutrition: Monitor I/O.              BMP within acceptable range on 1/13 8. Anxiety disorder: Question of Bipolar d/o-->patient denies and reports mania due to multiple psych medications.   Does not want to try buspar or klonopin, SSRIs/SNRIs-states that nobody has an adverse reaction to them              See #4 9. VDRF secondary to Covid 19 PNA/sepsis: Last course of antibiotics for MSSA VAP completed 01/02. CFS #6. DIscussed vaccination post hospitalization  10. GERD: Protonix bid not as effective and would like to get back on  dexilant 11. Hyperglycemia due to tube feeds on prediabetes: Hgb A1C-5.8.  Relatively controlled on 1/18.   12. Dysphagia: Continue dysphagia # 1, honey liquids.   Eating fairly well.  Will DC NG today and monitor  Started calorie count to monitor intake.   AKI resolved with water flushes--need to encourage thickened fluids intake.   Advance diet as tolerated 13.  Transaminitis  ALT elevated on 1/13, improving  Continue to monitor 14.  Acute blood loss anemia  Hemoglobin 11.0 on 1/17  Continue to monitor 15.  Sleep disturbance: patient requests Tramadol at night to help her sleep. She reports restless leg syndrome. Discussed trialing Requip 0.56m HS to  help her sleep instead and she is agreeable.  Predominantly related to #4 16. Withdrawal symptoms: Previously patient reports jitteriness prior to receiving methadone in the morning, symptoms improve with methadone. She requests increase in Tramadol to help with symptoms. Discussed that this would worsen her symptoms long term. Encouraged continued slow wean while she is in hospital with [place]. Discussed her desired to leave and what her plans are to obtain pain medication post-discharge. Encouraged follow-up at [facility] clinic and she is agreeable. She would like suboxone to be prescribed here- advised that certification is required to prescribe this medication and she expressed understanding.  17.  Urinary urgency: UA unremarkable, decreased Lasix to 10 on 1/18  LOS: 6 days A FACE TO FACE EVALUATION WAS PERFORMED  Ankit [name] 03/11/2020, 9:19 AM

## 2020-03-11 NOTE — Progress Notes (Signed)
Occupational Therapy Session Note  Patient Details  Name: Belinda [name] MRN: [id number] Date of Birth: April 03, 1980  Today's Date: 03/11/2020 OT Missed Time:   30 minutes Missed Time Reason:   Pt fatigue   Short Term Goals: Week 1:  OT Short Term Goal 1 (Week 1): Pt will complete toileting with supervision OT Short Term Goal 2 (Week 1): Pt will complete UB/LB bathing with supervision OT Short Term Goal 3 (Week 1): Pt will complete toilet transfer with supervision OT Short Term Goal 4 (Week 1): Pt will complete UB/LB dressing with supervision.  Skilled Therapeutic Interventions/Progress Updates:    Pt reports she is "wiped out" from previous therapy sessions today and feels she needs to rest despite encouragement to participate from therapist, therefore 30 minutes of missed treatment session.    Therapy Documentation Precautions:  Precautions Precautions: Fall Precaution Comments: trach, cortrak, watch HR, monitor BP Restrictions Weight Bearing Restrictions: No   Audray Rumore L Clearnce Leja 03/11/2020, 4:07 PM

## 2020-03-11 NOTE — Progress Notes (Signed)
Physical Therapy Session Note  Patient Details  Name: Belinda [name] MRN: [id number] Date of Birth: 02/06/1981  Today's Date: 03/11/2020 PT Individual Time: 1100-1154 PT Individual Time Calculation (min): 54 min   Short Term Goals: Week 1:  PT Short Term Goal 1 (Week 1): pt to demonstrate supine<>sit supervision PT Short Term Goal 2 (Week 1): pt to demonstrate functional transfers with LRAD CGA consistently PT Short Term Goal 3 (Week 1): pt to demonstrate ambulation with LRAD 50' CGA  Skilled Therapeutic Interventions/Progress Updates:    Pt received sitting upright in w/c, awake and agreeable to therapy session. She reports L neck pain, unrated. Provided emotional support, redirection, and education on gentle cervical ROM exercises to reduce neck stiffness. Pt remains quite anxious regarding her current mobility status as well as having plenty of questions regarding trach/oxygen status. Requires freuqent encouragement and positive words of affirmation for calming. W/c transport to ortho gym for time management.   Performed car transfer with minA and RW, car height set to simulate her sedan. No difficulty noted with LE management but required assist for safety approach and general technique.   Performed alternating toe taps on 4inch block with CGA and RW support, focusing on driving "knee to chest" to improve hip flexion and foot clearance. Also performed alternating step ups on the 4inch block with CGA and RW, similar cues provided. No LOB during this but steadying assist needed.   Placed x6 cones around ortho gym (in both tight spaces and general space) and instructed patient on retrieval of items to practice item retrieval and functional reaching. She ambulated >33f within the gym with CGA and RW and was able to locate all x6 cones as well as ability to reach appropriately for cones with CGA. She seems to perform better with functional mobility when distractions are provided. She continues  to demonstrate decreased bilateral foot clearance during gait but have observed her demonstrate the ability when she became distracted.   Performed Nustep, x5 minutes, workload of 2, focusing on cardiorespiratory endurance as well as BLE strengthening. She used BLE's only during this and noted BLE tremble's at minute 4. Vitals assessed, reading 97% on 6L via trach collar.   Pt returned to her room in her w/c and remained seated in w/c with chair alarm on and her needs within reach. Reconnected to wall O2 at 6L. Pt pleased with progress made.   Therapy Documentation Precautions:  Precautions Precautions: Fall Precaution Comments: trach, cortrak, watch HR, monitor BP Restrictions Weight Bearing Restrictions: No  Therapy/Group: Individual Therapy  Kartier Bennison P Kathlean Cinco PT 03/11/2020, 7:37 AM

## 2020-03-11 NOTE — Progress Notes (Signed)
Physical Therapy Session Note  Patient Details  Name: Belinda [name] MRN: [id number] Date of Birth: [birth year]  Today's Date: 03/11/2020 PT Individual Time: 0900-1000 PT Individual Time Calculation (min): 60 min   Short Term Goals: Week 1:  PT Short Term Goal 1 (Week 1): pt to demonstrate supine<>sit supervision PT Short Term Goal 2 (Week 1): pt to demonstrate functional transfers with LRAD CGA consistently PT Short Term Goal 3 (Week 1): pt to demonstrate ambulation with LRAD 50' CGA  Skilled Therapeutic Interventions/Progress Updates:   Patient received sitting up in wc, agreeable to PT. She denies pain. Found without trach collar on, but HR 90-100 and O2 >92% throughout session. Patient very apparently anxious and teary-eyed requesting to discuss what she's anxious about to "help (her) calm down." Discussed progress made since hospital admission. Patient reporting anxiety about not having Cortrak pulled- PT reemphasized importance of PO intake. Patient verbalizing understanding. She was able to complete morning ADLs at sink with set up and verbal cues to follow through task due to anxiety. Patient requesting to use bathroom. CGA to transfer to [facility] in bathroom. Supervision for clothing management in standing. NT present for handoff of patient care.    Therapy Documentation Precautions:  Precautions Precautions: Fall Precaution Comments: trach, cortrak, watch HR, monitor BP Restrictions Weight Bearing Restrictions: No    Therapy/Group: Individual Therapy  Karoline Caldwell, PT, DPT, CBIS  03/11/2020, 7:36 AM

## 2020-03-12 ENCOUNTER — Inpatient Hospital Stay (HOSPITAL_COMMUNITY): Payer: Medicaid Other | Admitting: Speech Pathology

## 2020-03-12 ENCOUNTER — Inpatient Hospital Stay (HOSPITAL_COMMUNITY): Payer: Medicaid Other

## 2020-03-12 ENCOUNTER — Inpatient Hospital Stay (HOSPITAL_COMMUNITY): Payer: Medicaid Other | Admitting: Occupational Therapy

## 2020-03-12 DIAGNOSIS — G7281 Critical illness myopathy: Secondary | ICD-10-CM | POA: Diagnosis not present

## 2020-03-12 DIAGNOSIS — R7309 Other abnormal glucose: Secondary | ICD-10-CM

## 2020-03-12 DIAGNOSIS — D62 Acute posthemorrhagic anemia: Secondary | ICD-10-CM | POA: Diagnosis not present

## 2020-03-12 DIAGNOSIS — R7303 Prediabetes: Secondary | ICD-10-CM

## 2020-03-12 DIAGNOSIS — F064 Anxiety disorder due to known physiological condition: Secondary | ICD-10-CM | POA: Diagnosis not present

## 2020-03-12 LAB — GLUCOSE, CAPILLARY
Glucose-Capillary: 101 mg/dL — ABNORMAL HIGH (ref 70–99)
Glucose-Capillary: 115 mg/dL — ABNORMAL HIGH (ref 70–99)
Glucose-Capillary: 155 mg/dL — ABNORMAL HIGH (ref 70–99)
Glucose-Capillary: 75 mg/dL (ref 70–99)
Glucose-Capillary: 85 mg/dL (ref 70–99)
Glucose-Capillary: 97 mg/dL (ref 70–99)

## 2020-03-12 LAB — URINE CULTURE

## 2020-03-12 NOTE — Progress Notes (Signed)
Speech Language Pathology Daily Session Note  Patient Details  Name: Belinda [name] MRN: [id number] Date of Birth: 07-22-1980  Today's Date: 03/12/2020 SLP Individual Time: 4496-7591 SLP Individual Time Calculation (min): 45 min  Short Term Goals: Week 1: SLP Short Term Goal 1 (Week 1): Paient will participate in repeat MBS to determine swallow function improvements and make diet recommendations SLP Short Term Goal 2 (Week 1): Patient will achieve and maintain adequate vocal intensity at conversational level with PMV in place and minA. SLP Short Term Goal 3 (Week 1): Patient will tolerate Dys 2, honey thick liquids diet without overt s/s aspiration or penetration and with minA cues. SLP Short Term Goal 4 (Week 1): Patient will participate in full evaluation of cognition. SLP Short Term Goal 5 (Week 1): Paitent will recall and demonstrate understanding of precautions, strategies, exercises learned in PT/OT/ST with minA.  Skilled Therapeutic Interventions:  Patient seen for skilled ST session to address cognitive-linguistic goals. Patient reported that she has been having sore legs and she asked SLP's advice regarding medications and potential impact with her methodone. SLP advised patient to continue this discussion with MD. Patient has Cortrak tube out and reported that she his "trying" to eat more. Her voice continues to be clear and strong with PMV on, and she is able to phonate without some as well. Patient with intermittent dry hoarse sounding cough during which she holds PMV in place with her fingers. SLP discussed plan for repeat MBS with plan to complete tomorrow morning. Patient asked "Can we do it today?" SLP was able to schedule MBS with patient at 12:30 today. When the time came, patient apparently refused as she told them her bloodsugar was low and she needed to eat first. Patient did not feel overly concerned about having the swallow test, "it doesn't really matter, I can't eat solid  foods because I dont have my top dentures". SLP explained that swallow test was primarily to determine readiness to upgrade from honey thick liquids. Plan to complete MBS next 1-2 dates schedule permitting.  Pain Pain Assessment Pain Scale: 0-10 Faces Pain Scale: Hurts little more Pain Type: Acute pain Pain Location: Leg Pain Orientation: Right;Left Pain Descriptors / Indicators: Aching;Sore Pain Onset: On-going Pain Intervention(s): RN made aware  Therapy/Group: Individual Therapy  Sonia Baller, MA, CCC-SLP Speech Therapy

## 2020-03-12 NOTE — Progress Notes (Signed)
Physical Therapy Session Note  Patient Details  Name: Belinda [name] MRN: [id number] Date of Birth: 01-28-1981  Today's Date: 03/12/2020 PT Individual Time: 0626-9485 PT Individual Time Calculation (min): 38 min   Short Term Goals: Week 1:  PT Short Term Goal 1 (Week 1): pt to demonstrate supine<>sit supervision PT Short Term Goal 2 (Week 1): pt to demonstrate functional transfers with LRAD CGA consistently PT Short Term Goal 3 (Week 1): pt to demonstrate ambulation with LRAD 50' CGA  Skilled Therapeutic Interventions/Progress Updates:    Pt received sitting EOB, agreeable to therapy. No reports of pain. Pt with no coretrack and Pt now with trach capped with no trach collar or nasal canula for oxygen support. Pt saturated 97% on RA with HR 110. Confirmed with RN regarding O2 needs. Pt remains quite anxious regarding mobility and general healing process. Required some education on typical rehabilitation goals and expectations for recovery. Pt reporting need to void. Sit<>stand with supervision to RW and she ambulated to her bathroom with supervision and RW. Stand>sit with supervision to regular height toilet and pt continent of bladder. Able to manage pericare and lower body dressing with distant supervision. W/c transport to main therapy gym.   Gait training 2x129f with close supervision and RW. Cues for increasing R heel strike to improve foot clearance which she was able to initiate. Also provided cues for scapula and shoulder depression and guided pursed lip breathing for calming and oxygen. SpO2 reading 95% with HR 125 after 1264fof gait. Performed furniutre transfers in ADL apartment room with supervision and RW, mild difficulty noted with producing power to rise. She completed session by performing stair training, demonstrating ability to go up/down x8 steps with supervision and 2 hand rail support. No knee buckling or LOB noted. Pt feeling excited regarding her progress.  W/c transport  back to her room and stand pivot transfer with supervision and no AD from w/c to EOB. Sit>supine indep. She remained long sitting in bed at end of session with needs in reach, bed alarm on, RN updated on mobility.  Therapy Documentation Precautions:  Precautions Precautions: Fall Precaution Comments: trach, cortrak, watch HR, monitor BP Restrictions Weight Bearing Restrictions: No  Therapy/Group: Individual Therapy  Leannah Guse P Terik Haughey PT 03/12/2020, 7:38 AM

## 2020-03-12 NOTE — Progress Notes (Signed)
NAME:  Belinda [name], MRN:  [id number], DOB:  Jun 24, 1980, LOS: 7 ADMISSION DATE:  03/05/2020, CONSULTATION DATE:  12/16 REFERRING MD:  Dr Broadus John, CHIEF COMPLAINT:  Tracheostomy status   Brief History:  40 year old female s/p long ICU course due to COVID-19 ARDS. Now she is status post tracheostomy and has been liberated from the mechanical ventilator. PCCM has been consulted for ongoing trach management.   Past Medical History:   has a past medical history of Anxiety, Anxiety, Anxiety disorder, Asthma, Bipolar affective ([place]), Fatty liver (11/14/2018), Fatty liver, GAD (generalized anxiety disorder), GERD (gastroesophageal reflux disease), Morbid obesity ([place]), Narcotic addiction ([place]), Narcotic drug use, OCD (obsessive compulsive disorder), Psoriasis, Reactive airways dysfunction syndrome ([place]), and Vaginal Pap smear, abnormal.  Significant Hospital Events:  12/15: presented to the emergency room, hypoxic, encephalopathic, Covid positive. Started on supplemental oxygen, IV Solu-Medrol, IV remdesivir, and baricitinib.  Also started empirically on cefepime  and vancomycin to cover for potential bacterial pneumonia 12/16: multiple reports of worsening confusion, intermittent combativeness, attempts to bite staff.  Not able to take oral medications.  Started on Precedex. Transferred to [facility]. Changed abx to unasyn, added low dose ativan as pt chronically on xanax.  12/17: Patient more hypoxic, requiring more sedation. Decision made to proceed with intubation.  Intubated, left IJ catheter placed.  PF ratio only 88.  Core track ordered.  Neuromuscular blockade initiated.  Prone protocol initiated. 12/19: No overnight events but this morning hypertensive and tachycardic. EKG personally reviewed, sinus tachycardia with normal axis. BIS values are low in the 30s. She is still on continuous paralytics.  12/20: neuromuscular blockade discontinued yesterday.  At this point in time patient is extremely agitated  on the ventilator despite Dilaudid infusion Versed infusion, oxycodone schedule, clonopoin scheduloed,  and phenobarb once daily at night.  She is on prednisone and Barcitinib. On vent 50%. AFebrikle 12/21: cxr horrible. 40% fio2, pulse ox 90%,.  On dilaudid gtt, oxycdone po, precedex gtt, versed gt and klonopin. On phenobarb QHS x 2 night.  On haldol schedulesd x 1 dayt.   -. RASS -4 but easily goes to +2 per RN. On TF.  K 2.9 while on 99mkcl daily. + 4L volume overloa 12/22 - 40% fio2. On dilaudid gtt, [precedex gtt, versed gtt. On oxy and klonopin. Still very agitated On TF. -2L since admit 12/23 -> stil with significant intermittent agitation. On 30% fio2, Now on diprivan gtt, precedex gtt, fent gtt, versed gtt, levophed gtt. On vent, 30% fio2 12/24  ? Low grade fever. 30% fio2 on vent. On fent gtt, prededex gtt, ketamine gtt, versed gtt . Also on levophed gtt. Some breakthrough agitation per RN but much better overall. Off diprivan gtt 12/26: On fent gtt, precedex gtt, ketamnie gtt, versed gtt. On TF. On free water.  Low grade fever + . On vent  - 30 12/28:Tracheostomy-paralytics off 1/12: Discharged to Inpatient rehab, t#6 shiley cuffed trach replaced by #6 cuffless shiley  Consults:    Procedures:  Intubation 12/17 > 12/28 Left IJ triple-lumen catheter 12/17 > 1/4 Trach (DS) 12/28 >>   Significant Diagnostic Tests:    Micro Data:    Antimicrobials:     Interim History / Subjective:   Had NG removed Nervous about losing tracheostomy  Objective   Blood pressure 118/81, pulse 98, temperature 98.9 F (37.2 C), temperature source Oral, resp. rate 17, height 4' 11"  (1.499 m), weight 101 kg, last menstrual period 02/29/2020, SpO2 97 %.    FiO2 (%):  [  28 %] 28 %   Intake/Output Summary (Last 24 hours) at 03/12/2020 1347 Last data filed at 03/12/2020 0750 Gross per 24 hour  Intake 360 ml  Output 575 ml  Net -215 ml   Filed Weights   03/05/20 1557 03/09/20 0413 03/11/20 0500   Weight: 105.2 kg 106 kg 101 kg    Examination:  General:  Resting comfortably in chair HENT: NCAT OP clear, tracheostomy clean, intact PULM: CTA B, normal effort CV: RRR, no mgr GI: BS+, soft, nontender MSK: normal bulk and tone Neuro: awake, alert, no distress, MAEW   Assessment & Plan:   COVID-19 ARDS no status post tracheostomy  -#6 cuff placed 12/28 downsized to cuffles #6 shiley trach 1/12 -plan to cap the tracheostomy now, then decannulate in 24 hours if she does well  Deconditioning  P: Cap trach now Monitor respiratory status overnight Plan decannulation in AM   Best practice (evaluated daily)  Diet: Per Primary Pain/Anxiety/Delirium protocol (if indicated): NA VAP protocol (if indicated): NA DVT prophylaxis: Per Primary GI prophylaxis: Per Primary Glucose control: Per Primary Mobility: Per Primary Disposition:PCU  Goals of Care:  Code Status: FULL  Roselie Awkward, MD [place] PCCM Pager: [phone] Cell: [phone] If no response, call [phone]

## 2020-03-12 NOTE — Progress Notes (Signed)
Patient ID: Belinda [name], female   DOB: 04/19/1980, 39 y.o.   MRN: [id number]  Met with pt to discuss team conference goals supervision-CGA and target discharge date of 1/25. She is anxious regarding MBS and hopes she does well. She is wanting the trach out and suppose to plug today and see how she does. Team to wean down her O2 since doesn't seem to use when sitting in the room. Pam-PA has reached out to suboxone clinic-[place] to see if would take pt. Discussed having Mom come in for education prior to discharge but home with her son since out of school now. Will try to schedule family education with Mom.  

## 2020-03-12 NOTE — Progress Notes (Signed)
[facility] PHYSICAL MEDICINE & REHABILITATION PROGRESS NOTE  Subjective/Complaints: Patient seen sitting up in her chair this morning.  She states she slept for 2-3 hours last night, which is "amazing" for her.  She requests muscle relaxers.  She has questions regarding insulin and trach.  Prolonged conversation with patient regarding pain medications again.  ROS: Denies CP, SOB, N/V/D  Objective: Vital Signs: Blood pressure 107/65, pulse 86, temperature 98.7 F (37.1 C), temperature source Oral, resp. rate 17, height 4' 11"  (1.499 m), weight 101 kg, last menstrual period 02/29/2020, SpO2 100 %. No results found. Recent Labs    03/10/20 0453  WBC 5.9  HGB 11.0*  HCT 33.2*  PLT 344   Recent Labs    03/10/20 0453  NA 135  K 3.9  CL 98  CO2 25  GLUCOSE 106*  BUN 8  CREATININE 0.62  CALCIUM 9.0    Intake/Output Summary (Last 24 hours) at 03/12/2020 1008 Last data filed at 03/12/2020 0750 Gross per 24 hour  Intake 720 ml  Output 575 ml  Net 145 ml        Physical Exam: BP 107/65 (BP Location: Left Arm)   Pulse 86   Temp 98.7 F (37.1 C) (Oral)   Resp 17   Ht 4' 11"  (1.499 m)   Wt 101 kg   LMP 02/29/2020 (Approximate)   SpO2 100%   BMI 44.97 kg/m   Constitutional: No distress . Vital signs reviewed. Obese.  HENT: Normocephalic.  Atraumatic. Neck: + Trach with PMV and trach collar, #6 cuffless Eyes: EOMI. No discharge. Cardiovascular: No JVD.  RRR. Respiratory: Normal effort.  No stridor.  Bilateral clear to auscultation. GI: Non-distended.  BS +. Skin: Warm and dry.  Intact. Psych: Anxious. Normal behavior. Musc: No edema in extremities.  No tenderness in extremities. Neuro: Alert Motor: 4-/5 throughout  Assessment/Plan: 1. Functional deficits which require 3+ hours per day of interdisciplinary therapy in a comprehensive inpatient rehab setting.  Physiatrist is providing close team supervision and 24 hour management of active medical problems listed  below.  Physiatrist and rehab team continue to assess barriers to discharge/monitor patient progress toward functional and medical goals   Care Tool:  Bathing    Body parts bathed by patient: Right arm,Left arm,Chest,Abdomen,Front perineal area,Buttocks,Right upper leg,Left upper leg,Right lower leg,Left lower leg,Face         Bathing assist Assist Level: Supervision/Verbal cueing     Upper Body Dressing/Undressing Upper body dressing   What is the patient wearing?: Bra,Pull over shirt    Upper body assist Assist Level: Supervision/Verbal cueing    Lower Body Dressing/Undressing Lower body dressing      What is the patient wearing?: Pants     Lower body assist Assist for lower body dressing: Supervision/Verbal cueing     Toileting Toileting    Toileting assist Assist for toileting: Moderate Assistance - Patient 50 - 74%     Transfers Chair/bed transfer  Transfers assist     Chair/bed transfer assist level: Minimal Assistance - Patient > 75%     Locomotion Ambulation   Ambulation assist      Assist level: Minimal Assistance - Patient > 75% Assistive device: Walker-rolling Max distance: 41f   Walk 10 feet activity   Assist     Assist level: Minimal Assistance - Patient > 75% Assistive device: Walker-rolling   Walk 50 feet activity   Assist Walk 50 feet with 2 turns activity did not occur: Safety/medical concerns  Walk 150 feet activity   Assist Walk 150 feet activity did not occur: Safety/medical concerns         Walk 10 feet on uneven surface  activity   Assist Walk 10 feet on uneven surfaces activity did not occur: Safety/medical concerns         Wheelchair     Assist Will patient use wheelchair at discharge?: Yes Type of Wheelchair: Manual Wheelchair activity did not occur: Safety/medical concerns  Wheelchair assist level:  (pt would benefit from [facility] assessment however unable to assess during inital eval  2/2 fatigue and anxiety)      Wheelchair 50 feet with 2 turns activity    Assist    Wheelchair 50 feet with 2 turns activity did not occur: Safety/medical concerns       Wheelchair 150 feet activity     Assist  Wheelchair 150 feet activity did not occur: Safety/medical concerns        Medical Problem List and Plan: 1.  Decreased activity tolerance, hypoxia with activity, generalized weakness with flexed posture, narrow base of support with unsteadiness and LE fatigue with ambulation secondary to CIM.  Continue CIR  Team conference today to discuss current and goals and coordination of care, home and environmental barriers, and discharge planning with nursing, case manager, and therapies. Please see conference note from today as well.  2.  Antithrombotics: -DVT/anticoagulation:  Pharmaceutical: Lovenox             -antiplatelet therapy: NA 3. Substance abuse/Pain Management:   On methadone, plan to wean             Tramadol added on 1/13  Dilaudid weaned and DC'd on 1/18  Patient would like to be referred to Suboxone clinic outpatient given her history of opioid addiction.   Patient requesting muscle relaxers for "unbearable "diffuse muscle pain  Discussed with outpatient Methadone clinic, plans for follow up on Monday 4. Mood: LCSW to follow for evaluation and support.              Appreciate neuropsych evaluation-working on coping  See #8             -antipsychotic agents: N/A--Risperdal d/ced  5. Neuropsych: This patient is not fully capable of making decisions on her own behalf. 6. Skin/Wound Care: Routine pressure relief measures.  7. Fluids/Electrolytes/Nutrition: Monitor I/O.              BMP within acceptable range on 1/13 8. Anxiety disorder: Question of Bipolar d/o-->patient denies and reports mania due to multiple psych medications.   Does not want to try buspar or klonopin, SSRIs/SNRIs-states that nobody has an adverse reaction to them              See  #4 9. VDRF secondary to Covid 19 PNA/sepsis: Last course of antibiotics for MSSA VAP completed 01/02. CFS #6. DIscussed vaccination post hospitalization   Will discuss with Pulm plans for decannulation 10. GERD: Protonix bid not as effective and would like to get back on dexilant 11. Hyperglycemia due to tube feeds on prediabetes: Hgb A1C-5.8.  Labile on 9/19, monitor for trend 12. Dysphagia: Continue dysphagia # 1, honey liquids.   Eating fairly well.  NG DC'd  MBS today-discussed with therapies  Started calorie count to monitor intake.   AKI resolved with water flushes--need to encourage thickened fluids intake.   Advance diet as tolerated 13.  Transaminitis  ALT elevated on 1/13, improving  Continue to monitor 14.  Acute  blood loss anemia  Hemoglobin 11.2 on 1/17  Continue to monitor 15.  Sleep disturbance: patient requests Tramadol at night to help her sleep. She reports restless leg syndrome. Discussed trialing Requip 0.91m HS to help her sleep instead and she is agreeable.  Predominantly related to #4 16. Withdrawal symptoms: Previously patient reports jitteriness prior to receiving methadone in the morning, symptoms improve with methadone. She requests increase in Tramadol to help with symptoms. Discussed that this would worsen her symptoms long term. Encouraged continued slow wean while she is in hospital [place] Discussed her desired to leave and what her plans are to obtain pain medication post-discharge. [facility]. She would like suboxone to be prescribed here- advised that certification is required to prescribe this medication and she expressed understanding.  17.  Urinary urgency: UA unremarkable, decreased Lasix to 10 on 1/18  LOS: 7 days A FACE TO FACE EVALUATION WAS PERFORMED  Chace Bisch ALorie Phenix1/19/2022, 10:08 AM

## 2020-03-12 NOTE — Progress Notes (Signed)
Orthopedic Tech Progress Note Patient Details:  Belinda [name] [birth year] [id number]  Ortho Devices Type of Ortho Device: Prafo boot/shoe Ortho Device/Splint Location: BLE Ortho Device/Splint Interventions: Ordered   Post Interventions Patient Tolerated: Other (comment) Instructions Provided: Other (comment)   [name] 03/12/2020, 12:10 PM

## 2020-03-12 NOTE — Progress Notes (Signed)
Physical Therapy Session Note  Patient Details  Name: Belinda [name] MRN: [id number] Date of Birth: May 11, 1980  Today's Date: 03/12/2020 PT Individual Time: 1030-1120 PT Individual Time Calculation (min): 50 min   Short Term Goals: Week 1:  PT Short Term Goal 1 (Week 1): pt to demonstrate supine<>sit supervision PT Short Term Goal 2 (Week 1): pt to demonstrate functional transfers with LRAD CGA consistently PT Short Term Goal 3 (Week 1): pt to demonstrate ambulation with LRAD 50' CGA  Skilled Therapeutic Interventions/Progress Updates:    Patient received sitting up in wc, agreeable to PT. She denies pain. PA at bedside discussing capping trach, but patient very anxious with multiple questions. PT able to redirect patient to therapy session. Patient reporting that her trach collar is only providing "humidified air" however, it was hooked up to O2 line on 5L. PT unable to find RN to clarify so swapped to portable O2 via trach collar 6L. Patient sats >95% throughout therapy session; HR 80-100BPM. PT propelling patient in wc to therapy gym for time management and energy conservation. She was able to stand and march in place 2x45s. Patient ambulating 2x80f with RW and CGA. Foot drop R LE noted resulting in hip hike to compensate for decreased foot clearance. PT assessing: very tight R heel cord and 1+MMT to anterior tibs. Patient would benefit [facility] at night, prolonged stretching and strengthening to assist with R foot clearance. PT building up foot rests to assist with resting stretch into dorsiflexion. Patient returning to room in wc, chair alarm on, call light within reach.   Therapy Documentation Precautions:  Precautions Precautions: Fall Precaution Comments: trach, cortrak, watch HR, monitor BP Restrictions Weight Bearing Restrictions: No    Therapy/Group: Individual Therapy  JKaroline Caldwell PT, DPT, CBIS  03/12/2020, 7:46 AM

## 2020-03-12 NOTE — Patient Care Conference (Signed)
Inpatient RehabilitationTeam Conference and Plan of Care Update Date: 03/12/2020   Time: 11:23 AM    Patient Name: Belinda [name]      Medical Record Number: [id number]  Date of Birth: May 05, 1980 Sex: Female         Room/Bed: 4M13C/4M13C-01 Payor Info: Payor: Qulin Basile / Plan: [place] / Product Type: *No Product type* /    Admit Date/Time:  03/05/2020  3:34 PM  Primary Diagnosis:  Critical illness myopathy  Hospital Problems: Principal Problem:   Critical illness myopathy Active Problems:   Debility   Chronic pain syndrome   Substance abuse ([place])   Acute blood loss anemia   Transaminitis   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 Delice Lesch Care Coodinator Present: Dorien Chihuahua, RN, BSN, CRRN;Becky Dupree, LCSW Nurse Present: Rayne Du, LPN PT Present: Ginnie Smart, PT OT Present: Leretha Pol, OT SLP Present: Nadara Mode, SLP PPS Coordinator present : Gunnar Fusi, SLP     Current Status/Progress Goal Weekly Team Focus  Bowel/Bladder             Swallow/Nutrition/ Hydration   honey thick liquids and dys 1 textues (due to prefernce and to increase PO intake) MBS today with John (12:30)  Mod I  plann for MBS after NG tube is removed, tolerance of diet and trials of dys 2/3 textures when pt feels comfortable   ADL's   setup-supervision  mod I  self care training, functional transfers, endurance and BUE strengthening, safety awareness   Mobility             Communication   PMSV tolerating, supervision A conversation  Mod I  carryover speech intelliglblity strategies   Safety/Cognition/ Behavioral Observations  Min A  mod I recall, Supervision A attention  recalling OT/PT instructions, complex information and alternating attention   Pain              Skin               Discharge Planning:  HOme with Mom assisting her, going to her home due to one level and no steps. Mom here daily to provide support   Team Discussion: Patient with trach/PMSV tolerated. Anticipate cap trial for possible decannulation before discharge. Progress limited by anxiety and pain issues. MD addressing medications.  Foot drop right lower extremity assessing muscle tightness vs weakness  Patient on target to meet rehab goals: yes, currently set up -supervision with ADLs, supervision for transfers, and CBG for ambulation with RW. *See Care Plan and progress notes for long and short-term goals.   Revisions to Treatment Plan:  Methadone clinic referral placed Soft tissue massage and non-pharmacological modalities for pain control. MBS 03/12/20 Recommend PRAFO for foot drop   Teaching Needs: Transfers, toileting, medications, etc.   Current Barriers to Discharge: Decreased caregiver support, Trach and Pain management  Possible Resolutions to Barriers: Family education    Medical Summary Current Status: Decreased activity tolerance, hypoxia with activity, generalized weakness with flexed posture, narrow base of support with unsteadiness and LE fatigue with ambulation secondary to CIM.  Barriers to Discharge: Behavior;Medical stability;Trach;Other (comments)  Barriers to Discharge Comments: Substance abuse Possible Resolutions to Barriers/Weekly Focus: Therapies, attempt to wean pain meds, appreciate neuropsych eval, advance diet as tolerated, methadone clinic at follow up  Continued Need for Acute Rehabilitation Level of Care: The patient requires daily medical management by a physician with specialized training in physical medicine and rehabilitation for the following reasons: Direction of a multidisciplinary physical rehabilitation program to maximize functional independence : Yes Medical management of patient stability for increased activity  during participation in an intensive rehabilitation regime.: Yes Analysis of laboratory values and/or radiology reports with any subsequent need for medication adjustment and/or medical intervention. : Yes   I attest that I was present, lead the team conference, and concur with the assessment and plan of the team.   Dorien Chihuahua B 03/12/2020, 3:36 PM

## 2020-03-12 NOTE — Progress Notes (Signed)
Occupational Therapy Session Note  Patient Details  Name: Belinda [name] MRN: [id number] Date of Birth: 05/04/1980  Today's Date: 03/12/2020 OT Missed Time: 63 Minutes Missed Time Reason: Other (comment) (Pt eating lunch reporting she needs to eat and wants to defer therapy at this time.)   Short Term Goals: Week 1:  OT Short Term Goal 1 (Week 1): Pt will complete toileting with supervision OT Short Term Goal 2 (Week 1): Pt will complete UB/LB bathing with supervision OT Short Term Goal 3 (Week 1): Pt will complete toilet transfer with supervision OT Short Term Goal 4 (Week 1): Pt will complete UB/LB dressing with supervision.  Skilled Therapeutic Interventions/Progress Updates:    Pt sitting in w/c with lunch tray, upon OT arrival.  Pt reporting she just started lunch and really needs to eat to make sure she has enough energy, asking to defer OT session at this time.    Therapy Documentation Precautions:  Precautions Precautions: Fall Precaution Comments: trach, cortrak, watch HR, monitor BP Restrictions Weight Bearing Restrictions: No   Therapy/Group: Individual Therapy  Ezekiel Slocumb 03/12/2020, 1:48 PM

## 2020-03-12 NOTE — Progress Notes (Signed)
Patient's trach capped by RT. Patient extremely anxious, asking for xanax "right now". Nurse informed that was not possible due to timing of medication. Nurse reassured patient that her vitals were WNL and oxy sat was 100%. Patient taught relaxation techniques and is still on continuous pulse oxy for monitoring. Patient "feels better and reassured" at the moment. Instructed patient to call if needing additional support.

## 2020-03-13 ENCOUNTER — Inpatient Hospital Stay (HOSPITAL_COMMUNITY): Payer: Medicaid Other

## 2020-03-13 ENCOUNTER — Inpatient Hospital Stay (HOSPITAL_COMMUNITY): Payer: Medicaid Other | Admitting: Speech Pathology

## 2020-03-13 ENCOUNTER — Inpatient Hospital Stay (HOSPITAL_COMMUNITY): Payer: Medicaid Other | Admitting: Occupational Therapy

## 2020-03-13 ENCOUNTER — Inpatient Hospital Stay (HOSPITAL_COMMUNITY): Payer: Medicaid Other | Admitting: *Deleted

## 2020-03-13 LAB — GLUCOSE, CAPILLARY
Glucose-Capillary: 105 mg/dL — ABNORMAL HIGH (ref 70–99)
Glucose-Capillary: 109 mg/dL — ABNORMAL HIGH (ref 70–99)
Glucose-Capillary: 84 mg/dL (ref 70–99)
Glucose-Capillary: 94 mg/dL (ref 70–99)
Glucose-Capillary: 96 mg/dL (ref 70–99)
Glucose-Capillary: 98 mg/dL (ref 70–99)

## 2020-03-13 MED ORDER — MENTHOL 3 MG MT LOZG
1.0000 | LOZENGE | OROMUCOSAL | Status: DC | PRN
  Administered 2020-03-13: 3 mg via ORAL
  Filled 2020-03-13 (×4): qty 9

## 2020-03-13 NOTE — Progress Notes (Signed)
Occupational Therapy Session Note  Patient Details  Name: Belinda [name] MRN: [id number] Date of Birth: 05-09-1980  Today's Date: 03/13/2020 OT Individual Time: 0165-5374 OT Individual Time Calculation (min): 69 min    Short Term Goals: Week 1:  OT Short Term Goal 1 (Week 1): Pt will complete toileting with supervision OT Short Term Goal 2 (Week 1): Pt will complete UB/LB bathing with supervision OT Short Term Goal 3 (Week 1): Pt will complete toilet transfer with supervision OT Short Term Goal 4 (Week 1): Pt will complete UB/LB dressing with supervision.  Skilled Therapeutic Interventions/Progress Updates:    Pt sitting up EOB, no c/o pain, reports she would like to defer bathing/dressing today.  Pt ambulated using RW with supervision to ortho gym.  Pt completed x 10 minutes using Nustep on level 3.  Pt ambulated to EOM using RW with supervision.  Completed bilateral shoulder scaption using 2lb free weight 2 x 10 reps each.  Pt ambulated back to room using RW with supervision and requesting to use bathroom. Toilet transfer and toileting completed with supervision.  Pt had continent episode of urine.  Pt ambulated to sink and washed hands with supervision. SPO2 on RA 95-99% and pulse 86 bpm throughout session.  Provided pt with medium resistive sponge and level 1 therapy band and instructed pt through HEP of bilateral gross grasp and shoulder horizontal abd/add.  Pt with good return demonstration and verbalized understanding.  Pt returned to w/c with supervision, call bell in reach, seat alarm on.  Pt tolerating moderate intensity level activity while maintaining vitals WNL.    Therapy Documentation Precautions:  Precautions Precautions: Fall Precaution Comments: trach, cortrak, watch HR, monitor BP Restrictions Weight Bearing Restrictions: No   Therapy/Group: Individual Therapy  Ezekiel Slocumb 03/13/2020, 8:23 AM

## 2020-03-13 NOTE — Progress Notes (Signed)
[facility] PHYSICAL MEDICINE & REHABILITATION PROGRESS NOTE  Subjective/Complaints: Patient seen sitting up this morning.  She states she did not sleep well overnight because she was anxious about her trach being capped.  She has questions regarding medication changes, trach removal, supplemental oxygen.  She was seen by pulm yesterday, notes reviewed, - capped.   ROS: Denies CP, SOB, N/V/D  Objective: Vital Signs: Blood pressure 103/71, pulse 84, temperature 98.2 F (36.8 C), temperature source Oral, resp. rate 18, height 4' 11"  (1.499 m), weight 101 kg, last menstrual period 02/29/2020, SpO2 97 %. No results found. No results for input(s): WBC, HGB, HCT, PLT in the last 72 hours. No results for input(s): NA, K, CL, CO2, GLUCOSE, BUN, CREATININE, CALCIUM in the last 72 hours.  Intake/Output Summary (Last 24 hours) at 03/13/2020 0914 Last data filed at 03/13/2020 0756 Gross per 24 hour  Intake 360 ml  Output 1300 ml  Net -940 ml        Physical Exam: BP 103/71 (BP Location: Left Arm)   Pulse 84   Temp 98.2 F (36.8 C) (Oral)   Resp 18   Ht 4' 11"  (1.499 m)   Wt 101 kg   LMP 02/29/2020 (Approximate)   SpO2 97%   BMI 44.97 kg/m   Constitutional: No distress . Vital signs reviewed.  Obese. HENT: Normocephalic.  Atraumatic. Neck: + Trach #6, capped Eyes: EOMI. No discharge. Cardiovascular: No JVD.  RRR. Respiratory: Normal effort.  No stridor.  Bilateral clear to auscultation. GI: Non-distended.  BS +. Skin: Warm and dry.  Intact. Psych: Anxious. Normal behavior. Musc: No edema in extremities.  No tenderness in extremities. Neuro: Alert Motor: 4-/5 throughout, stable  Assessment/Plan: 1. Functional deficits which require 3+ hours per day of interdisciplinary therapy in a comprehensive inpatient rehab setting.  Physiatrist is providing close team supervision and 24 hour management of active medical problems listed below.  Physiatrist and rehab team continue to assess  barriers to discharge/monitor patient progress toward functional and medical goals   Care Tool:  Bathing    Body parts bathed by patient: Right arm,Left arm,Chest,Abdomen,Front perineal area,Buttocks,Right upper leg,Left upper leg,Right lower leg,Left lower leg,Face         Bathing assist Assist Level: Supervision/Verbal cueing     Upper Body Dressing/Undressing Upper body dressing   What is the patient wearing?: Bra,Pull over shirt    Upper body assist Assist Level: Supervision/Verbal cueing    Lower Body Dressing/Undressing Lower body dressing      What is the patient wearing?: Pants     Lower body assist Assist for lower body dressing: Supervision/Verbal cueing     Toileting Toileting    Toileting assist Assist for toileting: Moderate Assistance - Patient 50 - 74%     Transfers Chair/bed transfer  Transfers assist     Chair/bed transfer assist level: Supervision/Verbal cueing     Locomotion Ambulation   Ambulation assist      Assist level: Supervision/Verbal cueing Assistive device: Walker-rolling Max distance: 138f   Walk 10 feet activity   Assist     Assist level: Supervision/Verbal cueing Assistive device: Walker-rolling   Walk 50 feet activity   Assist Walk 50 feet with 2 turns activity did not occur: Safety/medical concerns  Assist level: Supervision/Verbal cueing Assistive device: Walker-rolling    Walk 150 feet activity   Assist Walk 150 feet activity did not occur: Safety/medical concerns         Walk 10 feet on uneven surface  activity   Assist Walk 10 feet on uneven surfaces activity did not occur: Safety/medical concerns         Wheelchair     Assist Will patient use wheelchair at discharge?: No Type of Wheelchair: Manual Wheelchair activity did not occur: Safety/medical concerns  Wheelchair assist level:  (pt would benefit from [facility] assessment however unable to assess during inital eval 2/2 fatigue  and anxiety)      Wheelchair 50 feet with 2 turns activity    Assist    Wheelchair 50 feet with 2 turns activity did not occur: Safety/medical concerns       Wheelchair 150 feet activity     Assist  Wheelchair 150 feet activity did not occur: Safety/medical concerns        Medical Problem List and Plan: 1.  Decreased activity tolerance, hypoxia with activity, generalized weakness with flexed posture, narrow base of support with unsteadiness and LE fatigue with ambulation secondary to CIM.  Continue CIR 2.  Antithrombotics: -DVT/anticoagulation:  Pharmaceutical: Lovenox             -antiplatelet therapy: NA 3. Substance abuse/Pain Management:   On methadone, plan to wean             Tramadol added on 1/13  Dilaudid weaned and DC'd on 1/18  Patient would like to be referred to Suboxone clinic outpatient given her history of opioid addiction.   Discussed with outpatient Methadone clinic, plans for follow up on Monday  Controlled on 1/20 4. Mood: LCSW to follow for evaluation and support.              Appreciate neuropsych evaluation-working on coping  See #8             -antipsychotic agents: N/A--Risperdal d/ced  5. Neuropsych: This patient is not fully capable of making decisions on her own behalf. 6. Skin/Wound Care: Routine pressure relief measures.  7. Fluids/Electrolytes/Nutrition: Monitor I/O.              BMP within acceptable range on 1/17 8. Anxiety disorder: Question of Bipolar d/o-->patient denies and reports mania due to multiple psych medications.   Does not want to try buspar or klonopin, SSRIs/SNRIs-states that nobody has an adverse reaction to them              See #4 9. VDRF secondary to Covid 19 PNA/sepsis: Last course of antibiotics for MSSA VAP completed 01/02. CFS #6-capped.   DIscussed vaccination post hospitalization   Plan for decannulation if tolerates. 10. GERD: Protonix bid not as effective and would like to get back on dexilant 11.  Hyperglycemia due to tube feeds on prediabetes: Hgb A1C-5.8.  Relatively controlled on 1/20 12. Dysphagia: Continue dysphagia # 1, honey liquids.   Eating fairly well.  NG DC'd  Refused MBS on 1/18  Started calorie count to monitor intake.   AKI resolved with water flushes--need to encourage thickened fluids intake.   Advance diet as tolerated 13.  Transaminitis  ALT elevated on 1/13, improving  Continue to monitor 14.  Acute blood loss anemia  Hemoglobin 11.2 on 1/17  Continue to monitor 15.  Sleep disturbance: patient requests Tramadol at night to help her sleep. She reports restless leg syndrome. Discussed trialing Requip 0.85m HS to help her sleep instead and she is agreeable.  Predominantly related to #4 16. Withdrawal symptoms: Previously patient reports jitteriness prior to receiving methadone in the morning, symptoms improve with methadone. She requests increase in Tramadol to help  with symptoms. Discussed that this would worsen her symptoms long term. Encouraged continued slow wean while she is in hospital with [place]. Discussed her desired to leave and what her plans are to obtain pain medication post-discharge. Encouraged follow-up at [facility] clinic and she is agreeable. She would like suboxone to be prescribed here- advised that certification is required to prescribe this medication and she expressed understanding.  17.  Urinary urgency: UA unremarkable, decreased Lasix to 10 on 1/18  LOS: 8 days A FACE TO FACE EVALUATION WAS PERFORMED  Ankit [name] 03/13/2020, 9:14 AM

## 2020-03-13 NOTE — Progress Notes (Signed)
Nutrition Follow-up  RD working remotely.  DOCUMENTATION CODES:   Morbid obesity  INTERVENTION:   - Continue Magic Cup TID with meals, each supplement provides 290 kcal and 9 grams of protein  - Continue MVI with minerals daily  - Encourage adequate PO intake  NUTRITION DIAGNOSIS:   Inadequate oral intake related to decreased appetite,dysphagia as evidenced by meal completion < 25%.  Progressing  GOAL:   Patient will meet greater than or equal to 90% of their needs  Progressing  MONITOR:   PO intake,Supplement acceptance,Diet advancement,Labs,Weight trends,I & O's  REASON FOR ASSESSMENT:   Consult Assessment of nutrition requirement/status,Calorie Count,Enteral/tube feeding initiation and management  ASSESSMENT:   40 year old female with PMH of anxiety disorder, GERD. Pt was admitted on 01/27/20 with COVID-19 PNA. Pt with worsening of respiratory status due to ARDS requiring intubation. Pt underwent tracheostomy on 02/19/20 and was weaned to trach collar. Pt is tolerating a dysphagia 2 diet with honey-thick liquids but continues on nocturnal tube feeds via Cortrak due to poor PO intake. Admitted to CIR on 03/06/20.  1/18 - Cortrak removed  Pt's trach capped yesterday afternoon. Noted plan for decannulation if pt tolerates.  Per notes, pt refused MBS yesterday due to wanting to eat lunch. Plan to repeat MBS soon as scheduling allows. RD will monitor for diet advancement and adjust supplement regimen as appropriate.  Pt's weight is down a total of 4.2 kg since admission to CIR. This is a 4% weight loss in less than 2 weeks which is severe and significant for timeframe.  Meal Completion: 0-100% x last 8 documented meals (averaging ~72%)  Medications reviewed and include: lasix, SSI q 4 hours, levemir 5 units BID, magic mouthwash, melatonin, methadone, MVI with minerals, protonix, thiamine  Labs reviewed. CBG's: 84-115 x 24 hours  UOP: 875 ml x 24 hours  Diet  Order:   Diet Order            DIET - DYS 1 Room service appropriate? Yes; Fluid consistency: Honey Thick  Diet effective now                 EDUCATION NEEDS:   Not appropriate for education at this time  Skin:  Skin Assessment: Reviewed RN Assessment  Last BM:  03/11/20  Height:   Ht Readings from Last 1 Encounters:  03/05/20 4' 11"  (1.499 m)    Weight:   Wt Readings from Last 1 Encounters:  03/13/20 101 kg    BMI:  Body mass index is 44.97 kg/m.  Estimated Nutritional Needs:   Kcal:  2200-2400  Protein:  110-130 grams  Fluid:  >/= 2.0 L    Gustavus Bryant, MS, RD, LDN Inpatient Clinical Dietitian Please see [facility] for contact information.

## 2020-03-13 NOTE — Progress Notes (Signed)
Physical Therapy Weekly Progress Note  Patient Details  Name: Belinda [name] MRN: [id number] Date of Birth: 03/31/1980  Beginning of progress report period: March 06, 2020 End of progress report period: March 13, 2020  Today's Date: 03/13/2020 PT Individual Time: 1012-1059 + 1415-1440 PT Individual Time Calculation (min): 47 min  + 25 min  Patient has met 3 of 3 short term goals. Pt is making great progress towards her goals. She has demonstrated ability to perform bed mobility mod I, transfers with CGA and no AD, gait up to 182f with supervision and RW, car transfers with CGA, and has even been able to go up/down x12 steps with supervision and 2 hand rails. She is also able to ambulate ~100-1531fwith CGA and no AD. Her trach is now capped and she no longer has coretrack feeding tube, thus improving her indep with functional mobility. She continues to be highly anxious and fearful of movement and has plenty of questions regarding her care and progress with therapies.   Patient continues to demonstrate the following deficits muscle weakness, decreased cardiorespiratoy endurance, decreased problem solving and decreased safety awareness and decreased balance strategies and therefore will continue to benefit from skilled PT intervention to increase functional independence with mobility.  Patient progressing toward long term goals.  Continue plan of care.  PT Short Term Goals Week 2:  PT Short Term Goal 1 (Week 2): STG = LTG due to ELOS  Skilled Therapeutic Interventions/Progress Updates:   1st session:   Pt received sitting EOB, agreeable to therapy. No reports of pain. Trach capped throughout session. O2 resting 99% on RA and fluctuating b/w 95-97% after stairs and gait. Sit<>stand with supervision and no AD where she ambulated sinkside to perform hand hygiene, completed this with supervision. W/c transport to main therapy gym.  Gait training 15015fith CGA and no AD on level surfaces.  Cues for increasing R heel strike and step-through vs step-to gait pattern. Also provided ed regarding guided breathing and scapular depression. Gait training [id number] side-stepping L<>R with CGA and no AD, cues for increasing step height/foot clearance rather than "dragging" her foot. Pt reporting moderate fatigue after completion, vitals stable.  Stair training x12 steps with supervision and 2 hand rails, demo's step-to pattern with light reliance on hand rails, no knee buckling or LOB. Cues for paced activity with emphasis on safety awareness.  Dynamic standing balance with trampoline rebounder and light green ball, performed in both forward facing and side facing to promote core facilitation. Good reaction times with UE noted and CGA needed for stability. No LOB noted.   W/c transport back to her room where she remained seated in w/c with needs in reach, pt pleased and thankful for progress made. Pt remains highly anxious with several questions regarding her trach, medical care, and other general questions. Benefits from gentle redirection.  2nd session: Pt received laying in bed, sleeping on arrival but awakens easily to voice. Pt reports she had just recently had her trach removed by RT and endorses throat soreness and general fatigue from our prior PT session. She pleasantly defer's any OOB mobility training because of this. Pt requesting to discuss her traumatic experience with her hospitalization (intubation, feeding tubes, trach, oxygen needs, generalized weakness, etc). Pt reporting anxiety regarding these matters but feels as though she's handling the situation relatively well. Therapeutic listening, calming, and redirection provided to encourage her and provide motivation to returning functional gains. Pt appreciative of our conversation. She remained in bed  at end of session, needs within reach, made comfortable. She missed 50 minutes of skilled therapy.  Therapy  Documentation Precautions:  Precautions Precautions: Fall Precaution Comments: trach, cortrak, watch HR, monitor BP Restrictions Weight Bearing Restrictions: No General: PT Amount of Missed Time (min): 50 Minutes PT Missed Treatment Reason: Patient fatigue;Other (Comment) (throat soreness after trach removal)  Therapy/Group: Individual Therapy  Jaqwon Manfred P Maxton Noreen PT 03/13/2020, 7:52 AM

## 2020-03-13 NOTE — Progress Notes (Signed)
Patient ID: Belinda [name], female   DOB: Jan 01, 1981, 40 y.o.   MRN: [id number]  Met with pt to discuss discharge needs, she is very proud of herself for being brave when trach was taken out. She is doing well and has no needs for O2. Discussed her Mom coming in Monday as long as the weather cooperates for education prior to discharging home on Tuesday. Discussed equipment needs-rw, 3 in 1 and tub seat. Pt is doing well. She asked PA for throat lozenge due to throat is dry. Will let RN know this. Pam-PA working on [employer] clinic admission for pt. Continue to work on discharge needs for next Tuesday.

## 2020-03-13 NOTE — Progress Notes (Signed)
Speech Language Pathology Weekly Progress and Session Note  Patient Details  Name: Belinda [name] MRN: [id number] Date of Birth: 28-Feb-1980  Beginning of progress report period: 03/06/2020 End of progress report period: 03/13/2020 Today's Date: 03/13/2020 SLP Individual Time: 1100-1145 SLP Individual Time Calculation (min): 45 min  Short Term Goals: Week 1: SLP Short Term Goal 1 (Week 1): Paient will participate in repeat MBS to determine swallow function improvements and make diet recommendations SLP Short Term Goal 1 - Progress (Week 1): Progressing toward goal SLP Short Term Goal 2 (Week 1): Patient will achieve and maintain adequate vocal intensity at conversational level with PMV in place and minA. SLP Short Term Goal 2 - Progress (Week 1): Met SLP Short Term Goal 3 (Week 1): Patient will tolerate Dys 2, honey thick liquids diet without overt s/s aspiration or penetration and with minA cues. SLP Short Term Goal 3 - Progress (Week 1): Met SLP Short Term Goal 4 (Week 1): Patient will participate in full evaluation of cognition. SLP Short Term Goal 4 - Progress (Week 1): Discontinued (comment) (discontinued as patient presenting with cognitive impairments secondary to premorbid anxiety and anticipate she is at or near baseline) SLP Short Term Goal 5 (Week 1): Paitent will recall and demonstrate understanding of precautions, strategies, exercises learned in PT/OT/ST with minA. SLP Short Term Goal 5 - Progress (Week 1): Met    New Short Term Goals: Week 2: SLP Short Term Goal 1 (Week 2): STG's=LTGs  Weekly Progress Updates:  Patient met goals related to speech/voice and diet toleration but MBS did not occur yet secondary to patient refusal at time of scheduling. Will occur on 1/21 unless patient refuses again. SLP is discontinuing cognitive goals as it appears that patient's cognitive function is at or near baseline and is significantly impacted by her anxiety. Focus of SLP interventions  will be dysphagia.    Intensity: Minumum of 1-2 x/day, 30 to 90 minutes Frequency: 3 to 5 out of 7 days Duration/Length of Stay: 1/25 Treatment/Interventions: Dysphagia/aspiration precaution training;Speech/Language facilitation;Environmental controls;Patient/family education;Functional tasks   Daily Session  Skilled Therapeutic Interventions: Patient seen to address dysphagia goals. Patient has trach capped and voice sounds strong and clear. She was mixing up her own thickened drinks but SLP showed her proper consistencies as she has drinks in between a nectar and honey thick. SLP was not able to get MBS rescheduled for today and so will take place tomorrow morning and patient is aware. SLP upgraded patient's liquids to nectar thick after review of MBS and observation with intake of nectar thick liquids at bedside. Patient anxious about decannulation which is occurring some time today. SLP answered patient's questions and tried to reassure her. Main concerns she had was size of stoma, would she be able to eat and talk after, and concerns that it "is glued in there like the last one". SLP showed patient a brief video found online of someone being decannulated. (Apparently during PT session later in day, patient told PT she was bothered by the video but she had not voiced these concerns to SLP at the time). Patient continues with significant amount of anxiety. Respiratory therapist came into room and decannulated patient, after which she was able to achieve voicing immediately. Patient requesting a "butterfly bandage" to close up stoma and SLP and RT explaining that the stoma will close up without this. Patient to have MBS tomorrow (1/21) and SLP plans to discharge from services if her swallow function is [facility] and without aspiration  or penetration.   General    Pain Pain Assessment Pain Scale: 0-10 Pain Score: 0-No pain  Therapy/Group: Individual Therapy  Sonia Baller, MA, CCC-SLP Speech  Therapy

## 2020-03-13 NOTE — Evaluation (Signed)
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 Anxiety    compulsive worring, phobia  . Anxiety   . Anxiety disorder   . Asthma   . Bipolar affective ([place])   . Fatty liver 11/14/2018  . Fatty liver   . GAD (generalized anxiety disorder)    with panic attacks  . GERD (gastroesophageal reflux disease)   . Morbid obesity ([place])   . Narcotic addiction ([place])   . Narcotic drug use   . OCD (obsessive compulsive disorder)   . Psoriasis   . Reactive airways dysfunction syndrome ([place])   . Vaginal Pap smear, abnormal    Past Surgical History:       Past Surgical History:  Procedure Laterality Date  . CESAREAN SECTION     twice  . COLPOSCOPY    . DILATION AND CURETTAGE OF UTERUS    . ESOPHAGOGASTRODUODENOSCOPY  07/2018  . ESOPHAGOGASTRODUODENOSCOPY (EGD) WITH PROPOFOL N/A 08/03/2018   Procedure: ESOPHAGOGASTRODUODENOSCOPY (EGD) WITH PROPOFOL;  Surgeon: Daneil Dolin, MD;  Location: [facility] SU[facility];  Service: Endoscopy;  Laterality: N/A;  11:00am  . PID      Assessment & Plan Clinical Impression: Patient is a 40 y.o. year old female withhistory of anxiety d/o, morbid obesity, GERD who was admitted on 01/27/20 via [facility] with hypoxia, confusion and delirium wht hypotension due to Covid 19 PNA. History taken from chart review and patient due to cognition. Patient was sedated with precedex due to agitation, intubated and treated with Covid protocol-Remdesiver, barcitinib, Solumedrol and antibiotics.  Hospital course significant for issues with agitation and combativeness (required fentanyl, Precedex, ketamine, versed) as well as worsening of respiratory status due to ARDS requiring intubation. CT head unremarkable for acute intracranial process on 12/15 and again on 12/27. UDS positive for benzo's and opiates. She was started on methadone on 02/16/2020 due to concerns of drug withdrawal and on 12/26 family (confirmed with patient) reported that patient had been taking her fianc's Suboxone for the past year and ( hx of accidental ibuprofen/ETOH OD in 2007--question "another substance" per mother)  Hospital course further complicated by acute lower UTI. Klebsiella UTI with sepsis treated with Unasyn. Neurology consulted for input on refractory delirium and encephalopathy, which was felt to be multifactorial. EEG done negative for seizures and showed severe diffuse encephalopathy. She developed ARDS with CAP v/s aspiration and underwent tracheostomy on 02/19/2020 by Dr. Tamala Julian. She was being weaned to [facility] and on 02/25/2020 found to have disconjugate gaze with question of Left>right sided weakness. 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 QTc being monitored while on methadone with as needed Haldol.  Mentation improving and scheduled hydromorphone and methadone being weaned off. Risperdal DC'd due to concerns of tremors/jerks. Xanax resumed per home regimen but she was reporting diarrhea with attempts to wean dilaudid and  increased to 1 mg tid and methadone increased to 10 mg bid on 03/04/2020 fluid overload treated with intermittent IV diuresis and now on oral lasix. Tolerating dysphagia 2 with honey liquids but continues on nocturnal tube feeds due to poor  intake. PCCM following for input and recommended downsize to cuffless #6 but patient wanted to wait until today due to anxiety. Therapy has been ongoing and patient limited by decreased activity tolerance, hypoxia with activity, weakness with flexed posture, narrow base of support with unsteadiness and LE fatigue with ambulation. CIR recommended due to functional deficits in mobility and ADLs. Please see preadmission assessment from earlier today as well. Patient transferred to CIR on 03/05/2020 .  Pt decannulated today.   Pt presents with decreased activity tolerance, decreased functional mobility, decreased balance Limiting pt's independence with leisure/community pursuits.  Met with pt to discuss leisure interests, leisure education, activity analysis/modifications and coping strategies.  Pt stated feeling better now that trach had been removed, felt less anxious.  Discussed at length pts symptoms of stress/anxiety, activities that elicits anxiety, previous coping strategies and introduced new strategies, including education on apps that she could utilize on her phone to assist with relaxation.  Pt stated understanding and appreciation for information.    Plan  No further TR as pt is discharging 1/25  Recommendations for other services: Neuropsych  Discharge Criteria: Patient will be discharged from TR if patient refuses treatment 3 consecutive times without medical reason.  If treatment goals not met, if there is a change in medical status, if patient makes no progress towards goals or if patient is discharged from hospital.  The above assessment, treatment plan, treatment alternatives and goals were discussed and mutually agreed upon: by patient  [place] 03/13/2020, 3:59 PM

## 2020-03-13 NOTE — Progress Notes (Signed)
NAME:  Belinda [name], MRN:  [id number], DOB:  31-Aug-1980, LOS: 8 ADMISSION DATE:  03/05/2020, CONSULTATION DATE:  12/16 REFERRING MD:  Dr Broadus John, CHIEF COMPLAINT:  Tracheostomy status   Brief History:  40 year old female s/p long ICU course due to COVID-19 ARDS. Now she is status post tracheostomy and has been liberated from the mechanical ventilator. PCCM has been consulted for ongoing trach management.   Past Medical History:   has a past medical history of Anxiety, Anxiety, Anxiety disorder, Asthma, Bipolar affective ([place]), Fatty liver (11/14/2018), Fatty liver, GAD (generalized anxiety disorder), GERD (gastroesophageal reflux disease), Morbid obesity ([place]), Narcotic addiction ([place]), Narcotic drug use, OCD (obsessive compulsive disorder), Psoriasis, Reactive airways dysfunction syndrome ([place]), and Vaginal Pap smear, abnormal.  Significant Hospital Events:  12/15: presented to the emergency room, hypoxic, encephalopathic, Covid positive. Started on supplemental oxygen, IV Solu-Medrol, IV remdesivir, and baricitinib.  Also started empirically on cefepime  and vancomycin to cover for potential bacterial pneumonia 12/16: multiple reports of worsening confusion, intermittent combativeness, attempts to bite staff.  Not able to take oral medications.  Started on Precedex. Transferred to [facility]. Changed abx to unasyn, added low dose ativan as pt chronically on xanax.  12/17: Patient more hypoxic, requiring more sedation. Decision made to proceed with intubation.  Intubated, left IJ catheter placed.  PF ratio only 88.  Core track ordered.  Neuromuscular blockade initiated.  Prone protocol initiated. 12/19: No overnight events but this morning hypertensive and tachycardic. EKG personally reviewed, sinus tachycardia with normal axis. BIS values are low in the 30s. She is still on continuous paralytics.  12/20: neuromuscular blockade discontinued yesterday.  At this point in time patient is extremely agitated  on the ventilator despite Dilaudid infusion Versed infusion, oxycodone schedule, clonopoin scheduloed,  and phenobarb once daily at night.  She is on prednisone and Barcitinib. On vent 50%. AFebrikle 12/21: cxr horrible. 40% fio2, pulse ox 90%,.  On dilaudid gtt, oxycdone po, precedex gtt, versed gt and klonopin. On phenobarb QHS x 2 night.  On haldol schedulesd x 1 dayt.   -. RASS -4 but easily goes to +2 per RN. On TF.  K 2.9 while on 44mkcl daily. + 4L volume overloa 12/22 - 40% fio2. On dilaudid gtt, [precedex gtt, versed gtt. On oxy and klonopin. Still very agitated On TF. -2L since admit 12/23 -> stil with significant intermittent agitation. On 30% fio2, Now on diprivan gtt, precedex gtt, fent gtt, versed gtt, levophed gtt. On vent, 30% fio2 12/24  ? Low grade fever. 30% fio2 on vent. On fent gtt, prededex gtt, ketamine gtt, versed gtt . Also on levophed gtt. Some breakthrough agitation per RN but much better overall. Off diprivan gtt 12/26: On fent gtt, precedex gtt, ketamnie gtt, versed gtt. On TF. On free water.  Low grade fever + . On vent  - 30 12/28:Tracheostomy-paralytics off 1/12: Discharged to Inpatient rehab, t#6 shiley cuffed trach replaced by #6 cuffless shiley  Consults:    Procedures:  Intubation 12/17 > 12/28 Left IJ triple-lumen catheter 12/17 > 1/4 Trach (DS) 12/28 >>   Significant Diagnostic Tests:    Micro Data:    Antimicrobials:     Interim History / Subjective:   Ready to have trach removed  Objective   Blood pressure 103/71, pulse 98, temperature 98.2 F (36.8 C), temperature source Oral, resp. rate 18, height 4' 11"  (1.499 m), weight 101 kg, last menstrual period 02/29/2020, SpO2 96 %.  Intake/Output Summary (Last 24 hours) at 03/13/2020 1023 Last data filed at 03/13/2020 0756 Gross per 24 hour  Intake 360 ml  Output 1300 ml  Net -940 ml   Filed Weights   03/09/20 0413 03/11/20 0500 03/13/20 0500  Weight: 106 kg 101 kg 101 kg     Examination: Awake alert no acute distress at rest HEENT trach is in place and For greater than 24 hours Chest clear to auscultation Heart sounds regular    Assessment & Plan:   COVID-19 ARDS no status post tracheostomy  -#6 cuff placed 12/28 downsized to cuffles #6 shiley trach 1/12 -plan to cap the tracheostomy now, then decannulate in 24 hours if she does well  Deconditioning  P: de cannulate now   Best practice (evaluated daily)  Diet: Per Primary Pain/Anxiety/Delirium protocol (if indicated): NA VAP protocol (if indicated): NA DVT prophylaxis: Per Primary GI prophylaxis: Per Primary Glucose control: Per Primary Mobility: Per Primary Disposition:PCU  Goals of Care:  Code Status: FULL  Richardson Landry [name] ACNP Acute Care Nurse Practitioner [place] Please consult Amion 03/13/2020, 10:24 AM

## 2020-03-13 NOTE — Procedures (Signed)
Tracheostomy Change Note  Patient Details:   Name: Belinda [name] DOB: [birth year] MRN: [id number]    Airway Documentation:     Evaluation  O2 sats: stable throughout Complications: No apparent complications Patient did tolerate procedure well. Bilateral Breath Sounds: Clear  SPO2 99% on RA Decannulated patient per order.  Patient tolerated well.  [name] [facility] 03/13/2020, 11:55 AM

## 2020-03-14 ENCOUNTER — Inpatient Hospital Stay (HOSPITAL_COMMUNITY): Payer: Medicaid Other

## 2020-03-14 ENCOUNTER — Encounter (HOSPITAL_COMMUNITY): Payer: Medicaid Other | Admitting: Speech Pathology

## 2020-03-14 ENCOUNTER — Inpatient Hospital Stay (HOSPITAL_COMMUNITY): Payer: Medicaid Other | Admitting: Speech Pathology

## 2020-03-14 ENCOUNTER — Inpatient Hospital Stay (HOSPITAL_COMMUNITY): Payer: Medicaid Other | Admitting: Occupational Therapy

## 2020-03-14 LAB — GLUCOSE, CAPILLARY
Glucose-Capillary: 103 mg/dL — ABNORMAL HIGH (ref 70–99)
Glucose-Capillary: 120 mg/dL — ABNORMAL HIGH (ref 70–99)
Glucose-Capillary: 70 mg/dL (ref 70–99)
Glucose-Capillary: 99 mg/dL (ref 70–99)
Glucose-Capillary: 99 mg/dL (ref 70–99)

## 2020-03-14 NOTE — Progress Notes (Signed)
Physical Therapy Session Note  Patient Details  Name: Belinda [name] MRN: [id number] Date of Birth: [birth year]  Today's Date: 03/14/2020 PT Individual Time: 1415-1445 PT Individual Time Calculation (min): 30 min   Short Term Goals: Week 2:  PT Short Term Goal 1 (Week 2): STG = LTG due to ELOS  Skilled Therapeutic Interventions/Progress Updates:    Pt received sleeping in bed on arrival, awakens to voice, agreeable to therapy. Reports no pain but continues to endorse sore throat from trach removal yesterday. Supine<>sit mod I with bed features and stand<>pivot to w/c with supervision and no AD. W/c transport to day room gym for energy conservation.   Stand<>pivot mod I to Nustep and she completed x57mnutes at workload of 3 using both BUE's and BLE's, focusing on cardiorespiratory endurance and general strengthening. Vitals stable throughout with oxygen at 99% and HR att 98.   Performed car transfer with supervision and ambulated up/down x155framp with CGA and no AD. She ambulated ~15015fith supervision and no AD on level surfaces. Cues for increased R foot clearance and improving R heel strike which she was able to correct ~50% of the time. Returned to her room where she performed stand>sit with supervision. She remained seated EOB at end of session with needs in reach.   Therapy Documentation Precautions:  Precautions Precautions: Fall Precaution Comments: trach, cortrak, watch HR, monitor BP Restrictions Weight Bearing Restrictions: No  Therapy/Group: Individual Therapy  Tobias Avitabile P Taelyn Nemes PT 03/14/2020, 7:46 AM

## 2020-03-14 NOTE — Progress Notes (Signed)
Occupational Therapy Weekly Progress Note  Patient Details  Name: Belinda [name] MRN: [id number] Date of Birth: 05/02/1980  Beginning of progress report period: March 06, 2020 End of progress report period: March 14, 2020  Today's Date: 03/14/2020 OT Individual Time: 9791-5041 OT Individual Time Calculation (min): 45 min    Patient has met 4 of 4 short term goals. Pt has made steady progress with skilled OT evidenced by increased independence throughout all BADLs and is now at mod I- supervision level compared to CGA on initial evaluation due to increased activity tolerance, balance, and safety awareness.  Primary barrier in therapy is pts anxiety level which does require consistent redirecting by therapist to skilled training/task at hand. Pt would benefit from skilled OT for further training on HEP, shower level bathing, and continue to increase activity tolerance and improve standing balance, to ensure safe transition to home setting.  Patient continues to demonstrate the following deficits: muscle weakness, decreased cardiorespiratoy endurance and decreased standing balance and therefore will continue to benefit from skilled OT intervention to enhance overall performance with BADL.  Patient progressing toward long term goals..  Continue plan of care.  OT Short Term Goals Week 1:  OT Short Term Goal 1 (Week 1): Pt will complete toileting with supervision OT Short Term Goal 1 - Progress (Week 1): Met OT Short Term Goal 2 (Week 1): Pt will complete UB/LB bathing with supervision OT Short Term Goal 2 - Progress (Week 1): Met OT Short Term Goal 3 (Week 1): Pt will complete toilet transfer with supervision OT Short Term Goal 3 - Progress (Week 1): Met OT Short Term Goal 4 (Week 1): Pt will complete UB/LB dressing with supervision. OT Short Term Goal 4 - Progress (Week 1): Met Week 2:  OT Short Term Goal 1 (Week 2): STGs=LTGs due to ELOS  Skilled Therapeutic Interventions/Progress  Updates:    Pt sitting in w/c, no c/o pain, agreeable to OT session, however pt requiring redirection throughout session due to some distractibility.  Pt ambulated without AD to therapy bathroom approximately 30 feet with close supervision.  Pt completed step over tub transfer without use of grab bars (pt does not have in her bathroom at home)and sit<>stand at shower chair with close supervision x 2 trials.  Educated pt on safety strategies in shower including use of anti-slip mat, drying off completely before transferring out of tub, completing UB/LB bathing seated using lateral leans, and donning shoes with when transferring out of tub.  Pt ambulated back to room and returned to seated at w/c with supervision.  Call bell in reach, seat alarm on.   Therapy Documentation Precautions:  Precautions Precautions: Fall Precaution Comments: trach, cortrak, watch HR, monitor BP Restrictions Weight Bearing Restrictions: No   Therapy/Group: Individual Therapy  Ezekiel Slocumb 03/14/2020, 3:59 PM

## 2020-03-14 NOTE — Progress Notes (Signed)
Modified Barium Swallow Progress Note  Patient Details  Name: Belinda [name] MRN: [id number] Date of Birth: 1980/08/01  Today's Date: 03/14/2020  Modified Barium Swallow completed.  Full report located under Chart Review in the Imaging Section.  Brief recommendations include the following:  Clinical Impression  Patient presents with an oropharyngeal swallow that is largely WFL-WNL and without aspiration. She had one instance of very trace flash penetration with straw sip of thin liquids, with full clearance from laryngeal vestibule. No pharyngeal residuals observed s/p initial swallows. Patient did present with suspected dysmotility of boluses at level of upper thoracic esophagus, but this did appear to clear with sips of regular thin water. (no radiologist present to confirm). Based on today's MBS, patient is safe for all solid and liquid textures and does not require any swallow precautions or f/u from SLP services. Of note, patient chose to not trial any solids beyond purees because of her lack of dentition. Patient is safe to resume solids without SLP intervention.   Swallow Evaluation Recommendations       SLP Diet Recommendations: Thin liquid;Dysphagia 3 (Mech soft) solids   Liquid Administration via: Straw;Cup   Medication Administration: Whole meds with puree   Supervision: Patient able to self feed   Compensations: Slow rate;Small sips/bites   Postural Changes: Seated upright at 90 degrees   Oral Care Recommendations: Oral care BID;Patient independent with oral care       Sonia Baller, MA, CCC-SLP Speech Therapy

## 2020-03-14 NOTE — Progress Notes (Signed)
Speech Language Pathology Discharge Summary  Patient Details  Name: Belinda [name] MRN: [id number] Date of Birth: April 06, 1980  Today's Date: 03/14/2020 SLP Individual Time: 0930-1015 SLP Individual Time Calculation (min): 45 min   Skilled Therapeutic Interventions:  Patient seen for MBS followed by education regarding swallow progress, safety and answering patient's questions and concerns to her satisfaction.     Patient has met 7 of 7 long term goals.  Patient to discharge at overall Modified Independent level.  Reasons goals not met: N/A   Clinical Impression/Discharge Summary: Patient was able to meet all 7 of her LTG's during this CIR stay. Majority of SLP intervention was spent focusing on swallow function and education of patient during progression of PMV to decannulation. Patient has been successfully decannulated, has been eating by mouth (Coret[place] previously discharged) and has been weaning off of oxygen. She had repeat MBS on 1/21, which showed largely WFL swallow and so thickened liquids were discharged. Patient requested to remain on puree solids as she does not have her upper dentures. Patient appears [facility] and at baseline for cognitive function. Her anxiety seemed to play a significant role in her ability to demonstrate recall and to consistently follow safety precautions. SLP is not recommending further intervention at this venue or upon discharge home.  Care Partner:  Caregiver Able to Provide Assistance: Yes  Type of Caregiver Assistance: Physical  Recommendation:  None      Equipment: N/A   Reasons for discharge: Treatment goals met   Patient/Family Agrees with Progress Made and Goals Achieved: Yes    Sonia Baller, MA, CCC-SLP Speech Therapy

## 2020-03-14 NOTE — Progress Notes (Signed)
Physical Therapy Session Note  Patient Details  Name: Belinda [name] MRN: [id number] Date of Birth: 1981-02-05  Today's Date: 03/14/2020 PT Individual Time: 0818-0900 PT Individual Time Calculation (min): 42 min   Short Term Goals: Week 2:  PT Short Term Goal 1 (Week 2): STG = LTG due to ELOS  Skilled Therapeutic Interventions/Progress Updates:   Pt up in w/c moving about room indpenendently at w/c level and reports she had changed her clothes already this morning due to being dirtied from a lozenge and butter. (Pt somewhat perseverative on this during session). Pt gathering clothing from [place] and dressers at w/c level for energy conservation with pt asking frequent questions about progress and discharge. Provided education and emotional support throughout as well as energy conservation techniques (pt continuing to report feeling fatigued in her arms and legs from activity.) Total assist to transport pt to laundry room for time management. Focused on upright standing tolerance and dynamic standing balance to do laundry including loading clothes, setting washing machine, and performing functional transfers with close supervision to CGA. End of session returned to room and handoff to RN.  Therapy Documentation Precautions:  Precautions Precautions: Fall Precaution Comments: decannulated, watch HR, monitor BP Restrictions Weight Bearing Restrictions: No    Vital Signs:  O2 = 96% room air HR = 110 bpm Pain:  No complaints of pain, just fatigue.     Therapy/Group: Individual Therapy  [name], PT, DPT, CBIS  03/14/2020, 11:58 AM

## 2020-03-14 NOTE — Progress Notes (Signed)
[place] PHYSICAL MEDICINE & REHABILITATION PROGRESS NOTE  Subjective/Complaints: Patient seen sitting up in her chair this morning, working with therapies.  She states she slept fairly overnight due to some soreness after trach removal.  She has questions regarding medications, insulin, prognosis, vaccination, etc.  ROS: Denies CP, SOB, N/V/D  Objective: Vital Signs: Blood pressure 125/88, pulse 92, temperature 97.8 F (36.6 C), temperature source Oral, resp. rate 18, height 4' 11"  (1.499 m), weight 100.9 kg, last menstrual period 02/29/2020, SpO2 100 %. No results found. No results for input(s): WBC, HGB, HCT, PLT in the last 72 hours. No results for input(s): NA, K, CL, CO2, GLUCOSE, BUN, CREATININE, CALCIUM in the last 72 hours.  Intake/Output Summary (Last 24 hours) at 03/14/2020 0945 Last data filed at 03/13/2020 2100 Gross per 24 hour  Intake 360 ml  Output 300 ml  Net 60 ml        Physical Exam: BP 125/88 (BP Location: Left Arm)   Pulse 92   Temp 97.8 F (36.6 C) (Oral)   Resp 18   Ht 4' 11"  (1.499 m)   Wt 100.9 kg   LMP 02/29/2020 (Approximate)   SpO2 100%   BMI 44.93 kg/m   Constitutional: No distress . Vital signs reviewed.  Obese. HENT: Normocephalic.  Atraumatic. Neck: + Stoma Eyes: EOMI. No discharge. Cardiovascular: No JVD.  RRR. Respiratory: Normal effort.  No stridor.  Bilateral clear to auscultation. GI: Non-distended.  BS +. Skin: Warm and dry.  Intact. Psych: Anxious. Normal behavior. Musc: No edema in extremities.  No tenderness in extremities. Neuro: Alert Motor: 4-/5 throughout, unchanged  Assessment/Plan: 1. Functional deficits which require 3+ hours per day of interdisciplinary therapy in a comprehensive inpatient rehab setting.  Physiatrist is providing close team supervision and 24 hour management of active medical problems listed below.  Physiatrist and rehab team continue to assess barriers to discharge/monitor patient progress  toward functional and medical goals   Care Tool:  Bathing    Body parts bathed by patient: Right arm,Left arm,Chest,Abdomen,Front perineal area,Buttocks,Right upper leg,Left upper leg,Right lower leg,Left lower leg,Face         Bathing assist Assist Level: Supervision/Verbal cueing     Upper Body Dressing/Undressing Upper body dressing   What is the patient wearing?: Bra,Pull over shirt    Upper body assist Assist Level: Supervision/Verbal cueing    Lower Body Dressing/Undressing Lower body dressing      What is the patient wearing?: Pants     Lower body assist Assist for lower body dressing: Supervision/Verbal cueing     Toileting Toileting    Toileting assist Assist for toileting: Supervision/Verbal cueing     Transfers Chair/bed transfer  Transfers assist     Chair/bed transfer assist level: Supervision/Verbal cueing     Locomotion Ambulation   Ambulation assist      Assist level: Supervision/Verbal cueing Assistive device: Walker-rolling Max distance: 123f   Walk 10 feet activity   Assist     Assist level: Supervision/Verbal cueing Assistive device: Walker-rolling   Walk 50 feet activity   Assist Walk 50 feet with 2 turns activity did not occur: Safety/medical concerns  Assist level: Supervision/Verbal cueing Assistive device: Walker-rolling    Walk 150 feet activity   Assist Walk 150 feet activity did not occur: Safety/medical concerns         Walk 10 feet on uneven surface  activity   Assist Walk 10 feet on uneven surfaces activity did not occur: Safety/medical concerns  Wheelchair     Assist Will patient use wheelchair at discharge?: No Type of Wheelchair: Manual Wheelchair activity did not occur: Safety/medical concerns  Wheelchair assist level:  (pt would benefit from [facility] assessment however unable to assess during inital eval 2/2 fatigue and anxiety)      Wheelchair 50 feet with 2 turns  activity    Assist    Wheelchair 50 feet with 2 turns activity did not occur: Safety/medical concerns       Wheelchair 150 feet activity     Assist  Wheelchair 150 feet activity did not occur: Safety/medical concerns        Medical Problem List and Plan: 1.  Decreased activity tolerance, hypoxia with activity, generalized weakness with flexed posture, narrow base of support with unsteadiness and LE fatigue with ambulation secondary to CIM.  Continue CIR 2.  Antithrombotics: -DVT/anticoagulation:  Pharmaceutical: Lovenox             -antiplatelet therapy: NA 3. Substance abuse/Pain Management:   On methadone, plan to wean             Tramadol added on 1/13  Dilaudid weaned and DC'd on 1/18  Patient would like to be referred to Suboxone clinic outpatient given her history of opioid addiction.   Discussed with outpatient Methadone clinic, plans for follow up on Monday  Controlled on 1/21 4. Mood: LCSW to follow for evaluation and support.              Appreciate neuropsych evaluation-working on coping  See #8             -antipsychotic agents: N/A--Risperdal d/ced  5. Neuropsych: This patient is not fully capable of making decisions on her own behalf. 6. Skin/Wound Care: Routine pressure relief measures.  7. Fluids/Electrolytes/Nutrition: Monitor I/O.              BMP within acceptable range on 1/17, labs ordered for Monday 8. Anxiety disorder: Question of Bipolar d/o-->patient denies and reports mania due to multiple psych medications.   Does not want to try buspar or klonopin, SSRIs/SNRIs-states that nobody has an adverse reaction to them              See #4 9. VDRF secondary to Covid 19 PNA/sepsis: Last course of antibiotics for MSSA VAP completed 01/02.   Decannulated on 1/20   DIscussed vaccination post hospitalization  10. GERD: Protonix bid not as effective and would like to get back on dexilant 11. Hyperglycemia due to tube feeds on prediabetes: Hgb  A1C-5.8.  Levamir DC'd on 1/21  Will consider oral agent as necessary 12. Dysphagia:   Dysphagia # 1, nectar liquids.   Eating fairly well.  NG DC'd  Refused MBS on 1/18, plan again for today  Started calorie count to monitor intake.   AKI resolved with water flushes--need to encourage thickened fluids intake.   Advance diet as tolerated 13.  Transaminitis  ALT elevated on 1/13, improving, labs ordered for Monday  Continue to monitor 14.  Acute blood loss anemia  Hemoglobin 11.0 on 1/17, labs with Monday  Continue to monitor 15.  Sleep disturbance: patient requests Tramadol at night to help her sleep. She reports restless leg syndrome. Discussed trialing Requip 0.52m HS to help her sleep instead and she is agreeable.  Predominantly related to #4 16. Withdrawal symptoms: Previously patient reports jitteriness prior to receiving methadone in the morning, symptoms improve with methadone. She requests increase in Tramadol to help with symptoms. Discussed that  this would worsen her symptoms long term. Encouraged continued slow wean while she is in hospital with [place]. Discussed her desired to leave and what her plans are to obtain pain medication post-discharge. Encouraged follow-up at [facility] clinic and she is agreeable. She would like suboxone to be prescribed here- advised that certification is required to prescribe this medication and she expressed understanding.  17.  Urinary urgency: UA unremarkable, decreased Lasix to 10 on 1/18  LOS: 9 days A FACE TO FACE EVALUATION WAS PERFORMED  Belinda [name] [name] 03/14/2020, 9:45 AM

## 2020-03-15 LAB — GLUCOSE, CAPILLARY
Glucose-Capillary: 103 mg/dL — ABNORMAL HIGH (ref 70–99)
Glucose-Capillary: 107 mg/dL — ABNORMAL HIGH (ref 70–99)
Glucose-Capillary: 121 mg/dL — ABNORMAL HIGH (ref 70–99)
Glucose-Capillary: 127 mg/dL — ABNORMAL HIGH (ref 70–99)
Glucose-Capillary: 92 mg/dL (ref 70–99)
Glucose-Capillary: 97 mg/dL (ref 70–99)

## 2020-03-15 MED ORDER — SALINE SPRAY 0.65 % NA SOLN
1.0000 | NASAL | Status: DC | PRN
  Administered 2020-03-15: 1 via NASAL
  Filled 2020-03-15: qty 44

## 2020-03-15 MED ORDER — PRAZOSIN HCL 2 MG PO CAPS: 4.0000 mg | ORAL_CAPSULE | Freq: Every day | ORAL | Status: DC

## 2020-03-15 MED ORDER — FLUTICASONE PROPIONATE 50 MCG/ACT NA SUSP
1.0000 | Freq: Every day | NASAL | Status: DC
  Administered 2020-03-16: 1 via NASAL
  Filled 2020-03-15: qty 16

## 2020-03-15 MED ORDER — TRAZODONE HCL 50 MG PO TABS
100.0000 mg | ORAL_TABLET | Freq: Every day | ORAL | Status: DC
  Administered 2020-03-17: 100 mg via ORAL
  Filled 2020-03-15: qty 2

## 2020-03-15 MED ORDER — QUETIAPINE FUMARATE 50 MG PO TABS
50.0000 mg | ORAL_TABLET | Freq: Every evening | ORAL | Status: DC | PRN
Start: 2020-03-15 — End: 2020-03-16
  Administered 2020-03-15: 50 mg via ORAL
  Filled 2020-03-15: qty 1

## 2020-03-15 NOTE — Progress Notes (Signed)
Dr. Dagoberto Ligas stopped in to see patient. No complications noted at this time. Trazadone increased 166m at bedtime. Seroquel added for sleep if needed.  AAudie Clear LPN

## 2020-03-15 NOTE — Progress Notes (Signed)
Reported to room, patient tearful stating that her mind was playing tricks on her and that she felt she was going crazy. On-call doctor lovorn notified of situation. Asked patient if she wanted to harm herself or do anything to resulting in damaging items regarding herself and she responded "No, just don't know what her mind is capable of because she does not feel like herself." Patient also reported "she hasn't been able to sleep at night." Informed patient of not taking sleeping medication at night. She responded with "I refusing medication because she still wasn't sleeping." Encouraged patient that she needs to keep taking sleeping medication so that it can get into her system so that it can help. Informed patient of being able to ask for pysch consult if she feels like she needs it. Informed patient of being able to sit at nurses station with staff members when possible if needed until further instructions and she declines. Dr. Dagoberto Ligas stated she will drop by to see patient when able. No further complications noted at this time.  Audie Clear, LPN

## 2020-03-15 NOTE — Progress Notes (Signed)
[place] PHYSICAL MEDICINE & REHABILITATION PROGRESS NOTE  Subjective/Complaints:  Pt reports her throat is dry and cannot sleep at all (although told Dr Posey Pronto was sleeping well).   Also said "hurts all over" and needs more meds- explained I will not change regimen- and she has trazodone for sleep prn- said "scared of getting addicted to it"- explained it's not addictive.    ROS: Pt denies SOB, abd pain, CP, N/V/C/D, and vision changes   Objective: Vital Signs: Blood pressure 115/78, pulse 87, temperature 97.6 F (36.4 C), resp. rate 17, height 4' 11"  (1.499 m), weight 98.4 kg, last menstrual period 02/29/2020, SpO2 98 %. DG Swallowing Func-Speech Pathology  Result Date: 03/14/2020 Objective Swallowing Evaluation: Type of Study: MBS-Modified Barium Swallow Study  Patient Details Name: Belinda [name] MRN: [id number] Date of Birth: Dec 05, 1980 Today's Date: 03/14/2020 Time: SLP Start Time (ACUTE ONLY): 1040 -SLP Stop Time (ACUTE ONLY): 1308 SLP Time Calculation (min) (ACUTE ONLY): 13 min Past Medical History: Past Medical History: Diagnosis Date . Anxiety   compulsive worring, phobia . Anxiety  . Anxiety disorder  . Asthma  . Bipolar affective ([place])  . Fatty liver 11/14/2018 . Fatty liver  . GAD (generalized anxiety disorder)   with panic attacks . GERD (gastroesophageal reflux disease)  . Morbid obesity ([place])  . Narcotic addiction ([place])  . Narcotic drug use  . OCD (obsessive compulsive disorder)  . Psoriasis  . Reactive airways dysfunction syndrome ([place])  . Vaginal Pap smear, abnormal  Past Surgical History: Past Surgical History: Procedure Laterality Date . CESAREAN SECTION    twice . COLPOSCOPY   . DILATION AND CURETTAGE OF UTERUS   . ESOPHAGOGASTRODUODENOSCOPY  07/2018 . ESOPHAGOGASTRODUODENOSCOPY (EGD) WITH PROPOFOL N/A 08/03/2018  Procedure: ESOPHAGOGASTRODUODENOSCOPY (EGD) WITH PROPOFOL;  Surgeon: Daneil Dolin, MD;  Location: [facility];  Service: Endoscopy;  Laterality: N/A;  11:00am .  PID   HPI: Pt is a 40 year old female with medical history significant for obesity.  She presented to [facility] ED on 12/15 with 24 to 48 hours of AMS following exposure to her COVID+ son. CXR showed diffuse bilateral airspace disease, she was confused and agitated. Physical restraints applied on admission and Precedex infusion given.  She required 100% nonrebreather mask and was transferred to [facility] for further intervention. CXR 12/29: Improved bilateral ventilation with residual confluent bibasilar opacity. CT head negative. ETT 12/17-trach 12/28. Stat CT head completed on 1/3 due to increased weakness noted by PT and was negative.  No data recorded Assessment / Plan / Recommendation CHL IP CLINICAL IMPRESSIONS 03/14/2020 Clinical Impression Patient presents with an oropharyngeal swallow that is largely WFL-WNL and without aspiration. She had one instance of very trace flash penetration with straw sip of thin liquids, with full clearance from laryngeal vestibule. No pharyngeal residuals observed s/p initial swallows. Patient did present with suspected dysmotility of boluses at level of upper thoracic esophagus, but this did appear to clear with sips of regular thin water. (no radiologist present to confirm). Based on today's MBS, patient is safe for all solid and liquid textures and does not require any swallow precautions or f/u from SLP services. Of note, patient chose to not trial any solids beyond purees because of her lack of dentition. Patient is safe to resume solids without SLP intervention. SLP Visit Diagnosis Dysphagia, pharyngeal phase (R13.13) Attention and concentration deficit following -- Frontal lobe and executive function deficit following -- Impact on safety and function No limitations   CHL IP TREATMENT  RECOMMENDATION 03/14/2020 Treatment Recommendations No treatment recommended at this time   Prognosis 02/27/2020 Prognosis for Safe Diet Advancement Good Barriers to Reach Goals --  Barriers/Prognosis Comment -- CHL IP DIET RECOMMENDATION 03/14/2020 SLP Diet Recommendations Thin liquid;Dysphagia 3 (Mech soft) solids Liquid Administration via Straw;Cup Medication Administration Whole meds with puree Compensations Slow rate;Small sips/bites Postural Changes Seated upright at 90 degrees   CHL IP OTHER RECOMMENDATIONS 03/14/2020 Recommended Consults -- Oral Care Recommendations Oral care BID;Patient independent with oral care Other Recommendations --   CHL IP FOLLOW UP RECOMMENDATIONS 03/14/2020 Follow up Recommendations None   CHL IP FREQUENCY AND DURATION 02/27/2020 Speech Therapy Frequency (ACUTE ONLY) min 2x/week Treatment Duration 2 weeks      CHL IP ORAL PHASE 03/14/2020 Oral Phase WFL Oral - Pudding Teaspoon -- Oral - Pudding Cup -- Oral - Honey Teaspoon -- Oral - Honey Cup -- Oral - Nectar Teaspoon -- Oral - Nectar Cup -- Oral - Nectar Straw -- Oral - Thin Teaspoon -- Oral - Thin Cup -- Oral - Thin Straw -- Oral - Puree -- Oral - Mech Soft -- Oral - Regular -- Oral - Multi-Consistency -- Oral - Pill -- Oral Phase - Comment --  CHL IP PHARYNGEAL PHASE 03/14/2020 Pharyngeal Phase Impaired Pharyngeal- Pudding Teaspoon -- Pharyngeal -- Pharyngeal- Pudding Cup -- Pharyngeal -- Pharyngeal- Honey Teaspoon -- Pharyngeal -- Pharyngeal- Honey Cup NT Pharyngeal -- Pharyngeal- Nectar Teaspoon -- Pharyngeal -- Pharyngeal- Nectar Cup NT Pharyngeal -- Pharyngeal- Nectar Straw NT Pharyngeal -- Pharyngeal- Thin Teaspoon NT Pharyngeal -- Pharyngeal- Thin Cup [facility] Pharyngeal Material does not enter airway Pharyngeal- Thin Straw Penetration/Aspiration during swallow Pharyngeal Material does not enter airway;Material enters airway, remains ABOVE vocal cords then ejected out Pharyngeal- Puree WFL Pharyngeal Material does not enter airway Pharyngeal- Mechanical Soft NT Pharyngeal -- Pharyngeal- Regular -- Pharyngeal -- Pharyngeal- Multi-consistency -- Pharyngeal -- Pharyngeal- Pill WFL Pharyngeal Material does not enter  airway Pharyngeal Comment --  CHL IP CERVICAL ESOPHAGEAL PHASE 03/14/2020 Cervical Esophageal Phase WFL Pudding Teaspoon -- Pudding Cup -- Honey Teaspoon -- Honey Cup -- Nectar Teaspoon -- Nectar Cup -- Nectar Straw -- Thin Teaspoon -- Thin Cup -- Thin Straw -- Puree -- Mechanical Soft -- Regular -- Multi-consistency -- Pill -- Cervical Esophageal Comment -- Sonia Baller, MA, CCC-SLP Speech Therapy             No results for input(s): WBC, HGB, HCT, PLT in the last 72 hours. No results for input(s): NA, K, CL, CO2, GLUCOSE, BUN, CREATININE, CALCIUM in the last 72 hours.  Intake/Output Summary (Last 24 hours) at 03/15/2020 1318 Last data filed at 03/15/2020 0730 Gross per 24 hour  Intake 0 ml  Output 400 ml  Net -400 ml        Physical Exam: BP 115/78 (BP Location: Left Arm)   Pulse 87   Temp 97.6 F (36.4 C)   Resp 17   Ht 4' 11"  (1.499 m)   Wt 98.4 kg   LMP 02/29/2020 (Approximate)   SpO2 98%   BMI 43.82 kg/m   Constitutional: No distress . Vital signs reviewed.  Obese. Sitting up in w/c, NAD HENT: Normocephalic.  Atraumatic. Neck: + Stoma covered with dressing- C/D/I- mouth appears slightly dry and sniffing a lot from nasal congestion Eyes: EOMI. No discharge. Cardiovascular: RRR Respiratory: CTA B/L- no W/R/R- good air movement GI: Soft, NT, ND, (+)BS  Skin: Warm and dry.  Intact. Psych: anxious and revved up some as soon as I walked  in- calmed down after discussion Musc: No edema in extremities.  No tenderness in extremities. Neuro: Alert Motor: 4-/5 throughout, unchanged  Assessment/Plan: 1. Functional deficits which require 3+ hours per day of interdisciplinary therapy in a comprehensive inpatient rehab setting.  Physiatrist is providing close team supervision and 24 hour management of active medical problems listed below.  Physiatrist and rehab team continue to assess barriers to discharge/monitor patient progress toward functional and medical goals   Care  Tool:  Bathing    Body parts bathed by patient: Right arm,Left arm,Chest,Abdomen,Front perineal area,Buttocks,Right upper leg,Left upper leg,Right lower leg,Left lower leg,Face         Bathing assist Assist Level: Supervision/Verbal cueing     Upper Body Dressing/Undressing Upper body dressing   What is the patient wearing?: Bra,Pull over shirt    Upper body assist Assist Level: Supervision/Verbal cueing    Lower Body Dressing/Undressing Lower body dressing      What is the patient wearing?: Pants     Lower body assist Assist for lower body dressing: Supervision/Verbal cueing     Toileting Toileting    Toileting assist Assist for toileting: Supervision/Verbal cueing     Transfers Chair/bed transfer  Transfers assist     Chair/bed transfer assist level: Supervision/Verbal cueing     Locomotion Ambulation   Ambulation assist      Assist level: Supervision/Verbal cueing Assistive device: Walker-rolling Max distance: 149f   Walk 10 feet activity   Assist     Assist level: Supervision/Verbal cueing Assistive device: Walker-rolling   Walk 50 feet activity   Assist Walk 50 feet with 2 turns activity did not occur: Safety/medical concerns  Assist level: Supervision/Verbal cueing Assistive device: Walker-rolling    Walk 150 feet activity   Assist Walk 150 feet activity did not occur: Safety/medical concerns         Walk 10 feet on uneven surface  activity   Assist Walk 10 feet on uneven surfaces activity did not occur: Safety/medical concerns         Wheelchair     Assist Will patient use wheelchair at discharge?: No Type of Wheelchair: Manual Wheelchair activity did not occur: Safety/medical concerns  Wheelchair assist level:  ([facility] unable to assess during inital eval 2/2 fatigue and anxiety)      Wheelchair 50 feet with 2 turns activity    Assist    Wheelchair 50 feet with  2 turns activity did not occur: Safety/medical concerns       Wheelchair 150 feet activity     Assist  Wheelchair 150 feet activity did not occur: Safety/medical concerns        Medical Problem List and Plan: 1.  Decreased activity tolerance, hypoxia with activity, generalized weakness with flexed posture, narrow base of support with unsteadiness and LE fatigue with ambulation secondary to CIM.  Continue CIR 2.  Antithrombotics: -DVT/anticoagulation:  Pharmaceutical: Lovenox             -antiplatelet therapy: NA 3. Substance abuse/Pain Management:   On methadone, plan to wean             Tramadol added on 1/13  Dilaudid weaned and DC'd on 1/18  Patient would like to be referred to Suboxone clinic outpatient given her history of opioid addiction.   Discussed with outpatient Methadone clinic, plans for follow up on Monday  Controlled on 1/21 1/22- says pain "NOT controlled"- and took it 30 minutes prior- explained can take up  to 1 hr to kick in- will not change meds per d/w Dr Posey Pronto- explained that to pt.  4. Mood: LCSW to follow for evaluation and support.              Appreciate neuropsych evaluation-working on coping  See #8             -antipsychotic agents: N/A--Risperdal d/ced  5. Neuropsych: This patient is not fully capable of making decisions on her own behalf. 6. Skin/Wound Care: Routine pressure relief measures.  7. Fluids/Electrolytes/Nutrition: Monitor I/O.              BMP within acceptable range on 1/17, labs ordered for Monday 8. Anxiety disorder: Question of Bipolar d/o-->patient denies and reports mania due to multiple psych medications.   Does not want to try buspar or klonopin, SSRIs/SNRIs-states that everybody has an adverse reaction to them              See #4 9. VDRF secondary to Covid 19 PNA/sepsis: Last course of antibiotics for MSSA VAP completed 01/02.   Decannulated on 1/20  1/22- healing well-c on't regimen   DIscussed vaccination post  hospitalization  10. GERD: Protonix bid not as effective and would like to get back on dexilant 11. Hyperglycemia due to tube feeds on prediabetes: Hgb A1C-5.8.  Levamir DC'd on 1/21  Will consider oral agent as necessary 12. Dysphagia:   Dysphagia # 1, nectar liquids.   Eating fairly well.  NG DC'd  Refused MBS on 1/18, plan again for today  Started calorie count to monitor intake.   AKI resolved with water flushes--need to encourage thickened fluids intake.   Advance diet as tolerated 13.  Transaminitis  ALT elevated on 1/13, improving, labs ordered for Monday  Continue to monitor 14.  Acute blood loss anemia  Hemoglobin 11.0 on 1/17, labs with Monday  Continue to monitor 15.  Sleep disturbance: patient requests Tramadol at night to help her sleep. She reports restless leg syndrome. Discussed trialing Requip 0.72m HS to help her sleep instead and she is agreeable.  Predominantly related to #4 16. Withdrawal symptoms: Previously patient reports jitteriness prior to receiving methadone in the morning, symptoms improve with methadone. She requests increase in Tramadol to help with symptoms. Discussed that this would worsen her symptoms long term. Encouraged continued slow wean while she is in [place] Discussed her desired to leave and what her plans are to obtain pain medication post-discharge. [facility]. She would like suboxone to be prescribed here- advised that certification is required to prescribe this medication and she expressed understanding.  1/22- asked again at [facility] that "we should give her suboxone here" explained we don't have license to do so, and we cannot- went on at length about this topic.   17.  Urinary urgency: UA unremarkable, decreased Lasix to 10 on 1/18   I spent a total of 35 minutes on pt- >50% coordination of care- trying to answer her questions and explained we are not doing increased meds, or  suboxone.    LOS: 10 days A FACE TO FACE EVALUATION WAS PERFORMED  Hareem Surowiec 03/15/2020, 1:18 PM

## 2020-03-16 ENCOUNTER — Inpatient Hospital Stay (HOSPITAL_COMMUNITY): Payer: Medicaid Other

## 2020-03-16 ENCOUNTER — Inpatient Hospital Stay (HOSPITAL_COMMUNITY): Payer: Medicaid Other | Admitting: Speech Pathology

## 2020-03-16 LAB — GLUCOSE, CAPILLARY
Glucose-Capillary: 105 mg/dL — ABNORMAL HIGH (ref 70–99)
Glucose-Capillary: 109 mg/dL — ABNORMAL HIGH (ref 70–99)
Glucose-Capillary: 109 mg/dL — ABNORMAL HIGH (ref 70–99)
Glucose-Capillary: 112 mg/dL — ABNORMAL HIGH (ref 70–99)
Glucose-Capillary: 124 mg/dL — ABNORMAL HIGH (ref 70–99)
Glucose-Capillary: 88 mg/dL (ref 70–99)
Glucose-Capillary: 97 mg/dL (ref 70–99)

## 2020-03-16 NOTE — Progress Notes (Signed)
[place] PHYSICAL MEDICINE & REHABILITATION PROGRESS NOTE  Subjective/Complaints:  Pt upset this AM- did sleep last night- but not until 3am when got prn xanax dose. She said she didn't dream, so needs more frequent Xanax dosing I got called at 1am because she was so anxious, and wouldn't take trazodone or Prazosin dose- refused to try even though yesterday said she would try.   Asked that I come back to the room ~ 11am, because she had "something very important to tell me"-at that time, was MORE anxious, pressured speech, and kept repeating herself, perseverating on increasing Methadone, Xanax or adding Suboxone which I'm explained 3+ times that the hospital does not prescribe.   Does say her mouth is dry, and "cannot talk and cannot breathe", but O2 sats are 98-100% and talked for a long period with no deficits.    ROS:  Pt denies abd pain, CP, N/V/C/D, and vision changes   Objective: Vital Signs: Blood pressure 122/84, pulse (!) 108, temperature 98.4 F (36.9 C), resp. rate 18, height 4' 11"  (1.499 m), weight 98.1 kg, last menstrual period 02/29/2020, SpO2 97 %. No results found. No results for input(s): WBC, HGB, HCT, PLT in the last 72 hours. No results for input(s): NA, K, CL, CO2, GLUCOSE, BUN, CREATININE, CALCIUM in the last 72 hours.  Intake/Output Summary (Last 24 hours) at 03/16/2020 1343 Last data filed at 03/16/2020 0848 Gross per 24 hour  Intake -  Output 225 ml  Net -225 ml        Physical Exam: BP 122/84 (BP Location: Right Arm)   Pulse (!) 108   Temp 98.4 F (36.9 C)   Resp 18   Ht 4' 11"  (1.499 m)   Wt 98.1 kg   LMP 02/29/2020 (Approximate)   SpO2 97%   BMI 43.68 kg/m   Constitutional: No distress .pressured speech, very anxious- refused to recenter self or take a deep breath 1st time I saw her, NAD HENT: Normocephalic.  Atraumatic. Neck: + Stoma mouth slightly dry- speech not impaired Eyes: EOMI. No discharge. Cardiovascular: slightly  tachycardic Respiratory: CTA B/L- no W/R/R- good air movement GI: Soft, NT, ND, (+)BS  Skin: Warm and dry.  Intact. Psych: anxious with pressure speech, insistent I get her some Suboxone immediately. Musc: No edema in extremities.  No tenderness in extremities. Neuro: Alert Motor: 4-/5 throughout, unchanged  Assessment/Plan: 1. Functional deficits which require 3+ hours per day of interdisciplinary therapy in a comprehensive inpatient rehab setting.  Physiatrist is providing close team supervision and 24 hour management of active medical problems listed below.  Physiatrist and rehab team continue to assess barriers to discharge/monitor patient progress toward functional and medical goals   Care Tool:  Bathing    Body parts bathed by patient: Right arm,Left arm,Chest,Abdomen,Front perineal area,Buttocks,Right upper leg,Left upper leg,Right lower leg,Left lower leg,Face         Bathing assist Assist Level: Supervision/Verbal cueing     Upper Body Dressing/Undressing Upper body dressing   What is the patient wearing?: Bra,Pull over shirt    Upper body assist Assist Level: Supervision/Verbal cueing    Lower Body Dressing/Undressing Lower body dressing      What is the patient wearing?: Pants     Lower body assist Assist for lower body dressing: Supervision/Verbal cueing     Toileting Toileting    Toileting assist Assist for toileting: Supervision/Verbal cueing     Transfers Chair/bed transfer  Transfers assist     Chair/bed transfer assist level:  Supervision/Verbal cueing     Locomotion Ambulation   Ambulation assist      Assist level: Supervision/Verbal cueing Assistive device: Walker-rolling Max distance: 14f   Walk 10 feet activity   Assist     Assist level: Supervision/Verbal cueing Assistive device: Walker-rolling   Walk 50 feet activity   Assist Walk 50 feet with 2 turns activity did not occur: Safety/medical concerns  Assist  level: Supervision/Verbal cueing Assistive device: Walker-rolling    Walk 150 feet activity   Assist Walk 150 feet activity did not occur: Safety/medical concerns         Walk 10 feet on uneven surface  activity   Assist Walk 10 feet on uneven surfaces activity did not occur: Safety/medical concerns         Wheelchair     Assist Will patient use wheelchair at discharge?: No Type of Wheelchair: Manual Wheelchair activity did not occur: Safety/medical concerns  Wheelchair assist level:  ([facility] however unable to assess during inital eval 2/2 fatigue and anxiety)      Wheelchair 50 feet with 2 turns activity    Assist    Wheelchair 50 feet with 2 turns activity did not occur: Safety/medical concerns       Wheelchair 150 feet activity     Assist  Wheelchair 150 feet activity did not occur: Safety/medical concerns        Medical Problem List and Plan: 1.  Decreased activity tolerance, hypoxia with activity, generalized weakness with flexed posture, narrow base of support with unsteadiness and LE fatigue with ambulation secondary to CIM.  Continue CIR 2.  Antithrombotics: -DVT/anticoagulation:  Pharmaceutical: Lovenox             -antiplatelet therapy: NA 3. Substance abuse/Pain Management:   On methadone, plan to wean             Tramadol added on 1/13  Dilaudid weaned and DC'd on 1/18  Patient would like to be referred to Suboxone clinic outpatient given her history of opioid addiction.   Discussed with outpatient Methadone clinic, plans for follow up on Monday  Controlled on 1/21 1/22- says pain "NOT controlled"- and took it 30 minutes prior- explained can take up to 1 hr to kick in- will not change meds per d/w Dr PPosey Pronto explained that to pt.  1/23- d/w dosage with Dr PPosey Pronto there are no changes to be made.   4. Mood: LCSW to follow for evaluation and support.              Appreciate neuropsych evaluation-working on  coping  See #8             -antipsychotic agents: N/A--Risperdal d/ced  5. Neuropsych: This patient is not fully capable of making decisions on her own behalf. 6. Skin/Wound Care: Routine pressure relief measures.  7. Fluids/Electrolytes/Nutrition: Monitor I/O.              BMP within acceptable range on 1/17, labs ordered for Monday 8. Anxiety disorder: Question of Bipolar d/o-->patient denies and reports mania due to multiple psych medications.   Does not want to try buspar or klonopin, SSRIs/SNRIs-states that everybody has an adverse reaction to them   1/23- offered again to try something else- she emphatically refused everything including prozosin, Trazodone for sleep, seroquel (took 1 dose, but made her have dry mouth).              See #4 9. VDRF secondary to Covid 19  PNA/sepsis: Last course of antibiotics for MSSA VAP completed 01/02.   Decannulated on 1/20  1/22- healing well-c on't regimen   DIscussed vaccination post hospitalization  10. GERD: Protonix bid not as effective and would like to get back on dexilant 11. Hyperglycemia due to tube feeds on prediabetes: Hgb A1C-5.8.  Levamir DC'd on 1/21  Will consider oral agent as necessary 12. Dysphagia:   Dysphagia # 1, nectar liquids.   Eating fairly well.  NG DC'd  Refused MBS on 1/18, plan again for today  Started calorie count to monitor intake.   AKI resolved with water flushes--need to encourage thickened fluids intake.   Advance diet as tolerated 13.  Transaminitis  ALT elevated on 1/13, improving, labs ordered for Monday  Continue to monitor 14.  Acute blood loss anemia  Hemoglobin 11.0 on 1/17, labs with Monday  Continue to monitor 15.  Sleep disturbance: patient requests Tramadol at night to help her sleep. She reports restless leg syndrome. Discussed trialing Requip 0.25m HS to help her sleep instead and she is agreeable.  Predominantly related to #4  1/23- said had to sleep- refused all options given- just wants  more xanax or suboxone.  16. Withdrawal symptoms: Previously patient reports jitteriness prior to receiving methadone in the morning, symptoms improve with methadone. She requests increase in Tramadol to help with symptoms. Discussed that this would worsen her symptoms long term. Encouraged continued slow wean while she is in [place] Discussed her desired to leave and what her plans are to obtain pain medication post-discharge. [facility]. She would like suboxone to be prescribed here- advised that certification is required to prescribe this medication and she expressed understanding.  1/22- asked again at [facility] that "we should give her suboxone here" explained we don't have license to do so, and we cannot- went on at length about this topic.   17.  Urinary urgency: UA unremarkable, decreased Lasix to 10 on 1/18   I spent a total of 50 minutes on care today-spoke to nurse, to Dr PPosey Pronto and to pt 2x- which took 10 minutes first time and 25 minutes 2nd time-  >50% coordination of care trying to calm pt down- she refused to take other meds that were offered for claustrophobia Sx's vs PTSD Sx's, which some trach/ICU post COVID pts have- also refused trazodone- upset that Seroquel must have caused "dry mouth"- explained a LOT of meds cause dry mouth and didn't see any issues with this on exam, except drinking more water. She could speak well/easily.  The seroquel that she refused to take again because eof dry mouth- is out ofh er system and still c/o severe dry mouth.  Refusing Magic mouthwash and oral rinse for dry mouth because _they don't work". I explainednothing works perfectly, usually requires multiple options- also the Sx's she's c/o anxiety-wise can be treated with the trazodone and Prazosin, but she's emphatic she will not try another medicine and what I need to do is give her Suboxone or increase Methadone dramatically or her Xanax- I  explained that isn't possible.     LOS: 11 days A FACE TO FACE EVALUATION WAS PERFORMED  Shontell Prosser 03/16/2020, 1:43 PM

## 2020-03-16 NOTE — Progress Notes (Signed)
Pt continues to self transfer. Pt turns off chair alarm per self and transfers to bathroom. Encouraged pt to call for help. MD Lovorn verbal order for pt to wheel down hallway for anxiety relief this AM. OVERALL, Pt anxiety seemed to decrease after hall passes today. Pt wore mask.  Sheela Stack, LPN

## 2020-03-16 NOTE — Progress Notes (Signed)
Patient in heightened state of anxiety over the majority of shift PRN and new medications offered with patient refusing medications because " they make my mouth dry and I don't want to take any new medications because I am afraid of the potential adverse reactions " emotional support given, patient ambulated in hallway, Charge nurse notified and  Patient given ice chips and chewing gum to moisten mouth. Patient is refusing medications prescribed by Dr. Dagoberto Ligas to help with anxiety  MD notified with New orders received by this nurse of max cap of 200 mg Trazodone if patient is willing to take it.

## 2020-03-16 NOTE — Progress Notes (Signed)
Physical Therapy Session Note  Patient Details  Name: Belinda [name] MRN: [id number] Date of Birth: 1980/05/30  Today's Date: 03/16/2020 PT Individual Time: 1440-1540 PT Individual Time Calculation (min): 60 min   Short Term Goals: Week 1:  PT Short Term Goal 1 (Week 1): pt to demonstrate supine<>sit supervision PT Short Term Goal 2 (Week 1): pt to demonstrate functional transfers with LRAD CGA consistently PT Short Term Goal 3 (Week 1): pt to demonstrate ambulation with LRAD 50' CGA Week 2:  PT Short Term Goal 1 (Week 2): STG = LTG due to ELOS  Skilled Therapeutic Interventions/Progress Updates:   Pt sound asleep; snoring.  Trach removed 1/21, and bandage in place.  She awakened easily to touch.  She denied pain.  O2 sats 98%, HR 112, on room air.  Supine> sit with supervision.  Pt stated that she needed to use toilet.  Gait with RW in room to toilet, CGA. Pt continent of bladder.  Toilet transfer with CGA.  Peri care and hand washing with supervision.  Gait training without AD, HHA x 70 ' on level tile, including 2 turns, CGA.  As pt fatigued, R foot with poor clearance; limited abiltity of pt to compensate by increased hip flexion.  O2 sats 97%, HR 103.   neuromuscular re-education via demo, multimodal cues for standing with 1UE support: mini squats with focus on hip flexion/extension, sustained stretch bil PFs and hamstrings with forefeet on wedge, x 2 minutes.   Seated: kicking of ball to facilitate R ankle DF. Ambulating x 35' : kicking Yoga block with R foot to facilitate clearance of RLE/foot.    At end of session, pt seated in wc with seat pad alarm set and needs at hand.     Therapy Documentation Precautions:  Precautions Precautions: Fall Precaution Comments:  watch HR, monitor BP Restrictions Weight Bearing Restrictions: No      Therapy/Group: Individual Therapy  Ashanti Ratti 03/16/2020, 3:53 PM

## 2020-03-17 ENCOUNTER — Inpatient Hospital Stay (HOSPITAL_COMMUNITY): Payer: Medicaid Other

## 2020-03-17 ENCOUNTER — Inpatient Hospital Stay (HOSPITAL_COMMUNITY): Payer: Medicaid Other | Admitting: Occupational Therapy

## 2020-03-17 ENCOUNTER — Other Ambulatory Visit: Payer: Self-pay | Admitting: Family Medicine

## 2020-03-17 ENCOUNTER — Telehealth: Payer: Self-pay | Admitting: Family Medicine

## 2020-03-17 LAB — COMPREHENSIVE METABOLIC PANEL
ALT: 38 U/L (ref 0–44)
AST: 27 U/L (ref 15–41)
Albumin: 3.3 g/dL — ABNORMAL LOW (ref 3.5–5.0)
Alkaline Phosphatase: 71 U/L (ref 38–126)
Anion gap: 11 (ref 5–15)
BUN: 5 mg/dL — ABNORMAL LOW (ref 6–20)
CO2: 27 mmol/L (ref 22–32)
Calcium: 9.3 mg/dL (ref 8.9–10.3)
Chloride: 102 mmol/L (ref 98–111)
Creatinine, Ser: 0.7 mg/dL (ref 0.44–1.00)
GFR, Estimated: >60 mL/min (ref >60)
Glucose, Bld: 94 mg/dL (ref 70–99)
Potassium: 3.4 mmol/L — ABNORMAL LOW (ref 3.5–5.1)
Sodium: 140 mmol/L (ref 135–145)
Total Bilirubin: 0.8 mg/dL (ref 0.3–1.2)
Total Protein: 6.7 g/dL (ref 6.5–8.1)

## 2020-03-17 LAB — GLUCOSE, CAPILLARY
Glucose-Capillary: 97 mg/dL (ref 70–99)
Glucose-Capillary: 107 mg/dL — ABNORMAL HIGH (ref 70–99)
Glucose-Capillary: 113 mg/dL — ABNORMAL HIGH (ref 70–99)
Glucose-Capillary: 86 mg/dL (ref 70–99)
Glucose-Capillary: 120 mg/dL — ABNORMAL HIGH (ref 70–99)

## 2020-03-17 LAB — CBC
HCT: 37.3 % (ref 36.0–46.0)
Hemoglobin: 11.6 g/dL — ABNORMAL LOW (ref 12.0–15.0)
MCH: 29.5 pg (ref 26.0–34.0)
MCHC: 31.1 g/dL (ref 30.0–36.0)
MCV: 94.9 fL (ref 80.0–100.0)
Platelets: 248 10*3/uL (ref 150–400)
RBC: 3.93 MIL/uL (ref 3.87–5.11)
RDW: 14.5 % (ref 11.5–15.5)
WBC: 5.8 10*3/uL (ref 4.0–10.5)
nRBC: 0 % (ref 0.0–0.2)

## 2020-03-17 MED ORDER — ENSURE ENLIVE PO LIQD
237.0000 mL | Freq: Three times a day (TID) | ORAL | Status: DC
  Administered 2020-03-17 – 2020-03-18 (×3): 237 mL via ORAL

## 2020-03-17 MED ORDER — POTASSIUM CHLORIDE CRYS ER 20 MEQ PO TBCR: 20.0000 meq | EXTENDED_RELEASE_TABLET | Freq: Every day | ORAL | Status: DC

## 2020-03-17 MED ORDER — ALPRAZOLAM 1 MG PO TABS: 1.0000 mg | ORAL_TABLET | Freq: Three times a day (TID) | ORAL | 0 refills | Status: DC | PRN

## 2020-03-17 MED ORDER — POTASSIUM CHLORIDE CRYS ER 20 MEQ PO TBCR
20.0000 meq | EXTENDED_RELEASE_TABLET | Freq: Two times a day (BID) | ORAL | Status: DC
  Administered 2020-03-17 (×2): 20 meq via ORAL
  Filled 2020-03-17 (×3): qty 1

## 2020-03-17 NOTE — Progress Notes (Signed)
Attempted to see/find patient x3 this morning.  Discussed with other therapists, on aware of whereabouts-with OT.  Discussed with PA.  Discussed with MD over weekend as well.  Please see PA note.

## 2020-03-17 NOTE — Discharge Summary (Signed)
Physical Therapy Discharge Summary  Patient Details  Name: CURLEY FAYETTE MRN: [id number] Date of Birth: [birth year]  Today's Date: 03/17/2020 PT Individual Time: 1037-1200 + 1415 - 1523 PT Individual Time Calculation (min): 83 min + 63 min   Patient has met 8 of 8 long term goals due to improved activity tolerance, improved balance and increased strength.  Patient to discharge at an ambulatory level Independent.   Patient's care partner is independent to provide the necessary physical assistance at discharge.  Reasons goals not met: n/a  Recommendation:  Patient will benefit from ongoing skilled PT services in outpatient setting to continue to advance safe functional mobility, address ongoing impairments in higher level balance deficits and cardiorespiratory endurance impairments in order to improve quality of life and reduce minimize fall risk.  Equipment: RW  Reasons for discharge: treatment goals met and discharge from hospital  Patient/family agrees with progress made and goals achieved: Yes  PT Discharge Precautions/Restrictions Precautions Precautions: Fall Precaution Comments: Stoma from trach. Monitor HR and O2 Restrictions Weight Bearing Restrictions: No Pain Pain Assessment Pain Score: 0-No pain Vision/Perception  Perception Perception: Within Functional Limits Praxis Praxis: Intact  Cognition Overall Cognitive Status: Within Functional Limits for tasks assessed Arousal/Alertness: Awake/alert Orientation Level: Oriented X4 Sustained Attention: Appears intact Memory: Appears intact (slightly impacted by anxiety) Memory Impairment: Decreased recall of new information Awareness: Appears intact Problem Solving: Appears intact Sequencing: Appears intact Sequencing Impairment: Verbal basic;Functional basic Behaviors: Restless Safety/Judgment: Appears intact Sensation Sensation Light Touch: Appears Intact Hot/Cold: Appears Intact Proprioception: Appears  Intact Stereognosis: Not tested Coordination Gross Motor Movements are Fluid and Coordinated: Yes Fine Motor Movements are Fluid and Coordinated: Yes Motor  Motor Motor: Within Functional Limits  Mobility Bed Mobility Bed Mobility: Rolling Right;Rolling Left;Supine to Sit;Sit to Supine Rolling Right: Independent Rolling Left: Independent Supine to Sit: Independent Sitting - Scoot to Edge of Bed: Independent Sit to Supine: Independent Transfers Transfers: Sit to Stand;Stand to [employer] Transfers Sit to Stand: Independent Stand to Sit: Independent Stand Pivot Transfers: Independent Transfer (Assistive device): None Locomotion  Gait Ambulation: Yes Gait Assistance: Independent Gait Distance (Feet): 750 Feet Assistive device: None Gait Gait: Yes Gait Pattern: Within Functional Limits Gait Pattern: Within Functional Limits;Decreased dorsiflexion - right;Decreased hip/knee flexion - right;Poor foot clearance - right Gait velocity: decreased Stairs / Additional Locomotion Stairs: Yes Stairs Assistance: Independent Stair Management Technique: Two rails Number of Stairs: 12 Height of Stairs: 6 Ramp: Independent Wheelchair Mobility Wheelchair Mobility: No  Trunk/Postural Assessment  Cervical Assessment Cervical Assessment: Within Functional Limits Thoracic Assessment Thoracic Assessment: Within Functional Limits Lumbar Assessment Lumbar Assessment: Within Functional Limits Postural Control Postural Control: Within Functional Limits  Balance Balance Balance Assessed: Yes Static Sitting Balance Static Sitting - Balance Support: Feet supported;No upper extremity supported Static Sitting - Level of Assistance: 7: Independent Dynamic Sitting Balance Dynamic Sitting - Balance Support: During functional activity Dynamic Sitting - Level of Assistance: 7: Independent Static Standing Balance Static Standing - Balance Support: No upper extremity supported Static  Standing - Level of Assistance: 7: Independent Dynamic Standing Balance Dynamic Standing - Balance Support: During functional activity Dynamic Standing - Level of Assistance: 7: Independent Extremity Assessment      RLE Assessment RLE Assessment: Within Functional Limits General Strength Comments: ankle PF 4/5, ankle DF 3+/5, knee ext 4/5, hip flex 4-/5 LLE Assessment LLE Assessment: Within Functional Limits General Strength Comments: Grossly 4+/5  Skilled Intervention:  1st session: Pt greeted sitting in w/c at the sink, performing hand  hygiene. No reports of pain. She continues to have plenty of questions and concerns regarding plan of care, trach care, follow up care, expectations of discharge, her progress with therapy, and other general health related questions. Lengthy discussion for answering all of her questions and concerns. Education on energy conservation, pacing activity, RPE scale, guided breathing exercises to improve lung capacity, COVID fog, and long COVID. Anxiety appears to still be limiting her at times, team is aware.   Fit her RW that was delivered to her room performed trial of gait with RW, ambulating 164f mod I. Ambulation for remaining of session without RW to focus on gait with LRAD. Pt progressed from supervision to indep with gait. She was able to ambulate >7530findep and vitals >95% oxygen with HR ~115. She was also able to ambulate up/down 1090famp indep with no AD.  Performed stair training where she went up/down x12 steps indep with 2 hand rails, no difficulty noted and pt was conversant throughout and no knee buckling or LOB noted.  Performed dynamic balance with rebounder, throwing ball in saggital plane and across frontal plane to focus on BUE coordination, core facilitation, righting reactions, and balance. Therapist providing close supervision while she completed this.  Performed furniture transfers in ADL apartment room indep as well as bed mobility  on regular bed independently. No issues.  Car transfer performed indep with car height set to replicate her sedan. No difficulty.  Performed several activities on the BITS system while standing with therapist providing supervision. She completed cognitive memory via verbal/visual sequencing and she was able to get up to 6 words correctly, unable to sequencing 7 words. She also completed reaction time test, unable to complete 50 stimuli in 60 seconds (got 47). Final activity performed with geometric shape replication where she was tasked to mimic the drawings and she was able to get up to level 6 prior to error.  Pt ended session seated in her w/c in her room with NT present for routine BG assessment. All needs within reach.  2nd session: Pt greeted sitting in w/c, agreeable to therapy. No reports of pain. Focus of session to perform long distance gait training within the hospital and outside, focusing on community reintegration, cardiorespiratory endurance, and functional gait.  She ambulated throughout hospital with indep and no AD, >1000f30fw only x3 brief seated rest breaks. Vitals with oxygen >95% and HR ~105-120. She was conversant throughout gait as well with no instances of knee buckling, LOB. Gait outside of main nortGarbe[place] entrance on unle[place]orox Companyhout difficulty. She also navigated up/down x5 + x14 steps within the hospital (b/w floors) with indep and 1 hand rail. Pt unable to recall directions back to her room from main entrance but was able to recall the correct tower and room #.   Performed Nustep x10 minutes at workload of 3, using both BUE's and BLE's, focusing on cardiorespiratory endurance and general strengthening. Mild trembling of BLE's towards the end of this but reports only mild-to-moderate fatigue.  Performed x6 minutes of seated bicycle ergometer at workload of 3, going x3 minutes in forwards direction and x2 minutes in backwards direction. Again showing mild  trembling in LUE with fatigue.   Educated her on gentle cervical ROM including lateral flexion, rotation, to promote cervical stretching s/p restrictions from her trach.. Continued education on home safety and DC planning. All of her questions and concerns have been addressed and repeated.   She ended session seated in w/c with needs  in reach. Provided her with Mod I sign in her room above headboard and notified RN.    Maigen Mozingo P Yashika Mask PT ,DPT 03/17/2020, 12:33 PM

## 2020-03-17 NOTE — Discharge Instructions (Signed)
Inpatient Rehab Discharge Instructions  SHAUNIECE KWAN Discharge date and time: 01/   Activities/Precautions/ Functional Status: Activity: no lifting, driving, or strenuous exercise till cleared by MD Diet:  Wound Care: keep wound clean and dry    Functional status:  ___ No restrictions     ___ Walk up steps independently ___ 24/7 supervision/assistance   ___ Walk up steps with assistance ___ Intermittent supervision/assistance  ___ Bathe/dress independently ___ Walk with walker     ___ Bathe/dress with assistance ___ Walk Independently    ___ Shower independently ___ Walk with assistance    ___ Shower with assistance ___ No alcohol     ___ Return to work/school ________  Special Instructions:    COMMUNITY REFERRALS UPON DISCHARGE:    Home EXERCISE PROGRAM GIVEN TO PATIENT   Medical Equipment/Items Ordered:ROLLING WALKER, 3 IN 1 AND TUB SEAT- THIS ITEM TO BE SHIPPED TO [facility]                                                 Agency/Supplier:[facility]  [phone]     My questions have been answered and I understand these instructions. I will adhere to these goals and the provided educational materials after my discharge from the hospital.  Patient/Caregiver Signature _______________________________ Date __________  Clinician Signature _______________________________________ Date __________  Please bring this form and your medication list with you to all your follow-up doctor's appointments.

## 2020-03-17 NOTE — Telephone Encounter (Signed)
Patient is requesting refill on xanax 1 mg .Belinda KitchenShe is getting out of hospital tomorrow due to Covid. [facility] in [place]

## 2020-03-17 NOTE — Progress Notes (Signed)
Patient ID: Belinda [name], female   DOB: [birth year], 40 y.o.   MRN: [id number]  Met with pt to see if feels prepared for discharge tomorrow. Her Mom did not come in but pt is moving quite well around her room,aware needs to call for assist and should e using her rolling walker. Equipment in room for home and aware tub seat to be shipped to home. Will have home exercise program due to can not find home health due to medicaid coverage. She feels not necessary anyway. She is happy pam-PA has gotten her into suboxone clinic close to her home. She has an appointment on Wed 1/26. Ready for discharge tomorrow.

## 2020-03-17 NOTE — Telephone Encounter (Signed)
Xanax 1 mg 1 taken 3 times daily is the maximum that I am comfortable doing, I will send this in today Patient will need to schedule a follow-up office visit within the next 3 weeks Thank you

## 2020-03-17 NOTE — Telephone Encounter (Signed)
Nurses I have already sent in the Xanax This patient was on 4 times a day before her hospitalization Ever since she has been off the ventilator they have been doing the Xanax 3 times a day This is a safer dose This is what I feel comfortable prescribing I will not be prescribing 4 times a day (FYI confidentially there are also some medication issues going up with this patient, therefore I will not be doing 4 times a day when I see her we will be discussing some of these other issues including having her see psychiatry)

## 2020-03-17 NOTE — Progress Notes (Signed)
Labs with mild hypokalemia --will supplement X 3 doses as no low dose lasix. Appointment has been set for patient with Dr. Valetta Close for Jan 26th at 8 am--patient informed in hopes that it will alleviate anxiety. She has been refusing trazodone and prazosin. Should have 10 day supple of Xanax at home--note that she has already called PCP for refills.

## 2020-03-17 NOTE — Progress Notes (Signed)
Inpatient Rehabilitation Care Coordinator Discharge Note  The overall goal for the admission was met for:   Discharge location: Yes-home with 39 yo son and fiance-mom to assist while son and fiance at work  Length of Stay: Yes-13 days  Discharge activity level: Yes-supervision-mod/i level  Home/community participation: Yes  Services provided included: MD, RD, PT, OT, SLP, RN, CM, Pharmacy, Neuropsych and SW  Financial Services: Medicaid Choices offered to/list presented to:yes  Follow-up services arranged: DME: [employer]-rolling walker, 3 in 1 and tub seat and Patient/Family has no preference for HH/DME agencies  Comments (or additional information):pt did well and reached supervision-mod/i level. Mom to provide assist at discharge. Pt to follow up with the suboxone clinic appointment 1/27 in [place] [facility]  Patient/Family verbalized understanding of follow-up arrangements: Yes  Individual responsible for coordination of the follow-up plan: diane-mom [phone]  Confirmed correct DME delivered: ,  G 03/17/2020    ,  G 

## 2020-03-17 NOTE — Telephone Encounter (Signed)
Please make sure the patient understands that we are only prescribing 3 times per day

## 2020-03-17 NOTE — Telephone Encounter (Signed)
Do you want nurses to set up med for you to sign. If so what quantity do you want to give. Will call pt after med is sent to pharm.

## 2020-03-17 NOTE — Progress Notes (Signed)
Occupational Therapy Session Note  Patient Details  Name: Belinda [name] MRN: [id number] Date of Birth: 06-05-1980  Today's Date: 03/17/2020 OT Individual Time: 6962-9528 OT Individual Time Calculation (min): 55 min   Skilled Therapeutic Interventions/Progress Updates:    Pt greeted EOB, opting to shower later but requesting to "roll around" to ease up her "anxiety." CGA for short distance ambulatory transfer to w/c without AD use. Worked on UB strengthening/endurance and emotional coping by self propelling w/c in the community setting of the hospital. Pt reporting this was "chicken soup for the mind," affect bright and pt visibly less anxious compared to when OT first saw her this morning. We discussed emotional coping strategies, pt reporting that she used to play the trumpet and swim PTA. Encouraged her to continue with swimming once she was physically able and cleared by MD to do so, encouraged her to start trying to play the trumpet to work on volume of voice/strengthening of respiratory muscles when she returned home. She also likes to write. We talked about the therapeutic value of writing her medical journal and experiences during the pandemic. Pt very receptive to this idea so OT provided her with 2 small journals and writing implements to encourage her to begin writing while still at CIR. When pt returned to the unit she was left in care of RN for morning medicine.   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 Monitor HR and O2 Restrictions Weight Bearing Restrictions: No Pain: some general soreness however pt reported she slept better last night than she had during her hospitalization. Pt left in care of RN to receive her pain medicine Pain Assessment Pain Score: 0-No pain ADL: ADL Grooming: Setup Where Assessed-Grooming: Sitting at sink Upper Body Bathing: Supervision/safety Where Assessed-Upper Body Bathing: Sitting  at sink Lower Body Bathing: Contact guard Where Assessed-Lower Body Bathing: Sitting at sink,Standing at sink Upper Body Dressing: Minimal assistance Where Assessed-Upper Body Dressing: Sitting at sink Lower Body Dressing: Contact guard Where Assessed-Lower Body Dressing: Sitting at sink,Standing at sink Toileting: Contact guard Where Assessed-Toileting: Bedside Commode Toilet Transfer: Contact guard Toilet Transfer Method: Stand pivot Toilet Transfer Equipment: Bedside commode      Therapy/Group: Individual Therapy  Nasteho Glantz A Kenley Rettinger 03/17/2020, 1:00 PM

## 2020-03-17 NOTE — Progress Notes (Signed)
Reinforced to pt that she will only be receiving xanax TID per MD Sallee Lange. and that is what he advised. Belinda Stack, LPN

## 2020-03-17 NOTE — Telephone Encounter (Signed)
Called and discussed with pt. Pt verbalized understanding but states 3 a day is not enough for her anxiety. I did transfer her up to schedule a hospital follow up and told pt she could discuss at that time but 3 a day is what dr Nicki Reaper felt comfortable prescribing that what was safe for her since she was getting 3 a day in the hospital.

## 2020-03-18 ENCOUNTER — Other Ambulatory Visit (HOSPITAL_COMMUNITY): Payer: Self-pay | Admitting: Physical Medicine and Rehabilitation

## 2020-03-18 LAB — GLUCOSE, CAPILLARY: Glucose-Capillary: 93 mg/dL (ref 70–99)

## 2020-03-18 MED ORDER — MELATONIN 3 MG PO TABS: 3.0000 mg | ORAL_TABLET | Freq: Every evening | ORAL | 0 refills | Status: DC | PRN

## 2020-03-18 MED ORDER — METOPROLOL TARTRATE 25 MG PO TABS: 12.5000 mg | ORAL_TABLET | Freq: Two times a day (BID) | ORAL | 0 refills | Status: DC

## 2020-03-18 MED ORDER — ALPRAZOLAM 0.5 MG PO TABS
1.0000 mg | ORAL_TABLET | Freq: Once | ORAL | Status: AC
  Administered 2020-03-18: 1 mg via ORAL

## 2020-03-18 MED ORDER — FLUTICASONE PROPIONATE 50 MCG/ACT NA SUSP: 2.0000 | Freq: Every day | NASAL | 0 refills | Status: DC

## 2020-03-18 MED ORDER — AYR SALINE NASAL NA GEL: 1.0000 "application " | Freq: Four times a day (QID) | NASAL | 0 refills | Status: DC

## 2020-03-18 MED ORDER — THIAMINE HCL 100 MG PO TABS: 100.0000 mg | ORAL_TABLET | Freq: Every day | ORAL | 0 refills | Status: DC

## 2020-03-18 MED ORDER — PANTOPRAZOLE SODIUM 40 MG PO TBEC: 40.0000 mg | DELAYED_RELEASE_TABLET | Freq: Two times a day (BID) | ORAL | 0 refills | Status: DC

## 2020-03-18 MED ORDER — METHADONE HCL 10 MG PO TABS: 10.0000 mg | ORAL_TABLET | Freq: Two times a day (BID) | ORAL | 0 refills | Status: DC

## 2020-03-18 MED ORDER — FEXOFENADINE HCL 60 MG PO TABS: 60.0000 mg | ORAL_TABLET | Freq: Two times a day (BID) | ORAL | 0 refills | Status: DC

## 2020-03-18 MED ORDER — TRAZODONE HCL 100 MG PO TABS
100.0000 mg | ORAL_TABLET | Freq: Every evening | ORAL | 0 refills | Status: DC | PRN
Start: 2020-03-18 — End: 2020-04-07

## 2020-03-18 MED ORDER — DEXLANSOPRAZOLE 60 MG PO CPDR: 60.0000 mg | DELAYED_RELEASE_CAPSULE | Freq: Every day | ORAL | 0 refills | Status: DC

## 2020-03-18 MED ORDER — ADULT MULTIVITAMIN W/MINERALS CH: 1.0000 | ORAL_TABLET | Freq: Every day | ORAL | Status: DC

## 2020-03-18 MED ORDER — FUROSEMIDE 20 MG PO TABS: 10.0000 mg | ORAL_TABLET | Freq: Every day | ORAL | 0 refills | Status: DC

## 2020-03-18 MED ORDER — CLOBETASOL PROPIONATE 0.05 % EX SOLN: CUTANEOUS | 0 refills | Status: DC

## 2020-03-18 MED FILL — traZODone HCL 100 MG TABS: 100 | 30 days supply | Qty: 30 | Fill #0

## 2020-03-18 MED FILL — VITAMIN B-1 100 MG TABS: 100 | 30 days supply | Qty: 30 | Fill #0

## 2020-03-18 MED FILL — METOPROLOL TARTRATE 25 MG T: 25 | 30 days supply | Qty: 30 | Fill #0

## 2020-03-18 MED FILL — METHADONE HCL 10 MG TABLET: 10 | 1 days supply | Qty: 2 | Fill #0

## 2020-03-18 MED FILL — PANTOPRAZOLE SOD DR 40 MG T: 40 | 30 days supply | Qty: 60 | Fill #0

## 2020-03-18 MED FILL — FUROSEMIDE 20 MG TAB: 20 | 30 days supply | Qty: 30 | Fill #0

## 2020-03-18 MED FILL — SM NASAL SPRAY SALINE 0.65: 0.65 | 10 days supply | Qty: 88 | Fill #0

## 2020-03-18 MED FILL — FLUTICASONE PROP 50 MCG SPR: 50 | 30 days supply | Qty: 16 | Fill #0

## 2020-03-18 NOTE — Progress Notes (Signed)
[facility] PHYSICAL MEDICINE & REHABILITATION PROGRESS NOTE  Subjective/Complaints: Patient seen sitting up in her chair and ambulating in the room this AM.  She states she did not sleep well overnight because she was anxious about discharge she has multiple questions regarding discharge, medications, anxiety, prognosis, safety, vaccination, etc.  ROS: Denies CP, SOB, N/V/D  Objective: Vital Signs: Blood pressure 123/70, pulse 69, temperature 98.1 F (36.7 C), temperature source Oral, resp. rate 16, height 4' 11"  (1.499 m), weight 100.7 kg, last menstrual period 02/29/2020, SpO2 100 %. No results found. Recent Labs    03/17/20 0550  WBC 5.8  HGB 11.6*  HCT 37.3  PLT 248   Recent Labs    03/17/20 0550  NA 140  K 3.4*  CL 102  CO2 27  GLUCOSE 94  BUN 5*  CREATININE 0.70  CALCIUM 9.3    Intake/Output Summary (Last 24 hours) at 03/18/2020 1238 Last data filed at 03/17/2020 1823 Gross per 24 hour  Intake 340 ml  Output --  Net 340 ml        Physical Exam: BP 123/70 (BP Location: Left Arm)   Pulse 69   Temp 98.1 F (36.7 C) (Oral)   Resp 16   Ht 4' 11"  (1.499 m)   Wt 100.7 kg   LMP 02/29/2020 (Approximate)   SpO2 100%   BMI 44.84 kg/m   Constitutional: No distress . Vital signs reviewed. HENT: Normocephalic.  Atraumatic. Neck: Stoma closing Eyes: EOMI. No discharge. Cardiovascular: No JVD.  RRR. Respiratory: Normal effort.  No stridor.  Bilateral clear to auscultation. GI: Non-distended.  BS +. Skin: Warm and dry.  Intact. Psych: Anxious.  Tearful. Musc: No edema in extremities.  No tenderness in extremities. Neuro: Alert Motor: 4-/5 throughout, improving  Assessment/Plan: 1. Functional deficits which require 3+ hours per day of interdisciplinary therapy in a comprehensive inpatient rehab setting.  Physiatrist is providing close team supervision and 24 hour management of active medical problems listed below.  Physiatrist and rehab team continue to  assess barriers to discharge/monitor patient progress toward functional and medical goals   Care Tool:  Bathing    Body parts bathed by patient: Right arm,Left arm,Chest,Abdomen,Front perineal area,Buttocks,Right upper leg,Left upper leg,Right lower leg,Left lower leg,Face         Bathing assist Assist Level: Supervision/Verbal cueing     Upper Body Dressing/Undressing Upper body dressing   What is the patient wearing?: Bra,Pull over shirt    Upper body assist Assist Level: Supervision/Verbal cueing    Lower Body Dressing/Undressing Lower body dressing      What is the patient wearing?: Pants     Lower body assist Assist for lower body dressing: Supervision/Verbal cueing     Toileting Toileting    Toileting assist Assist for toileting: Supervision/Verbal cueing     Transfers Chair/bed transfer  Transfers assist     Chair/bed transfer assist level: Independent     Locomotion Ambulation   Ambulation assist      Assist level: Independent Assistive device: No Device Max distance: 750'   Walk 10 feet activity   Assist     Assist level: Independent Assistive device: No Device   Walk 50 feet activity   Assist Walk 50 feet with 2 turns activity did not occur: Safety/medical concerns  Assist level: Independent Assistive device: No Device    Walk 150 feet activity   Assist Walk 150 feet activity did not occur: Safety/medical concerns  Assist level: Independent Assistive device: No Device  Walk 10 feet on uneven surface  activity   Assist Walk 10 feet on uneven surfaces activity did not occur: Safety/medical concerns   Assist level: Supervision/Verbal cueing Assistive device: Other (comment) (no device)   Wheelchair     Assist Will patient use wheelchair at discharge?: No Type of Wheelchair: Manual Wheelchair activity did not occur: Safety/medical concerns  Wheelchair assist level:  (pt would benefit from [facility] assessment  however unable to assess during inital eval 2/2 fatigue and anxiety)      Wheelchair 50 feet with 2 turns activity    Assist    Wheelchair 50 feet with 2 turns activity did not occur: Safety/medical concerns       Wheelchair 150 feet activity     Assist  Wheelchair 150 feet activity did not occur: Safety/medical concerns        Medical Problem List and Plan: 1.  Decreased activity tolerance, hypoxia with activity, generalized weakness with flexed posture, narrow base of support with unsteadiness and LE fatigue with ambulation secondary to CIM.  DC today 2.  Antithrombotics: -DVT/anticoagulation:  Pharmaceutical: Lovenox             -antiplatelet therapy: NA 3. Substance abuse/Pain Management:   On methadone, patient to follow-up with methadone clinic tomorrow a.m.-appointment already made.             Tramadol added on 1/13  Dilaudid weaned and DC'd on 1/18  Patient would like to be referred to Suboxone clinic outpatient given her history of opioid addiction.   Controlled on 1/25 4. Mood: LCSW to follow for evaluation and support.              Appreciate neuropsych evaluation-working on coping  See #8             -antipsychotic agents: N/A--Risperdal d/ced  5. Neuropsych: This patient is not fully capable of making decisions on her own behalf. 6. Skin/Wound Care: Routine pressure relief measures.  7. Fluids/Electrolytes/Nutrition: Monitor I/O.              BMP within acceptable range on 1/17, labs ordered for Monday 8. Anxiety disorder: Question of Bipolar d/o-->patient denies and reports mania due to multiple psych medications.   Does not want to try buspar or klonopin, SSRIs/SNRIs-states that everybody has an adverse reaction to them   Refusing prozosin, Trazodone for sleep, seroquel (took 1 dose, but made her have dry mouth).              See #4  Xanax 3 times daily-patient was on this PTA and PCP to discuss/prescriber discharge 9. VDRF secondary to Covid 19  PNA/sepsis: Last course of antibiotics for MSSA VAP completed 01/02.   Decannulated on 1/20 without issue  DIscussed vaccination post hospitalization  10. GERD: Protonix bid not as effective and would like to get back on dexilant 11. Hyperglycemia due to tube feeds on prediabetes: Hgb A1C-5.8.  Levamir DC'd on 1/21  Relatively controlled, monitor in ambulatory setting with potential further adjustments as necessary 12. Dysphagia:   Dysphagia # 1, thin liquids.   Eating fairly well.  NG DC'd  Started calorie count to monitor intake.   AKI resolved with water flushes--need to encourage thickened fluids intake.   Advance diet as tolerated 13.  Transaminitis: Resolved  Continue to monitor 14.  Acute blood loss anemia  Hemoglobin 11.6 on 1/24  Continue to monitor 15.  Sleep disturbance: patient requests Tramadol at night to help her sleep. She reports restless leg  syndrome. Discussed trialing Requip 0.78m HS to help her sleep instead and she is agreeable.  Predominantly related to #4 16. Urinary urgency: UA unremarkable, decreased Lasix to 10 on 1/18  > 30 minutes spent in total in discharge planning between myself and PA regarding aforementioned, as well discussion regarding DME equipment, follow-up appointments, follow-up therapies, discharge medications, discharge recommendations, answering questions, reassuring patient  LOS: 13 days A FACE TO FACE EVALUATION WAS PERFORMED  Belinda [name] ALorie Phenix1/25/2022, 12:38 PM

## 2020-03-18 NOTE — Progress Notes (Signed)
Received verbal orders from MD Posey Pronto to give 35m of XANAX once for pt increased anxiety. Medication administered. Pt also self transferring in room and standing up with out assistance. Reminded pt to utilize safety precautions. ASheela Stack LPN

## 2020-03-18 NOTE — Progress Notes (Signed)
PA in to discuss discharge instructions with pt/family. No further questions from pt/family. Pt/family in agreement. Pt belongings gathered. Pt left per wheelchair to private vehicle. No complications noted. Sheela Stack, LPN

## 2020-03-18 NOTE — Discharge Summary (Addendum)
Physician Discharge Summary  Patient ID: Belinda [name] MRN: [id number] DOB/AGE: 05/04/1980 40 y.o.  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   Urinary frequency   Prediabetes   Elevated prolactin level   Discharged Condition: stable   Significant Diagnostic Studies: DG Swallowing Func-Speech Pathology  Result Date: 03/14/2020 Objective Swallowing Evaluation: Type of Study: MBS-Modified Barium Swallow Study  Patient Details Name: Belinda [name] MRN: [id number] Date of Birth: [birth year] Today's Date: 03/14/2020 Time: SLP Start Time (ACUTE ONLY): 34 -SLP Stop Time (ACUTE ONLY): 1053 SLP Time Calculation (min) (ACUTE ONLY): 13 min Past Medical History: Past Medical History: Diagnosis Date  Anxiety   compulsive worring, phobia  Anxiety   Anxiety disorder   Asthma   Bipolar affective ([place])   Fatty liver 11/14/2018  Fatty liver   GAD (generalized anxiety disorder)   with panic attacks  GERD (gastroesophageal reflux disease)   Morbid obesity ([place])   Narcotic addiction ([place])   Narcotic drug use   OCD (obsessive compulsive disorder)   Psoriasis   Reactive airways dysfunction syndrome ([place])   Vaginal Pap smear, abnormal  Past Surgical History: Past Surgical History: Procedure Laterality Date  CESAREAN SECTION    twice  COLPOSCOPY    DILATION AND CURETTAGE OF UTERUS    ESOPHAGOGASTRODUODENOSCOPY  07/2018  ESOPHAGOGASTRODUODENOSCOPY (EGD) WITH PROPOFOL N/A 08/03/2018  Procedure: ESOPHAGOGASTRODUODENOSCOPY (EGD) WITH PROPOFOL;  Surgeon: Daneil Dolin, MD;  Location: [facility];  Service: Endoscopy;  Laterality: N/A;  11:00am  PID   HPI: Pt is a 40 year old female with medical history significant for obesity.  She presented to [facility] ED on 12/15 with 24 to 48 hours of AMS  following exposure to her COVID+ son. CXR showed diffuse bilateral airspace disease, she was confused and agitated. Physical restraints applied on admission and Precedex infusion given.  She required 100% nonrebreather mask and was transferred to [facility] for further intervention. CXR 12/29: Improved bilateral ventilation with residual confluent bibasilar opacity. CT head negative. ETT 12/17-trach 12/28. Stat CT head completed on 1/3 due to increased weakness noted by PT and was negative.  No data recorded Assessment / Plan / Recommendation CHL IP CLINICAL IMPRESSIONS 03/14/2020 Clinical Impression Patient presents with an oropharyngeal swallow that is largely WFL-WNL and without aspiration. She had one instance of very trace flash penetration with straw sip of thin liquids, with full clearance from laryngeal vestibule. No pharyngeal residuals observed s/p initial swallows. Patient did present with suspected dysmotility of boluses at level of upper thoracic esophagus, but this did appear to clear with sips of regular thin water. (no radiologist present to confirm). Based on today's MBS, patient is safe for all solid and liquid textures and does not require any swallow precautions or f/u from SLP services. Of note, patient chose to not trial any solids beyond purees because of her lack of dentition. Patient is safe to resume solids without SLP intervention. SLP Visit Diagnosis Dysphagia, pharyngeal phase (R13.13) Attention and concentration deficit following -- Frontal lobe and executive function deficit following -- Impact on safety and function No limitations   CHL IP TREATMENT RECOMMENDATION 03/14/2020 Treatment Recommendations No treatment recommended at this time   Prognosis 02/27/2020 Prognosis for Safe Diet Advancement Good Barriers to Reach Goals -- Barriers/Prognosis Comment --  CHL IP DIET RECOMMENDATION 03/14/2020 SLP Diet Recommendations Thin liquid;Dysphagia 3 (Mech soft) solids Liquid Administration via  Straw;Cup Medication Administration Whole meds with puree Compensations Slow rate;Small sips/bites Postural Changes Seated upright at 90 degrees   CHL IP OTHER RECOMMENDATIONS 03/14/2020 Recommended Consults -- Oral Care Recommendations Oral care BID;Patient independent with oral care Other Recommendations --   CHL IP FOLLOW UP RECOMMENDATIONS 03/14/2020 Follow up Recommendations None   CHL IP FREQUENCY AND DURATION 02/27/2020 Speech Therapy Frequency (ACUTE ONLY) min 2x/week Treatment Duration 2 weeks      CHL IP ORAL PHASE 03/14/2020 Oral Phase WFL Oral - Pudding Teaspoon -- Oral - Pudding Cup -- Oral - Honey Teaspoon -- Oral - Honey Cup -- Oral - Nectar Teaspoon -- Oral - Nectar Cup -- Oral - Nectar Straw -- Oral - Thin Teaspoon -- Oral - Thin Cup -- Oral - Thin Straw -- Oral - Puree -- Oral - Mech Soft -- Oral - Regular -- Oral - Multi-Consistency -- Oral - Pill -- Oral Phase - Comment --  CHL IP PHARYNGEAL PHASE 03/14/2020 Pharyngeal Phase Impaired Pharyngeal- Pudding Teaspoon -- Pharyngeal -- Pharyngeal- Pudding Cup -- Pharyngeal -- Pharyngeal- Honey Teaspoon -- Pharyngeal -- Pharyngeal- Honey Cup NT Pharyngeal -- Pharyngeal- Nectar Teaspoon -- Pharyngeal -- Pharyngeal- Nectar Cup NT Pharyngeal -- Pharyngeal- Nectar Straw NT Pharyngeal -- Pharyngeal- Thin Teaspoon NT Pharyngeal -- Pharyngeal- Thin Cup [facility] Pharyngeal Material does not enter airway Pharyngeal- Thin Straw Penetration/Aspiration during swallow Pharyngeal Material does not enter airway;Material enters airway, remains ABOVE vocal cords then ejected out Pharyngeal- Puree [facility] Pharyngeal Material does not enter airway Pharyngeal- Mechanical Soft NT Pharyngeal -- Pharyngeal- Regular -- Pharyngeal -- Pharyngeal- Multi-consistency -- Pharyngeal -- Pharyngeal- Pill [facility] Pharyngeal Material does not enter airway Pharyngeal Comment --  CHL IP CERVICAL ESOPHAGEAL PHASE 03/14/2020 Cervical Esophageal Phase WFL Pudding Teaspoon -- Pudding Cup -- Honey Teaspoon -- Honey  Cup -- Nectar Teaspoon -- Nectar Cup -- Nectar Straw -- Thin Teaspoon -- Thin Cup -- Thin Straw -- Puree -- Mechanical Soft -- Regular -- Multi-consistency -- Pill -- Cervical Esophageal Comment -- Sonia Baller, MA, CCC-SLP Speech Therapy               Labs:  Basic Metabolic Panel: BMP Latest Ref Rng & Units 03/17/2020 03/10/2020 03/06/2020  Glucose 70 - 99 mg/dL 94 106(H) 100(H)  BUN 6 - 20 mg/dL 5(L) 8 10  Creatinine 0.44 - 1.00 mg/dL 0.70 0.62 0.63  BUN/Creat Ratio 9 - 23 - - -  Sodium 135 - 145 mmol/L 140 135 137  Potassium 3.5 - 5.1 mmol/L 3.4(L) 3.9 3.8  Chloride 98 - 111 mmol/L 102 98 101  CO2 22 - 32 mmol/L 27 25 24   Calcium 8.9 - 10.3 mg/dL 9.3 9.0 9.1    CBC: CBC Latest Ref Rng & Units 03/17/2020 03/10/2020 03/06/2020  WBC 4.0 - 10.5 K/uL 5.8 5.9 6.7  Hemoglobin 12.0 - 15.0 g/dL 11.6(L) 11.0(L) 10.4(L)  Hematocrit 36.0 - 46.0 % 37.3 33.2(L) 32.6(L)  Platelets 150 - 400 K/uL 248 344 323    CBG: Recent Labs  Lab 03/17/20 0831 03/17/20 1150 03/17/20 1639 03/17/20 2100 03/18/20 0605  GLUCAP 107* 113* 86 120* 93    Brief HPI:   MALAKA RUFFNER is a 40 y.o. female with history of anxiety disorder, morbid obesity, GERD who was originally admitted on 01/27/2020 with hypoxia, confusion and delirium with hypotension due to COVID-19 PNA.  She was treated with Covid protocol and hospital  course was significant for issues with agitation and combativeness as well as worsening of respiratory status with due to ARDS.  She required intubation family reported that patient had been taking her fianc Suboxone for the past year.  She was started on methadone as well as Dilaudid for due to concerns of drug withdrawal as well as as needed Haldol.  EEG done was negative for encephalopathy.  She required tracheostomy and was weaned to ATC.  On 01/03, she was found to have episode of disconjugate gaze as well as question of left greater than right-sided weakness.  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  Work-up revealed elevated prolactin level and endocrine follow-up recommended.  Mentation was improving and she was being tapered off Dilaudid and methadone.  She intermittent IV diuresis for fluid overload.  Risperdal was discontinued due to concerns of tremors/drug side effects and home Xanax was resumed to help manage high levels of anxiety.  Her p.o. intake remains poor on dysphagia two diet with honey liquids therefore tube feeds were ongoing.  She was downsized to a cuffless #6 prior to discharge.  Therapy was ongoing and patient was limited by decrease in activity tolerance, hypoxia with activity, weakness with narrow base of support and unsteadiness as well as fatigue affecting overall functional status.  CIR was recommended to functional decline.   Hospital Course: Belinda [name] was admitted to rehab 03/05/2020 for inpatient therapies to consist of PT, ST and OT at least three hours five days a week. Past admission physiatrist, therapy team and rehab RN have worked together to provide customized collaborative inpatient rehab.  Her respiratory status was stable however anxiety has played a big part during her overall stay.  She tolerated plugging and was decannulated without difficulty.  She continues on Xanax 1 mg 3 times daily at this time.  Dilaudid was weaned off and methadone was decreased to 10 mg p.o. twice daily.  She has refused prazosin trazodone or Seroquel to help with mood stabilization.  Dr. Christian Mate neuropsychologist has worked with patient on coping and adjustment issues to help deal with her anxiety symptoms and post Covid recovery.   She feeds were ongoing initially due to poor p.o. intake.  Levemir was used due to prediabetes with hemoglobin A1c of 5.8.  As intake improved tube feeds and Levemir was discontinued.  Her swallow function has improved and she  has been advanced to regular textures, thin liquids.  Follow-up labs showed abnormal LFTs have resolved.  Check of electrolytes showed mild hypokalemia which was supplemented briefly.  She was also educated on high potassium foods as has difficulty swallowing Kdur.  Lasix was decreased to 10 mEq to decrease frequency.  Follow-up CBC shows acute blood loss anemia is resolving.  Her blood pressures have been monitored on twice daily basis and have been relatively controlled.  She has high anxiety about being weaned off methadone and was referred to Suboxone clinic per her preference.  She has been educated on importance of compliance as well as close follow-up with primary care as well as counseling post discharge.  She has made good gains during her stay and currently is at supervision to modified independent level.     Rehab course: During patient's stay in rehab weekly team conferences were held to monitor patient's progress, set goals and discuss barriers to discharge. At admission, patient required mod assist with mobility and  min assist with ADL tasks. She  has had improvement in activity tolerance, balance, postural control as well as ability to compensate for deficits.  She is able to complete ADL tasks at modified independent level.  She is modified independent for transfers and is able to ambulate 750 feet without assistive device.  Speech therapy has worked with patient on dysphagia treatment and signed off as swallow function had improved.  Family education was completed with mother regarding concerns of safety and need for supervision past discharge.   Discharge disposition: 01-Home or Self Care  Diet: Regular.  Special Instructions: 1.  Will need referral to endocrine for evaluation of elevated prolactin level.   Allergies as of 03/18/2020       Reactions   Buspar [buspirone]    Mania/insomnia   Celexa [citalopram Hydrobromide]    Insomnia   Celexa [citalopram]    mania   Doxycycline  Diarrhea   diarrhea   Wellbutrin [bupropion]    Increased anxiety   Wellbutrin [bupropion]    insomnia   Zoloft [sertraline Hcl]    Increased anxiety   Zoloft [sertraline]    mania   Augmentin [amoxicillin-pot Clavulanate]    Nausea and vomiting with Augmentin, no rash   Doxycycline Rash        Medication List     STOP taking these medications    albuterol (2.5 MG/3ML) 0.083% nebulizer solution Commonly known as: PROVENTIL   albuterol 108 (90 Base) MCG/ACT inhaler Commonly known as: VENTOLIN HFA   cefdinir 300 MG capsule Commonly known as: OMNICEF   cetirizine 10 MG tablet Commonly known as: ZYRTEC   docusate sodium 100 MG capsule Commonly known as: Colace   famotidine 40 MG tablet Commonly known as: PEPCID   Flovent HFA 110 MCG/ACT inhaler Generic drug: fluticasone   fluticasone 110 MCG/ACT inhaler Commonly known as: FLOVENT HFA   gabapentin 300 MG capsule Commonly known as: NEURONTIN   HYDROmorphone 2 MG tablet Commonly known as: DILAUDID   linaclotide 290 MCG Caps capsule Commonly known as: LINZESS   Linzess 290 MCG Caps capsule Generic drug: linaclotide       TAKE these medications    ALPRAZolam 1 MG tablet Commonly known as: XANAX Take 1 tablet (1 mg total) by mouth 3 (three) times daily as needed for anxiety.   clobetasol 0.05 % external solution Commonly known as: TEMOVATE APPLY EXTERNALLY TO THE AFFECTED AREA TWICE A DAY AS NEEDED. What changed: Another medication with the same name was removed. Continue taking this medication, and follow the directions you see here.   dexlansoprazole 60 MG capsule Commonly known as: Dexilant Take 1 capsule (60 mg total) by mouth daily. Start this on 02/25 after protonix is used up Start taking on: April 18, 2020 What changed:  additional instructions These instructions start on April 18, 2020. If you are unsure what to do until then, ask your doctor or other care provider. Another  medication with the same name was removed. Continue taking this medication, and follow the directions you see here.   fexofenadine 60 MG tablet Commonly known as: ALLEGRA Take 1 tablet (60 mg total) by mouth 2 (two) times daily.   fluticasone 50 MCG/ACT nasal spray Commonly known as: FLONASE Place 2 sprays into both nostrils daily. What changed: Another medication with the same name was removed. Continue taking this medication, and follow the directions you see here.   furosemide 20 MG tablet Commonly known as: LASIX Take 0.5 tablets (10 mg total) by  mouth daily. What changed: how much to take   melatonin 3 MG Tabs tablet Take 1 tablet (3 mg total) by mouth at bedtime as needed. Can purchase over the counter Notes to patient: Purchase over the counter   methadone 10 MG tablet--Rx # 2 pills Commonly known as: DOLOPHINE Take 1 tablet (10 mg total) by mouth every 12 (twelve) hours. Notes to patient: Can use as needed   metoprolol tartrate 25 MG tablet Commonly known as: LOPRESSOR Take 0.5 tablets (12.5 mg total) by mouth 2 (two) times daily.   multivitamin with minerals Tabs tablet Take 1 tablet by mouth daily.   pantoprazole 40 MG tablet Commonly known as: PROTONIX Take 1 tablet (40 mg total) by mouth 2 (two) times daily before a meal.   saline Gel Place 1 application into both nostrils every 6 (six) hours.   thiamine 100 MG tablet Take 1 tablet (100 mg total) by mouth daily.   traZODone 100 MG tablet Commonly known as: DESYREL Take 1 tablet (100 mg total) by mouth at bedtime as needed for sleep.        Follow-up Information     Jamse Arn, MD Follow up.   Specialty: Physical Medicine and Rehabilitation Why: as needed Contact information: [address] [place] [address] [phone]         Kathyrn Drown, MD. Call in 1 day(s).   Specialty: Family Medicine Why: for post hospital follow up Contact information: [address] [place] [address] [phone]         Bowen, Christ Kick, MD Follow up.   Specialty: Emergency Medicine Why: [address]. Appointment at 8 am. Need to take your Medicaid card, medications and photo ID.  Contact information: [address] [place] [address] [phone]                 Signed: Bary Leriche 03/19/2020, 11:52 AM Patient was seen, face-face, and physical exam performed by me on day of discharge, greater than 30 minutes of total time spent.. Please see progress note from day of discharge as well.  Delice Lesch, MD, ABPMR

## 2020-03-18 NOTE — Progress Notes (Addendum)
Occupational Therapy Discharge Summary  Patient Details  Name: Belinda [name] MRN: [id number] Date of Birth: 08-04-1980  Today's Date: 03/18/2020      Patient has met 11 of 12 long term goals due to improved activity tolerance, improved balance and ability to compensate for deficits.  Patient to discharge at overall Independent level.  Patient's care partner is independent to provide the necessary physical assistance at discharge.    Reasons goals not met: Pt requires close supervision for tub transfer due to need to step in/out of tub with slight unsteadiness.   Recommendation:  Patient will not require further skilled OT at discharge.  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 Monitor HR and O2 Restrictions Weight Bearing Restrictions: No Pain Pain Assessment Pain Scale: 0-10 Pain Score: 0-No pain Pain Location: Throat Pain Descriptors / Indicators: Discomfort Pain Intervention(s): Medication (See eMAR) ADL ADL Grooming: Modified independent Where Assessed-Grooming: Standing at sink Upper Body Bathing: Modified independent Where Assessed-Upper Body Bathing: Sitting at sink Lower Body Bathing: Modified independent Where Assessed-Lower Body Bathing: Standing at sink,Sitting at sink Upper Body Dressing: Independent Where Assessed-Upper Body Dressing: Sitting at sink Lower Body Dressing: Modified independent Where Assessed-Lower Body Dressing: Sitting at sink,Standing at sink Toileting: Independent Where Assessed-Toileting: Glass blower/designer: Programmer, applications Method: Counselling psychologist: Radiographer, therapeutic: Close supervison Clinical cytogeneticist Method: Engineer, technical sales: Civil engineer, contracting with back Vision Baseline Vision/History: Wears glasses Wears Glasses: Reading only Patient Visual Report: No change from baseline Vision Assessment?: No apparent visual deficits Perception  Perception: Within Functional Limits Praxis Praxis: Intact Cognition Overall Cognitive Status: Within Functional Limits for tasks assessed Arousal/Alertness: Awake/alert Orientation Level: Oriented X4 Memory: Appears intact Safety/Judgment: Appears intact Sensation Sensation Light Touch: Appears Intact Hot/Cold: Appears Intact Proprioception: Appears Intact Stereognosis: Not tested Coordination Gross Motor Movements are Fluid and Coordinated: Yes Fine Motor Movements are Fluid and Coordinated: Yes Motor  Motor Motor: Within Functional Limits Mobility  Transfers Sit to Stand: Independent Stand to Sit: Independent  Trunk/Postural Assessment  Cervical Assessment Cervical Assessment: Within Functional Limits Thoracic Assessment Thoracic Assessment: Within Functional Limits Lumbar Assessment Lumbar Assessment: Within Functional Limits Postural Control Postural Control: Within Functional Limits  Balance Balance Balance Assessed: Yes Static Sitting Balance Static Sitting - Level of Assistance: 7: Independent Dynamic Sitting Balance Dynamic Sitting - Level of Assistance: 7: Independent Static Standing Balance Static Standing - Level of Assistance: 7: Independent Dynamic Standing Balance Dynamic Standing - Level of Assistance: 7: Independent Extremity/Trunk Assessment RUE Assessment RUE Assessment: Within Functional Limits LUE Assessment LUE Assessment: Within Functional Limits   [name] [name] 03/18/2020, 9:45 AM

## 2020-03-19 ENCOUNTER — Telehealth: Payer: Self-pay | Admitting: *Deleted

## 2020-03-19 ENCOUNTER — Telehealth: Payer: Self-pay

## 2020-03-19 ENCOUNTER — Telehealth: Payer: Self-pay | Admitting: Physical Medicine and Rehabilitation

## 2020-03-19 ENCOUNTER — Other Ambulatory Visit: Payer: Self-pay | Admitting: Family Medicine

## 2020-03-19 ENCOUNTER — Encounter: Payer: Self-pay | Admitting: Physical Medicine and Rehabilitation

## 2020-03-19 DIAGNOSIS — R7989 Other specified abnormal findings of blood chemistry: Secondary | ICD-10-CM

## 2020-03-19 MED ORDER — METHADONE HCL 10 MG PO TABS: 10.0000 mg | ORAL_TABLET | Freq: Two times a day (BID) | ORAL | 0 refills | Status: DC

## 2020-03-19 NOTE — Telephone Encounter (Signed)
Pt was notified.  

## 2020-03-19 NOTE — Telephone Encounter (Signed)
Medication was sent please see the other prescription if any questions please notify me

## 2020-03-19 NOTE — Telephone Encounter (Signed)
Patient called stating she was discharged from the hospital yesterday and given #2 tablets of Methadone. Patient was sent to a suboxone clinic and was told they couldn't help her. She has made an appointment with [facility] for methadone for tomorrow morning at 6am but is concerned about missing her dose of methadone tonight. Requesting 1 tablet to get her to her appointment.

## 2020-03-19 NOTE — Telephone Encounter (Signed)
Spoke to intake person at Suboxone clinic to confirm patient's message. They do will not Rx suboxone till patient is weaned off methadone and have referred her to local methadone clinic for wean. They also expressed concerns about her use of xanax. I also talked with patient regarding weaning of methadone to daily as she is now on a small dose. Patient expressed anxiety and fear--she refused this plan and would prefer for the clinic to help her wean "slowly" as was told that she "would have to go to the hospital if she did not get her 2 doses of methadone for today". Discussed situation with Dr. Posey Pronto and reached out to Danella Sensing NP to Rx Methadone 10 mg #2 pills one every 12 hours prn pain.

## 2020-03-19 NOTE — Telephone Encounter (Signed)
Discussed with pt. Pt verbalized understanding.

## 2020-03-19 NOTE — Telephone Encounter (Signed)
Belinda [name] called and said the hospital  had her on Methadone in the hospital she asked to be referred to a methadone clinic to get off and they send her to a Suboxone clinic she was told her they could not help her. They have got her in touch with Belinda [name] they can see her at 6 am in the morning She is out of the methadone hospital wrote for her two she is out for tonight and in the morning she was in the hospital for 42 days on this meds and unable to go cold [place]. Pt is worried of going into seizure without having the meds.   [phone] pt

## 2020-03-19 NOTE — Telephone Encounter (Signed)
Methadone 10 mg 2 tablets Was sent to [place] She can do 1 this evening and 12 hours later 1 in the morning after that these prescriptions will be in the hands of her pain management/detox center that we will taper her off

## 2020-03-20 ENCOUNTER — Other Ambulatory Visit: Payer: Self-pay | Admitting: Family Medicine

## 2020-03-20 ENCOUNTER — Telehealth: Payer: Self-pay | Admitting: Family Medicine

## 2020-03-20 MED ORDER — METHADONE HCL 10 MG PO TABS
10.0000 mg | ORAL_TABLET | Freq: Two times a day (BID) | ORAL | 0 refills | Status: DC
Start: 2020-03-20 — End: 2020-08-05

## 2020-03-20 NOTE — Telephone Encounter (Signed)
Lmtc

## 2020-03-20 NOTE — Telephone Encounter (Signed)
Late note--called patient back about refilling her methadone but she reported that she did not need it as her PCP would be doing that for her.

## 2020-03-20 NOTE — Telephone Encounter (Signed)
1 additional pill was sent in

## 2020-03-20 NOTE — Telephone Encounter (Signed)
Patient went to clinic this morning but she needs one more pill of methadone for tonight and the clinic will start prescribing her medication from then on. Belinda [name]- Director of [phone] if you have any question concerning this. [facility]

## 2020-03-31 DIAGNOSIS — Z20828 Contact with and (suspected) exposure to other viral communicable diseases: Secondary | ICD-10-CM | POA: Diagnosis not present

## 2020-04-07 ENCOUNTER — Ambulatory Visit (INDEPENDENT_AMBULATORY_CARE_PROVIDER_SITE_OTHER): Payer: Medicaid Other | Admitting: Family Medicine

## 2020-04-07 ENCOUNTER — Other Ambulatory Visit: Payer: Self-pay

## 2020-04-07 ENCOUNTER — Encounter: Payer: Self-pay | Admitting: Family Medicine

## 2020-04-07 VITALS — BP 112/72 | HR 81 | Temp 97.7°F | Ht 61.0 in | Wt 215.0 lb

## 2020-04-07 DIAGNOSIS — U099 Post covid-19 condition, unspecified: Secondary | ICD-10-CM

## 2020-04-07 DIAGNOSIS — F1110 Opioid abuse, uncomplicated: Secondary | ICD-10-CM

## 2020-04-07 DIAGNOSIS — M21371 Foot drop, right foot: Secondary | ICD-10-CM

## 2020-04-07 DIAGNOSIS — F410 Panic disorder [episodic paroxysmal anxiety] without agoraphobia: Secondary | ICD-10-CM | POA: Diagnosis not present

## 2020-04-07 DIAGNOSIS — D6489 Other specified anemias: Secondary | ICD-10-CM

## 2020-04-07 DIAGNOSIS — F411 Generalized anxiety disorder: Secondary | ICD-10-CM

## 2020-04-07 DIAGNOSIS — R531 Weakness: Secondary | ICD-10-CM | POA: Diagnosis not present

## 2020-04-07 NOTE — Progress Notes (Signed)
Referrals ordered in [facility]

## 2020-04-07 NOTE — Progress Notes (Addendum)
   Subjective:    Patient ID: Belinda [name], female    DOB: 1981-01-14, 40 y.o.   MRN: [id number]  HPI Patient arrives for a follow up on a recent hospitalization for covid for double pneumonia for 42 days.  Pt states was on respirator and problems with R foot and walks with a walker.  The notes from her hospitalization were reviewed Discharge summary reviewed Pituitary adenoma Will need follow-up with MRI Patient with severe anxiety issues.  Recently has been taking the Xanax 3-4 times per day I encourage her that we will want to get it back down to just 3/day I also feel she would benefit from a counselor She does have right foot drop She also has weakness from her ICU stay has to use a walker to get around.  Denies being depressed but is open to seeing counselor Review of Systems See above no fevers recently no wheezing or difficulty breathing    Objective:   Physical Exam Lungs clear respiratory rate normal heart regular pulse normal BP good moderate obesity noted.  Extremities no edema right foot drop noted rest of strength in legs is good       Assessment & Plan:  1. Weakness acquired in ICU Recommend physical therapy.  Recommend regular activity. - Comprehensive metabolic panel  2. Right foot drop Recommend physical therapy for ataxia and right foot drop  3. Panic attacks Xanax may be use 3 times per day currently right now she is using 3 to 4/day so therefore every month we will gradually start backing down the number per month until we reached 3/day She would benefit from counseling.  But the patient defers on any type of psychiatry currently. 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 Anemia due to other cause, not classified Lab work ordered await results - CBC with Differential/Platelet Significant time spent with patient 30 minutes  Patient with opioid misuse she is  currently under the care of opioid clinic they are tapering her on methadone.

## 2020-04-07 NOTE — Patient Instructions (Addendum)
We will set up MRI  We will set up physical therapy  We will set up counselor consult    This is some additional information regarding Covid vaccine.  The Covid vaccine-specifically Pfizer and Moderna-help program the body to help recognize Covid to lessen the chance of getting sick with Covid plus also dramatically lessen the risk of a severe outcome.  These vaccines require 2 shots spaced several weeks apart.  The vaccine utilizes mRNA technology and does not interact with our DNA.  The research for these vaccines started back in the 1990s with mRNA research and technology.  The use of mRNA technology for vaccines was actually developed back during SARS infection of [place] in 2005.  Because this virus did not go worldwide there was never a need to utilize a vaccine.  The vaccine simply instructs our immune system how to recognize Covid virus thus lessening our risk of severe illness.  The studies show that vaccines are generally very well-tolerated.  It is common to have some mild side effects such as soreness at the site of the injection, for some individuals low-grade fever body aches headaches nausea diarrhea not feeling good for 2 to 3 days after the injection.  These moderate side effects are more likely to occur with the second shot.  Within 2 weeks of completing the second shot antibody levels are at a good level. Study showed that since June 2020 90% of those hospitalized for Covid were unvaccinated.  96% of those who died of Covid were unvaccinated.  The risk of dying from Covid for those who are vaccinated and under age 72 approaches 0%.  Most people who get sick with Covid will gradually get better but there is a randomness to the illness in which even younger individuals can have a severe case and end up in the hospital with Covid.  It is true that the older we are, Covid poses more risk.  Unfortunately some of the individuals who have severe cases or die from Covid have very  little underlying health issues and are not considered "elderly ".1 in 7 individuals who have Covid continue to have some symptoms of long-term Covid.  Sometimes this can be altered smell.  There are some this can result with long-term fatigue tiredness or headaches.  Also it has been shown.  Scientific studies that Covid can trigger heart attacks and strokes.  Having a Covid vaccine will not protect one against all Covid infections.  Approximately 20 to 30% of individuals who have had vaccines may get a amount of breakthrough case.  The way I look upon this-most of [place] drive cars that have seatbelts and in some cases airbags.  Every day we drive, we take a risk of potentially being in the traffic accident.  Having a seatbelt and airbag does not mean that we will not get in a traffic accident.  But having a seatbelt and the airbag can dramatically lower our risk of a severe outcome that could put [place] in the hospital or cause [place] to die from a auto accident.  In the same way having the Covid vaccine will lessen our risk of getting sick with Covid but if we are unlucky enough to get Covid the chance of being hospitalized or dying from it is tremendously lower.  So just as I would not feel good about disengaging my seatbelt and disabling my airbag-I would not feel good about living in a pandemic and not having the vaccine.  It is  not unusual for viruses to mutate and therefore it is not unusual to have to sometimes redesign vaccines or get a booster in order to maintain effectiveness of the vaccine.  I have had several individuals who have stated that they are not around others and so therefore they do not need to get the vaccine.  The difficult part is-this virus can infect some without triggering symptoms-therefore we can get exposed by being around a person who looks healthy-yet they are spreading the virus.  Every person is at risk of getting Covid.  I certainly recognize that many patients have difficulty  deciding whether or not to do the vaccine.  Unfortunately there is a lot of information being pushed forward on the news media, social media such as Facebook, and discussion of all friends and family that can be confusing for the average person to figure out.  It is important to realize that this information that we are exposed to falls into 3 categories. Category #1-on target information-this is typically information that is shown by scientific studies, or stated forth by experts within their fields. Category #2-misinformation-this is information that is well intended but is not an accurate reflection of what is really the case. Category #3-dis-information-this is information that is purposefully off base.  It is designed to confuse and discourage an individual from making a proper decision.  Through the past year and a half I have heard many different reasons why people do not get Covid vaccines.  Some of these reasons are a reflection of a person's deeply held beliefs.  Other reasons are based upon misinformation propagated in social media.  When making this decision it is extremely important to weed through all the noise!   Through my years of training, it has helped me to look at public health issues and treatment options from a medical perspective.  It is my firm belief that this vaccine is safe (I personally favor Moderna but Pfizer vaccine is also good).  Please do not underestimate this virus.  It is my sincere wish that all of our patients stay healthy and do not suffer tragic outcomes from this virus.  Please consider getting the vaccine if you have not already done so.  If you should have further questions please let us know.  Thanks-Dr. Nicki Reaper

## 2020-04-08 DIAGNOSIS — F1110 Opioid abuse, uncomplicated: Secondary | ICD-10-CM | POA: Insufficient documentation

## 2020-04-08 MED ORDER — ALPRAZOLAM 1 MG PO TABS: 1.0000 mg | ORAL_TABLET | Freq: Four times a day (QID) | ORAL | 0 refills | Status: DC | PRN

## 2020-04-11 ENCOUNTER — Telehealth: Payer: Self-pay | Admitting: Family Medicine

## 2020-04-11 NOTE — Telephone Encounter (Signed)
Patient states not needing counseling now. She already has one that she likes and sees everyday. Please cancel referral for counseling.

## 2020-04-11 NOTE — Telephone Encounter (Signed)
So noted We will continue to gradually taper down the number of Xanax The goal is to get down to 3/day  (Should be noted that she was on 4/day for ages she is currently under the care of treatment center for methadone and they do counseling through them regarding drug use.)

## 2020-04-14 ENCOUNTER — Telehealth: Payer: Self-pay

## 2020-04-14 NOTE — Telephone Encounter (Signed)
Patient advised per Dr Nicki Reaper:   Now given how these type of medications are governed by medical boards pharmacy agencies etc. she will not be able to get the medicines early.  They will have to be every 30 days.  Patient advised that she like to et them early because that gives her an extra 3-6 pills a months to have on hand to take if she needs them. Advised patient that she should take the medication only as prescribed and the medication can only be filled every 30 days. Prescription sen electronically to pharmacy.  Patient also wants to know if she needs to keep taking B1 vit separately- if so needs script or can she just take a Flintstones vitamin

## 2020-04-14 NOTE — Telephone Encounter (Signed)
Pt is calling back wanting to know if Dr Nicki Reaper write RX every 29 days she is going to be gone on 23rd. She said that 30 days is hard for to go with out meds on her ALPRAZolam (XANAX) 1 MG tablet  Pt also never call a RX sent on her  metoprolol tartrate (LOPRESSOR) 25 MG tablet  She also said there was not a rx sent on Vitamin B-1 or do you want her to take a multi vitamin   Send Rx to [facility] in [place]   Call back [phone]

## 2020-04-14 NOTE — Telephone Encounter (Signed)
Pt is also wanting to know if she can take her Flintstone Vitamin  as well as skin, hair and nail Vitamin

## 2020-04-14 NOTE — Telephone Encounter (Signed)
She does not need to take vitamin B1 separately currently.  It would be fine for her to do a chewable vitamin 1 a day.

## 2020-04-14 NOTE — Telephone Encounter (Signed)
So this is been an issue with this patient for a long time.  Now given how these type of medications are governed by medical boards pharmacy agencies etc. she will not be able to get the medicines early.  They will have to be every 30 days.  She should have enough to last her 30 days.  If she does not she needs to come clean on this issue.  Please find out where this stands currently.  As for the metoprolol she may have a refill of this for 3 months

## 2020-04-14 NOTE — Telephone Encounter (Signed)
Patient advised per Dr Nicki Reaper: She does not need to take vitamin B1 separately currently.  It would be fine for her to do a chewable vitamin 1 a day. Patient verbalized understanding.

## 2020-04-16 ENCOUNTER — Telehealth: Payer: Self-pay | Admitting: Family Medicine

## 2020-04-16 MED ORDER — METOPROLOL TARTRATE 25 MG PO TABS: 12.5000 mg | ORAL_TABLET | Freq: Two times a day (BID) | ORAL | 2 refills | Status: DC

## 2020-04-16 NOTE — Telephone Encounter (Signed)
Prescription sent electronically to pharmacy. Patient notified. 

## 2020-04-16 NOTE — Telephone Encounter (Signed)
Patient is checking on her metoprolol 25 mg suppose to be called into [facility]

## 2020-04-30 DIAGNOSIS — Z20828 Contact with and (suspected) exposure to other viral communicable diseases: Secondary | ICD-10-CM | POA: Diagnosis not present

## 2020-05-03 DIAGNOSIS — R457 State of emotional shock and stress, unspecified: Secondary | ICD-10-CM | POA: Diagnosis not present

## 2020-05-03 DIAGNOSIS — R0789 Other chest pain: Secondary | ICD-10-CM | POA: Diagnosis not present

## 2020-05-03 DIAGNOSIS — R079 Chest pain, unspecified: Secondary | ICD-10-CM | POA: Diagnosis not present

## 2020-05-06 ENCOUNTER — Other Ambulatory Visit: Payer: Self-pay

## 2020-05-06 ENCOUNTER — Emergency Department (HOSPITAL_COMMUNITY): Payer: Medicaid Other

## 2020-05-06 ENCOUNTER — Encounter (HOSPITAL_COMMUNITY): Payer: Self-pay | Admitting: Emergency Medicine

## 2020-05-06 ENCOUNTER — Other Ambulatory Visit (HOSPITAL_COMMUNITY)
Admission: RE | Admit: 2020-05-06 | Discharge: 2020-05-06 | Disposition: A | Discharge status: Home / Self Care | Payer: Medicaid Other | Attending: Family Medicine | Admitting: Family Medicine

## 2020-05-06 ENCOUNTER — Ambulatory Visit (INDEPENDENT_AMBULATORY_CARE_PROVIDER_SITE_OTHER): Payer: Medicaid Other | Admitting: Family Medicine

## 2020-05-06 ENCOUNTER — Emergency Department (HOSPITAL_COMMUNITY)
Admission: EM | Admit: 2020-05-06 | Discharge: 2020-05-07 | Disposition: A | Discharge status: Home / Self Care | Payer: Medicaid Other | Attending: Emergency Medicine | Admitting: Emergency Medicine

## 2020-05-06 ENCOUNTER — Encounter: Payer: Self-pay | Admitting: Family Medicine

## 2020-05-06 ENCOUNTER — Telehealth: Payer: Self-pay | Admitting: Family Medicine

## 2020-05-06 ENCOUNTER — Ambulatory Visit (HOSPITAL_COMMUNITY)
Admission: RE | Admit: 2020-05-06 | Discharge: 2020-05-06 | Disposition: A | Discharge status: Home / Self Care | Payer: Medicaid Other | Source: Ambulatory Visit | Attending: Family Medicine | Admitting: Family Medicine

## 2020-05-06 VITALS — HR 109 | Temp 97.8°F | Ht 61.0 in | Wt 217.0 lb

## 2020-05-06 DIAGNOSIS — J189 Pneumonia, unspecified organism: Secondary | ICD-10-CM

## 2020-05-06 DIAGNOSIS — J181 Lobar pneumonia, unspecified organism: Secondary | ICD-10-CM | POA: Diagnosis not present

## 2020-05-06 DIAGNOSIS — R0600 Dyspnea, unspecified: Secondary | ICD-10-CM | POA: Insufficient documentation

## 2020-05-06 DIAGNOSIS — Z8616 Personal history of COVID-19: Secondary | ICD-10-CM | POA: Insufficient documentation

## 2020-05-06 DIAGNOSIS — J029 Acute pharyngitis, unspecified: Secondary | ICD-10-CM | POA: Insufficient documentation

## 2020-05-06 DIAGNOSIS — J45909 Unspecified asthma, uncomplicated: Secondary | ICD-10-CM | POA: Insufficient documentation

## 2020-05-06 DIAGNOSIS — Z87891 Personal history of nicotine dependence: Secondary | ICD-10-CM | POA: Insufficient documentation

## 2020-05-06 DIAGNOSIS — R042 Hemoptysis: Secondary | ICD-10-CM | POA: Insufficient documentation

## 2020-05-06 DIAGNOSIS — U099 Post covid-19 condition, unspecified: Secondary | ICD-10-CM | POA: Insufficient documentation

## 2020-05-06 DIAGNOSIS — R0609 Other forms of dyspnea: Secondary | ICD-10-CM

## 2020-05-06 DIAGNOSIS — R0602 Shortness of breath: Secondary | ICD-10-CM | POA: Diagnosis not present

## 2020-05-06 DIAGNOSIS — R0781 Pleurodynia: Secondary | ICD-10-CM

## 2020-05-06 DIAGNOSIS — U071 COVID-19: Secondary | ICD-10-CM | POA: Diagnosis not present

## 2020-05-06 DIAGNOSIS — R079 Chest pain, unspecified: Secondary | ICD-10-CM | POA: Diagnosis not present

## 2020-05-06 LAB — COMPREHENSIVE METABOLIC PANEL
ALT: 15 U/L (ref 0–44)
AST: 13 U/L — ABNORMAL LOW (ref 15–41)
Albumin: 3.2 g/dL — ABNORMAL LOW (ref 3.5–5.0)
Alkaline Phosphatase: 55 U/L (ref 38–126)
Anion gap: 11 (ref 5–15)
BUN: 26 mg/dL — ABNORMAL HIGH (ref 6–20)
CO2: 22 mmol/L (ref 22–32)
Calcium: 8.3 mg/dL — ABNORMAL LOW (ref 8.9–10.3)
Chloride: 107 mmol/L (ref 98–111)
Creatinine, Ser: 0.72 mg/dL (ref 0.44–1.00)
GFR, Estimated: >60 mL/min (ref >60)
Glucose, Bld: 109 mg/dL — ABNORMAL HIGH (ref 70–99)
Potassium: 3.2 mmol/L — ABNORMAL LOW (ref 3.5–5.1)
Sodium: 140 mmol/L (ref 135–145)
Total Bilirubin: 0.3 mg/dL (ref 0.3–1.2)
Total Protein: 7.2 g/dL (ref 6.5–8.1)

## 2020-05-06 LAB — CBC WITH DIFFERENTIAL/PLATELET
Abs Immature Granulocytes: 0.11 10*3/uL — ABNORMAL HIGH (ref 0.00–0.07)
Basophils Absolute: 0 10*3/uL (ref 0.0–0.1)
Basophils Relative: 1 %
Eosinophils Absolute: 0.2 10*3/uL (ref 0.0–0.5)
Eosinophils Relative: 2 %
HCT: 31.7 % — ABNORMAL LOW (ref 36.0–46.0)
Hemoglobin: 10.3 g/dL — ABNORMAL LOW (ref 12.0–15.0)
Immature Granulocytes: 1 %
Lymphocytes Relative: 45 %
Lymphs Abs: 3.8 10*3/uL (ref 0.7–4.0)
MCH: 29.4 pg (ref 26.0–34.0)
MCHC: 32.5 g/dL (ref 30.0–36.0)
MCV: 90.6 fL (ref 80.0–100.0)
Monocytes Absolute: 0.7 10*3/uL (ref 0.1–1.0)
Monocytes Relative: 9 %
Neutro Abs: 3.4 10*3/uL (ref 1.7–7.7)
Neutrophils Relative %: 42 %
Platelets: 229 10*3/uL (ref 150–400)
RBC: 3.5 MIL/uL — ABNORMAL LOW (ref 3.87–5.11)
RDW: 13.8 % (ref 11.5–15.5)
WBC: 8.3 10*3/uL (ref 4.0–10.5)
nRBC: 0 % (ref 0.0–0.2)

## 2020-05-06 LAB — PROTIME-INR
INR: 1 (ref 0.8–1.2)
Prothrombin Time: 12.7 seconds (ref 11.4–15.2)

## 2020-05-06 LAB — POCT RAPID STREP A (OFFICE): Rapid Strep A Screen: NEGATIVE

## 2020-05-06 LAB — D-DIMER, QUANTITATIVE: D-Dimer, Quant: 1.94 ug/mL-FEU — ABNORMAL HIGH (ref 0.00–0.50)

## 2020-05-06 LAB — TROPONIN I (HIGH SENSITIVITY)
Troponin I (High Sensitivity): 2 ng/L (ref <18)
Troponin I (High Sensitivity): <2 ng/L (ref <18)

## 2020-05-06 MED ORDER — ALPRAZOLAM 0.5 MG PO TABS
1.0000 mg | ORAL_TABLET | Freq: Once | ORAL | Status: AC
  Administered 2020-05-06: 1 mg via ORAL
  Filled 2020-05-06: qty 2

## 2020-05-06 MED ORDER — AZITHROMYCIN 250 MG PO TABS
500.0000 mg | ORAL_TABLET | Freq: Once | ORAL | Status: AC
  Administered 2020-05-06: 500 mg via ORAL
  Filled 2020-05-06: qty 2

## 2020-05-06 MED ORDER — AMOXICILLIN-POT CLAVULANATE 875-125 MG PO TABS: 1.0000 | ORAL_TABLET | Freq: Two times a day (BID) | ORAL | 0 refills | Status: AC

## 2020-05-06 MED ORDER — AZITHROMYCIN 250 MG PO TABS: 250.0000 mg | ORAL_TABLET | Freq: Every day | ORAL | 0 refills | Status: DC

## 2020-05-06 MED ORDER — IOHEXOL 350 MG/ML SOLN
100.0000 mL | Freq: Once | INTRAVENOUS | Status: AC | PRN
  Administered 2020-05-06: 100 mL via INTRAVENOUS

## 2020-05-06 MED ORDER — AMOXICILLIN-POT CLAVULANATE 875-125 MG PO TABS
1.0000 | ORAL_TABLET | Freq: Once | ORAL | Status: AC
  Administered 2020-05-06: 1 via ORAL
  Filled 2020-05-06: qty 1

## 2020-05-06 NOTE — Telephone Encounter (Signed)
Patient has history of severe Covid infection late 2021.  Seen today by nurse practitioner having some hemoptysis and shortness of breath.  Shortness of breath has been going on since Covid.  Patient has a history of elevated D-dimer.  But D-dimer still positive today and hemoptysis therefore needs to go to ER for further evaluation.  Family was spoken to by nurse practitioner and agreed to go to ER.  ER spoke with regarding patient coming and reason why.  More than likely will need to have CT angio to rule out pulmonary embolism.

## 2020-05-06 NOTE — ED Triage Notes (Signed)
Pt sent from pcp for possible PE. Pt has increased D-dimer. Pt states that she has been sick since last Tuesday and started coughing up blood Sunday.

## 2020-05-06 NOTE — ED Provider Notes (Signed)
Emergency Department Provider Note   I have reviewed the triage vital signs and the nursing notes.   HISTORY  Chief Complaint Hemoptysis   HPI Belinda [name] is a 40 y.o. female with complicated past medical history including COVID in December which presented as altered mental status.  Patient was ultimately intubated and then converted to a trach.  She left the hospital in late January and has been recovering at home.  She developed nasal congestion with cough approximately 1 week ago but then shortly after developed pain in the left lower chest.  She states that feels sharp and worse with deep breathing to the point of cutting off her breathing.  She is not feeling severely short of breath but has had some subjective fevers.  She notes that on Saturday she actually called for EMS with worsening symptoms but was not ultimately transported. She developed hemoptysis the next day which has been persistent with the CP.  Patient tells me she is coughing up maroon-colored blood which she is between the size of a quarter and a dime.  She is not vomiting blood.  She denies any diarrhea. No other modifying factors.   Patient went to her primary care doctor early today who performed a chest x-ray and blood work which showed an elevated D-dimer. She was sent to the ED for evaluation.   Past Medical History:  Diagnosis Date  . Anxiety    compulsive worring, phobia  . Anxiety   . Anxiety disorder   . Asthma   . Bipolar affective ([place])   . Fatty liver 11/14/2018  . Fatty liver   . GAD (generalized anxiety disorder)    with panic attacks  . GERD (gastroesophageal reflux disease)   . Morbid obesity ([place])   . Narcotic addiction ([place])   . Narcotic drug use   . OCD (obsessive compulsive disorder)   . Psoriasis   . Reactive airways dysfunction syndrome ([place])   . Vaginal Pap smear, abnormal     Patient Active Problem List   Diagnosis Date Noted  . Post covid-19 condition, unspecified  05/06/2020  . Sore throat 05/06/2020  . Hemoptysis 05/06/2020  . Dyspnea on exertion 05/06/2020  . Opioid abuse ([place]) 04/08/2020  . Elevated prolactin level 03/19/2020  . Prediabetes   . Urinary frequency   . Chronic pain syndrome   . Substance abuse ([place])   . Acute blood loss anemia   . 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 Debility 03/05/2020  . Status post tracheostomy ([place])   . Critical illness myopathy   . Dysphagia   . Acute respiratory failure ([place])   . Sepsis ([place]) 02/07/2020  . Metabolic encephalopathy 41/93/7902  . Hypokalemia 02/07/2020  . Hypoalbuminemia 02/07/2020  . Elevated liver enzymes 02/07/2020  . COVID-19 02/07/2020  . Pneumonia due to COVID-19 virus 02/06/2020  . Bloating 11/07/2019  . Fatty liver 11/14/2018  . Abdominal pain, epigastric 06/29/2018  . Constipation 06/29/2018  . Elevated TSH 04/24/2018  . Weight loss counseling, encounter for 04/24/2018  . Body mass index 40.0-44.9, adult ([place]) 04/24/2018  . Encounter for gynecological examination with Papanicolaou smear of cervix 03/16/2018  . Encounter for initial prescription of contraceptive pills 03/16/2018  . Smoker 03/16/2018  . History of abnormal cervical Pap smear 03/16/2018  . Screening examination for STD (sexually transmitted disease) 03/16/2018  . Weight gain 03/16/2018  . OCD (obsessive compulsive disorder) 09/01/2015  . GERD (gastroesophageal reflux disease) 03/24/2015  . Morbid obesity ([place])  03/24/2015  . Hyperglycemia 03/24/2015  . Hypertriglyceridemia 03/24/2015  . Obesity 01/07/2014  . 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 Generalized anxiety disorder 05/17/2012  . Reactive airway disease 05/12/2012  . Psoriasis 05/12/2012    Past Surgical History:  Procedure Laterality Date  . CESAREAN SECTION     twice  . COLPOSCOPY    . DILATION AND CURETTAGE OF UTERUS    . ESOPHAGOGASTRODUODENOSCOPY  07/2018  . ESOPHAGOGASTRODUODENOSCOPY (EGD) WITH PROPOFOL N/A 08/03/2018    Procedure: ESOPHAGOGASTRODUODENOSCOPY (EGD) WITH PROPOFOL;  Surgeon: Daneil Dolin, MD;  Location: [facility];  Service: Endoscopy;  Laterality: N/A;  11:00am  . PID      Allergies Buspar [buspirone], Celexa [citalopram hydrobromide], Celexa [citalopram], Doxycycline, Wellbutrin [bupropion], Wellbutrin [bupropion], Zoloft [sertraline hcl], Zoloft [sertraline], Augmentin [amoxicillin-pot clavulanate], and Doxycycline  Family History  Problem Relation Age of Onset  . High blood pressure Father   . Stroke Father   . High blood pressure Brother   . Hypertension Father   . Heart attack Father   . Heart disease Maternal Grandmother   . Hypertension Brother   . Breast cancer Maternal Aunt   . Colon cancer Neg Hx     Social History Social History   Tobacco Use  . Smoking status: Former Smoker    Types: Cigarettes  . Smokeless tobacco: Never Used  . Tobacco comment: light smoker 5 cigs daily  Vaping Use  . Vaping Use: Never used  Substance Use Topics  . Alcohol use: No  . Drug use: No    Comment: Denies    Review of Systems  Constitutional: Positive subjective fever/chills Eyes: No visual changes. ENT: No sore throat. Cardiovascular: Positive chest pain with hemoptysis. Respiratory: Occasional shortness of breath. Gastrointestinal: No abdominal pain.  No nausea, no vomiting.  No diarrhea.  No constipation. Genitourinary: Negative for dysuria. Started menstrual cycle today.  Musculoskeletal: Negative for back pain. Skin: Negative for rash. Neurological: Negative for headaches, focal weakness or numbness.  10-point ROS otherwise negative.  ____________________________________________   PHYSICAL EXAM:  VITAL SIGNS: ED Triage Vitals  Enc Vitals Group     BP 05/06/20 2043 115/76     Pulse Rate 05/06/20 2043 89     Resp 05/06/20 2043 19     Temp 05/06/20 2043 98.1 F (36.7 C)     Temp Source 05/06/20 2043 Oral     SpO2 05/06/20 2043 100 %     Weight 05/06/20  2037 216 lb 14.9 oz (98.4 kg)     Height 05/06/20 2037 5' 1"  (1.549 m)   Constitutional: Alert and oriented. Well appearing and in no acute distress. Eyes: Conjunctivae are normal. Head: Atraumatic. Nose: No congestion/rhinnorhea. Mouth/Throat: Mucous membranes are moist.  Oropharynx non-erythematous. Neck: No stridor. Well appearing trach scan to anterior neck.  Cardiovascular: Normal rate, regular rhythm. Good peripheral circulation. Grossly normal heart sounds.   Respiratory: Normal respiratory effort.  No retractions. Lungs CTAB. Gastrointestinal: Soft and nontender. No distention.  Musculoskeletal: No lower extremity tenderness nor edema. No gross deformities of extremities. Neurologic:  Normal speech and language. Right leg foot drop noted.  Skin:  Skin is warm, dry and intact. No rash noted.   ____________________________________________   LABS (all labs ordered are listed, but only abnormal results are displayed)  Labs Reviewed  COMPREHENSIVE METABOLIC PANEL - Abnormal; Notable for the following components:      Result Value   Potassium 3.2 (*)    Glucose, Bld 109 (*)    BUN 26 (*)  Calcium 8.3 (*)    Albumin 3.2 (*)    AST 13 (*)    All other components within normal limits  CBC WITH DIFFERENTIAL/PLATELET - Abnormal; Notable for the following components:   RBC 3.50 (*)    Hemoglobin 10.3 (*)    HCT 31.7 (*)    Abs Immature Granulocytes 0.11 (*)    All other components within normal limits  PROTIME-INR  TROPONIN I (HIGH SENSITIVITY)  TROPONIN I (HIGH SENSITIVITY)   ____________________________________________  EKG   EKG Interpretation  Date/Time:  Tuesday May 06 2020 20:41:57 EDT Ventricular Rate:  82 PR Interval:    QRS Duration: 98 QT Interval:  405 QTC Calculation: 473 R Axis:   42 Text Interpretation: Sinus rhythm No old tracing for comparison Confirmed by Nanda Quinton [phone]) on 05/06/2020 9:24:06 PM        ____________________________________________  RADIOLOGY  DG Chest 2 View  Result Date: 05/06/2020 CLINICAL DATA:  Hemoptysis. Shortness of breath with exertion. Left-sided chest pain. COVID positive 02/06/2020 EXAM: CHEST - 2 VIEW COMPARISON:  Most recent radiograph 02/20/2020 FINDINGS: Removal of prior support apparatus. Improved lung aeration from prior exam. Mild prominent interstitial markings diffusely may represent scarring. There is streaky subsegmental atelectasis or scarring in the left lung base and lingula. No pleural fluid or pneumothorax. No evidence of pneumomediastinum. No pneumothorax. No acute osseous abnormalities are seen. IMPRESSION: 1. Improved lung aeration from prior exam with residual streaky subsegmental atelectasis or scarring in the left lung base and lingula. 2. Mild diffuse interstitial coarsening which may be related to scarring/COVID sequela. Electronically Signed   By: Keith Rake M.D.   On: 05/06/2020 17:45   CT Angio Chest PE W and/or Wo Contrast  Result Date: 05/06/2020 CLINICAL DATA:  PE suspected, high prob Cough.  Recent COVID.  Elevated D-dimer. EXAM: CT ANGIOGRAPHY CHEST WITH CONTRAST TECHNIQUE: Multidetector CT imaging of the chest was performed using the standard protocol during bolus administration of intravenous contrast. Multiplanar CT image reconstructions and MIPs were obtained to evaluate the vascular anatomy. CONTRAST:  164m OMNIPAQUE IOHEXOL 350 MG/ML SOLN COMPARISON:  Radiographs earlier today. FINDINGS: Cardiovascular: There are no filling defects within the pulmonary arteries to suggest pulmonary embolus. Mild cardiomegaly. Trace pericardial effusion. No aortic dissection. Common origin of the brachiocephalic and left common carotid artery, variant arch anatomy. Mediastinum/Nodes: No enlarged mediastinal or hilar lymph nodes. No esophageal wall thickening. No visualized thyroid nodule. Lungs/Pleura: Dense consolidation involving the  anteromedial left lower lobe with air bronchograms consistent with pneumonia. Subsegmental atelectasis in the lingula. Bandlike atelectasis or scarring in the right lower and middle lobes. Mild elevation of right hemidiaphragm. Upper Abdomen: Prominent liver and spleen, partially included. No acute findings. Musculoskeletal: There are no acute or suspicious osseous abnormalities. Review of the MIP images confirms the above findings. IMPRESSION: 1. No pulmonary embolus. 2. Dense consolidation in the anteromedial left lower lobe with air bronchograms consistent with pneumonia. 3. Streaky and bandlike opacities in the lingula, right lower and right middle lobes, favor post infectious/inflammatory scarring. 4. Mild cardiomegaly. Trace pericardial effusion. Electronically Signed   By: MKeith RakeM.D.   On: 05/06/2020 23:12   DG Chest Portable 1 View  Result Date: 05/06/2020 CLINICAL DATA:  Hemoptysis. EXAM: PORTABLE CHEST 1 VIEW COMPARISON:  May 06, 2020 (5:10 p.m.) FINDINGS: Mild, diffusely increased lung markings are seen. Mild, stable atelectasis and/or infiltrate is seen within the left lung base. Very mild right basilar linear atelectasis is noted. There is no evidence of  a pleural effusion or pneumothorax. The heart size and mediastinal contours are within normal limits. The visualized skeletal structures are unremarkable. IMPRESSION: 1. Predominant stable exam with mild, stable left basilar scarring and/or atelectasis. A mild superimposed infectious component cannot be excluded. Electronically Signed   By: Virgina Norfolk M.D.   On: 05/06/2020 21:32    ____________________________________________   PROCEDURES  Procedure(s) performed:   Procedures  None  ____________________________________________   INITIAL IMPRESSION / ASSESSMENT AND PLAN / ED COURSE  Pertinent labs & imaging results that were available during my care of the patient were reviewed by me and considered in my medical  decision making (see chart for details).   Patient presents to the emergency department with hemoptysis, sharp/pleuritic chest pain, with multiple risk factors for PE including prolonged hospitalization after Covid and new mobility limitation with right foot drop requiring a walker.  Patient's D-dimer and labs from today were reviewed and will repeat blood work and obtain CTA PE study.   11:30 PM  Labs reviewed showing normal kidney function and CTA obtained of the chest.  CBC negative for leukocytosis.  CT shows dense consolidation on the left consistent with pneumonia.  Some areas of scarring but no PE is seen.  Patient has not been on antibiotics in the past 30 days.  She was discharged from the hospital over 1 month ago.  She does have some comorbidities with her history of severe Covid and I am electing to treat her with Augmentin and azithromycin.  She notes an allergy to doxycycline which included a rash and so this was avoided.  She notes that amoxicillin gives her diarrhea but that she does not have a specific allergy to this.  She is not hypoxemic.  She is not having increased work of breathing.  I do not see an indication for management as an inpatient.  She does not have any major or minor criteria for severe pneumonia at this time.   Discussed strict ED return precautions as well as PCP follow-up plan in the next 48 hours. Will give first dose of abx here in the ED prior to d/c. ____________________________________________  FINAL CLINICAL IMPRESSION(S) / ED DIAGNOSES  Final diagnoses:  Community acquired pneumonia of left lower lobe of lung  Hemoptysis  Pleuritic chest pain     MEDICATIONS GIVEN DURING THIS VISIT:  Medications  amoxicillin-clavulanate (AUGMENTIN) 875-125 MG per tablet 1 tablet (has no administration in time range)  azithromycin (ZITHROMAX) tablet 500 mg (has no administration in time range)  iohexol (OMNIPAQUE) 350 MG/ML injection 100 mL (100 mLs Intravenous  Contrast Given 05/06/20 2246)  ALPRAZolam (XANAX) tablet 1 mg (1 mg Oral Given 05/06/20 2315)     NEW OUTPATIENT MEDICATIONS STARTED DURING THIS VISIT:  New Prescriptions   AMOXICILLIN-CLAVULANATE (AUGMENTIN) 875-125 MG TABLET    Take 1 tablet by mouth every 12 (twelve) hours for 7 days.   AZITHROMYCIN (ZITHROMAX) 250 MG TABLET    Take 1 tablet (250 mg total) by mouth daily. Take first 2 tablets together, then 1 every day until finished.    Note:  This document was prepared using Dragon voice recognition software and may include unintentional dictation errors.  Nanda Quinton, MD, [facility] Emergency Medicine    Guiliana Shor, [facility], MD 05/06/20 [phone]

## 2020-05-06 NOTE — Discharge Instructions (Signed)
You were seen in the emerge department today with chest pain and coughing up blood.  Your CT scan did not show blood clots in the lungs but did show a pneumonia on the left side.  This is likely causing your pain as well as your coughing up blood.  I am starting you on 2 antibiotics to take for the next week.  The azithromycin is a 5-day course.  Please take as directed.  If you develop any new trouble breathing, worsening chest pain, vomiting, or confusion you should return to the emergency department.  Please call your primary care doctor tomorrow to schedule a follow-up for the next 1 to 2 days.   Please notify the methadone clinic that you should receive your treatment outside as you have been diagnosed with pneumonia.

## 2020-05-06 NOTE — Progress Notes (Signed)
Patient ID: LEVETTE PAULICK, female    DOB: 1980-09-23, 40 y.o.   MRN: [id number]   Chief Complaint  Patient presents with  . Cough   Subjective:  CC: Coughing up blood  This is a new problem.  Presents today for an acute visit with a complaint of coughing up blood for the last 2 to 3 days.  Also reports that she has pain along the left rib/back area pain is located under the left breast radiates to the back and to the shoulder blade and 2 ports at at the jawline as well.  Does have shortness of breath with exertion.  Patient was hospitalized for 42 days with serious COVID infection.  That was 90 days ago.  Reports that she has had fever, chills, congestion postnasal drip, sore throat, cough, chest pain, shortness of breath, and back pain.  Has tried Afrin, Mucinex, Tylenol, ibuprofen for her symptoms.  She reports that she called EMS on Saturday, they reported her heart rate was elevated but her temperature was normal.  She is present today with her mom who is waiting in the car.   Patient presents today with respiratory illness Number of days present-  Since having covid about 90 days ago. Coughing up blood for past 2 days  Symptoms include- had covid and in hospital for 40 some days. Been out for about 50 some days, hr elevated since having covid. Pt states having pain in left lung and rib, nose stopped up and coughing up blood for past 2 days.   Presence of worrisome signs (severe shortness of breath, lethargy, etc.) - gets short winded when walking around house   Recent/current visit to urgent care or ER- none  Recent direct exposure to Covid- none  Any current Covid testing- none   Medical History Jode has a past medical history of Anxiety, Anxiety, Anxiety disorder, Asthma, Bipolar affective ([place]), Fatty liver (11/14/2018), Fatty liver, GAD (generalized anxiety disorder), GERD (gastroesophageal reflux disease), Morbid obesity ([place]), Narcotic addiction ([place]), Narcotic drug use,  OCD (obsessive compulsive disorder), Psoriasis, Reactive airways dysfunction syndrome ([place]), and Vaginal Pap smear, abnormal.   Outpatient Encounter Medications as of 05/06/2020  Medication Sig  . ALPRAZolam (XANAX) 1 MG tablet Take 1 tablet (1 mg total) by mouth 4 (four) times daily as needed for anxiety.  . ALPRAZolam (XANAX) 1 MG tablet Take 1 tablet (1 mg total) by mouth 4 (four) times daily as needed for anxiety.  . ALPRAZolam (XANAX) 1 MG tablet Take 1 tablet (1 mg total) by mouth 4 (four) times daily as needed for anxiety.  . clobetasol (TEMOVATE) 0.05 % external solution APPLY EXTERNALLY TO THE AFFECTED AREA TWICE A DAY AS NEEDED.  Marland Kitchen dexlansoprazole (DEXILANT) 60 MG capsule Take 1 capsule (60 mg total) by mouth daily. Start this on 02/25 after protonix is used up  . fexofenadine (ALLEGRA) 60 MG tablet Take 1 tablet (60 mg total) by mouth 2 (two) times daily.  . melatonin 3 MG TABS tablet Take 1 tablet (3 mg total) by mouth at bedtime as needed. Can purchase over the counter  . methadone (DOLOPHINE) 10 MG tablet Take 1 tablet (10 mg total) by mouth every 12 (twelve) hours.  . metoprolol tartrate (LOPRESSOR) 25 MG tablet Take 0.5 tablets (12.5 mg total) by mouth 2 (two) times daily.  . Multiple Vitamin (MULTIVITAMIN WITH MINERALS) TABS tablet Take 1 tablet by mouth daily. (Patient not taking: Reported on 04/07/2020)  . pantoprazole (PROTONIX) 40 MG tablet Take 1  tablet (40 mg total) by mouth 2 (two) times daily before a meal. (Patient not taking: Reported on 04/07/2020)  . saline (AYR) GEL Place 1 application into both nostrils every 6 (six) hours.  . thiamine 100 MG tablet Take 1 tablet (100 mg total) by mouth daily.   No facility-administered encounter medications on file as of 05/06/2020.     Review of Systems  Constitutional: Positive for chills and fever.  HENT: Positive for congestion, postnasal drip and sore throat.   Respiratory: Positive for cough and shortness of breath.         Blood in sputum  Cardiovascular: Positive for chest pain.  Musculoskeletal: Positive for back pain.     Vitals Pulse (!) 109   Temp 97.8 F (36.6 C)   Ht 5' 1"  (1.549 m)   Wt 217 lb (98.4 kg)   SpO2 96%   BMI 41.00 kg/m   Objective:   Physical Exam Vitals reviewed.  Constitutional:      Appearance: Normal appearance.  HENT:     Mouth/Throat:     Pharynx: Posterior oropharyngeal erythema present. No oropharyngeal exudate.  Cardiovascular:     Rate and Rhythm: Normal rate and regular rhythm.     Heart sounds: Normal heart sounds.  Pulmonary:     Effort: Pulmonary effort is normal.     Breath sounds: Normal breath sounds.     Comments: Reports coughing bright blood past 2 days.  Musculoskeletal:     Comments: Significant right lower extremity weakness, dragging right foot, ambulating with a walker.  Skin:    General: Skin is warm and dry.  Neurological:     General: No focal deficit present.     Mental Status: She is alert.  Psychiatric:        Behavior: Behavior normal.    Results for orders placed or performed in visit on 05/06/20  POCT rapid strep A  Result Value Ref Range   Rapid Strep A Screen Negative Negative     Assessment and Plan   1. Sore throat - Strep A DNA probe - POCT rapid strep A  2. Post covid-19 condition, unspecified  3. Hemoptysis - D-dimer, quantitative - DG Chest 2 View  4. Dyspnea on exertion - D-dimer, quantitative - DG Chest 2 View   Due to significant history of serious COVID infection and prolonged hospitalization, concerning for coughing up blood, will send for stat D-dimer and stat chest x-ray at any pain now.  Information given to her mother, will have low threshold for sending to the emergency department tonight.  If D-dimer is elevated she is instructed that I will recommend that she go to the emergency department.  Rapid strep negative, will send for culture.  Update: d-dimer elevated: 1.94 (discussed with Dr. Sallee Lange) will recommend going to ED for further evaluation Re:  Hemoptysis and dyspnea on exertion after prolonged hospitalization from Covid infection. Spoke with Mom, Diane, recommend going to ED, she will take to [facility] ED. Spoke with Maudie Mercury and shared the same information recommend going to ED tonight to rule out pulmonary embolus. Dr. Sallee Lange to call ED provider.   Agrees with plan of care discussed today. Understands warning signs to seek further care: chest pain, shortness of breath, any significant change in health.  Understands to follow-up at [facility] emergency department tonight due to hemoptysis and dyspnea on exertion.     Pecolia Ades, NP 05/06/2020

## 2020-05-07 LAB — STREP A DNA PROBE: Strep Gp A Direct, DNA Probe: NEGATIVE

## 2020-05-08 ENCOUNTER — Telehealth: Payer: Self-pay

## 2020-05-08 NOTE — Telephone Encounter (Signed)
Error

## 2020-05-08 NOTE — Telephone Encounter (Signed)
Transition Care Management Unsuccessful Follow-up Telephone Call  Date of discharge and from where:  05/07/2020 from [facility]  Attempts:  1st Attempt  Reason for unsuccessful TCM follow-up call:  Left voice message

## 2020-05-09 NOTE — Telephone Encounter (Signed)
Transition Care Management Unsuccessful Follow-up Telephone Call  Date of discharge and from where:  05/07/2020 from [facility]  Attempts:  2nd Attempt  Reason for unsuccessful TCM follow-up call:  Left voice message

## 2020-05-12 NOTE — Telephone Encounter (Signed)
Transition Care Management Unsuccessful Follow-up Telephone Call  Date of discharge and from where:  05/07/2020 from [facility]  Attempts:  3rd Attempt  Reason for unsuccessful TCM follow-up call:  Left voice message

## 2020-05-13 ENCOUNTER — Other Ambulatory Visit: Payer: Self-pay

## 2020-05-13 ENCOUNTER — Encounter: Payer: Self-pay | Admitting: Family Medicine

## 2020-05-13 ENCOUNTER — Ambulatory Visit (INDEPENDENT_AMBULATORY_CARE_PROVIDER_SITE_OTHER): Payer: Medicaid Other | Admitting: Family Medicine

## 2020-05-13 VITALS — BP 128/80 | HR 62 | Temp 97.5°F | Ht 61.0 in | Wt 216.0 lb

## 2020-05-13 DIAGNOSIS — R9389 Abnormal findings on diagnostic imaging of other specified body structures: Secondary | ICD-10-CM | POA: Diagnosis not present

## 2020-05-13 DIAGNOSIS — M21371 Foot drop, right foot: Secondary | ICD-10-CM | POA: Diagnosis not present

## 2020-05-13 DIAGNOSIS — E8809 Other disorders of plasma-protein metabolism, not elsewhere classified: Secondary | ICD-10-CM | POA: Diagnosis not present

## 2020-05-13 DIAGNOSIS — G9340 Encephalopathy, unspecified: Secondary | ICD-10-CM

## 2020-05-13 DIAGNOSIS — E876 Hypokalemia: Secondary | ICD-10-CM | POA: Diagnosis not present

## 2020-05-13 DIAGNOSIS — D6489 Other specified anemias: Secondary | ICD-10-CM

## 2020-05-13 DIAGNOSIS — F411 Generalized anxiety disorder: Secondary | ICD-10-CM | POA: Diagnosis not present

## 2020-05-13 DIAGNOSIS — G9389 Other specified disorders of brain: Secondary | ICD-10-CM | POA: Diagnosis not present

## 2020-05-13 DIAGNOSIS — J189 Pneumonia, unspecified organism: Secondary | ICD-10-CM

## 2020-05-13 DIAGNOSIS — Z23 Encounter for immunization: Secondary | ICD-10-CM | POA: Diagnosis not present

## 2020-05-13 MED ORDER — ALPRAZOLAM 1 MG PO TABS: 1.0000 mg | ORAL_TABLET | Freq: Four times a day (QID) | ORAL | 0 refills | Status: DC | PRN

## 2020-05-13 NOTE — Progress Notes (Signed)
Referral placed in Epic.

## 2020-05-13 NOTE — Progress Notes (Signed)
   Subjective:    Patient ID: Belinda [name], female    DOB: 01-12-1981, 40 y.o.   MRN: [id number]  HPIhospitalization follow up. Pt states she is doing better. Coughing up some light pink mucus. Finished zpack and has 3 more pills left of augmentin.  Patient recently treated for pneumonia in the ER had hemoptysis scan showed pneumonia not having any other setbacks currently.  Breathing is starting to do a little bit better.  Denies high fever chills or sweats currently CAT scan lab work reviewed with patient   Review of Systems  Constitutional: Negative for activity change, appetite change and fatigue.  HENT: Negative for congestion.   Respiratory: Negative for cough.   Cardiovascular: Negative for chest pain.  Gastrointestinal: Negative for abdominal pain.  Skin: Negative for color change.  Neurological: Negative for headaches.  Psychiatric/Behavioral: Negative for behavioral problems.       Objective:   Physical Exam Vitals reviewed.  Constitutional:      General: She is not in acute distress. HENT:     Head: Normocephalic.  Cardiovascular:     Rate and Rhythm: Normal rate and regular rhythm.     Heart sounds: Normal heart sounds. No murmur heard.   Pulmonary:     Effort: Pulmonary effort is normal.     Breath sounds: Normal breath sounds.  Lymphadenopathy:     Cervical: No cervical adenopathy.  Neurological:     Mental Status: She is alert.  Psychiatric:        Behavior: Behavior normal.           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 Community acquired pneumonia of left lower lobe of lung Actually improving no need for follow-up x-ray  2. Generalized anxiety  disorder See discussion above whittling down the medication gradually the goal is to get down to 95 tablets per every 3 months  3. Right foot drop Physical therapy to evaluate I believe this is related to her severe Covid infection and long hospital stay along with encephalopathy but could be related to a compression of the nerve  We will also refer to neurology for further evaluation more than likely need nerve conduction studies and possible EMG - Ambulatory referral to Physical Therapy  4. Encephalopathy Doing much better.  No need to repeat MRI read just yet but given the finding it pituitary gland I would recommend repeating the MRI she will be seen neurology in the near future more than likely they will do the repeat MRI - CBC with Differential - Comprehensive Metabolic Panel (CMET)  5. Encephalomalacia Please see discussion above patient is actually much better than where she was - CBC with Differential - Comprehensive Metabolic Panel (CMET)  6. Hypokalemia Low potassium while in the hospital recheck this - CBC with Differential - Comprehensive Metabolic Panel (CMET)  7. Hypoalbuminemia Low albumin due to the prolonged Covid hospital stay - CBC with Differential - Comprehensive Metabolic Panel (CMET)  8. Anemia due to other cause, not classified Anemia associated with COVID the hospital stay recheck this - CBC with Differential - Comprehensive Metabolic Panel (CMET)

## 2020-05-13 NOTE — Addendum Note (Signed)
Addended by: Vicente Males on: 05/13/2020 04:40 PM   Modules accepted: Orders

## 2020-05-19 ENCOUNTER — Telehealth: Payer: Self-pay | Admitting: Family Medicine

## 2020-05-19 NOTE — Telephone Encounter (Signed)
I attempted to reach Belinda [name] today to get her scheduled for a phone visit with the Managed Medicaid Team. I left my contact info on her VM. I will reach out again in the next 7-14 days if I have not heard back from her.

## 2020-06-04 ENCOUNTER — Encounter (HOSPITAL_COMMUNITY): Payer: Medicaid Other | Admitting: Occupational Therapy

## 2020-06-10 ENCOUNTER — Telehealth: Payer: Self-pay

## 2020-06-10 MED ORDER — CLONIDINE HCL 0.1 MG PO TABS: ORAL_TABLET | ORAL | 0 refills | Status: DC

## 2020-06-10 NOTE — Telephone Encounter (Signed)
So typically clonidine 0.1 mg 1 twice daily during this transition would be reasonable.  This is a blood pressure medicine which can lessen the risk and side effects of transitioning from 1 opioid to another.  May call in 8 tablets if she is interested.  Potential side effects include dizziness or drowsiness certainly if it is causing too severe of side effects to stop the medication

## 2020-06-10 NOTE — Telephone Encounter (Signed)
[name] she will be switching from Methadone to Subutex on Monday she will need comfort med for Sunday before she starts Subtex on Monday. [facility] [place]. Clinic recommended Hydroxyzine 53m or Clonidine 0.1 mg. KMaudie Mercurydoes not know if she wants her on both or how often she will need to take it.   Clinic contact [address] at 10:30 open at 5 am in the morning   Belinda [name] Call back [phone]

## 2020-06-10 NOTE — Telephone Encounter (Signed)
Pt returned call. Pt in agreement with Clonidine 0.1 mg. Medication sent to [facility]

## 2020-06-10 NOTE — Telephone Encounter (Signed)
Left message to return call 

## 2020-06-13 ENCOUNTER — Other Ambulatory Visit: Payer: Self-pay | Admitting: Family Medicine

## 2020-06-13 ENCOUNTER — Telehealth: Payer: Self-pay

## 2020-06-13 ENCOUNTER — Other Ambulatory Visit: Payer: Self-pay | Admitting: Nurse Practitioner

## 2020-06-13 ENCOUNTER — Telehealth: Payer: Self-pay | Admitting: Family Medicine

## 2020-06-13 MED ORDER — GUAIFENESIN ER 600 MG PO TB12: ORAL_TABLET | ORAL | 0 refills | Status: DC

## 2020-06-13 MED ORDER — FEXOFENADINE-PSEUDOEPHED ER 60-120 MG PO TB12: 1.0000 | ORAL_TABLET | Freq: Two times a day (BID) | ORAL | 0 refills | Status: DC

## 2020-06-13 NOTE — Telephone Encounter (Signed)
Allegra D non preferred for Medicaid- Please advise

## 2020-06-13 NOTE — Telephone Encounter (Signed)
Please advise. Thank you

## 2020-06-13 NOTE — Telephone Encounter (Signed)
I am sorry I do not use Allegra-D for adults because the D Part is a strong decongestant that can drive blood pressure up I am fine with using any allergy medicine that is part of Medicaid I do not go outside of their formulary for allergy medicine thank you

## 2020-06-13 NOTE — Telephone Encounter (Signed)
Zyrtec 40m one daily is covered by her insurance- patient has taken medication in the past- verbal ok to refill given to pharmacist- Allegra d prescription cancelled.

## 2020-06-13 NOTE — Telephone Encounter (Signed)
Patient is requesting a prescription for allegra-D and guaifenesine 600 mg she states takes two a day for her allergies. [place]

## 2020-06-13 NOTE — Telephone Encounter (Signed)
Done

## 2020-06-13 NOTE — Telephone Encounter (Signed)
6 hours

## 2020-06-13 NOTE — Telephone Encounter (Signed)
Maudie Mercury called and [facility] said Medicaid will not cover the meds unless pre approval. Will need pre approval to pharmacy   Pt call back [phone]

## 2020-06-13 NOTE — Telephone Encounter (Signed)
Error

## 2020-06-13 NOTE — Telephone Encounter (Signed)
Pt is wanting to know how far does she need to wait in between the cloNIDine (CATAPRES) 0.1 MG tablet and the metoprolol tartrate (LOPRESSOR) 25 MG tablet or is it safe to take together.  Pt [phone]

## 2020-06-13 NOTE — Telephone Encounter (Signed)
Patient notified and verbalized understanding. 

## 2020-06-13 NOTE — Telephone Encounter (Signed)
Should wait but once she has made the transition to the new opioid suppressive she should stop the clonidine

## 2020-06-16 ENCOUNTER — Ambulatory Visit: Payer: Medicaid Other | Admitting: Family Medicine
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
## 2020-07-11 ENCOUNTER — Telehealth: Payer: Self-pay

## 2020-07-11 NOTE — Telephone Encounter (Signed)
Pt needs refill on ALPRAZolam (XANAX) 1 MG tablet [facility] [place], [place] [address] she is wanting 48   Pt call back [phone]  Also [phone]

## 2020-07-11 NOTE — Telephone Encounter (Signed)
Please advise. Thank you

## 2020-07-12 ENCOUNTER — Other Ambulatory Visit: Payer: Self-pay | Admitting: Family Medicine

## 2020-07-12 MED ORDER — ALPRAZOLAM 1 MG PO TABS
1.0000 mg | ORAL_TABLET | Freq: Four times a day (QID) | ORAL | 0 refills | Status: DC | PRN
Start: 2020-07-12 — End: 2020-07-16

## 2020-07-12 NOTE — Telephone Encounter (Signed)
Prescription was sent in she can get this filled on Monday Patient to keep her appointment on 525

## 2020-07-14 NOTE — Telephone Encounter (Signed)
6 refills on all

## 2020-07-14 NOTE — Telephone Encounter (Signed)
Hospital follow up march 2022

## 2020-07-16 ENCOUNTER — Ambulatory Visit (INDEPENDENT_AMBULATORY_CARE_PROVIDER_SITE_OTHER): Payer: Medicaid Other | Admitting: Family Medicine

## 2020-07-16 ENCOUNTER — Other Ambulatory Visit: Payer: Self-pay

## 2020-07-16 ENCOUNTER — Encounter: Payer: Self-pay | Admitting: Family Medicine

## 2020-07-16 VITALS — BP 122/80 | Temp 97.1°F | Wt 237.4 lb

## 2020-07-16 DIAGNOSIS — F411 Generalized anxiety disorder: Secondary | ICD-10-CM

## 2020-07-16 DIAGNOSIS — R600 Localized edema: Secondary | ICD-10-CM

## 2020-07-16 DIAGNOSIS — E876 Hypokalemia: Secondary | ICD-10-CM | POA: Diagnosis not present

## 2020-07-16 DIAGNOSIS — M21371 Foot drop, right foot: Secondary | ICD-10-CM | POA: Diagnosis not present

## 2020-07-16 DIAGNOSIS — G9389 Other specified disorders of brain: Secondary | ICD-10-CM | POA: Diagnosis not present

## 2020-07-16 DIAGNOSIS — D6489 Other specified anemias: Secondary | ICD-10-CM | POA: Diagnosis not present

## 2020-07-16 DIAGNOSIS — G9340 Encephalopathy, unspecified: Secondary | ICD-10-CM | POA: Diagnosis not present

## 2020-07-16 DIAGNOSIS — E8809 Other disorders of plasma-protein metabolism, not elsewhere classified: Secondary | ICD-10-CM | POA: Diagnosis not present

## 2020-07-16 MED ORDER — POTASSIUM CHLORIDE CRYS ER 10 MEQ PO TBCR: 10.0000 meq | EXTENDED_RELEASE_TABLET | Freq: Two times a day (BID) | ORAL | 5 refills | Status: DC

## 2020-07-16 MED ORDER — FUROSEMIDE 20 MG PO TABS: ORAL_TABLET | ORAL | 3 refills | Status: DC

## 2020-07-16 MED ORDER — ALPRAZOLAM 1 MG PO TABS: 1.0000 mg | ORAL_TABLET | Freq: Four times a day (QID) | ORAL | 2 refills | Status: DC | PRN

## 2020-07-16 NOTE — Progress Notes (Signed)
   Subjective:    Patient ID: Belinda [name], female    DOB: 13-Apr-1980, 40 y.o.   MRN: [id number]  HPI Pt here for follow up. Pt has went from 216-237 in the past 3-4 weeks. Pt is on Cebutec 16 mg. Pt has been noticing pitting edema in her ankles. Left hand has been numb at times.   Patient has foot drop on the right side with weakness in the right leg status post severe COVID and stroke while in the hospital.  She has been hesitant to start back to rehab she is in the process of getting her COVID-vaccine  The patient gets urine drug screen twice per month has been doing a good job staying off of street drugs.  She has a longstanding history of anxiety and panic attacks.  She has been on Xanax for years.  She states she does not abuse the Xanax.  We have tried to taper down on this On some days she takes 3/day and occasionally 4/day this is a significant improvement compared to when she used to take 4 or more per day Review of Systems     Objective:   Physical Exam Lungs are clear heart regular pulse normal extremities trace edema in the upper leg and 1+ pitting edema in the lower leg Foot drop on the right side is noted Weakness in the right leg status post severe COVID      Assessment & Plan:  Pedal edema Add furosemide 20 mg Potassium twice daily Recheck in 4 to 6 weeks  We will work forward to getting her a brace for the foot drop In addition to this she will see neurology later in June  Xanax to no more than 3/day but occasionally 4/day we will stick with 98/month but they need to last 30 days no exceptions  Patient will get a copy of her urine drug screen sent to [place]  Morbid obesity she will watch her diet portions and try to lose weight

## 2020-07-16 NOTE — Patient Instructions (Signed)
Please do your labs before next visit

## 2020-07-17 LAB — COMPREHENSIVE METABOLIC PANEL
ALT: 17 IU/L (ref 0–32)
AST: 19 IU/L (ref 0–40)
Albumin/Globulin Ratio: 1.4 (ref 1.2–2.2)
Albumin: 4.2 g/dL (ref 3.8–4.8)
Alkaline Phosphatase: 63 IU/L (ref 44–121)
BUN/Creatinine Ratio: 29 — ABNORMAL HIGH (ref 9–23)
BUN: 20 mg/dL (ref 6–20)
Bilirubin Total: 0.3 mg/dL (ref 0.0–1.2)
CO2: 22 mmol/L (ref 20–29)
Calcium: 8.9 mg/dL (ref 8.7–10.2)
Chloride: 107 mmol/L — ABNORMAL HIGH (ref 96–106)
Creatinine, Ser: 0.69 mg/dL (ref 0.57–1.00)
Globulin, Total: 2.9 g/dL (ref 1.5–4.5)
Glucose: 91 mg/dL (ref 65–99)
Potassium: 4.7 mmol/L (ref 3.5–5.2)
Sodium: 142 mmol/L (ref 134–144)
Total Protein: 7.1 g/dL (ref 6.0–8.5)
eGFR: 113 mL/min/{1.73_m2} (ref >59)

## 2020-07-17 LAB — CBC WITH DIFFERENTIAL/PLATELET
Basophils Absolute: 0 10*3/uL (ref 0.0–0.2)
Basos: 0 %
EOS (ABSOLUTE): 0.2 10*3/uL (ref 0.0–0.4)
Eos: 3 %
Hematocrit: 39.1 % (ref 34.0–46.6)
Hemoglobin: 13 g/dL (ref 11.1–15.9)
Immature Grans (Abs): 0 10*3/uL (ref 0.0–0.1)
Immature Granulocytes: 0 %
Lymphocytes Absolute: 3.3 10*3/uL — ABNORMAL HIGH (ref 0.7–3.1)
Lymphs: 48 %
MCH: 28.6 pg (ref 26.6–33.0)
MCHC: 33.2 g/dL (ref 31.5–35.7)
MCV: 86 fL (ref 79–97)
Monocytes Absolute: 0.5 10*3/uL (ref 0.1–0.9)
Monocytes: 8 %
Neutrophils Absolute: 2.7 10*3/uL (ref 1.4–7.0)
Neutrophils: 41 %
Platelets: 253 10*3/uL (ref 150–450)
RBC: 4.55 x10E6/uL (ref 3.77–5.28)
RDW: 14 % (ref 11.7–15.4)
WBC: 6.8 10*3/uL (ref 3.4–10.8)

## 2020-07-18 ENCOUNTER — Encounter: Payer: Self-pay | Admitting: Family Medicine

## 2020-07-24 ENCOUNTER — Telehealth: Payer: Self-pay

## 2020-07-24 NOTE — Telephone Encounter (Signed)
Belinda [name] dropped off paperwork from Toxicology Report   Pt call back [phone]

## 2020-07-28 NOTE — Telephone Encounter (Signed)
Toxicology report was legitimate, to be scanned into the system

## 2020-08-05 ENCOUNTER — Encounter: Payer: Self-pay | Admitting: Neurology

## 2020-08-05 ENCOUNTER — Ambulatory Visit: Payer: Medicaid Other | Admitting: Neurology

## 2020-08-05 VITALS — BP 91/63 | HR 88

## 2020-08-05 DIAGNOSIS — R269 Unspecified abnormalities of gait and mobility: Secondary | ICD-10-CM

## 2020-08-05 DIAGNOSIS — R29898 Other symptoms and signs involving the musculoskeletal system: Secondary | ICD-10-CM | POA: Diagnosis not present

## 2020-08-05 DIAGNOSIS — U099 Post covid-19 condition, unspecified: Secondary | ICD-10-CM

## 2020-08-05 MED ORDER — GABAPENTIN 300 MG PO CAPS: 900.0000 mg | ORAL_CAPSULE | Freq: Three times a day (TID) | ORAL | 6 refills | Status: DC

## 2020-08-05 NOTE — Patient Instructions (Signed)
Orders Placed This Encounter  Procedures   MR BRAIN W WO CONTRAST   MR CERVICAL SPINE W WO CONTRAST   MR THORACIC SPINE W WO CONTRAST   CK   TSH   NCV with EMG(electromyography)      Meds ordered this encounter  Medications   gabapentin (NEURONTIN) 300 MG capsule    Sig: Take 3 capsules (900 mg total) by mouth 3 (three) times daily.    Dispense:  270 capsule    Refill:  6

## 2020-08-05 NOTE — Progress Notes (Signed)
Chief Complaint  Patient presents with   New Patient (Initial Visit)    She is here with her mother, Shauna Hugh. Referral for NCV/EMG for right foot drop. Past abnormal MRI brain. Hx of long COVID.      ASSESSMENT AND PLAN  Belinda [name] is a 40 y.o. female   Gait abnormality Right-sided weakness Urinary incontinence, worsening constipation Following COVID infection, respiratory failure, required intubation, tracheostomy in December 2021  On examination, she is very anxious, emotional, in tears, mild right shallow nasolabial fold, mild right arm pronation drift, mild right leg drift, hyperreflexia of right lower extremity, right Babinski sign, profound gait abnormality, stiff, dragging right leg more  Potential localization to left hemisphere, versus right cervical/thoracic spinal cord, low possibility of right lumbar radiculopathy/right lower extremity peripheral neuropathy  MRI of brain, cervical, thoracic spine with without contrast  TSH, CPK  EMG nerve conduction study    DIAGNOSTIC DATA (LABS, IMAGING, TESTING) - I reviewed patient records, labs, notes, testing and imaging myself where available.  Lab in May 2022:CMP, creat 0.69, CBC, Hg 13,  troponin negative, INR 1.0, D-dimer, on Dec 20 21, 2.6, most recent on May 07 9626, 3.66, folic acid 29.4,  negative hepatitis B, C, A, HIV Ferritin 630 from 3852, B12 590, prolactin 115, acth 21.4, TSH 3.4, Glyceride 182,  CK 200,   CT Chest on May 06 2020 1. No pulmonary embolus. 2. Dense consolidation in the anteromedial left lower lobe with air bronchograms consistent with pneumonia. 3. Streaky and bandlike opacities in the lingula, right lower and right middle lobes, favor post infectious/inflammatory scarring. 4. Mild cardiomegaly. Trace pericardial effusion.   HISTORICAL  Belinda [name] is a 40 year old female, seen in request by his primary care physician Dr. Wolfgang Phoenix, Nicki Reaper for evaluation of gait abnormality, following  her COVID infection in December 2021.  Initial evaluation was on August 05, 2020   I reviewed and summarized the referring note. PMHx,  Anxiety, frequent panic attack, taking Xanax 1 mg 4 times a day since 2007, History of chronic prescription opiates abuse, but has been on stable dose of buprenorphine sublingual since 2017, instead of getting prescription from her primary care physician, she was getting it from street by paying cashes daily. Psoriasis, never received systemic immunosuppressive treatment, using topical [place] only  Patient was not vaccinated, was admitted to [facility] on [facility], 2020 for severe hypoxic respiratory failure, ARDS, and metabolic encephalopathy secondary to COVID, requiring intubation, ICU transfer to [facility], required prolonged ventilation support, had a tracheostomy on December 28, she also had aspiration pneumonia, pneumonia due to COVID-19, she did receive IV remdesivir, steroid, baricitinib, Klebsiella urinary tract infection, with sepsis, was treated with Unasyn, cefazolin, she has developed volume overload, required diuresis with Lasix, anemia of critical illness, dysphagia,  Hospital course was also complicated by acute metabolic encephalopathy, agitation, due to her long history of benzo, and opiates use, she was started on scheduled methadone, Dilaudid as needed, eventually was able to go back to her previous medication combination of lower dose of Xanax 1 mg 3 times a day instead of 4 times a day, weaned off Dilaudid, methadone, now on prescription buprenorphine 8 mg sublingual, 2 tablets daily,  Patient is tearful, very anxious during today's interview, prior to her hospital admission, 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, reported highly function, other than her previous history of prescription opiate use, which she successfully transferred to buprenorphine, even though she by daily paying cash from street, instead  of getting prescription to the right  channel, she reported she has been adherence to her buprenorphine use, which did help her craving, make her function better  Since hospital admission and prolonged rehabilitation, eventually hospital discharge on March 18, 2020, she had a profound gait abnormality, left leg has regained significant recovery, she reported right leg weakness, but denies significant sensory loss, noticed mild right hand weakness, left hand dominant hand has numbness involving left fourth and fifth fingers, mild weakness,  In addition, she complains of new onset urinary urgency, occasionally incontinence, worsening constipation, anxiety, continued significant gait abnormality   PHYSICAL EXAM:   Vitals:   Not recorded     There is no height or weight on file to calculate BMI.  PHYSICAL EXAMNIATION: Very anxious, talkative, in tears sometimes  Gen: NAD, conversant, well nourised, well groomed                     Cardiovascular: Regular rate rhythm, no peripheral edema, warm, nontender. Eyes: Conjunctivae clear without exudates or hemorrhage Neck: Supple, no carotid bruits. Pulmonary: Clear to auscultation bilaterally   NEUROLOGICAL EXAM:  MENTAL STATUS: Speech:    Speech is normal; fluent and spontaneous with normal comprehension.  Cognition:     Orientation to time, place and person     Normal recent and remote memory     Normal Attention span and concentration     Normal Language, naming, repeating,spontaneous speech     Fund of knowledge   CRANIAL NERVES: CN II: Visual fields are full to confrontation. Pupils are round equal and briskly reactive to light. CN III, IV, VI: extraocular movement are normal. No ptosis. CN V: Facial sensation is intact to light touch CN VII: Mild shallow right nasolabial fold CN VIII: Hearing is normal to causal conversation. CN IX, X: Phonation is normal. CN XI: Head turning and shoulder shrug are intact  MOTOR: Bilateral hand fine posturing tremor, mild  right arm pronation drift, mild bilateral hand grip weakness, right worse than left; mild right lower extremity drift,  REFLEXES: Reflexes are 2+ and symmetric at the biceps, triceps, right 3/left 2 knees, and right 2/left absent ankles. Plantar responses are extensor on right, flexor on left  SENSORY: Preserved to light touch, pinprick, vibratory sensation  COORDINATION: There is no trunk or limb dysmetria noted.  GAIT/STANCE: She needs assistance to get up from seated position, difficulty initiate gait, stiff, dragging right leg, very anxious when ambulating,  REVIEW OF SYSTEMS:  Full 14 system review of systems performed and notable only for as above All other review of systems were negative.   ALLERGIES: Allergies  Allergen Reactions   Buspar [Buspirone]     Mania/insomnia   Celexa [Citalopram Hydrobromide]     Insomnia    Celexa [Citalopram]     mania   Doxycycline Diarrhea    diarrhea   Wellbutrin [Bupropion]     Increased anxiety   Wellbutrin [Bupropion]     insomnia   Zoloft [Sertraline Hcl]     Increased anxiety   Zoloft [Sertraline]     mania   Augmentin [Amoxicillin-Pot Clavulanate]     Nausea and vomiting with Augmentin, no rash   Doxycycline Rash    HOME MEDICATIONS: Current Outpatient Medications  Medication Sig Dispense Refill   acetaminophen (TYLENOL) 500 MG tablet Take 500 mg by mouth every 6 (six) hours as needed.     ALPRAZolam (XANAX) 1 MG tablet Take 1 tablet (1 mg total) by mouth 4 (  four) times daily as needed for anxiety. 98 tablet 2   buprenorphine (SUBUTEX) 8 MG SUBL SL tablet Place 16 mg under the tongue daily.     cetirizine (ZYRTEC) 10 MG tablet Take 10 mg by mouth daily.     clobetasol (TEMOVATE) 0.05 % external solution APPLY EXTERNALLY TO THE AFFECTED AREA TWICE DAILY AS NEEDED. 100 mL 5   Colloidal Oatmeal (GOLD BOND ECZEMA RELIEF) 2 % CREA Apply 1 application topically as needed.     dexlansoprazole (DEXILANT) 60 MG capsule TAKE 1  CAPSULE (60 MG TOTAL) BY MOUTH DAILY. 30 capsule 0   docusate sodium (COLACE) 100 MG capsule Take 100 mg by mouth as needed for mild constipation.     famotidine (PEPCID) 40 MG tablet Take 40 mg by mouth daily.     FLOVENT HFA 110 MCG/ACT inhaler INHALE 1 PUFF TWICE A DAY FOR ASTHMA. 12 g 5   fluticasone (FLONASE) 50 MCG/ACT nasal spray USE (2) SPRAYS IN EACH NOSTRIL ONCE DAILY. 16 g 5   furosemide (LASIX) 20 MG tablet 1/2 to 1 qam prn swelling in legs 30 tablet 3   hydrocortisone 1 % lotion Apply 1 application topically as needed for itching.     ibuprofen (ADVIL) 200 MG tablet Take 200 mg by mouth every 6 (six) hours as needed.     LINZESS 290 MCG CAPS capsule Take 290 mcg by mouth daily.     metoprolol tartrate (LOPRESSOR) 25 MG tablet TAKE (1/2) TABLET BY MOUTH 2 TIMES A DAY. 30 tablet 5   potassium chloride (KLOR-CON) 10 MEQ tablet Take 1 tablet (10 mEq total) by mouth 2 (two) times daily. 60 tablet 5   PROAIR HFA 108 (90 Base) MCG/ACT inhaler Inhale 2 puffs into the lungs every 4 (four) hours as needed for wheezing or shortness of breath.     No current facility-administered medications for this visit.    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    FAMILY HISTORY: Family History  Problem Relation Age of Onset   High blood pressure Father    Stroke Father    High blood pressure Brother    Hypertension Father    Heart attack Father    Heart disease Maternal Grandmother  Hypertension Brother    Breast cancer Maternal Aunt    Colon cancer Neg Hx     SOCIAL HISTORY: Social History   Socioeconomic History   Marital status: Legally Separated    Spouse name: Not on file   Number of children: 2   Years of education: some college   Highest education level: Not on file  Occupational History   Occupation: unemployed  Tobacco Use   Smoking status: Former    Pack years: 0.00    Types: Cigarettes   Smokeless tobacco: Never   Tobacco comments:    light smoker 5 cigs daily  Vaping Use   Vaping Use: Never used  Substance and Sexual Activity   Alcohol use: No   Drug use: No    Comment: Denies   Sexual activity: Yes    Birth control/protection: Pill  Other Topics Concern   Not on file  Social History Narrative   ** Merged History Encounter **       Social Determinants of Health   Financial Resource Strain: Not on file  Food Insecurity: Not on file  Transportation Needs: Not on file  Physical Activity: Not on file  Stress: Not on file  Social Connections: Not on file  Intimate Partner Violence: Not on file   Total time spent reviewing the chart, obtaining history, examined patient, ordering tests, documentation, consultations and family, care coordination was 19 minutes     Marcial Pacas, M.D. Ph.D.  [facility] [address] [place], [place] [address] Ph: [phone] Fax:  [phone]  CC:  Kathyrn Drown, MD [place],  [place] [id number]  Kathyrn Drown, MD

## 2020-08-06 LAB — CK: Total CK: 83 U/L (ref 32–182)

## 2020-08-06 LAB — TSH: TSH: 2.53 u[IU]/mL (ref 0.450–4.500)

## 2020-08-07 ENCOUNTER — Telehealth: Payer: Self-pay | Admitting: Neurology

## 2020-08-07 NOTE — Telephone Encounter (Signed)
Pt is asking for a call from RN re: if any company calls to discuss her medications she is wanting minimum information provided to [employer](or any other company) that may inquire about the medications she is on.

## 2020-08-07 NOTE — Telephone Encounter (Addendum)
I attempted to call the patient back. I received a message that my "call cannot be completed at this time". This information has been noted. As always, we will follow HIPAA guidelines when handling patient information.

## 2020-08-07 NOTE — Telephone Encounter (Signed)
I spoke to the patient. States [name] is the facility that takes care of her pain management. She has signed a medical release form and does not mind them getting her records, if requested. She called because she was concerned they "would tell Dr. Krista Blue what to prescribe". She understands Dr. Krista Blue will make her own treatment decisions.

## 2020-08-11 ENCOUNTER — Other Ambulatory Visit: Payer: Self-pay | Admitting: *Deleted

## 2020-08-11 NOTE — Patient Outreach (Signed)
Care Coordination  08/11/2020  Belinda [name] 04/25/1980 [id number]   Medicaid Managed Care   Unsuccessful Outreach Note  08/11/2020 Name: Belinda [name] MRN: [id number] DOB: 04/04/1980  Referred by: Kathyrn Drown, MD Reason for referral : High Risk Managed Medicaid (Unsuccessful RNCM initial outreach)   An unsuccessful telephone outreach was attempted today. The patient was referred to the case management team for assistance with care management and care coordination.   Follow Up Plan: A HIPAA compliant phone message was left for the patient providing contact information and requesting a return call.  The care management team will reach out to the patient again over the next 14 days.   Lurena Joiner RN, BSN [place]  [employer] manager

## 2020-08-11 NOTE — Patient Instructions (Signed)
Visit Information  Ms. Belinda [name]  - as a part of your Medicaid benefit, you are eligible for care management and care coordination services at no cost or copay. I was unable to reach you by phone today but would be happy to help you with your health related needs. Please feel free to call me @ [phone] or to reschedule, call Anderson Malta @ [phone].   A member of the Managed Medicaid care management team will reach out to you again over the next 7-14 days.   Lurena Joiner RN, BSN [place]  [employer] manager

## 2020-08-12 ENCOUNTER — Other Ambulatory Visit: Payer: Self-pay | Admitting: Internal Medicine

## 2020-08-14 ENCOUNTER — Other Ambulatory Visit: Payer: Self-pay

## 2020-08-14 ENCOUNTER — Telehealth: Payer: Self-pay | Admitting: Family Medicine

## 2020-08-14 DIAGNOSIS — N898 Other specified noninflammatory disorders of vagina: Secondary | ICD-10-CM

## 2020-08-14 MED ORDER — FLUCONAZOLE 150 MG PO TABS: ORAL_TABLET | ORAL | 0 refills | Status: DC

## 2020-08-14 NOTE — Telephone Encounter (Signed)
Patient reported vaginal itching and discharge, per protocol Diflucan 150 mg was sent to pharmacy.

## 2020-08-14 NOTE — Telephone Encounter (Signed)
Patient is requesting something for yeast infection to be called into [facility]

## 2020-08-18 ENCOUNTER — Telehealth: Payer: Self-pay

## 2020-08-18 NOTE — Telephone Encounter (Signed)
Cover My Meds --approval for Dexlansoprazole 60 mg approved 08/18/2020 through 08/18/2021. Dx used K21.9. pt tried Carafate, Protonix, and Omeprazole. Will give to [facility] for scan.

## 2020-08-19 ENCOUNTER — Telehealth: Payer: Self-pay | Admitting: Neurology

## 2020-08-19 ENCOUNTER — Telehealth: Payer: Self-pay | Admitting: Family Medicine

## 2020-08-19 DIAGNOSIS — R29898 Other symptoms and signs involving the musculoskeletal system: Secondary | ICD-10-CM

## 2020-08-19 DIAGNOSIS — R269 Unspecified abnormalities of gait and mobility: Secondary | ICD-10-CM

## 2020-08-19 DIAGNOSIS — U099 Post covid-19 condition, unspecified: Secondary | ICD-10-CM

## 2020-08-19 NOTE — Telephone Encounter (Signed)
Patient has followup visit on 6/29 but wants to push out after her visit with urology so she would have more to go over with you. Her appointment with them is 7/11. Please advise if she need to cancel

## 2020-08-19 NOTE — Telephone Encounter (Signed)
Please advise. Thank you

## 2020-08-19 NOTE — Telephone Encounter (Signed)
That is her option Currently right now our office visits are few and far between so if she cancels for this it would probably be the end of July before she could be seen

## 2020-08-19 NOTE — Telephone Encounter (Signed)
Received approval from [facility] for patient's 3 MRI's- CPT [id number], [id number], [id number]. [place] #[id number] (08/06/20- 10/05/20). Sent to [place]. They will call patient to schedule.

## 2020-08-20 ENCOUNTER — Ambulatory Visit: Payer: Medicaid Other | Admitting: Family Medicine

## 2020-09-01 ENCOUNTER — Encounter: Payer: Medicaid Other | Admitting: Neurology

## 2020-09-09 ENCOUNTER — Other Ambulatory Visit: Payer: Self-pay | Admitting: Family Medicine

## 2020-09-11 NOTE — Telephone Encounter (Signed)
Received call from patient stating she needs to be under anesthesia for MRI studies. Can these orders be changed to reflect this? I will need to obtain new authorization for these to be completed at [facility].

## 2020-09-11 NOTE — Telephone Encounter (Signed)
Orders Placed This Encounter  Procedures   MR BRAIN W WO CONTRAST   MR CERVICAL SPINE W WO CONTRAST   MR THORACIC SPINE W WO CONTRAST   MR LUMBAR SPINE WO CONTRAST      I reentered the order for MRI to be done under general anesthesia at [facility], also add on MRI of lumbar spine without contrast

## 2020-09-11 NOTE — Addendum Note (Signed)
Addended by: Marcial Pacas on: 09/11/2020 02:53 PM   Modules accepted: Orders

## 2020-09-11 NOTE — Telephone Encounter (Signed)
Returned call back regarding MRI options.   Patient stated she has been on xanax for 15 years and they will not help her get through an MRI.     She is requesting to be put under general anesthesia for the MRIs.  She has PTSD from being in the hospital.    Update sent to [facility] for revised order.  Patient denied further questions, verbalized understanding and expressed appreciation for the phone call.

## 2020-09-11 NOTE — Telephone Encounter (Signed)
Please call patient, patient with claustrophobia, we can make arrangements of such open MRI after taking Xanax  Only if she cannot tolerate that, we will proceed with MRI under general anesthesia, which can only be done at hospital, under close supervision

## 2020-09-17 ENCOUNTER — Encounter: Payer: Medicaid Other | Admitting: Neurology

## 2020-09-17 ENCOUNTER — Other Ambulatory Visit: Payer: Self-pay

## 2020-09-17 ENCOUNTER — Ambulatory Visit (INDEPENDENT_AMBULATORY_CARE_PROVIDER_SITE_OTHER): Payer: Medicaid Other | Admitting: Neurology

## 2020-09-17 ENCOUNTER — Telehealth: Payer: Self-pay | Admitting: Neurology

## 2020-09-17 DIAGNOSIS — U099 Post covid-19 condition, unspecified: Secondary | ICD-10-CM

## 2020-09-17 DIAGNOSIS — R29898 Other symptoms and signs involving the musculoskeletal system: Secondary | ICD-10-CM | POA: Diagnosis not present

## 2020-09-17 DIAGNOSIS — R269 Unspecified abnormalities of gait and mobility: Secondary | ICD-10-CM

## 2020-09-17 DIAGNOSIS — Z0289 Encounter for other administrative examinations: Secondary | ICD-10-CM

## 2020-09-17 NOTE — Telephone Encounter (Signed)
Please make sure she is on schedule for MRIs under general anesthesia at [facility]

## 2020-09-17 NOTE — Telephone Encounter (Signed)
Submitted request to [facility]. Request is pending. Reference #[id number]. Tracking #[id number]. Patient is aware this can take up to 2 weeks to get approved.

## 2020-09-17 NOTE — Procedures (Signed)
Full Name: Belinda [name] Gender: Female MRN #: [id number] Date of Birth: 06/02/1980    Visit Date: 09/17/2020 07:58 Age: 40 Years Examining Physician: Marcial Pacas, MD  Referring Physician: Marcial Pacas, MD History: 40 year old female presented with gait abnormality,  Summary of the test: Nerve conduction study: Right sural, superficial peroneal, median, ulnar sensory responses were normal. Right peroneal to EDB, tibial, median and ulnar motor responses were normal.  Electromyography: Selected needle examination of right upper, lower extremity muscles right cervical and lumbar sacral paraspinal muscles were normal.  Conclusion: This is a normal study.  There is no electrodiagnostic evidence of right cervical radiculopathy, or right lumbosacral radiculopathy.      ------------------------------- Marcial Pacas, MD, PhD  [facility] [address] [place], [place] [address] Tel: [phone] Fax: [phone]  Verbal informed consent was obtained from the patient, patient was informed of potential risk of procedure, including bruising, bleeding, hematoma formation, infection, muscle weakness, muscle pain, numbness, among others.        [place]    Nerve / Sites Muscle Latency Ref. Amplitude Ref. Rel Amp Segments Distance Velocity Ref. Area    ms ms mV mV %  cm m/s m/s mVms  R Median - APB     Wrist APB 3.6 ?4.4 4.2 ?4.0 100 Wrist - APB 7   13.0     Upper arm APB 7.2  3.8  92.1 Upper arm - Wrist 20 55 ?49 12.9  R Ulnar - ADM     Wrist ADM 2.5 ?3.3 7.9 ?6.0 100 Wrist - ADM 7   21.6     B.Elbow ADM 5.4  7.7  97.1 B.Elbow - Wrist 19 66 ?49 20.8     A.Elbow ADM 6.9  7.2  93.7 A.Elbow - B.Elbow 10 66 ?49 20.8  R Peroneal - EDB     Ankle EDB 4.4 ?6.5 3.6 ?2.0 100 Ankle - EDB 9   11.3     Fib head EDB 10.1  3.3  91 Fib head - Ankle 26 46 ?44 10.7     Pop fossa EDB 12.4  3.1  93.4 Pop fossa - Fib head 10 44 ?44 9.6         Pop fossa - Ankle      R Tibial -  AH     Ankle AH 3.3 ?5.8 5.4 ?4.0 100 Ankle - AH 9   8.0     Pop fossa AH 11.6  4.0  74.4 Pop fossa - Ankle 35 42 ?41 6.2             SNC    Nerve / Sites Rec. Site Peak Lat Ref.  Amp Ref. Segments Distance Peak Diff Ref.    ms ms V V  cm ms ms  R Sural - Ankle (Calf)     Calf Ankle 2.9 ?4.4 17 ?6 Calf - Ankle 14    R Superficial peroneal - Ankle     Lat leg Ankle 3.2 ?4.4 8 ?6 Lat leg - Ankle 14    R Median, Ulnar - Transcarpal comparison     Median Palm Wrist 2.3 ?2.2 64 ?35 Median Palm - Wrist 8       Ulnar Palm Wrist 2.3 ?2.2 16 ?12 Ulnar Palm - Wrist 8          Median Palm - Ulnar Palm  -0.1 ?0.4  R Median - Orthodromic (Dig II, Mid palm)     Dig  II Wrist 3.0 ?3.4 10 ?10 Dig II - Wrist 13    R Ulnar - Orthodromic, (Dig V, Mid palm)     Dig V Wrist 2.9 ?3.1 7 ?5 Dig V - Wrist 57                 F  Wave    Nerve F Lat Ref.   ms ms  R Tibial - AH 49.4 ?56.0  R Ulnar - ADM 25.5 ?32.0         EMG Summary Table    Spontaneous MUAP Recruitment  Muscle IA Fib PSW Fasc Other Amp Dur. Poly Pattern  R. Tibialis anterior Normal None None None _______ Normal Normal Normal Normal  R. Tibialis posterior Normal None None None _______ Normal Normal Normal Normal  R. Peroneus longus Normal None None None _______ Normal Normal Normal Normal  R. Gastrocnemius (Medial head) Normal None None None _______ Normal Normal Normal Normal  R. Vastus lateralis Normal None None None _______ Normal Normal Normal Normal  R. Lumbar paraspinals (low) Normal None None None _______ Normal Normal Normal Normal  R. Lumbar paraspinals (mid) Normal None None None _______ Normal Normal Normal Normal  R. First dorsal interosseous Normal None None None _______ Normal Normal Normal Normal  R. Pronator teres Normal None None None _______ Normal Normal Normal Normal  R. Biceps brachii Normal None None None _______ Normal Normal Normal Normal  R. Deltoid Normal None None None _______ Normal Normal Normal Normal  R.  Extensor digitorum communis Normal None None None _______ Normal Normal Normal Normal  R. Cervical paraspinals Normal None None None _______ Normal Normal Normal Normal

## 2020-09-17 NOTE — Telephone Encounter (Signed)
Submitted request to [facility]. Request is pending. Reference #[id number]. Tracking #[id number]. Patient is aware this can take up to 2 weeks to get approved.

## 2020-09-17 NOTE — Progress Notes (Addendum)
No chief complaint on file.     ASSESSMENT AND PLAN  Belinda [name] is a 40 y.o. female   Gait abnormality Right-sided weakness Urinary incontinence, worsening constipation Following COVID infection, respiratory failure, required intubation, tracheostomy in December 2021  EMG nerve conduction study on September 17, 2020 was normal, no evidence of right cervical or lumbosacral radiculopathy, she has variable effort fear when ambulate, wide-based cautious unsteady gait  Laboratory evaluation including TSH CPK was normal  Will continue with the plan of MRI of neuraxis under general anesthesia, she reported significant claustrophobia,  She reported improvement with higher dose of gabapentin 300 mg 3 tablets 3 times a day, will continue current medications, may continue prescription through her primary care physician  Arrange home health rehab, wheel chair Rx.   DIAGNOSTIC DATA (LABS, IMAGING, TESTING) - I reviewed patient records, labs, notes, testing and imaging myself where available.  Lab in May 2022:CMP, creat 0.69, CBC, Hg 13,  troponin negative, INR 1.0, D-dimer, on Dec 20 21, 2.6, most recent on May 07 7414, 3.84, folic acid 53.6,  negative hepatitis B, C, A, HIV Ferritin 630 from 3852, B12 590, prolactin 115, acth 21.4, TSH 3.4, Glyceride 182,  CK 200,   CT Chest on May 06 2020 1. No pulmonary embolus. 2. Dense consolidation in the anteromedial left lower lobe with air bronchograms consistent with pneumonia. 3. Streaky and bandlike opacities in the lingula, right lower and right middle lobes, favor post infectious/inflammatory scarring. 4. Mild cardiomegaly. Trace pericardial effusion.   HISTORICAL  Belinda [name] is a 40 year old female, seen in request by his primary care physician Dr. Wolfgang Phoenix, Nicki Reaper for evaluation of gait abnormality, following her COVID infection in December 2021.  Initial evaluation was on August 05, 2020   I reviewed and summarized the referring  note. PMHx,  Anxiety, frequent panic attack, taking Xanax 1 mg 4 times a day since 2007, History of chronic prescription opiates abuse, but has been on stable dose of buprenorphine sublingual since 2017, instead of getting prescription from her primary care physician, she was getting it from street by paying cashes daily. Psoriasis, never received systemic immunosuppressive treatment, using topical [place] only  Patient was not vaccinated, was admitted to [facility] on February 06, 2020 for severe hypoxic respiratory failure, ARDS, and metabolic encephalopathy secondary to COVID, requiring intubation, ICU transfer to [facility], required prolonged ventilation support, had a tracheostomy on December 28, she also had aspiration pneumonia, pneumonia due to COVID-19, she did receive IV remdesivir, steroid, baricitinib, Klebsiella urinary tract infection, with sepsis, was treated with Unasyn, cefazolin, she has developed volume overload, required diuresis with Lasix, anemia of critical illness, dysphagia,  Hospital course was also complicated by acute metabolic encephalopathy, agitation, due to her long history of benzo, and opiates use, she was started on scheduled methadone, Dilaudid as needed, eventually was able to go back to her previous medication combination of lower dose of Xanax 1 mg 3 times a day instead of 4 times a day, weaned off Dilaudid, methadone, now on prescription buprenorphine 8 mg sublingual, 2 tablets daily,  Patient is tearful, very anxious during today's interview, prior to her hospital admission, 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, reported highly function, other than her previous history of prescription opiate use, which she successfully transferred to buprenorphine, even though she by daily paying cash from street, instead of getting prescription to the right channel, she reported she has been adherence to her buprenorphine use, which did help her  craving, make her function  better  Since hospital admission and prolonged rehabilitation, eventually hospital discharge on March 18, 2020, she had a profound gait abnormality, left leg has regained significant recovery, she reported right leg weakness, but denies significant sensory loss, noticed mild right hand weakness, left hand dominant hand has numbness involving left fourth and fifth fingers, mild weakness,  In addition, she complains of new onset urinary urgency, occasionally incontinence, worsening constipation, anxiety, continued significant gait abnormality  Update September 17, 2020: She return for electrodiagnostic study today, which is normal, in specific, there is no evidence of intrinsic muscle disease, right lumbar or cervical radiculopathy,  Laboratory evaluation showed normal TSH, CPK, CMP, CBC, UDS was positive for Xanax, buprenorphine and their metabolites  She reported severe claustrophobia, MRI was arranged to be done under general anesthesia at hospital to rule out neuraxis structural abnormality, with her complaints of continued gait abnormality, incontinence,   PHYSICAL EXAM:   There is no height or weight on file to calculate BMI.  PHYSICAL EXAMNIATION: Very anxious, talkative, in tears sometimes  Gen: NAD, conversant, well nourised, well groomed             NEUROLOGICAL EXAM:  MENTAL STATUS: Speech/cognition     Anxious looking middle-aged female, speech is normal; fluent and spontaneous with normal comprehension.   CRANIAL NERVES: CN II: Visual fields are full to confrontation. Pupils are round equal and briskly reactive to light. CN III, IV, VI: extraocular movement are normal. No ptosis. CN V: Facial sensation is intact to light touch CN VII: Mild shallow right nasolabial fold CN VIII: Hearing is normal to causal conversation. CN IX, X: Phonation is normal. CN XI: Head turning and shoulder shrug are intact  MOTOR: Intermittent effort, no significant bilateral upper and lower  extremity proximal and distal muscle weakness noted  REFLEXES: Reflexes are symmetric, plantar responses are flexor bilaterally  SENSORY: Preserved to light touch, pinprick, vibratory sensation  COORDINATION: There is no trunk or limb dysmetria noted.  GAIT/STANCE: She needs assistance to get up from seated position, bilateral lower extremity orthostatic tremor, exaggerated effort, wide-based unsteady, need continuous encouragement and reassurance  REVIEW OF SYSTEMS:  Full 14 system review of systems performed and notable only for as above All other review of systems were negative.   ALLERGIES: Allergies  Allergen Reactions   Buspar [Buspirone]     Mania/insomnia   Celexa [Citalopram Hydrobromide]     Insomnia    Celexa [Citalopram]     mania   Doxycycline Diarrhea    diarrhea   Wellbutrin [Bupropion]     Increased anxiety   Wellbutrin [Bupropion]     insomnia   Zoloft [Sertraline Hcl]     Increased anxiety   Zoloft [Sertraline]     mania   Augmentin [Amoxicillin-Pot Clavulanate]     Nausea and vomiting with Augmentin, no rash   Doxycycline Rash    HOME MEDICATIONS: Current Outpatient Medications  Medication Sig Dispense Refill   acetaminophen (TYLENOL) 500 MG tablet Take 500 mg by mouth every 6 (six) hours as needed.     ALPRAZolam (XANAX) 1 MG tablet Take 1 tablet (1 mg total) by mouth 4 (four) times daily as needed for anxiety. 98 tablet 2   buprenorphine (SUBUTEX) 8 MG SUBL SL tablet Place 16 mg under the tongue daily.     cetirizine (ZYRTEC) 10 MG tablet Take 10 mg by mouth daily.     clobetasol (TEMOVATE) 0.05 % external solution APPLY EXTERNALLY TO THE  AFFECTED AREA TWICE DAILY AS NEEDED. 100 mL 5   Colloidal Oatmeal (GOLD BOND ECZEMA RELIEF) 2 % CREA Apply 1 application topically as needed.     DEXILANT 60 MG capsule TAKE 1 CAPSULE BY MOUTH ONCE DAILY. 30 capsule 5   docusate sodium (COLACE) 100 MG capsule Take 100 mg by mouth as needed for mild  constipation.     famotidine (PEPCID) 40 MG tablet TAKE 1 TABLET ONCE DAILY. 30 tablet 6   FLOVENT HFA 110 MCG/ACT inhaler INHALE 1 PUFF TWICE A DAY FOR ASTHMA. 12 g 5   fluconazole (DIFLUCAN) 150 MG tablet TAKE ONE TABLET BY MOUTH 3 DAYS APART 2 tablet 0   fluticasone (FLONASE) 50 MCG/ACT nasal spray USE (2) SPRAYS IN EACH NOSTRIL ONCE DAILY. 16 g 5   furosemide (LASIX) 20 MG tablet 1/2 to 1 qam prn swelling in legs 30 tablet 3   gabapentin (NEURONTIN) 300 MG capsule Take 3 capsules (900 mg total) by mouth 3 (three) times daily. 270 capsule 6   hydrocortisone 1 % lotion Apply 1 application topically as needed for itching.     ibuprofen (ADVIL) 200 MG tablet Take 200 mg by mouth every 6 (six) hours as needed.     LINZESS 290 MCG CAPS capsule Take 290 mcg by mouth daily.     metoprolol tartrate (LOPRESSOR) 25 MG tablet TAKE (1/2) TABLET BY MOUTH 2 TIMES A DAY. 30 tablet 5   potassium chloride (KLOR-CON) 10 MEQ tablet Take 1 tablet (10 mEq total) by mouth 2 (two) times daily. 60 tablet 5   PROAIR HFA 108 (90 Base) MCG/ACT inhaler Inhale 2 puffs into the lungs every 4 (four) hours as needed for wheezing or shortness of breath.     No current facility-administered medications for this visit.    PAST MEDICAL HISTORY: Past Medical History:  Diagnosis Date   Anxiety    compulsive worring, phobia   Anxiety    Anxiety disorder    Asthma    Bipolar affective ([place])    Fatty liver 11/14/2018   Fatty liver    GAD (generalized anxiety disorder)    with panic attacks   GERD (gastroesophageal reflux disease)    History of COVID-19    History of shingles    Morbid obesity (HCC)    Narcotic addiction ([place])    Narcotic drug use    OCD (obsessive compulsive disorder)    Psoriasis    Reactive airways dysfunction syndrome (HCC)    Right foot drop    Vaginal Pap smear, abnormal     PAST SURGICAL HISTORY: Past Surgical History:  Procedure Laterality Date   CESAREAN SECTION     twice    COLPOSCOPY     DILATION AND CURETTAGE OF UTERUS     ESOPHAGOGASTRODUODENOSCOPY  07/2018   ESOPHAGOGASTRODUODENOSCOPY (EGD) WITH PROPOFOL N/A 08/03/2018   Procedure: ESOPHAGOGASTRODUODENOSCOPY (EGD) WITH PROPOFOL;  Surgeon: Daneil Dolin, MD;  Location: [facility];  Service: Endoscopy;  Laterality: N/A;  11:00am   PID      FAMILY HISTORY: Family History  Problem Relation Age of Onset   High blood pressure Father    Stroke Father    High blood pressure Brother    Hypertension Father    Heart attack Father    Heart disease Maternal Grandmother    Hypertension Brother    Breast cancer Maternal Aunt    Colon cancer Neg Hx     SOCIAL HISTORY: Social History   Socioeconomic History  Marital status: Legally Separated    Spouse name: Not on file   Number of children: 2   Years of education: some college   Highest education level: Not on file  Occupational History   Occupation: unemployed  Tobacco Use   Smoking status: Former    Types: Cigarettes   Smokeless tobacco: Never   Tobacco comments:    light smoker 5 cigs daily  Vaping Use   Vaping Use: Never used  Substance and Sexual Activity   Alcohol use: No   Drug use: No    Comment: Denies   Sexual activity: Yes    Birth control/protection: Pill  Other Topics Concern   Not on file  Social History Narrative   Lives with son (boyfriend and mother supervise).   Left-handed.   Rare caffeine use.   Social Determinants of Health   Financial Resource Strain: Not on file  Food Insecurity: Not on file  Transportation Needs: Not on file  Physical Activity: Not on file  Stress: Not on file  Social Connections: Not on file  Intimate Partner Violence: Not on file      Marcial Pacas, M.D. Ph.D.  [facility] [address], [place], [place] [address] Ph: [phone] Fax: [phone]  CC:  Kathyrn Drown, MD [address],  [place] [address]  Kathyrn Drown, MD

## 2020-09-17 NOTE — Addendum Note (Signed)
Addended by: Marcial Pacas on: 09/17/2020 09:35 AM   Modules accepted: Orders

## 2020-09-25 ENCOUNTER — Telehealth: Payer: Self-pay | Admitting: Family Medicine

## 2020-09-25 NOTE — Telephone Encounter (Signed)
..   Medicaid Managed Care   Unsuccessful Outreach Note  09/25/2020 Name: Belinda [name] MRN: [id number] DOB: November 10, 1980  Referred by: Kathyrn Drown, MD Reason for referral : High Risk Managed Medicaid (I called the patient today to see if she wanted to reschedule her phone visit with the MM RNCM. She missed her appt in June. I left my name and number on her VM.)   An unsuccessful telephone outreach was attempted today. The patient was referred to the case management team for assistance with care management and care coordination.   Follow Up Plan: The care management team will reach out to the patient again over the next 7-14 days.   [place]

## 2020-09-29 NOTE — Telephone Encounter (Signed)
Received approval from [facility]. Sent orders to [facility] to be scheduled anesthesia. [facility] [place] #[id number] (10/13/20- 12-12-20). CPT codes [id number], [id number], [id number], [id number].

## 2020-10-14 ENCOUNTER — Telehealth: Payer: Self-pay | Admitting: Family Medicine

## 2020-10-14 NOTE — Telephone Encounter (Signed)
..   Medicaid Managed Care   Unsuccessful Outreach Note  10/14/2020 Name: Belinda [name] MRN: [id number] DOB: January 11, 1981  Referred by: Kathyrn Drown, MD Reason for referral : High Risk Managed Medicaid (I called the patient today to get her rescheduled for her initial phone visit with the [place]. I tried to leave a VM but it kept saying nothing was recorded. )   A second unsuccessful telephone outreach was attempted today. The patient was referred to the case management team for assistance with care management and care coordination.   Follow Up Plan: The care management team will reach out to the patient again over the next 7 days.   [place]

## 2020-10-17 ENCOUNTER — Ambulatory Visit (INDEPENDENT_AMBULATORY_CARE_PROVIDER_SITE_OTHER): Payer: Medicaid Other | Admitting: Family Medicine

## 2020-10-17 ENCOUNTER — Other Ambulatory Visit: Payer: Self-pay

## 2020-10-17 ENCOUNTER — Encounter: Payer: Self-pay | Admitting: Family Medicine

## 2020-10-17 DIAGNOSIS — R0609 Other forms of dyspnea: Secondary | ICD-10-CM

## 2020-10-17 DIAGNOSIS — F411 Generalized anxiety disorder: Secondary | ICD-10-CM | POA: Diagnosis not present

## 2020-10-17 DIAGNOSIS — R0600 Dyspnea, unspecified: Secondary | ICD-10-CM

## 2020-10-17 DIAGNOSIS — R269 Unspecified abnormalities of gait and mobility: Secondary | ICD-10-CM

## 2020-10-17 DIAGNOSIS — R29898 Other symptoms and signs involving the musculoskeletal system: Secondary | ICD-10-CM

## 2020-10-17 MED ORDER — ALPRAZOLAM 1 MG PO TABS: 1.0000 mg | ORAL_TABLET | Freq: Four times a day (QID) | ORAL | 4 refills | Status: DC | PRN

## 2020-10-17 NOTE — Progress Notes (Addendum)
   Subjective:    Patient ID: Belinda [name], female    DOB: [birth year], 40 y.o.   MRN: [id number]  HPI  Discuss upcoming MRI tests for back pain under care of dr Krista Blue MRI is coming back within the next couple weeks for now neurology is working with her at some point in time they will cut her loose to come back to slowly  Discuss gabapentin dose helpful for her she wants to continue, she would like to continue this for now  Discuss Weight gain stop the ensure she is trying to eat healthy but she has a difficult time because she is gaining weight it is certainly possible gabapentin is contributing to this as well as age and activity and genetics and poor diet  Lungs hurt? DOE she had severe COVID was on a respirator for a while now she states when she moves around she gets short of breath and she feels like her lungs hurt earlier this year had a CT scan that ruled out pulmonary embolus, she would benefit from seeing pulmonary to do a pulmonary function test.  I believe part of her problem is an activity  Applying for disability she is applying for disability because of her severe post COVID status.  It is not possible for her to work any job between her chronic anxiety, severe dyspnea status post COVID, her right leg and arm weakness related to severe COVID   Review of Systems     Objective:   Physical Exam Lungs are clear heart regular patient walks with a walker right leg weakness unsteady gait       Assessment & Plan:  1. Morbid obesity ([place]) Not sure if weight loss surgery covered by her insurance she was inquiring about it I have encouraged her to call her insurance to clarify and notify us  Also recommend consultation with nutritionist eat healthier and try to stay more active  2. DOE (dyspnea on exertion) I do not believe that this is her heart I believe it is deconditioning but there could also be lung damage from severe COVID to see pulmonary  3. Right leg weakness With  the right leg weakness from COVID physical therapy hopefully will be helpful for that and her abnormal gait  4. Abnormal gait Being evaluated with MRI under the care of neurology more than likely back with [place] physical therapy would be recommended  Severe anxiety related issues we will go ahead with the refills on her Xanax she has been on this for ages and is been very difficult to taper her down.  Down the road more than likely she will need to be under the care of psychiatry she has been resistant to this in the past  Follow-up mid December  Patient does have weakness with the leg It makes it very difficult for her to get around She has significant imbalance issues It is best for her to utilize a lightweight wheelchair within her house in order to get around and it would help her do her ADLs.

## 2020-10-21 ENCOUNTER — Telehealth: Payer: Self-pay | Admitting: Family Medicine

## 2020-10-21 NOTE — Telephone Encounter (Signed)
..   Medicaid Managed Care   Unsuccessful Outreach Note  10/21/2020 Name: MEGON KALINA MRN: [id number] DOB: [birth year]  Referred by: Kathyrn Drown, MD Reason for referral : High Risk Managed Medicaid (I called the patient today to reschedule her phone visit with the [facility] Team/RNCM. I left my name and number on her VM.)   Third unsuccessful telephone outreach was attempted today. The patient was referred to the case management team for assistance with care management and care coordination. The patient's primary care provider has been notified of our unsuccessful attempts to make or maintain contact with the patient. The care management team is pleased to engage with this patient at any time in the future should he/she be interested in assistance from the care management team.   Follow Up Plan: We have been unable to make contact with the patient for follow up. The care management team is available to follow up with the patient after provider conversation with the patient regarding recommendation for care management engagement and subsequent re-referral to the care management team.   [place], [place]

## 2020-11-03 ENCOUNTER — Encounter (HOSPITAL_COMMUNITY): Payer: Self-pay | Admitting: *Deleted

## 2020-11-03 ENCOUNTER — Other Ambulatory Visit: Payer: Self-pay

## 2020-11-03 NOTE — Progress Notes (Signed)
Spoke with pt for pre-op call. Pt denies cardiac history, HTN or diabetes. Pt had Covid December 2021 and was hospitalized. States she had stroke like symptoms, but did not have a stroke. Still weak, right worse than left.   Pt's surgery is scheduled as ambulatory so no Covid test is required prior to surgery.

## 2020-11-04 ENCOUNTER — Telehealth: Payer: Self-pay | Admitting: Neurology

## 2020-11-04 ENCOUNTER — Encounter (HOSPITAL_COMMUNITY): Admission: RE | Disposition: A | Discharge status: Home / Self Care | Payer: Self-pay | Source: Home / Self Care

## 2020-11-04 ENCOUNTER — Encounter (HOSPITAL_COMMUNITY): Payer: Self-pay

## 2020-11-04 ENCOUNTER — Ambulatory Visit (HOSPITAL_COMMUNITY): Admission: RE | Admit: 2020-11-04 | Payer: Medicaid Other | Source: Ambulatory Visit

## 2020-11-04 ENCOUNTER — Ambulatory Visit (HOSPITAL_COMMUNITY)
Admission: RE | Admit: 2020-11-04 | Discharge: 2020-11-04 | Disposition: A | Discharge status: Home / Self Care | Payer: Medicaid Other | Attending: Family Medicine | Admitting: Family Medicine

## 2020-11-04 ENCOUNTER — Ambulatory Visit (HOSPITAL_COMMUNITY): Payer: Medicaid Other | Admitting: Anesthesiology

## 2020-11-04 ENCOUNTER — Ambulatory Visit (HOSPITAL_COMMUNITY): Payer: Medicaid Other

## 2020-11-04 ENCOUNTER — Ambulatory Visit (HOSPITAL_COMMUNITY)
Admission: RE | Admit: 2020-11-04 | Discharge: 2020-11-04 | Disposition: A | Discharge status: Home / Self Care | Payer: Medicaid Other | Source: Ambulatory Visit | Attending: Neurology | Admitting: Neurology

## 2020-11-04 DIAGNOSIS — Z6841 Body Mass Index (BMI) 40.0 and over, adult: Secondary | ICD-10-CM | POA: Insufficient documentation

## 2020-11-04 DIAGNOSIS — R531 Weakness: Secondary | ICD-10-CM | POA: Diagnosis not present

## 2020-11-04 DIAGNOSIS — R2689 Other abnormalities of gait and mobility: Secondary | ICD-10-CM | POA: Insufficient documentation

## 2020-11-04 DIAGNOSIS — R269 Unspecified abnormalities of gait and mobility: Secondary | ICD-10-CM | POA: Diagnosis not present

## 2020-11-04 DIAGNOSIS — M549 Dorsalgia, unspecified: Secondary | ICD-10-CM | POA: Insufficient documentation

## 2020-11-04 DIAGNOSIS — K5900 Constipation, unspecified: Secondary | ICD-10-CM | POA: Insufficient documentation

## 2020-11-04 DIAGNOSIS — M47814 Spondylosis without myelopathy or radiculopathy, thoracic region: Secondary | ICD-10-CM | POA: Diagnosis not present

## 2020-11-04 DIAGNOSIS — M47812 Spondylosis without myelopathy or radiculopathy, cervical region: Secondary | ICD-10-CM | POA: Diagnosis not present

## 2020-11-04 DIAGNOSIS — G9341 Metabolic encephalopathy: Secondary | ICD-10-CM | POA: Diagnosis not present

## 2020-11-04 DIAGNOSIS — G9389 Other specified disorders of brain: Secondary | ICD-10-CM | POA: Diagnosis not present

## 2020-11-04 DIAGNOSIS — N281 Cyst of kidney, acquired: Secondary | ICD-10-CM | POA: Insufficient documentation

## 2020-11-04 DIAGNOSIS — R0600 Dyspnea, unspecified: Secondary | ICD-10-CM | POA: Diagnosis not present

## 2020-11-04 DIAGNOSIS — R32 Unspecified urinary incontinence: Secondary | ICD-10-CM | POA: Diagnosis not present

## 2020-11-04 DIAGNOSIS — M50223 Other cervical disc displacement at C6-C7 level: Secondary | ICD-10-CM | POA: Diagnosis not present

## 2020-11-04 DIAGNOSIS — R0602 Shortness of breath: Secondary | ICD-10-CM | POA: Diagnosis not present

## 2020-11-04 DIAGNOSIS — U099 Post covid-19 condition, unspecified: Secondary | ICD-10-CM | POA: Insufficient documentation

## 2020-11-04 DIAGNOSIS — E781 Pure hyperglyceridemia: Secondary | ICD-10-CM | POA: Diagnosis not present

## 2020-11-04 DIAGNOSIS — R29898 Other symptoms and signs involving the musculoskeletal system: Secondary | ICD-10-CM | POA: Diagnosis not present

## 2020-11-04 DIAGNOSIS — K219 Gastro-esophageal reflux disease without esophagitis: Secondary | ICD-10-CM | POA: Insufficient documentation

## 2020-11-04 DIAGNOSIS — Z87891 Personal history of nicotine dependence: Secondary | ICD-10-CM | POA: Diagnosis not present

## 2020-11-04 DIAGNOSIS — S3992XA Unspecified injury of lower back, initial encounter: Secondary | ICD-10-CM | POA: Diagnosis not present

## 2020-11-04 DIAGNOSIS — R270 Ataxia, unspecified: Secondary | ICD-10-CM | POA: Diagnosis not present

## 2020-11-04 HISTORY — PX: RADIOLOGY WITH ANESTHESIA: SHX6223

## 2020-11-04 HISTORY — DX: Calculus of gallbladder without cholecystitis without obstruction: K80.20

## 2020-11-04 HISTORY — DX: Myoneural disorder, unspecified: G70.9

## 2020-11-04 HISTORY — DX: COVID-19: U07.1

## 2020-11-04 HISTORY — DX: Anemia, unspecified: D64.9

## 2020-11-04 HISTORY — DX: Other complications of anesthesia, initial encounter: T88.59XA

## 2020-11-04 HISTORY — DX: Headache, unspecified: R51.9

## 2020-11-04 HISTORY — DX: Family history of other specified conditions: Z84.89

## 2020-11-04 HISTORY — DX: Pneumonia, unspecified organism: J18.9

## 2020-11-04 LAB — BASIC METABOLIC PANEL
Anion gap: 11 (ref 5–15)
BUN: 13 mg/dL (ref 6–20)
CO2: 23 mmol/L (ref 22–32)
Calcium: 9.2 mg/dL (ref 8.9–10.3)
Chloride: 102 mmol/L (ref 98–111)
Creatinine, Ser: 0.88 mg/dL (ref 0.44–1.00)
GFR, Estimated: >60 mL/min (ref >60)
Glucose, Bld: 98 mg/dL (ref 70–99)
Potassium: 3.9 mmol/L (ref 3.5–5.1)
Sodium: 136 mmol/L (ref 135–145)

## 2020-11-04 LAB — POCT PREGNANCY, URINE: Preg Test, Ur: NEGATIVE

## 2020-11-04 SURGERY — MRI WITH ANESTHESIA
Anesthesia: General

## 2020-11-04 MED ORDER — MIDAZOLAM HCL 2 MG/2ML IJ SOLN
INTRAMUSCULAR | Status: DC | PRN
  Administered 2020-11-04 (×2): 2 mg via INTRAVENOUS

## 2020-11-04 MED ORDER — ONDANSETRON HCL 4 MG/2ML IJ SOLN
INTRAMUSCULAR | Status: DC | PRN
  Administered 2020-11-04: 4 mg via INTRAVENOUS

## 2020-11-04 MED ORDER — LACTATED RINGERS IV SOLN: INTRAVENOUS | Status: DC | PRN

## 2020-11-04 MED ORDER — DEXAMETHASONE SODIUM PHOSPHATE 10 MG/ML IJ SOLN
INTRAMUSCULAR | Status: DC | PRN
  Administered 2020-11-04: 5 mg via INTRAVENOUS

## 2020-11-04 MED ORDER — ROCURONIUM BROMIDE 10 MG/ML (PF) SYRINGE
PREFILLED_SYRINGE | INTRAVENOUS | Status: DC | PRN
  Administered 2020-11-04: 60 mg via INTRAVENOUS
  Administered 2020-11-04: 40 mg via INTRAVENOUS

## 2020-11-04 MED ORDER — LIDOCAINE 2% (20 MG/ML) 5 ML SYRINGE
INTRAMUSCULAR | Status: DC | PRN
  Administered 2020-11-04: 60 mg via INTRAVENOUS

## 2020-11-04 MED ORDER — SUGAMMADEX SODIUM 200 MG/2ML IV SOLN
INTRAVENOUS | Status: DC | PRN
  Administered 2020-11-04: 400 mg via INTRAVENOUS

## 2020-11-04 MED ORDER — ORAL CARE MOUTH RINSE: 15.0000 mL | Freq: Once | OROMUCOSAL | Status: AC

## 2020-11-04 MED ORDER — CHLORHEXIDINE GLUCONATE 0.12 % MT SOLN
15.0000 mL | Freq: Once | OROMUCOSAL | Status: AC
  Administered 2020-11-04: 15 mL via OROMUCOSAL
  Filled 2020-11-04: qty 15

## 2020-11-04 MED ORDER — DEXMEDETOMIDINE (PRECEDEX) IN NS 20 MCG/5ML (4 MCG/ML) IV SYRINGE
PREFILLED_SYRINGE | INTRAVENOUS | Status: DC | PRN
  Administered 2020-11-04: 20 ug via INTRAVENOUS

## 2020-11-04 MED ORDER — PHENYLEPHRINE HCL-NACL 20-0.9 MG/250ML-% IV SOLN
INTRAVENOUS | Status: DC | PRN
  Administered 2020-11-04: 30 ug/min via INTRAVENOUS

## 2020-11-04 MED ORDER — PHENYLEPHRINE 40 MCG/ML (10ML) SYRINGE FOR IV PUSH (FOR BLOOD PRESSURE SUPPORT)
PREFILLED_SYRINGE | INTRAVENOUS | Status: DC | PRN
  Administered 2020-11-04: 120 ug via INTRAVENOUS
  Administered 2020-11-04: 80 ug via INTRAVENOUS
  Administered 2020-11-04: 120 ug via INTRAVENOUS

## 2020-11-04 MED ORDER — LACTATED RINGERS IV SOLN: INTRAVENOUS | Status: DC

## 2020-11-04 MED ORDER — FENTANYL CITRATE (PF) 100 MCG/2ML IJ SOLN
INTRAMUSCULAR | Status: DC | PRN
  Administered 2020-11-04: 50 ug via INTRAVENOUS

## 2020-11-04 MED ORDER — PROPOFOL 10 MG/ML IV BOLUS
INTRAVENOUS | Status: DC | PRN
  Administered 2020-11-04: 200 mg via INTRAVENOUS
  Administered 2020-11-04: 100 mg via INTRAVENOUS

## 2020-11-04 MED ORDER — GADOBUTROL 1 MMOL/ML IV SOLN
10.0000 mL | Freq: Once | INTRAVENOUS | Status: AC | PRN
  Administered 2020-11-04: 10 mL via INTRAVENOUS

## 2020-11-04 NOTE — Transfer of Care (Signed)
Immediate Anesthesia Transfer of Care Note  Patient: Belinda [name]  Procedure(s) Performed: MRI WITH ANESTHESIA BRAIN WITH AND [place] WITH AND [facility] WITHOUT CONTRAST,THORACIC WITH AND WITHOUT  Patient Location: PACU  Anesthesia Type:General  Level of Consciousness: drowsy, patient cooperative and responds to stimulation  Airway & Oxygen Therapy: Patient Spontanous Breathing and Patient connected to face mask oxygen  Post-op Assessment: Report given to RN and Post -op Vital signs reviewed and stable  Post vital signs: Reviewed and stable  Last Vitals:  Vitals Value Taken Time  BP    Temp    Pulse 85 11/04/20 1100  Resp 16 11/04/20 1100  SpO2 94 % 11/04/20 1100  Vitals shown include unvalidated device data.  Last Pain:  Vitals:   11/04/20 0721  TempSrc:   PainSc: 7       Patients Stated Pain Goal: 2 (14/83/07 3543)  Complications: No notable events documented.

## 2020-11-04 NOTE — Anesthesia Preprocedure Evaluation (Addendum)
Anesthesia Evaluation  Patient identified by MRN, date of birth, ID band Patient awake    Reviewed: Allergy & Precautions, NPO status , Patient's Chart, lab work & pertinent test results  Airway Mallampati: III  TM Distance: >3 FB Neck ROM: Full    Dental   Pulmonary asthma , former smoker,    breath sounds clear to auscultation       Cardiovascular negative cardio ROS   Rhythm:Regular Rate:Normal     Neuro/Psych  Neuromuscular disease    GI/Hepatic Neg liver ROS, GERD  ,  Endo/Other  Morbid obesity  Renal/GU negative Renal ROS     Musculoskeletal   Abdominal   Peds  Hematology negative hematology ROS (+)   Anesthesia Other Findings   Reproductive/Obstetrics                            Lab Results  Component Value Date   WBC 6.8 07/16/2020   HGB 13.0 07/16/2020   HCT 39.1 07/16/2020   MCV 86 07/16/2020   PLT 253 07/16/2020   Lab Results  Component Value Date   CREATININE 0.69 07/16/2020   BUN 20 07/16/2020   NA 142 07/16/2020   K 4.7 07/16/2020   CL 107 (H) 07/16/2020   CO2 22 07/16/2020    Anesthesia Physical Anesthesia Plan  ASA: 3  Anesthesia Plan: General   Post-op Pain Management:    Induction: Intravenous  PONV Risk Score and Plan: 3 and Ondansetron, Dexamethasone, Midazolam and Treatment may vary due to age or medical condition  Airway Management Planned: Oral ETT and LMA  Additional Equipment: None  Intra-op Plan:   Post-operative Plan: Extubation in OR  Informed Consent: I have reviewed the patients History and Physical, chart, labs and discussed the procedure including the risks, benefits and alternatives for the proposed anesthesia with the patient or authorized representative who has indicated his/her understanding and acceptance.     Dental advisory given  Plan Discussed with: CRNA  Anesthesia Plan Comments:         Anesthesia Quick  Evaluation

## 2020-11-04 NOTE — Anesthesia Procedure Notes (Signed)
Procedure Name: Intubation Date/Time: 11/04/2020 8:29 AM Performed by: Janace Litten, CRNA Pre-anesthesia Checklist: Patient identified, Emergency Drugs available, Suction available and Patient being monitored Patient Re-evaluated:Patient Re-evaluated prior to induction Oxygen Delivery Method: Circle System Utilized Preoxygenation: Pre-oxygenation with 100% oxygen Induction Type: IV induction Ventilation: Mask ventilation without difficulty Laryngoscope Size: Mac and 3 Grade View: Grade I Tube type: Oral Tube size: 7.0 mm Number of attempts: 1 Airway Equipment and Method: Stylet Placement Confirmation: ETT inserted through vocal cords under direct vision, positive ETCO2 and breath sounds checked- equal and bilateral Secured at: 22 cm Tube secured with: Tape Dental Injury: Teeth and Oropharynx as per pre-operative assessment

## 2020-11-04 NOTE — Anesthesia Postprocedure Evaluation (Signed)
Anesthesia Post Note  Patient: Belinda [name]  Procedure(s) Performed: MRI WITH ANESTHESIA BRAIN WITH AND [place] WITH AND [place] WITHOUT CONTRAST,THORACIC WITH AND WITHOUT     Patient location during evaluation: PACU Anesthesia Type: General Level of consciousness: awake and alert Pain management: pain level controlled Vital Signs Assessment: post-procedure vital signs reviewed and stable Respiratory status: spontaneous breathing, nonlabored ventilation, respiratory function stable and patient connected to nasal cannula oxygen Cardiovascular status: blood pressure returned to baseline and stable Postop Assessment: no apparent nausea or vomiting Anesthetic complications: no   No notable events documented.  Last Vitals:  Vitals:   11/04/20 1115 11/04/20 1125  BP: 99/66 93/66  Pulse: 93 88  Resp: 20 20  Temp:  36.7 C  SpO2: 93% 93%    Last Pain:  Vitals:   11/04/20 1125  TempSrc:   PainSc: 0-No pain                 Tiajuana Amass

## 2020-11-04 NOTE — Telephone Encounter (Signed)
I spoke to the patient. She verbalized understanding of the MRI findings. She is agreeable to move forward with the ENT referral and abdominal MRI. She is aware to expect a call for scheduling. She will contact her PCP for a follow up. She is also going to ask him to manage her gabapentin.

## 2020-11-04 NOTE — Telephone Encounter (Addendum)
IMPRESSION: No evidence of acute intracranial abnormality.   Redemonstrated small chronic focus of cortical encephalomalacia within the anterolateral right frontal lobe.   There are a few scattered nonspecific punctate foci of T2/FLAIR hyperintense signal abnormality within the bilateral cerebral white matter, which may have been obscured by motion artifact on the prior brain MRI of 02/25/2020.   2 x 10 mm T1 hyperintense lesion within the mid-to-posterior aspect of the pituitary gland, unchanged in size from the prior MRI. This lesion demonstrates hypoenhancement on postcontrast imaging. 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 However, a cystic pituitary adenoma cannot be excluded. Correlation with relevant endocrinologic laboratory values again recommended.   Severe right maxillary sinusitis.   Please call patient, MRI of the brain showed no acute intracranial abnormality, evidence of right maxillary sinusitis, will refer her to ENT  Small cortical scar at the right anterior lateral frontal lobe, no acute abnormalities   MRI of the spine showed no significant abnormality, but on MRI of lumbar spine, there is incidental finding of T2 round hypodensity focus of signal abnormality near left renal healing, measuring at least 2.3 cm, radiology has suggested further evaluation with abdominal MRI contrast-enhancing renal protocol, I have forwarded the result to her primary care Luking, Elayne Snare, MD, also placed order  Orders Placed This Encounter  Procedures   MR ABDOMEN [place]   Ambulatory referral to ENT

## 2020-11-04 NOTE — Addendum Note (Signed)
Addended by: Marcial Pacas on: 11/04/2020 03:33 PM   Modules accepted: Orders

## 2020-11-05 ENCOUNTER — Telehealth: Payer: Self-pay | Admitting: Neurology

## 2020-11-05 ENCOUNTER — Telehealth: Payer: Self-pay | Admitting: Family Medicine

## 2020-11-05 ENCOUNTER — Encounter (HOSPITAL_COMMUNITY): Payer: Self-pay | Admitting: Radiology

## 2020-11-05 NOTE — Telephone Encounter (Signed)
Please let the patient know that I am aware of her MRI results The neurologist is setting her up for MRI of the abdomen to look at the kidney which is a good idea I would like for the patient to do a follow-up visit with [place] after that MRI  Once the patient knows when her abdominal MRI is it is important for the patient to notify us of that date so that we can help set her up for a follow-up visit with me within several days of that MRI so we can discuss results thank you

## 2020-11-05 NOTE — Telephone Encounter (Signed)
[facility] ph # [phone].

## 2020-11-06 ENCOUNTER — Telehealth: Payer: Self-pay | Admitting: Neurology

## 2020-11-06 DIAGNOSIS — N281 Cyst of kidney, acquired: Secondary | ICD-10-CM

## 2020-11-06 NOTE — Telephone Encounter (Signed)
Please call patient, to see if she can tolerate MRI abdomen w/wo with xanax, if she can, we will not do general anesthesia,   If not, I will change to CT abdomen w/wo

## 2020-11-06 NOTE — Telephone Encounter (Signed)
IS the MRI Abdomen going to be general sedation as well as the other ones? If so I will need a new order put in for general sedation for [facility]'s cone.

## 2020-11-06 NOTE — Telephone Encounter (Signed)
Pt contacted and verbalized understanding. Pt will call after MRI is set up and set up follow up appt with provider.

## 2020-11-07 ENCOUNTER — Other Ambulatory Visit: Payer: Self-pay | Admitting: Family Medicine

## 2020-11-12 NOTE — Telephone Encounter (Signed)
Noted, I left the patient to call me back.

## 2020-11-12 NOTE — Telephone Encounter (Signed)
Mcd healthy blue pending uploaded notes on the portal.

## 2020-11-12 NOTE — Telephone Encounter (Signed)
Patient returned my call she stated since she had such a awful time when she had Covid and was in the hospital on a ventilator and a feeding tube, she has PTSD from all of that. She would prefer to have the MRI under general anesthesia.  Can you put a new order in?

## 2020-11-12 NOTE — Telephone Encounter (Signed)
We will certainly go ahead with connecting with her and working this up thank you so much

## 2020-11-12 NOTE — Telephone Encounter (Signed)
Dr. Wolfgang Phoenix, Patient has severe claustrophobia, she would have to require general anesthesia for MRI of abdomen with without contrast,  I have canceled my previous order, would prefer you to continue further evaluation of her right renal hilum mass.   Thank you.

## 2020-11-12 NOTE — Addendum Note (Signed)
Addended by: Marcial Pacas on: 11/12/2020 03:37 PM   Modules accepted: Orders

## 2020-11-14 DIAGNOSIS — Z029 Encounter for administrative examinations, unspecified: Secondary | ICD-10-CM

## 2020-11-17 ENCOUNTER — Telehealth: Payer: Self-pay | Admitting: Family Medicine

## 2020-11-17 NOTE — Telephone Encounter (Signed)
Neurologist wants [place] to handle this issue.  Please make sure patient is aware.  If possible please give patient appointment slot within the next 2 weeks, may utilize 1120 if necessary thank you

## 2020-11-17 NOTE — Telephone Encounter (Signed)
Pt contacted. Pt states she would like a new wheelchair. Pt had script from [place] but was able to borrow a wheelchair from a friend. When pt went to [place], she inquired about the wheelchair and they told her she would need the script. Pt no longer had script from [facility] written out and placed on provider door for signature. Please advise. Thank you  Appt moved up to 12/01/20 at 11:20

## 2020-11-17 NOTE — Telephone Encounter (Signed)
Mcd healthy blue Josem Kaufmann: [id number] (exp. 11/12/20 to 01/11/21)  Can you put a new order in for general anesthesia for Belinda [name]' cone.

## 2020-11-17 NOTE — Telephone Encounter (Signed)
I have finished the prescription thank you it is on the door   please have the front schedule her in October to discuss renal growth and discuss setting her up for further evaluation

## 2020-11-17 NOTE — Telephone Encounter (Signed)
Patient stated she was given a prescription for a wheelchair a couple of months ago but did not fill it because she borrowed one from a friend. That wheelchair is not working out and she is requesting a new prescription. [facility] stated they needed notes on why the chair is needed as well as the prescription.   She also called about an MRI and is scheduled with Dr. Nicki Reaper on 10/19 to discuss  CB# [phone]

## 2020-11-17 NOTE — Telephone Encounter (Signed)
Prescription filled out We will discuss the renal growth issue when she follows up

## 2020-11-17 NOTE — Telephone Encounter (Signed)
Pt contacted and verbalized understanding. Pt appt has been moved up to 12/01/20 at 11:20 am.

## 2020-11-17 NOTE — Telephone Encounter (Signed)
Walker script sent to [facility]. Pt verbalized understanding.   Pt spoke with neuro last Thursday; pt needing to be put to sleep for MRI and MRI has not been schedule at this time. Pt is going to call neurologist tomorrow to check on status of MRI being scheduled. Informed pt to call us back with information so we could stay in the loop.

## 2020-11-17 NOTE — Addendum Note (Signed)
Addended by: Lester Travilah A on: 11/17/2020 08:43 AM   Modules accepted: Orders

## 2020-11-17 NOTE — Telephone Encounter (Signed)
Pt would like walker to go to [facility] instead of [facility]. Script written out and placed on provider door. Please advise. Thank you.

## 2020-11-17 NOTE — Telephone Encounter (Signed)
Patient is need a prescription for hand-walker called into [facility] and she wants it delivered.

## 2020-11-18 NOTE — Telephone Encounter (Signed)
Noted, thank you

## 2020-11-18 NOTE — Telephone Encounter (Signed)
Script faxed to [facility] and pt is aware. Pt verbalized understanding.

## 2020-11-22 ENCOUNTER — Telehealth: Payer: Self-pay | Admitting: Family Medicine

## 2020-11-22 NOTE — Telephone Encounter (Signed)
Nurses Please see form with pink notations Please note the following We tried to get the patient a wheelchair  Under current guidelines wheelchairs are only issued if the patient is needing those at home.  So find out from the patient does she need a wheelchair at home or only when she goes out.  If she states that a wheelchair at home would allow her to complete her mobility activities of daily living then that is something that potentially she would qualify for.

## 2020-11-25 ENCOUNTER — Ambulatory Visit: Payer: Medicaid Other | Admitting: Nutrition

## 2020-11-25 NOTE — Telephone Encounter (Signed)
Left message to return call 

## 2020-11-27 DIAGNOSIS — Z0271 Encounter for disability determination: Secondary | ICD-10-CM

## 2020-11-27 NOTE — Telephone Encounter (Signed)
Left message to return call 

## 2020-11-28 NOTE — Telephone Encounter (Signed)
Pt using wheelchair to go long distances (grocery store, sons football game).  Not so much in the house. Pt uses walker with seat in home. Uses cane/walker also.  Pt would like newer and not so heavy wheelchair.  Pt uses seated walker to do daily activities.  Pt unable to use recent chair due to "huge bulky size". Pt will use wheelchair at home once she gets a new one.  Pt has wheelchair accessible apartment.  Please advise. Thank you

## 2020-11-30 NOTE — Telephone Encounter (Signed)
This is to document that the patient utilizes a wheelchair to help her with ADLs Please write out prescription for lightweight wheelchair for home use I will fill in additional information And then we can fax it Unfortunately there is no guarantee this will be covered we will do the best we can and that is all we can do Thanks-Dr. Nicki Reaper

## 2020-12-01 ENCOUNTER — Ambulatory Visit: Payer: Medicaid Other | Admitting: Family Medicine

## 2020-12-01 NOTE — Telephone Encounter (Signed)
Not sure why it was routed back to me?

## 2020-12-01 NOTE — Telephone Encounter (Signed)
Completed thank you, have front send a copy of her last office visit note with this thank you

## 2020-12-03 ENCOUNTER — Ambulatory Visit: Payer: Medicaid Other | Admitting: Family Medicine

## 2020-12-03 ENCOUNTER — Other Ambulatory Visit: Payer: Self-pay

## 2020-12-03 ENCOUNTER — Encounter: Payer: Self-pay | Admitting: Family Medicine

## 2020-12-03 ENCOUNTER — Other Ambulatory Visit: Payer: Self-pay | Admitting: Family Medicine

## 2020-12-03 ENCOUNTER — Telehealth: Payer: Self-pay | Admitting: Family Medicine

## 2020-12-03 VITALS — BP 129/81 | HR 79

## 2020-12-03 DIAGNOSIS — F4310 Post-traumatic stress disorder, unspecified: Secondary | ICD-10-CM

## 2020-12-03 DIAGNOSIS — Z23 Encounter for immunization: Secondary | ICD-10-CM | POA: Diagnosis not present

## 2020-12-03 DIAGNOSIS — N2889 Other specified disorders of kidney and ureter: Secondary | ICD-10-CM | POA: Diagnosis not present

## 2020-12-03 DIAGNOSIS — N281 Cyst of kidney, acquired: Secondary | ICD-10-CM

## 2020-12-03 DIAGNOSIS — F411 Generalized anxiety disorder: Secondary | ICD-10-CM

## 2020-12-03 MED ORDER — KETOCONAZOLE 2 % EX CREA: 1.0000 "application " | TOPICAL_CREAM | Freq: Two times a day (BID) | CUTANEOUS | 4 refills | Status: DC

## 2020-12-03 NOTE — Telephone Encounter (Signed)
This was completed as requested

## 2020-12-03 NOTE — Progress Notes (Signed)
   Subjective:    Patient ID: Belinda [name], female    DOB: 04-11-1980, 40 y.o.   MRN: [id number]  HPI Pt here to discuss MRI. Pt neurology would like PCP to order MRI. MRI abdomen to look at renal mass.   Pt episodes with forgetting things is getting worse. Yesterday pt was putting milk in coffee and continued to pour even though coffee was spilling out of coffee.  Pt needs note saying that she is to continue being outside at methadone clinic except for urine/drug screen(twice month) and meeting with counselor (once or twice weekly or bi weekly)  Review of Systems     Objective:   Physical Exam General-in no acute distress Eyes-no discharge Lungs-respiratory rate normal, CTA CV-no murmurs,RRR Extremities skin warm dry no edema Neuro grossly normal Behavior normal, alert   She is at risk of getting further infections.  She requested a letter so that she can get her daily opioid at the clinic outside.  A letter was given.     Assessment & Plan:  1. GAD (generalized anxiety disorder) Patient takes on average 4 Xanax's per day she has been doing this for as long as I am have known her.  We have tried to get her in with psychiatry multiple different times but the patient has been resistant to do so.  We have also tried numerous antidepressant medicines without much success.  She relates that she is open to seeing a psychiatrist once her renal situation has calm down   2. Renal mass This needs further looking into.  When she tried to do her MRI in [place] she had to be sedated in order to have the test done.  So therefore we will pursue MRI with contrast renal protocol with sedation - MR ABDOMEN W CONTRAST - Ambulatory referral to Urology  3. PTSD (post-traumatic stress disorder) I recommend psychiatry referral patient states she will do so in the near future we will bring this up on the next visit  4. Other specified disorders of kidney and ureter See above - MR ABDOMEN W  CONTRAST  5. Need for vaccination Today - Flu Vaccine QUAD 6+ mos PF IM (Fluarix Quad PF)

## 2020-12-03 NOTE — Telephone Encounter (Signed)
Pt in for office visit today and wanted to make sure anti fungal cream would be sent to pharmacy for rash under breast area. Please advise. Thank you

## 2020-12-08 ENCOUNTER — Other Ambulatory Visit: Payer: Self-pay | Admitting: Family Medicine

## 2020-12-08 ENCOUNTER — Other Ambulatory Visit: Payer: Self-pay | Admitting: Gastroenterology

## 2020-12-09 DIAGNOSIS — R531 Weakness: Secondary | ICD-10-CM | POA: Diagnosis not present

## 2020-12-09 DIAGNOSIS — G119 Hereditary ataxia, unspecified: Secondary | ICD-10-CM | POA: Diagnosis not present

## 2020-12-09 NOTE — Addendum Note (Signed)
Addended by: Vicente Males on: 12/09/2020 04:26 PM   Modules accepted: Orders

## 2020-12-10 ENCOUNTER — Ambulatory Visit: Payer: Medicaid Other | Admitting: Family Medicine

## 2020-12-10 NOTE — Telephone Encounter (Signed)
May have 6 months on all

## 2020-12-11 ENCOUNTER — Encounter: Payer: Self-pay | Admitting: *Deleted

## 2020-12-15 ENCOUNTER — Telehealth: Payer: Self-pay | Admitting: Family Medicine

## 2020-12-15 NOTE — Telephone Encounter (Signed)
Hi Belinda [name] I filled out the form as requested.  Please make sure to clarify are they doing this procedure at [facility] long?  Given that the patient has history of claustrophobia, history of chronic anxiety, history of chronic opioid substance use disorder she is on Xanax 1 g 4 times daily as well as Subutex-I have indicated this on the form but it is very important that they are aware of this so that they will take that into consideration when deciding sedation etc. for the patient. Her severe claustrophobia prevents doing a standard MRI procedure with her-I agree with putting her to sleep but she poses some interesting challenges that they should be aware of.  When they review over the form they should have a fairly decent appreciation for this.  Thanks-Dr. Nicki Reaper

## 2020-12-15 NOTE — Telephone Encounter (Signed)
Form in provider office. Please advise. Thank you

## 2020-12-15 NOTE — Telephone Encounter (Signed)
Radiology from [place] contacted office and states Belinda [name] does not do anti anxiety; they would need to put patient to sleep. Radiologist states she has changed the order, she just needs PCP to sing the order. Radiology will also be faxing over papers for PCP to sign. Radiology will schedule pt once all papers completed.

## 2020-12-16 NOTE — Telephone Encounter (Signed)
Form faxed to number on form .

## 2020-12-18 ENCOUNTER — Telehealth: Payer: Self-pay | Admitting: Family Medicine

## 2020-12-18 NOTE — Telephone Encounter (Signed)
Patient states she needs medical documentation stating that her boyfriend, Camelia Phenes is caring for her out of medical necessity so he can be added to her lease. A letter is needed for [facility]. Please advise   [phone]

## 2020-12-21 ENCOUNTER — Encounter: Payer: Self-pay | Admitting: Family Medicine

## 2020-12-21 NOTE — Telephone Encounter (Signed)
A letter was dictated regarding this, please make sure copy is printed and given to the patient

## 2020-12-25 ENCOUNTER — Telehealth: Payer: Self-pay | Admitting: Family Medicine

## 2020-12-25 NOTE — Telephone Encounter (Signed)
She may be offered a slot tomorrow at 1020 or 1140 if available otherwise urgent care  Not quite sure what she is referring to regarding CAT scan?  Possibly MRI based on the chart?

## 2020-12-25 NOTE — Telephone Encounter (Signed)
Patient states she has the flu and has been taking over the counter medication but wants something called in tried to explain would need appointment before any medication is called in and was referred to Urgent Care. She refused it wanting to speak with nurse again explained I would put message in a send back. She states went to football game last Friday to watch her son and now the whole team has the flu and her whole family has the flu but only she is a patient here. Refusing urgent care or ER. She wants to know about her CT scan alsoPlease advise

## 2020-12-25 NOTE — Telephone Encounter (Signed)
Patient scheduled appt 12/26/20 at 10:20am Patient also given date and time for MRI and verbalized understanding.

## 2020-12-26 ENCOUNTER — Ambulatory Visit (INDEPENDENT_AMBULATORY_CARE_PROVIDER_SITE_OTHER): Payer: Medicaid Other | Admitting: Family Medicine

## 2020-12-26 ENCOUNTER — Other Ambulatory Visit: Payer: Self-pay

## 2020-12-26 DIAGNOSIS — R52 Pain, unspecified: Secondary | ICD-10-CM

## 2020-12-26 DIAGNOSIS — B349 Viral infection, unspecified: Secondary | ICD-10-CM

## 2020-12-26 MED ORDER — OSELTAMIVIR PHOSPHATE 75 MG PO CAPS: 75.0000 mg | ORAL_CAPSULE | Freq: Two times a day (BID) | ORAL | 0 refills | Status: DC

## 2020-12-26 MED ORDER — ONDANSETRON HCL 8 MG PO TABS: 8.0000 mg | ORAL_TABLET | Freq: Three times a day (TID) | ORAL | 2 refills | Status: DC | PRN

## 2020-12-26 NOTE — Progress Notes (Signed)
   Subjective:    Patient ID: Belinda [name], female    DOB: 11-26-1980, 40 y.o.   MRN: [id number]  HPI Patient is here with c/o flu like symptoms; fevers, wheezing, sob, body aches, cough, sore throat, nausea, congestion- taking tylenol, ibuprofen, delsyum Drinking , but little appetite Recent exposure to family members with similar symptoms Negative home covid test  Review of Systems     Objective:   Physical Exam  Lungs clear heart regular HEENT benign is set for runny nose and cough      Assessment & Plan:   Viral syndrome Triple swab taken Supportive measures discussed Rest up at home Warning signs discussed

## 2020-12-28 LAB — COVID-19, FLU A+B AND RSV
Influenza A, NAA: DETECTED — AB
Influenza B, NAA: NOT DETECTED
RSV, NAA: NOT DETECTED
SARS-CoV-2, NAA: NOT DETECTED

## 2020-12-28 LAB — SPECIMEN STATUS REPORT

## 2020-12-29 ENCOUNTER — Telehealth: Payer: Self-pay | Admitting: Family Medicine

## 2020-12-29 NOTE — Telephone Encounter (Signed)
Please advise. Thank you

## 2020-12-29 NOTE — Telephone Encounter (Signed)
Authorization needed for MRI, abdominal w/o contrast for 11/10 for [employer]. Call [facility]?? [phone]  ext [phone]

## 2020-12-30 ENCOUNTER — Telehealth: Payer: Self-pay | Admitting: Family Medicine

## 2020-12-30 ENCOUNTER — Other Ambulatory Visit: Payer: Self-pay | Admitting: Family Medicine

## 2020-12-30 MED ORDER — AZITHROMYCIN 250 MG PO TABS: ORAL_TABLET | ORAL | 0 refills | Status: AC

## 2020-12-30 NOTE — Telephone Encounter (Signed)
Azithromycin 5 days was sent in.  I would recommend if the patient is having any significant shortness of breath to be seen ASAP otherwise we could see her Friday for recheck.  We can even do a car visit if that is easier for her.

## 2020-12-30 NOTE — Telephone Encounter (Signed)
Patient inquired about swab tet from last visit. She stated she is not getting any better and was told to let Dr. Nicki Reaper know.  Please advise.  CB# [phone]

## 2020-12-30 NOTE — Telephone Encounter (Signed)
Results discussed with patient.-see result note Patient states she finished the Tamiflu today and is not feeling any better- about the same as when she was seen last week- Please advise

## 2020-12-31 ENCOUNTER — Telehealth: Payer: Self-pay | Admitting: Family Medicine

## 2020-12-31 NOTE — Telephone Encounter (Signed)
Left message to return call 

## 2020-12-31 NOTE — Telephone Encounter (Signed)
Error please close

## 2020-12-31 NOTE — Telephone Encounter (Signed)
Patient notified and stated she is going to start the z pack and see if it helps- if still feeling bad tomorrow will call back for a recheck on Friday and go to ER if worse.

## 2021-01-01 ENCOUNTER — Ambulatory Visit: Admit: 2021-01-01 | Payer: Medicaid Other

## 2021-01-01 ENCOUNTER — Ambulatory Visit (HOSPITAL_COMMUNITY): Admission: RE | Admit: 2021-01-01 | Payer: Medicaid Other | Source: Ambulatory Visit

## 2021-01-01 ENCOUNTER — Encounter (HOSPITAL_COMMUNITY): Payer: Self-pay

## 2021-01-01 SURGERY — MRI WITH ANESTHESIA
Anesthesia: General

## 2021-01-02 ENCOUNTER — Ambulatory Visit (HOSPITAL_COMMUNITY)
Admission: RE | Admit: 2021-01-02 | Discharge: 2021-01-02 | Disposition: A | Discharge status: Home / Self Care | Payer: Medicaid Other | Source: Ambulatory Visit | Attending: Family Medicine | Admitting: Family Medicine

## 2021-01-02 ENCOUNTER — Other Ambulatory Visit: Payer: Self-pay

## 2021-01-02 ENCOUNTER — Telehealth: Payer: Self-pay | Admitting: Family Medicine

## 2021-01-02 ENCOUNTER — Ambulatory Visit (INDEPENDENT_AMBULATORY_CARE_PROVIDER_SITE_OTHER): Payer: Medicaid Other | Admitting: Family Medicine

## 2021-01-02 DIAGNOSIS — M7989 Other specified soft tissue disorders: Secondary | ICD-10-CM

## 2021-01-02 DIAGNOSIS — J019 Acute sinusitis, unspecified: Secondary | ICD-10-CM | POA: Diagnosis not present

## 2021-01-02 DIAGNOSIS — R34 Anuria and oliguria: Secondary | ICD-10-CM

## 2021-01-02 MED ORDER — CEFDINIR 300 MG PO CAPS: 300.0000 mg | ORAL_CAPSULE | Freq: Two times a day (BID) | ORAL | 0 refills | Status: DC

## 2021-01-02 NOTE — Telephone Encounter (Signed)
Due to decreased urination  Please please order metabolic 7 Patient is aware She will have this drawn on Monday

## 2021-01-02 NOTE — Progress Notes (Signed)
   Subjective:    Patient ID: Belinda [name], female    DOB: May 27, 1980, 40 y.o.   MRN: [id number]  HPI  Patient arrives for a follow up on flu. Patient doing follow-up from the flu She states she just had not felt good and because of it she has been drinking some fluids but not urinating as much is normal she states her energy level is not quite as good as what it should be she also relates her right leg seems to be swollen then she complains of pain in the calf she has a history of severe COVID and respiratory distress and has been laid up for the past several days because of the flu and has a chronically weak right leg Review of Systems     Objective:   Physical Exam  No crackles respiratory rate normal heart regular she does have tenderness in the right calf with some swelling left calf nontender      Assessment & Plan:  Could be venous insufficiency but could also be a blood clot recommend ultrasound await the results of this  Flu gradually easing up continue current measures

## 2021-01-05 ENCOUNTER — Ambulatory Visit: Payer: Medicaid Other | Admitting: Urology

## 2021-01-05 NOTE — Telephone Encounter (Signed)
Blood work ordered in [employer]

## 2021-01-08 ENCOUNTER — Ambulatory Visit (HOSPITAL_COMMUNITY): Payer: Medicaid Other

## 2021-01-13 ENCOUNTER — Telehealth: Payer: Self-pay | Admitting: Family Medicine

## 2021-01-13 ENCOUNTER — Encounter: Payer: Self-pay | Admitting: Family Medicine

## 2021-01-13 NOTE — Telephone Encounter (Signed)
Patient states she has a letter renewed every month stating that she needs all of her medical appointments to be done outside??? She is requesting a new letter be written to be picked up next week if possible.

## 2021-01-13 NOTE — Telephone Encounter (Signed)
Please let patient know that we did a letter for her, have the front print it for her, as for long-term I believe once we get through the wintertime into next spring it would be safer to be able to go inside for medical visits thank you

## 2021-01-14 NOTE — Telephone Encounter (Signed)
Please print letter for patient and let her know it is ready. Thank you!!

## 2021-01-22 ENCOUNTER — Telehealth: Payer: Self-pay | Admitting: Family Medicine

## 2021-01-22 NOTE — Telephone Encounter (Signed)
Spoke with central scheduling , they state they will need a new H&P in order to proceed with scheduling MRI, they are faxing a new form .

## 2021-01-22 NOTE — Telephone Encounter (Signed)
Patient called in stating she needed a new referral for MRI of kidney with sedation. She also states that they need her covid results sent.  CB# [phone]

## 2021-01-28 ENCOUNTER — Other Ambulatory Visit: Payer: Self-pay | Admitting: Neurology

## 2021-01-29 ENCOUNTER — Other Ambulatory Visit: Payer: Self-pay | Admitting: Family Medicine

## 2021-02-01 ENCOUNTER — Other Ambulatory Visit: Payer: Self-pay | Admitting: Family Medicine

## 2021-02-06 ENCOUNTER — Other Ambulatory Visit: Payer: Self-pay | Admitting: Family Medicine

## 2021-02-07 ENCOUNTER — Other Ambulatory Visit: Payer: Self-pay | Admitting: Family Medicine

## 2021-02-09 ENCOUNTER — Other Ambulatory Visit: Payer: Self-pay

## 2021-02-09 ENCOUNTER — Emergency Department (HOSPITAL_COMMUNITY): Payer: Medicaid Other

## 2021-02-09 ENCOUNTER — Emergency Department (HOSPITAL_COMMUNITY)
Admission: EM | Admit: 2021-02-09 | Discharge: 2021-02-09 | Disposition: A | Discharge status: Home / Self Care | Payer: Medicaid Other | Source: Home / Self Care

## 2021-02-09 ENCOUNTER — Emergency Department (HOSPITAL_COMMUNITY)
Admission: EM | Admit: 2021-02-09 | Discharge: 2021-02-10 | Disposition: A | Discharge status: Home / Self Care | Payer: Medicaid Other | Attending: Emergency Medicine | Admitting: Emergency Medicine

## 2021-02-09 ENCOUNTER — Telehealth: Payer: Self-pay | Admitting: Internal Medicine

## 2021-02-09 ENCOUNTER — Telehealth: Payer: Self-pay

## 2021-02-09 DIAGNOSIS — R079 Chest pain, unspecified: Secondary | ICD-10-CM | POA: Insufficient documentation

## 2021-02-09 DIAGNOSIS — Z5321 Procedure and treatment not carried out due to patient leaving prior to being seen by health care provider: Secondary | ICD-10-CM | POA: Insufficient documentation

## 2021-02-09 LAB — CBC WITH DIFFERENTIAL/PLATELET
Abs Immature Granulocytes: 0.05 10*3/uL (ref 0.00–0.07)
Basophils Absolute: 0 10*3/uL (ref 0.0–0.1)
Basophils Relative: 0 %
Eosinophils Absolute: 0 10*3/uL (ref 0.0–0.5)
Eosinophils Relative: 0 %
HCT: 41.3 % (ref 36.0–46.0)
Hemoglobin: 13.8 g/dL (ref 12.0–15.0)
Immature Granulocytes: 1 %
Lymphocytes Relative: 17 %
Lymphs Abs: 1.7 10*3/uL (ref 0.7–4.0)
MCH: 29.4 pg (ref 26.0–34.0)
MCHC: 33.4 g/dL (ref 30.0–36.0)
MCV: 88.1 fL (ref 80.0–100.0)
Monocytes Absolute: 0.8 10*3/uL (ref 0.1–1.0)
Monocytes Relative: 8 %
Neutro Abs: 7.2 10*3/uL (ref 1.7–7.7)
Neutrophils Relative %: 74 %
Platelets: 242 10*3/uL (ref 150–400)
RBC: 4.69 MIL/uL (ref 3.87–5.11)
RDW: 14.3 % (ref 11.5–15.5)
WBC: 9.8 10*3/uL (ref 4.0–10.5)
nRBC: 0 % (ref 0.0–0.2)

## 2021-02-09 LAB — URINALYSIS, ROUTINE W REFLEX MICROSCOPIC
Glucose, UA: NEGATIVE mg/dL
Hgb urine dipstick: NEGATIVE
Ketones, ur: NEGATIVE mg/dL
Leukocytes,Ua: NEGATIVE
Nitrite: NEGATIVE
Protein, ur: NEGATIVE mg/dL
Specific Gravity, Urine: 1.02 (ref 1.005–1.030)
pH: 5.5 (ref 5.0–8.0)

## 2021-02-09 LAB — I-STAT BETA HCG BLOOD, ED (MC, WL, AP ONLY): I-stat hCG, quantitative: <5 m[IU]/mL (ref <5)

## 2021-02-09 LAB — TROPONIN I (HIGH SENSITIVITY): Troponin I (High Sensitivity): 4 ng/L (ref <18)

## 2021-02-09 NOTE — Telephone Encounter (Signed)
Attempted PA again did not go through. Checked on [place] [employer] and it stated pt does not have coverage. Phoned pt back and LMOVM regarding her insurance and to call

## 2021-02-09 NOTE — Telephone Encounter (Signed)
PA  done for pt for generic dexilant 65m. Dx: k21.9. waiting on a response

## 2021-02-09 NOTE — Telephone Encounter (Signed)
ERROR

## 2021-02-09 NOTE — ED Provider Notes (Signed)
Emergency Medicine Provider Triage Evaluation Note  Belinda [name] , a 40 y.o. female  was evaluated in triage.  Pt complains of chest pain.  History of gallbladder disease but prior surgery was delayed due to [place].  She has not had any recent follow-up with general surgery.  Today started having chest and upper abdominal pain along with nausea and vomiting.  Denies any known cardiac history but father had MI.  Review of Systems  Positive: Chest pain, abdominal pain Negative: fever  Physical Exam  BP 126/86 (BP Location: Left Arm)    Pulse (!) 104    Temp 98.8 F (37.1 C) (Oral)    Resp 17    SpO2 96%   Gen:   Awake, no distress Resp:  Normal effort  MSK:   Moves extremities without difficulty  Other:  No abdominal tenderness noted  Medical Decision Making  Medically screening exam initiated at 10:26 PM.  Appropriate orders placed.  RIOT BARRICK was informed that the remainder of the evaluation will be completed by another provider, this initial triage assessment does not replace that evaluation, and the importance of remaining in the ED until their evaluation is complete.  Chest pain, upper abdominal pain.  Hx gallstones, no recent follow-up.  EKG, labs, CXR, RUQ [place].   Larene Pickett, PA-C 02/09/21 2227    Isla Pence, MD 02/09/21 [phone]

## 2021-02-09 NOTE — Telephone Encounter (Signed)
Pt's last OV was 10/2019. EGD was done 07/2018. Does she need OV?

## 2021-02-09 NOTE — Telephone Encounter (Signed)
Please call patient, she said we told her she had gallstones after her endo and she needs to be referred to a surgeon.  [phone]

## 2021-02-09 NOTE — Telephone Encounter (Signed)
Dr. Wolfgang Phoenix ordered her gb ultrasound and HIDA in 2020. We did EGD after and requested she follow up with change in PPI to [place] and plans to refer for consideration in getting her gallbladder out if no improvement. Unfortunately she no showed the follow up appointment. Last seen in 10/2019 for constipation and GERD with no symptoms suggestive of gallbladder disease at that time.   I would recommend she request referral from Dr. Wolfgang Phoenix since he has recently seen her. If we make referral, we will need to update OV first since her last ov was over a year ago. We would be glad to see her for follow up for her GERD and constipation.

## 2021-02-09 NOTE — ED Triage Notes (Signed)
Pt reported to ED with multiple including chest pain and hx of gallbladder issues, making it difficult to distinguish primary complaint. States she was waiting to be seen at [facility] and was vomiting profusely and could not take deep breaths. Pt appears to have no apparent distress at time of triage.

## 2021-02-09 NOTE — Telephone Encounter (Signed)
Lmom for pt to return my call. According to chart Dr. Wolfgang Phoenix informed her about gallstones on US abdomen that was done on 03/31/2018. Didn't see any notes regarding gen surg consult.

## 2021-02-10 ENCOUNTER — Telehealth: Payer: Self-pay

## 2021-02-10 ENCOUNTER — Telehealth: Payer: Self-pay | Admitting: Family Medicine

## 2021-02-10 DIAGNOSIS — K8020 Calculus of gallbladder without cholecystitis without obstruction: Secondary | ICD-10-CM

## 2021-02-10 LAB — TROPONIN I (HIGH SENSITIVITY): Troponin I (High Sensitivity): 4 ng/L (ref <18)

## 2021-02-10 LAB — LIPASE, BLOOD: Lipase: 1244 U/L — ABNORMAL HIGH (ref 11–51)

## 2021-02-10 NOTE — Telephone Encounter (Signed)
Referral ordered in [facility]( Patient wanted to use [facility])  Patient notified.

## 2021-02-10 NOTE — Telephone Encounter (Signed)
Lmom for pt to return my call.  

## 2021-02-10 NOTE — Telephone Encounter (Signed)
Patient called this morning to let you know that she went to [facility] yesterday for chest pain but didn't stay ,she left before being seen and went to [facility] at 11:04 last night . [facility] did some scans and test on patient but she left after nine hours of being there and didn't get results. She thinks its her gall bladder again. She didn't stay to get a diagnosis.

## 2021-02-10 NOTE — Telephone Encounter (Signed)
Pt was made aware and verbalized understanding.

## 2021-02-10 NOTE — Telephone Encounter (Signed)
PA for dexlansoprazole 60 mg cap dr bp has been approved. Approval letter to be scanned into pt's chart.

## 2021-02-10 NOTE — ED Notes (Signed)
Pt states she has an appointment over in [place] at 10 am and has to leave

## 2021-02-10 NOTE — Telephone Encounter (Signed)
She does have positive gallstones and tenderness on the exam.  I would recommend general surgery consultation.  Locally I would recommend Dr. Constance Haw or Dr. Arnoldo Morale. (If she is preferring elsewhere then we could utilize [facility] surgery) it can take a while to get in with general surgery therefore recommend initiating consultation

## 2021-02-11 ENCOUNTER — Telehealth: Payer: Self-pay | Admitting: Family Medicine

## 2021-02-11 LAB — COMPREHENSIVE METABOLIC PANEL
ALT: 90 U/L — ABNORMAL HIGH (ref 0–44)
AST: 102 U/L — ABNORMAL HIGH (ref 15–41)
Albumin: 3.7 g/dL (ref 3.5–5.0)
Alkaline Phosphatase: 98 U/L (ref 38–126)
Anion gap: 9 (ref 5–15)
BUN: 11 mg/dL (ref 6–20)
CO2: 22 mmol/L (ref 22–32)
Calcium: 8.7 mg/dL — ABNORMAL LOW (ref 8.9–10.3)
Chloride: 104 mmol/L (ref 98–111)
Creatinine, Ser: 0.77 mg/dL (ref 0.44–1.00)
GFR, Estimated: >60 mL/min (ref >60)
Glucose, Bld: 132 mg/dL — ABNORMAL HIGH (ref 70–99)
Potassium: 3.9 mmol/L (ref 3.5–5.1)
Sodium: 135 mmol/L (ref 135–145)
Total Bilirubin: 4.8 mg/dL — ABNORMAL HIGH (ref 0.3–1.2)
Total Protein: 7.4 g/dL (ref 6.5–8.1)

## 2021-02-11 NOTE — Telephone Encounter (Signed)
Patient is requesting something for her gall bladder to be called in because she cant eat or drink because she is in so much pain.  Seltzer please advise

## 2021-02-12 NOTE — Telephone Encounter (Signed)
If her pain is severe or unbearable she ought to go to the emergency department  She recently went to the ER and left before being evaluated She had an ultrasound in December which did show gallstones She will need a referral to general surgery either Dr. Arnoldo Morale and Dr. Constance Haw here in [place] or [place] [facility] in [facility] May go ahead with referral Patient needs to understand it can take several weeks to get in with surgeons If she is having severe pain as she cannot stand she needs to go to the ER

## 2021-02-12 NOTE — Telephone Encounter (Signed)
Urgent referral was placed 02/10/21. Patient states the pain is not terrible now but she is having problems with gas patient would also like a script for GI cocktail to have on hand

## 2021-02-13 ENCOUNTER — Encounter (HOSPITAL_COMMUNITY): Payer: Self-pay | Admitting: Orthopedic Surgery

## 2021-02-13 ENCOUNTER — Encounter (HOSPITAL_COMMUNITY): Payer: Self-pay | Admitting: Physician Assistant

## 2021-02-13 ENCOUNTER — Other Ambulatory Visit: Payer: Self-pay

## 2021-02-13 NOTE — Telephone Encounter (Signed)
Patient notified and verbalized understanding. 

## 2021-02-13 NOTE — Telephone Encounter (Signed)
Personally I do not have any idea what the GI cocktail is.  This is prescribed via ER for use in the emergency department  I think her best approach would be nausea medicine when necessary, may use Mylanta or Maalox as needed, and move forward with the surgical consultation.

## 2021-02-13 NOTE — Progress Notes (Signed)
PCP - Sallee Lange, MD Cardiologist - denies  PPM/ICD - n/a  Chest x-ray - 02/09/21 EKG - 02/10/21 Stress Test - n/a ECHO - 02/12/20 Cardiac Cath - n/a  ERAS Protcol - Clear liquids  COVID TEST- n/a  Anesthesia review: yes d/t anesthesia complications  Patient verbally denies any shortness of breath, fever, cough and chest pain during phone call   -------------  SDW INSTRUCTIONS given:  Your procedure is scheduled on 02/17/21 0815.  Report to [facility] Main Entrance "A" at 0545 A.M., and check in at the Admitting office.  Call this number if you have problems the morning of surgery:  [phone]   Remember:  Do not eat after midnight the night before your surgery  You may drink clear liquids until 0515 the morning of your surgery.   Clear liquids allowed are: Water, Non-Citrus Juices (without pulp), Carbonated Beverages, Clear Tea, Black Coffee Only, and Gatorade   Take these medicines the morning of surgery with A SIP OF WATER Subutex, Metoprolol, Dexilant, Protonix, Gabapentin, Linzess, Flovent, Flonase, Colace, Albuterol and Tylenol   Please bring all inhalers with you the day of surgery.    As of today, STOP taking any Aspirin (unless otherwise instructed by your surgeon) Aleve, Naproxen, Ibuprofen, Motrin, Advil, Goody's, BC's, all herbal medications, fish oil, and all vitamins.                      Do not wear jewelry, make up, or nail polish            Do not wear lotions, powders, perfumes/colognes, or deodorant.            Do not shave 48 hours prior to surgery.  Men may shave face and neck.            Do not bring valuables to the hospital.            [facility] is not responsible for any belongings or valuables.  Do NOT Smoke (Tobacco/Vaping) or drink Alcohol 24 hours prior to your procedure If you use a CPAP at night, you may bring all equipment for your overnight stay.   Contacts, glasses, dentures or bridgework may not be worn into surgery.       For patients admitted to the hospital, discharge time will be determined by your treatment team.   Patients discharged the day of surgery will not be allowed to drive home, and someone needs to stay with them for 24 hours.    Special instructions:   [facility]- Preparing For Surgery  Before surgery, you can play an important role. Because skin is not sterile, your skin needs to be as free of germs as possible. You can reduce the number of germs on your skin by washing with CHG (chlorahexidine gluconate) Soap before surgery.  CHG is an antiseptic cleaner which kills germs and bonds with the skin to continue killing germs even after washing.    Oral Hygiene is also important to reduce your risk of infection.  Remember - BRUSH YOUR TEETH THE MORNING OF SURGERY WITH YOUR REGULAR TOOTHPASTE  Please do not use if you have an allergy to CHG or antibacterial soaps. If your skin becomes reddened/irritated stop using the CHG.  Do not shave (including legs and underarms) for at least 48 hours prior to first CHG shower. It is OK to shave your face.  Please follow these instructions carefully.   Shower the NIGHT BEFORE SURGERY and the [facility]  OF SURGERY with DIAL Soap.   Pat yourself dry with a CLEAN TOWEL.  Wear CLEAN PAJAMAS to bed the night before surgery  Place CLEAN SHEETS on your bed the night of your first shower and DO NOT SLEEP WITH PETS.   Day of Surgery: Please shower morning of surgery  Wear Clean/Comfortable clothing the morning of surgery Do not apply any deodorants/lotions.   Remember to brush your teeth WITH YOUR REGULAR TOOTHPASTE.   Questions were answered. Patient verbalized understanding of instructions.

## 2021-02-17 ENCOUNTER — Telehealth: Payer: Self-pay | Admitting: Family Medicine

## 2021-02-17 ENCOUNTER — Ambulatory Visit (HOSPITAL_COMMUNITY)
Admission: RE | Admit: 2021-02-17 | Discharge: 2021-02-17 | Disposition: A | Discharge status: Home / Self Care | Payer: Medicaid Other | Attending: Family Medicine | Admitting: Family Medicine

## 2021-02-17 ENCOUNTER — Ambulatory Visit (HOSPITAL_COMMUNITY): Payer: Medicaid Other | Admitting: Anesthesiology

## 2021-02-17 ENCOUNTER — Encounter (HOSPITAL_COMMUNITY)
Admission: RE | Disposition: A | Discharge status: Home / Self Care | Payer: Self-pay | Source: Home / Self Care | Attending: Family Medicine

## 2021-02-17 ENCOUNTER — Ambulatory Visit (HOSPITAL_COMMUNITY)
Admission: RE | Admit: 2021-02-17 | Discharge: 2021-02-17 | Disposition: A | Discharge status: Home / Self Care | Payer: Medicaid Other | Source: Ambulatory Visit | Attending: Family Medicine | Admitting: Family Medicine

## 2021-02-17 ENCOUNTER — Encounter (HOSPITAL_COMMUNITY): Payer: Self-pay

## 2021-02-17 DIAGNOSIS — K760 Fatty (change of) liver, not elsewhere classified: Secondary | ICD-10-CM | POA: Insufficient documentation

## 2021-02-17 DIAGNOSIS — N2889 Other specified disorders of kidney and ureter: Secondary | ICD-10-CM | POA: Insufficient documentation

## 2021-02-17 DIAGNOSIS — N281 Cyst of kidney, acquired: Secondary | ICD-10-CM | POA: Insufficient documentation

## 2021-02-17 DIAGNOSIS — K8020 Calculus of gallbladder without cholecystitis without obstruction: Secondary | ICD-10-CM | POA: Diagnosis not present

## 2021-02-17 HISTORY — PX: RADIOLOGY WITH ANESTHESIA: SHX6223

## 2021-02-17 HISTORY — DX: Foot drop, right foot: M21.371

## 2021-02-17 LAB — BASIC METABOLIC PANEL
Anion gap: 9 (ref 5–15)
BUN: 12 mg/dL (ref 6–20)
CO2: 21 mmol/L — ABNORMAL LOW (ref 22–32)
Calcium: 8.3 mg/dL — ABNORMAL LOW (ref 8.9–10.3)
Chloride: 107 mmol/L (ref 98–111)
Creatinine, Ser: 0.68 mg/dL (ref 0.44–1.00)
GFR, Estimated: >60 mL/min (ref >60)
Glucose, Bld: 112 mg/dL — ABNORMAL HIGH (ref 70–99)
Potassium: 3.9 mmol/L (ref 3.5–5.1)
Sodium: 137 mmol/L (ref 135–145)

## 2021-02-17 LAB — POCT PREGNANCY, URINE: Preg Test, Ur: NEGATIVE

## 2021-02-17 SURGERY — MRI WITH ANESTHESIA
Anesthesia: General

## 2021-02-17 MED ORDER — DEXAMETHASONE SODIUM PHOSPHATE 10 MG/ML IJ SOLN
INTRAMUSCULAR | Status: DC | PRN
  Administered 2021-02-17: 10 mg via INTRAVENOUS

## 2021-02-17 MED ORDER — ONDANSETRON HCL 4 MG/2ML IJ SOLN
INTRAMUSCULAR | Status: DC | PRN
  Administered 2021-02-17: 4 mg via INTRAVENOUS

## 2021-02-17 MED ORDER — DEXMEDETOMIDINE HCL IN NACL 200 MCG/50ML IV SOLN
INTRAVENOUS | Status: DC | PRN
  Administered 2021-02-17: 12 ug via INTRAVENOUS
  Administered 2021-02-17: 8 ug via INTRAVENOUS

## 2021-02-17 MED ORDER — LIDOCAINE 2% (20 MG/ML) 5 ML SYRINGE
INTRAMUSCULAR | Status: DC | PRN
  Administered 2021-02-17: 100 mg via INTRAVENOUS

## 2021-02-17 MED ORDER — ORAL CARE MOUTH RINSE: 15.0000 mL | Freq: Once | OROMUCOSAL | Status: AC

## 2021-02-17 MED ORDER — ONDANSETRON HCL 4 MG/2ML IJ SOLN: 4.0000 mg | Freq: Four times a day (QID) | INTRAMUSCULAR | Status: DC | PRN

## 2021-02-17 MED ORDER — MIDAZOLAM HCL 5 MG/5ML IJ SOLN
INTRAMUSCULAR | Status: DC | PRN
  Administered 2021-02-17 (×2): 2 mg via INTRAVENOUS

## 2021-02-17 MED ORDER — FENTANYL CITRATE (PF) 100 MCG/2ML IJ SOLN
INTRAMUSCULAR | Status: DC | PRN
  Administered 2021-02-17: 100 ug via INTRAVENOUS

## 2021-02-17 MED ORDER — SUGAMMADEX SODIUM 200 MG/2ML IV SOLN
INTRAVENOUS | Status: DC | PRN
  Administered 2021-02-17: 200 mg via INTRAVENOUS
  Administered 2021-02-17: 100 mg via INTRAVENOUS

## 2021-02-17 MED ORDER — PHENYLEPHRINE HCL-NACL 20-0.9 MG/250ML-% IV SOLN
INTRAVENOUS | Status: DC | PRN
  Administered 2021-02-17: 20 ug/min via INTRAVENOUS

## 2021-02-17 MED ORDER — ROCURONIUM BROMIDE 10 MG/ML (PF) SYRINGE
PREFILLED_SYRINGE | INTRAVENOUS | Status: DC | PRN
  Administered 2021-02-17: 60 mg via INTRAVENOUS

## 2021-02-17 MED ORDER — CHLORHEXIDINE GLUCONATE 0.12 % MT SOLN
15.0000 mL | Freq: Once | OROMUCOSAL | Status: AC
  Administered 2021-02-17: 08:00:00 15 mL via OROMUCOSAL

## 2021-02-17 MED ORDER — GADOBUTROL 1 MMOL/ML IV SOLN
10.0000 mL | Freq: Once | INTRAVENOUS | Status: AC | PRN
  Administered 2021-02-17: 10:00:00 10 mL via INTRAVENOUS

## 2021-02-17 MED ORDER — LACTATED RINGERS IV SOLN: INTRAVENOUS | Status: DC

## 2021-02-17 MED ORDER — PROPOFOL 10 MG/ML IV BOLUS
INTRAVENOUS | Status: DC | PRN
  Administered 2021-02-17: 200 mg via INTRAVENOUS

## 2021-02-17 NOTE — Telephone Encounter (Signed)
Pt wants to know if she had a tube put down her throat for her MRI of the abdomen today as her throat is very sore. [phone]

## 2021-02-17 NOTE — Transfer of Care (Signed)
Immediate Anesthesia Transfer of Care Note  Patient: Belinda [name]  Procedure(s) Performed: MRI ABDOMEN WITH AND WITHOUT CONTRAST WITH ANESTHESIA  Patient Location: PACU  Anesthesia Type:General  Level of Consciousness: awake and patient cooperative  Airway & Oxygen Therapy: Patient Spontanous Breathing and Patient connected to face mask oxygen  Post-op Assessment: Report given to RN and Post -op Vital signs reviewed and stable  Post vital signs: Reviewed and stable  Last Vitals:  Vitals Value Taken Time  BP 91/59 02/17/21 0945  Temp    Pulse 92 02/17/21 0949  Resp 19 02/17/21 0949  SpO2 91 % 02/17/21 0949  Vitals shown include unvalidated device data.  Last Pain:  Vitals:   02/17/21 0710  TempSrc: Oral         Complications: No notable events documented.

## 2021-02-17 NOTE — Progress Notes (Signed)
Radiology notified this RN regarding MRI result- Acute Pancreatitis. Dr. Lance Sell  informed of MRI. Per Dr.  Wolfgang Phoenix patient can be discharge home from PACU. Inform patient to call office to schedule an appointment.  If there is an abdominal pain go to emergency room.

## 2021-02-17 NOTE — Telephone Encounter (Signed)
Per note from today it looks as if she did have tube in throat (just want to make sure I am correct before advising pt ) Please advise. Thank you

## 2021-02-17 NOTE — Telephone Encounter (Signed)
Dr from [facility] contacted office to give provider results of MRI. Provider spoke with Zacarias Pontes.

## 2021-02-17 NOTE — Anesthesia Procedure Notes (Signed)
Procedure Name: Intubation Date/Time: 02/17/2021 8:28 AM Performed by: Renato Shin, CRNA Pre-anesthesia Checklist: Patient identified, Emergency Drugs available, Suction available and Patient being monitored Patient Re-evaluated:Patient Re-evaluated prior to induction Oxygen Delivery Method: Circle system utilized Preoxygenation: Pre-oxygenation with 100% oxygen Induction Type: IV induction Ventilation: Mask ventilation without difficulty Laryngoscope Size: Miller and 3 Grade View: Grade I Tube type: Oral Tube size: 7.0 mm Number of attempts: 1 Airway Equipment and Method: Stylet and Oral airway Placement Confirmation: ETT inserted through vocal cords under direct vision, positive ETCO2 and breath sounds checked- equal and bilateral Secured at: 21 cm Tube secured with: Tape Dental Injury: Teeth and Oropharynx as per pre-operative assessment

## 2021-02-17 NOTE — Telephone Encounter (Signed)
In the situations where the patient needs sedation in order to tolerate the MRI, they have to use a tube temporarily in order to keep the airway safe, as a result it is not unusual to have a pretty bad sore throat for 1 to 3 days which will gradually get better.

## 2021-02-17 NOTE — Anesthesia Preprocedure Evaluation (Signed)
Anesthesia Evaluation  Patient identified by MRN, date of birth, ID band Patient awake    Reviewed: Allergy & Precautions, H&P , NPO status , Patient's Chart, lab work & pertinent test results  Airway Mallampati: II   Neck ROM: full    Dental   Pulmonary asthma , former smoker,    breath sounds clear to auscultation       Cardiovascular negative cardio ROS   Rhythm:regular Rate:Normal     Neuro/Psych  Headaches, PSYCHIATRIC DISORDERS Anxiety Bipolar Disorder  Neuromuscular disease    GI/Hepatic GERD  ,  Endo/Other  obese  Renal/GU      Musculoskeletal   Abdominal   Peds  Hematology   Anesthesia Other Findings   Reproductive/Obstetrics                             Anesthesia Physical Anesthesia Plan  ASA: 2  Anesthesia Plan: General   Post-op Pain Management:    Induction: Intravenous  PONV Risk Score and Plan: 3 and Ondansetron, Dexamethasone, Midazolam and Treatment may vary due to age or medical condition  Airway Management Planned: Oral ETT  Additional Equipment:   Intra-op Plan:   Post-operative Plan: Extubation in OR  Informed Consent: I have reviewed the patients History and Physical, chart, labs and discussed the procedure including the risks, benefits and alternatives for the proposed anesthesia with the patient or authorized representative who has indicated his/her understanding and acceptance.     Dental advisory given  Plan Discussed with: CRNA, Anesthesiologist and Surgeon  Anesthesia Plan Comments:         Anesthesia Quick Evaluation

## 2021-02-17 NOTE — Discharge Instructions (Signed)
Schedule an appointment with Dr. Wolfgang Phoenix. For abdominal pain. Go to emergency room.

## 2021-02-17 NOTE — Telephone Encounter (Signed)
Patient aware she has follow-up visit tomorrow will order labs at that time

## 2021-02-17 NOTE — Telephone Encounter (Signed)
Pt contacted and verbalized understanding.  

## 2021-02-18 ENCOUNTER — Other Ambulatory Visit (HOSPITAL_COMMUNITY)
Admission: RE | Admit: 2021-02-18 | Discharge: 2021-02-18 | Disposition: A | Discharge status: Home / Self Care | Payer: Medicaid Other | Source: Ambulatory Visit | Attending: Family Medicine | Admitting: Family Medicine

## 2021-02-18 ENCOUNTER — Encounter (HOSPITAL_COMMUNITY): Payer: Self-pay | Admitting: Radiology

## 2021-02-18 ENCOUNTER — Ambulatory Visit (INDEPENDENT_AMBULATORY_CARE_PROVIDER_SITE_OTHER): Payer: Medicaid Other | Admitting: Family Medicine

## 2021-02-18 ENCOUNTER — Other Ambulatory Visit: Payer: Self-pay

## 2021-02-18 VITALS — BP 121/81 | HR 69 | Temp 98.6°F | Ht 63.0 in | Wt 235.0 lb

## 2021-02-18 DIAGNOSIS — K858 Other acute pancreatitis without necrosis or infection: Secondary | ICD-10-CM | POA: Diagnosis present

## 2021-02-18 DIAGNOSIS — K760 Fatty (change of) liver, not elsewhere classified: Secondary | ICD-10-CM | POA: Insufficient documentation

## 2021-02-18 DIAGNOSIS — F411 Generalized anxiety disorder: Secondary | ICD-10-CM

## 2021-02-18 DIAGNOSIS — K8020 Calculus of gallbladder without cholecystitis without obstruction: Secondary | ICD-10-CM

## 2021-02-18 LAB — CBC WITH DIFFERENTIAL/PLATELET
Abs Immature Granulocytes: 0.04 10*3/uL (ref 0.00–0.07)
Basophils Absolute: 0 10*3/uL (ref 0.0–0.1)
Basophils Relative: 0 %
Eosinophils Absolute: 0 10*3/uL (ref 0.0–0.5)
Eosinophils Relative: 0 %
HCT: 36.7 % (ref 36.0–46.0)
Hemoglobin: 12.2 g/dL (ref 12.0–15.0)
Immature Granulocytes: 1 %
Lymphocytes Relative: 34 %
Lymphs Abs: 2.9 10*3/uL (ref 0.7–4.0)
MCH: 29.6 pg (ref 26.0–34.0)
MCHC: 33.2 g/dL (ref 30.0–36.0)
MCV: 89.1 fL (ref 80.0–100.0)
Monocytes Absolute: 0.8 10*3/uL (ref 0.1–1.0)
Monocytes Relative: 9 %
Neutro Abs: 4.7 10*3/uL (ref 1.7–7.7)
Neutrophils Relative %: 56 %
Platelets: 297 10*3/uL (ref 150–400)
RBC: 4.12 MIL/uL (ref 3.87–5.11)
RDW: 13.4 % (ref 11.5–15.5)
WBC: 8.4 10*3/uL (ref 4.0–10.5)
nRBC: 0 % (ref 0.0–0.2)

## 2021-02-18 LAB — BASIC METABOLIC PANEL
Anion gap: 8 (ref 5–15)
BUN: 14 mg/dL (ref 6–20)
CO2: 23 mmol/L (ref 22–32)
Calcium: 8.6 mg/dL — ABNORMAL LOW (ref 8.9–10.3)
Chloride: 108 mmol/L (ref 98–111)
Creatinine, Ser: 0.65 mg/dL (ref 0.44–1.00)
GFR, Estimated: >60 mL/min (ref >60)
Glucose, Bld: 105 mg/dL — ABNORMAL HIGH (ref 70–99)
Potassium: 3.8 mmol/L (ref 3.5–5.1)
Sodium: 139 mmol/L (ref 135–145)

## 2021-02-18 LAB — HEPATIC FUNCTION PANEL
ALT: 18 U/L (ref 0–44)
AST: 16 U/L (ref 15–41)
Albumin: 3.6 g/dL (ref 3.5–5.0)
Alkaline Phosphatase: 62 U/L (ref 38–126)
Bilirubin, Direct: <0.1 mg/dL (ref 0.0–0.2)
Total Bilirubin: 0.4 mg/dL (ref 0.3–1.2)
Total Protein: 7.4 g/dL (ref 6.5–8.1)

## 2021-02-18 LAB — LIPASE, BLOOD: Lipase: 85 U/L — ABNORMAL HIGH (ref 11–51)

## 2021-02-18 MED ORDER — ONDANSETRON HCL 8 MG PO TABS: 8.0000 mg | ORAL_TABLET | Freq: Three times a day (TID) | ORAL | 2 refills | Status: DC | PRN

## 2021-02-18 MED ORDER — ALPRAZOLAM 1 MG PO TABS: 1.0000 mg | ORAL_TABLET | Freq: Four times a day (QID) | ORAL | 4 refills | Status: DC | PRN

## 2021-02-18 NOTE — Progress Notes (Signed)
° °  Subjective:    Patient ID: Belinda [name], female    DOB: Feb 18, 1981, 40 y.o.   MRN: [id number]  HPI  MRI results patient working hard at trying to tolerate clear liquids bland food over the past few days MRI was being done to look for kidney lesion but in the middle of all this it was discovered she has pancreatitis Recently she went to the ER because of gallbladder pain but left before is being seen she has been referred to a surgeon  Also has chronic anxiety takes Xanax for that we have weaned her down to 12 tablets/month  Review of Systems     Objective:   Physical Exam  General-in no acute distress Eyes-no discharge Lungs-respiratory rate normal, CTA CV-no murmurs,RRR Extremities skin warm dry no edema Neuro grossly normal Behavior normal, alert   Patient was advised to go to the ER if she has severe pain or discomfort    Assessment & Plan:  Chronic anxiety continue Xanax refills given  Pancreatitis bland diet clear liquids check lab work we will see if we can get the patient in sooner with [facility] surgery

## 2021-02-19 ENCOUNTER — Telehealth: Payer: Self-pay | Admitting: Family Medicine

## 2021-02-19 ENCOUNTER — Ambulatory Visit: Payer: Medicaid Other | Admitting: Family Medicine

## 2021-02-19 NOTE — Telephone Encounter (Signed)
Lipase very improved compared to where it was Other test came back normal Recommend bland diet clear liquids If over the weekend if getting significantly worse back to ER Patient should have a follow-up with [place] in 2 to 3 weeks  Please have Loma Sousa connect with [place] surgery to see if they could move her appointment up because of gallstones and pancreatitis

## 2021-02-19 NOTE — Telephone Encounter (Signed)
Patient advised per Dr Nicki Reaper: Lipase very improved compared to where it was Other test came back normal Recommend bland diet clear liquids If over the weekend if getting significantly worse back to ER Patient should have a follow-up with [place] in 2 to 3 weeks  Patient verbalized understanding. Referral coordinator to check with [facility] to see if they can move up her appt and notify patient   Patient states she missed her ENT appt last week and needs to be rescheduled. Patient stated she also thought she was being set up for pulmonologist and also wondering about a second opinion about her leg and the nerve issues- saw Dr Debarah Crape) and they told her she had no nerve issues  Patient states she thinks she will just do the gall bladder first and then make an appt to discuss everything else after she has recovered

## 2021-02-19 NOTE — Anesthesia Postprocedure Evaluation (Signed)
Anesthesia Post Note  Patient: Belinda [name]  Procedure(s) Performed: MRI ABDOMEN WITH AND WITHOUT CONTRAST WITH ANESTHESIA     Patient location during evaluation: PACU Anesthesia Type: General Level of consciousness: awake and alert Pain management: pain level controlled Vital Signs Assessment: post-procedure vital signs reviewed and stable Respiratory status: spontaneous breathing, nonlabored ventilation, respiratory function stable and patient connected to nasal cannula oxygen Cardiovascular status: blood pressure returned to baseline and stable Postop Assessment: no apparent nausea or vomiting Anesthetic complications: no   No notable events documented.  Last Vitals:  Vitals:   02/17/21 1115 02/17/21 1130  BP: 115/83 103/85  Pulse: 86 79  Resp: 15 17  Temp:  (!) 36.4 C  SpO2: 93% 95%    Last Pain:  Vitals:   02/17/21 1130  TempSrc:   PainSc: 0-No pain                 Corneluis Allston S

## 2021-02-19 NOTE — Telephone Encounter (Signed)
Called wanting lab results

## 2021-02-19 NOTE — Telephone Encounter (Signed)
Please advise. Thank you

## 2021-02-20 NOTE — Telephone Encounter (Signed)
I would recommend the patient focusing on the gallbladder get that completed then we will work with the other referrals, she can bring it up after gallbladder issue is taking care of

## 2021-02-20 NOTE — Telephone Encounter (Signed)
Patient stated she will get over her gallbladder surgery and then schedule an appt to discuss other referrals.

## 2021-02-27 ENCOUNTER — Other Ambulatory Visit: Payer: Self-pay | Admitting: Family Medicine

## 2021-02-27 NOTE — Telephone Encounter (Signed)
Patient is requesting refill on gabapentin 300 mg sent to [place] -her number [phone]

## 2021-02-27 NOTE — Telephone Encounter (Signed)
Patient called back and stated to disregard the message because she had a script on file to be filled already at pharmacy.

## 2021-03-04 ENCOUNTER — Telehealth: Payer: Self-pay | Admitting: Family Medicine

## 2021-03-04 NOTE — Telephone Encounter (Signed)
Please advise. Thank you

## 2021-03-04 NOTE — Telephone Encounter (Signed)
LMTRC

## 2021-03-04 NOTE — Telephone Encounter (Signed)
Patient is requesting a prescription for  a driver-wheelchair pad for because the chair makes her bottom and hips hurt . Also she wants something called in for cough and congestion for a week now. [place] [facility]

## 2021-03-04 NOTE — Telephone Encounter (Signed)
She is on controlled meds - there are no safe Rx cough meds use OTC May have rx for pad

## 2021-03-05 ENCOUNTER — Other Ambulatory Visit: Payer: Self-pay | Admitting: Family Medicine

## 2021-03-06 NOTE — Telephone Encounter (Signed)
Left message to return call 

## 2021-03-07 ENCOUNTER — Other Ambulatory Visit: Payer: Self-pay | Admitting: Family Medicine

## 2021-03-09 ENCOUNTER — Ambulatory Visit: Payer: Self-pay | Admitting: Surgery

## 2021-03-23 NOTE — Telephone Encounter (Signed)
Patient states her specialist gave her Augmenting for her cold and she will call [place] [facility] to see if her insurance approved the pillow for her wheelchair.

## 2021-03-26 ENCOUNTER — Telehealth: Payer: Self-pay | Admitting: Family Medicine

## 2021-03-26 ENCOUNTER — Other Ambulatory Visit: Payer: Self-pay | Admitting: Family Medicine

## 2021-03-26 MED ORDER — GABAPENTIN 300 MG PO CAPS: ORAL_CAPSULE | ORAL | 1 refills | Status: DC

## 2021-03-26 NOTE — Telephone Encounter (Signed)
Pt called and stated that she will need a refill of Gabapentin in 4 days at [facility] in [place]. She stated she increased her medication from 2 3 x day to 3 3x a day. She said that the 2 3 x a day was not working. She asked that Dr Nicki Reaper write the refill for 3 3x a day.   [phone]

## 2021-03-26 NOTE — Telephone Encounter (Signed)
Patient informed that rx sent in as requested and to follow up by mid spring per Dr.Scott. verbalized understanding.

## 2021-03-26 NOTE — Telephone Encounter (Signed)
Refill sent in as requested follow-up by mid spring

## 2021-03-27 ENCOUNTER — Other Ambulatory Visit: Payer: Self-pay

## 2021-03-27 ENCOUNTER — Encounter: Payer: Self-pay | Admitting: Pulmonary Disease

## 2021-03-27 ENCOUNTER — Ambulatory Visit (INDEPENDENT_AMBULATORY_CARE_PROVIDER_SITE_OTHER): Payer: Medicaid Other | Admitting: Pulmonary Disease

## 2021-03-27 VITALS — BP 116/66 | HR 75 | Temp 98.0°F | Ht 63.0 in | Wt 250.0 lb

## 2021-03-27 DIAGNOSIS — U099 Post covid-19 condition, unspecified: Secondary | ICD-10-CM

## 2021-03-27 DIAGNOSIS — L409 Psoriasis, unspecified: Secondary | ICD-10-CM | POA: Diagnosis not present

## 2021-03-27 DIAGNOSIS — R0609 Other forms of dyspnea: Secondary | ICD-10-CM

## 2021-03-27 NOTE — Patient Instructions (Signed)
We will get a high-resolution CT and PFTs for evaluation of the lung Referral to dermatology for evaluation of psoriasis Follow-up in 1 to 2 months after these tests.

## 2021-03-27 NOTE — Progress Notes (Signed)
Belinda [name]    [id number]    Oct 01, 1980  Primary Care Physician:Luking, Elayne Snare, MD  Referring Physician: Michael Boston, MD [address] 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  Admitted on 02/06/2020 with severe hypoxic respiratory failure, ARDS secondary to COVID-19.  She was intubated and admitted to the ICU.  She had a prolonged hospital stay requiring tracheostomy on 02/19/2020.  Eventually went to rehab and decannulated on 03/16/2020 and sent to rehab  Post discharge she has persistent symptoms of dyspnea on exertion, fatigue and long-haul COVID syndrome.  She is also dealing with significant PTSD after ICU stay which has exacerbated her baseline anxiety.  She follows with psychiatry for this  She developed flu infection around Thanksgiving of 2022.  A subsequent MRI abdomen on 02/17/2021 showed lung inflammation and she has been referred here for further evaluation  She was seen by Dr. West Carbo, ENT in January 2023 for chronic maxillary sinusitis and prescribed Augmentin Seen by Dr. Johney Maine in January 2023 for chronic cholecystitis and may need a cholecystectomy.  She will need pulmonary clearance before surgery  History notable for psoriasis and she reports worsening skin changes over the past few months  Pets: No pets Occupation: Currently on disability Exposures: No mold, hot tub, Jacuzzi.  No feather pillows or comforter Smoking history: She started smoking half pack per day since December 2022 Travel history: No significant travel history Relevant family history: No feather pillows or comforters  Outpatient Encounter Medications as of 03/27/2021  Medication Sig   acetaminophen (TYLENOL) 325 MG tablet Take 950 mg by mouth 2 (two) times daily.   albuterol (VENTOLIN HFA) 108 (90 Base) MCG/ACT inhaler INHALE 2 PUFFS INTO THE LUNGS EVERY 6  HOURS AS NEEDED FOR WHEEZING.   ALPRAZolam (XANAX) 1 MG tablet Take 1 tablet (1 mg total) by mouth 4 (four) times daily as needed for anxiety.   amoxicillin-clavulanate (AUGMENTIN) 875-125 MG tablet 1 tablet by mouth twice daily   buprenorphine (SUBUTEX) 8 MG SUBL SL tablet Place 16 mg under the tongue daily.   clobetasol (TEMOVATE) 0.05 % external solution APPLY EXTERNALLY TO THE AFFECTED AREA TWICE DAILY AS NEEDED.   Colloidal Oatmeal (GOLD BOND ECZEMA RELIEF) 2 % CREA Apply 1 application topically as needed (psoriasis).   DEXILANT 60 MG capsule TAKE 1 CAPSULE BY MOUTH ONCE DAILY.   docusate sodium (COLACE) 100 MG capsule Take 100 mg by mouth as needed for mild constipation.   famotidine (PEPCID) 40 MG tablet TAKE 1 TABLET ONCE DAILY. (Patient taking differently: Take 40 mg by mouth at bedtime.)   fluticasone (FLONASE) 50 MCG/ACT nasal spray USE (2) SPRAYS IN EACH NOSTRIL ONCE DAILY.   fluticasone (FLOVENT HFA) 110 MCG/ACT inhaler INHALE 1 PUFF TWICE A DAY FOR ASTHMA.   furosemide (LASIX) 20 MG tablet TAKE 1/2 TO 1 TABLET IN THE MORNING AS NEEDED FOR SWELLING IN LEGS.   gabapentin (NEURONTIN) 300 MG capsule TAKE 3 CAPSULES THREE TIMES DAILY.   ibuprofen (ADVIL) 200 MG tablet Take 600 mg by mouth 2 (two) times daily.   ketoconazole (NIZORAL) 2 % cream Apply 1 application topically 2 (two) times daily. Apply thin amount under fold of breast bilateral twice daily as needed (Patient taking differently: Apply 1 application topically 2 (two) times daily as needed (yeast).)   LINZESS 290 MCG CAPS capsule  TAKE 1 CAPSULE BEFORE BREAKFAST.   metoprolol tartrate (LOPRESSOR) 25 MG tablet TAKE (1/2) TABLET BY MOUTH 2 TIMES A DAY.   naphazoline-pheniramine (ALLERGY EYE) 0.025-0.3 % ophthalmic solution Place 1-2 drops into both eyes 4 (four) times daily as needed for eye irritation.   ondansetron (ZOFRAN) 8 MG tablet Take 1 tablet (8 mg total) by mouth every 8 (eight) hours as needed for nausea.   pantoprazole  (PROTONIX) 40 MG tablet Take 40 mg by mouth 2 (two) times daily as needed (acid reflux).   potassium chloride (KLOR-CON) 10 MEQ tablet Take 1 tablet (10 mEq total) by mouth 2 (two) times daily. (Patient taking differently: Take 10 mEq by mouth 2 (two) times daily as needed (cramps).)   Denture Care Products (DENTURE TABLETS) TBEF 1-2 tablets by Other route as needed (for denture cleaning). Fixodent   Denture Care Products (SUPER POLIGRIP) CREA Place 1 application onto teeth as needed (denture fixation).   [DISCONTINUED] cefdinir (OMNICEF) 300 MG capsule Take 1 capsule (300 mg total) by mouth 2 (two) times daily. (Patient not taking: Reported on 02/12/2021)   [DISCONTINUED] oseltamivir (TAMIFLU) 75 MG capsule Take 1 capsule (75 mg total) by mouth 2 (two) times daily. (Patient not taking: Reported on 02/12/2021)   No facility-administered encounter medications on file as of 03/27/2021.    Allergies as of 03/27/2021 - Review Complete 03/27/2021  Allergen Reaction Noted   Aspirin  02/12/2021   Buspar [buspirone] Other (See Comments) 03/04/2020   Celexa [citalopram hydrobromide] Other (See Comments) 06/24/2016   Lactose intolerance (gi) Diarrhea and Nausea And Vomiting 02/12/2021   Wellbutrin [bupropion] Other (See Comments) 03/04/2020   Zoloft [sertraline hcl] Other (See Comments) 06/28/2018   Doxycycline Rash 03/04/2020    Past Medical History:  Diagnosis Date   Anemia    Anxiety    compulsive worring, phobia   Anxiety    Anxiety disorder    Asthma    Bipolar affective ([place])    Complication of anesthesia    slow to wake, confusion   COVID    December 2021   Family history of adverse reaction to anesthesia    son has N&V   Fatty liver 11/14/2018   Fatty liver    Fatty liver    Foot drop, right foot    GAD (generalized anxiety disorder)    with panic attacks   Gallstones    GERD (gastroesophageal reflux disease)    Headache    History of COVID-19    History of shingles     Morbid obesity (HCC)    Narcotic addiction ([place])    Narcotic drug use    Neuromuscular disorder (HCC)    neuropathy   OCD (obsessive compulsive disorder)    Pneumonia    Psoriasis    Reactive airways dysfunction syndrome (HCC)    Right foot drop    Vaginal Pap smear, abnormal     Past Surgical History:  Procedure Laterality Date   CESAREAN SECTION     twice   COLPOSCOPY     DILATION AND CURETTAGE OF UTERUS     ESOPHAGOGASTRODUODENOSCOPY  07/2018   ESOPHAGOGASTRODUODENOSCOPY (EGD) WITH PROPOFOL N/A 08/03/2018   Procedure: ESOPHAGOGASTRODUODENOSCOPY (EGD) WITH PROPOFOL;  Surgeon: Daneil Dolin, MD;  [facility];  Service: Endoscopy;  Laterality: N/A;  11:00am   PID     RADIOLOGY WITH ANESTHESIA N/A 11/04/2020   Procedure: MRI WITH ANESTHESIA BRAIN WITH AND WITHOUT,CERVICAL WITH AND [facility] WITHOUT CONTRAST,THORACIC WITH AND WITHOUT;  Surgeon: Radiologist,  Medication, MD;  Location: [place];  Service: Radiology;  Laterality: N/A;   RADIOLOGY WITH ANESTHESIA N/A 02/17/2021   Procedure: MRI ABDOMEN WITH AND WITHOUT CONTRAST WITH ANESTHESIA;  Surgeon: Radiologist, Medication, MD;  Location: [place];  Service: Radiology;  Laterality: N/A;    Family History  Problem Relation Age of Onset   High blood pressure Father    Stroke Father    High blood pressure Brother    Hypertension Father    Heart attack Father    Heart disease Maternal Grandmother    Hypertension Brother    Breast cancer Maternal Aunt    Colon cancer Neg Hx     Social History   Socioeconomic History   Marital status: Legally Separated    Spouse name: Not on file   Number of children: 2   Years of education: some college   Highest education level: Not on file  Occupational History   Occupation: unemployed  Tobacco Use   Smoking status: Former    Types: Cigarettes   Smokeless tobacco: Never   Tobacco comments:    light smoker 5 cigs daily  Vaping Use   Vaping Use: Never used  Substance  and Sexual Activity   Alcohol use: No   Drug use: No    Comment: Denies   Sexual activity: Yes    Birth control/protection: Pill  Other Topics Concern   Not on file  Social History Narrative   Lives with son (boyfriend and mother supervise).   Left-handed.   Rare caffeine use.   Social Determinants of Health   Financial Resource Strain: Not on file  Food Insecurity: Not on file  Transportation Needs: Not on file  Physical Activity: Not on file  Stress: Not on file  Social Connections: Not on file  Intimate Partner Violence: Not on file    Review of systems: Review of Systems  Constitutional: Negative for fever and chills.  HENT: Negative.   Eyes: Negative for blurred vision.  Respiratory: as per HPI  Cardiovascular: Negative for chest pain and palpitations.  Gastrointestinal: Negative for vomiting, diarrhea, blood per rectum. Genitourinary: Negative for dysuria, urgency, frequency and hematuria.  Musculoskeletal: Negative for myalgias, back pain and joint pain.  Skin: Negative for itching and rash.  Neurological: Negative for dizziness, tremors, focal weakness, seizures and loss of consciousness.  Endo/Heme/Allergies: Negative for environmental allergies.  Psychiatric/Behavioral: Negative for depression, suicidal ideas and hallucinations.  All other systems reviewed and are negative.  Physical Exam: Blood pressure 116/66, pulse 75, temperature 98 F (36.7 C), temperature source Oral, height 5' 3"  (1.6 m), weight 250 lb (113.4 kg), SpO2 95 %. Gen:      No acute distress HEENT:  EOMI, sclera anicteric Neck:     No masses; no thyromegaly Lungs:    Clear to auscultation bilaterally; normal respiratory effort CV:         Regular rate and rhythm; no murmurs Abd:      + bowel sounds; soft, non-tender; no palpable masses, no distension Ext:    No edema; adequate peripheral perfusion Skin:      Warm and dry; no rash Neuro: alert and oriented x 3 Psych: normal mood and  affect  Data Reviewed: Imaging: CTA 05/06/2020-no pulmonary embolism, dense consolidation in the left  Left lower lobe, streaky opacities in the lingula and lower lobes.  I have reviewed the images personally.  MRI abdomen 02/17/2021-mild edema concerning for acute pancreatitis, bilateral pulmonary interstitial and airspace disease, gallstones.  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  This could be from prolonged stay with COVID infection with post-COVID ILD though CTA from March 2022 did not show any significant changes from her hospitalization.  She did have flu infection prior to the MRI and the changes could be from those  We will schedule high-res CT and pulmonary function test for further evaluation.  She will need pulmonary clearance for planned cholecystectomy.  We will reevaluate after review of above tests  Psoriasis, skin rash Referral to dermatology  Plan/Recommendations: High-res CT, PFTs Dermatology referral  Marshell Garfinkel MD [facility] 03/27/2021, 3:03 PM  CC: Michael Boston, MD

## 2021-03-30 ENCOUNTER — Telehealth: Payer: Self-pay | Admitting: Pulmonary Disease

## 2021-03-30 NOTE — Telephone Encounter (Signed)
Order was placed on 2/3 for pt to have a High Res CT next available appt.  I got CT scheduled and called pt to give her appt info and she states she had CT angio on 2/4 at the hospital.  Should High Res order be cancelled?  Please advise.

## 2021-03-30 NOTE — Telephone Encounter (Signed)
Error message

## 2021-04-02 ENCOUNTER — Telehealth: Payer: Self-pay | Admitting: Family Medicine

## 2021-04-02 DIAGNOSIS — L409 Psoriasis, unspecified: Secondary | ICD-10-CM

## 2021-04-02 NOTE — Telephone Encounter (Signed)
Referral for dermatology ordered in Epic  Patient wanted you to know that she increased her Metoprolol on her own to one whole tablet in am and 1/2 in pm. Patient states it seems to help with her anxiety and panic and panic attacks and even her gallbladder. Patient stated it help also when her HR goes up from trying to move around out of wheelchair and makes her panic.  Patient would like a new script with the new directions if possible

## 2021-04-02 NOTE — Telephone Encounter (Signed)
Nurses-May changes to 1 tablet in the morning half tablet in the evening, 30-day supply with 6 refills

## 2021-04-02 NOTE — Telephone Encounter (Signed)
Pt had a referral for a dermatologist from the dr. Quintella Baton she was sick and could not make appt. Would like the name of the dermatologist referred to so she can reschedule.   [phone]

## 2021-04-03 ENCOUNTER — Other Ambulatory Visit: Payer: Self-pay | Admitting: Family Medicine

## 2021-04-03 MED ORDER — METOPROLOL TARTRATE 25 MG PO TABS: ORAL_TABLET | ORAL | 6 refills | Status: DC

## 2021-04-03 NOTE — Telephone Encounter (Signed)
Changes made to prescription and sent to pharmacy.

## 2021-04-03 NOTE — Telephone Encounter (Signed)
LM on patient's voice mail that rx was changed and sent to the pharmacy. Any questions to call office.

## 2021-04-06 ENCOUNTER — Telehealth: Payer: Self-pay | Admitting: Pulmonary Disease

## 2021-04-06 NOTE — Telephone Encounter (Signed)
Fax received from [facility] to perform a Lap chole w/IOC on patient.  Patient needs surgery clearance. Patient was seen on 03/27/2021 and is scheduled for OV & PFT on 04/21/2021 and CT is scheduled for 04/07/2021. Office protocol is a risk assessment can be sent to surgeon if patient has been seen in 60 days or less.   Sending to Dr. Vaughan Browner for risk assessment or recommendations if patient needs to be seen in office prior to surgical procedure.

## 2021-04-07 ENCOUNTER — Ambulatory Visit (HOSPITAL_COMMUNITY)
Admission: RE | Admit: 2021-04-07 | Discharge: 2021-04-07 | Disposition: A | Discharge status: Home / Self Care | Payer: Medicaid Other | Source: Ambulatory Visit | Attending: Pulmonary Disease | Admitting: Pulmonary Disease

## 2021-04-07 ENCOUNTER — Other Ambulatory Visit: Payer: Self-pay

## 2021-04-07 DIAGNOSIS — R0609 Other forms of dyspnea: Secondary | ICD-10-CM | POA: Insufficient documentation

## 2021-04-07 NOTE — Telephone Encounter (Signed)
I am awaiting for HRCT and PFTs to review before giving pulmonary clearance.  She has a follow up visit with me on 28th and I will address it then.

## 2021-04-09 ENCOUNTER — Other Ambulatory Visit (HOSPITAL_COMMUNITY): Payer: Medicaid Other

## 2021-04-16 ENCOUNTER — Telehealth: Payer: Self-pay | Admitting: *Deleted

## 2021-04-16 NOTE — Telephone Encounter (Signed)
Patient states that she needs letter for disability for appeal. Patient was denied and got a lawyer and is processing an appeal. Patient states she needs to say all her medical issues and why she is currently and will never be able to work.

## 2021-04-19 NOTE — Telephone Encounter (Signed)
So I am willing to do a letter Hoyle Sauer is requesting this Nikki Dom Senior intake representative) In order to do a up-to-date version that would help her I recommend that she set up a follow-up office visit in March we can go over how things are going and be focused in on what areas she is noticing disability within-then we can document these and send it to her lawyer Nurses please keep the form available at the nurses station so when she comes in it is forwarded to

## 2021-04-20 NOTE — Telephone Encounter (Signed)
Patient scheduled office visit with Dr Nicki Reaper 04/30/21 at 1:30pm

## 2021-04-20 NOTE — Telephone Encounter (Signed)
Left message to return call 

## 2021-04-21 ENCOUNTER — Ambulatory Visit (INDEPENDENT_AMBULATORY_CARE_PROVIDER_SITE_OTHER): Payer: Medicaid Other | Admitting: Pulmonary Disease

## 2021-04-21 ENCOUNTER — Other Ambulatory Visit: Payer: Self-pay

## 2021-04-21 ENCOUNTER — Encounter: Payer: Self-pay | Admitting: Pulmonary Disease

## 2021-04-21 VITALS — BP 122/62 | HR 71 | Ht 63.0 in | Wt 257.6 lb

## 2021-04-21 DIAGNOSIS — R0609 Other forms of dyspnea: Secondary | ICD-10-CM

## 2021-04-21 DIAGNOSIS — U099 Post covid-19 condition, unspecified: Secondary | ICD-10-CM

## 2021-04-21 DIAGNOSIS — L409 Psoriasis, unspecified: Secondary | ICD-10-CM | POA: Diagnosis not present

## 2021-04-21 LAB — PULMONARY FUNCTION TEST
DL/VA % pred: 115 %
DL/VA: 5.17 ml/min/mmHg/L
DLCO cor % pred: 88 %
DLCO cor: 18.6 ml/min/mmHg
DLCO unc % pred: 88 %
DLCO unc: 18.6 ml/min/mmHg
FEF 25-75 Post: 3.65 L/sec
FEF 25-75 Pre: 2.92 L/sec
FEF2575-%Change-Post: 24 %
FEF2575-%Pred-Post: 118 %
FEF2575-%Pred-Pre: 95 %
FEV1-%Change-Post: 5 %
FEV1-%Pred-Post: 78 %
FEV1-%Pred-Pre: 74 %
FEV1-Post: 2.31 L
FEV1-Pre: 2.18 L
FEV1FVC-%Change-Post: 4 %
FEV1FVC-%Pred-Pre: 105 %
FEV6-%Change-Post: 1 %
FEV6-%Pred-Post: 72 %
FEV6-%Pred-Pre: 71 %
FEV6-Post: 2.55 L
FEV6-Pre: 2.52 L
FEV6FVC-%Pred-Post: 101 %
FEV6FVC-%Pred-Pre: 101 %
FVC-%Change-Post: 1 %
FVC-%Pred-Post: 71 %
FVC-%Pred-Pre: 70 %
FVC-Post: 2.55 L
FVC-Pre: 2.52 L
Post FEV1/FVC ratio: 90 %
Post FEV6/FVC ratio: 100 %
Pre FEV1/FVC ratio: 87 %
Pre FEV6/FVC Ratio: 100 %

## 2021-04-21 NOTE — Progress Notes (Signed)
Belinda [name]    [id number]    24-Jun-1980  Primary Care Physician:Luking, Elayne Snare, MD  Referring Physician: Kathyrn Drown, MD [address] [place],  [place] [address]  Chief complaint:   Follow-up for post COVID-75  HPI: 41 year old with history of anxiety, GERD, chronic pain, GERD, psoriasis.  Admitted on 02/06/2020 with severe hypoxic respiratory failure, ARDS secondary to COVID-19.  She was intubated and admitted to the ICU.  She had a prolonged hospital stay requiring tracheostomy on 02/19/2020.  Eventually went to rehab and decannulated on 03/16/2020 and sent to rehab  Post discharge she has persistent symptoms of dyspnea on exertion, fatigue and long-haul COVID syndrome.  She is also dealing with significant PTSD after ICU stay which has exacerbated her baseline anxiety.  She follows with psychiatry for this  She developed flu infection around Thanksgiving of 2022.  A subsequent MRI abdomen on 02/17/2021 showed lung inflammation and she has been referred here for further evaluation  She was seen by Dr. West Carbo, ENT in January 2023 for chronic maxillary sinusitis and prescribed Augmentin Seen by Dr. Johney Maine in January 2023 for chronic cholecystitis and may need a cholecystectomy.  She will need pulmonary clearance before surgery  History notable for psoriasis and she reports worsening skin changes over the past few months  Pets: No pets Occupation: Currently on disability Exposures: No mold, hot tub, Jacuzzi.  No feather pillows or comforter Smoking history: She started smoking half pack per day since December 2022 Travel history: No significant travel history Relevant family history: No feather pillows or comforters  Interim history: She is here for review of PFTs and CT scan.  States that dyspnea is stable.  Outpatient Encounter Medications as of 04/21/2021  Medication Sig   acetaminophen (TYLENOL) 325 MG tablet Take 950 mg by mouth 2 (two) times  daily.   ALPRAZolam (XANAX) 1 MG tablet Take 1 tablet (1 mg total) by mouth 4 (four) times daily as needed for anxiety.   amoxicillin-clavulanate (AUGMENTIN) 875-125 MG tablet 1 tablet by mouth twice daily   buprenorphine (SUBUTEX) 8 MG SUBL SL tablet Place 16 mg under the tongue daily.   clobetasol (TEMOVATE) 0.05 % external solution APPLY EXTERNALLY TO THE AFFECTED AREA TWICE DAILY AS NEEDED.   Colloidal Oatmeal (GOLD BOND ECZEMA RELIEF) 2 % CREA Apply 1 application topically as needed (psoriasis).   Denture Care Products (DENTURE TABLETS) TBEF 1-2 tablets by Other route as needed (for denture cleaning). Fixodent   Denture Care Products (SUPER POLIGRIP) CREA Place 1 application onto teeth as needed (denture fixation).   DEXILANT 60 MG capsule TAKE 1 CAPSULE BY MOUTH ONCE DAILY.   docusate sodium (COLACE) 100 MG capsule Take 100 mg by mouth as needed for mild constipation.   famotidine (PEPCID) 40 MG tablet TAKE 1 TABLET ONCE DAILY.   fluticasone (FLONASE) 50 MCG/ACT nasal spray USE (2) SPRAYS IN EACH NOSTRIL ONCE DAILY.   fluticasone (FLOVENT HFA) 110 MCG/ACT inhaler INHALE 1 PUFF TWICE A DAY FOR ASTHMA.   furosemide (LASIX) 20 MG tablet TAKE 1/2 TO 1 TABLET IN THE MORNING AS NEEDED FOR SWELLING IN LEGS.   gabapentin (NEURONTIN) 300 MG capsule TAKE 3 CAPSULES THREE TIMES DAILY.   ibuprofen (ADVIL) 200 MG tablet Take 600 mg by mouth 2 (two) times daily.   ketoconazole (NIZORAL) 2 % cream Apply 1 application topically 2 (two) times daily. Apply thin amount under fold of breast bilateral twice daily as  needed (Patient taking differently: Apply 1 application topically 2 (two) times daily as needed (yeast).)   LINZESS 290 MCG CAPS capsule TAKE 1 CAPSULE BEFORE BREAKFAST.   metoprolol tartrate (LOPRESSOR) 25 MG tablet TAKE (1/2) TABLET BY MOUTH 2 TIMES A DAY.   metoprolol tartrate (LOPRESSOR) 25 MG tablet Take 1 tablet by mouth in the morning and 1/2 tablet by mouth in evening.    naphazoline-pheniramine (ALLERGY EYE) 0.025-0.3 % ophthalmic solution Place 1-2 drops into both eyes 4 (four) times daily as needed for eye irritation.   ondansetron (ZOFRAN) 8 MG tablet Take 1 tablet (8 mg total) by mouth every 8 (eight) hours as needed for nausea.   pantoprazole (PROTONIX) 40 MG tablet Take 40 mg by mouth 2 (two) times daily as needed (acid reflux).   potassium chloride (KLOR-CON) 10 MEQ tablet Take 1 tablet (10 mEq total) by mouth 2 (two) times daily. (Patient taking differently: Take 10 mEq by mouth 2 (two) times daily as needed (cramps).)   VENTOLIN HFA 108 (90 Base) MCG/ACT inhaler INHALE 2 PUFFS INTO THE LUNGS EVERY 6 HOURS AS NEEDED FOR WHEEZING.   No facility-administered encounter medications on file as of 04/21/2021.   Physical Exam: Blood pressure 122/62, pulse 71, height 5' 3"  (1.6 m), weight 257 lb 9.6 oz (116.8 kg), SpO2 94 %. Gen:      No acute distress HEENT:  EOMI, sclera anicteric Neck:     No masses; no thyromegaly Lungs:    Clear to auscultation bilaterally; normal respiratory effort CV:         Regular rate and rhythm; no murmurs Abd:      + bowel sounds; soft, non-tender; no palpable masses, no distension Ext:    No edema; adequate peripheral perfusion Skin:      Warm and dry; no rash Neuro: alert and oriented x 3 Psych: normal mood and affect   Data Reviewed: Imaging: CTA 05/06/2020-no pulmonary embolism, dense consolidation in the left  Left lower lobe, streaky opacities in the lingula and lower lobes.  I have reviewed the images personally.  MRI abdomen 02/17/2021-mild edema concerning for acute pancreatitis, bilateral pulmonary interstitial and airspace disease, gallstones.  High-resolution CT 04/07/2021-linear scarring in the upper lobes.  Mild air trapping.  No clear evidence of interstitial lung disease. I have reviewed the images personally.  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  I suspect this was an acute process as she had flu infection just prior to that.  Her follow-up high-res CT does not show any significant interstitial lung disease.  She has mild linear scarring in the upper lobes which do not appear significant  PFTs reviewed with severe restriction.  However her FVC and diffusion capacity is normal.  I suspect the restriction is secondary to body habitus and obesity as ERV is reduced as well.  She is planning to proceed with disability paperwork which is appropriate. She wants to go on weight loss pills and I do not see any pulmonary contraindication to do so.  She will discuss this further with her primary care  We could not walk her today to assess oxygenation due to mobility issues  Peri-operative Assessment of Pulmonary Risk for Non-Thoracic Surgery: Has cholecystectomy for chronic cholecystitis planned by Dr. Johney Maine Overall she has adequate pulmonary function with no evidence of post-COVID interstitial  lung disease.  PFTs as noted above show restriction which is secondary to body habitus and not an intrinsic pulmonary issue  Overall she is at low risk for perioperative complication and no contraindication for surgery  Respiratory complications generally occur in 1% of ASA Class I patients, 5% of ASA Class II and 10% of ASA Class III-IV patients These complications rarely result in mortality and iclude postoperative pneumonia, atelectasis, pulmonary embolism, ARDS and increased time requiring postoperative mechanical ventilation.  Overall, I recommend proceeding with the surgery if the risk for respiratory complications are outweighed by the potential benefits. This will need to be discussed between the patient and surgeon.  To reduce risks of respiratory complications, I recommend: --Pre- and post-operative  incentive spirometry performed frequently while awake --Avoiding use of pancuronium during anesthesia.  I have discussed the risk factors and recommendations above with the patient.   Psoriasis, skin rash Dermatology evaluation is pending  Plan/Recommendations: Okay to proceed with planned cholecystectomy Advised weight loss, exercise as possible Follow-up in 6 months  Marshell Garfinkel MD [facility] 04/21/2021, 12:27 PM  CC: Kathyrn Drown, MD

## 2021-04-21 NOTE — Patient Instructions (Signed)
I reviewed the CT scan and lung function test which does not show significant lung abnormalities. We will work on weight loss I will get in touch with your surgeon and give clearance for surgery Return to clinic in 6 months

## 2021-04-21 NOTE — Progress Notes (Signed)
PFT done today. 

## 2021-04-30 ENCOUNTER — Ambulatory Visit (INDEPENDENT_AMBULATORY_CARE_PROVIDER_SITE_OTHER): Payer: Medicaid Other | Admitting: Family Medicine

## 2021-04-30 ENCOUNTER — Other Ambulatory Visit: Payer: Self-pay

## 2021-04-30 DIAGNOSIS — F09 Unspecified mental disorder due to known physiological condition: Secondary | ICD-10-CM

## 2021-04-30 DIAGNOSIS — U099 Post covid-19 condition, unspecified: Secondary | ICD-10-CM

## 2021-04-30 DIAGNOSIS — E221 Hyperprolactinemia: Secondary | ICD-10-CM

## 2021-04-30 DIAGNOSIS — E781 Pure hyperglyceridemia: Secondary | ICD-10-CM | POA: Diagnosis not present

## 2021-04-30 DIAGNOSIS — F411 Generalized anxiety disorder: Secondary | ICD-10-CM | POA: Diagnosis not present

## 2021-04-30 DIAGNOSIS — R7989 Other specified abnormal findings of blood chemistry: Secondary | ICD-10-CM

## 2021-04-30 MED ORDER — ALPRAZOLAM 1 MG PO TABS: 1.0000 mg | ORAL_TABLET | Freq: Four times a day (QID) | ORAL | 4 refills | Status: DC | PRN

## 2021-04-30 MED ORDER — METOPROLOL TARTRATE 25 MG PO TABS: ORAL_TABLET | ORAL | 6 refills | Status: DC

## 2021-04-30 NOTE — Progress Notes (Signed)
? ?Subjective:  ? ? Patient ID: Belinda [name], female    DOB: 1981/01/22, 41 y.o.   MRN: [id number] ? ?HPI ? ?Patient want's to discuss diet pill.  She said that she talked with pulmonary doctor. He said it would be ok.  ?We did discuss how dietary medications could cause her blood pressure go up because her heart rate go up which would not be good so we recommended not doing this ? ?Patient will be having gallbladder surgery on 05/21/21.  This will help the patient's nausea and intermittent abdominal pain ? ?Patient will be seeing ENT to have "gunk" sucked out of her face from [place]. ? ? ?Patient wants to go up on Lopressor, 1 tablet Twice a day.  Currently she does 1 in the morning half in the evening and states when she gets up moves around her heart rate goes well fast ? ?This patient has some chronic health issues ?Including severe anxiety with panic attacks she is on Xanax multiple times per day has gone through counseling in the past without success.  Finds himself feeling anxious every single day. ? ?She also has opioid use disorder and is on subutex and doing a good job of staying away from illicit drugs ? ?She also suffered a severe bout of COVID she was admitted in December 2021 discharged from the hospital in January 2022 then admitted in rehab and spent approximately 6 weeks there ?The patient had acute respiratory failure ?COVID-pneumonia ?ARDS ?Acute metabolic encephalopathy ?Patient also had tracheostomy ?Ended up with severe weakness of both legs and weakness of arms and was essentially wheelchair-bound for quite some time ? ?She can now walk with a walker for limited range of 10 to 15 feet if it is more than that she uses a wheelchair because of significant weakness in her legs as well as back pain.  She has right foot drop.  She also has mild weakness in both arms but more profound on the right. ? ?Patient also relates she has difficult time focusing.  She can only read briefly has a difficult  time with comprehension.  States that she often forgets words.  This is been very profound since her prolonged hospital stay.  She also relates that she cannot do finances her husband has to do those now.  She has a difficult time doing any type of complicated food prep because she cannot process the steps. ? ?She also relates a profound amount of fatigue and tiredness in her arms and legs when she tries to stand or move she finds himself giving out she can only stand for 2 to 3 minutes at a time without having to lean on something or support herself with a walker.  She is unable to do anything where it requires standing in place she has to sit frequently ? ?She states when she sits she has to move frequently because of pain in her lower back and legs and often moving to the right hip and the left hip sometimes standing briefly sometimes laying down states he has to take several breaks throughout every morning and evening by having to lay down on the recliner in order to get some relief ? ?She states she has suffered with some falls because of weakness in her legs ? ?She was seen by neurology had extensive MRI of the brain cervical spine lumbar spine thoracic spine did not show any demyelinating conditions. ? ?She does suffer with some mild cognitive dysfunction related to post-COVID ?Review  of Systems ? ?   ?Objective:  ? Physical Exam ?Patient very anxious ?Talkative ?Appropriate in her behavior otherwise ?Lungs are clear no crackles heart tachycardic ?Pulse is regular but tachycardic ?Extremities does have some pedal edema on both sides mainly from inactivity ?Foot drop on the right leg ?Quadricep weakness bilateral 3/4 ?Hip abductor 3 out of 4 both sides worse on the right side ?Hamstring 3 out of 4 on the right 3+ out of 4 on the left ?Interosseous muscle weakness more on the right side than the left sidebicep weakness on the right side 3+/4 on the left side 3+/4 ? ?Patient is wheelchair-bound.  Uses a  wheelchair to get into the office.  Can only stand briefly with a walker. ? ? ? ?   ?Assessment & Plan:  ?1. Generalized anxiety disorder ?Continue her Xanax she has been on this for years I do not feel that this is contributing to her problem I told her she can take a maximum of 4/day.  We have tapered her down from 120 tablets down to 98 tablets I believe that is the best work-up to be able to do for right now ?- ALPRAZolam (XANAX) 1 MG tablet; Take 1 tablet (1 mg total) by mouth 4 (four) times daily as needed for anxiety.  Dispense: 98 tablet; Refill: 4 ?- Basic Metabolic Panel (BMET) ?- Lipid Profile ?- TSH ? ?2. Hypertriglyceridemia ?Check cholesterol profile healthy eating regular activity recommended ?- Basic Metabolic Panel (BMET) ?- Lipid Profile ?- TSH ? ?3. Elevated TSH ?Previous elevated TSH will repeat test to see if this is developing hypothyroidism ?- Basic Metabolic Panel (BMET) ?- Lipid Profile ?- TSH ? ?4. Hyperprolactinemia ([place]) ?Patient had elevated prolactin level MRI showed stable area of the pituitary gland but if repeat prolactin level still elevated referral to endocrinology for further work-up of any micro adenoma ? ?5. Post-COVID syndrome ?Significant post-COVID syndrome with nerve damage with right foot drop as well as weakness in both legs and weakness in the arms more profound on the right side than the left side.  Also significant continuous fatigue tiredness and weakness which limits her ability to stand and sit and causes her to ambulate frequently.  She does have a lot of weakness in her arms and she relates a lot of fatigue and tiredness when she tries to use her hands has difficult time holding pots or other type objects ? ?6. Cognitive dysfunction ?Patient does have cognitive dysfunction difficulty focusing and following through processing numbers this inhibits her ability to follow detailed directions and would make it difficult for her to do any type of job that requires  cognitive skills ? ?Given her severe case of COVID as well as post-COVID syndrome as well as the fact that she went through extensive physical therapy but still not able to walk independently and also having to change positions frequently such as having to sit frequently and lay back in a recliner frequently it would be extremely difficult for her to work a standard job.  I do not believe a standard job would be able to provide her accommodations for these position changes.  They would not be able to provide good accommodations for her fatigue and weakness and she would miss multiple days per month probably in the range of multiple days per week because of this post COVID syndrome.  In my opinion she is disabled.  I believe this is permanent. ? ?She will follow-up here within 3 months ?

## 2021-04-30 NOTE — Telephone Encounter (Signed)
OV notes and clearance form have been faxed back to [facility]. Nothing further needed. ?

## 2021-05-01 LAB — BASIC METABOLIC PANEL
BUN/Creatinine Ratio: 13 (ref 9–23)
BUN: 11 mg/dL (ref 6–24)
CO2: 22 mmol/L (ref 20–29)
Calcium: 8.9 mg/dL (ref 8.7–10.2)
Chloride: 101 mmol/L (ref 96–106)
Creatinine, Ser: 0.84 mg/dL (ref 0.57–1.00)
Glucose: 79 mg/dL (ref 70–99)
Potassium: 4.1 mmol/L (ref 3.5–5.2)
Sodium: 139 mmol/L (ref 134–144)
eGFR: 90 mL/min/{1.73_m2} (ref >59)

## 2021-05-01 LAB — LIPID PANEL
Chol/HDL Ratio: 4.8 ratio — ABNORMAL HIGH (ref 0.0–4.4)
Cholesterol, Total: 190 mg/dL (ref 100–199)
HDL: 40 mg/dL (ref >39)
LDL Chol Calc (NIH): 124 mg/dL — ABNORMAL HIGH (ref 0–99)
Triglycerides: 143 mg/dL (ref 0–149)
VLDL Cholesterol Cal: 26 mg/dL (ref 5–40)

## 2021-05-01 LAB — SPECIMEN STATUS REPORT

## 2021-05-01 LAB — TSH: TSH: 2.3 u[IU]/mL (ref 0.450–4.500)

## 2021-05-01 NOTE — Progress Notes (Signed)
[facility] notified to add on prolactin test.  ?

## 2021-05-05 ENCOUNTER — Other Ambulatory Visit: Payer: Self-pay | Admitting: Family Medicine

## 2021-05-06 ENCOUNTER — Ambulatory Visit: Payer: Medicaid Other | Admitting: Family Medicine

## 2021-05-07 ENCOUNTER — Telehealth: Payer: Self-pay | Admitting: *Deleted

## 2021-05-07 LAB — PROLACTIN: Prolactin: 18.1 ng/mL (ref 4.8–23.3)

## 2021-05-07 LAB — SPECIMEN STATUS REPORT

## 2021-05-07 NOTE — Telephone Encounter (Signed)
Patient calling about the letter you were writing for her legal team. Her legal team called her today and stated that they never received the physician letter or her records for her disability appeal. The appeal must be filed by 05/27/21 ?

## 2021-05-12 ENCOUNTER — Encounter: Payer: Self-pay | Admitting: Family Medicine

## 2021-05-12 ENCOUNTER — Telehealth: Payer: Self-pay

## 2021-05-12 NOTE — Telephone Encounter (Signed)
A letter was completed regarding her disability.  This letter supports her disability.  She may have a copy of this letter.  Please make sure that the letter is also forwarded to her lawyers with [employer] and [employer]ancel [employer]ale.  This form is being forwarded back to the nurses ?Also please talk with Hoyle Sauer regarding if they need medical records-if so proper channels must be used for that ? ?Patient may have a copy of the letter as well as a copy of her most recent visit with myself on April 30, 2021 ?

## 2021-05-12 NOTE — Telephone Encounter (Signed)
This will be ready for pickup on Friday ?

## 2021-05-12 NOTE — Telephone Encounter (Signed)
Pt called in to inquire about ppw that pcp needed to complete for an appeal case for pt. Please call pt back if this ppw has been completed and faxed. Please advise. ? ?Cb#: [phone] ?

## 2021-05-13 ENCOUNTER — Telehealth: Payer: Self-pay | Admitting: Family Medicine

## 2021-05-13 NOTE — Telephone Encounter (Signed)
Hi Erica ?Thanks for getting this performed in documentation to the lawyer as requested ?Please give him a call regarding this so that her worry regarding this issue will be lessened thank you ?

## 2021-05-14 NOTE — Telephone Encounter (Signed)
See message 05/13/21 ?

## 2021-05-22 ENCOUNTER — Other Ambulatory Visit: Payer: Self-pay | Admitting: Family Medicine

## 2021-06-02 ENCOUNTER — Encounter (HOSPITAL_BASED_OUTPATIENT_CLINIC_OR_DEPARTMENT_OTHER): Payer: Self-pay | Admitting: Surgery

## 2021-06-02 DIAGNOSIS — Z029 Encounter for administrative examinations, unspecified: Secondary | ICD-10-CM

## 2021-06-03 ENCOUNTER — Other Ambulatory Visit: Payer: Self-pay | Admitting: Family Medicine

## 2021-06-04 ENCOUNTER — Telehealth: Payer: Self-pay | Admitting: Family Medicine

## 2021-06-04 MED ORDER — ONDANSETRON HCL 8 MG PO TABS: ORAL_TABLET | ORAL | 3 refills | Status: DC

## 2021-06-04 NOTE — Telephone Encounter (Signed)
Left message on voice mail (with pt identifying herself) about the change in the Zofran, if any questions please call the office. ?

## 2021-06-04 NOTE — Telephone Encounter (Signed)
Patient is requesting a new prescription for Zofran 8 mg she states having a lot of nausea. Its not on her current medications  list also she would like it increased to from 15 pills to 30 pills.[place] ?

## 2021-06-04 NOTE — Progress Notes (Signed)
Reviewed patient history  and pulmonary clearance 04-21-2021 dr Penne Lash with  dr c Christella Hartigan, mda, pt meets [facility] guidelines  per dr c Christella Hartigan mda. ?

## 2021-06-04 NOTE — Telephone Encounter (Signed)
Zofran 8 mg Zofran 8 mg 1 taken 3 times daily as needed for nausea #20 with 3 refills ?(In my experience there are quantity limits for this medicine) ?

## 2021-06-09 ENCOUNTER — Encounter (HOSPITAL_BASED_OUTPATIENT_CLINIC_OR_DEPARTMENT_OTHER): Payer: Self-pay | Admitting: Surgery

## 2021-06-09 ENCOUNTER — Other Ambulatory Visit: Payer: Self-pay

## 2021-06-09 NOTE — Anesthesia Preprocedure Evaluation (Addendum)
Anesthesia Evaluation  ?Patient identified by MRN, date of birth, ID band ?Patient awake ? ? ? ?Reviewed: ?Allergy & Precautions, NPO status , Patient's Chart, lab work & pertinent test results ? ?History of Anesthesia Complications ?Negative for: history of anesthetic complications ? ?Airway ?Mallampati: II ? ?TM Distance: >3 FB ?Neck ROM: Full ? ? ? Dental ? ?(+) Upper Dentures, Dental Advisory Given ?  ?Pulmonary ?Current Smoker, former smoker,  ?Prolonged hospitalization for COVID-19 in Dec 2021 requiring intubation and eventually tracheostomy (decannulated 03/16/20). Pulmonology evaluation showed no evidence of intrinsic lung disease. She was cleared for surgery by Pulmonology stating: "Overall she is at low risk for perioperative complication and no contraindication for surgery." ?  ?Pulmonary exam normal ? ? ? ? ? ? ? Cardiovascular ?negative cardio ROS ?Normal cardiovascular exam ? ? ?Echo 02/12/20: EF 55-60%, no RWMA, normal diastolic fn, normal RVSF, no hemodynamically significant valvular disease ?  ?Neuro/Psych ?Anxiety Bipolar Disorder negative neurological ROS ?   ? GI/Hepatic ?GERD  ,(+)  ?  ? substance abuse ? , Chronic cholecystitis ?  ?Endo/Other  ?Morbid obesity (BMI 45) ? Renal/GU ?negative Renal ROS  ?negative genitourinary ?  ?Musculoskeletal ?negative musculoskeletal ROS ?(+) narcotic dependent ? Abdominal ?  ?Peds ? Hematology ?negative hematology ROS ?(+)   ?Anesthesia Other Findings ?Subutex ? Reproductive/Obstetrics ? ?  ? ? ? ? ? ? ? ? ? ? ? ? ? ?  ?  ? ? ? ? ? ? ? ?Anesthesia Physical ?Anesthesia Plan ? ?ASA: 3 ? ?Anesthesia Plan: General  ? ?Post-op Pain Management: Tylenol PO (pre-op)*  ? ?Induction: Intravenous ? ?PONV Risk Score and Plan: 4 or greater and Ondansetron, Dexamethasone, Treatment may vary due to age or medical condition and Midazolam ? ?Airway Management Planned: Oral ETT ? ?Additional Equipment: None ? ?Intra-op Plan:  ? ?Post-operative  Plan: Extubation in OR ? ?Informed Consent: I have reviewed the patients History and Physical, chart, labs and discussed the procedure including the risks, benefits and alternatives for the proposed anesthesia with the patient or authorized representative who has indicated his/her understanding and acceptance.  ? ? ? ?Dental advisory given ? ?Plan Discussed with:  ? ?Anesthesia Plan Comments:   ? ? ? ? ? ?Anesthesia Quick Evaluation ? ?

## 2021-06-09 NOTE — Progress Notes (Signed)
Spoke w/ via phone for pre-op interview--- pt ?Lab needs dos----  cmp, urine preg             ?Lab results------ current chest ct/ ekg in epic/ chart ?COVID test -----patient states asymptomatic no test needed ?Arrive at ------- 0630 on 06-10-2021 ?NPO after MN NO Solid Food.  Clear liquids from MN until--- 0530 ?Med rec completed ?Medications to take morning of surgery ----- dexilant, gabapentin, linzess, lopressor, xanax, flonase spray, flovent inhaler ?Diabetic medication ----- n/a ?Patient instructed no nail polish to be worn day of surgery ?Patient instructed to bring photo id and insurance card day of surgery ?Patient aware to have Driver (ride ) / caregiver  for 24 hours after surgery -- sig other, kevin ?Patient Special Instructions ----- reviewed [facility] and visitor guidelines. ?Asked to bring inhaler's dos. ? ?Pre-Op special Istructions ----- pt has pulmonologist office visit note clearance by Dr Bernita Raisin dated 04-21-2021 in epic/ chart ?Pt uses wheelchair, can stand w/ minimal assistant to stretcher. ? ?Patient verbalized understanding of instructions that were given at this phone interview. ?Patient denies chest pain, fever, cough at this phone interview.  ? ? ?Anesthesia Review:  palpitations; asthma; prolonged admission 02-06-2020 for COVID w/ respiratory failure;  post COVID syndrome with post residual DOE, chronic fatigue, bilateral weakness all extremities, chronic pain (takes subutex);  Bipolar/ PTSD// ? Pt is highly highly anxious. ?Chart reviewed by Dr Thayer Ohm , refer to progress note by Michele Rockers on 06-04-2021, stated meets [facility] guidelines. ? ? ?

## 2021-06-10 ENCOUNTER — Ambulatory Visit (HOSPITAL_BASED_OUTPATIENT_CLINIC_OR_DEPARTMENT_OTHER): Payer: Medicaid Other | Admitting: Anesthesiology

## 2021-06-10 ENCOUNTER — Encounter (HOSPITAL_COMMUNITY)
Admission: RE | Disposition: A | Discharge status: Home / Self Care | Payer: Self-pay | Source: Home / Self Care | Attending: Surgery

## 2021-06-10 ENCOUNTER — Encounter (HOSPITAL_BASED_OUTPATIENT_CLINIC_OR_DEPARTMENT_OTHER): Payer: Self-pay | Admitting: Surgery

## 2021-06-10 ENCOUNTER — Other Ambulatory Visit: Payer: Self-pay

## 2021-06-10 ENCOUNTER — Ambulatory Visit (HOSPITAL_COMMUNITY): Payer: Medicaid Other

## 2021-06-10 ENCOUNTER — Inpatient Hospital Stay (HOSPITAL_BASED_OUTPATIENT_CLINIC_OR_DEPARTMENT_OTHER)
Admission: RE | Admit: 2021-06-10 | Discharge: 2021-06-12 | DRG: 418 | Disposition: A | Discharge status: Home / Self Care | Payer: Medicaid Other | Attending: Surgery | Admitting: Surgery

## 2021-06-10 DIAGNOSIS — R269 Unspecified abnormalities of gait and mobility: Secondary | ICD-10-CM

## 2021-06-10 DIAGNOSIS — K7581 Nonalcoholic steatohepatitis (NASH): Secondary | ICD-10-CM | POA: Diagnosis not present

## 2021-06-10 DIAGNOSIS — R1310 Dysphagia, unspecified: Secondary | ICD-10-CM | POA: Diagnosis present

## 2021-06-10 DIAGNOSIS — F410 Panic disorder [episodic paroxysmal anxiety] without agoraphobia: Secondary | ICD-10-CM | POA: Diagnosis present

## 2021-06-10 DIAGNOSIS — Z881 Allergy status to other antibiotic agents status: Secondary | ICD-10-CM

## 2021-06-10 DIAGNOSIS — Z6841 Body Mass Index (BMI) 40.0 and over, adult: Secondary | ICD-10-CM | POA: Diagnosis not present

## 2021-06-10 DIAGNOSIS — K5909 Other constipation: Secondary | ICD-10-CM | POA: Diagnosis present

## 2021-06-10 DIAGNOSIS — R0609 Other forms of dyspnea: Secondary | ICD-10-CM | POA: Diagnosis present

## 2021-06-10 DIAGNOSIS — R5381 Other malaise: Principal | ICD-10-CM

## 2021-06-10 DIAGNOSIS — M21371 Foot drop, right foot: Secondary | ICD-10-CM | POA: Diagnosis present

## 2021-06-10 DIAGNOSIS — F17210 Nicotine dependence, cigarettes, uncomplicated: Secondary | ICD-10-CM | POA: Diagnosis present

## 2021-06-10 DIAGNOSIS — G4730 Sleep apnea, unspecified: Secondary | ICD-10-CM | POA: Diagnosis present

## 2021-06-10 DIAGNOSIS — K8010 Calculus of gallbladder with chronic cholecystitis without obstruction: Principal | ICD-10-CM | POA: Diagnosis present

## 2021-06-10 DIAGNOSIS — Z8616 Personal history of COVID-19: Secondary | ICD-10-CM

## 2021-06-10 DIAGNOSIS — G894 Chronic pain syndrome: Secondary | ICD-10-CM | POA: Diagnosis present

## 2021-06-10 DIAGNOSIS — F429 Obsessive-compulsive disorder, unspecified: Secondary | ICD-10-CM | POA: Diagnosis present

## 2021-06-10 DIAGNOSIS — Z79899 Other long term (current) drug therapy: Secondary | ICD-10-CM

## 2021-06-10 DIAGNOSIS — R0902 Hypoxemia: Secondary | ICD-10-CM | POA: Diagnosis not present

## 2021-06-10 DIAGNOSIS — G9340 Encephalopathy, unspecified: Secondary | ICD-10-CM

## 2021-06-10 DIAGNOSIS — R0602 Shortness of breath: Secondary | ICD-10-CM | POA: Diagnosis present

## 2021-06-10 DIAGNOSIS — Z886 Allergy status to analgesic agent status: Secondary | ICD-10-CM

## 2021-06-10 DIAGNOSIS — Z635 Disruption of family by separation and divorce: Secondary | ICD-10-CM

## 2021-06-10 DIAGNOSIS — Z888 Allergy status to other drugs, medicaments and biological substances status: Secondary | ICD-10-CM

## 2021-06-10 DIAGNOSIS — F411 Generalized anxiety disorder: Secondary | ICD-10-CM | POA: Diagnosis present

## 2021-06-10 DIAGNOSIS — F1910 Other psychoactive substance abuse, uncomplicated: Secondary | ICD-10-CM | POA: Diagnosis present

## 2021-06-10 DIAGNOSIS — R748 Abnormal levels of other serum enzymes: Principal | ICD-10-CM

## 2021-06-10 DIAGNOSIS — K219 Gastro-esophageal reflux disease without esophagitis: Secondary | ICD-10-CM | POA: Diagnosis present

## 2021-06-10 DIAGNOSIS — Z993 Dependence on wheelchair: Secondary | ICD-10-CM

## 2021-06-10 DIAGNOSIS — R29898 Other symptoms and signs involving the musculoskeletal system: Secondary | ICD-10-CM

## 2021-06-10 HISTORY — DX: Localized edema: R60.0

## 2021-06-10 HISTORY — DX: Palpitations: R00.2

## 2021-06-10 HISTORY — DX: Personal history of other mental and behavioral disorders: Z86.59

## 2021-06-10 HISTORY — DX: Panic disorder (episodic paroxysmal anxiety): F41.0

## 2021-06-10 HISTORY — DX: Dependence on wheelchair: Z99.3

## 2021-06-10 HISTORY — DX: Chronic cholecystitis: K81.1

## 2021-06-10 HISTORY — DX: Unspecified urinary incontinence: R32

## 2021-06-10 HISTORY — DX: Other forms of dyspnea: R06.09

## 2021-06-10 HISTORY — DX: Personal history of other diseases of the female genital tract: Z87.42

## 2021-06-10 HISTORY — DX: Chronic fatigue, unspecified: R53.82

## 2021-06-10 HISTORY — DX: Presence of dental prosthetic device (complete) (partial): Z97.2

## 2021-06-10 HISTORY — PX: CHOLECYSTECTOMY: SHX55

## 2021-06-10 HISTORY — DX: Post-traumatic stress disorder, unspecified: F43.10

## 2021-06-10 HISTORY — DX: Polyneuropathy, unspecified: G62.9

## 2021-06-10 HISTORY — DX: Unspecified mental disorder due to known physiological condition: F09

## 2021-06-10 HISTORY — PX: LIVER BIOPSY: SHX301

## 2021-06-10 HISTORY — DX: Opioid use, unspecified, uncomplicated: F11.90

## 2021-06-10 HISTORY — DX: Other chronic pain: G89.29

## 2021-06-10 HISTORY — DX: Unspecified abnormalities of gait and mobility: R26.9

## 2021-06-10 HISTORY — DX: Other symptoms and signs involving the musculoskeletal system: R29.898

## 2021-06-10 HISTORY — DX: Other constipation: K59.09

## 2021-06-10 LAB — COMPREHENSIVE METABOLIC PANEL
ALT: 85 U/L — ABNORMAL HIGH (ref 0–44)
AST: 71 U/L — ABNORMAL HIGH (ref 15–41)
Albumin: 4.3 g/dL (ref 3.5–5.0)
Alkaline Phosphatase: 79 U/L (ref 38–126)
Anion gap: 7 (ref 5–15)
BUN: 11 mg/dL (ref 6–20)
CO2: 24 mmol/L (ref 22–32)
Calcium: 8.5 mg/dL — ABNORMAL LOW (ref 8.9–10.3)
Chloride: 106 mmol/L (ref 98–111)
Creatinine, Ser: 0.88 mg/dL (ref 0.44–1.00)
GFR, Estimated: >60 mL/min (ref >60)
Glucose, Bld: 97 mg/dL (ref 70–99)
Potassium: 3.9 mmol/L (ref 3.5–5.1)
Sodium: 137 mmol/L (ref 135–145)
Total Bilirubin: 1 mg/dL (ref 0.3–1.2)
Total Protein: 7.8 g/dL (ref 6.5–8.1)

## 2021-06-10 LAB — POCT PREGNANCY, URINE: Preg Test, Ur: NEGATIVE

## 2021-06-10 SURGERY — LAPAROSCOPIC CHOLECYSTECTOMY WITH INTRAOPERATIVE CHOLANGIOGRAM
Anesthesia: General | Site: Abdomen

## 2021-06-10 MED ORDER — DEXMEDETOMIDINE (PRECEDEX) IN NS 20 MCG/5ML (4 MCG/ML) IV SYRINGE
PREFILLED_SYRINGE | INTRAVENOUS | Status: DC | PRN
  Administered 2021-06-10: 12 ug via INTRAVENOUS
  Administered 2021-06-10 (×2): 4 ug via INTRAVENOUS
  Administered 2021-06-10: 8 ug via INTRAVENOUS

## 2021-06-10 MED ORDER — KETAMINE HCL 50 MG/5ML IJ SOSY
PREFILLED_SYRINGE | INTRAMUSCULAR | Status: AC
  Filled 2021-06-10: qty 5

## 2021-06-10 MED ORDER — BUPIVACAINE LIPOSOME 1.3 % IJ SUSP: 20.0000 mL | Freq: Once | INTRAMUSCULAR | Status: DC

## 2021-06-10 MED ORDER — LACTATED RINGERS IV BOLUS
1000.0000 mL | Freq: Three times a day (TID) | INTRAVENOUS | Status: AC | PRN
Start: 2021-06-10 — End: 2021-06-12

## 2021-06-10 MED ORDER — PROCHLORPERAZINE EDISYLATE 10 MG/2ML IJ SOLN: 5.0000 mg | Freq: Four times a day (QID) | INTRAMUSCULAR | Status: DC | PRN

## 2021-06-10 MED ORDER — SIMETHICONE 80 MG PO CHEW
40.0000 mg | CHEWABLE_TABLET | Freq: Four times a day (QID) | ORAL | Status: DC | PRN
  Administered 2021-06-10 – 2021-06-12 (×3): 40 mg via ORAL
  Filled 2021-06-10 (×2): qty 1

## 2021-06-10 MED ORDER — METOCLOPRAMIDE HCL 5 MG/ML IJ SOLN
5.0000 mg | Freq: Three times a day (TID) | INTRAMUSCULAR | Status: DC | PRN
  Administered 2021-06-10: 10 mg via INTRAVENOUS
  Filled 2021-06-10: qty 2

## 2021-06-10 MED ORDER — CLOBETASOL PROPIONATE 0.05 % EX CREA
TOPICAL_CREAM | Freq: Two times a day (BID) | CUTANEOUS | Status: DC
  Filled 2021-06-10 (×3): qty 15

## 2021-06-10 MED ORDER — ACETAMINOPHEN 500 MG PO TABS
1000.0000 mg | ORAL_TABLET | ORAL | Status: AC
  Administered 2021-06-10: 1000 mg via ORAL

## 2021-06-10 MED ORDER — ENSURE PRE-SURGERY PO LIQD: 296.0000 mL | Freq: Once | ORAL | Status: DC

## 2021-06-10 MED ORDER — LIP MEDEX EX OINT
1.0000 | TOPICAL_OINTMENT | Freq: Two times a day (BID) | CUTANEOUS | Status: DC
Start: 2021-06-10 — End: 2021-06-12
  Administered 2021-06-10 – 2021-06-12 (×5): 1 via TOPICAL
  Filled 2021-06-10: qty 7

## 2021-06-10 MED ORDER — TRAMADOL HCL 50 MG PO TABS
50.0000 mg | ORAL_TABLET | Freq: Four times a day (QID) | ORAL | Status: DC | PRN
  Administered 2021-06-12 (×3): 100 mg via ORAL
  Filled 2021-06-10 (×3): qty 2

## 2021-06-10 MED ORDER — AVEENO SOOTHING BATH TREATMENT EX PACK: 1.0000 | PACK | CUTANEOUS | Status: DC | PRN

## 2021-06-10 MED ORDER — GLYCOPYRROLATE 0.2 MG/ML IJ SOLN
INTRAMUSCULAR | Status: DC | PRN
Start: 2021-06-10 — End: 2021-06-10
  Administered 2021-06-10 (×2): .1 mg via INTRAVENOUS

## 2021-06-10 MED ORDER — ACETAMINOPHEN 500 MG PO TABS
1000.0000 mg | ORAL_TABLET | Freq: Four times a day (QID) | ORAL | Status: DC
  Administered 2021-06-10 – 2021-06-12 (×7): 1000 mg via ORAL
  Filled 2021-06-10 (×8): qty 2

## 2021-06-10 MED ORDER — OXYCODONE HCL 5 MG/5ML PO SOLN: 5.0000 mg | Freq: Once | ORAL | Status: AC | PRN

## 2021-06-10 MED ORDER — FUROSEMIDE 10 MG/ML IJ SOLN
40.0000 mg | Freq: Once | INTRAMUSCULAR | Status: AC
Start: 2021-06-10 — End: 2021-06-10
  Administered 2021-06-10: 40 mg via INTRAVENOUS
  Filled 2021-06-10: qty 4

## 2021-06-10 MED ORDER — SODIUM CHLORIDE 0.9 % IV SOLN: Freq: Three times a day (TID) | INTRAVENOUS | Status: DC | PRN

## 2021-06-10 MED ORDER — AMISULPRIDE (ANTIEMETIC) 5 MG/2ML IV SOLN: 10.0000 mg | Freq: Once | INTRAVENOUS | Status: DC | PRN

## 2021-06-10 MED ORDER — FENTANYL CITRATE (PF) 250 MCG/5ML IJ SOLN
INTRAMUSCULAR | Status: AC
  Filled 2021-06-10: qty 5

## 2021-06-10 MED ORDER — ROCURONIUM BROMIDE 100 MG/10ML IV SOLN
INTRAVENOUS | Status: DC | PRN
  Administered 2021-06-10: 70 mg via INTRAVENOUS
  Administered 2021-06-10: 10 mg via INTRAVENOUS

## 2021-06-10 MED ORDER — DIPHENHYDRAMINE HCL 50 MG/ML IJ SOLN: 12.5000 mg | Freq: Four times a day (QID) | INTRAMUSCULAR | Status: DC | PRN

## 2021-06-10 MED ORDER — CEFTRIAXONE SODIUM 2 G IJ SOLR
INTRAMUSCULAR | Status: AC
  Filled 2021-06-10: qty 20

## 2021-06-10 MED ORDER — BUPRENORPHINE HCL 8 MG SL SUBL
16.0000 mg | SUBLINGUAL_TABLET | Freq: Every day | SUBLINGUAL | Status: DC
  Administered 2021-06-11 – 2021-06-12 (×2): 16 mg via SUBLINGUAL
  Filled 2021-06-10 (×2): qty 2

## 2021-06-10 MED ORDER — MAGNESIUM HYDROXIDE 400 MG/5ML PO SUSP: 30.0000 mL | Freq: Every day | ORAL | Status: DC | PRN

## 2021-06-10 MED ORDER — BISACODYL 10 MG RE SUPP: 10.0000 mg | Freq: Two times a day (BID) | RECTAL | Status: DC | PRN

## 2021-06-10 MED ORDER — ALBUTEROL SULFATE (2.5 MG/3ML) 0.083% IN NEBU: 2.5000 mg | INHALATION_SOLUTION | Freq: Four times a day (QID) | RESPIRATORY_TRACT | Status: DC | PRN

## 2021-06-10 MED ORDER — LACTATED RINGERS IV SOLN: INTRAVENOUS | Status: DC

## 2021-06-10 MED ORDER — GABAPENTIN 300 MG PO CAPS
ORAL_CAPSULE | ORAL | Status: AC
  Filled 2021-06-10: qty 1

## 2021-06-10 MED ORDER — TRAMADOL HCL 50 MG PO TABS: 50.0000 mg | ORAL_TABLET | Freq: Four times a day (QID) | ORAL | 0 refills | Status: DC | PRN

## 2021-06-10 MED ORDER — METHOCARBAMOL 500 MG PO TABS
1000.0000 mg | ORAL_TABLET | Freq: Four times a day (QID) | ORAL | Status: DC | PRN
  Administered 2021-06-10 – 2021-06-12 (×3): 1000 mg via ORAL
  Filled 2021-06-10 (×3): qty 2

## 2021-06-10 MED ORDER — NAPHAZOLINE-PHENIRAMINE 0.025-0.3 % OP SOLN: 1.0000 [drp] | Freq: Four times a day (QID) | OPHTHALMIC | Status: DC | PRN

## 2021-06-10 MED ORDER — FLUTICASONE PROPIONATE HFA 110 MCG/ACT IN AERO: 2.0000 | INHALATION_SPRAY | Freq: Two times a day (BID) | RESPIRATORY_TRACT | Status: DC

## 2021-06-10 MED ORDER — SODIUM CHLORIDE 0.9 % IV SOLN: INTRAVENOUS | Status: DC

## 2021-06-10 MED ORDER — BUPIVACAINE-EPINEPHRINE (PF) 0.25% -1:200000 IJ SOLN
INTRAMUSCULAR | Status: DC | PRN
  Administered 2021-06-10: 50 mL via SURGICAL_CAVITY

## 2021-06-10 MED ORDER — MIDAZOLAM HCL 2 MG/2ML IJ SOLN
INTRAMUSCULAR | Status: AC
  Filled 2021-06-10: qty 2

## 2021-06-10 MED ORDER — ONDANSETRON HCL 4 MG/2ML IJ SOLN
INTRAMUSCULAR | Status: DC | PRN
  Administered 2021-06-10: 4 mg via INTRAVENOUS

## 2021-06-10 MED ORDER — ENOXAPARIN SODIUM 40 MG/0.4ML IJ SOSY
40.0000 mg | PREFILLED_SYRINGE | INTRAMUSCULAR | Status: DC
  Administered 2021-06-11 – 2021-06-12 (×2): 40 mg via SUBCUTANEOUS
  Filled 2021-06-10 (×2): qty 0.4

## 2021-06-10 MED ORDER — BUDESONIDE 0.5 MG/2ML IN SUSP
1.0000 mg | Freq: Two times a day (BID) | RESPIRATORY_TRACT | Status: DC
  Administered 2021-06-10 – 2021-06-12 (×4): 1 mg via RESPIRATORY_TRACT
  Filled 2021-06-10 (×4): qty 4

## 2021-06-10 MED ORDER — PROCHLORPERAZINE MALEATE 10 MG PO TABS
10.0000 mg | ORAL_TABLET | Freq: Four times a day (QID) | ORAL | Status: DC | PRN
  Filled 2021-06-10: qty 1

## 2021-06-10 MED ORDER — METHOCARBAMOL 1000 MG/10ML IJ SOLN
1000.0000 mg | Freq: Four times a day (QID) | INTRAVENOUS | Status: DC | PRN
  Filled 2021-06-10: qty 10

## 2021-06-10 MED ORDER — DIPHENHYDRAMINE HCL 12.5 MG/5ML PO ELIX: 12.5000 mg | ORAL_SOLUTION | Freq: Four times a day (QID) | ORAL | Status: DC | PRN

## 2021-06-10 MED ORDER — FENTANYL CITRATE (PF) 100 MCG/2ML IJ SOLN
INTRAMUSCULAR | Status: AC
  Filled 2021-06-10: qty 2

## 2021-06-10 MED ORDER — SODIUM CHLORIDE 0.9% FLUSH
3.0000 mL | Freq: Two times a day (BID) | INTRAVENOUS | Status: DC
  Administered 2021-06-10 – 2021-06-12 (×4): 3 mL via INTRAVENOUS

## 2021-06-10 MED ORDER — CALCIUM POLYCARBOPHIL 625 MG PO TABS
625.0000 mg | ORAL_TABLET | Freq: Two times a day (BID) | ORAL | Status: DC
  Administered 2021-06-10 – 2021-06-12 (×3): 625 mg via ORAL
  Filled 2021-06-10 (×3): qty 1

## 2021-06-10 MED ORDER — DEXAMETHASONE SODIUM PHOSPHATE 4 MG/ML IJ SOLN
INTRAMUSCULAR | Status: DC | PRN
  Administered 2021-06-10: 8 mg via INTRAVENOUS

## 2021-06-10 MED ORDER — SODIUM CHLORIDE 0.9 % IV SOLN
250.0000 mL | INTRAVENOUS | Status: DC | PRN
Start: 2021-06-10 — End: 2021-06-12

## 2021-06-10 MED ORDER — LIDOCAINE HCL (CARDIAC) PF 100 MG/5ML IV SOSY
PREFILLED_SYRINGE | INTRAVENOUS | Status: DC | PRN
  Administered 2021-06-10: 100 mg via INTRAVENOUS

## 2021-06-10 MED ORDER — CELECOXIB 200 MG PO CAPS
200.0000 mg | ORAL_CAPSULE | ORAL | Status: AC
  Administered 2021-06-10: 200 mg via ORAL

## 2021-06-10 MED ORDER — LINACLOTIDE 145 MCG PO CAPS
290.0000 ug | ORAL_CAPSULE | Freq: Every day | ORAL | Status: DC
  Administered 2021-06-11: 290 ug via ORAL
  Filled 2021-06-10 (×2): qty 2

## 2021-06-10 MED ORDER — POTASSIUM CHLORIDE CRYS ER 10 MEQ PO TBCR
10.0000 meq | EXTENDED_RELEASE_TABLET | Freq: Two times a day (BID) | ORAL | Status: DC
  Administered 2021-06-10 – 2021-06-12 (×4): 10 meq via ORAL
  Filled 2021-06-10 (×4): qty 1

## 2021-06-10 MED ORDER — MIDAZOLAM HCL 2 MG/2ML IJ SOLN
INTRAMUSCULAR | Status: DC | PRN
  Administered 2021-06-10 (×2): 2 mg via INTRAVENOUS

## 2021-06-10 MED ORDER — METOPROLOL TARTRATE 5 MG/5ML IV SOLN: 5.0000 mg | Freq: Four times a day (QID) | INTRAVENOUS | Status: DC | PRN

## 2021-06-10 MED ORDER — OXYCODONE HCL 5 MG PO TABS
5.0000 mg | ORAL_TABLET | Freq: Once | ORAL | Status: AC | PRN
  Administered 2021-06-10: 5 mg via ORAL

## 2021-06-10 MED ORDER — ALPRAZOLAM 1 MG PO TABS
1.0000 mg | ORAL_TABLET | Freq: Four times a day (QID) | ORAL | Status: DC | PRN
  Administered 2021-06-10 – 2021-06-12 (×6): 1 mg via ORAL
  Filled 2021-06-10 (×6): qty 1

## 2021-06-10 MED ORDER — KETAMINE HCL 10 MG/ML IJ SOLN
INTRAMUSCULAR | Status: DC | PRN
  Administered 2021-06-10: 20 mg via INTRAVENOUS

## 2021-06-10 MED ORDER — ONDANSETRON HCL 4 MG/2ML IJ SOLN
4.0000 mg | Freq: Four times a day (QID) | INTRAMUSCULAR | Status: DC | PRN
  Administered 2021-06-12: 4 mg via INTRAVENOUS
  Filled 2021-06-10: qty 2

## 2021-06-10 MED ORDER — ACETAMINOPHEN 500 MG PO TABS
ORAL_TABLET | ORAL | Status: AC
  Filled 2021-06-10: qty 2

## 2021-06-10 MED ORDER — FENTANYL CITRATE (PF) 100 MCG/2ML IJ SOLN
25.0000 ug | INTRAMUSCULAR | Status: DC | PRN
  Administered 2021-06-10 (×2): 50 ug via INTRAVENOUS

## 2021-06-10 MED ORDER — METOPROLOL TARTRATE 25 MG PO TABS
25.0000 mg | ORAL_TABLET | Freq: Two times a day (BID) | ORAL | Status: DC
  Administered 2021-06-10 – 2021-06-12 (×4): 25 mg via ORAL
  Filled 2021-06-10 (×4): qty 1

## 2021-06-10 MED ORDER — GABAPENTIN 300 MG PO CAPS
900.0000 mg | ORAL_CAPSULE | Freq: Three times a day (TID) | ORAL | Status: DC
  Administered 2021-06-10 – 2021-06-12 (×6): 900 mg via ORAL
  Filled 2021-06-10 (×6): qty 3

## 2021-06-10 MED ORDER — SODIUM CHLORIDE 0.9 % IV SOLN
INTRAVENOUS | Status: AC
  Filled 2021-06-10: qty 100

## 2021-06-10 MED ORDER — ENALAPRILAT 1.25 MG/ML IV SOLN
0.6250 mg | Freq: Four times a day (QID) | INTRAVENOUS | Status: DC | PRN
  Filled 2021-06-10: qty 1

## 2021-06-10 MED ORDER — IBUPROFEN 400 MG PO TABS
600.0000 mg | ORAL_TABLET | Freq: Four times a day (QID) | ORAL | Status: DC
Start: 2021-06-10 — End: 2021-06-12
  Administered 2021-06-10 – 2021-06-12 (×9): 600 mg via ORAL
  Filled 2021-06-10 (×9): qty 1

## 2021-06-10 MED ORDER — SIMETHICONE 80 MG PO CHEW
CHEWABLE_TABLET | ORAL | Status: AC
  Filled 2021-06-10: qty 1

## 2021-06-10 MED ORDER — ONDANSETRON HCL 4 MG/2ML IJ SOLN: 4.0000 mg | Freq: Once | INTRAMUSCULAR | Status: DC | PRN

## 2021-06-10 MED ORDER — SODIUM CHLORIDE 0.9 % IV SOLN
2.0000 g | INTRAVENOUS | Status: AC
  Administered 2021-06-10: 2 g via INTRAVENOUS

## 2021-06-10 MED ORDER — METHOCARBAMOL 500 MG PO TABS: 500.0000 mg | ORAL_TABLET | Freq: Four times a day (QID) | ORAL | Status: DC | PRN

## 2021-06-10 MED ORDER — SUGAMMADEX SODIUM 200 MG/2ML IV SOLN
INTRAVENOUS | Status: DC | PRN
  Administered 2021-06-10: 220 mg via INTRAVENOUS

## 2021-06-10 MED ORDER — FAMOTIDINE 20 MG PO TABS
40.0000 mg | ORAL_TABLET | Freq: Two times a day (BID) | ORAL | Status: DC
  Administered 2021-06-10 – 2021-06-12 (×4): 40 mg via ORAL
  Filled 2021-06-10 (×4): qty 2

## 2021-06-10 MED ORDER — 0.9 % SODIUM CHLORIDE (POUR BTL) OPTIME
TOPICAL | Status: DC | PRN
Start: 2021-06-10 — End: 2021-06-10
  Administered 2021-06-10: 500 mL
  Administered 2021-06-10: 1000 mL

## 2021-06-10 MED ORDER — FENTANYL CITRATE (PF) 100 MCG/2ML IJ SOLN
INTRAMUSCULAR | Status: DC | PRN
  Administered 2021-06-10 (×2): 50 ug via INTRAVENOUS
  Administered 2021-06-10: 100 ug via INTRAVENOUS
  Administered 2021-06-10: 50 ug via INTRAVENOUS

## 2021-06-10 MED ORDER — FLUTICASONE PROPIONATE 50 MCG/ACT NA SUSP
2.0000 | Freq: Every day | NASAL | Status: DC
  Administered 2021-06-10 – 2021-06-12 (×3): 2 via NASAL
  Filled 2021-06-10: qty 16

## 2021-06-10 MED ORDER — OXYCODONE HCL 5 MG PO TABS
ORAL_TABLET | ORAL | Status: AC
  Filled 2021-06-10: qty 1

## 2021-06-10 MED ORDER — PANTOPRAZOLE SODIUM 40 MG PO TBEC
40.0000 mg | DELAYED_RELEASE_TABLET | Freq: Two times a day (BID) | ORAL | Status: DC
  Administered 2021-06-10 – 2021-06-12 (×4): 40 mg via ORAL
  Filled 2021-06-10 (×4): qty 1

## 2021-06-10 MED ORDER — CHLORHEXIDINE GLUCONATE CLOTH 2 % EX PADS
6.0000 | MEDICATED_PAD | Freq: Once | CUTANEOUS | Status: DC
Start: 2021-06-10 — End: 2021-06-10

## 2021-06-10 MED ORDER — CELECOXIB 200 MG PO CAPS
ORAL_CAPSULE | ORAL | Status: AC
  Filled 2021-06-10: qty 1

## 2021-06-10 MED ORDER — GABAPENTIN 300 MG PO CAPS
300.0000 mg | ORAL_CAPSULE | ORAL | Status: DC
Start: 2021-06-10 — End: 2021-06-10

## 2021-06-10 MED ORDER — MAGIC MOUTHWASH
15.0000 mL | Freq: Four times a day (QID) | ORAL | Status: DC | PRN
Start: 2021-06-10 — End: 2021-06-12

## 2021-06-10 MED ORDER — HYDROMORPHONE HCL 1 MG/ML IJ SOLN
0.5000 mg | INTRAMUSCULAR | Status: DC | PRN
  Administered 2021-06-10: 0.5 mg via INTRAVENOUS
  Administered 2021-06-10 – 2021-06-12 (×8): 1 mg via INTRAVENOUS
  Filled 2021-06-10 (×10): qty 1

## 2021-06-10 MED ORDER — PROPOFOL 10 MG/ML IV BOLUS
INTRAVENOUS | Status: DC | PRN
  Administered 2021-06-10: 200 mg via INTRAVENOUS
  Administered 2021-06-10: 100 mg via INTRAVENOUS

## 2021-06-10 MED ORDER — IOHEXOL 300 MG/ML  SOLN
INTRAMUSCULAR | Status: DC | PRN
  Administered 2021-06-10: 6 mL

## 2021-06-10 MED ORDER — CHLORHEXIDINE GLUCONATE CLOTH 2 % EX PADS: 6.0000 | MEDICATED_PAD | Freq: Once | CUTANEOUS | Status: DC

## 2021-06-10 MED ORDER — ONDANSETRON 4 MG PO TBDP: 4.0000 mg | ORAL_TABLET | Freq: Four times a day (QID) | ORAL | Status: DC | PRN

## 2021-06-10 MED ORDER — MIDAZOLAM HCL 2 MG/2ML IJ SOLN
INTRAMUSCULAR | Status: AC
Start: 2021-06-10 — End: ?
  Filled 2021-06-10: qty 2

## 2021-06-10 MED ORDER — SODIUM CHLORIDE 0.9% FLUSH
3.0000 mL | INTRAVENOUS | Status: DC | PRN
Start: 2021-06-10 — End: 2021-06-12

## 2021-06-10 SURGICAL SUPPLY — 59 items
APL PRP STRL LF DISP 70% ISPRP (MISCELLANEOUS) ×2
APPLIER CLIP 5 13 M/L LIGAMAX5 (MISCELLANEOUS) ×2
APR CLP MED LRG 5 ANG JAW (MISCELLANEOUS) ×1
BAG SPEC RTRVL 10 TROC 200 (ENDOMECHANICALS) ×1
CABLE HIGH FREQUENCY MONO STRZ (ELECTRODE) ×3 IMPLANT
CHLORAPREP W/TINT 26 (MISCELLANEOUS) ×4 IMPLANT
CLIP APPLIE 5 13 M/L LIGAMAX5 (MISCELLANEOUS) ×2 IMPLANT
CNTNR URN SCR LID CUP LEK RST (MISCELLANEOUS) IMPLANT
CONT SPEC 4OZ STRL OR WHT (MISCELLANEOUS) ×2
COVER MAYO STAND STRL (DRAPES) ×1 IMPLANT
DECANTER SPIKE VIAL GLASS SM (MISCELLANEOUS) ×2 IMPLANT
DRAIN CHANNEL 19F RND (DRAIN) IMPLANT
DRAPE C-ARM 42X120 X-RAY (DRAPES) ×3 IMPLANT
DRAPE WARM FLUID 44X44 (DRAPES) ×3 IMPLANT
DRSG TEGADERM 2-3/8X2-3/4 SM (GAUZE/BANDAGES/DRESSINGS) ×1 IMPLANT
DRSG TEGADERM 4X4.75 (GAUZE/BANDAGES/DRESSINGS) ×3 IMPLANT
DRSG TELFA 3X8 NADH (GAUZE/BANDAGES/DRESSINGS) ×2 IMPLANT
ELECT REM PT RETURN 9FT ADLT (ELECTROSURGICAL) ×2
ELECTRODE REM PT RTRN 9FT ADLT (ELECTROSURGICAL) ×2 IMPLANT
ENDOLOOP SUT PDS II  0 18 (SUTURE)
ENDOLOOP SUT PDS II 0 18 (SUTURE) IMPLANT
EVACUATOR SILICONE 100CC (DRAIN) IMPLANT
GAUZE 4X4 16PLY ~~LOC~~+RFID DBL (SPONGE) ×3 IMPLANT
GAUZE SPONGE 2X2 12PLY NS (GAUZE/BANDAGES/DRESSINGS) ×1 IMPLANT
GLOVE ECLIPSE 8.0 STRL XLNG CF (GLOVE) ×3 IMPLANT
GLOVE SRG 8 PF TXTR STRL LF DI (GLOVE) ×2 IMPLANT
GLOVE SURG UNDER POLY LF SZ8 (GLOVE) ×2
GOWN STRL REUS W/TWL XL LVL3 (GOWN DISPOSABLE) ×3 IMPLANT
IRRIG SUCT STRYKERFLOW 2 WTIP (MISCELLANEOUS)
IRRIGATION SUCT STRKRFLW 2 WTP (MISCELLANEOUS) ×2 IMPLANT
KIT TURNOVER CYSTO (KITS) ×3 IMPLANT
NDL BIOPSY 14X6 SOFT TISS (NEEDLE) IMPLANT
NEEDLE BIOPSY 14X6 SOFT TISS (NEEDLE) ×2 IMPLANT
NEEDLE INSUFFLATION 120MM (ENDOMECHANICALS) IMPLANT
NS IRRIG 1000ML POUR BTL (IV SOLUTION) ×1 IMPLANT
NS IRRIG 500ML POUR BTL (IV SOLUTION) ×1 IMPLANT
PACK BASIN DAY SURGERY FS (CUSTOM PROCEDURE TRAY) ×3 IMPLANT
PAD DRESSING TELFA 3X8 NADH (GAUZE/BANDAGES/DRESSINGS) IMPLANT
PAD POSITIONING PINK XL (MISCELLANEOUS) ×3 IMPLANT
POUCH RETRIEVAL ECOSAC 10 (ENDOMECHANICALS) ×2 IMPLANT
POUCH RETRIEVAL ECOSAC 10MM (ENDOMECHANICALS) ×2
SCISSORS LAP 5X35 DISP (ENDOMECHANICALS) ×3 IMPLANT
SET CHOLANGIOGRAPH MIX (MISCELLANEOUS) ×3 IMPLANT
SET TUBE SMOKE EVAC HIGH FLOW (TUBING) ×3 IMPLANT
SHEARS HARMONIC ACE PLUS 36CM (ENDOMECHANICALS) ×1 IMPLANT
SPONGE GAUZE 2X2 8PLY STRL LF (GAUZE/BANDAGES/DRESSINGS) ×3 IMPLANT
SPONGE T-LAP 18X18 ~~LOC~~+RFID (SPONGE) IMPLANT
STOPCOCK 4 WAY LG BORE MALE ST (IV SETS) ×1 IMPLANT
SUT MNCRL AB 4-0 PS2 18 (SUTURE) ×3 IMPLANT
SUT PDS AB 1 CT1 27 (SUTURE) ×7 IMPLANT
SUT VIC AB 2-0 UR6 27 (SUTURE) IMPLANT
SYR 20ML LL LF (SYRINGE) IMPLANT
TOWEL OR 17X26 10 PK STRL BLUE (TOWEL DISPOSABLE) ×3 IMPLANT
TRAY FOLEY W/BAG SLVR 14FR LF (SET/KITS/TRAYS/PACK) ×1 IMPLANT
TRAY LAPAROSCOPIC (CUSTOM PROCEDURE TRAY) ×3 IMPLANT
TROCAR 5M 150ML BLDLS (TROCAR) ×3 IMPLANT
TROCAR BLADELESS OPT 5 100 (ENDOMECHANICALS) IMPLANT
TROCAR XCEL NON-BLD 11X100MML (ENDOMECHANICALS) IMPLANT
TROCAR Z-THREAD FIOS 5X100MM (TROCAR) ×3 IMPLANT

## 2021-06-10 NOTE — Transfer of Care (Signed)
Immediate Anesthesia Transfer of Care Note ? ?Patient: Belinda [name] ? ?Procedure(s) Performed: LAPAROSCOPIC CHOLECYSTECTOMY WITH INTRAOPERATIVE CHOLANGIOGRAM (Abdomen) ?NEEDLE CORE BIOPSY OF LIVER (Abdomen) ? ?Patient Location: PACU ? ?Anesthesia Type:General ? ?Level of Consciousness: awake and patient cooperative ? ?Airway & Oxygen Therapy: Patient Spontanous Breathing and Patient connected to nasal cannula oxygen ? ?Post-op Assessment: Report given to RN and Post -op Vital signs reviewed and stable ? ?Post vital signs: Reviewed and stable ? ?Last Vitals:  ?Vitals Value Taken Time  ?BP 146/87 06/10/21 1115  ?Temp    ?Pulse 84 06/10/21 1123  ?Resp 28 06/10/21 1123  ?SpO2 95 % 06/10/21 1123  ?Vitals shown include unvalidated device data. ? ?Last Pain:  ?Vitals:  ? 06/10/21 0742  ?TempSrc: Oral  ?PainSc: 7   ?   ? ?Patients Stated Pain Goal: 5 (06/10/21 1444) ? ?Complications: No notable events documented. ?

## 2021-06-10 NOTE — Anesthesia Procedure Notes (Signed)
Procedure Name: Intubation ?Date/Time: 06/10/2021 9:21 AM ?Performed by: Georgeanne Nim, CRNA ?Pre-anesthesia Checklist: Patient identified, Emergency Drugs available, Suction available, Patient being monitored and Timeout performed ?Patient Re-evaluated:Patient Re-evaluated prior to induction ?Oxygen Delivery Method: Circle system utilized ?Preoxygenation: Pre-oxygenation with 100% oxygen ?Induction Type: IV induction ?Ventilation: Oral airway inserted - appropriate to patient size ?Grade View: Grade I ?Tube size: 7.0 mm ?Number of attempts: 1 ?Airway Equipment and Method: Stylet ?Placement Confirmation: ETT inserted through vocal cords under direct vision, positive ETCO2, CO2 detector and breath sounds checked- equal and bilateral ?Secured at: 21 cm ?Tube secured with: Tape ?Dental Injury: Teeth and Oropharynx as per pre-operative assessment  ? ? ? ? ?

## 2021-06-10 NOTE — Addendum Note (Signed)
Addendum  created 06/10/21 1458 by Georgeanne Nim, CRNA  ? Flowsheet accepted, Intraprocedure Event edited, Intraprocedure Flowsheets edited  ?  ?

## 2021-06-10 NOTE — Anesthesia Postprocedure Evaluation (Signed)
Anesthesia Post Note ? ?Patient: Belinda [name] ? ?Procedure(s) Performed: LAPAROSCOPIC CHOLECYSTECTOMY WITH INTRAOPERATIVE CHOLANGIOGRAM (Abdomen) ?NEEDLE CORE BIOPSY OF LIVER (Abdomen) ? ?  ? ?Patient location during evaluation: PACU ?Anesthesia Type: General ?Level of consciousness: awake and alert ?Pain management: pain level controlled ?Vital Signs Assessment: post-procedure vital signs reviewed and stable ?Respiratory status: spontaneous breathing, nonlabored ventilation and respiratory function stable ?Cardiovascular status: blood pressure returned to baseline and stable ?Postop Assessment: no apparent nausea or vomiting ?Anesthetic complications: no ? ? ?No notable events documented. ? ?Last Vitals:  ?Vitals:  ? 06/10/21 1301 06/10/21 1358  ?BP: (!) 153/107 126/82  ?Pulse: 79 78  ?Resp:  15  ?Temp: 36.6 ?C 36.7 ?C  ?SpO2: 100% 93%  ?  ?Last Pain:  ?Vitals:  ? 06/10/21 1358  ?TempSrc: Oral  ?PainSc:   ? ? ?  ?  ?  ?  ?  ?  ? ?Lidia Collum ? ? ? ? ?

## 2021-06-10 NOTE — Discharge Instructions (Signed)
################################################################ ? ?LAPAROSCOPIC SURGERY: POST OP INSTRUCTIONS ? ?###################################################################### ? ?EAT ?Gradually transition to a high fiber diet with a fiber supplement over the next few weeks after discharge.  Start with a pureed / full liquid diet (see below) ? ?WALK ?Walk an hour a day.  Control your pain to do that.   ? ?CONTROL PAIN ?Control pain so that you can walk, sleep, tolerate sneezing/coughing, go up/down stairs. ? ?HAVE A BOWEL MOVEMENT DAILY ?Keep your bowels regular to avoid problems.  OK to try a laxative to override constipation.  OK to use an antidairrheal to slow down diarrhea.  Call if not better after 2 tries ? ?CALL IF YOU HAVE PROBLEMS/CONCERNS ?Call if you are still struggling despite following these instructions. ?Call if you have concerns not answered by these instructions ? ?###################################################################### ? ? ? ?DIET: Follow a light bland diet & liquids the first 24 hours after arrival home, such as soup, liquids, starches, etc.  Be sure to drink plenty of fluids.  Quickly advance to a usual solid diet within a few days.  Avoid fast food or heavy meals as your are more likely to get nauseated or have irregular bowels.  A low-fat, high-fiber diet for the rest of your life is ideal. ? ?Take your usually prescribed home medications unless otherwise directed. ? ?PAIN CONTROL: ?Pain is best controlled by a usual combination of three different methods TOGETHER: ?Ice/Heat ?Over the counter pain medication ?Prescription pain medication ?Most patients will experience some swelling and bruising around the incisions.  Ice packs or heating pads (30-60 minutes up to 6 times a day) will help. Use ice for the first few days to help decrease swelling and bruising, then switch to heat to help relax tight/sore spots and speed recovery.  Some people prefer to use ice alone, heat  alone, alternating between ice & heat.  Experiment to what works for you.  Swelling and bruising can take several weeks to resolve.   ?It is helpful to take an over-the-counter pain medication regularly for the first few weeks.  Choose one of the following that works best for you: ?Naproxen (Aleve, etc)  Two 239m tabs twice a day ?Ibuprofen (Advil, etc) Three 2041mtabs four times a day (every meal & bedtime) ?Acetaminophen (Tylenol, etc) 500-65014mour times a day (every meal & bedtime) ?A  prescription for pain medication (such as oxycodone, hydrocodone, tramadol, gabapentin, methocarbamol, etc) should be given to you upon discharge.  Take your pain medication as prescribed.  ?If you are having problems/concerns with the prescription medicine (does not control pain, nausea, vomiting, rash, itching, etc), please call us [phone] see if we need to switch you to a different pain medicine that will work better for you and/or control your side effect better. ?If you need a refill on your pain medication, please give us [place] hour notice.  contact your pharmacy.  They will contact our office to request authorization. Prescriptions will not be filled after 5 pm or on week-ends ? ?Avoid getting constipated.   ?Between the surgery and the pain medications, it is common to experience some constipation.   ?Increasing fluid intake and taking a fiber supplement (such as Metamucil, Citrucel, FiberCon, MiraLax, etc) 1-2 times a day regularly will usually help prevent this problem from occurring.   ?A mild laxative (prune juice, Milk of Magnesia, MiraLax, etc) should be taken according to package directions if there are no bowel movements after 48 hours.   ?Watch out for diarrhea.   ?  If you have many loose bowel movements, simplify your diet to bland foods & liquids for a few days.   ?Stop any stool softeners and decrease your fiber supplement.   ?Switching to mild anti-diarrheal medications (Kayopectate, Pepto Bismol) can  help.   ?If this worsens or does not improve, please call us. ? ?Wash / shower every day.  You may shower over the dressings as they are waterproof.  Continue to shower over incision(s) after the dressing is off. ? ?REMOVE ALL DRESSINGS: Remove your waterproof bandages (tegaderm clear band-aids, steristrip skin tapes, etc) THREE DAYS AFTER SURGERY.  You may leave the incisions open to air.  You may replace a dressing/Band-Aid to cover the incision for comfort if you wish.  ? ?ACTIVITIES as tolerated:   ?You may resume regular (light) daily activities beginning the next day--such as daily self-care, walking, climbing stairs--gradually increasing activities as tolerated.  If you can walk 30 minutes without difficulty, it is safe to try more intense activity such as jogging, treadmill, bicycling, low-impact aerobics, swimming, etc. ?Save the most intensive and strenuous activity for last such as sit-ups, heavy lifting, contact sports, etc  Refrain from any heavy lifting or straining until you are off narcotics for pain control.   ?DO NOT PUSH THROUGH PAIN.  Let pain be your guide: If it hurts to do something, don't do it.  Pain is your body warning you to avoid that activity for another week until the pain goes down. ?You may drive when you are no longer taking prescription pain medication, you can comfortably wear a seatbelt, and you can safely maneuver your car and apply brakes. ?You may have sexual intercourse when it is comfortable. ? ?FOLLOW UP in our office ?Please call [facility] at [phone] to set up an appointment to see your surgeon in the office for a follow-up appointment approximately 2-3 weeks after your surgery. ?Make sure that you call for this appointment the day you arrive home to insure a convenient appointment time. ? ?10. IF YOU HAVE DISABILITY OR FAMILY LEAVE FORMS, BRING THEM TO THE OFFICE FOR PROCESSING.  DO NOT GIVE THEM TO YOUR DOCTOR. ? ? ?WHEN TO CALL us [phone]: ?Poor pain  control ?Reactions / problems with new medications (rash/itching, nausea, etc)  ?Fever over 101.5 F (38.5 C) ?Inability to urinate ?Nausea and/or vomiting ?Worsening swelling or bruising ?Continued bleeding from incision. ?Increased pain, redness, or drainage from the incision ? ? The clinic staff is available to answer your questions during regular business hours (8:30am-5pm).  Please don?t hesitate to call and ask to speak to one of our nurses for clinical concerns.  ? If you have a medical emergency, go to the nearest emergency room or call 911. ? A surgeon from [facility] is always on call at the hospitals ? ? ?[facility], [place] ?[address], [place], [place], [place]  [address] ? ?MAIN: [phone] ? TOLL FREE: [phone] ?  ?FAX [phone] ?[email or web address] ? ?############################################################## ? ? ? ?

## 2021-06-10 NOTE — H&P (Addendum)
?06/10/2021 ? ? ? ? ?REFERRING PHYSICIAN: Norva Karvonen, MD ? ?Patient Care Team: ?Norva Karvonen, MD as PCP - General (Family Medicine) ?Brand Males, MD (Pulmonary Disease) ?Laderius Valbuena, Adrian Saran, MD as Consulting Provider (General Surgery) ?Rourk, [name], MD (Gastroenterology) ? ?PROVIDER: Hollace Kinnier, MD ? ?[facility] MRN: [id number] ?DOB: 09-03-1980 ?DATE OF ENCOUNTER:06/10/2021 ? ? ?SUBJECTIVE  ? ?Chief Complaint: Cholelithiasis ? ? ?History of Present Illness: ?Belinda [name] is a 41 y.o. female who is seen today  ?as an office consultation at the request of Dr. Wolfgang Phoenix  ?for evaluation of Cholelithiasis ?.  ? ?Pleasant but anxious woman with multiple medical issues. Chronic heartburn. Chronic constipation. Relief she has a history of being on some chronic opioid maintenance for chronic pain. Anxiety. Morbid obesity. Tobacco abuse. She had an episode of severe COVID requiring hospitalization and eventually tracheostomy. Very prolonged hospital stay. She is using a wheelchair to get around. She has had episodes of upper abdominal pain and nausea and vomiting. Had work-up confirming gallstones. Followed by gastroenterology. Had an endoscopy in 2020 that was rather underwhelming. History of being on Pepcid but more recently has been on proton pump inhibitors such as Dexilant and Protonix. She has had recurrent episodes. She had an episode of pain and nausea and vomiting. Went to the emergency department. Could not stay after being there for about 7 hours. Troponins and EKG argued against any myocardial events. She is on inhalers and an history of trach for COVID. She still smokes less than a pack a day. Follow-up labs revealed a lipase of over thousand suspicious for gallstone pancreatitis. She claims she does not drink alcohol. Severe constipation intermittently on Linzess and Colace and other medications. Moves her bowels maybe once or twice a week at best. Bloating. Based on concerns she has  been seeing multiple specialist. Because of her persistent symptoms, surgical consultation requested. ? ?Patient comes today in wheelchair with her husband. Rather anxious and wishes to discuss her past history in detail. Frustrated. Has transition to a bland diet. Attacks not as bothersome as it used to be but hard to tolerate taking that. She had eggs this weekend and got another attack. It takes effort with her husband's help to get out of the wheelchair up to the examination table. ?Claims postprandial nausea. Occasional vomiting. Occasional dysphagia. No hematemesis. No hematochezia nor melena. Notes abdominal pain most quadrants but worse in the epigastric and right upper quadrant regions. There is concern on evaluation of a kidney mass. She had an MRI which dissuaded that. She still had some pancreatitis a week after her episode in the emergency department. No active cholecystitis. She required intubation for that due to anxiety and other issues and tolerated it relatively well. ? 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  Has tolerated hydrocodone and tramadol in the past.  He has that Subutex in combination for short-term pain control regimen and she feels that helps keep her from getting addicted again ? ?Medical History: ? ?Past Medical History:  ?Diagnosis Date  ? Anemia  ? Anxiety  ? Arrhythmia  ? Asthma, unspecified asthma severity, unspecified whether complicated, unspecified whether persistent  ? GERD (gastroesophageal reflux disease)  ? Liver disease  ? Sleep apnea  ? ?There is no problem list on file for this patient. ? ?Past Surgical History:  ?Procedure Laterality Date  ? CESAREAN SECTION N/A  ? ? ?Allergies  ?Allergen Reactions  ? Aspirin Headache  ?headache  ? Bupropion Other (See Comments)  ?Insomnia/anxiety  ?  Buspirone Other (See Comments)  ?Mania/insomnia  ? Citalopram Hydrobromide Other (See Comments)  ?Insomnia/mania  ? Doxycycline Rash  ? ?Current Outpatient Medications on File Prior  to Visit  ?Medication Sig Dispense Refill  ? albuterol 90 mcg/actuation inhaler Inhale 2 inhalations into the lungs every 6 (six) hours as needed  ? cefdinir (OMNICEF) 300 mg capsule Take 300 mg by mouth 2 (two) times daily  ? clobetasoL (CORMAX) 0.05 % external solution Apply topically 2 (two) times daily as needed  ? dexlansoprazole (DEXILANT) 60 mg DR capsule Take 1 capsule by mouth once daily  ? famotidine (PEPCID) 40 MG tablet Take 1 tablet by mouth once daily  ? fluticasone propionate (FLONASE) 50 mcg/actuation nasal spray USE (2) SPRAYS IN EACH NOSTRIL ONCE DAILY.  ? FUROsemide (LASIX) 20 MG tablet TAKE 1/2 TO 1 TABLET IN THE MORNING AS NEEDED FOR SWELLING IN LEGS.  ? gabapentin (NEURONTIN) 300 MG capsule TAKE 3 CAPSULES THREE TIMES DAILY.  ? ketoconazole (NIZORAL) 2 % cream Apply topically  ? linaCLOtide (LINZESS) 290 mcg capsule TAKE 1 CAPSULE BEFORE BREAKFAST.  ? metoprolol tartrate (LOPRESSOR) 25 MG tablet TAKE (1/2) TABLET BY MOUTH 2 TIMES A DAY.  ? ondansetron (ZOFRAN) 8 MG tablet Take 8 mg by mouth every 8 (eight) hours as needed  ? oseltamivir (TAMIFLU) 75 MG capsule Take 75 mg by mouth 2 (two) times daily  ? potassium chloride (KLOR-CON) 10 mEq ER tablet Take 10 mEq by mouth 2 (two) times daily  ? acetaminophen (TYLENOL) 325 MG tablet Take by mouth  ? buprenorphine HCL (SUBUTEX) 8 mg SL tablet Place under the tongue  ? colloidal oatmeaL 2 % Crea Apply topically  ? FLOVENT HFA 110 mcg/actuation inhaler  ? ibuprofen (MOTRIN) 200 MG tablet Take 600 mg by mouth 2 (two) times daily  ? naphazoline-pheniramine (NAPHCON-A) 0.025-0.3 % ophthalmic solution Apply to eye  ? pantoprazole (PROTONIX) 40 MG DR tablet Take by mouth  ? ?No current facility-administered medications on file prior to visit.  ? ?Family History  ?Problem Relation Age of Onset  ? Stroke Father  ? Obesity Father  ? High blood pressure (Hypertension) Father  ? Heart valve disease Father  ? Hyperlipidemia (Elevated cholesterol) Father  ? Deep  vein thrombosis (DVT or abnormal blood clot formation) Father  ? Obesity Sister  ? High blood pressure (Hypertension) Sister  ? Hyperlipidemia (Elevated cholesterol) Sister  ? ? ?Social History  ? ?Tobacco Use  ?Smoking Status Some Days  ? Types: Cigarettes  ?Smokeless Tobacco Never  ? ? ?Social History  ? ?Socioeconomic History  ? Marital status: Legally Separated  ?Tobacco Use  ? Smoking status: Some Days  ?Types: Cigarettes  ? Smokeless tobacco: Never  ?Vaping Use  ? Vaping Use: Never used  ?Substance and Sexual Activity  ? Alcohol use: Never  ? Drug use: Never  ? ?############################################################ ? ?Review of Systems: Very positive review of systems with over 20 items. ?A complete review of sys`tems (ROS) was obtained from the patient. I have reviewed this information and discussed as appropriate with the patient. See HPI as well for other pertinent ROS. ? ?Constitutional: No fevers, chills, sweats. Weight stable. Does feel like she has chronic weakness. ?Eyes: No vision changes, No discharge ?HENT: Chronic sore throat. Increased mucus discharge. Going to see an otolaryngologist soon.  ?Lymph: No neck swelling, No bruising easily ?Pulmonary: Chronic cough, productive sputum. Uses inhalers. ?CV: No orthopnea, PND Patient needs help just to get up to the examination table. She  does not walk. y. No exertional chest/neck/shoulder/arm pain. ? ?GI: Per HPI. No personal nor family history of GI/colon cancer, inflammatory bowel disease, irritable bowel syndrome, allergy such as Celiac Sprue. She believes she is lactose intolerant. No definite documentation of colitis, ulcers nor gastritis. No recent sick contacts/gastroenteritis. No travel outside the country. No changes in diet. Positive heartburn. ? ?Renal: No UTIs, No hematuria ?Genital: No drainage, bleeding, masses ?Musculoskeletal: No severe joint pain. Fair ROM major joints. Claims weakness and right foot/ankle ?Skin: Occasional  itching and sores. Believes she has psoriasis. ?Heme/Lymph: No easy bleeding. No swollen lymph nodes ? ?OBJECTIVE  ? ?Vitals:  ?03/09/21 1525  ?BP: 108/82  ?Pulse: 83  ?Temp: 36.1 ?C (97 ?F)  ?SpO2: 95%  ?We

## 2021-06-10 NOTE — Plan of Care (Signed)
?  Problem: Education: ?Goal: Required Educational Video(s) ?06/10/2021 1809 by Warren Lacy, RN ?Outcome: Progressing ?06/10/2021 1809 by Warren Lacy, RN ?Outcome: Progressing ?  ?Problem: Clinical Measurements: ?Goal: Ability to maintain clinical measurements within normal limits will improve ?06/10/2021 1809 by Warren Lacy, RN ?Outcome: Progressing ?06/10/2021 1809 by Warren Lacy, RN ?Outcome: Progressing ?Goal: Postoperative complications will be avoided or minimized ?06/10/2021 1809 by Warren Lacy, RN ?Outcome: Progressing ?06/10/2021 1809 by Warren Lacy, RN ?Outcome: Progressing ?  ?Problem: Skin Integrity: ?Goal: Demonstration of wound healing without infection will improve ?06/10/2021 1809 by Warren Lacy, RN ?Outcome: Progressing ?06/10/2021 1809 by Warren Lacy, RN ?Outcome: Progressing ?  ?Problem: Education: ?Goal: Knowledge of General Education information will improve ?Description: Including pain rating scale, medication(s)/side effects and non-pharmacologic comfort measures ?06/10/2021 1809 by Warren Lacy, RN ?Outcome: Progressing ?06/10/2021 1809 by Warren Lacy, RN ?Outcome: Progressing ?  ?Problem: Health Behavior/Discharge Planning: ?Goal: Ability to manage health-related needs will improve ?06/10/2021 1809 by Warren Lacy, RN ?Outcome: Progressing ?06/10/2021 1809 by Warren Lacy, RN ?Outcome: Progressing ?  ?Problem: Clinical Measurements: ?Goal: Ability to maintain clinical measurements within normal limits will improve ?06/10/2021 1809 by Warren Lacy, RN ?Outcome: Progressing ?06/10/2021 1809 by Warren Lacy, RN ?Outcome: Progressing ?Goal: Will remain free from infection ?06/10/2021 1809 by Warren Lacy, RN ?Outcome: Progressing ?06/10/2021 1809 by Warren Lacy, RN ?Outcome: Progressing ?Goal: Diagnostic test results will improve ?06/10/2021 1809 by Warren Lacy, RN ?Outcome: Progressing ?06/10/2021 1809 by Warren Lacy, RN ?Outcome: Progressing ?Goal: Respiratory complications will improve ?06/10/2021 1809 by Warren Lacy, RN ?Outcome: Progressing ?06/10/2021 1809 by Warren Lacy, RN ?Outcome: Progressing ?Goal: Cardiovascular complication will be avoided ?06/10/2021 1809 by Warren Lacy, RN ?Outcome: Progressing ?06/10/2021 1809 by Warren Lacy, RN ?Outcome: Progressing ?  ?Problem: Activity: ?Goal: Risk for activity intolerance will decrease ?06/10/2021 1809 by Warren Lacy, RN ?Outcome: Progressing ?06/10/2021 1809 by Warren Lacy, RN ?Outcome: Progressing ?  ?Problem: Nutrition: ?Goal: Adequate nutrition will be maintained ?06/10/2021 1809 by Warren Lacy, RN ?Outcome: Progressing ?06/10/2021 1809 by Warren Lacy, RN ?Outcome: Progressing ?  ?Problem: Coping: ?Goal: Level of anxiety will decrease ?06/10/2021 1809 by Warren Lacy, RN ?Outcome: Progressing ?06/10/2021 1809 by Warren Lacy, RN ?Outcome: Progressing ?  ?Problem: Elimination: ?Goal: Will not experience complications related to bowel motility ?06/10/2021 1809 by Warren Lacy, RN ?Outcome: Progressing ?06/10/2021 1809 by Warren Lacy, RN ?Outcome: Progressing ?Goal: Will not experience complications related to urinary retention ?06/10/2021 1809 by Warren Lacy, RN ?Outcome: Progressing ?06/10/2021 1809 by Warren Lacy, RN ?Outcome: Progressing ?  ?Problem: Pain Managment: ?Goal: General experience of comfort will improve ?06/10/2021 1809 by Warren Lacy, RN ?Outcome: Progressing ?06/10/2021 1809 by Warren Lacy, RN ?Outcome: Progressing ?  ?Problem: Safety: ?Goal: Ability to remain free from injury will improve ?06/10/2021 1809 by Warren Lacy, RN ?Outcome: Progressing ?06/10/2021 1809 by Warren Lacy, RN ?Outcome: Progressing ?  ?Problem: Skin Integrity: ?Goal: Risk for impaired skin integrity will decrease ?06/10/2021 1809 by Warren Lacy, RN ?Outcome: Progressing ?06/10/2021 1809  by Warren Lacy, RN ?Outcome: Progressing ?  ?

## 2021-06-10 NOTE — Op Note (Signed)
06/10/2021 ? ?PATIENT:  Belinda [name]  41 y.o. female ? ?Patient Care Team: ?Belinda Drown, MD as PCP - General (Family Medicine) ?Rourk, [name], MD as Consulting Physician (Gastroenterology) ?Belinda Drown, MD (Family Medicine) ?Belinda Males, MD as Consulting Physician (Pulmonary Disease) ?Belinda Boston, MD as Consulting Physician (General Surgery) ? ?PRE-OPERATIVE DIAGNOSIS:   ? ?Chronic Calculus cholecystitis ? ?POST-OPERATIVE DIAGNOSIS:  ? ?Chronic Calculus cholecystitis ?Fatty steatohepatitis ? ?PROCEDURE:  Core Liver Biopsy (CPT code 47001) & SINGLE SITE Laparoscopic cholecystectomy with intraoperative cholangiogram (CPT code [phone]) ? ?SURGEON:  Belinda Hector, MD, FACS. ? ?ASSISTANT: OR Staff  ? ?ANESTHESIA:    ?General with endotracheal intubation ?Local anesthetic as a field block ? ?EBL:  (See Anesthesia Intraoperative Record) ?Total I/O ?In: -  ?Out: 64 [Urine:50; Blood:30] ? ?Delay start of Pharmacological VTE agent (>24hrs) due to surgical blood loss or risk of bleeding:  no ? ?DRAINS: None  ? ?SPECIMEN: Gallbladder & Core liver biopsies   ? ?DISPOSITION OF SPECIMEN:  PATHOLOGY ? ?COUNTS:  YES ? ?PLAN OF CARE: Admit for overnight observation ? ?PATIENT DISPOSITION:  PACU - hemodynamically stable. ? ?INDICATION: Pleasant obese woman with interim episodes of upper abdominal pain suspicious for biliary colic and known gallstones.  Heartburn reflux and other chronic abdominal complaints stable.  I offered cholecystectomy. ? ?The anatomy & physiology of hepatobiliary & pancreatic function was discussed.  The pathophysiology of gallbladder dysfunction was discussed.  Natural history risks without surgery was discussed.   I feel the risks of no intervention will lead to serious problems that outweigh the operative risks; therefore, I recommended cholecystectomy to remove the pathology.  I explained laparoscopic techniques with possible need for an open approach.  Probable cholangiogram to  evaluate the bilary tract was explained as well.   ? ?Risks such as bleeding, infection, abscess, leak, injury to other organs, need for further treatment, heart attack, death, and other risks were discussed.  I noted a good likelihood this will help address the problem.  Possibility that this will not correct all abdominal symptoms was explained.  Goals of post-operative recovery were discussed as well.  We will work to minimize complications.  An educational handout further explaining the pathology and treatment options was given as well.  Questions were answered.  The patient expresses understanding & wishes to proceed with surgery. ? ?OR FINDINGS: Gallbladder wall thickening and changes with some adhesions consistent with at least chronic calculus cholecystitis.  Numerous stones 2 very large and 1 impacted in the infundibulum. ? ?Cholangiogram showing rather typical biliary anatomy with no evidence of choledocholithiasis, obstruction, leak. ? ?Liver: Probable fatty changes.  Core liver biopsy done x3 ? ?DESCRIPTION:  ? ?The patient was identified & brought in the operating room. The patient was positioned supine with arms tucked. SCDs were active during the entire case. The patient underwent general anesthesia without any difficulty.  The abdomen was prepped and draped in a sterile fashion. A Surgical Timeout confirmed our plan. ? ?I made a transverse curvilinear incision through the superior umbilical fold.  I placed a 28m long port through the supraumbilical fascia using a modified Hassan cutdown technique with umbilical stalk fascial countertraction. I began carbon dioxide insufflation.  No change in end tidal CO2 measurement.   Camera inspection revealed no injury. There were no adhesions to the anterior abdominal wall supraumbilically.  I proceeded to continue with single site technique. I placed a #5 port in left upper aspect of the wound. I placed  a 5 mm atraumatic grasper in the right inferior aspect of  the wound. ? ?I turned attention to the right upper quadrant.  Gallbladder was somewhat thickened and mildly distended consistent with chronic irritation.  No hard indication of acute inflammation at this time.  The gallbladder fundus was elevated cephalad. I freed adhesions to the ventral surface of the gallbladder off carefully.  I freed the peritoneal coverings between the gallbladder and the liver on the posteriolateral and anteriomedial walls. I alternated between Harmonic & blunt Maryland dissection to help get a good critical view of the cystic artery and cystic duct.  did further dissection to free 80%of the gallbladder off the liver bed to get a good critical view of the infundibulum and cystic duct. I dissected out the cystic artery; and, after getting a good 360? view, ligated the anterior & posterior branches of the cystic artery close on the infundibulum using the Harmonic ultrasonic dissection. ? ?I skeletonized the cystic duct.  I placed a clip on the infundibulum. I did a partial cystic duct-otomy and ensured patency. I placed a 5 [place] cholangiocatheter through a puncture site at the right subcostal ridge of the abdominal wall and directed it into the cystic duct.  We ran a cholangiogram with dilute radio-opaque contrast and continuous fluoroscopy. Contrast flowed from a side branch consistent with cystic duct cannulization. Contrast flowed up the common hepatic duct into the right and left intrahepatic chains out to secondary radicals. Contrast flowed down the common bile duct easily across the normal ampulla into the duodenum.  This was consistent with a normal cholangiogram. ? ?I removed the cholangiocatheter. I placed clips on the cystic duct x4.  I completed cystic duct transection. I freed the gallbladder from its remaining attachments to the liver.  Placed the gallbladder inside an EcoSac bag.   ? ?Because she had at least some fatty change in the liver on numerous medications I felt to be  reasonable to do liver biopsy.  Used a 14-gauge Tru-Cut needle through the right subcostal cholangiogram puncture site.  I did 4 passes and got 2 excellent 1 decent and 1 mild core.  Hemostasis ensured.  I ensured hemostasis on the gallbladder fossa of the liver and elsewhere. I inspected the rest of the abdomen & detected no injury nor bleeding elsewhere. ? ?I removed the gallbladder out the supraumbilical fascia. I closed the fascia transversely using #1 PDS interrupted stitches. I closed the skin using 4-0 monocryl stitch.  Sterile dressing was applied. The patient was extubated & arrived in the PACU in stable condition.. ? ?I had discussed postoperative care with the patient in the holding area. I discussed operative findings, updated the patient's status, discussed probable steps to recovery, and gave postoperative recommendations to the patient's significant other, Camelia Phenes  Recommendations were made.  Questions were answered.  He expressed understanding & appreciation. ? ?Belinda [name], M.D., F.A.C.S. ?Gastrointestinal and Minimally Invasive Surgery ?[facility] ?[address], [address] ?[place], [place] [address] ?[phone] Main / Paging ? ?06/10/2021 ?10:59 AM  ?

## 2021-06-10 NOTE — Progress Notes (Signed)
Pt is tired and in pain, refused cpt at this time. ?

## 2021-06-11 ENCOUNTER — Encounter (HOSPITAL_BASED_OUTPATIENT_CLINIC_OR_DEPARTMENT_OTHER): Payer: Self-pay | Admitting: Surgery

## 2021-06-11 DIAGNOSIS — Z8616 Personal history of COVID-19: Secondary | ICD-10-CM | POA: Diagnosis not present

## 2021-06-11 DIAGNOSIS — M21371 Foot drop, right foot: Secondary | ICD-10-CM | POA: Diagnosis present

## 2021-06-11 DIAGNOSIS — K7581 Nonalcoholic steatohepatitis (NASH): Secondary | ICD-10-CM | POA: Diagnosis present

## 2021-06-11 DIAGNOSIS — R1310 Dysphagia, unspecified: Secondary | ICD-10-CM | POA: Diagnosis present

## 2021-06-11 DIAGNOSIS — F410 Panic disorder [episodic paroxysmal anxiety] without agoraphobia: Secondary | ICD-10-CM | POA: Diagnosis present

## 2021-06-11 DIAGNOSIS — Z993 Dependence on wheelchair: Secondary | ICD-10-CM | POA: Diagnosis not present

## 2021-06-11 DIAGNOSIS — Z881 Allergy status to other antibiotic agents status: Secondary | ICD-10-CM | POA: Diagnosis not present

## 2021-06-11 DIAGNOSIS — K5909 Other constipation: Secondary | ICD-10-CM | POA: Diagnosis present

## 2021-06-11 DIAGNOSIS — R0602 Shortness of breath: Secondary | ICD-10-CM | POA: Insufficient documentation

## 2021-06-11 DIAGNOSIS — R0902 Hypoxemia: Secondary | ICD-10-CM | POA: Diagnosis not present

## 2021-06-11 DIAGNOSIS — Z886 Allergy status to analgesic agent status: Secondary | ICD-10-CM | POA: Diagnosis not present

## 2021-06-11 DIAGNOSIS — Z79899 Other long term (current) drug therapy: Secondary | ICD-10-CM | POA: Diagnosis not present

## 2021-06-11 DIAGNOSIS — K8010 Calculus of gallbladder with chronic cholecystitis without obstruction: Secondary | ICD-10-CM | POA: Diagnosis present

## 2021-06-11 DIAGNOSIS — F411 Generalized anxiety disorder: Secondary | ICD-10-CM | POA: Diagnosis present

## 2021-06-11 DIAGNOSIS — G894 Chronic pain syndrome: Secondary | ICD-10-CM | POA: Diagnosis present

## 2021-06-11 DIAGNOSIS — Z6841 Body Mass Index (BMI) 40.0 and over, adult: Secondary | ICD-10-CM | POA: Diagnosis not present

## 2021-06-11 DIAGNOSIS — Z888 Allergy status to other drugs, medicaments and biological substances status: Secondary | ICD-10-CM | POA: Diagnosis not present

## 2021-06-11 DIAGNOSIS — G4730 Sleep apnea, unspecified: Secondary | ICD-10-CM | POA: Diagnosis present

## 2021-06-11 DIAGNOSIS — F17210 Nicotine dependence, cigarettes, uncomplicated: Secondary | ICD-10-CM | POA: Diagnosis present

## 2021-06-11 DIAGNOSIS — K219 Gastro-esophageal reflux disease without esophagitis: Secondary | ICD-10-CM | POA: Diagnosis present

## 2021-06-11 DIAGNOSIS — Z635 Disruption of family by separation and divorce: Secondary | ICD-10-CM | POA: Diagnosis not present

## 2021-06-11 DIAGNOSIS — F429 Obsessive-compulsive disorder, unspecified: Secondary | ICD-10-CM | POA: Diagnosis present

## 2021-06-11 MED ORDER — NICOTINE 14 MG/24HR TD PT24
14.0000 mg | MEDICATED_PATCH | Freq: Every day | TRANSDERMAL | Status: DC
  Administered 2021-06-11 – 2021-06-12 (×2): 14 mg via TRANSDERMAL
  Filled 2021-06-11 (×2): qty 1

## 2021-06-11 MED ORDER — NICOTINE 14 MG/24HR TD PT24: 14.0000 mg | MEDICATED_PATCH | Freq: Every day | TRANSDERMAL | Status: DC

## 2021-06-11 NOTE — Plan of Care (Signed)
?  Problem: Education: ?Goal: Required Educational Video(s) ?Outcome: Progressing ?  ?Problem: Clinical Measurements: ?Goal: Ability to maintain clinical measurements within normal limits will improve ?Outcome: Progressing ?Goal: Postoperative complications will be avoided or minimized ?Outcome: Progressing ?  ?Problem: Skin Integrity: ?Goal: Demonstration of wound healing without infection will improve ?Outcome: Progressing ?  ?Problem: Education: ?Goal: Knowledge of General Education information will improve ?Description: Including pain rating scale, medication(s)/side effects and non-pharmacologic comfort measures ?Outcome: Progressing ?  ?Problem: Health Behavior/Discharge Planning: ?Goal: Ability to manage health-related needs will improve ?Outcome: Progressing ?  ?Problem: Clinical Measurements: ?Goal: Ability to maintain clinical measurements within normal limits will improve ?Outcome: Progressing ?Goal: Will remain free from infection ?Outcome: Progressing ?Goal: Diagnostic test results will improve ?Outcome: Progressing ?Goal: Respiratory complications will improve ?Outcome: Progressing ?Goal: Cardiovascular complication will be avoided ?Outcome: Progressing ?  ?Problem: Activity: ?Goal: Risk for activity intolerance will decrease ?Outcome: Progressing ?  ?Problem: Nutrition: ?Goal: Adequate nutrition will be maintained ?Outcome: Progressing ?  ?Problem: Coping: ?Goal: Level of anxiety will decrease ?Outcome: Progressing ?  ?Problem: Elimination: ?Goal: Will not experience complications related to bowel motility ?Outcome: Progressing ?Goal: Will not experience complications related to urinary retention ?Outcome: Progressing ?  ?Problem: Pain Managment: ?Goal: General experience of comfort will improve ?Outcome: Progressing ?  ?Problem: Safety: ?Goal: Ability to remain free from injury will improve ?Outcome: Progressing ?  ?Problem: Skin Integrity: ?Goal: Risk for impaired skin integrity will  decrease ?Outcome: Progressing ?  ?

## 2021-06-11 NOTE — Progress Notes (Signed)
Called to bedside by pt. Pt noted blood on her sheets from her umbilical dressing leaking. Dressing changed with wet to dry and transparent dressing overlay. ?

## 2021-06-11 NOTE — Progress Notes (Signed)
? ?Belinda [name] ?[id number] ?06-Dec-1980 ? ?CARE TEAM: ? ?PCP: Kathyrn Drown, MD ? ?Outpatient Care Team: Patient Care Team: ?Kathyrn Drown, MD as PCP - General (Family Medicine) ?Rourk, [name], MD as Consulting Physician (Gastroenterology) ?Kathyrn Drown, MD (Family Medicine) ?Brand Males, MD as Consulting Physician (Pulmonary Disease) ?Michael Boston, MD as Consulting Physician (General Surgery) ? ?Inpatient Treatment Team: Treatment Team: Attending Provider: Michael Boston, MD; Consulting Physician: Edison Pace Md, MD; Registered Nurse: Tanda Rockers, RN; Social Worker: Sherie Don, LCSW ? ? ?Problem List:  ? ?Principal Problem: ?  SOB (shortness of breath) ?Active Problems: ?  Generalized anxiety disorder ?  GERD (gastroesophageal reflux disease) ?  Morbid obesity ([place]) ?  OCD (obsessive compulsive disorder) ?  Chronic pain syndrome ?  Substance abuse ([place]) ?  Dyspnea on exertion ?  Right foot drop ?  Chronic calculous cholecystitis s/p lap cholecystectomy 06/10/2021 ? ? ?1 Day Post-Op  06/10/2021 ? ?Procedure(s): ?LAPAROSCOPIC CHOLECYSTECTOMY WITH INTRAOPERATIVE CHOLANGIOGRAM ?NEEDLE CORE BIOPSY OF LIVER ? ? ? ?Assessment ? ?Numerous complaints ? ?(Hospital Stay = 0 days) ? ?Plan: ? ?-Hypoxia seems to be resolving with diuresis.  Follow closely.  Resume inhalers. ?-Follow-up on pathology on cholecystitis. ?-Advance to solid diet ?-Try to mobilize better.  Patient has history of foot drop and balance issues with prior brain injury.  Anxious about being independent.  We will have physical therapy be involved nursing agrees. ?-Try to improve pain control.  Already on multimodal therapy.  Use tramadol as that is less habit-forming than oxycodone which bothered her and got her into trouble in the past. ?-VTE prophylaxis- SCDs, etc ?-mobilize as tolerated to help recovery ? ?Disposition:  ?Disposition:  ?The patient is from: Home ? ?Anticipate discharge to:  Home with Home Health ? ?Anticipated Date of  Discharge is:  April 21,2023 ?  ? ?Barriers to discharge:  Pending Clinical improvement (more likely than not) ? ?Patient currently is NOT MEDICALLY STABLE for discharge from the hospital from a surgery standpoint. ? ? ? ? ? ?I reviewed nursing notes, last 24 h vitals and pain scores, last 48 h intake and output, last 24 h labs and trends, and last 24 h imaging results. I have reviewed this patient's available data, including medical history, events of note, test results, etc as part of my evaluation.  A significant portion of that time was spent in counseling.  Care during the described time interval was provided by me. ? ?This care required moderate level of medical decision making.  06/11/2021 ? ? ? ?Subjective: ?(Chief complaint) ? ?Patient with less nausea. ? ?Still with significant pain and discomfort. ? ?Trying to mobilize but not independent yet. ? ?Worried about going home. ? ?Objective: ? ?Vital signs: ? ?Vitals:  ? 06/11/21 0720 06/11/21 3903 06/11/21 0857 06/11/21 1407  ?BP: 117/84  (!) 135/95 114/71  ?Pulse: 65  67 75  ?Resp: 18  18 18   ?Temp: 98.5 ?F (36.9 ?C)  98 ?F (36.7 ?C) (!) 97.5 ?F (36.4 ?C)  ?TempSrc: Oral  Oral Oral  ?SpO2: 95% 96% 97% 94%  ?Weight:      ?Height:      ? ? ?Last BM Date : 06/08/21 ? ?Intake/Output  ? ?Yesterday: ? 04/19 0701 - 04/20 0700 ?In: 2572.6 [P.O.:1380; I.V.:1192.6] ?Out: 70 [Urine:50; Blood:30] ?This shift: ? Total I/O ?In: 240 [P.O.:240] ?Out: 400 [Urine:400] ? ?Bowel function: ? Flatus: YES ? BM:  No ? Drain: (No drain) ? ? ?Physical Exam: ? ?  General: Pt awake/alert in mild acute distress ?Eyes: PERRL, normal EOM.  Sclera clear.  No icterus ?Neuro: CN II-XII intact w/o focal sensory/motor deficits. ?Lymph: No head/neck/groin lymphadenopathy ?Psych: Again with pressured speech interrupting but very pleasant.  Consolable no delerium/psychosis/paranoia.  Oriented x 4 ?HENT: Normocephalic, Mucus membranes moist.  No thrush ?Neck: Supple, No tracheal deviation.  No obvious  thyromegaly ?Chest: No pain to chest wall compression.  Good respiratory excursion.  No audible wheezing ?CV:  Pulses intact.  Regular rhythm.  No major extremity edema ?MS: Normal AROM mjr joints.  No obvious deformity ? ?Abdomen: Soft.  Mildy distended.  Mildly tender at incisions only.  No evidence of peritonitis.  No incarcerated hernias. ? ?Ext:   No deformity.  No mjr edema.  No cyanosis ?Skin: No petechiae / purpurea.  No major sores.  Warm and dry ? ? ? ?Results:  ? ?Cultures: ?No results found for this or any previous visit (from the past 720 hour(s)). ? ?Labs: ?Results for orders placed or performed during the hospital encounter of 06/10/21 (from the past 48 hour(s))  ?Pregnancy, urine POC     Status: None  ? Collection Time: 06/10/21  7:13 AM  ?Result Value Ref Range  ? Preg Test, Ur NEGATIVE NEGATIVE  ?  Comment:        ?THE SENSITIVITY OF THIS ?METHODOLOGY IS >24 mIU/mL ?  ?Comprehensive metabolic panel per protocol     Status: Abnormal  ? Collection Time: 06/10/21  8:05 AM  ?Result Value Ref Range  ? Sodium 137 135 - 145 mmol/L  ? Potassium 3.9 3.5 - 5.1 mmol/L  ? Chloride 106 98 - 111 mmol/L  ? CO2 24 22 - 32 mmol/L  ? Glucose, Bld 97 70 - 99 mg/dL  ?  Comment: Glucose reference range applies only to samples taken after fasting for at least 8 hours.  ? BUN 11 6 - 20 mg/dL  ? Creatinine, Ser 0.88 0.44 - 1.00 mg/dL  ? Calcium 8.5 (L) 8.9 - 10.3 mg/dL  ? Total Protein 7.8 6.5 - 8.1 g/dL  ? Albumin 4.3 3.5 - 5.0 g/dL  ? AST 71 (H) 15 - 41 U/L  ? ALT 85 (H) 0 - 44 U/L  ? Alkaline Phosphatase 79 38 - 126 U/L  ? Total Bilirubin 1.0 0.3 - 1.2 mg/dL  ? GFR, Estimated >60 >60 mL/min  ?  Comment: (NOTE) ?Calculated using the CKD-EPI Creatinine Equation (2021) ?  ? Anion gap 7 5 - 15  ?  Comment: Performed at [facility], [place] [address], [place], [place] [address]  ? ? ?Imaging / Studies: ?DG Cholangiogram Operative ? ?Result Date: 06/10/2021 ?CLINICAL DATA:  Intraoperative cholangiogram during  laparoscopic cholecystectomy. EXAM: INTRAOPERATIVE CHOLANGIOGRAM FLUOROSCOPY TIME:  15 seconds COMPARISON:  Abdominal MRI-02/17/2021 FINDINGS: Intraoperative cholangiographic images of the right upper abdominal quadrant during laparoscopic cholecystectomy are provided for review. Surgical clips overlie the expected location of the gallbladder fossa. Contrast injection demonstrates selective cannulation of the central aspect of the cystic duct. There is passage of contrast through the central aspect of the cystic duct with filling of a non dilated common bile duct. There is passage of contrast though the CBD and into the descending portion of the duodenum. There is minimal reflux of injected contrast into the common hepatic duct and central aspect of the non dilated intrahepatic biliary system. Nonocclusive filling defect within the distal aspect of the CBD may represent a fold associated with the ampulla though alternatively  nonocclusive choledocholithiasis could have a similar appearance. IMPRESSION: Nonocclusive filling defect within the distal aspect CBD, potentially a fold associated with the ampulla though conceivably nonocclusive choledocholithiasis could have a similar appearance. Correlation with the operative report is advised. Electronically Signed   By: Sandi Mariscal M.D.   On: 06/10/2021 11:05   ? ?Medications / Allergies: per chart ? ?Antibiotics: ?Anti-infectives (From admission, onward)  ? ? Start     Dose/Rate Route Frequency Ordered Stop  ? 06/10/21 0700  cefTRIAXone (ROCEPHIN) 2 g in sodium chloride 0.9 % 100 mL IVPB       ? 2 g ?200 mL/hr over 30 Minutes Intravenous On call to O.R. 06/10/21 0657 06/10/21 1006  ? ?  ? ? ? ? ?Note: ?Portions of this report may have been transcribed using voice recognition software. Every effort was made to ensure accuracy; however, inadvertent computerized transcription errors may be present.   Any transcriptional errors that result from this process are  unintentional. ? ? ? ?Adin Hector, MD, FACS, MASCRS ?Esophageal, Gastrointestinal & Colorectal Surgery ?Robotic and Minimally Invasive Surgery ? ?[facility] ?[facility], [place]

## 2021-06-11 NOTE — Progress Notes (Signed)
Transition of Care (TOC) Screening Note ? ?Patient Details  ?Name: Belinda [name] ?Date of Birth: 10/14/1980 ? ?Transition of Care (TOC) CM/SW Contact:    ?Sherie Don, LCSW ?Phone Number: ?06/11/2021, 11:40 AM ? ?Transition of Care Department [facility]) has reviewed patient and no TOC needs have been identified at this time. We will continue to monitor patient advancement through interdisciplinary progression rounds. If new patient transition needs arise, please place a TOC consult. ?

## 2021-06-12 LAB — SURGICAL PATHOLOGY

## 2021-06-12 MED ORDER — METHOCARBAMOL 1000 MG PO TABS: 1000.0000 mg | ORAL_TABLET | Freq: Four times a day (QID) | ORAL | 2 refills | Status: DC | PRN

## 2021-06-12 NOTE — Discharge Summary (Addendum)
?Physician Discharge Summary  ? ? ?Patient ID: ?Belinda [name] ?MRN: [id number] ?DOB/AGE: 1980/09/07  ?41 y.o. ? ?Patient Care Team: ?Kathyrn Drown, MD as PCP - General (Family Medicine) ?Rourk, [name], MD as Consulting Physician (Gastroenterology) ?Kathyrn Drown, MD (Family Medicine) ?Brand Males, MD as Consulting Physician (Pulmonary Disease) ?Michael Boston, MD as Consulting Physician (General Surgery) ? ?Admit date: 06/10/2021 ? ?Discharge date: 06/12/2021 ? ?Hospital Stay = 1 days ? ? ? ?Discharge Diagnoses:  ?Principal Problem: ?  SOB (shortness of breath) ?Active Problems: ?  Generalized anxiety disorder ?  GERD (gastroesophageal reflux disease) ?  Morbid obesity ([place]) ?  OCD (obsessive compulsive disorder) ?  Chronic pain syndrome ?  Substance abuse ([place]) ?  Dyspnea on exertion ?  Right foot drop ?  Chronic calculous cholecystitis s/p lap cholecystectomy 06/10/2021 ? ? ?2 Days Post-Op  06/10/2021 ? ?POST-OPERATIVE DIAGNOSIS:  ? ?SYMPTOMATIC BILIARY COLIC, PROBABLE CHRONIC CHOLECYSTITIS ? ?SURGERY:  06/10/2021 ? ?Procedure(s): ?LAPAROSCOPIC CHOLECYSTECTOMY WITH INTRAOPERATIVE CHOLANGIOGRAM ?NEEDLE CORE BIOPSY OF LIVER ? ?SURGEON:   ? ?Surgeon(s): ?Michael Boston, MD ? ?Consults: Physical Therapy and Case Management / Social Work ? ?Hospital Course:  ? ?The patient underwent the surgery above.  Postoperatively, the patient gradually mobilized and advanced to a solid diet.  Pain and other symptoms were treated aggressively.   ? ?By the time of discharge, the patient was walking well the hallways, eating food, having flatus.  Pain was well-controlled on an oral medications.  Based on meeting discharge criteria and continuing to recover, I felt it was safe for the patient to be discharged from the hospital to further recover with close followup.  Patient was very concerned about support from home and her ability to mobilize but these are chronic issues.  I do have physical and Occupational Therapy and  social work work with nursing to figure out what support can be helped to be arranged.  It was felt by physical therapy patient could benefit from outpatient physical therapy so orders were placed.  Patient with concerns about transportation issues which we worked to help address.  Postoperative recommendations were discussed in detail.  They are written as well. ? ?Discharged Condition: good ? ?Discharge Exam: ?Blood pressure 113/81, pulse 66, temperature 98.1 ?F (36.7 ?C), temperature source Oral, resp. rate 18, height 5' 1"  (1.549 m), weight 114 kg, last menstrual period 02/17/2021, SpO2 96 %. ? ?General: Pt awake/alert/oriented x4 in No acute distress ?Eyes: PERRL, normal EOM.  Sclera clear.  No icterus ?Neuro: CN II-XII intact w/o focal sensory/motor deficits. ?Lymph: No head/neck/groin lymphadenopathy ?Psych:  No delerium/psychosis/paranoia ?HENT: Normocephalic, Mucus membranes moist.  No thrush ?Neck: Supple, No tracheal deviation ?Chest:  No chest wall pain w good excursion ?CV:  Pulses intact.  Regular rhythm ?MS: Normal AROM mjr joints.  No obvious deformity ?Abdomen: Soft.  Nondistended.  Mildly tender at incisions only.  No evidence of peritonitis.  No incarcerated hernias. ?Ext:  SCDs BLE.  No mjr edema.  No cyanosis ?Skin: No petechiae / purpura ? ? ?Disposition:  ? ? Follow-up Information   ? ? Michael Boston, MD Follow up.   ?Specialties: General Surgery, Colon and Rectal Surgery ?Contact information: ?[place] ?[address] ?[place] [address] ?[phone] ? ? ?  ?  ? ?  ?  ? ?  ? ? ?Discharge disposition: 01-Home or Self Care ? ? ? ? ? ? ?Discharge Instructions   ? ? Ambulatory referral to Physical Therapy   Complete by:  As directed ?  ? May switch location pending patient preference  ? Call MD for:   Complete by: As directed ?  ? FEVER >101.5 F ?(Temperatures <101.30F occasionally happen and are not significant)  ? Call MD for:  extreme fatigue   Complete by: As directed ?  ? Call MD for:   persistant dizziness or light-headedness   Complete by: As directed ?  ? Call MD for:  persistant nausea and vomiting   Complete by: As directed ?  ? Call MD for:  redness, tenderness, or signs of infection (pain, swelling, redness, odor or green/yellow discharge around incision site)   Complete by: As directed ?  ? Call MD for:  severe uncontrolled pain   Complete by: As directed ?  ? Diet - low sodium heart healthy   Complete by: As directed ?  ? Follow a light diet the first few days at home.  Start with a bland diet such as soups, liquids, starchy foods, low fat foods, etc.   ?If you feel full, bloated, or constipated, stay on a full liquid or pureed/blenderized diet for a few days until you feel better and no longer constipated. ?Gradually get back to a regular solid diet.  Avoid fast food or heavy meals the first week as you are more likely to get nauseated.  ? Discharge instructions   Complete by: As directed ?  ? One the day of your discharge from the hospital (or the next business weekday), please call [facility] to set up or confirm an appointment to see your surgeon in the office for a follow-up appointment.  Usually it is 2-3 weeks after your surgery. ? ?Other concerns ?If you are not getting better after two weeks or are noticing you are getting worse, contact our office [phone] for further advice.  We may need to adjust your medications, re-evaluate you in the office, send you to the emergency room, or see what other things we can do to help. ?The clinic staff is available to answer your questions during regular business hours (8:30am-5pm).  Please don't hesitate to call and ask to speak to one of our nurses for clinical concerns.    ?A surgeon from [facility] is always on call at the hospitals 24 hours/day ?If you have a medical emergency, go to the nearest emergency room or call 911.  ? Discharge wound care:   Complete by: As directed ?  ? It is good for closed  incisions and even open wounds to be washed every day.  Shower every day.  Short baths are fine.  Wash the incisions and wounds clean with soap & water.    ?You may leave closed incisions open to air if it is dry.   You may cover the incision with clean gauze & replace it after your daily shower for comfort. ? ?TEGADERM:  You have clear gauze band-aid dressings over your closed incision(s).  Remove the dressings 3 days after surgery.  ? Driving Restrictions   Complete by: As directed ?  ? You may drive when you are no longer taking prescription pain medication, you can comfortably wear a seatbelt, and you can safely maneuver your car and apply brakes.  ? Increase activity slowly   Complete by: As directed ?  ? Lifting restrictions   Complete by: As directed ?  ? You may resume regular (light) daily activities beginning the next day-such as daily self-care, walking, climbing stairs-gradually increasing activities as  tolerated.   ?If you can walk 30 minutes without difficulty, it is safe to try more intense activity such as jogging, treadmill, bicycling, low-impact aerobics, swimming, etc. ?Save the most intensive and strenuous activity for last such as sit-ups, heavy lifting, contact sports, etc   ?Refrain from any heavy lifting or straining until you are off narcotics for pain control.   ?DO NOT PUSH THROUGH PAIN.   ?Let pain be your guide: If it hurts to do something, don't do it.   ?Pain is your body warning you to avoid that activity for another week until the pain goes down.  ? May shower / Bathe   Complete by: As directed ?  ? Wash / shower every day.  You may shower over the dressings as they are waterproof.  Continue to shower over incision(s) after the dressing is off.  ? May walk up steps   Complete by: As directed ?  ? Sexual Activity Restrictions   Complete by: As directed ?  ? You may have sexual intercourse when it is comfortable. If it hurts to do something, stop.  ? ?  ? ? ?Allergies as of 06/12/2021    ? ?   Reactions  ? Aspirin   ? headache  ? Buspar [buspirone] Other (See Comments)  ? Mania/insomnia  ? Celexa [citalopram Hydrobromide] Other (See Comments)  ? Insomnia/mania  ? Wellbutrin [bupropion] Other (S

## 2021-06-12 NOTE — Evaluation (Signed)
Occupational Therapy Evaluation ?Patient Details ?Name: Belinda [name] ?MRN: [id number] ?DOB: 14-Apr-1980 ?Today's Date: 06/12/2021 ? ? ?History of Present Illness Pleasant but anxious 40 year old woman with multiple medical issues. Chronic heartburn. Chronic constipation. She has a history of being on some chronic opioid maintenance for chronic pain. Anxiety. Morbid obesity. Tobacco abuse. 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 Patient presented with abdominal pain and is now s/p lap choley.  ? ?Clinical Impression ?  ?Belinda [name] is a 41 year old woman who presents with above medical history. Patient reports 10/10 pain in abdomen but can be distracted. Patient is very loquacious and very detailed in regards to her medical history.  On evaluation she presents with generalized weakness, decreased activity tolerance, impaired balance, right foot drop, poor cardiopulmonary endurance and with pain. However patient's impairments are not new and patient is at her baseline in regards to functional mobility and ADLs - needing assistance with LB ADLs, at times for transfers and a high fall risk. Due to patient's insurance - which is known to not provide much [facility] services - therapist recommends patient pursue OP therapy services and patient is agreeable. Encouraged patient to ask primary care doctor for therapy orders. Patient has no acute care OT neesd.  ?   ? ?Recommendations for follow up therapy are one component of a multi-disciplinary discharge planning process, led by the attending physician.  Recommendations may be updated based on patient status, additional functional criteria and insurance authorization.  ? ?Follow Up Recommendations ? Outpatient OT  ?  ?Assistance Recommended at Discharge Frequent or constant Supervision/Assistance  ?Patient can return home with the following A little help with walking and/or  transfers;A lot of help with bathing/dressing/bathroom;Assistance with cooking/housework;Help with stairs or ramp for entrance;Assist for transportation ? ?  ?Functional Status Assessment ? Patient has not had a recent decline in their functional status  ?Equipment Recommendations ? None recommended by OT  ?  ?Recommendations for Other Services   ? ? ?  ?Precautions / Restrictions Precautions ?Precautions: Fall ?Precaution Comments: Right foot drop (no significant findings on imaging but had right sided weakness during prior hospitilization) ?Restrictions ?Weight Bearing Restrictions: No  ? ?  ? ?Mobility Bed Mobility ?Overal bed mobility: Needs Assistance ?Bed Mobility: Supine to Sit ?  ?  ?Supine to sit: Supervision, HOB elevated ?  ?  ?  ?  ? ?Transfers ?Overall transfer level: Needs assistance ?Equipment used: Rolling walker (2 wheels) ?Transfers: Sit to/from Stand, Bed to chair/wheelchair/BSC ?Sit to Stand: Min guard, From elevated surface ?  ?  ?Step pivot transfers: Min guard, +2 safety/equipment ?  ?  ?General transfer comment: Ambulated with RW with min assist. Foot drop impairing ambulation though this is her baseline. Fatigues quickly. ?  ? ?  ?Balance Overall balance assessment: Needs assistance ?Sitting-balance support: No upper extremity supported, Feet supported ?Sitting balance-Leahy Scale: Fair ?  ?  ?Standing balance support: Reliant on assistive device for balance ?Standing balance-Leahy Scale: Poor ?  ?  ?  ?  ?  ?  ?  ?  ?  ?  ?  ?  ?   ? ?ADL either performed or assessed with clinical judgement  ? ?ADL Overall ADL's : At baseline ?  ?  ?  ?  ?  ?  ?  ?  ?  ?  ?  ?  ?  ?  ?  ?  ?  ?  ?  ?   ? ? ? ?  Vision Patient Visual Report: No change from baseline ?   ?   ?Perception   ?  ?Praxis   ?  ? ?Pertinent Vitals/Pain Pain Assessment ?Pain Assessment: 0-10 ?Pain Score: 10-Worst pain ever ?Pain Location: abdomen, shingles on back ?Pain Descriptors / Indicators: Sharp, Burning ?Pain Intervention(s):  Monitored during session, Premedicated before session  ? ? ? ?Hand Dominance Right ?  ?Extremity/Trunk Assessment Upper Extremity Assessment ?Upper Extremity Assessment: Overall WFL for tasks assessed ?  ?  ?  ?  ?  ?Communication Communication ?Communication: No difficulties ?  ?Cognition Arousal/Alertness: Awake/alert ?Behavior During Therapy: Anxious ?Overall Cognitive Status: Within Functional Limits for tasks assessed ?  ?  ?  ?  ?  ?  ?  ?  ?  ?  ?  ?  ?  ?  ?  ?  ?  ?  ?  ?General Comments    ? ?  ?Exercises   ?  ?Shoulder Instructions    ? ? ?Home Living Family/patient expects to be discharged to:: Private residence ?Living Arrangements: Children;Spouse/significant other (boyfriend comes and goes) ?Available Help at Discharge: Family;Available 24 hours/day (boyfriend during the day, son at night (43)) ?Type of Home: Apartment ?Home Access: Level entry ?  ?  ?Home Layout: One level ?  ?  ?Bathroom Shower/Tub: Tub/shower unit ?  ?Bathroom Toilet: Handicapped height ?Bathroom Accessibility: Yes ?How Accessible: Accessible via wheelchair ?Home Equipment: Wheelchair - Publishing copy (2 wheels);Cane - quad;Shower seat;Grab bars - tub/shower;Grab bars - toilet ?  ?Additional Comments: sleeps on the couch ?  ? ?  ?Prior Functioning/Environment Prior Level of Function : Needs assist ?  ?  ?  ?  ?  ?  ?Mobility Comments: walker in home, wc outside ?ADLs Comments: assistance with LB ADLs, able to toilet herself though difficult to wipe ?  ? ?  ?  ?OT Problem List: Decreased strength;Decreased range of motion;Decreased activity tolerance;Impaired balance (sitting and/or standing);Obesity;Pain ?  ?   ?OT Treatment/Interventions:    ?  ?OT Goals(Current goals can be found in the care plan section) Acute Rehab OT Goals ?OT Goal Formulation: All assessment and education complete, DC therapy  ?OT Frequency:   ?  ? ?Co-evaluation   ?  ?  ?  ?  ? ?  ?AM-PAC OT "6 Clicks" Daily Activity     ?Outcome Measure Help from  another person eating meals?: None ?Help from another person taking care of personal grooming?: A Little ?Help from another person toileting, which includes using toliet, bedpan, or urinal?: A Little ?Help from another person bathing (including washing, rinsing, drying)?: A Lot ?Help from another person to put on and taking off regular upper body clothing?: A Little ?Help from another person to put on and taking off regular lower body clothing?: A Lot ?6 Click Score: 17 ?  ?End of Session Equipment Utilized During Treatment: Rolling walker (2 wheels) ?Nurse Communication: Mobility status ? ?Activity Tolerance: Patient tolerated treatment well ?Patient left: in chair;with call bell/phone within reach;with chair alarm set ? ?OT Visit Diagnosis: Muscle weakness (generalized) (M62.81);Pain  ?              ?Time: 6010-9323 ?OT Time Calculation (min): 41 min ?Charges:  OT General Charges ?$OT Visit: 1 Visit ?OT Evaluation ?$OT Eval Moderate Complexity: 1 Mod ? ?Soloman Mckeithan, OTR/L ?Acute Care Rehab Services  ?Office [phone] ?Pager: [phone]  ? ?Legacy Carrender L Cobin Cadavid ?06/12/2021, 1:28 PM ?

## 2021-06-12 NOTE — Evaluation (Signed)
Physical Therapy Evaluation ?Patient Details ?Name: Belinda [name] ?MRN: [id number] ?DOB: Mar 06, 1980 ?Today's Date: 06/12/2021 ? ?History of Present Illness ? Pleasant but anxious 41 year old woman with multiple medical issues. Chronic heartburn. Chronic constipation. She has a history of being on some chronic opioid maintenance for chronic pain. Anxiety. Morbid obesity. Tobacco abuse. 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 Patient presented with abdominal pain and is now s/p lap choley.  ?Clinical Impression ?  Belinda [name] is a 40 year old woman who presents with above medical history. Patient reports 10/10 pain in abdomen but can be distracted. Patient is very loquacious and very detailed in regards to her medical history.  On evaluation she presents with generalized weakness, decreased activity tolerance, impaired balance, right foot drop, poor cardiopulmonary endurance and with pain. However patient's impairments are not new and patient is near her baseline in regards to functional mobility and ADLs - needing assistance with LB ADLs, at times for transfers and a high fall risk. Due to patient's insurance - which is known to not provide much [facility] services - therapist recommends patient pursue OP therapy services and patient is agreeable. Encouraged patient to ask primary care doctor for therapy orders. PT services will follow pt during acute stay. ?   ? ?Recommendations for follow up therapy are one component of a multi-disciplinary discharge planning process, led by the attending physician.  Recommendations may be updated based on patient status, additional functional criteria and insurance authorization. ? ?Follow Up Recommendations Outpatient PT ? ?  ?Assistance Recommended at Discharge Intermittent Supervision/Assistance  ?Patient can return home with the following ? A little help with walking and/or  transfers;A little help with bathing/dressing/bathroom;Assistance with cooking/housework;Assist for transportation;Help with stairs or ramp for entrance ? ?  ?Equipment Recommendations None recommended by PT  ?Recommendations for Other Services ?    ?  ?Functional Status Assessment Patient has had a recent decline in their functional status and demonstrates the ability to make significant improvements in function in a reasonable and predictable amount of time.  ? ?  ?Precautions / Restrictions Precautions ?Precautions: Fall ?Precaution Comments: Right foot drop (no significant findings on imaging but had right sided weakness during prior hospitilization) ?Restrictions ?Weight Bearing Restrictions: No  ? ?  ? ?Mobility ? Bed Mobility ?Overal bed mobility: Needs Assistance ?Bed Mobility: Supine to Sit ?  ?  ?Supine to sit: Supervision, HOB elevated ?  ?  ?General bed mobility comments: Use of bed rail but no physical assist ?  ? ?Transfers ?Overall transfer level: Needs assistance ?Equipment used: Rolling walker (2 wheels) ?Transfers: Sit to/from Stand, Bed to chair/wheelchair/BSC ?Sit to Stand: Min guard, From elevated surface ?  ?Step pivot transfers: Min guard, +2 safety/equipment ?  ?  ?  ?General transfer comment: steady assist to stand with cues for use of UEs to self assist ?  ? ?Ambulation/Gait ?Ambulation/Gait assistance: Min assist, Mod assist, +2 safety/equipment ?Gait Distance (Feet): 26 Feet (and additional 10') ?Assistive device: Rolling walker (2 wheels) ?Gait Pattern/deviations: Step-to pattern, Step-through pattern, Decreased step length - right, Decreased step length - left, Shuffle, Trunk flexed, Knees buckling ?Gait velocity: decr ?  ?  ?General Gait Details: Cues for posture, position from RW and sequence.  Noted drop foot on R with compensating steppage gait; Buckling at knees (R >L); general instability increasing with increased fatigue and pt attempt to move to reciprocal gait. ? ?Stairs ?  ?   ?  ?  ?  ? ?  Wheelchair Mobility ?  ? ?Modified Rankin (Stroke Patients Only) ?  ? ?  ? ?Balance   ?  ?  ?  ?  ?  ?  ?  ?  ?  ?  ?  ?  ?  ?  ?  ?  ?  ?  ?   ? ? ? ?Pertinent Vitals/Pain Pain Assessment ?Pain Assessment: 0-10 ?Pain Score: 10-Worst pain ever ?Pain Location: abdomen, shingles on back ?Pain Descriptors / Indicators: Sharp, Burning ?Pain Intervention(s): Limited activity within patient's tolerance, Monitored during session, Premedicated before session, Patient requesting pain meds-RN notified, Heat applied  ? ? ?Home Living Family/patient expects to be discharged to:: Private residence ?Living Arrangements: Children;Spouse/significant other ?Available Help at Discharge: Family;Available 24 hours/day ?Type of Home: Apartment ?Home Access: Level entry ?  ?  ?  ?Home Layout: One level ?Home Equipment: Wheelchair - Publishing copy (2 wheels);Cane - quad;Shower seat;Grab bars - tub/shower;Grab bars - toilet ?Additional Comments: sleeps on the couch or in recliner - states easier to get in and out than bed  ?  ?Prior Function Prior Level of Function : Needs assist ?  ?  ?  ?  ?  ?  ?Mobility Comments: walker in home, wc outside ?ADLs Comments: assistance with LB ADLs, able to toilet herself though difficult to wipe ?  ? ? ?Hand Dominance  ? Dominant Hand: Right ? ?  ?Extremity/Trunk Assessment  ? Upper Extremity Assessment ?Upper Extremity Assessment: Overall WFL for tasks assessed ?  ? ?Lower Extremity Assessment ?Lower Extremity Assessment: RLE deficits/detail;Generalized weakness ?RLE Deficits / Details: AROM WFL with exception of ankle DF to neutral only.  DF strength 3-/5 with hip and knee strength 3+/5 ?RLE Sensation:  (Pt states R LE is hypersensitive to touch) ?RLE Coordination: decreased fine motor;decreased gross motor ?  ? ?   ?Communication  ? Communication: No difficulties  ?Cognition Arousal/Alertness: Awake/alert ?Behavior During Therapy: Anxious, Impulsive ?Overall Cognitive Status:  Within Functional Limits for tasks assessed ?  ?  ?  ?  ?  ?  ?  ?  ?  ?  ?  ?  ?  ?  ?  ?  ?  ?  ?  ? ?  ?General Comments   ? ?  ?Exercises    ? ?Assessment/Plan  ?  ?PT Assessment Patient needs continued PT services  ?PT Problem List Decreased strength;Decreased range of motion;Decreased activity tolerance;Decreased balance;Decreased mobility;Decreased coordination;Decreased knowledge of use of DME;Decreased safety awareness;Obesity;Pain ? ?   ?  ?PT Treatment Interventions DME instruction;Gait training;Functional mobility training;Therapeutic activities;Therapeutic exercise;Balance training;Patient/family education   ? ?PT Goals (Current goals can be found in the Care Plan section)  ?Acute Rehab PT Goals ?Patient Stated Goal: Regain IND ?PT Goal Formulation: With patient ?Time For Goal Achievement: 06/25/21 ?Potential to Achieve Goals: Fair ? ?  ?Frequency Min 3X/week ?  ? ? ?Co-evaluation PT/OT/SLP Co-Evaluation/Treatment: Yes ?Reason for Co-Treatment: For patient/therapist safety ?PT goals addressed during session: Mobility/safety with mobility ?OT goals addressed during session: ADL's and self-care ?  ? ? ?  ?AM-PAC PT "6 Clicks" Mobility  ?Outcome Measure Help needed turning from your back to your side while in a flat bed without using bedrails?: A Little ?Help needed moving from lying on your back to sitting on the side of a flat bed without using bedrails?: A Little ?Help needed moving to and from a bed to a chair (including a wheelchair)?: A Little ?Help needed standing up from a chair using  your arms (e.g., wheelchair or bedside chair)?: A Little ?Help needed to walk in hospital room?: A Lot ?Help needed climbing 3-5 steps with a railing? : Total ?6 Click Score: 15 ? ?  ?End of Session Equipment Utilized During Treatment: Gait belt ?Activity Tolerance: Patient limited by fatigue;Patient tolerated treatment well ?Patient left: in chair;with call bell/phone within reach ?Nurse Communication: Mobility  status ?PT Visit Diagnosis: Unsteadiness on feet (R26.81);Repeated falls (R29.6);Muscle weakness (generalized) (M62.81);Difficulty in walking, not elsewhere classified (R26.2);Pain ?Pain - part of body:  (abdomen) ?  ? ?

## 2021-06-12 NOTE — TOC Transition Note (Addendum)
Transition of Care (TOC) - CM/SW Discharge Note ? ?Patient Details  ?Name: Belinda [name] ?MRN: [id number] ?Date of Birth: 10-28-1980 ? ?Transition of Care (TOC) CM/SW Contact:  ?Sherie Don, LCSW ?Phone Number: ?06/12/2021, 2:08 PM ? ?Clinical Narrative: PT and OT recommended OP and the referral was sent by Dr. Johney Maine. TOC notified patient was requesting ride to get assistance in the house, but patient is ambulatory and not eligible for PTAR and TOC cannot provide transportation for prearranged surgeries as patients need to have a ride set up for getting to and from the hospital. Of note, even with a taxi voucher the driver will not assist with getting the patient into the home. Patient has told nursing staff she does not want to leave until 5:30am, but patient is not a Medicare patient and does not have the right to appeal her discharge. TOC signing off. ? ?Final next level of care: OP Rehab ?Barriers to Discharge: Barriers Resolved ? ?Patient Goals and CMS Choice ?Choice offered to / list presented to : NA ? ?Discharge Plan and Services       ?DME Arranged: N/A ?DME Agency: NA ? ?Readmission Risk Interventions ?   ? View : No data to display.  ?  ?  ?  ? ?

## 2021-06-29 ENCOUNTER — Encounter: Payer: Self-pay | Admitting: Family Medicine

## 2021-06-29 ENCOUNTER — Telehealth: Payer: Self-pay | Admitting: *Deleted

## 2021-06-29 ENCOUNTER — Telehealth: Payer: Self-pay

## 2021-06-29 MED ORDER — SULFAMETHOXAZOLE-TRIMETHOPRIM 800-160 MG PO TABS: 1.0000 | ORAL_TABLET | Freq: Two times a day (BID) | ORAL | 0 refills | Status: DC

## 2021-06-29 NOTE — Telephone Encounter (Signed)
Left message to return call 

## 2021-06-29 NOTE — Telephone Encounter (Signed)
This is clearly an abscess that is draining ?Antibiotics recommended ?Warm compresses recommended ?Office visit recommended if she has the ability to see the surgeon that would be the best if she is unable to see them she can be seen with [place] or ER ASAP ?

## 2021-06-29 NOTE — Telephone Encounter (Addendum)
Patient calls and stated she has a sore on her panty line from sitting- started as a little bump that popped- patient states she is worries about getting MRSA and would like to know what she can do about it and does she need antibiotic. ? ?Patient states she just had gallbladder surgery ?

## 2021-06-29 NOTE — Telephone Encounter (Signed)
She may do warm compresses frequently ?Reasonable to do Bactrim DS 1 twice taken twice daily for 7 days ?If it gets worse we will need to see her ?

## 2021-06-29 NOTE — Telephone Encounter (Signed)
Nurses-as for tramadol she may utilize tramadol 50 mg 1 taken 3 times daily as needed for pain #15 with 1 refill May pend then I will sign ? ?As for the follow-up with the surgeon it is still important that she follows up with her surgeon-I am not a surgical specialist. ?

## 2021-06-29 NOTE — Telephone Encounter (Signed)
Pt is calling back to [facility] regarding boil on her butt also for traMADol (ULTRAM) 50 MG tablet   ?

## 2021-06-29 NOTE — Telephone Encounter (Signed)
Pt returned call. Pt states she has had a bad experience with Dr.Gross. Pt would like Dr.Scott to take over "check up" on surgical site in 4-5 days. Pt states she does not want to see Dr.Gross anymore. Boil on bottom is about the sized of 50 cent piece with hole in the middle. Dr.Gross nurse called pt to let her know to discontinue antibiotic. Pt has been taking 3-4 Tramadol; pt has 2 left. Pt would like enough Tramadol until boil is gone. Please advise. Thank you.  ?

## 2021-06-29 NOTE — Telephone Encounter (Signed)
Patient notified and verbalized understanding. Prescription sent electronically to pharmacy. ?

## 2021-06-30 NOTE — Telephone Encounter (Signed)
Patient informed of md message. Verbalized understanding. ?

## 2021-06-30 NOTE — Telephone Encounter (Signed)
Nurses-with patient being on bupropion the electronic system will not allow me to prescribe tramadol.  That is because there can be drug interaction. ?Essentially can use Tylenol or ibuprofen for additional discomfort or she can talk with her treatment center to see if they allow her to use any other medication ?(Please be aware that if the treatment center says that she can be on additional medication I would have to have a written letter regarding that plus also I would not feel comfortable utilizing medicine more than 4 or 5 days) ? ?As for the abscess it would be wise for her to follow-up with surgical specialist ?

## 2021-06-30 NOTE — Telephone Encounter (Signed)
Patient notified and would like to know if there is a numbing cream or something  like that she can take other than Tylenol or Advil to help with the pain till the antibiotic kicks in.  ?

## 2021-06-30 NOTE — Telephone Encounter (Signed)
Left message to return call 

## 2021-06-30 NOTE — Telephone Encounter (Signed)
Unfortunately no ?It is recommended for her to get in with her specialist ?

## 2021-07-03 ENCOUNTER — Other Ambulatory Visit: Payer: Self-pay | Admitting: Family Medicine

## 2021-07-03 NOTE — Telephone Encounter (Signed)
May have 6 months on all ?

## 2021-07-06 NOTE — Telephone Encounter (Signed)
May have 6 months ?

## 2021-07-15 ENCOUNTER — Other Ambulatory Visit: Payer: Self-pay | Admitting: Family Medicine

## 2021-07-27 ENCOUNTER — Ambulatory Visit: Payer: Medicaid Other | Admitting: Family Medicine

## 2021-07-27 ENCOUNTER — Encounter: Payer: Self-pay | Admitting: Family Medicine

## 2021-07-27 ENCOUNTER — Ambulatory Visit (INDEPENDENT_AMBULATORY_CARE_PROVIDER_SITE_OTHER): Payer: Medicaid Other | Admitting: Family Medicine

## 2021-07-27 VITALS — BP 112/81 | Temp 98.4°F

## 2021-07-27 DIAGNOSIS — M25671 Stiffness of right ankle, not elsewhere classified: Secondary | ICD-10-CM

## 2021-07-27 DIAGNOSIS — L409 Psoriasis, unspecified: Secondary | ICD-10-CM | POA: Diagnosis not present

## 2021-07-27 DIAGNOSIS — R7301 Impaired fasting glucose: Secondary | ICD-10-CM | POA: Diagnosis not present

## 2021-07-27 DIAGNOSIS — L404 Guttate psoriasis: Secondary | ICD-10-CM | POA: Diagnosis not present

## 2021-07-27 DIAGNOSIS — R21 Rash and other nonspecific skin eruption: Secondary | ICD-10-CM

## 2021-07-27 DIAGNOSIS — J3489 Other specified disorders of nose and nasal sinuses: Secondary | ICD-10-CM

## 2021-07-27 DIAGNOSIS — M21371 Foot drop, right foot: Secondary | ICD-10-CM

## 2021-07-27 DIAGNOSIS — G6281 Critical illness polyneuropathy: Secondary | ICD-10-CM | POA: Insufficient documentation

## 2021-07-27 MED ORDER — GABAPENTIN 300 MG PO CAPS: ORAL_CAPSULE | ORAL | 4 refills | Status: DC

## 2021-07-27 NOTE — Progress Notes (Signed)
   Subjective:    Patient ID: Mariann Barter, female    DOB: 06-Nov-1980, 41 y.o.   MRN: [id number]  HPI Pt presents with rash on arms/hands and back area. Pt reports areas do hurt. Pt reports at first she had blisters then they burst.  Pt states she has recently been dealing with sinus infection.  Very nice patient Comes in with multiple issues She does relate a little bit head congestion drainage She also states that she has unusual rash on her hands that itches at times other times does not she denies any other particular troubles with it She also comes with multiple other issues see below Rash - Plan: C-reactive protein, ANA, Hemoglobin A1c, Ambulatory referral to Dermatology  Psoriasis - Plan: C-reactive protein, ANA, Hemoglobin A1c, Ambulatory referral to Dermatology  Guttate psoriasis - Plan: C-reactive protein, ANA, Hemoglobin A1c, Ambulatory referral to Dermatology  Malar rash - Plan: C-reactive protein, ANA, Hemoglobin A1c, Ambulatory referral to Dermatology  Fasting hyperglycemia - Plan: C-reactive protein, ANA, Hemoglobin A1c, Ambulatory referral to Endocrinology  Sinus pressure  Critical illness polyneuropathy ([place]) - Plan: C-reactive protein, ANA, Hemoglobin A1c  Ankle stiff, right - Plan: C-reactive protein, ANA, Hemoglobin A1c, Ambulatory referral to Dermatology, Ambulatory referral to Orthopedic Surgery  Right foot drop - Plan: Ambulatory referral to Orthopedic Surgery   Review of Systems     Objective:   Physical Exam  Gen-NAD not toxic TMS-normal bilateral T- normal no redness Chest-CTA respiratory rate normal no crackles CV RRR no murmur Skin-warm dry Neuro-grossly normal Does have what appears to be foot drop on the right side      Assessment & Plan:  1. Rash Significant rash on the hands and arms as well as psoriasis it is hard to tell if the rash on her hands is from guttate psoriasis or other issue will refer to dermatology - C-reactive  protein - ANA - Hemoglobin A1c - Ambulatory referral to Dermatology  2. Psoriasis Patient would benefit from biologic agent.  May continue to use clobetasol - C-reactive protein - ANA - Hemoglobin A1c - Ambulatory referral to Dermatology  3. Guttate psoriasis See above discussion - C-reactive protein - ANA - Hemoglobin A1c - Ambulatory referral to Dermatology  4. Malar rash Patient states intermittent malar rash therefore check ANA and C-reactive protein - C-reactive protein - ANA - Hemoglobin A1c - Ambulatory referral to Dermatology  5. Fasting hyperglycemia Has history of fasting hyperglycemia check A1c to rule out diabetes - C-reactive protein - ANA - Hemoglobin A1c - Ambulatory referral to Endocrinology  6. Sinus pressure I find no evidence of a sinus infection hold off on antibiotics  7. Critical illness polyneuropathy ([place]) Patient with significant polyneuropathy causes her pain and discomfort. - C-reactive protein - ANA - Hemoglobin A1c  8. Ankle stiff, right Foot drop on the right side would benefit from seeing orthopedics to have them fit her for a brace - C-reactive protein - ANA - Hemoglobin A1c - Ambulatory referral to Dermatology - Ambulatory referral to Orthopedic Surgery  9. Right foot drop See above - Ambulatory referral to Orthopedic Surgery Keep all regular follow-up visits by early August

## 2021-07-30 LAB — HEMOGLOBIN A1C
Est. average glucose Bld gHb Est-mCnc: 111 mg/dL
Hgb A1c MFr Bld: 5.5 % (ref 4.8–5.6)

## 2021-07-30 LAB — ANA: ANA Titer 1: NEGATIVE

## 2021-07-30 LAB — C-REACTIVE PROTEIN: CRP: 6 mg/L (ref 0–10)

## 2021-09-02 ENCOUNTER — Other Ambulatory Visit: Payer: Self-pay | Admitting: Internal Medicine

## 2021-09-02 ENCOUNTER — Other Ambulatory Visit: Payer: Self-pay | Admitting: Family Medicine

## 2021-09-02 NOTE — Telephone Encounter (Signed)
Lmom notifying pt

## 2021-09-02 NOTE — Telephone Encounter (Signed)
RX X 1. Needs ov for additional refills. Last seen 10/2019.

## 2021-09-03 NOTE — Progress Notes (Signed)
Primary Care Physician:  Kathyrn Drown, MD  Primary GI: Dr. Gala Romney  Patient Location: Home   Provider Location: [facility] office   Reason for Visit: Follow-up   Persons present on the virtual encounter, with roles: Aliene Altes, PA-C (Provider), Belinda [name] (patient)   Total time (minutes) spent on medical discussion: 45 minutes  Virtual Visit via video note Due to COVID-19, visit is conducted virtually and was requested by patient.   I connected with Jenny Reichmann on 09/04/21 at 10:30 AM EDT by video and verified that I am speaking with the correct person using two identifiers.   I discussed the limitations, risks, security and privacy concerns of performing an evaluation and management service by video and the availability of in person appointments. I also discussed with the patient that there may be a patient responsible charge related to this service. The patient expressed understanding and agreed to proceed.  Chief Complaint  Patient presents with   Follow-up    Patient here today for a follow on Gerd and Constipation. She is taking Dexlansoprazole 60 mg once per day,famotidine 40 mg prn. She takes Linzess 290 mcg daily. She reports she recently had her gallbladder removed at the end of April.     History of Present Illness: 41 year old female with GI history of GERD and constipation, presenting today for follow-up.  Last seen in our office 11/07/2019.  Constipation not adequately managed on Linzess 290 mcg daily.  Previously tried MiraLAX which worsened her constipation.  She was advised to add Colace 100 mg twice daily.  GERD was doing well on Dexilant and she was advised to continue this.  She underwent cholecystectomy and liver biopsy with Dr. Kristie Cowman on 06/10/2021.  Noted fatty liver changes and patient on numerous medications, and ultimately decided to pursue liver biopsy.  Pathology showed chronic cholecystitis and cholelithiasis, mild steatosis of the liver  without steatohepatitis.   She has had intermittent mild elevations of LFTs in the past.  Most recent labs 06/10/2021 with AST 71, ALT 85.   Today:   Constipation:  Taking Linzess 290 mcg daily, but this doesn't seem to be working as well. Was having good BMs after cholecystectomy, but now bowels will will move 1-2 days, then skip a week at a time. Stools are hard balls. No brbpr or melena. Amitiza caused sweating. Hasn't ever been on Trulance. Reports colace doesn't help. MiraLAX causes constipation.   GERD:  Taking Dexilant 60 mg daily and famotidine 40 mg in the afternoon. Sometimes will take an old Rx of Protonix as her symptoms will be so bad. Also taking tums after eating. Feels like Protonix works better than [employer]. She is have reflux symptoms daily, typically in the afternoon. Also has sour burps.  Ate lunch today, the last one time.  States she eats all of her calories for the day and 1 meal.  She eats what ever she wants such as fast food, chicken salad sandwich, chips, and always something sweet after her meal.  Typically, she is eating a ice cream Sunday every evening.  Also drinks Dr. Malachi Bonds throughout the day.  She is reporting dysphagia, but has trouble explaining her symptoms.  States she may eat about 10 bites, then feels like she cannot swallow anymore.  Feels like she has something pushing up in her esophagus, so she cannot swallow anything down.  She did have some similar symptoms previously, but reports it seemed to get better after last EGD though no  dilation was performed.  She started noticing these symptoms over the last few months.  Reports she had COVID at 29.  She was hospitalized and on life support, required trach.  She was walking with a walker after that.  Then, in December 2022, she had acute pancreatitis secondary to her gallbladder.  She has foot drop, having problems using her walker due to her abdominal pain.  She then went into a wheelchair and has had trouble  coming back out of her wheelchair.  She had her gallbladder removed in April and is not having any abdominal pain.   No alcohol.  No drug use.  No tylenol.  No OTC supplements.   Past Medical History:  Diagnosis Date   Asthma    Bipolar affective ([place])    Chronic back pain    Chronic cholecystitis    Chronic constipation    Chronic fatigue    post covid   Complication of anesthesia    per pt highly anxious due to PTSD and slow to wake   DOE (dyspnea on exertion)    post covid   Edema of both lower extremities    takes lasix   Family history of adverse reaction to anesthesia    son-- ponv   Fatty liver 11/14/2018   GAD (generalized anxiety disorder)    Gait abnormality    bilateral x4 extremity weakness and right foot drop;  uses wheelchair   GERD (gastroesophageal reflux disease)    Heart palpitations    History of abnormal cervical Pap smear    Mild cognitive disorder    Morbid obesity (HCC)    OCD (obsessive compulsive disorder)    Opioid use disorder    Panic disorder    w/ panic attacks   Peripheral neuropathy    Post-COVID syndrome 01/2020   hosiptal admission 02-06-2020 positive coivd / pneumonia/ acute respiratory failure w/ hypoxemia/  ARDS/  intubated;  prolonged stay, trachostomy 02-19-2020,  decanulated 03-16-2020 then went to rehab//  post discharge DOE/ fatigue/ weakness bilateral all extremities   Psoriasis    PTSD (post-traumatic stress disorder)    post coivd due to prolonged ICU admission   Reactive airways dysfunction syndrome (HCC)    Right foot drop    Urinary incontinence    Weakness of extremity    all four,  uses wheelchair,  only able to walk w/ walker 10-12fet   Wears dentures    upper   Wheelchair dependence      Past Surgical History:  Procedure Laterality Date   CESAREAN SECTION     x2  last one 09-26-2003  @[facility] ;   with partial panniculectomy   CHOLECYSTECTOMY N/A 06/10/2021   Procedure: LAPAROSCOPIC CHOLECYSTECTOMY WITH  INTRAOPERATIVE CHOLANGIOGRAM;  Surgeon: GMichael Boston MD;  [facility]  Service: General;  Laterality: N/A;  [place]     03/ 2010   ESOPHAGOGASTRODUODENOSCOPY (EGD) WITH PROPOFOL N/A 08/03/2018   Surgeon: RDaneil Dolin MD; normal exam.  Unable to advance FIsabell Jarvisdilator at the hypopharynx, so no dilation was performed.   LIVER BIOPSY N/A 06/10/2021   Procedure: NEEDLE CORE BIOPSY OF LIVER;  Surgeon: GMichael Boston MD;  [facility]  Service: General;  Laterality: N/A;   RADIOLOGY WITH ANESTHESIA N/A 11/04/2020   Procedure: MRI WITH ANESTHESIA BRAIN WITH AND WITHOUT,[facility] AND [facility],THORACIC WITH AND WITHOUT;  Surgeon: Radiologist, Medication, MD;  [facility] [facility]  Service: Radiology;  Laterality: N/A;   RADIOLOGY WITH ANESTHESIA N/A 02/17/2021   Procedure: MRI ABDOMEN WITH AND WITHOUT CONTRAST WITH ANESTHESIA;  Surgeon: Radiologist, Medication, MD;  Location: [place];  Service: Radiology;  Laterality: N/A;     Current Meds  Medication Sig   ALPRAZolam (XANAX) 1 MG tablet Take 1 tablet (1 mg total) by mouth 4 (four) times daily as needed for anxiety. (Patient taking differently: Take 1 mg by mouth 3 (three) times daily as needed for anxiety.)   buprenorphine (SUBUTEX) 8 MG SUBL SL tablet Place 16 mg under the tongue daily.   clobetasol (TEMOVATE) 0.05 % external solution APPLY EXTERNALLY TO THE AFFECTED AREA TWICE DAILY AS NEEDED.   famotidine (PEPCID) 40 MG tablet TAKE 1 TABLET ONCE DAILY.   FLOVENT HFA 110 MCG/ACT inhaler INHALE 1 PUFF TWICE A DAY FOR ASTHMA.   fluticasone (FLONASE) 50 MCG/ACT nasal spray USE (2) SPRAYS IN EACH NOSTRIL ONCE DAILY.   furosemide (LASIX) 20 MG tablet TAKE 1/2 TO 1 TABLET IN THE MORNING AS NEEDED FOR SWELLING IN LEGS. (Patient taking differently: Take 20 mg by mouth daily.)   gabapentin (NEURONTIN) 300 MG capsule Take 3  three times daily (Patient taking differently: Take 900 mg by mouth 3 (three) times daily.)   ibuprofen (ADVIL) 200 MG tablet Take 400 mg by mouth 3 (three) times daily.   ketoconazole (NIZORAL) 2 % cream Apply 1 application. topically 2 (two) times daily as needed (yeast). (Patient taking differently: Apply 1 application  topically 2 (two) times daily as needed for irritation (yeast).)   methocarbamol 1000 MG TABS Take 1,000 mg by mouth every 6 (six) hours as needed for muscle spasms.   metoprolol tartrate (LOPRESSOR) 25 MG tablet Take 1 tablet by mouth in the morning and 1 tablet by mouth in evening. (Patient taking differently: Take 25 mg by mouth 2 (two) times daily.)   naphazoline-pheniramine (ALLERGY EYE) 0.025-0.3 % ophthalmic solution Place 1-2 drops into both eyes 4 (four) times daily as needed for eye irritation.   ondansetron (ZOFRAN) 8 MG tablet Take 1 tablet (8 mg total) by mouth every 8 (eight) hours as needed for nausea.   pantoprazole (PROTONIX) 40 MG tablet Take 1 tablet (40 mg total) by mouth 2 (two) times daily before a meal.   Plecanatide (TRULANCE) 3 MG TABS Take 3 mg by mouth daily.   VENTOLIN HFA 108 (90 Base) MCG/ACT inhaler INHALE 2 PUFFS INTO THE LUNGS EVERY 6 HOURS AS NEEDED FOR WHEEZING. (Patient taking differently: 2 puffs every 6 (six) hours as needed for wheezing or shortness of breath.)   [DISCONTINUED] dexlansoprazole (DEXILANT) 60 MG capsule TAKE 1 CAPSULE BY MOUTH ONCE DAILY.   [DISCONTINUED] LINZESS 290 MCG CAPS capsule TAKE 1 CAPSULE BEFORE BREAKFAST. (Patient taking differently: Take 290 mcg by mouth daily before breakfast.)     Family History  Problem Relation Age of Onset   High blood pressure Father    Stroke Father    High blood pressure Brother    Hypertension Father    Heart attack Father    Heart disease Maternal Grandmother    Hypertension Brother    Breast cancer Maternal Aunt    Colon cancer Neg Hx     Social History   Socioeconomic History    Marital status: Legally Separated    Spouse name: Not on file   Number of children: 2   Years of education: some college   Highest education level: Not on file  Occupational History  Occupation: unemployed  Tobacco Use   Smoking status: Every Day    Packs/day: 0.50    Years: 15.00    Total pack years: 7.50    Types: Cigarettes   Smokeless tobacco: Never  Vaping Use   Vaping Use: Never used  Substance and Sexual Activity   Alcohol use: No   Drug use: No    Comment: opioid use disorder   Sexual activity: Yes    Birth control/protection: None  Other Topics Concern   Not on file  Social History Narrative   Lives with son (boyfriend and mother supervise).   Left-handed.   Rare caffeine use.   Social Determinants of Health   Financial Resource Strain: Not on file  Food Insecurity: Not on file  Transportation Needs: Not on file  Physical Activity: Not on file  Stress: Not on file  Social Connections: Not on file       Review of Systems: Gen: Denies fever, chills, cold or flulike symptoms, presyncope, syncope. CV: Denies chest pain, palpitations. Resp: Denies dyspnea, cough.  GI: see HPI Derm: Denies rash. Psych: Denies depression, anxiety. Heme: See HPI  Observations/Objective: No distress. Alert and oriented. Pleasant. Well nourished. Normal mood and affect.  In a wheelchair.  Unable to perform complete physical exam due to video encounter.    Assessment:  41 year old female with history of bipolar affective disorder, anxiety, OCD, panic disorder, psoriasis, asthma, GERD, constipation, fatty liver, presenting today for follow-up.  GERD: Chronic.  Not adequately managed on Dexilant 60 mg daily and famotidine 40 mg daily.  Also taking Tums daily and occasionally an old Protonix prescription which she feels works better for her. Query whether patient is losing response to [place], though also like her diet/lifestyle/weight is likely the primary contributing  factor to her daily breakthrough symptoms.  We will try changing Dexilant to Protonix 40 mg twice daily.  Counseled extensively on GERD diet/lifestyle.  Also referring to healthy weight and wellness for weight management.  Dysphagia: Patient reports few month history of dysphagia though she has a difficult time describing her symptoms.  Reports after about 10 bites of eating, she feels she can no longer swallow anything as it feels like there is something pushing back up in her esophagus.  Prior EGD in 2020 with normal exam.  No dilation performed at that time as Dr. Gala Romney was unable to start advancement of [facility] dilator at hypopharynx; however, patient does report some improvement in dysphagia after that exam until recently.  Symptoms may be secondary to uncontrolled GERD, but she may have also developed an esophageal web, ring, stricture. May also have EOE. Less likely malignancy.  We will proceed with an EGD with possible dilation for further evaluation and therapeutic intervention.  Constipation: Chronic.  Not adequately managed with Linzess 290 mcg daily.  No alarm symptoms.  Reports adding Colace previously not helpful and MiraLAX worsens her constipation.  She has tried Amitiza in the past which she reports caused sweating.  She has never tried any other medications.  We will try switching her to [facility] for now.  May need to consider Movantik if Trulance is not helpful as she likely has a component of opioid-induced constipation in the setting of Subutex.  Also, decreased mobility contributing as she is primarily in a wheelchair.  Fatty liver/elevated LFTs:  Mild intermittent LFT elevation in the past, most recent labs on 06/10/2021 with AST 71, ALT 85.  Notably, LFTs were within normal limits on 02/18/2021.  She underwent  cholecystectomy on 06/10/2021 and also had liver biopsy at the same time which showed mild steatosis without steatohepatitis.  She denies alcohol use, OTC supplements/herbal  teas, history of illicit drug use, or Tylenol use.  Acute hepatitis panel negative in January 2022.   Etiology of elevated LFTs likely secondary to fatty liver.  We discussed fatty liver extensively today including treatment with weight loss through diet and exercise as she is able.  Also discussed clinical course of fatty liver with potential progression to fibrosis and cirrhosis.  We will recheck liver enzymes at this time and also refer her to healthy weight and wellness to help with weight management.  BMI currently 40.24.   Plan: Proceed with upper endoscopy with propofol by Dr. Gala Romney in near future. The risks, benefits, and alternatives have been discussed with the patient in detail. The patient states understanding and desires to proceed.  ASA 3 Stop Dexilant and start Protonix 40 mg twice daily. Take famotidine 40 mg at bedtime. Counseled extensively on GERD diet/lifestyle.  Separate written instructions provided. Stop Linzess and start Trulance 3 mg daily. Counseled on fatty liver including treatment and clinical course.  Separate written instructions provided. Referred to healthy weight and wellness for weight management. Follow-up in 3 months via virtual visit per patient's request.     I discussed the assessment and treatment plan with the patient. The patient was provided an opportunity to ask questions and all were answered. The patient agreed with the plan and demonstrated an understanding of the instructions.   The patient was advised to call back or seek an in-person evaluation if the symptoms worsen or if the condition fails to improve as anticipated.  I provided 45 minutes of video-face-to-face time during this encounter.  Aliene Altes, PA-C [facility]  09/04/2021

## 2021-09-04 ENCOUNTER — Telehealth: Payer: Self-pay | Admitting: Gastroenterology

## 2021-09-04 ENCOUNTER — Telehealth (INDEPENDENT_AMBULATORY_CARE_PROVIDER_SITE_OTHER): Payer: Medicaid Other | Admitting: Gastroenterology

## 2021-09-04 ENCOUNTER — Encounter: Payer: Self-pay | Admitting: Gastroenterology

## 2021-09-04 VITALS — Ht 62.0 in | Wt 220.0 lb

## 2021-09-04 DIAGNOSIS — K219 Gastro-esophageal reflux disease without esophagitis: Secondary | ICD-10-CM | POA: Diagnosis not present

## 2021-09-04 DIAGNOSIS — K760 Fatty (change of) liver, not elsewhere classified: Secondary | ICD-10-CM | POA: Diagnosis not present

## 2021-09-04 DIAGNOSIS — R1310 Dysphagia, unspecified: Secondary | ICD-10-CM

## 2021-09-04 DIAGNOSIS — K5900 Constipation, unspecified: Secondary | ICD-10-CM | POA: Diagnosis not present

## 2021-09-04 DIAGNOSIS — R7989 Other specified abnormal findings of blood chemistry: Secondary | ICD-10-CM | POA: Insufficient documentation

## 2021-09-04 DIAGNOSIS — Z6841 Body Mass Index (BMI) 40.0 and over, adult: Secondary | ICD-10-CM

## 2021-09-04 MED ORDER — PANTOPRAZOLE SODIUM 40 MG PO TBEC: 40.0000 mg | DELAYED_RELEASE_TABLET | Freq: Two times a day (BID) | ORAL | 3 refills | Status: DC

## 2021-09-04 MED ORDER — TRULANCE 3 MG PO TABS: 3.0000 mg | ORAL_TABLET | Freq: Every day | ORAL | 3 refills | Status: DC

## 2021-09-04 NOTE — Telephone Encounter (Addendum)
Mindy:  Please schedule EGD +/- dilation with propofol with Dr. Gala Romney.  Dx: GERD, dysphagia ASA 3  Please also place referral to healthy weight and wellness. Dx: Fatty liver, obesity.   Manuela Schwartz: Patient will need follow-up after her procedure. We can go ahead and plan for a 3 month follow-up for now. She prefers virtual. Please schedule on a Friday with me.

## 2021-09-04 NOTE — Patient Instructions (Addendum)
We will arrange for you have an upper endoscopy with possible dilation of your esophagus with Dr. Gala Romney.   Stop Linzess and start Trulance 3 mg daily.   Stop Dexilant and start Pantoprazole 40 mg twice daily 30 minutes before breakfast and dinner.   Take Famotidine 40 mg at bedtime.   Follow a GERD diet:  Avoid fried, fatty, greasy, spicy, citrus foods. Avoid caffeine and carbonated beverages. Avoid chocolate. Try eating 4-6 small meals a day rather than 3 large meals. Do not eat within 3 hours of laying down. Prop head of bed up on wood or bricks to create a 6 inch incline.  Instructions for fatty liver: Recommend 1-2# weight loss per week until ideal body weight through exercise & diet. Low fat/cholesterol diet.   Avoid sweets, sodas, fruit juices, sweetened beverages like tea, etc. Continue to avoid alcohol use.  We will refer you to healthy weight and wellness to help with weight loss.   We will see you back after your procedures.   It was nice meeting you today!   Aliene Altes, PA-C [facility]

## 2021-09-07 NOTE — Addendum Note (Signed)
Addended by: Cheron Every on: 09/07/2021 09:07 AM   Modules accepted: Orders

## 2021-09-07 NOTE — Telephone Encounter (Signed)
LMOVM to call back

## 2021-09-07 NOTE — Telephone Encounter (Signed)
Added to recall for a 3 month follow up after procedure as a Virtual on a Friday

## 2021-09-08 NOTE — Telephone Encounter (Signed)
LMOVM for pt. Mychart message also sent to call

## 2021-09-09 ENCOUNTER — Telehealth: Payer: Self-pay | Admitting: *Deleted

## 2021-09-09 NOTE — Telephone Encounter (Signed)
Noted  

## 2021-09-09 NOTE — Telephone Encounter (Signed)
Received letter approving Trulance 20m. LMOM for pt to call office back.

## 2021-09-10 ENCOUNTER — Telehealth: Payer: Self-pay | Admitting: Gastroenterology

## 2021-09-10 NOTE — Telephone Encounter (Signed)
Belinda [name], please remind patient about having labs completed at [facility] to recheck LFTs.

## 2021-09-10 NOTE — Telephone Encounter (Signed)
Left pt a detailed message to have labs completed at [facility].

## 2021-09-11 ENCOUNTER — Telehealth: Payer: Self-pay | Admitting: *Deleted

## 2021-09-11 NOTE — Telephone Encounter (Signed)
Routing to [place] who has been trying to reach patient.   Mindy, looks like in the message you sent patient requesting her to call back, our number was typed incorrectly. If you can't reach her by phone, may be able to send an updated patient message.

## 2021-09-11 NOTE — Telephone Encounter (Signed)
Spoke to pt informed her that prescription was ready at [facility] and to have labs completed. Pt voiced understanding. Pt inquiring about EGD.

## 2021-09-11 NOTE — Telephone Encounter (Signed)
Spoke with pt. She needed dates to speak with spouse to see what she can do. Mychart sent

## 2021-09-20 ENCOUNTER — Other Ambulatory Visit: Payer: Self-pay | Admitting: Family Medicine

## 2021-09-20 ENCOUNTER — Encounter: Payer: Self-pay | Admitting: Family Medicine

## 2021-09-20 ENCOUNTER — Telehealth: Payer: Self-pay | Admitting: Family Medicine

## 2021-09-20 NOTE — Telephone Encounter (Signed)
Front  I received notification regarding Nethra Mehlberg.  [place] [facility] with Medicaid has placed her in lock in controlled medicine panel.  When we prescribed her alprazolam we will include the NPI number on her prescription.  I dictated a letter to the lock-in program letting them know that we will not currently or in the future prescribe opioid pain medicines for this patient and therefore any prescriptions for opioid would need to come through a separate physician not's  Front Please mail the dictated letter along with a copy of the [place] tracks operation letter to their [address] in [facility]  [address] [place], [place] [address]

## 2021-09-20 NOTE — Progress Notes (Signed)
Please see form from [place] [facility]

## 2021-09-21 ENCOUNTER — Telehealth: Payer: Self-pay | Admitting: Family Medicine

## 2021-09-21 NOTE — Telephone Encounter (Signed)
Patient is requesting a referral to help her find a doctor who will prescribe her medications . She states tried two places [facility] and a place on [address] but wouldn't take her because of the medications and current condition she is in she stated. After 8/25 she wont be able to get medications. Please advise

## 2021-09-22 ENCOUNTER — Other Ambulatory Visit: Payer: Self-pay | Admitting: Family Medicine

## 2021-09-22 NOTE — Telephone Encounter (Signed)
Left message to return call 

## 2021-09-22 NOTE — Telephone Encounter (Signed)
The treatment center [address] that she gets her daily Suboxone from is closing doors 10/16/21 and they gave them a list of other clinics to call- [facility] will not take her because of her meds and [facility] will not take her because of the xanax and lots of pp;aces will not take because of xanax but she is not willing to come off xanax- said she has to have it and cant live without it and also takes suboxone 3 times a day and cant stop it cold [place] and needs it and can't be without it. Patient states the places that will take her are in [place] or even farther away and she cant ne going 35-40 miles all the time for her medicine and wants to know how she can gets this med locally Patient states even the dr says she has to have xanax-  the closest place that agreed to take her for suboxone was 30 minutes away and she guesses she will have to go there if a closer place can not be found.

## 2021-09-22 NOTE — Telephone Encounter (Signed)
Unfortunately she is in a predicament for which there may not be a good solution We will discuss this with her further when she follows up on the seventh I highly recommend that she go ahead and establish herself with the place willing to prescribe to her while she is on her Xanax. Once again we will discuss this in further detail when she follows up

## 2021-09-22 NOTE — Telephone Encounter (Signed)
Patient stated she was able to get an appt to establish with Dr Royce Macadamia at [facility] on 10/09/21 at 3pm

## 2021-09-22 NOTE — Telephone Encounter (Signed)
Thank goodness We will still be discussing reducing her Xanax when she follows up

## 2021-09-22 NOTE — Telephone Encounter (Signed)
Nurses I need more details Please clarify with patient When she states she cannot find a doctor to prescribe the medicines which medicine specifically is she referring to (Just the nerve pills such as Xanax?  Or is this in regards to narcotics or substance abuse treatments such as Suboxone) Also please other places that will not accept her did they indicate to her why?

## 2021-09-28 ENCOUNTER — Ambulatory Visit (INDEPENDENT_AMBULATORY_CARE_PROVIDER_SITE_OTHER): Payer: Medicaid Other | Admitting: Family Medicine

## 2021-09-28 DIAGNOSIS — G4730 Sleep apnea, unspecified: Secondary | ICD-10-CM | POA: Diagnosis not present

## 2021-09-28 DIAGNOSIS — F411 Generalized anxiety disorder: Secondary | ICD-10-CM | POA: Diagnosis not present

## 2021-09-28 DIAGNOSIS — G6281 Critical illness polyneuropathy: Secondary | ICD-10-CM

## 2021-09-28 DIAGNOSIS — R0609 Other forms of dyspnea: Secondary | ICD-10-CM

## 2021-09-28 DIAGNOSIS — R600 Localized edema: Secondary | ICD-10-CM | POA: Diagnosis not present

## 2021-09-28 DIAGNOSIS — J4530 Mild persistent asthma, uncomplicated: Secondary | ICD-10-CM

## 2021-09-28 DIAGNOSIS — L409 Psoriasis, unspecified: Secondary | ICD-10-CM

## 2021-09-28 MED ORDER — ALPRAZOLAM 1 MG PO TABS: 1.0000 mg | ORAL_TABLET | Freq: Four times a day (QID) | ORAL | 2 refills | Status: DC | PRN

## 2021-09-28 NOTE — Progress Notes (Signed)
   Subjective:    Patient ID: Belinda [name], female    DOB: 09/05/1980, 41 y.o.   MRN: [id number]  HPI This patient has a longstanding history of anxiety and depression depression has been doing well but she has this ongoing trouble with anxiety.  She gets panicky whenever she thinks about cutting back on her medicines.  She states she has been under tremendous stress.  She does talk with a counselor on a regular basis.  We did discuss reducing her Xanax further but she literally panics about this stating that she needs the 3 a day and she typically has at least 1 or 2 panic attacks a week for which she has to take an extra tablet so therefore the 98 works out best for her.  We did talk about seeing a psychiatrist she has tried serotonin reuptake inhibitors as well as other medicines did not tolerate these.  She has been on Xanax for greater than 20 years according to her.  She also has opioid disuse/substance abuse disorder.  On Suboxone.  States the combination of this does not make her drowsy.  Very challenging situation for the patient as well as clinician   Review of Systems     Objective:   Physical Exam  General-in no acute distress Eyes-no discharge Lungs-respiratory rate normal, CTA CV-no murmurs,RRR Extremities skin warm dry no edema Neuro grossly normal Behavior normal, alert       Assessment & Plan:  She does have neuropathy of the lower leg 1. Generalized anxiety disorder We will continue the alprazolam as is.  The medication needs to last a month at a time she will follow-up in 3 months to discuss further continue her counseling - ALPRAZolam (XANAX) 1 MG tablet; Take 1 tablet (1 mg total) by mouth 4 (four) times daily as needed for anxiety.  Dispense: 98 tablet; Refill: 2  2. Critical illness polyneuropathy ([place]) Patient comes in today in a wheelchair states because of the weakness in her legs she cannot walk.  She has been disabled ever since her hospitalization for  severe COVID  3. Pedal edema Suffers with pedal edema associated with her physical status  4. Sleep apnea, unspecified type Significant sleep apnea issues with snoring at nighttime and intermittent poor sleep I recommend a sleep study - Ambulatory referral to Sleep Studies  5. Psoriasis She has history of psoriasis being treated with a topical  Patient needs a follow-up for her pulmonary consult.  Because of dyspnea as well as reactive airway we will go ahead and give her a referral to the pulmonologist  Follow-up in 3 months

## 2021-09-28 NOTE — Progress Notes (Signed)
   Subjective:    Patient ID: Belinda [name], female    DOB: 06-28-1980, 41 y.o.   MRN: [id number]  HPI  3 month follow up   Review of Systems     Objective:   Physical Exam        Assessment & Plan:

## 2021-09-29 LAB — HEPATIC FUNCTION PANEL
ALT: 34 IU/L — ABNORMAL HIGH (ref 0–32)
AST: 27 IU/L (ref 0–40)
Albumin: 4.2 g/dL (ref 3.9–4.9)
Alkaline Phosphatase: 84 IU/L (ref 44–121)
Bilirubin Total: 0.4 mg/dL (ref 0.0–1.2)
Bilirubin, Direct: 0.12 mg/dL (ref 0.00–0.40)
Total Protein: 6.8 g/dL (ref 6.0–8.5)

## 2021-10-01 ENCOUNTER — Telehealth: Payer: Self-pay

## 2021-10-01 ENCOUNTER — Other Ambulatory Visit: Payer: Self-pay | Admitting: Family Medicine

## 2021-10-01 ENCOUNTER — Other Ambulatory Visit: Payer: Self-pay | Admitting: Gastroenterology

## 2021-10-01 DIAGNOSIS — K5904 Chronic idiopathic constipation: Secondary | ICD-10-CM

## 2021-10-01 MED ORDER — LINACLOTIDE 290 MCG PO CAPS: 290.0000 ug | ORAL_CAPSULE | Freq: Every day | ORAL | 3 refills | Status: DC

## 2021-10-01 NOTE — Telephone Encounter (Signed)
Pt called stating that the Linzess is working and would like for a prescription to be called in. Patient also stated that she has been taking Dexilant in the morning an pantoprazole at night. I instructed patient to not do that to either take the dexilant once daily as prescribed or take the pantoprazole twice daily as prescribed. Pt states that she was trying to finish taking what she had left of the dexilant and figured that it wouldn't hurt to take both since it was 12 hours apart. Pt states that she took dexilant this morning but would not take the pantoprazole at night like she has been doing.

## 2021-10-01 NOTE — Telephone Encounter (Signed)
She stopped the trulance stating that the linzess works better, but will hold on to trulance incase linzess stops working.

## 2021-10-01 NOTE — Telephone Encounter (Signed)
Is she taking Linzess or Trulance? At her last visit, I recommended stopping Linzess 290 mcg and trying Trulance 3 mg daily, and a Rx for Trulance was sent to her pharmacy.   I agree with recommendations given on PPI. She can complete her Dexilant Rx, then start Protonix BID if that is what she prefers.

## 2021-10-01 NOTE — Telephone Encounter (Signed)
Pt was made aware.  

## 2021-10-01 NOTE — Telephone Encounter (Signed)
Noted  

## 2021-10-01 NOTE — Telephone Encounter (Signed)
Noted. She can continue Linzess as she feels it is working well. If trouble in the future, she can let us know. I am sending in a refill for her.

## 2021-10-02 ENCOUNTER — Telehealth: Payer: Self-pay | Admitting: Family Medicine

## 2021-10-02 NOTE — Telephone Encounter (Signed)
Patient was seen 8/7 and stated she spoke with you about being fatigue a lot . She remember that had been on her menstrual cycle for two weeks and she has them every six week. Once she comes off her cycle she feeling better.She also wanted to know if she needed to still have sleep study.

## 2021-10-05 NOTE — Telephone Encounter (Signed)
I still highly recommend a sleep study In addition to this I would recommend a CBC because of her menstrual cycles to make sure she is not becoming anemic thank you Diagnosis menorrhagia fatigue

## 2021-10-06 ENCOUNTER — Other Ambulatory Visit: Payer: Self-pay

## 2021-10-06 DIAGNOSIS — N921 Excessive and frequent menstruation with irregular cycle: Secondary | ICD-10-CM

## 2021-10-06 DIAGNOSIS — F411 Generalized anxiety disorder: Secondary | ICD-10-CM

## 2021-10-06 DIAGNOSIS — N920 Excessive and frequent menstruation with regular cycle: Secondary | ICD-10-CM

## 2021-10-06 NOTE — Telephone Encounter (Signed)
Patient has been advised per drs orders and recommendations.

## 2021-10-12 ENCOUNTER — Telehealth: Payer: Self-pay

## 2021-10-12 DIAGNOSIS — R0989 Other specified symptoms and signs involving the circulatory and respiratory systems: Secondary | ICD-10-CM

## 2021-10-12 NOTE — Telephone Encounter (Signed)
Nurses Please touch base with Belinda [name] If she can have Dr. Royce Macadamia send [place] a copy of her blood work that would work fine  Also please touch base with Belinda [name] regarding her message regarding her Xanax as well as her sleep study?  Thank you

## 2021-10-12 NOTE — Telephone Encounter (Addendum)
Patient was just wanting to let you know that Dr Royce Macadamia is fine with her staying on her current dose of xanax and #98 a month and she does not want to decrease her xanax and she has been contacted for a consult for sleep study   She will have blood work sent to office at her follow up 8/31

## 2021-10-12 NOTE — Telephone Encounter (Signed)
Caller name:Belinda [name]   On DPR? :Yes  Call back number:[phone]  Provider they see: Luking   Reason for call:Dr Royce Macadamia was able to handle all medication and Belinda [name] was pleased. Leaving the clinic Dr Royce Macadamia is able to handle medications. Dr Royce Macadamia is ok with the  ALPRAZolam Duanne Moron) 1 MG tablet amount   Call about the sleep study she got a date consultation she wanted to let Dr Wolfgang Phoenix know that.  Dr Nicki Reaper wanted Belinda [name] to have blood work done for fatigue but Dr Royce Macadamia did blood work she is wanting to know if he can use that blood work if not let her know and she go get the blood work done Dr Nicki Reaper order

## 2021-10-12 NOTE — Telephone Encounter (Signed)
Patient wants a second opinion on ENT for chronic sinus issues - is not happy with current ENT as she is being told everything looks goods and it is allergies and she needs allergy testing. Patient does not want allergy referral thru current ENT because it is in [place]- wants allergy appt in [place] for allergy testing and new ENt in [place]

## 2021-10-12 NOTE — Telephone Encounter (Signed)
As is often the case 1 message becomes multiple messages It would be helpful if the patient sent a MyChart message regarding her referral request and the reasons why for the referral request  It is also quite possible patient may well need to do an office visit in order to initiate please request

## 2021-10-12 NOTE — Telephone Encounter (Signed)
Sure-that would be fine-please go ahead with both referrals

## 2021-10-12 NOTE — Telephone Encounter (Addendum)
Patient also wants to know id she can get a new ENT referral for a second opinion- she is not happy with current ENT and patient would also like a referral for allergy testing in [place]

## 2021-10-13 NOTE — Telephone Encounter (Signed)
Referrals placed in Epic and my chart message sent to patient.

## 2021-10-13 NOTE — Addendum Note (Signed)
Addended by: Vicente Males on: 10/13/2021 10:58 AM   Modules accepted: Orders

## 2021-10-30 ENCOUNTER — Other Ambulatory Visit: Payer: Self-pay | Admitting: Family Medicine

## 2021-10-30 ENCOUNTER — Encounter: Payer: Self-pay | Admitting: Pulmonary Disease

## 2021-10-30 ENCOUNTER — Ambulatory Visit: Payer: Medicaid Other

## 2021-10-30 ENCOUNTER — Encounter: Payer: Self-pay | Admitting: *Deleted

## 2021-10-30 ENCOUNTER — Ambulatory Visit (INDEPENDENT_AMBULATORY_CARE_PROVIDER_SITE_OTHER): Payer: Medicaid Other | Admitting: Pulmonary Disease

## 2021-10-30 VITALS — BP 140/70 | HR 101 | Temp 98.4°F | Ht 62.0 in | Wt 230.0 lb

## 2021-10-30 DIAGNOSIS — R0602 Shortness of breath: Secondary | ICD-10-CM

## 2021-10-30 DIAGNOSIS — M62838 Other muscle spasm: Secondary | ICD-10-CM | POA: Diagnosis not present

## 2021-10-30 MED ORDER — METHOCARBAMOL 1000 MG PO TABS: 1000.0000 mg | ORAL_TABLET | Freq: Four times a day (QID) | ORAL | 0 refills | Status: DC | PRN

## 2021-10-30 NOTE — Patient Instructions (Signed)
We will get a chest x-ray today Follow-up in 6 months

## 2021-10-30 NOTE — Progress Notes (Signed)
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  Admitted on 02/06/2020 with severe hypoxic respiratory failure, ARDS secondary to COVID-19.  She was intubated and admitted to the ICU.  She had a prolonged hospital stay requiring tracheostomy on 02/19/2020.  Eventually went to rehab and decannulated on 03/16/2020 and sent to rehab  Post discharge she has persistent symptoms of dyspnea on exertion, fatigue and long-haul COVID syndrome.  She is also dealing with significant PTSD after ICU stay which has exacerbated her baseline anxiety.  She follows with psychiatry for this  She developed flu infection around Thanksgiving of 2022.  A subsequent MRI abdomen on 02/17/2021 showed lung inflammation and she has been referred here for further evaluation  She was seen by Dr. West Carbo, ENT in January 2023 for chronic maxillary sinusitis and prescribed Augmentin Seen by Dr. Johney Maine in January 2023 for chronic cholecystitis and may need a cholecystectomy.  She will need pulmonary clearance before surgery  History notable for psoriasis and she reports worsening skin changes over the past few months  Pets: No pets Occupation: Currently on disability Exposures: No mold, hot tub, Jacuzzi.  No feather pillows or comforter Smoking history: She started smoking half pack per day since December 2022 Travel history: No significant travel history Relevant family history: No feather pillows or comforters  Interim history: Dyspnea stable She underwent cholecystectomy in April 2023 without any issues She is being followed by GI in [place] for GERD and plan for esophageal dilatation in the near future Sleep study has been ordered by  primary care  Complains of tenderness over the right chest anteriorly which occurs occasionally with no significant aggravating or relieving factors. She is following with Dr. Thamas Jaegers, orthopedics for right foot drop with ankle contracture  Outpatient Encounter Medications as of 10/30/2021  Medication Sig   ALPRAZolam (XANAX) 1 MG tablet Take 1 tablet (1 mg total) by mouth 4 (four) times daily as needed for anxiety.   buprenorphine (SUBUTEX) 8 MG SUBL SL tablet Place 16 mg under the tongue daily.   clobetasol (TEMOVATE) 0.05 % external solution APPLY EXTERNALLY TO THE AFFECTED AREA TWICE DAILY AS NEEDED.   famotidine (PEPCID) 40 MG tablet TAKE 1 TABLET ONCE DAILY.   FLOVENT HFA 110 MCG/ACT inhaler INHALE 1 PUFF TWICE A DAY FOR ASTHMA.   fluticasone (FLONASE) 50 MCG/ACT nasal spray USE (2) SPRAYS IN EACH NOSTRIL ONCE DAILY.   furosemide (LASIX) 20 MG tablet TAKE 1/2 TO 1 TABLET IN THE MORNING AS NEEDED FOR SWELLING IN LEGS. (Patient taking differently: Take 20 mg by mouth daily.)   gabapentin (NEURONTIN) 300 MG capsule Take 3 three times daily (Patient taking differently: Take 900 mg by mouth 3 (three) times daily.)   ibuprofen (ADVIL) 200 MG tablet Take 400 mg by mouth 3 (three) times daily.   ketoconazole (NIZORAL) 2 % cream Apply 1 application. topically 2 (two) times daily as needed (yeast). (Patient taking differently: Apply 1 application  topically 2 (two) times daily as needed for irritation (yeast).)   linaclotide (LINZESS) 290 MCG CAPS capsule Take 1 capsule (290 mcg total) by mouth daily before breakfast.   methocarbamol 1000 MG TABS Take 1,000 mg by mouth  every 6 (six) hours as needed for muscle spasms.   metoprolol tartrate (LOPRESSOR) 25 MG tablet Take 1 tablet by mouth in the morning and 1 tablet by mouth in evening. (Patient taking differently: Take 25 mg by mouth 2 (two) times daily.)   naphazoline-pheniramine (ALLERGY EYE) 0.025-0.3 % ophthalmic solution Place 1-2 drops into  both eyes 4 (four) times daily as needed for eye irritation.   ondansetron (ZOFRAN) 8 MG tablet Take 1 tablet (8 mg total) by mouth every 8 (eight) hours as needed for nausea.   pantoprazole (PROTONIX) 40 MG tablet Take 1 tablet (40 mg total) by mouth 2 (two) times daily before a meal.   VENTOLIN HFA 108 (90 Base) MCG/ACT inhaler INHALE 2 PUFFS INTO THE LUNGS EVERY 6 HOURS AS NEEDED FOR WHEEZING. (Patient taking differently: 2 puffs every 6 (six) hours as needed for wheezing or shortness of breath.)   No facility-administered encounter medications on file as of 10/30/2021.   Physical Exam: Blood pressure (!) 140/70, pulse (!) 101, temperature 98.4 F (36.9 C), temperature source Oral, height 5' 2"  (1.575 m), weight 230 lb (104.3 kg), SpO2 97 %. Gen:      No acute distress HEENT:  EOMI, sclera anicteric Neck:     No masses; no thyromegaly Lungs:    Clear to auscultation bilaterally; normal respiratory effort CV:         Regular rate and rhythm; no murmurs Abd:      + bowel sounds; soft, non-tender; no palpable masses, no distension Ext:    No edema; adequate peripheral perfusion Skin:      Warm and dry; no rash Neuro: alert and oriented x 3 Psych: normal mood and affect   Data Reviewed: Imaging: CTA 05/06/2020-no pulmonary embolism, dense consolidation in the left  Left lower lobe, streaky opacities in the lingula and lower lobes.  I have reviewed the images personally.  MRI abdomen 02/17/2021-mild edema concerning for acute pancreatitis, bilateral pulmonary interstitial and airspace disease, gallstones.  High-resolution CT 04/07/2021-linear scarring in the upper lobes.  Mild air trapping.  No clear evidence of interstitial lung disease. I have reviewed the images personally.  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  I suspect this was an acute process as she had flu infection just prior to that.  Her follow-up high-res CT does not show any significant interstitial lung disease.  She has mild linear scarring in the upper lobes which do not appear significant  PFTs reviewed with severe restriction.  However her FVC and diffusion capacity is normal.  I suspect the restriction is secondary to body habitus and obesity as ERV is reduced as well.  She continues to have persistent symptoms of dyspnea, fatigue, brain fog.  Continue supportive care  Atypical chest pain She has tenderness to palpation and is likely musculoskeletal Advised over-the-counter pain medication.  Get chest x-ray today She is asking for one-time refill of methocarbamol for muscle contracture.  She will get further refills from her primary care.  Psoriasis, skin rash Dermatology evaluation is pending  Plan/Recommendations: Chest x-ray Advised weight loss, exercise as possible Follow-up in 6 months  Marshell Garfinkel MD  Pulmonary and Critical Care 10/30/2021, 4:03 PM  CC: Kathyrn Drown, MD

## 2021-11-11 ENCOUNTER — Encounter: Payer: Self-pay | Admitting: *Deleted

## 2021-11-12 ENCOUNTER — Institutional Professional Consult (permissible substitution): Payer: Medicaid Other | Admitting: Neurology

## 2021-11-20 ENCOUNTER — Other Ambulatory Visit: Payer: Self-pay | Admitting: Family Medicine

## 2021-11-28 ENCOUNTER — Other Ambulatory Visit: Payer: Self-pay | Admitting: Pulmonary Disease

## 2021-11-28 ENCOUNTER — Other Ambulatory Visit: Payer: Self-pay | Admitting: Family Medicine

## 2021-11-30 ENCOUNTER — Other Ambulatory Visit: Payer: Self-pay | Admitting: Family Medicine

## 2021-12-02 ENCOUNTER — Ambulatory Visit: Payer: Medicaid Other | Admitting: Family Medicine

## 2021-12-09 ENCOUNTER — Institutional Professional Consult (permissible substitution): Payer: Medicaid Other | Admitting: Neurology

## 2021-12-10 ENCOUNTER — Other Ambulatory Visit: Payer: Self-pay | Admitting: Family Medicine

## 2021-12-11 ENCOUNTER — Other Ambulatory Visit: Payer: Self-pay | Admitting: Family Medicine

## 2021-12-22 ENCOUNTER — Ambulatory Visit (INDEPENDENT_AMBULATORY_CARE_PROVIDER_SITE_OTHER): Payer: Medicaid Other | Admitting: Family Medicine

## 2021-12-22 VITALS — BP 117/83 | HR 78 | Temp 98.6°F | Ht 62.0 in | Wt 245.0 lb

## 2021-12-22 DIAGNOSIS — R600 Localized edema: Secondary | ICD-10-CM | POA: Diagnosis not present

## 2021-12-22 DIAGNOSIS — M797 Fibromyalgia: Secondary | ICD-10-CM

## 2021-12-22 DIAGNOSIS — U099 Post covid-19 condition, unspecified: Secondary | ICD-10-CM

## 2021-12-22 DIAGNOSIS — F411 Generalized anxiety disorder: Secondary | ICD-10-CM

## 2021-12-22 DIAGNOSIS — L409 Psoriasis, unspecified: Secondary | ICD-10-CM | POA: Diagnosis not present

## 2021-12-22 DIAGNOSIS — Z23 Encounter for immunization: Secondary | ICD-10-CM

## 2021-12-22 MED ORDER — ALPRAZOLAM 1 MG PO TABS: 1.0000 mg | ORAL_TABLET | Freq: Four times a day (QID) | ORAL | 2 refills | Status: DC | PRN

## 2021-12-22 MED ORDER — METOPROLOL TARTRATE 25 MG PO TABS: ORAL_TABLET | ORAL | 5 refills | Status: DC

## 2021-12-22 MED ORDER — DULOXETINE HCL 30 MG PO CPEP: 30.0000 mg | ORAL_CAPSULE | Freq: Every day | ORAL | 5 refills | Status: DC

## 2021-12-22 MED ORDER — POTASSIUM CHLORIDE CRYS ER 10 MEQ PO TBCR: 10.0000 meq | EXTENDED_RELEASE_TABLET | Freq: Two times a day (BID) | ORAL | 5 refills | Status: DC

## 2021-12-22 MED ORDER — FUROSEMIDE 20 MG PO TABS: ORAL_TABLET | ORAL | 5 refills | Status: DC

## 2021-12-22 NOTE — Progress Notes (Signed)
   Subjective:    Patient ID: Belinda [name], female    DOB: Feb 02, 1981, 41 y.o.   MRN: [id number]  HPI Very nice patient Has a lot of difficult issues that she is facing Recently got her disability Has post-COVID syndrome Chronic anxiety issues History of opioid dependence Recent back troubles She was placed on methocarbamol for back spasms and leg pain  Leg swelling and pain all over for possible fibromyalgia Possible refill for methocarbamol for back spasm and leg pain Go over lab results, vitamins low  Flu shot and pneumonia shot   Review of Systems     Objective:   Physical Exam General-in no acute distress Eyes-no discharge Lungs-respiratory rate normal, CTA CV-no murmurs,RRR Extremities skin warm dry no edema Neuro grossly normal Behavior normal, alert Trace edema in the ankles       Assessment & Plan:  1. Generalized anxiety disorder Patient feels that she needs to have 4 tablets/day with the Suboxone she is on I do not feel comfortable with her being on 4/day she is to stick with 3/day but we have given her some extra when she has panic attacks.  This patient has been on nerve medication and pain medicine for a long span of time We will continue the medication but will not increase the dosage  Patient does have elements of anxiety, PTSD, stress related to her disability she would benefit from seeing counselor on a regular basis - ALPRAZolam (XANAX) 1 MG tablet; Take 1 tablet (1 mg total) by mouth 4 (four) times daily as needed for anxiety.  Dispense: 98 tablet; Refill: 2 - CBC with Differential - Comprehensive metabolic panel - Quantiferon tb gold assay - QuantiFERON-TB Gold Plus - Ambulatory referral to Psychology  2. Pedal edema No more than 2 Lasix per day on other days only use 1 patient states she has been using 2 recently she is to do her blood work within the next 7 to 10 days  She is to take her potassium twice daily. - CBC with Differential -  Comprehensive metabolic panel - Quantiferon tb gold assay - QuantiFERON-TB Gold Plus  3. Psoriasis Patient states that her dermatologist With [facility] is requesting blood work before they put her on immunosuppression - CBC with Differential - Comprehensive metabolic panel - Quantiferon tb gold assay  4. Immunization due Flu and pneumonia vaccine today  5. Fibromyalgia This is by history she states she has muscle pain bilateral arms and legs all the time along with soreness in her joints and muscles she was told by specialist she has fibromyalgia Duloxetine should help I have told her she needs to stop taking the muscle relaxer immediately because of increased risk of accidental overdose Also told her it is imperative for her to do a sleep study with pulmonary ASAP  6. Post-COVID syndrome Start duloxetine Do counseling Patient is disabled Hopefully we will see some improvement over the course of the next several weeks Follow-up within 6 weeks

## 2021-12-23 LAB — COMPREHENSIVE METABOLIC PANEL
ALT: 17 IU/L (ref 0–32)
AST: 21 IU/L (ref 0–40)
Albumin/Globulin Ratio: 2 (ref 1.2–2.2)
Albumin: 4.7 g/dL (ref 3.9–4.9)
Alkaline Phosphatase: 86 IU/L (ref 44–121)
BUN/Creatinine Ratio: 12 (ref 9–23)
BUN: 11 mg/dL (ref 6–24)
Bilirubin Total: 0.4 mg/dL (ref 0.0–1.2)
CO2: 22 mmol/L (ref 20–29)
Calcium: 8.7 mg/dL (ref 8.7–10.2)
Chloride: 102 mmol/L (ref 96–106)
Creatinine, Ser: 0.9 mg/dL (ref 0.57–1.00)
Globulin, Total: 2.3 g/dL (ref 1.5–4.5)
Glucose: 95 mg/dL (ref 70–99)
Potassium: 4.3 mmol/L (ref 3.5–5.2)
Sodium: 140 mmol/L (ref 134–144)
Total Protein: 7 g/dL (ref 6.0–8.5)
eGFR: 83 mL/min/{1.73_m2} (ref >59)

## 2021-12-23 LAB — CBC WITH DIFFERENTIAL/PLATELET
Basophils Absolute: 0 10*3/uL (ref 0.0–0.2)
Basos: 1 %
EOS (ABSOLUTE): 0.1 10*3/uL (ref 0.0–0.4)
Eos: 1 %
Hematocrit: 41.9 % (ref 34.0–46.6)
Hemoglobin: 14.6 g/dL (ref 11.1–15.9)
Immature Grans (Abs): 0 10*3/uL (ref 0.0–0.1)
Immature Granulocytes: 0 %
Lymphocytes Absolute: 4.1 10*3/uL — ABNORMAL HIGH (ref 0.7–3.1)
Lymphs: 48 %
MCH: 29.7 pg (ref 26.6–33.0)
MCHC: 34.8 g/dL (ref 31.5–35.7)
MCV: 85 fL (ref 79–97)
Monocytes Absolute: 0.7 10*3/uL (ref 0.1–0.9)
Monocytes: 9 %
Neutrophils Absolute: 3.4 10*3/uL (ref 1.4–7.0)
Neutrophils: 41 %
Platelets: 273 10*3/uL (ref 150–450)
RBC: 4.92 x10E6/uL (ref 3.77–5.28)
RDW: 13.6 % (ref 11.7–15.4)
WBC: 8.4 10*3/uL (ref 3.4–10.8)

## 2021-12-25 LAB — QUANTIFERON-TB GOLD PLUS
QuantiFERON Mitogen Value: >10 IU/mL
QuantiFERON Nil Value: 0.04 IU/mL
QuantiFERON TB1 Ag Value: 0.07 IU/mL
QuantiFERON TB2 Ag Value: 0.09 IU/mL
QuantiFERON-TB Gold Plus: NEGATIVE

## 2021-12-29 ENCOUNTER — Ambulatory Visit: Payer: Medicaid Other | Admitting: Family Medicine

## 2021-12-29 ENCOUNTER — Other Ambulatory Visit: Payer: Self-pay | Admitting: Gastroenterology

## 2021-12-29 ENCOUNTER — Other Ambulatory Visit: Payer: Self-pay | Admitting: Family Medicine

## 2021-12-29 NOTE — Telephone Encounter (Signed)
May have 12 refills on Zofran 6 refills on Flovent refused metoprolol since it is already been refilled

## 2021-12-31 NOTE — Telephone Encounter (Signed)
6 months on the Zofran 6 months on the inhaler, patient already has refills of metoprolol

## 2022-01-05 NOTE — Telephone Encounter (Signed)
Patient is requesting increase to cymbalta 30 mg , she takes once daily , but would like to increase to twice daily due to feels like its worn off by the end of 5 hrs. States medication works great, no side effects, It has helped with pain, bipolar symptoms and sleeping also. Has taken daily since the 12/23/21.

## 2022-01-07 ENCOUNTER — Institutional Professional Consult (permissible substitution): Payer: Medicaid Other | Admitting: Neurology

## 2022-01-07 ENCOUNTER — Telehealth: Payer: Self-pay | Admitting: Family Medicine

## 2022-01-07 NOTE — Telephone Encounter (Signed)
Nurses-currently I do not see a message from 11/14 regarding her original request for increase of Cymbalta?  Talk with the patient to see what is going on typically with a medication like this we prefer to adjust the dosages when a person is present in the office-but given the circumstances see what is going on

## 2022-01-07 NOTE — Telephone Encounter (Signed)
Patient calling checking on message from 11/14 about increaSING HER CYMBALTA 30 MG. Please advise

## 2022-01-08 ENCOUNTER — Telehealth: Payer: Self-pay | Admitting: Family Medicine

## 2022-01-08 NOTE — Telephone Encounter (Signed)
Patient has called 3 times in the last two days about increasing her DULoxetine (CYMBALTA) 30 MG she wanting answer about this. FYI-She states her husband or mom  are on the same medication and she going to take some of their to the double MG because the 30 mg not working for her.

## 2022-01-08 NOTE — Telephone Encounter (Signed)
Patient is requesting increase to cymbalta 30 mg , she takes once daily , but would like to increase to twice daily due to feels like its worn off by the end of 5 hrs. States medication works great, no side effects, It has helped with pain, bipolar symptoms and sleeping also. Has taken daily since the 12/23/21.  Please advise. Thank you

## 2022-01-11 ENCOUNTER — Other Ambulatory Visit: Payer: Self-pay

## 2022-01-11 MED ORDER — DULOXETINE HCL 60 MG PO CPEP: 60.0000 mg | ORAL_CAPSULE | Freq: Every day | ORAL | 5 refills | Status: DC

## 2022-01-11 NOTE — Telephone Encounter (Signed)
Patient has been informed per drs orders and recommendations. Patient verbalizes understanding.

## 2022-01-11 NOTE — Telephone Encounter (Signed)
I am okay with going up on the dose to 60 mg daily The pharmacokinetics of how this medicine works is that it gets a steady state in the system by taking it once a day every day.  There is no need to do 30 mg twice daily.  Instead 60 mg once daily works fine because of the pharmacokinetics in other words how it is metabolized.  If patient is on board with going up on the dose to 60 mg you may send in #30 with 5 refills keep follow-up visit as planned

## 2022-01-19 ENCOUNTER — Ambulatory Visit: Payer: Medicaid Other | Admitting: Adult Health

## 2022-01-29 ENCOUNTER — Other Ambulatory Visit: Payer: Self-pay | Admitting: Family Medicine

## 2022-02-17 ENCOUNTER — Ambulatory Visit: Payer: Medicaid Other | Admitting: Family Medicine

## 2022-02-17 ENCOUNTER — Encounter: Payer: Self-pay | Admitting: Family Medicine

## 2022-02-17 VITALS — BP 116/80 | HR 77 | Temp 98.1°F | Wt 240.0 lb

## 2022-02-17 DIAGNOSIS — L409 Psoriasis, unspecified: Secondary | ICD-10-CM

## 2022-02-17 DIAGNOSIS — F411 Generalized anxiety disorder: Secondary | ICD-10-CM | POA: Diagnosis not present

## 2022-02-17 DIAGNOSIS — M797 Fibromyalgia: Secondary | ICD-10-CM | POA: Diagnosis not present

## 2022-02-17 DIAGNOSIS — R600 Localized edema: Secondary | ICD-10-CM

## 2022-02-17 MED ORDER — ALPRAZOLAM 1 MG PO TABS: 1.0000 mg | ORAL_TABLET | Freq: Four times a day (QID) | ORAL | 3 refills | Status: DC | PRN

## 2022-02-17 NOTE — Progress Notes (Signed)
   Subjective:    Patient ID: Belinda [name], female    DOB: 06-26-1980, 41 y.o.   MRN: [id number]  HPI Patient arrives for 2 mouth follow up. 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  She denies drowsiness with her medicines In addition to this gabapentin does help with her neuropathy in her legs Her moods are doing better on the Cymbalta She denies drowsiness or feeling drowsy/drugged   Review of Systems     Objective:   Physical Exam General-in no acute distress Eyes-no discharge Lungs-respiratory rate normal, CTA CV-no murmurs,RRR Extremities skin warm dry some edema Neuro grossly normal Behavior normal, alert        Assessment & Plan:  1. Generalized anxiety disorder She is doing well on the Cymbalta this helps her with her anxiety as well as helping her with her fibromyalgia She is still using her alprazolam not to exceed more than 4/day 98 tablets is the last for a month at a time refills were sent in - ALPRAZolam (XANAX) 1 MG tablet; Take 1 tablet (1 mg total) by mouth 4 (four) times daily as needed for anxiety.  Dispense: 98 tablet; Refill: 3  2. Pedal edema She does have some slight pedal edema not severe continue current medications as this  3. Psoriasis Severe psoriasis continue the creams patient possibly will be following  up with dermatology in the future  4. Fibromyalgia Cymbalta is helping her fibromyalgia Gabapentin is helping as well  Chronic pain and opioid history being followed by pain management She is disabled She has a follow-up within 3 to 4 months

## 2022-02-26 ENCOUNTER — Other Ambulatory Visit: Payer: Self-pay | Admitting: Gastroenterology

## 2022-02-26 DIAGNOSIS — K5904 Chronic idiopathic constipation: Secondary | ICD-10-CM

## 2022-03-04 ENCOUNTER — Other Ambulatory Visit (HOSPITAL_COMMUNITY)
Admission: RE | Admit: 2022-03-04 | Discharge: 2022-03-04 | Disposition: A | Discharge status: Home / Self Care | Payer: Medicaid Other | Source: Ambulatory Visit | Attending: Adult Health | Admitting: Adult Health

## 2022-03-04 ENCOUNTER — Ambulatory Visit (INDEPENDENT_AMBULATORY_CARE_PROVIDER_SITE_OTHER): Payer: Medicaid Other | Admitting: Adult Health

## 2022-03-04 ENCOUNTER — Encounter: Payer: Self-pay | Admitting: Adult Health

## 2022-03-04 VITALS — BP 112/72 | HR 81 | Ht 62.0 in | Wt 240.0 lb

## 2022-03-04 DIAGNOSIS — Z01419 Encounter for gynecological examination (general) (routine) without abnormal findings: Secondary | ICD-10-CM | POA: Diagnosis not present

## 2022-03-04 DIAGNOSIS — Z1211 Encounter for screening for malignant neoplasm of colon: Secondary | ICD-10-CM | POA: Insufficient documentation

## 2022-03-04 DIAGNOSIS — Z0000 Encounter for general adult medical examination without abnormal findings: Secondary | ICD-10-CM | POA: Diagnosis not present

## 2022-03-04 DIAGNOSIS — N926 Irregular menstruation, unspecified: Secondary | ICD-10-CM | POA: Diagnosis not present

## 2022-03-04 DIAGNOSIS — Z113 Encounter for screening for infections with a predominantly sexual mode of transmission: Secondary | ICD-10-CM

## 2022-03-04 LAB — HEMOCCULT GUIAC POC 1CARD (OFFICE): Fecal Occult Blood, POC: NEGATIVE

## 2022-03-04 NOTE — Progress Notes (Signed)
Patient ID: Belinda [name], female   DOB: 02/01/1981, 42 y.o.   MRN: [id number] History of Present Illness: Sarinity is a 42 year old white female, with SO, G2P2 in for well woman gyn exam and pap. She says periods are irregular. She had COVID in 2021 and was on vent for for 42 days, has has problems with left foot now and uses a wheelchair.  PCP is [facility].   Current Medications, Allergies, Past Medical History, Past Surgical History, Family History and Social History were reviewed in [employer] record.     Review of Systems: Patient denies any headaches, hearing loss, fatigue, blurred vision, shortness of breath, chest pain, abdominal pain, problems with bowel movements, urination, or intercourse. No joint pain or mood swings.  Periods irregular   Physical Exam:BP 112/72 (BP Location: Left Arm, Patient Position: Sitting, Cuff Size: Normal)   Pulse 81   Ht '5\' 2"'$  (1.575 m)   Wt 240 lb (108.9 kg)   LMP 01/16/2022 (Approximate)   BMI 43.90 kg/m  had sex christmas day, first time in a while General:  Well developed, well nourished, no acute distress Skin:  Warm and dry,has psoriasis  Neck:  Midline trachea, normal thyroid, good ROM, no lymphadenopathy Lungs; Clear to auscultation bilaterally Breast:  No dominant palpable mass, retraction, or nipple discharge Cardiovascular: Regular rate and rhythm Abdomen:  Soft, non tender, no hepatosplenomegaly Pelvic:  External genitalia is normal in appearance, no lesions.  The vagina is normal in appearance. Urethra has no lesions or masses. The cervix is bulbous,pap with HR HPV genotyping and GC/CHL performed.  Uterus is felt to be normal size, shape, and contour.  No adnexal masses or tenderness noted.Bladder is non tender, no masses felt. Rectal: Good sphincter tone, no polyps, or hemorrhoids felt.  Hemoccult negative. Extremities/musculoskeletal:  No swelling or varicosities noted, no clubbing or cyanosis Psych:   No mood changes, alert and cooperative,seems happy AA is 0 Fall risk is high    03/04/2022    3:37 PM 02/17/2022    3:48 PM 12/22/2021    4:42 PM  Depression screen PHQ 2/9  Decreased Interest '1 1 3  '$ Down, Depressed, Hopeless '1 1 2  '$ PHQ - 2 Score '2 2 5  '$ Altered sleeping '1 1 2  '$ Tired, decreased energy '2 3 3  '$ Change in appetite 1 0 2  Feeling bad or failure about yourself  2 0 2  Trouble concentrating '3 3 3  '$ Moving slowly or fidgety/restless 1 0 1  Suicidal thoughts 0 0 0  PHQ-9 Score '12 9 18  '$ Difficult doing work/chores  Not difficult at all Extremely dIfficult   On Cymbalta and xanax    03/04/2022    3:38 PM 02/17/2022    3:49 PM 12/22/2021    4:42 PM 08/17/2017    1:53 PM  GAD 7 : Generalized Anxiety Score  Nervous, Anxious, on Edge '1 3 3 3  '$ Control/stop worrying '2 1 3 3  '$ Worry too much - different things '2 2 3 3  '$ Trouble relaxing '2 1 3 3  '$ Restless '1 2 3 2  '$ Easily annoyed or irritable '2 1 2 2  '$ Afraid - awful might happen '2 2 3 1  '$ Total GAD 7 Score '12 12 20 17  '$ Anxiety Difficulty  Somewhat difficult Extremely difficult Very difficult    Upstream - 03/04/22 1531       Pregnancy Intention Screening   Does the patient want to become pregnant in the  next year? No    Does the patient's partner want to become pregnant in the next year? No    Would the patient like to discuss contraceptive options today? No      Contraception Wrap Up   Current Method Female Condom    End Method Female Condom              Examination chaperoned by Levy Pupa LPN   Impression and Plan: 1. Routine general medical examination at a health care facility Pap sent Pap in 3 years if normal Gyn exam in 1 year Labs with PCP - Cytology - PAP( [place])  2. Encounter for routine gynecological examination with Papanicolaou smear of cervix Pap sent  3. Encounter for screening fecal occult blood testing Hemoccult was negative  - POCT occult blood stool  4. Irregular periods LMP  01/16/22  Check QCHG at her request  - Beta hCG quant (ref lab)  5. Screening examination for STD (sexually transmitted disease) - HIV Antibody (routine testing w rflx) - RPR - Hepatitis B surface antigen

## 2022-03-05 LAB — BETA HCG QUANT (REF LAB): hCG Quant: <1 m[IU]/mL

## 2022-03-05 LAB — HIV ANTIBODY (ROUTINE TESTING W REFLEX): HIV Screen 4th Generation wRfx: NONREACTIVE

## 2022-03-05 LAB — RPR: RPR Ser Ql: NONREACTIVE

## 2022-03-05 LAB — HEPATITIS B SURFACE ANTIGEN: Hepatitis B Surface Ag: NEGATIVE

## 2022-03-08 ENCOUNTER — Institutional Professional Consult (permissible substitution): Payer: Medicaid Other | Admitting: Neurology

## 2022-03-08 ENCOUNTER — Encounter: Payer: Self-pay | Admitting: Neurology

## 2022-03-09 LAB — CYTOLOGY - PAP
Adequacy: ABSENT
Chlamydia: NEGATIVE
Comment: NEGATIVE
Comment: NEGATIVE
Comment: NORMAL
Diagnosis: NEGATIVE
High risk HPV: NEGATIVE
Neisseria Gonorrhea: NEGATIVE

## 2022-03-15 ENCOUNTER — Telehealth: Payer: Self-pay | Admitting: Gastroenterology

## 2022-03-15 NOTE — Telephone Encounter (Signed)
Patient left a message that she has a problem  with her insurance company.... she is trying to get them to override on a medication that she has been taking for a long time and pay for it.  She asked if we could call her back.  [phone]

## 2022-03-16 NOTE — Telephone Encounter (Signed)
Spoke with the pt and she is needing a refill on her Dexilant. Last ov was 08/2021.

## 2022-03-17 ENCOUNTER — Other Ambulatory Visit (INDEPENDENT_AMBULATORY_CARE_PROVIDER_SITE_OTHER): Payer: Self-pay | Admitting: Gastroenterology

## 2022-03-17 MED ORDER — DEXLANSOPRAZOLE 60 MG PO CPDR: 1.0000 | DELAYED_RELEASE_CAPSULE | Freq: Every day | ORAL | 3 refills | Status: DC

## 2022-03-17 NOTE — Telephone Encounter (Signed)
Noted  

## 2022-03-17 NOTE — Telephone Encounter (Signed)
Yes it will but a refill goes in first. thanks

## 2022-03-18 NOTE — Telephone Encounter (Signed)
Received approval for Dexilant. Sent copy to scan center.

## 2022-03-29 ENCOUNTER — Other Ambulatory Visit: Payer: Self-pay | Admitting: Family Medicine

## 2022-03-30 NOTE — Telephone Encounter (Signed)
Ok--6 months 

## 2022-04-09 ENCOUNTER — Encounter: Payer: Self-pay | Admitting: Family Medicine

## 2022-04-09 ENCOUNTER — Other Ambulatory Visit: Payer: Self-pay | Admitting: Family Medicine

## 2022-04-09 MED ORDER — VALACYCLOVIR HCL 1 G PO TABS: 1000.0000 mg | ORAL_TABLET | Freq: Three times a day (TID) | ORAL | 0 refills | Status: AC

## 2022-04-09 NOTE — Telephone Encounter (Signed)
Coral Spikes, DO     Rx sent for valtrex.

## 2022-04-13 ENCOUNTER — Telehealth: Payer: Self-pay | Admitting: *Deleted

## 2022-04-13 NOTE — Telephone Encounter (Signed)
Patient stated her fiance tested positive for Covid last week and she started with congestion cough and not feeling well over the weekend- No SOB  She has not tested yet but is going to get a test this afternoon and test. Patient states she would like an antibiotic sent in so it does not go into pneumonia like it did last time she had covid. Patient stated her mucus is turning green. Patient states she is currently also on valtrex for shingles in her rib area.   [place]

## 2022-04-13 NOTE — Telephone Encounter (Signed)
Pt called into office and states she has tested positive. Pt would like to do a virtual visit because its so hard on her with getting a shower and getting ready. Please advise. Thank you

## 2022-04-13 NOTE — Telephone Encounter (Signed)
Pt contacted and informed that provider will call her between 10:30-1. Per Autumn.  Pt verbalized understanding.

## 2022-04-13 NOTE — Telephone Encounter (Signed)
Autumn/nurses-I do not feel comfortable treating this via phone.  If the patient would like to be worked in tomorrow afternoon that would be fine.  Otherwise urgent care.  It would be wise for her to do a COVID test.  Once again we are willing to work her into the schedule tomorrow but I do not feel it is good healthcare to treat this unseen  (Should be noted that her pneumonia last time was due to [place] all the more reason for her to do a COVID test and possibly will need to be on Paxlovid.  Once again we are not refusing to take care of her we are willing to see her tomorrow)

## 2022-04-13 NOTE — Telephone Encounter (Signed)
Hi-I request that she be added as a virtual visit.  I will accomplish this virtual visit between 10:30 AM and 1 PM depending on the flow of the schedule.  Please make sure she is on the schedule and marked as a virtual visit thank you

## 2022-04-13 NOTE — Telephone Encounter (Signed)
Pt requesting Paxlovid to go to [facility].  [facility] does not have Paxlovid

## 2022-04-14 ENCOUNTER — Telehealth (INDEPENDENT_AMBULATORY_CARE_PROVIDER_SITE_OTHER): Payer: Medicaid Other | Admitting: Family Medicine

## 2022-04-14 DIAGNOSIS — U071 COVID-19: Secondary | ICD-10-CM | POA: Diagnosis not present

## 2022-04-14 DIAGNOSIS — J019 Acute sinusitis, unspecified: Secondary | ICD-10-CM

## 2022-04-14 MED ORDER — NIRMATRELVIR/RITONAVIR (PAXLOVID)TABLET: 3.0000 | ORAL_TABLET | Freq: Two times a day (BID) | ORAL | 0 refills | Status: AC

## 2022-04-14 MED ORDER — AMOXICILLIN-POT CLAVULANATE 875-125 MG PO TABS: 1.0000 | ORAL_TABLET | Freq: Two times a day (BID) | ORAL | 0 refills | Status: DC

## 2022-04-14 NOTE — Telephone Encounter (Signed)
Pt has been placed on provider schedule

## 2022-04-14 NOTE — Progress Notes (Signed)
   Subjective:    Patient ID: Belinda [name], female    DOB: 1980-03-16, 42 y.o.   MRN: [id number]  HPI Body aches headache congestion not feeling good symptoms over the past 3 days COVID test positive Patient has had a previous COVID infection and put her in the hospital and almost killed her she is very worried about this.  She relates a lot of cough and congestion bringing up some yellow phlegm but her O2 saturation ranging between 90-94    Review of Systems     Objective:   Physical Exam On the video she does not appear to be toxic she is able to relate okay so I do not feel she is in any respiratory distress she show me the O2 sat meter overall looks good 93% currently       Assessment & Plan:  COVID Paxlovid Cut dose of metoprolol in half for 5 days Antibiotic sent in as well If not improving over the next 48 hours recheck Follow-up sooner problems

## 2022-04-28 ENCOUNTER — Telehealth: Payer: Self-pay

## 2022-04-28 ENCOUNTER — Other Ambulatory Visit: Payer: Self-pay | Admitting: Family Medicine

## 2022-04-28 MED ORDER — FLUTICASONE PROPIONATE 50 MCG/ACT NA SUSP: NASAL | 0 refills | Status: DC

## 2022-04-28 MED ORDER — GABAPENTIN 300 MG PO CAPS: ORAL_CAPSULE | ORAL | 0 refills | Status: DC

## 2022-04-28 MED ORDER — CLOBETASOL PROPIONATE 0.05 % EX SOLN: CUTANEOUS | 0 refills | Status: DC

## 2022-04-28 MED ORDER — FAMOTIDINE 40 MG PO TABS: 40.0000 mg | ORAL_TABLET | Freq: Every day | ORAL | 0 refills | Status: DC

## 2022-04-28 NOTE — Telephone Encounter (Signed)
Prescriptions sent electronically to pharmacy. Patient notified. °

## 2022-04-28 NOTE — Telephone Encounter (Signed)
Prescription Request  04/28/2022  LOV: Visit date not found  What is the name of the medication or equipment? fluticasone (FLONASE) 50 MCG/ACT nasal spray, clobetasol (TEMOVATE) 0.05 % external solution ,famotidine (PEPCID) 40 MG tablet ,   gabapentin (NEURONTIN) 300 MG capsule    Have you contacted your pharmacy to request a refill? Yes  Which pharmacy would you like this sent to [place]  Patient notified that their request is being sent to the clinical staff for review and that they should receive a response within 2 business days.   Please advise at Mobile [phone] (mobile)

## 2022-04-28 NOTE — Addendum Note (Signed)
Addended by: Dairl Ponder on: 04/28/2022 04:53 PM   Modules accepted: Orders

## 2022-04-28 NOTE — Telephone Encounter (Signed)
For these medicines I am fine with 6 refills each keep all regular follow-up visits

## 2022-05-07 ENCOUNTER — Other Ambulatory Visit (INDEPENDENT_AMBULATORY_CARE_PROVIDER_SITE_OTHER): Payer: Self-pay | Admitting: Gastroenterology

## 2022-05-07 ENCOUNTER — Other Ambulatory Visit: Payer: Self-pay | Admitting: Family Medicine

## 2022-05-07 NOTE — Telephone Encounter (Signed)
Last visit 09/04/21 with Belinda [name]

## 2022-05-28 ENCOUNTER — Other Ambulatory Visit: Payer: Self-pay | Admitting: Family Medicine

## 2022-06-07 ENCOUNTER — Telehealth: Payer: Self-pay | Admitting: Family Medicine

## 2022-06-07 NOTE — Telephone Encounter (Signed)
You can try to get approval but more than likely they will not approve this.  Patient would end up having to pay for a Xanax.  We have been working with the patient to try to taper down on her Xanax use.  It has been a long difficult struggle.  She has an appointment coming up in approximately 10 days we will discuss this more.  Please try to do prior approval.  If it is being denied due to this reason there is nothing more that we can do regarding this.  Please inform patient of the situation.  It would be advisable for the patient to continue on her medicine and not stop it abruptly because it could cause a seizure to stop abruptly

## 2022-06-07 NOTE — Telephone Encounter (Signed)
Prior Auth for ALPRAZolam Prudy Feeler) 1 MG tablet  Insurance Info: PLN [id number] BIN [id number] Cardholder ID [id number] K Help Desk # [phone] Message: FND-BUPRENOPHIN-NALOXON 8-2 M S L; concurrent drug use opioids and benzodia

## 2022-06-09 ENCOUNTER — Other Ambulatory Visit: Payer: Self-pay | Admitting: Family Medicine

## 2022-06-11 NOTE — Telephone Encounter (Signed)
Patient picked up medication on 05/28/22 per pharmacy and does not need anything further

## 2022-06-16 ENCOUNTER — Encounter: Payer: Self-pay | Admitting: Family Medicine

## 2022-06-16 ENCOUNTER — Ambulatory Visit: Payer: Medicaid Other | Admitting: Family Medicine

## 2022-06-16 VITALS — BP 100/68 | HR 81

## 2022-06-16 DIAGNOSIS — J4530 Mild persistent asthma, uncomplicated: Secondary | ICD-10-CM

## 2022-06-16 DIAGNOSIS — K760 Fatty (change of) liver, not elsewhere classified: Secondary | ICD-10-CM | POA: Diagnosis not present

## 2022-06-16 DIAGNOSIS — F411 Generalized anxiety disorder: Secondary | ICD-10-CM | POA: Diagnosis not present

## 2022-06-16 DIAGNOSIS — R0683 Snoring: Secondary | ICD-10-CM

## 2022-06-16 DIAGNOSIS — M25561 Pain in right knee: Secondary | ICD-10-CM | POA: Diagnosis not present

## 2022-06-16 DIAGNOSIS — R7989 Other specified abnormal findings of blood chemistry: Secondary | ICD-10-CM

## 2022-06-16 DIAGNOSIS — E781 Pure hyperglyceridemia: Secondary | ICD-10-CM

## 2022-06-16 DIAGNOSIS — L209 Atopic dermatitis, unspecified: Secondary | ICD-10-CM

## 2022-06-16 MED ORDER — DULOXETINE HCL 60 MG PO CPEP: 60.0000 mg | ORAL_CAPSULE | Freq: Every day | ORAL | 4 refills | Status: DC

## 2022-06-16 MED ORDER — ALPRAZOLAM 1 MG PO TABS
1.0000 mg | ORAL_TABLET | Freq: Four times a day (QID) | ORAL | 3 refills | Status: DC | PRN
Start: 2022-06-16 — End: 2022-06-28

## 2022-06-16 NOTE — Progress Notes (Signed)
Subjective:    Patient ID: Belinda [name], female    DOB: [birth year], 42 y.o.   MRN: [id number]  HPI Patient arrives today for 3 month follow up. Patient is having pain in right knee. She is having right knee pain and discomfort hurts in the medial portion hurts with certain movements.  Denies any injury to it.  States at times it swells up.  She does do some standing for weight transfers but she really does not do any walking she is wheelchair-bound She is disabled from COVID She also has opioid use disorder for which she is under the care of a specialist at [facility] In addition to this patient has chronic anxiety has been on Xanax ever since being a teenager.  She was taking 4 tablets a day and we have gradually worked her down to 3 tablets daily with an additional 8 tablets/month that can be used with panic attacks She has several panic attacks per month.  I have encouraged her to be under the care of of counseling and therapy for her anxiety.  Patient relates that she has tried this before and it did not help. We have also encouraged her to see psychiatry but she states that that has not helped her in the past she is taking Cymbalta for depression and anxiety as well as using the alprazolam She denies drowsiness but she does admit to snoring she will need to have testing to rule out sleep apnea  Review of Systems     Objective:   Physical Exam General-in no acute distress Eyes-no discharge Lungs-respiratory rate normal, CTA CV-no murmurs,RRR Extremities skin warm dry no edema Neuro grossly normal Behavior normal, alert        Assessment & Plan:  1. Acute pain of right knee Refer to orthopedics they may try injections I recommend in the meantime using Voltaren gel 3 times daily - Ambulatory referral to Orthopedics  2. Generalized anxiety disorder She is on alprazolam she has been on nerve medicine ever since being a teenager.  She is on long-term treatment of  this.  It is very difficult to wean these individuals off.  In the past and even in the present she has been reluctant to see psychiatry for further evaluation. She is seen psychiatry in the past has done counseling in the past stated that she did not see any improvement beyond what her medications do Patient is aware that pain medicine and nerve medication together increased risk of accidental overdose She states that behavioral evaluation through [facility] told her that in her situation she could continue with the current dosing of her medicines She states she did see psychiatry with behavioral health with [facility] they did an evaluation and told her that he would be okay for her to continue her current medicines and also that if she wanted to start coming there in the future she could Continue duloxetine - ALPRAZolam (XANAX) 1 MG tablet; Take 1 tablet (1 mg total) by mouth 4 (four) times daily as needed for anxiety.  Dispense: 98 tablet; Refill: 3 patient states she takes Xanax 3 times daily and she will take an additional 1 if she has a panic attack she states she has at least 8 panic attacks per month.  3. Snoring Sleep studies recommended patient snores at night at high risk of having sleep apnea with her medicines that is dangerous Patient agrees to get evaluated - Ambulatory referral to Sleep Studies  4. Fatty liver Check lab work await results - Lipid panel - Comprehensive metabolic panel  5. Elevated TSH More recent TSH look good continue current measures  6. Atopic dermatitis, unspecified type Patient with severe psoriasis and atopic dermatitis she wants to see allergist to see if she is allergic to any foods that could be exacerbating this - Ambulatory referral to Allergy  7. Hypertriglyceridemia Check lipid level  8. Mild persistent reactive airway disease without complication Pneumococcal 20

## 2022-06-17 ENCOUNTER — Ambulatory Visit: Payer: Medicaid Other | Admitting: Family Medicine

## 2022-06-25 ENCOUNTER — Other Ambulatory Visit: Payer: Self-pay | Admitting: Family Medicine

## 2022-06-25 ENCOUNTER — Telehealth: Payer: Self-pay

## 2022-06-25 NOTE — Telephone Encounter (Signed)
Prescription Request  06/25/2022  LOV: Visit date not found  What is the name of the medication or equipment? ALPRAZolam Prudy Feeler) 1 MG tablet   Have you contacted your pharmacy to request a refill? Yes   Which pharmacy would you like this sent to?  [facility] - [place], [place] - [address] [place] [place] [address] Phone: [phone] Fax: [phone]    Patient notified that their request is being sent to the clinical staff for review and that they should receive a response within 2 business days.   Please advise at Mobile [phone] (mobile)

## 2022-06-28 ENCOUNTER — Other Ambulatory Visit: Payer: Self-pay | Admitting: Family Medicine

## 2022-06-28 DIAGNOSIS — F411 Generalized anxiety disorder: Secondary | ICD-10-CM

## 2022-06-28 MED ORDER — ALPRAZOLAM 1 MG PO TABS
1.0000 mg | ORAL_TABLET | Freq: Four times a day (QID) | ORAL | 3 refills | Status: DC | PRN
Start: 2022-06-28 — End: 2022-07-28

## 2022-06-28 NOTE — Telephone Encounter (Signed)
HI I sent in her medication previously at her most recent office visit I sent it again today to [facility] She needs to check with the pharmacy again there should be enough refills to last her until her follow-up visit in late July

## 2022-07-16 ENCOUNTER — Other Ambulatory Visit: Payer: Self-pay

## 2022-07-16 ENCOUNTER — Telehealth: Payer: Self-pay | Admitting: Family Medicine

## 2022-07-16 ENCOUNTER — Telehealth: Payer: Self-pay

## 2022-07-16 ENCOUNTER — Other Ambulatory Visit: Payer: Self-pay | Admitting: Family Medicine

## 2022-07-16 MED ORDER — CALCIPOTRIENE 0.005 % EX SOLN: CUTANEOUS | 4 refills | Status: AC

## 2022-07-16 NOTE — Telephone Encounter (Signed)
Sent as directed thank you  As for the bowel movements could be related into the lungs this I would recommend that she touch base with her gastroenterology people I would not recommend any specific testing currently  If she starts having black tarry stools or other issues this would need testing for blood

## 2022-07-16 NOTE — Telephone Encounter (Signed)
Patient called in regard to Black shinny stool in balls. Happens every time she uses the bathroom. Patient is concerned and wants call back in regard.    Also patient is unhappy with current Dermatologist will be looking for a new one . Patient is requesting that provider refill calcipotriene solution until she is able to start with new dermatologist.

## 2022-07-16 NOTE — Telephone Encounter (Signed)
Spoke with patient , stool are round formed and shiny - has been taking linzess and will be picking up trulance today.  Calcipotrene solution 0.005 % 60 ml bid is her request , used for scales of on her skin for psoriasis to [facility] pharmacy.  She states prefers to have her pcp  send this medication instead of dermatology , or may refer to dermatology if needed.

## 2022-07-16 NOTE — Telephone Encounter (Signed)
1.  Nurses-please talk with the patient regarding her stool  #2-please verify which medicine she needs a refill on and which pharmacy was she getting this medicine. We will try to get it refilled but if it requires a prior approval this may be something that has to be done through her dermatologist and not a family physician  #3 if she needs a new referral for dermatology please do so but unfortunately there are limited number of specialist taking Medicaid so I am not sure where a new dermatologist will be and how soon or how long she will have to wait

## 2022-07-16 NOTE — Telephone Encounter (Signed)
Patient notified of provider's recommendations and verbalized understanding.  

## 2022-07-28 ENCOUNTER — Other Ambulatory Visit: Payer: Self-pay | Admitting: Family Medicine

## 2022-07-28 ENCOUNTER — Telehealth: Payer: Self-pay | Admitting: *Deleted

## 2022-07-28 DIAGNOSIS — F411 Generalized anxiety disorder: Secondary | ICD-10-CM

## 2022-07-28 MED ORDER — ALPRAZOLAM 1 MG PO TABS
1.0000 mg | ORAL_TABLET | Freq: Four times a day (QID) | ORAL | 0 refills | Status: AC | PRN
Start: 2022-07-28 — End: ?

## 2022-07-28 MED ORDER — ALPRAZOLAM 1 MG PO TABS
1.0000 mg | ORAL_TABLET | Freq: Four times a day (QID) | ORAL | 0 refills | Status: DC | PRN
Start: 2022-07-28 — End: 2022-07-28

## 2022-07-28 NOTE — Telephone Encounter (Signed)
Nurses Asked for her medicine we cannot help the fact that her insurance will not pay for this 1 additional prescription was sent in  Given current practice guidelines for primary care via [place] we will not be able to prescribe Xanax ongoing I recommend referral to behavioral health with [facility] Please let the patient know that he will be up to them on whether or not they continue to prescribe this medicine There is a potential risk that this medicine and her pain medicine could interact together Because of this it falls outside of our guidelines that I follow on behalf of [place] primary care I would recommend making consultation with the patient.  We can discuss with her a safe way of tapering this medicine.  She should not abruptly stop this medicine because she could have a seizure.  If she is not interested in tapering then it would be purely up to the behavioral health if they continue this medicine or not Unfortunately we will not be able to prescribe ongoing long-term. All of these to visions are to follow the guidelines and to be safe for her

## 2022-07-28 NOTE — Telephone Encounter (Signed)
Patient states she has been on Xanax for over 20 years and she can not stop now- patient stated pain management sent her to a psych in [place] and he had no problem giving her the xanax and she turned it down because she wanted Dr Lorin Picket to continue but stated she will call them in the morning and tell them she wants them to take it over.

## 2022-07-28 NOTE — Telephone Encounter (Signed)
So I certainly understand her point of view that she does not want to stop her medication now Please have him send [place] the name of the specialist that stated that they would assume control of this We will put in a referral to them and future prescriptions will come from them.  Obviously let us know if there is any particular other issues She can send [place] the name and number of this person via MyChart if she wishes so we can send that information from [place] thank you  We did send in 1 prescription but in the meantime she needs to set up this next avenue If for some reason they refuse to take this over we will be doing a gradual taper over the next multiple weeks

## 2022-07-28 NOTE — Telephone Encounter (Signed)
Patient called and states that she needs her medicine called in today she is going to be out  Insurance is not paying for medication currently and patient is having to pay out of pocket   [facility]

## 2022-07-29 ENCOUNTER — Other Ambulatory Visit: Payer: Self-pay | Admitting: Family Medicine

## 2022-07-29 MED ORDER — GABAPENTIN 300 MG PO CAPS: ORAL_CAPSULE | ORAL | 4 refills | Status: DC

## 2022-07-29 NOTE — Telephone Encounter (Signed)
Sent message via MyChart.

## 2022-07-30 ENCOUNTER — Telehealth: Payer: Self-pay

## 2022-07-30 MED ORDER — CLOBETASOL PROPIONATE 0.05 % EX SOLN: CUTANEOUS | 0 refills | Status: DC

## 2022-07-30 NOTE — Telephone Encounter (Signed)
Pt needs more of the clobetasol (TEMOVATE) 0.05 % external solution she said not enough to go in hair and body pt normally gets 100 ml and she got 50 ml   [employer] [phone]

## 2022-08-13 ENCOUNTER — Telehealth: Payer: Self-pay

## 2022-08-13 ENCOUNTER — Other Ambulatory Visit: Payer: Self-pay

## 2022-08-13 DIAGNOSIS — F411 Generalized anxiety disorder: Secondary | ICD-10-CM

## 2022-08-13 MED ORDER — DULOXETINE HCL 60 MG PO CPEP: 60.0000 mg | ORAL_CAPSULE | Freq: Every day | ORAL | 4 refills | Status: DC

## 2022-08-13 NOTE — Telephone Encounter (Signed)
Prescription Request  08/13/2022  LOV: Visit date not found  What is the name of the medication or equipment? DULoxetine (CYMBALTA) 60 MG capsule   Have you contacted your pharmacy to request a refill? Yes   Which pharmacy would you like this sent to?  [facility] - [place], [place] - [address] [place] [place] [address] Phone: [phone] Fax: [phone]    Patient notified that their request is being sent to the clinical staff for review and that they should receive a response within 2 business days.   Please advise at Mobile [phone] (mobile)

## 2022-08-13 NOTE — Telephone Encounter (Signed)
Script sent to [facility] pharmacy

## 2022-08-13 NOTE — Telephone Encounter (Signed)
Patient wants to know if someone will call her something in for a UTI.  She usually sees Stephen. I told her that Victorino Dike was not here. Patient wanted to know if someone else can call it in.

## 2022-08-16 NOTE — Telephone Encounter (Signed)
Error

## 2022-08-27 ENCOUNTER — Other Ambulatory Visit: Payer: Self-pay | Admitting: Family Medicine

## 2022-08-31 NOTE — Telephone Encounter (Signed)
May have refill x 1 on all she has a follow-up office visit toward the end of July

## 2022-09-17 ENCOUNTER — Ambulatory Visit: Payer: Medicaid Other | Admitting: Family Medicine

## 2022-09-27 ENCOUNTER — Other Ambulatory Visit: Payer: Self-pay | Admitting: Family Medicine

## 2022-10-27 ENCOUNTER — Other Ambulatory Visit: Payer: Self-pay | Admitting: Family Medicine

## 2022-11-26 ENCOUNTER — Other Ambulatory Visit: Payer: Self-pay | Admitting: Family Medicine

## 2022-12-15 ENCOUNTER — Other Ambulatory Visit: Payer: Self-pay | Admitting: Family Medicine

## 2022-12-24 ENCOUNTER — Other Ambulatory Visit (INDEPENDENT_AMBULATORY_CARE_PROVIDER_SITE_OTHER): Payer: Self-pay | Admitting: Gastroenterology

## 2022-12-24 ENCOUNTER — Telehealth: Payer: Self-pay | Admitting: Family Medicine

## 2022-12-24 NOTE — Telephone Encounter (Signed)
May have 30 days Needs to schedule office visit with Belinda [name] or myself

## 2022-12-27 ENCOUNTER — Telehealth: Payer: Self-pay | Admitting: *Deleted

## 2022-12-27 ENCOUNTER — Other Ambulatory Visit: Payer: Self-pay | Admitting: Gastroenterology

## 2022-12-27 ENCOUNTER — Telehealth: Payer: Self-pay

## 2022-12-27 DIAGNOSIS — K219 Gastro-esophageal reflux disease without esophagitis: Secondary | ICD-10-CM

## 2022-12-27 MED ORDER — DEXLANSOPRAZOLE 60 MG PO CPDR
1.0000 | DELAYED_RELEASE_CAPSULE | Freq: Every day | ORAL | 0 refills | Status: AC
Start: 2022-12-27 — End: ?

## 2022-12-27 NOTE — Telephone Encounter (Signed)
Pt need refill on Dexilant. Pt last OV 09/07/2021.

## 2022-12-27 NOTE — Telephone Encounter (Signed)
Pt is also saying her medication for the clobetasol (TEMOVATE) 0.05 % external solution  some month is is 50ml and then others she gets it supposed to be 100 ml because she has to used for hair body.

## 2022-12-27 NOTE — Telephone Encounter (Signed)
Last seen by Belenda Cruise 09/04/21

## 2022-12-27 NOTE — Telephone Encounter (Signed)
I will send in a limited supply. She is due for an OV as it has been over 1 year since I saw her last. Please arrange.

## 2022-12-27 NOTE — Telephone Encounter (Signed)
Pt is calling she only got 90 she is wanting to see if the remaining 180 can be sent in for the full 30 day supply. gabapentin (NEURONTIN) 300 MG capsule   Pt call back [phone]

## 2022-12-28 NOTE — Telephone Encounter (Signed)
Noted  

## 2022-12-29 NOTE — Telephone Encounter (Signed)
See My chart message

## 2023-01-05 ENCOUNTER — Encounter: Payer: Self-pay | Admitting: Nurse Practitioner

## 2023-01-05 ENCOUNTER — Ambulatory Visit: Payer: MEDICAID | Admitting: Nurse Practitioner

## 2023-01-05 VITALS — BP 102/70 | HR 95 | Temp 97.7°F

## 2023-01-05 DIAGNOSIS — M21371 Foot drop, right foot: Secondary | ICD-10-CM

## 2023-01-05 DIAGNOSIS — J019 Acute sinusitis, unspecified: Secondary | ICD-10-CM | POA: Diagnosis not present

## 2023-01-05 DIAGNOSIS — J4530 Mild persistent asthma, uncomplicated: Secondary | ICD-10-CM

## 2023-01-05 DIAGNOSIS — B9689 Other specified bacterial agents as the cause of diseases classified elsewhere: Secondary | ICD-10-CM | POA: Diagnosis not present

## 2023-01-05 MED ORDER — GABAPENTIN 300 MG PO CAPS: ORAL_CAPSULE | ORAL | 5 refills | Status: DC

## 2023-01-05 MED ORDER — AMOXICILLIN-POT CLAVULANATE 875-125 MG PO TABS: 1.0000 | ORAL_TABLET | Freq: Two times a day (BID) | ORAL | 0 refills | Status: DC

## 2023-01-05 MED ORDER — BUDESONIDE-FORMOTEROL FUMARATE 80-4.5 MCG/ACT IN AERO: 2.0000 | INHALATION_SPRAY | Freq: Two times a day (BID) | RESPIRATORY_TRACT | 5 refills | Status: DC

## 2023-01-05 NOTE — Progress Notes (Signed)
Subjective:    Patient ID: Belinda [name], female    DOB: 04/28/1980, 42 y.o.   MRN: [id number]  HPI Presents for complaints of head congestion and cough for the past month.  Fever initially.  Now having generalized sinus pressure.  Ear pain, more on the left.  Sore throat.  Frequent cough producing green mucus at times.  Slight wheezing times, has been using her albuterol inhaler frequently but tries to avoid this due to palpitations.  Is not currently using her Flovent, unsure whether she should use this while also using Flonase.  Continues to smoke about 1/2 pack/day. Patient also requesting a refill on her gabapentin for her right leg spasms and foot drop.  This is working very well at current dose.     Review of Systems  Constitutional:  Positive for fatigue. Negative for fever.  HENT:  Positive for congestion, ear pain, postnasal drip, sinus pressure, sinus pain and sore throat.   Respiratory:  Positive for cough, chest tightness, shortness of breath and wheezing.   Cardiovascular:  Negative for chest pain.       Objective:   Physical Exam NAD.  Alert, oriented.  TMs clear effusion, significantly more on the right side, no erythema.  Nares clear.  Pharynx mild erythema, mucous membranes moist.  No exudate noted.  Neck supple with mild soft slightly tender anterior cervical adenopathy.  Lungs clear.  Occasional cough noted.  No tachypnea.  Heart regular rate rhythm.   Today's Vitals   01/05/23 1630  BP: 102/70  Pulse: 95  Temp: 97.7 F (36.5 C)  SpO2: 98%   There is no height or weight on file to calculate BMI.         Assessment & Plan:   Problem List Items Addressed This Visit       Respiratory   Acute bacterial rhinosinusitis - Primary   Relevant Medications   amoxicillin-clavulanate (AUGMENTIN) 875-125 MG tablet   Mild persistent asthma without complication   Relevant Medications   budesonide-formoterol (SYMBICORT) 80-4.5 MCG/ACT inhaler     Other   Right  foot drop   Meds ordered this encounter  Medications   gabapentin (NEURONTIN) 300 MG capsule    Sig: Take 3 capsules po 3 times daily    Dispense:  90 capsule    Refill:  5   amoxicillin-clavulanate (AUGMENTIN) 875-125 MG tablet    Sig: Take 1 tablet by mouth 2 (two) times daily.    Dispense:  14 tablet    Refill:  0    Order Specific Question:   Supervising Provider    Answer:   Lilyan Punt A [[id number]]   budesonide-formoterol (SYMBICORT) 80-4.5 MCG/ACT inhaler    Sig: Inhale 2 puffs into the lungs 2 (two) times daily.    Dispense:  1 each    Refill:  5    Please dispense name brand Symbicort per Medicaid formulary. Thanks.    Order Specific Question:   Supervising Provider    Answer:   Lilyan Punt A [[id number]]   Stop Flovent.  Switch to Symbicort as directed.  Explained to patient that the albuterol remains her rescue inhaler.  Verbalizes understanding.  Discussed smoking cessation. Start Augmentin as directed.  OTC meds as directed for congestion and cough. Continue gabapentin at current dose. Return if symptoms worsen or fail to improve.  Portions of this report may have been transcribed using voice recognition software. Every effort was made to ensure accuracy; however, inadvertent computerized transcription errors may  be present.

## 2023-01-06 ENCOUNTER — Other Ambulatory Visit: Payer: Self-pay | Admitting: Family Medicine

## 2023-01-06 ENCOUNTER — Telehealth: Payer: Self-pay

## 2023-01-06 MED ORDER — GABAPENTIN 300 MG PO CAPS: ORAL_CAPSULE | ORAL | 4 refills | Status: DC

## 2023-01-06 NOTE — Telephone Encounter (Signed)
Corrected prescription was sent

## 2023-01-06 NOTE — Telephone Encounter (Signed)
This was previously handled thank you-earlier today

## 2023-01-06 NOTE — Telephone Encounter (Signed)
Copied from CRM [id number]. Topic: Clinical - Prescription Issue >> Jan 06, 2023 10:42 AM Adelina Mings wrote: Reason for CRM: Needs 270 count

## 2023-01-06 NOTE — Telephone Encounter (Signed)
Pt called the [facility] and said that her RX was wrote wrong  that she normally gets 270 in gabapentin (NEURONTIN) 300 MG capsule for a 90 day supply she take this 3 times daily and she wants to see if the corrected amount can be sent to the pharmacy

## 2023-01-26 ENCOUNTER — Other Ambulatory Visit: Payer: Self-pay | Admitting: Family Medicine

## 2023-02-25 ENCOUNTER — Other Ambulatory Visit: Payer: Self-pay | Admitting: Family Medicine

## 2023-03-03 ENCOUNTER — Other Ambulatory Visit: Payer: Self-pay | Admitting: Family Medicine

## 2023-03-28 ENCOUNTER — Other Ambulatory Visit: Payer: Self-pay | Admitting: Family Medicine

## 2023-03-29 ENCOUNTER — Other Ambulatory Visit: Payer: Self-pay

## 2023-03-29 MED ORDER — FLUTICASONE PROPIONATE 50 MCG/ACT NA SUSP: NASAL | 1 refills | Status: DC

## 2023-04-14 ENCOUNTER — Other Ambulatory Visit: Payer: Self-pay | Admitting: Family Medicine

## 2023-04-14 MED ORDER — METOPROLOL TARTRATE 25 MG PO TABS: ORAL_TABLET | ORAL | 0 refills | Status: DC

## 2023-04-14 NOTE — Telephone Encounter (Signed)
Copied from CRM [id number]. Topic: Clinical - Medication Refill >> Apr 14, 2023  2:18 PM Turkey B wrote: Most Recent Primary Care Visit:  Provider: Campbell Riches  Department: [facility]  Visit Type: OFFICE VISIT  Date: 01/05/2023  Medication: metoprolol tartrate (LOPRESSOR) 25 MG tablet Pt is taking 2 a day instead of 1 aday s per Dr Gerda Diss she says Has the patient contacted their pharmacy? yes (Agent: If yes, when and what did the pharmacy advise?)contact pcp  Is this the correct pharmacy for this prescription? yes  This is the patient's preferred pharmacy:  [facility] - [place], [place] - [address] [place] [address] Phone: [phone] Fax: [phone]   Has the prescription been filled recently? no  Is the patient out of the medication? yes  Has the patient been seen for an appointment in the last year OR does the patient have an upcoming appointment? yes  Can we respond through MyChart? yes  Agent: Please be advised that Rx refills may take up to 3 business days. We ask that you follow-up with your pharmacy.

## 2023-04-15 MED ORDER — METOPROLOL TARTRATE 25 MG PO TABS: ORAL_TABLET | ORAL | 1 refills | Status: DC

## 2023-04-15 NOTE — Telephone Encounter (Signed)
Copied from CRM [id number]. Topic: Clinical - Prescription Issue >> Apr 14, 2023  3:28 PM Suzette B wrote: Reason for CRM: metoprolol tartrate (LOPRESSOR) 25 MG tablet patient stated she would like the listed medication reverted back to other prescription where she was getting 60 pills per prescription due to her heart rate.  She states the pharmacy has been filling it every 15 days to meet the necessary required dosage, but if she doesn't have the 2xs a day dosage the she ends up with major anxiety because of an uncontrolled heart rate. She states if need be please call her at [phone] so that she can explain in further detail. She states a prescription has been sent in to pharmacy today with the incorrect amount once again, and needs it corrected.

## 2023-04-27 ENCOUNTER — Other Ambulatory Visit: Payer: Self-pay | Admitting: Family Medicine

## 2023-04-27 ENCOUNTER — Other Ambulatory Visit: Payer: Self-pay | Admitting: Gastroenterology

## 2023-04-27 DIAGNOSIS — K219 Gastro-esophageal reflux disease without esophagitis: Secondary | ICD-10-CM

## 2023-04-27 NOTE — Telephone Encounter (Signed)
 Unable to provide refills. Patient needs an office visit as previously recommended.

## 2023-04-27 NOTE — Telephone Encounter (Signed)
 Sent patient a Mychart message asking her to contact the office to schedule an appointment

## 2023-05-07 ENCOUNTER — Other Ambulatory Visit: Payer: Self-pay | Admitting: Family Medicine

## 2023-05-09 ENCOUNTER — Other Ambulatory Visit: Payer: Self-pay

## 2023-05-09 MED ORDER — ONDANSETRON HCL 8 MG PO TABS: 8.0000 mg | ORAL_TABLET | Freq: Three times a day (TID) | ORAL | 0 refills | Status: DC | PRN

## 2023-05-28 ENCOUNTER — Other Ambulatory Visit: Payer: Self-pay | Admitting: Family Medicine

## 2023-05-30 ENCOUNTER — Telehealth: Payer: Self-pay | Admitting: Family Medicine

## 2023-05-30 DIAGNOSIS — K219 Gastro-esophageal reflux disease without esophagitis: Secondary | ICD-10-CM

## 2023-05-30 DIAGNOSIS — K5904 Chronic idiopathic constipation: Secondary | ICD-10-CM

## 2023-05-30 NOTE — Telephone Encounter (Signed)
 Copied from CRM [id number]. Topic: Clinical - Medication Refill >> May 30, 2023 11:35 AM Franchot Heidelberg wrote: Most Recent Primary Care Visit:  Provider: Campbell Riches  Department: [facility]  Visit Type: OFFICE VISIT  Date: 01/05/2023  Medication: clobetasol (TEMOVATE) 0.05 % external solution linaclotide (LINZESS) 290 MCG CAPS capsule  dexlansoprazole (DEXILANT) 60 MG capsule   Also states that she is completely out of her current gabapentin (currently pending) she is requesting to have this all refilled.   Pt reports that she needs two bottles for her scalp and her body. Reports that one bottle is just not enough.   Has the patient contacted their pharmacy? Yes (Agent: If no, request that the patient contact the pharmacy for the refill. If patient does not wish to contact the pharmacy document the reason why and proceed with request.) (Agent: If yes, when and what did the pharmacy advise?)  Is this the correct pharmacy for this prescription? Yes If no, delete pharmacy and type the correct one.  This is the patient's preferred pharmacy:  [facility] - [place], [place] - [address] [place] [address] Phone: [phone] Fax: [phone]   Has the prescription been filled recently? Yes  Is the patient out of the medication? Yes  Has the patient been seen for an appointment in the last year OR does the patient have an upcoming appointment? Yes  Can we respond through MyChart? Yes  Agent: Please be advised that Rx refills may take up to 3 business days. We ask that you follow-up with your pharmacy.

## 2023-05-31 NOTE — Telephone Encounter (Signed)
 May have 3 refills on the clobetasol As for the Dexilant and also Linzess that is through gastroenterology she needs to connect with them She also needs a regular follow-up visit somewhere in the next 2 to 3 months I recommend going ahead and scheduling now thank you

## 2023-05-31 NOTE — Telephone Encounter (Unsigned)
 Copied from CRM [id number]. Topic: Clinical - Medication Refill >> May 31, 2023 12:11 PM Eunice Blase wrote: Pt calling regarding Medication: clobetasol (TEMOVATE) 0.05 % external solution. Pt reports that she needs two bottles for her scalp and her body. Reports that one bottle is just not enough. Please call pt at [phone].

## 2023-05-31 NOTE — Telephone Encounter (Signed)
 In all due respect I sent a message stating that it is necessary for her to schedule an office visit once that is scheduled you may send me an follow-up prescription for a total of 2 bottles with 5 refills Whether or not her insurance covers 2 bottles at 1 time will be up to the insurance

## 2023-06-27 ENCOUNTER — Other Ambulatory Visit: Payer: Self-pay | Admitting: Family Medicine

## 2023-06-28 ENCOUNTER — Telehealth: Payer: Self-pay | Admitting: Family Medicine

## 2023-06-28 NOTE — Telephone Encounter (Signed)
 Front staff Please schedule follow-up office visit with me or Jearlean Mince or Orelia Binet somewhere within the next 3 to 4 months thank you

## 2023-07-20 ENCOUNTER — Other Ambulatory Visit: Payer: Self-pay | Admitting: Gastroenterology

## 2023-07-20 DIAGNOSIS — K5904 Chronic idiopathic constipation: Secondary | ICD-10-CM

## 2023-07-26 ENCOUNTER — Other Ambulatory Visit: Payer: Self-pay | Admitting: Family Medicine

## 2023-08-16 ENCOUNTER — Other Ambulatory Visit: Payer: Self-pay | Admitting: Family Medicine

## 2023-08-31 ENCOUNTER — Other Ambulatory Visit: Payer: Self-pay | Admitting: Family Medicine

## 2023-09-02 ENCOUNTER — Other Ambulatory Visit: Payer: Self-pay | Admitting: Family Medicine

## 2023-09-29 ENCOUNTER — Other Ambulatory Visit: Payer: Self-pay | Admitting: Family Medicine

## 2023-09-30 ENCOUNTER — Other Ambulatory Visit: Payer: Self-pay

## 2023-09-30 MED ORDER — FAMOTIDINE 40 MG PO TABS: 40.0000 mg | ORAL_TABLET | Freq: Every day | ORAL | 0 refills | Status: DC

## 2023-09-30 MED ORDER — ALBUTEROL SULFATE HFA 108 (90 BASE) MCG/ACT IN AERS: 1.0000 | INHALATION_SPRAY | Freq: Four times a day (QID) | RESPIRATORY_TRACT | 0 refills | Status: AC | PRN

## 2023-09-30 MED ORDER — METOPROLOL TARTRATE 25 MG PO TABS: ORAL_TABLET | ORAL | 0 refills | Status: DC

## 2023-09-30 MED ORDER — FUROSEMIDE 20 MG PO TABS: ORAL_TABLET | ORAL | 0 refills | Status: DC

## 2023-10-13 ENCOUNTER — Other Ambulatory Visit: Payer: Self-pay | Admitting: Family Medicine

## 2023-10-28 ENCOUNTER — Other Ambulatory Visit: Payer: Self-pay | Admitting: Family Medicine

## 2023-11-02 ENCOUNTER — Ambulatory Visit: Payer: MEDICAID | Admitting: Family Medicine

## 2023-11-29 ENCOUNTER — Other Ambulatory Visit: Payer: Self-pay | Admitting: Family Medicine

## 2023-11-30 ENCOUNTER — Other Ambulatory Visit: Payer: Self-pay | Admitting: Family Medicine

## 2023-11-30 NOTE — Telephone Encounter (Unsigned)
 Copied from CRM [id number]. Topic: Clinical - Prescription Issue >> Nov 30, 2023  4:10 PM Belinda [name] wrote: Reason for CRM: Patient is still waiting for her medication to be sent to pharmacy. She is out of medication. Advised that refills may take up to 3 business days.  Medication: metoprolol  tartrate (LOPRESSOR ) 25 MG tablet  gabapentin  (NEURONTIN ) 300 MG capsule  fluticasone  (FLONASE ) 50 MCG/ACT nasal spray  furosemide  (LASIX ) 20 MG tablet  clobetasol  (TEMOVATE ) 0.05 % external solution  famotidine  (PEPCID ) 40 MG tablet   Pharmacy: [facility] - [place], [place] - [address] [place] [address] Phone: [phone] Fax: [phone] Hours: Not open 24 hours

## 2023-12-01 ENCOUNTER — Other Ambulatory Visit: Payer: Self-pay

## 2023-12-01 ENCOUNTER — Other Ambulatory Visit: Payer: Self-pay | Admitting: Family Medicine

## 2023-12-01 MED ORDER — CLOBETASOL PROPIONATE 0.05 % EX SOLN: CUTANEOUS | 0 refills | Status: DC

## 2023-12-01 MED ORDER — FUROSEMIDE 20 MG PO TABS: ORAL_TABLET | ORAL | 0 refills | Status: DC

## 2023-12-01 MED ORDER — FLUTICASONE PROPIONATE 50 MCG/ACT NA SUSP: 2.0000 | Freq: Every day | NASAL | 3 refills | Status: DC

## 2023-12-01 MED ORDER — GABAPENTIN 300 MG PO CAPS: ORAL_CAPSULE | ORAL | 4 refills | Status: DC

## 2023-12-01 MED ORDER — METOPROLOL TARTRATE 25 MG PO TABS: ORAL_TABLET | ORAL | 0 refills | Status: DC

## 2023-12-28 ENCOUNTER — Other Ambulatory Visit: Payer: Self-pay | Admitting: Family Medicine

## 2023-12-28 DIAGNOSIS — E785 Hyperlipidemia, unspecified: Secondary | ICD-10-CM

## 2023-12-28 DIAGNOSIS — Z79899 Other long term (current) drug therapy: Secondary | ICD-10-CM

## 2024-01-27 ENCOUNTER — Other Ambulatory Visit: Payer: Self-pay | Admitting: Family Medicine

## 2024-02-27 ENCOUNTER — Other Ambulatory Visit: Payer: Self-pay | Admitting: Family Medicine

## 2024-03-15 ENCOUNTER — Telehealth: Payer: MEDICAID | Admitting: Family Medicine

## 2024-03-15 DIAGNOSIS — F419 Anxiety disorder, unspecified: Secondary | ICD-10-CM

## 2024-03-15 MED ORDER — FUROSEMIDE 20 MG PO TABS: ORAL_TABLET | ORAL | 1 refills | Status: AC

## 2024-03-15 MED ORDER — DULOXETINE HCL 60 MG PO CPEP: 60.0000 mg | ORAL_CAPSULE | Freq: Every day | ORAL | 2 refills | Status: AC

## 2024-03-15 MED ORDER — CLOBETASOL PROPIONATE 0.05 % EX SOLN: CUTANEOUS | 2 refills | Status: AC

## 2024-03-15 MED ORDER — POTASSIUM CHLORIDE CRYS ER 10 MEQ PO TBCR: 10.0000 meq | EXTENDED_RELEASE_TABLET | Freq: Two times a day (BID) | ORAL | 2 refills | Status: AC

## 2024-03-15 MED ORDER — BUDESONIDE-FORMOTEROL FUMARATE 80-4.5 MCG/ACT IN AERO: 2.0000 | INHALATION_SPRAY | Freq: Two times a day (BID) | RESPIRATORY_TRACT | 2 refills | Status: AC

## 2024-03-15 MED ORDER — FLUTICASONE PROPIONATE 50 MCG/ACT NA SUSP: 2.0000 | Freq: Every day | NASAL | 12 refills | Status: AC

## 2024-03-15 MED ORDER — GABAPENTIN 300 MG PO CAPS: ORAL_CAPSULE | ORAL | 4 refills | Status: AC

## 2024-03-22 ENCOUNTER — Telehealth: Payer: Self-pay | Admitting: Family Medicine

## 2024-03-22 NOTE — Progress Notes (Signed)
 Virtual Visit via Video Note  I connected with Belinda [name] on 03/22/24 at  2:30 PM EST by a video enabled telemedicine application and verified that I am speaking with the correct person using two identifiers.  Location: Patient: Home Provider: Office   I discussed the limitations of evaluation and management by telemedicine and the availability of in person appointments. The patient expressed understanding and agreed to proceed.  History of Present Illness:    Observations/Objective:   Assessment and Plan:   Follow Up Instructions:    I discussed the assessment and treatment plan with the patient. The patient was provided an opportunity to ask questions and all were answered. The patient agreed with the plan and demonstrated an understanding of the instructions.   The patient was advised to call back or seek an in-person evaluation if the symptoms worsen or if the condition fails to improve as anticipated.  I provided 15 minutes of non-face-to-face time during this encounter.   Belinda Fielding, MD  Patient had virtual visit-video Appears to be in no distress Atraumatic Neuro able to relate and oriented No apparent resp distress Color normal  Anxiety-under the care of specialist they prescribed her Xanax  Previous opioid use disorder under the care of specialist on medication Psoriasis-topicals utilized Patient to do a standard follow-up visit later this spring or early summer in person Gabapentin  helps with her neuropathy refills given she denies drowsiness with it We will do lab work on follow-up visit

## 2024-03-22 NOTE — Telephone Encounter (Signed)
 Patient did virtual visit because of the weather She requested a in person visit This could be springtime through July would be fine work with patient please to set this up-she would prefer to see me thank you

## 2024-03-29 ENCOUNTER — Other Ambulatory Visit: Payer: Self-pay | Admitting: Family Medicine

## 2024-09-14 ENCOUNTER — Ambulatory Visit: Payer: MEDICAID | Admitting: Family Medicine
